# Patient Record
Sex: Female | Born: 1940 | Race: White | Hispanic: No | Smoking: Never smoker
Health system: Southern US, Community
[De-identification: ages and names within clinical notes are randomized; demographics above are authoritative.]

## PROBLEM LIST (undated history)

## (undated) DIAGNOSIS — I10 Essential (primary) hypertension: Secondary | ICD-10-CM

## (undated) DIAGNOSIS — I4891 Unspecified atrial fibrillation: Secondary | ICD-10-CM

## (undated) DIAGNOSIS — R51 Headache: Secondary | ICD-10-CM

## (undated) DIAGNOSIS — T7840XA Allergy, unspecified, initial encounter: Secondary | ICD-10-CM

## (undated) DIAGNOSIS — F0390 Unspecified dementia without behavioral disturbance: Secondary | ICD-10-CM

## (undated) DIAGNOSIS — J302 Other seasonal allergic rhinitis: Secondary | ICD-10-CM

## (undated) DIAGNOSIS — R0600 Dyspnea, unspecified: Secondary | ICD-10-CM

## (undated) DIAGNOSIS — R42 Dizziness and giddiness: Secondary | ICD-10-CM

## (undated) DIAGNOSIS — N393 Stress incontinence (female) (male): Secondary | ICD-10-CM

## (undated) DIAGNOSIS — R600 Localized edema: Secondary | ICD-10-CM

## (undated) DIAGNOSIS — M8580 Other specified disorders of bone density and structure, unspecified site: Secondary | ICD-10-CM

## (undated) DIAGNOSIS — R0609 Other forms of dyspnea: Secondary | ICD-10-CM

## (undated) DIAGNOSIS — R002 Palpitations: Secondary | ICD-10-CM

## (undated) DIAGNOSIS — E785 Hyperlipidemia, unspecified: Secondary | ICD-10-CM

## (undated) DIAGNOSIS — K529 Noninfective gastroenteritis and colitis, unspecified: Secondary | ICD-10-CM

## (undated) DIAGNOSIS — E119 Type 2 diabetes mellitus without complications: Secondary | ICD-10-CM

## (undated) DIAGNOSIS — J449 Chronic obstructive pulmonary disease, unspecified: Secondary | ICD-10-CM

## (undated) HISTORY — DX: Other seasonal allergic rhinitis: J30.2

## (undated) HISTORY — PX: APPENDECTOMY: SHX54

## (undated) HISTORY — PX: UMBILICAL HERNIA REPAIR: SHX196

## (undated) HISTORY — PX: HAMMER TOE SURGERY: SHX385

## (undated) HISTORY — DX: Unspecified atrial fibrillation: I48.91

## (undated) HISTORY — DX: Essential (primary) hypertension: I10

## (undated) HISTORY — DX: Allergy, unspecified, initial encounter: T78.40XA

## (undated) HISTORY — DX: Localized edema: R60.0

## (undated) HISTORY — DX: Other specified disorders of bone density and structure, unspecified site: M85.80

## (undated) HISTORY — DX: Dizziness and giddiness: R42

## (undated) HISTORY — PX: CHOLECYSTECTOMY: SHX55

## (undated) HISTORY — DX: Chronic obstructive pulmonary disease, unspecified: J44.9

## (undated) HISTORY — PX: INCISIONAL HERNIA REPAIR: SHX193

## (undated) HISTORY — DX: Stress incontinence (female) (male): N39.3

## (undated) HISTORY — DX: Palpitations: R00.2

## (undated) HISTORY — DX: Noninfective gastroenteritis and colitis, unspecified: K52.9

---

## 2000-05-02 HISTORY — PX: BREAST BIOPSY: SHX20

## 2000-08-15 ENCOUNTER — Encounter: Payer: Self-pay | Admitting: Family Medicine

## 2000-08-15 ENCOUNTER — Ambulatory Visit (HOSPITAL_COMMUNITY): Admission: RE | Admit: 2000-08-15 | Discharge: 2000-08-15 | Payer: Self-pay | Admitting: Family Medicine

## 2000-08-23 ENCOUNTER — Other Ambulatory Visit: Admission: RE | Admit: 2000-08-23 | Discharge: 2000-08-23 | Payer: Self-pay | Admitting: Neurology

## 2000-08-23 ENCOUNTER — Encounter: Payer: Self-pay | Admitting: Family Medicine

## 2000-08-23 ENCOUNTER — Ambulatory Visit (HOSPITAL_COMMUNITY): Admission: RE | Admit: 2000-08-23 | Discharge: 2000-08-23 | Payer: Self-pay | Admitting: Family Medicine

## 2000-09-19 ENCOUNTER — Ambulatory Visit (HOSPITAL_COMMUNITY): Admission: RE | Admit: 2000-09-19 | Discharge: 2000-09-19 | Payer: Self-pay | Admitting: *Deleted

## 2000-09-19 ENCOUNTER — Encounter: Payer: Self-pay | Admitting: *Deleted

## 2000-11-01 ENCOUNTER — Ambulatory Visit (HOSPITAL_COMMUNITY): Admission: RE | Admit: 2000-11-01 | Discharge: 2000-11-01 | Payer: Self-pay | Admitting: General Surgery

## 2001-02-28 ENCOUNTER — Ambulatory Visit (HOSPITAL_COMMUNITY): Admission: RE | Admit: 2001-02-28 | Discharge: 2001-02-28 | Payer: Self-pay | Admitting: Family Medicine

## 2001-02-28 ENCOUNTER — Encounter: Payer: Self-pay | Admitting: Family Medicine

## 2001-03-05 ENCOUNTER — Ambulatory Visit (HOSPITAL_COMMUNITY): Admission: RE | Admit: 2001-03-05 | Discharge: 2001-03-05 | Payer: Self-pay | Admitting: General Surgery

## 2001-03-05 ENCOUNTER — Encounter: Payer: Self-pay | Admitting: General Surgery

## 2001-08-15 ENCOUNTER — Encounter: Payer: Self-pay | Admitting: Family Medicine

## 2001-08-15 ENCOUNTER — Ambulatory Visit (HOSPITAL_COMMUNITY): Admission: RE | Admit: 2001-08-15 | Discharge: 2001-08-15 | Payer: Self-pay | Admitting: Family Medicine

## 2002-02-07 ENCOUNTER — Encounter: Payer: Self-pay | Admitting: Otolaryngology

## 2002-02-07 ENCOUNTER — Ambulatory Visit (HOSPITAL_COMMUNITY): Admission: RE | Admit: 2002-02-07 | Discharge: 2002-02-07 | Payer: Self-pay | Admitting: Otolaryngology

## 2002-08-22 ENCOUNTER — Ambulatory Visit (HOSPITAL_COMMUNITY): Admission: RE | Admit: 2002-08-22 | Discharge: 2002-08-22 | Payer: Self-pay | Admitting: Family Medicine

## 2002-08-22 ENCOUNTER — Encounter: Payer: Self-pay | Admitting: Family Medicine

## 2003-11-27 ENCOUNTER — Ambulatory Visit (HOSPITAL_COMMUNITY): Admission: RE | Admit: 2003-11-27 | Discharge: 2003-11-27 | Payer: Self-pay | Admitting: Family Medicine

## 2005-02-02 ENCOUNTER — Ambulatory Visit (HOSPITAL_COMMUNITY): Admission: RE | Admit: 2005-02-02 | Discharge: 2005-02-02 | Payer: Self-pay | Admitting: Family Medicine

## 2005-03-30 ENCOUNTER — Ambulatory Visit (HOSPITAL_COMMUNITY): Admission: RE | Admit: 2005-03-30 | Discharge: 2005-03-30 | Payer: Self-pay | Admitting: Family Medicine

## 2005-04-14 ENCOUNTER — Ambulatory Visit: Payer: Self-pay | Admitting: Internal Medicine

## 2005-05-02 HISTORY — PX: COLONOSCOPY: SHX174

## 2005-05-02 HISTORY — PX: KNEE ARTHROSCOPY W/ MENISCAL REPAIR: SHX1877

## 2005-07-19 ENCOUNTER — Ambulatory Visit (HOSPITAL_COMMUNITY): Admission: RE | Admit: 2005-07-19 | Discharge: 2005-07-19 | Payer: Self-pay | Admitting: Internal Medicine

## 2005-07-19 ENCOUNTER — Ambulatory Visit: Payer: Self-pay | Admitting: Internal Medicine

## 2006-04-05 ENCOUNTER — Ambulatory Visit (HOSPITAL_COMMUNITY): Admission: RE | Admit: 2006-04-05 | Discharge: 2006-04-05 | Payer: Self-pay | Admitting: Optometry

## 2006-04-09 ENCOUNTER — Emergency Department (HOSPITAL_COMMUNITY): Admission: EM | Admit: 2006-04-09 | Discharge: 2006-04-09 | Payer: Self-pay | Admitting: Emergency Medicine

## 2006-04-27 ENCOUNTER — Encounter: Admission: RE | Admit: 2006-04-27 | Discharge: 2006-04-27 | Payer: Self-pay | Admitting: Orthopedic Surgery

## 2006-05-03 ENCOUNTER — Ambulatory Visit (HOSPITAL_COMMUNITY): Admission: RE | Admit: 2006-05-03 | Discharge: 2006-05-03 | Payer: Self-pay | Admitting: Obstetrics and Gynecology

## 2006-05-19 ENCOUNTER — Ambulatory Visit (HOSPITAL_BASED_OUTPATIENT_CLINIC_OR_DEPARTMENT_OTHER): Admission: RE | Admit: 2006-05-19 | Discharge: 2006-05-19 | Payer: Self-pay | Admitting: Orthopedic Surgery

## 2006-06-08 ENCOUNTER — Encounter (HOSPITAL_COMMUNITY): Admission: RE | Admit: 2006-06-08 | Discharge: 2006-07-08 | Payer: Self-pay | Admitting: Orthopedic Surgery

## 2006-09-20 ENCOUNTER — Ambulatory Visit (HOSPITAL_BASED_OUTPATIENT_CLINIC_OR_DEPARTMENT_OTHER): Admission: RE | Admit: 2006-09-20 | Discharge: 2006-09-20 | Payer: Self-pay | Admitting: Orthopedic Surgery

## 2006-09-20 HISTORY — DX: Headache: R51

## 2006-09-20 HISTORY — DX: Type 2 diabetes mellitus without complications: E11.9

## 2006-09-20 HISTORY — DX: Other forms of dyspnea: R06.09

## 2006-09-20 HISTORY — DX: Dyspnea, unspecified: R06.00

## 2006-09-20 HISTORY — DX: Hyperlipidemia, unspecified: E78.5

## 2007-05-07 ENCOUNTER — Ambulatory Visit (HOSPITAL_COMMUNITY): Admission: RE | Admit: 2007-05-07 | Discharge: 2007-05-07 | Payer: Self-pay | Admitting: Family Medicine

## 2008-05-16 ENCOUNTER — Ambulatory Visit (HOSPITAL_COMMUNITY): Admission: RE | Admit: 2008-05-16 | Discharge: 2008-05-16 | Payer: Self-pay | Admitting: Family Medicine

## 2009-05-18 ENCOUNTER — Ambulatory Visit (HOSPITAL_COMMUNITY)
Admission: RE | Admit: 2009-05-18 | Discharge: 2009-05-18 | Payer: Self-pay | Source: Home / Self Care | Admitting: Family Medicine

## 2010-05-21 ENCOUNTER — Ambulatory Visit (HOSPITAL_COMMUNITY)
Admission: RE | Admit: 2010-05-21 | Discharge: 2010-05-21 | Payer: Self-pay | Source: Home / Self Care | Attending: Family Medicine | Admitting: Family Medicine

## 2010-05-23 ENCOUNTER — Encounter: Payer: Self-pay | Admitting: Optometry

## 2010-09-14 NOTE — Op Note (Signed)
Christine [name], Christine [name]               ACCOUNT NO.:  [id number]   MEDICAL RECORD NO.:  [id number]          PATIENT TYPE:  AMB   LOCATION:  [facility]                         FACILITY:  [facility]   PHYSICIAN:  Marlowe Kays, M.D.  DATE OF BIRTH:  Aug 01, 1940   DATE OF PROCEDURE:  09/20/2006  DATE OF DISCHARGE:                               OPERATIVE REPORT   PREOPERATIVE DIAGNOSES:  1. Recurrent medial meniscus tear.  2. Osteoarthritis left knee.   POSTOPERATIVE DIAGNOSES:  1. Recurrent medial meniscus tear.  2. Osteoarthritis left knee.   OPERATION:  Left knee arthroscopy with  1. Partial medial meniscectomy.  2. Shaving of medial femoral condyle.  3. Debridement of lateral tibial plateau.   SURGEON:  Marlowe Kays, M.D.   ASSISTANT:  Nurse.   ANESTHESIA:  General.   PATHOLOGY AND JUSTIFICATION FOR PROCEDURE:  She had a history of prior  left knee arthroscopy, she reinjured her left knee and mid April and I  sent her for an MRI on 09/01/2006 demonstrating a stress or  insufficiency fraction of the medial tibia, severe degenerative changes  about the knee worse in the medial compartment and findings compatible  with a retear of residual medial meniscus.  All this was confirmed at  surgery.   PROCEDURE:  Satisfactory general anesthesia, time-out was performed.  Ace wrap to right lower extremity with knee support, left lower  extremity pneumatic tourniquet.  Leg was Esmarched out non sterilely and  thigh stabilizer applied.  Then prepped the leg from stabilizer to ankle  with DuraPrep draped in sterile field. Superior medial saline inflow  first through an anterolateral portal medial compartment knee joint was  evaluated.  She had some wear of the medial femoral condyle which I  gently shaved down the 3.5 shaver.  She had extensive retearing of the  inner portion of the entire posterior curve of the medial meniscus which  I trimmed back to stable rim with baskets and  shaved down  until smooth  with a 3.5 shaver.  Looking out at the medial gutter and suprapatellar  area, patella was worn but did not require shaving.  I then reversed  portals.  Laterally her lateral meniscus was intact.  She had some  disruption of the anterior portion of the lateral tibial plateau which  also initially looked like it might involve the lateral meniscus.  I  debrided this down until smooth with a 3.5 shaver.  Some of the wear  went through all the articular cartilage and the wear seemed to be  entirely related to the lateral tibial plateau.  The joint was then  irrigated to clear and all fluid possible removed.  Two anterior portals  were closed with 4-0 nylon.  I injected 20 mL half percent  Marcaine with adrenalin through the inflow apparatus which I then  removed and closed this portal was 4-0 nylon as well.  Betadine Adaptic  dry sterile dressing were applied.  Tourniquet was released.  She  tolerated the procedure well and was taken recovery satisfactory  condition with no known complications.  ______________________________  Marlowe Kays, M.D.     JA/MEDQ  D:  09/20/2006  T:  09/20/2006  Job:  [id number]

## 2010-09-17 NOTE — H&P (Signed)
[facility]  Patient:    Christine [name], Christine [name] Visit Number: [id number] MRN: [id number]          Service Type: OUT Location: RAD Attending Physician:  Patrica Duel Dictated by:   Franky Macho, M.D. Admit Date:  02/28/2001   CC:         Patrica Duel, M.D.   History and Physical  AGE: 70  CHIEF COMPLAINT:  Abnormal mammogram of the right breast.  HISTORY OF PRESENT ILLNESS:  The patient is a 70 year old white female who was referred for evaluation and treatment of an abnormal mammogram of the right breast.  Microcalcifications which have increased in number are noted in the upper, outer quadrant of the right breast.  No nipple discharge, mass, or family history of breast carcinoma is noted.  She was first pregnant at age 27, with two children.  She did breast feed.  PAST MEDICAL HISTORY 1. Extrinsic allergies. 2. Gastroesophageal reflux disease.  PAST SURGICAL HISTORY 1. Umbilical herniorrhaphy in July 2002. 2. Previous incisional herniorrhaphy.  CURRENT MEDICATIONS 1. Flonase. 2. Vioxx. 3. Prevacid. 4. Allegra.  ALLERGIES:  PENICILLIN AND CODEINE.  REVIEW OF SYSTEMS:  The patient denies drinking or smoking.  She denies any recent asthma problems.  She denies any cardiac difficulties.  PHYSICAL EXAMINATION  GENERAL:  The patient is a well-developed, well-nourished white female, in no acute distress.  VITAL SIGNS:  Afebrile, vital signs stable.  LUNGS:  Clear to auscultation with equal breath sounds bilaterally.  HEART:  Exam reveals a regular rate and rhythm without S3, S4, or murmurs.  ABDOMEN:  Soft, nontender, nondistended.  An umbilical hernia incision site is noted to be well-healed.  IMPRESSION:  Right breast mass, nonpalpable.  PLAN:  The patient is scheduled for a right breast biopsy after needle localization on March 05, 2001.  The risks and benefits of the procedure, including bleeding and infection, were fully  explained to the patient, who gave an informed consent. Dictated by:   Franky Macho, M.D. Attending Physician:  Patrica Duel DD:  03/01/01 TD:  03/01/01 Job: [id number]

## 2010-09-17 NOTE — Consult Note (Signed)
Christine [name], Christine [name]               ACCOUNT NO.:  [id number]   MEDICAL RECORD NO.:  [id number]          PATIENT TYPE:  AMB   LOCATION:                                FACILITY:  [facility]   PHYSICIAN:  R. Roetta Sessions, M.D. DATE OF BIRTH:  1940/11/05   DATE OF CONSULTATION:  04/14/2005  DATE OF DISCHARGE:                                   CONSULTATION   REFERRING PHYSICIAN:  Melvyn Novas, M.D.   REASON FOR CONSULTATION:  Constipation, abnormal barium enema with possible  polyp.   HISTORY OF PRESENT ILLNESS:  Christine [name] is a 70 year old, Caucasian female  with history of chronic constipation.  She recently had a air contrast  barium enema ordered by Dr. Oval Linsey.  She had scattered colonic  diverticulosis.  There was a questionable intraluminal defect in the  descending colon and polyp could not be excluded.  Colonoscopy was  recommended.  She has never had a prior colonoscopy.  She has had chronic  constipation most of her life.  She generally takes Correctol every 3 days  to facilitate bowel movements, otherwise she does not have any bowel  movement.  She denies any melena or rectal bleeding, no abdominal pain,  nausea or vomiting.  She has had mild acid reflux disease for about 5 years.  She has no difficulty swallowing as long as she takes her time to eat.  She  has never had an upper endoscopy.   CURRENT MEDICATIONS:  1.  Naprosyn 500 mg daily.  2.  Crestor 10 mg daily.  3.  Multivitamin daily.  4.  B-Complex daily.  5.  Vitamin A, B and C daily.  6.  Allergy shots every other week.  7.  Correctol every 3 days.  8.  Zantac 75 mg once to twice daily.   ALLERGIES:  CODEINE, PENICILLIN and MORPHINE.   PAST MEDICAL HISTORY:  1.  Arthritis.  2.  Hypercholesterolemia.   ALLERGIES:  MOLD and DUST.   PAST SURGICAL HISTORY:  1.  Cataract extraction bilaterally.  2.  Umbilical hernia repair twice.  3.  Cesarean section twice.  4.  Appendectomy.   FAMILY  HISTORY:  Unavailable.  The patient was an orphan.   SOCIAL HISTORY:  She is widowed.  She has one living child, one deceased  child.  She is employed at [employer].  She has never been a  smoker and no alcohol use.   REVIEW OF SYSTEMS:  GASTROINTESTINAL:  See HPI.  CONSTITUTIONAL:  No weight  loss.  CARDIOPULMONARY:  No chest pain or shortness of breath.   PHYSICAL EXAMINATION:  VITAL SIGNS:  Weight 203 pounds, height 5 feet 6  inches, temperature 97.8, blood pressure 126/76, pulse 68.  GENERAL:  Pleasant, mildly obese, Caucasian female in no acute distress.  SKIN:  Warm and dry, no jaundice.  HEENT:  Pupils equal round and reactive to light.  Conjunctivae are pink.  Sclerae nonicteric.  Oropharyngeal mucosa moist and pink.  No lesions,  erythema or exudate.  No lymphadenopathy or thyromegaly.  CHEST:  Lungs clear to auscultation.  CARDIAC:  Regular rate and rhythm, normal S1, S2, no murmurs, rubs or  gallops.  ABDOMEN:  Positive bowel sounds, soft, nontender, nondistended.  No  organomegaly or masses.  No rebound tenderness or guarding.  No abdominal  pain bruits or hernias.  EXTREMITIES:  No edema.   IMPRESSION:  Christine [name] is a 70 year old lady with a history of chronic  constipation.  Recent barium enema revealed diverticulosis and possible  descending colon polyp.  Colonoscopy is recommended for further evaluation.  I discussed risks, alternatives and benefits with the patient and she is  agreeable to proceed, but would like to wait until after the first of the  year.  In addition, she has chronic gastroesophageal reflux disease.  She  has some mild esophageal dysphagia if she does not take her time to eat.  I  have offered her an upper endoscopy with the possible esophageal dilatation,  but the patient adamant against having this done.   RECOMMENDATIONS:  1.  Colonoscopy some time first of the year, at the patient's request.  2.  Begin MiraLax 17 g daily p.r.n.  x1 refill.  3.  She may continue to use Correctol as needed/as before.   I would like to thank Dr. Oval Linsey for allowing [place] to take part in  the care of this patient.      Tana Coast, P.AJonathon Bellows, M.D.  Electronically Signed    LL/MEDQ  D:  04/14/2005  T:  04/14/2005  Job:  [id number]

## 2010-09-17 NOTE — Op Note (Signed)
Christine [name], Christine [name]               ACCOUNT NO.:  [id number]   MEDICAL RECORD NO.:  [id number]          PATIENT TYPE:  AMB   LOCATION:  [facility]                         [facility]ACILITY:  [facility]   PHYSICIAN:  Marlowe Kays, M.D.  DATE OF BIRTH:  Aug 26, 1940   DATE OF PROCEDURE:  05/19/2006  DATE OF DISCHARGE:                               OPERATIVE REPORT   PREOPERATIVE DIAGNOSES:  1. Torn medial and lateral menisci.  2. Osteoarthritis right knee.   POSTOPERATIVE DIAGNOSES:  1. Torn medial and lateral menisci.  2. Osteoarthritis right knee.   OPERATION:  Right knee arthroscopy with:  1. Partial and medial lateral meniscectomy.  2. Shaving of medial and lateral femoral condyles.   SURGEON:  Dr. Simonne Come   ASSISTANT:  Nurse.   ANESTHESIA:  General.   PATHOLOGY AND JUSTIFICATION FOR PROCEDURE:  She has had painful right  knee with an inability to fully extend the knee for 5 or more months.  The MRI has demonstrated torn medial and lateral menisci, and  consequently she is here today for the above-mentioned surgery.   PROCEDURE:  Satisfactory general anesthesia, pneumatic tourniquet.  Right leg was Esmarched out nonsterilely, thigh stabilizer applied, and  the right leg prepped with DuraPrep from stabilizer to the ankle and  draped in a sterile field.  Knee support and Ace wrap for left lower  extremity.  Superior and medial saline inflow.  First through an  anterolateral portal, the medial compartment of the knee joint was  evaluated.  There was a good bit of synovitis which I resected.  The  anterior third of the medial meniscus did appear to be intact.  She had  chondrocalcinosis involving the medial femoral condyle and the posterior  third of the medial meniscus which had extensive degenerative tearing.  I shaved down the medial meniscus until smooth with the 3.5 shaver and  then debrided back the torn medial meniscus to a stable rim and shaved  it down with 3.5 shaver as well.  I  tried to look up in the medial  gutter and suprapatellar area, but either this was scarring or  osteoarthritis or knee anatomy, I was unable to get into the  suprapatellar pouch.  Consequently, I reversed portals.  Looking  laterally, initially I had a great deal of difficulty seeing any normal-  looking joint.  Initially there appeared to be a free fragment  laterally.  When I was finally able to see my shaver and began cleaning  out the synovium, it became apparent that she had a severe tear along  the lateral meniscus margin with tissue in the intercondylar area which  at first I thought was ACL tear, but then I was able to separate out  this from the ACL, and it became clear that she had actually had a  bucket-handle tear of the lateral meniscus.  I shaved down the grade 2-  3/4 chondromalacia of the lateral femoral condyle with 3.5 shaver and  trimmed back the parent lateral meniscus with a combination of baskets  and arthroscopic scissors and shaving it down until smooth with the 3.5  shaver.  The intercondylar fragment of meniscus I removed with a  combination of basket, scissors, and the shaver as well.  Her ACL was  intact.  Final pictures were taken.  I then tried to get up in the  lateral gutter and suprapatellar area from this approach and was unable  to either.  Consequently, I shifted portals, and I switched the inflow  apparatus in the superior medial area down to the lateral portal and  then used this portal site for the scope.  She had some fairly  significant osteoarthritic spurring on the dorsum of the femur and wear  on the patella which did not appear to be intact and make better  shaving.  The knee joint was then irrigated until clear and all fluid  possible removed.  The superior medial and the anterior medial portals  were closed with 4-0 nylon through the inflow portal which was now the  anterior lateral portal, I injected 20 mL of 0.5% Marcaine with  adrenaline  but no morphine since she was allergic to MORPHINE.  This  portal was then closed with 4-0 nylon as well.  Betadine, Adaptic dry  dressing were applied.  Tourniquet was released.  She tolerated the  procedure well and was taken to the recovery room in satisfactory  condition with no known complications.           ______________________________  Marlowe Kays, M.D.     JA/MEDQ  D:  05/19/2006  T:  05/19/2006  Job:  [id number]

## 2010-09-17 NOTE — Op Note (Signed)
NAMEBREELEY, BISCHOF               ACCOUNT NO.:  [id number]   MEDICAL RECORD NO.:  [id number]          PATIENT TYPE:  AMB   LOCATION:  DAY                           FACILITY:  [facility]   PHYSICIAN:  R. Roetta Sessions, M.D. DATE OF BIRTH:  1941-04-20   DATE OF PROCEDURE:  07/19/2005  DATE OF DISCHARGE:                                 OPERATIVE REPORT   PROCEDURE:  Diagnostic colonoscopy.   INDICATIONS FOR PROCEDURE:  A 70 year old lady who saw Dr. Janna Arch recently  and underwent barium enema. Barium enema indication history reads blood in  stool, but patient says she has not had any Hemoccult testing, nor has she  had any blood per rectum. She is devoid of any GI tract symptoms. She has  never had her lower GI tract imaged prior to the BE. There is no family  history of colorectal neoplasia. BE revealed some scattered diverticula.  There was a questionable intraluminal filling defect in descending colon.  Colonoscopy is now being done. This approach has been discussed with the  patient at length. Potential risks, benefits, and alternatives have been  reviewed and questions answered. She is agreeable. Please see documentation  in the medical record.   PROCEDURE NOTE:  O2 saturation, blood pressure, pulse, and respirations were  monitored throughout the entire procedure. Conscious sedation with Versed  and Demerol in incremental doses.   INSTRUMENT:  Olympus video chip system.   FINDINGS:  Digital rectal exam revealed no abnormalities.   ENDOSCOPIC FINDINGS:  Prep was marginal.   Rectum:  Examination of the rectal mucosa including retroflexed view of the  anal verge revealed no abnormalities.   Colon:  Colonic mucosa was surveyed from the rectosigmoid junction through  the left, transverse, and right colon to the area of the appendiceal  orifice, ileocecal valve, and cecum. These structures were well seen and  photographed for the record. From this level, the scope was slowly  withdrawn, and all previously mentioned mucosal surfaces were again seen.  There was a thin coating of stool throughout the colon which had to be dealt  with. It was difficult to see all of the colonic mucosal surfaces in detail.  Copious washings were performed. The patient had scattered left sided  diverticula. However, the remainder of the colonic mucosa appeared normal.  The patient had a long tortuous colon requiring a number of maneuvers  including changing of the patient's position and external abdominal pressure  to reach the cecum. The patient tolerated the procedure well and was  reactive to endoscopy.   IMPRESSION:  Left sided diverticula. Long tortuous. Otherwise normal  appearing colon. Marginal prep compromised the exam.   RECOMMENDATIONS:  1.  Diverticulosis literature provided to Ms. Llera.  2.  Repeat screening colonoscopy in 10 years. Should the patient experience      any GI symptoms in the future, she is to call me.      Jonathon Bellows, M.D.  Electronically Signed     RMR/MEDQ  D:  07/19/2005  T:  07/20/2005  Job:  [id number]   cc:   Vickey Huger  DonDiego, MD  Fax: [phone]

## 2010-09-17 NOTE — Op Note (Signed)
[facility]  Patient:    Christine [name], Christine [name] Visit Number: [id number] MRN: [id number]          Service Type: DSU Location: DAY Attending Physician:  Dalia Heading Dictated by:   Franky Macho, M.D. Proc. Date: 03/05/01 Admit Date:  03/05/2001   CC:         Patrica Duel, M.D.   Operative Report  PATIENT AGE:  70 years old.  PREOPERATIVE DIAGNOSIS:  Right breast mass, nonpalpable.  POSTOPERATIVE DIAGNOSIS:  Right breast mass, nonpalpable.  OPERATION:  Right breast biopsy after needle localization.  SURGEON:  Franky Macho, M.D.  ANESTHESIA:  MAC.  INDICATIONS:  The patient is a 70 year old white female referred for evaluation and treatment of an abnormal mammogram of the right breast.  The risks and benefits of the procedure including bleeding and infection were fully explained to the patient who gave informed consent.  DESCRIPTION OF PROCEDURE:  The patient was placed in the supine position after undergoing right breast needle localization.  The right breast was prepped and draped using the usual sterile technique with Betadine; 1% Xylocaine was used for local anesthesia.  A lower curvilinear incision was made along the lateral areolar border. Dissection was taken down to the area of the tip of the needle.  The needle along with right breast biopsy area was removed and sent to pathology. Specimen radiography revealed microcalcifications that were suspicious to be within the specimen removed.  Any bleeding was controlled using Bovie electrocautery.  The skin was closed using a 4-0 Vicryl subcuticular suture. Steri-Strips and dry sterile dressings were applied.  All tape and needle counts were correct at the end of the procedure.  The patient was transferred to PACU in stable condition.  COMPLICATIONS:  None.  SPECIMEN:  Right breast biopsy.  ESTIMATED BLOOD LOSS:  Minimal. Dictated by:   Franky Macho, M.D. Attending Physician:   Dalia Heading DD:  03/05/01 TD:  03/06/01 Job: [id number]

## 2011-04-22 ENCOUNTER — Other Ambulatory Visit (HOSPITAL_COMMUNITY): Payer: Self-pay | Admitting: Family Medicine

## 2011-04-22 DIAGNOSIS — Z139 Encounter for screening, unspecified: Secondary | ICD-10-CM

## 2011-05-24 ENCOUNTER — Ambulatory Visit (HOSPITAL_COMMUNITY): Payer: Self-pay

## 2011-05-30 ENCOUNTER — Ambulatory Visit (HOSPITAL_COMMUNITY)
Admission: RE | Admit: 2011-05-30 | Discharge: 2011-05-30 | Disposition: A | Discharge status: Home / Self Care | Payer: Medicare Other | Source: Ambulatory Visit | Attending: Family Medicine | Admitting: Family Medicine

## 2011-05-30 DIAGNOSIS — Z1231 Encounter for screening mammogram for malignant neoplasm of breast: Secondary | ICD-10-CM | POA: Insufficient documentation

## 2011-05-30 DIAGNOSIS — Z139 Encounter for screening, unspecified: Secondary | ICD-10-CM

## 2011-08-01 DIAGNOSIS — I4891 Unspecified atrial fibrillation: Secondary | ICD-10-CM

## 2011-08-01 HISTORY — DX: Unspecified atrial fibrillation: I48.91

## 2011-08-31 ENCOUNTER — Encounter: Payer: Self-pay | Admitting: Cardiology

## 2011-09-01 ENCOUNTER — Encounter: Payer: Self-pay | Admitting: Cardiology

## 2011-09-02 ENCOUNTER — Ambulatory Visit (INDEPENDENT_AMBULATORY_CARE_PROVIDER_SITE_OTHER): Payer: Medicare Other | Admitting: Cardiology

## 2011-09-02 ENCOUNTER — Encounter: Payer: Self-pay | Admitting: Cardiology

## 2011-09-02 ENCOUNTER — Encounter: Payer: Medicare Other | Admitting: Cardiology

## 2011-09-02 VITALS — BP 128/87 | HR 97 | Resp 16 | Ht 65.0 in | Wt 190.0 lb

## 2011-09-02 DIAGNOSIS — E1165 Type 2 diabetes mellitus with hyperglycemia: Secondary | ICD-10-CM

## 2011-09-02 DIAGNOSIS — I4891 Unspecified atrial fibrillation: Secondary | ICD-10-CM

## 2011-09-02 DIAGNOSIS — I10 Essential (primary) hypertension: Secondary | ICD-10-CM

## 2011-09-02 DIAGNOSIS — E785 Hyperlipidemia, unspecified: Secondary | ICD-10-CM | POA: Insufficient documentation

## 2011-09-02 DIAGNOSIS — I5032 Chronic diastolic (congestive) heart failure: Secondary | ICD-10-CM | POA: Insufficient documentation

## 2011-09-02 DIAGNOSIS — E1151 Type 2 diabetes mellitus with diabetic peripheral angiopathy without gangrene: Secondary | ICD-10-CM | POA: Insufficient documentation

## 2011-09-02 MED ORDER — FUROSEMIDE 40 MG PO TABS: 20.0000 mg | ORAL_TABLET | Freq: Every day | ORAL | Status: DC

## 2011-09-02 MED ORDER — RIVAROXABAN 20 MG PO TABS: 1.0000 | ORAL_TABLET | Freq: Every day | ORAL | Status: DC

## 2011-09-02 MED ORDER — DILTIAZEM HCL ER COATED BEADS 120 MG PO CP24: 120.0000 mg | ORAL_CAPSULE | Freq: Every day | ORAL | Status: DC

## 2011-09-02 NOTE — Assessment & Plan Note (Addendum)
Atrial fibrillation is newly recognized, but the duration of her arrhythmia is uncertain.  It is possible that the onset of this arrhythmia was coincident with the onset of her symptoms approximately one month ago.  Heart rate is fairly well controlled due to the fact that she was already treated with a beta blocker.  In light of her current symptoms, a strategy of rate control may not be adequate for this nice woman.  She does not have any impressive findings of congestive heart failure to go along with her arrhythmia.  She does have significant risk for thromboembolism and will also likely undergo cardioversion.  Accordingly, anticoagulation will be initiated with rivaroxaban.  Low-dose diltiazem will be added to better control heart rate.

## 2011-09-02 NOTE — Progress Notes (Signed)
6 Min walk: Start: HR: 95          o2- 97% 2 Min- HR: 96            o2- 96% - HR- 95           o2-95%

## 2011-09-02 NOTE — Assessment & Plan Note (Signed)
Dyspnea on exertion may be related to the presence atrial fibrillation, inadequate control of heart rate or may reflect yet another cause.  Despite the presence of peripheral edema and exertional dyspnea, and mildly elevated BNP level, she does not appear to have significant congestive heart failure.  Diuretic dose will be increased to 40 mg per day to better control peripheral edema.

## 2011-09-02 NOTE — Patient Instructions (Addendum)
Your physician recommends that you schedule a follow-up appointment in: 2 weeks  Your physician has recommended you make the following change in your medication:  1 - START Rivaroxaban 20 mg daily 2 - START Diltiazem 120 mg daily 3 - INCREASE Lasix to 40 mg daily  Your physician recommends that you return for lab work in: Next Friday at [facility] across the street

## 2011-09-02 NOTE — Progress Notes (Signed)
Patient ID: Christine [name], female   DOB: 1940-05-21, 71 y.o.   MRN: [id number]  HPI: Initial cardiology evaluation performed at the kind request of Dr. Loney Hering for evaluation of dyspnea on exertion and peripheral edema.  A recent echocardiogram performed at [facility] was interpreted as showing mild left ventricular systolic dysfunction without significant valvular abnormalities.  Patient reports approximately one month history of progressive dyspnea on exertion and exercise intolerance.  She has noted palpitations at night that resolve spontaneously.  She has experienced no chest discomfort, lightheadedness or syncope.  Current Outpatient Prescriptions on File Prior to Visit  Medication Sig Dispense Refill  . albuterol (PROVENTIL HFA;VENTOLIN HFA) 108 (90 BASE) MCG/ACT inhaler Inhale 2 puffs into the lungs 4 (four) times daily.      Marland Kitchen alendronate (FOSAMAX) 70 MG tablet Take 70 mg by mouth every 7 (seven) days. Take with a full glass of water on an empty stomach.      Marland Kitchen atenolol (TENORMIN) 50 MG tablet Take 50 mg by mouth daily.      . cetirizine (ZYRTEC) 10 MG tablet Take 10 mg by mouth daily.      . cholecalciferol (VITAMIN D) 1000 UNITS tablet Take 1,000 Units by mouth daily.      . citalopram (CELEXA) 20 MG tablet Take 20 mg by mouth daily.      . furosemide (LASIX) 20 MG tablet Take 20 mg by mouth daily.      Marland Kitchen gabapentin (NEURONTIN) 300 MG capsule Take 300 mg by mouth 3 (three) times daily. 1 cap am, noon and 2 caps at bedtime      . glimepiride (AMARYL) 1 MG tablet Take 1 mg by mouth daily before breakfast.      . metFORMIN (GLUCOPHAGE) 500 MG tablet Take 500 mg by mouth daily with breakfast.      . omeprazole (PRILOSEC) 40 MG capsule Take 40 mg by mouth daily.      . pravastatin (PRAVACHOL) 40 MG tablet Take 40 mg by mouth daily.      . solifenacin (VESICARE) 5 MG tablet Take 5 mg by mouth daily.       Allergies  Allergen Reactions  . Ace Inhibitors Cough  . Codeine Hives  .  Morphine And Related Hives  . Penicillins Hives     Past Medical History  Diagnosis Date  . Seasonal allergies   . Headache     twice weekly  . Palpitations   . Hypertension   . Hyperlipidemia   . Vertigo   . Dyspnea on exertion     pedal edema  . Chronic diarrhea     diverticulosis  . Stress incontinence   . Diabetes mellitus, type 2     Diabetic neuropathy  . Bilateral lower extremity edema   . Osteopenia     DEXA scan 01/2010  . Gout     Past Surgical History  Procedure Date  . Cesarean section     x 2  . Appendectomy   . Umbilical hernia repair   . Knee arthroscopy w/ meniscal repair 2007    Bilateral  . Hammer toe surgery     Bilateral hammer toe amputation  . Incisional hernia repair   . Breast biopsy 2002    Right  . Colonoscopy 2007    Family History  Problem Relation Age of Onset  . Adopted: Yes    History   Social History  . Marital Status: Widowed    Spouse Name: N/A  Number of Children: 2  . Years of Education: N/A   Occupational History  . Therapist, sports    Social History Main Topics  . Smoking status: Never Smoker   . Smokeless tobacco: Never Used  . Alcohol Use: No  . Drug Use: No  . Sexually Active: Not on file   Other Topics Concern  . Not on file   Social History Narrative  . No narrative on file    ROS: Substantial psychologic stress at work; intermittent episodes of vertigo; recent headaches, stress incontinence, pedal edema times one month. All other systems reviewed and are negative.  PHYSICAL EXAM: BP 128/87  Pulse 97  Resp 16  Ht 5\' 5"  (1.651 m)  Wt 86.183 kg (190 lb)  BMI 31.62 kg/m2  General-Well-developed; no acute distress Body Habitus-Overweight HEENT-[place]/AT; PERRL; EOM intact; conjunctiva and lids nl Neck-No JVD; no carotid bruits Endocrine-No thyromegaly Lungs-Clear lung fields; resonant percussion; normal I-to-E ratio Cardiovascular- normal PMI; normal S1 and S2; irregular; modest systolic ejection  murmur Abdomen-BS normal; soft and non-tender without masses or organomegaly Musculoskeletal-No deformities, cyanosis or clubbing Neurologic-Nl cranial nerves; symmetric strength and tone Skin- Warm, no significant lesions Extremities-Nl distal pulses; 1+ edema to the mid calf  Echocardiogram:  LV-normal size, no LVH, global hypokinesis with EF of 40-45%.  Markedly enlarged left atrium.  Mild to moderately dilated right ventricle.  Moderate RA enlargement.  AV sclerosis.  Mildly thickened mitral valve with moderate MR.  Mild pulmonary hypertension.  Dilated IVC.  Performed at [facility] and interpreted by Dr. Titus Mould.  EKG: Atrial fibrillation with a ventricular rate of 104, low voltage, Minor nonspecific ST-T wave abnormality in the inferior leads.  No previous tracing for comparison.  ASSESSMENT AND PLAN:   Bing, MD 09/02/2011 3:08 PM

## 2011-09-04 NOTE — Progress Notes (Signed)
This encounter was created in error - please disregard.

## 2011-09-04 NOTE — Assessment & Plan Note (Signed)
Adequately controlled at this visit.

## 2011-09-04 NOTE — Assessment & Plan Note (Signed)
Results of a lipid profile are being sought.

## 2011-09-08 ENCOUNTER — Encounter: Payer: Self-pay | Admitting: *Deleted

## 2011-09-09 LAB — CBC
HCT: 39.9 % (ref 36.0–46.0)
Hemoglobin: 12.7 g/dL (ref 12.0–15.0)
MCH: 29.5 pg (ref 26.0–34.0)
MCHC: 31.8 g/dL (ref 30.0–36.0)
MCV: 92.8 fL (ref 78.0–100.0)
Platelets: 225 10*3/uL (ref 150–400)
RBC: 4.3 MIL/uL (ref 3.87–5.11)
RDW: 13.6 % (ref 11.5–15.5)
WBC: 6.3 10*3/uL (ref 4.0–10.5)

## 2011-09-09 LAB — COMPREHENSIVE METABOLIC PANEL
ALT: 30 U/L (ref 0–35)
AST: 26 U/L (ref 0–37)
BUN: 19 mg/dL (ref 6–23)
CO2: 27 mEq/L (ref 19–32)
Calcium: 9.7 mg/dL (ref 8.4–10.5)
Chloride: 103 mEq/L (ref 96–112)
Creat: 1.02 mg/dL (ref 0.50–1.10)
Glucose, Bld: 145 mg/dL — ABNORMAL HIGH (ref 70–99)
Potassium: 4.7 mEq/L (ref 3.5–5.3)
Sodium: 141 mEq/L (ref 135–145)
Total Protein: 6.9 g/dL (ref 6.0–8.3)

## 2011-09-09 LAB — TSH: TSH: 2.936 u[IU]/mL (ref 0.350–4.500)

## 2011-09-12 ENCOUNTER — Encounter: Payer: Self-pay | Admitting: *Deleted

## 2011-09-21 ENCOUNTER — Ambulatory Visit (INDEPENDENT_AMBULATORY_CARE_PROVIDER_SITE_OTHER): Payer: Medicare Other | Admitting: Cardiology

## 2011-09-21 ENCOUNTER — Encounter: Payer: Self-pay | Admitting: Cardiology

## 2011-09-21 VITALS — BP 130/80 | HR 96 | Ht 65.0 in | Wt 189.0 lb

## 2011-09-21 DIAGNOSIS — R0989 Other specified symptoms and signs involving the circulatory and respiratory systems: Secondary | ICD-10-CM

## 2011-09-21 DIAGNOSIS — I4891 Unspecified atrial fibrillation: Secondary | ICD-10-CM

## 2011-09-21 DIAGNOSIS — I10 Essential (primary) hypertension: Secondary | ICD-10-CM

## 2011-09-21 DIAGNOSIS — E785 Hyperlipidemia, unspecified: Secondary | ICD-10-CM

## 2011-09-21 DIAGNOSIS — Z7901 Long term (current) use of anticoagulants: Secondary | ICD-10-CM

## 2011-09-21 DIAGNOSIS — R0602 Shortness of breath: Secondary | ICD-10-CM

## 2011-09-21 MED ORDER — ATENOLOL 100 MG PO TABS: 100.0000 mg | ORAL_TABLET | Freq: Every day | ORAL | Status: DC

## 2011-09-21 NOTE — Progress Notes (Signed)
Patient ID: Christine [name], female   DOB: 05-23-1940, 71 y.o.   MRN: [id number]  HPI: Scheduled return visit for this very nice woman with newly diagnosed atrial fibrillation.  Since seeing her last week, she is substantially improved, reporting no weakness, malaise or exercise intolerance.  Edema is less, but persists.  She consumes a low-salt diet and maintain leg elevation in her recliner when possible.    Prior to Admission medications   Medication Sig Start Date End Date Taking? Authorizing Provider  albuterol (PROVENTIL HFA;VENTOLIN HFA) 108 (90 BASE) MCG/ACT inhaler Inhale 2 puffs into the lungs 4 (four) times daily.   Yes Historical Provider, MD  alendronate (FOSAMAX) 70 MG tablet Take 70 mg by mouth every 7 (seven) days. Take with a full glass of water on an empty stomach.   Yes Historical Provider, MD  atenolol (TENORMIN) 100 MG tablet Take 1 tablet (100 mg total) by mouth daily. 09/21/11  Yes Kathlen Brunswick, MD  cetirizine (ZYRTEC) 10 MG tablet Take 10 mg by mouth daily.   Yes Historical Provider, MD  cholecalciferol (VITAMIN D) 1000 UNITS tablet Take 1,000 Units by mouth daily.   Yes Historical Provider, MD  citalopram (CELEXA) 20 MG tablet Take 20 mg by mouth daily.   Yes Historical Provider, MD  furosemide (LASIX) 40 MG tablet Take 0.5 tablets (20 mg total) by mouth daily. 09/02/11  Yes Kathlen Brunswick, MD  gabapentin (NEURONTIN) 300 MG capsule Take 300 mg by mouth 3 (three) times daily. 1 cap am, noon and 2 caps at bedtime   Yes Historical Provider, MD  glimepiride (AMARYL) 1 MG tablet Take 1 mg by mouth daily before breakfast.   Yes Historical Provider, MD  metFORMIN (GLUCOPHAGE) 500 MG tablet Take 500 mg by mouth daily with breakfast.   Yes Historical Provider, MD  omeprazole (PRILOSEC) 40 MG capsule Take 40 mg by mouth daily.   Yes Historical Provider, MD  pravastatin (PRAVACHOL) 40 MG tablet Take 40 mg by mouth daily.   Yes Historical Provider, MD  Rivaroxaban 20 MG TABS Take 1  tablet by mouth daily. 09/02/11  Yes Kathlen Brunswick, MD  solifenacin (VESICARE) 5 MG tablet Take 5 mg by mouth daily.   Yes Historical Provider, MD   Allergies  Allergen Reactions  . Ace Inhibitors Cough  . Codeine Hives  . Morphine And Related Hives  . Penicillins Hives     Past medical history, social history, and family history reviewed and updated.  ROS: Denies chest discomfort, palpitations, lightheadedness or syncope.  Sedentary lifestyle without dyspnea on exertion.  All other systems reviewed and are negative.  PHYSICAL EXAM: BP 130/80  Pulse 96  Ht 5\' 5"  (1.651 m)  Wt 85.73 kg (189 lb)  BMI 31.45 kg/m2  SpO2 96%; Weight decreased 1 pound since last visit General-Well developed; no acute distress Body habitus-proportionate weight and height Neck-No JVD; no carotid bruits Lungs-clear lung fields; resonant to percussion Cardiovascular-normal PMI; normal S1 and S2; irregular and rapid; modest early systolic ejection murmur Abdomen-normal bowel sounds; soft and non-tender without masses or organomegaly Musculoskeletal-No deformities, no cyanosis or clubbing Neurologic-Normal cranial nerves; symmetric strength and tone Skin-Warm, no significant lesions Extremities-distal pulses intact; no edema  Rhythm Strip (6 minute walk): Traversed 500 feet and stopped with dyspnea and fatigue.  ASSESSMENT AND PLAN:  Bratenahl Bing, MD 09/21/2011 3:22 PM

## 2011-09-21 NOTE — Progress Notes (Deleted)
Name: Christine [name]    DOB: 26-Jul-1940  Age: 71 y.o.  MR#: [id number]       PCP:  Ernestine Conrad, MD, MD      Insurance: @PAYORNAME @   CC:    Chief Complaint  Patient presents with  . No complaints - Feels much better and edema has improved mar    2 week follow up    VS BP 130/80  Pulse 96  Ht 5\' 5"  (1.651 m)  Wt 189 lb (85.73 kg)  BMI 31.45 kg/m2  SpO2 96%  Weights Current Weight  09/21/11 189 lb (85.73 kg)  09/02/11 190 lb (86.183 kg)    Blood Pressure  BP Readings from Last 3 Encounters:  09/21/11 130/80  09/02/11 128/87     Admit date:  (Not on file) Last encounter with RMR:  09/02/2011   Allergy Allergies  Allergen Reactions  . Ace Inhibitors Cough  . Codeine Hives  . Morphine And Related Hives  . Penicillins Hives    Current Outpatient Prescriptions  Medication Sig Dispense Refill  . albuterol (PROVENTIL HFA;VENTOLIN HFA) 108 (90 BASE) MCG/ACT inhaler Inhale 2 puffs into the lungs 4 (four) times daily.      Marland Kitchen alendronate (FOSAMAX) 70 MG tablet Take 70 mg by mouth every 7 (seven) days. Take with a full glass of water on an empty stomach.      Marland Kitchen atenolol (TENORMIN) 50 MG tablet Take 50 mg by mouth daily.      . cetirizine (ZYRTEC) 10 MG tablet Take 10 mg by mouth daily.      . cholecalciferol (VITAMIN D) 1000 UNITS tablet Take 1,000 Units by mouth daily.      . citalopram (CELEXA) 20 MG tablet Take 20 mg by mouth daily.      Marland Kitchen diltiazem (CARDIZEM CD) 120 MG 24 hr capsule Take 1 capsule (120 mg total) by mouth daily.  30 capsule  12  . furosemide (LASIX) 40 MG tablet Take 0.5 tablets (20 mg total) by mouth daily.  30 tablet  12  . gabapentin (NEURONTIN) 300 MG capsule Take 300 mg by mouth 3 (three) times daily. 1 cap am, noon and 2 caps at bedtime      . glimepiride (AMARYL) 1 MG tablet Take 1 mg by mouth daily before breakfast.      . metFORMIN (GLUCOPHAGE) 500 MG tablet Take 500 mg by mouth daily with breakfast.      . omeprazole (PRILOSEC) 40 MG capsule Take 40  mg by mouth daily.      . pravastatin (PRAVACHOL) 40 MG tablet Take 40 mg by mouth daily.      . Rivaroxaban 20 MG TABS Take 1 tablet by mouth daily.  30 tablet  12  . solifenacin (VESICARE) 5 MG tablet Take 5 mg by mouth daily.        Discontinued Meds:   There are no discontinued medications.  Patient Active Problem List  Diagnoses  . Hypertension  . Hyperlipidemia  . Dyspnea on exertion  . Diabetes mellitus, type 2  . Atrial fibrillation    LABS Office Visit on 09/02/2011  Component Date Value  . TSH 09/09/2011 2.936   . WBC 09/09/2011 6.3   . RBC 09/09/2011 4.30   . Hemoglobin 09/09/2011 12.7   . HCT 09/09/2011 39.9   . MCV 09/09/2011 92.8   . MCH 09/09/2011 29.5   . MCHC 09/09/2011 31.8   . RDW 09/09/2011 13.6   . Platelets 09/09/2011 225   .  Sodium 09/09/2011 141   . Potassium 09/09/2011 4.7   . Chloride 09/09/2011 103   . CO2 09/09/2011 27   . Glucose, Bld 09/09/2011 145*  . BUN 09/09/2011 19   . Creat 09/09/2011 1.02   . Total Bilirubin 09/09/2011 0.5   . Alkaline Phosphatase 09/09/2011 64   . AST 09/09/2011 26   . ALT 09/09/2011 30   . Total Protein 09/09/2011 6.9   . Albumin 09/09/2011 4.0   . Calcium 09/09/2011 9.7      Results for this Opt Visit:     Results for orders placed in visit on 09/02/11  TSH      Component Value Range   TSH 2.936  0.350 - 4.500 (uIU/mL)  CBC      Component Value Range   WBC 6.3  4.0 - 10.5 (K/uL)   RBC 4.30  3.87 - 5.11 (MIL/uL)   Hemoglobin 12.7  12.0 - 15.0 (g/dL)   HCT 16.1  09.6 - 04.5 (%)   MCV 92.8  78.0 - 100.0 (fL)   MCH 29.5  26.0 - 34.0 (pg)   MCHC 31.8  30.0 - 36.0 (g/dL)   RDW 40.9  81.1 - 91.4 (%)   Platelets 225  150 - 400 (K/uL)  COMPREHENSIVE METABOLIC PANEL      Component Value Range   Sodium 141  135 - 145 (mEq/L)   Potassium 4.7  3.5 - 5.3 (mEq/L)   Chloride 103  96 - 112 (mEq/L)   CO2 27  19 - 32 (mEq/L)   Glucose, Bld 145 (*) 70 - 99 (mg/dL)   BUN 19  6 - 23 (mg/dL)   Creat 7.82  9.56 -  1.10 (mg/dL)   Total Bilirubin 0.5  0.3 - 1.2 (mg/dL)   Alkaline Phosphatase 64  39 - 117 (U/L)   AST 26  0 - 37 (U/L)   ALT 30  0 - 35 (U/L)   Total Protein 6.9  6.0 - 8.3 (g/dL)   Albumin 4.0  3.5 - 5.2 (g/dL)   Calcium 9.7  8.4 - 21.3 (mg/dL)    EKG Orders placed in visit on 09/02/11  . EKG 12-LEAD     Prior Assessment and Plan Problem List as of 09/21/2011          Cardiology Problems   Hypertension   Last Assessment & Plan Note   09/02/2011 Office Visit Signed 09/04/2011  7:05 PM by Kathlen Brunswick, MD    Adequately controlled at this visit.    Hyperlipidemia   Last Assessment & Plan Note   09/02/2011 Office Visit Signed 09/04/2011  7:04 PM by Kathlen Brunswick, MD    Results of a lipid profile are being sought.    Atrial fibrillation   Last Assessment & Plan Note   09/02/2011 Office Visit Addendum 09/04/2011  7:03 PM by Kathlen Brunswick, MD    Atrial fibrillation is newly recognized, but the duration of her arrhythmia is uncertain.  It is possible that the onset of this arrhythmia was coincident with the onset of her symptoms approximately one month ago.  Heart rate is fairly well controlled due to the fact that she was already treated with a beta blocker.  In light of her current symptoms, a strategy of rate control may not be adequate for this nice woman.  She does not have any impressive findings of congestive heart failure to go along with her arrhythmia.  She does have significant risk for thromboembolism and will  also likely undergo cardioversion.  Accordingly, anticoagulation will be initiated with rivaroxaban.  Low-dose diltiazem will be added to better control heart rate.      Other   Dyspnea on exertion   Last Assessment & Plan Note   09/02/2011 Office Visit Signed 09/02/2011  7:48 PM by Kathlen Brunswick, MD    Dyspnea on exertion may be related to the presence atrial fibrillation, inadequate control of heart rate or may reflect yet another cause.  Despite the presence of  peripheral edema and exertional dyspnea, and mildly elevated BNP level, she does not appear to have significant congestive heart failure.  Diuretic dose will be increased to 40 mg per day to better control peripheral edema.    Diabetes mellitus, type 2       Imaging: No results found.   FRS Calculation: Score not calculated. Missing: Total Cholesterol, HDL

## 2011-09-21 NOTE — Assessment & Plan Note (Addendum)
PT and PTT will be obtained to document anticoagulant effect.

## 2011-09-21 NOTE — Assessment & Plan Note (Addendum)
Pedal edema is not markedly improved, but patient is more satisfied, and weight has decreased 1 pound.  We will continue furosemide 40 mg per day for now.  Dyspnea on exertion is stable to improved.  A BNP level will be obtained to further assess for possible congestive heart failure.  Patient encouraged to increase her level of activity.

## 2011-09-21 NOTE — Assessment & Plan Note (Addendum)
Patient is symptomatically improved despite having a higher ventricular rate after starting diltiazem than was initially present.  There is a substantial increase in heart rate with exertion, but nothing excessive.  Exercise tolerance is impaired, likely due to physical deconditioning.  Atenolol will be increased to 100 mg per day and diltiazem discontinued.  Patient is advised to walk on a daily basis.  She will return in one week for assessment of heart rate and blood pressure and adjustment of medications by the cardiology nurses and to see me in one month.

## 2011-09-21 NOTE — Assessment & Plan Note (Signed)
Blood pressure has been normal with current medications and should improve further as we Increase the dose of AV nodal blocking agents.

## 2011-09-21 NOTE — Patient Instructions (Signed)
Your physician recommends that you schedule a follow-up appointment in:  1 - 1 month follow up 2 - Check up in 1 week with a rhythm strip  Your physician has recommended you make the following change in your medication:  1 - INCREASE Atenolol to 100 mg daily 2 - STOP Diltiazem  Your physician recommends that you return for lab work in: Today

## 2011-09-21 NOTE — Assessment & Plan Note (Signed)
Fasting lipid profile was ordered, but not performed by the lab.  We will submit another request for that study.

## 2011-09-22 LAB — LIPID PANEL
Cholesterol: 153 mg/dL (ref 0–200)
HDL: 47 mg/dL (ref >39)
LDL Cholesterol: 76 mg/dL (ref 0–99)
Total CHOL/HDL Ratio: 3.3 Ratio
Triglycerides: 148 mg/dL (ref <150)
VLDL: 30 mg/dL (ref 0–40)

## 2011-09-27 ENCOUNTER — Encounter: Payer: Self-pay | Admitting: *Deleted

## 2011-10-25 ENCOUNTER — Other Ambulatory Visit: Payer: Self-pay | Admitting: Cardiology

## 2011-11-03 ENCOUNTER — Emergency Department (HOSPITAL_COMMUNITY): Payer: Medicare Other

## 2011-11-03 ENCOUNTER — Inpatient Hospital Stay (HOSPITAL_COMMUNITY)
Admission: EM | Admit: 2011-11-03 | Discharge: 2011-11-08 | DRG: 308 | Disposition: A | Discharge status: Home / Self Care | Payer: Medicare Other | Attending: Internal Medicine | Admitting: Internal Medicine

## 2011-11-03 ENCOUNTER — Encounter (HOSPITAL_COMMUNITY): Payer: Self-pay | Admitting: Emergency Medicine

## 2011-11-03 DIAGNOSIS — Z7901 Long term (current) use of anticoagulants: Secondary | ICD-10-CM

## 2011-11-03 DIAGNOSIS — R0609 Other forms of dyspnea: Secondary | ICD-10-CM

## 2011-11-03 DIAGNOSIS — I10 Essential (primary) hypertension: Secondary | ICD-10-CM

## 2011-11-03 DIAGNOSIS — Z91199 Patient's noncompliance with other medical treatment and regimen due to unspecified reason: Secondary | ICD-10-CM

## 2011-11-03 DIAGNOSIS — E785 Hyperlipidemia, unspecified: Secondary | ICD-10-CM

## 2011-11-03 DIAGNOSIS — R0600 Dyspnea, unspecified: Secondary | ICD-10-CM

## 2011-11-03 DIAGNOSIS — Z886 Allergy status to analgesic agent status: Secondary | ICD-10-CM

## 2011-11-03 DIAGNOSIS — Z880 Allergy status to penicillin: Secondary | ICD-10-CM

## 2011-11-03 DIAGNOSIS — I5043 Acute on chronic combined systolic (congestive) and diastolic (congestive) heart failure: Secondary | ICD-10-CM

## 2011-11-03 DIAGNOSIS — R0602 Shortness of breath: Secondary | ICD-10-CM

## 2011-11-03 DIAGNOSIS — I4891 Unspecified atrial fibrillation: Principal | ICD-10-CM

## 2011-11-03 DIAGNOSIS — I509 Heart failure, unspecified: Secondary | ICD-10-CM | POA: Diagnosis present

## 2011-11-03 DIAGNOSIS — J811 Chronic pulmonary edema: Secondary | ICD-10-CM

## 2011-11-03 DIAGNOSIS — Z885 Allergy status to narcotic agent status: Secondary | ICD-10-CM

## 2011-11-03 DIAGNOSIS — E1151 Type 2 diabetes mellitus with diabetic peripheral angiopathy without gangrene: Secondary | ICD-10-CM | POA: Diagnosis present

## 2011-11-03 DIAGNOSIS — I5021 Acute systolic (congestive) heart failure: Secondary | ICD-10-CM

## 2011-11-03 DIAGNOSIS — E1149 Type 2 diabetes mellitus with other diabetic neurological complication: Secondary | ICD-10-CM | POA: Diagnosis present

## 2011-11-03 DIAGNOSIS — Z9119 Patient's noncompliance with other medical treatment and regimen: Secondary | ICD-10-CM

## 2011-11-03 DIAGNOSIS — E1142 Type 2 diabetes mellitus with diabetic polyneuropathy: Secondary | ICD-10-CM | POA: Diagnosis present

## 2011-11-03 DIAGNOSIS — E119 Type 2 diabetes mellitus without complications: Secondary | ICD-10-CM

## 2011-11-03 LAB — CBC WITH DIFFERENTIAL/PLATELET
Basophils Absolute: 0 10*3/uL (ref 0.0–0.1)
HCT: 38.2 % (ref 36.0–46.0)
Hemoglobin: 12.3 g/dL (ref 12.0–15.0)
Lymphocytes Relative: 23 % (ref 12–46)
Lymphs Abs: 1.6 10*3/uL (ref 0.7–4.0)
Monocytes Absolute: 0.6 10*3/uL (ref 0.1–1.0)
Monocytes Relative: 10 % (ref 3–12)
Neutro Abs: 4.3 10*3/uL (ref 1.7–7.7)
RBC: 4.17 MIL/uL (ref 3.87–5.11)
RDW: 14.1 % (ref 11.5–15.5)
WBC: 6.7 10*3/uL (ref 4.0–10.5)

## 2011-11-03 LAB — BASIC METABOLIC PANEL
CO2: 25 mEq/L (ref 19–32)
Chloride: 106 mEq/L (ref 96–112)
Creatinine, Ser: 0.71 mg/dL (ref 0.50–1.10)

## 2011-11-03 LAB — PRO B NATRIURETIC PEPTIDE: Pro B Natriuretic peptide (BNP): 6519 pg/mL — ABNORMAL HIGH (ref 0–125)

## 2011-11-03 MED ORDER — DILTIAZEM HCL 25 MG/5ML IV SOLN
10.0000 mg | Freq: Once | INTRAVENOUS | Status: AC
  Administered 2011-11-03: 10 mg via INTRAVENOUS
  Filled 2011-11-03: qty 5

## 2011-11-03 MED ORDER — SODIUM CHLORIDE 0.9 % IV BOLUS (SEPSIS)
250.0000 mL | Freq: Once | INTRAVENOUS | Status: AC
  Administered 2011-11-03: 250 mL via INTRAVENOUS

## 2011-11-03 MED ORDER — ONDANSETRON HCL 4 MG/2ML IJ SOLN
4.0000 mg | Freq: Once | INTRAMUSCULAR | Status: DC
  Filled 2011-11-03: qty 2

## 2011-11-03 MED ORDER — SODIUM CHLORIDE 0.9 % IV SOLN: INTRAVENOUS | Status: DC

## 2011-11-03 MED ORDER — DILTIAZEM HCL 25 MG/5ML IV SOLN: 20.0000 mg | Freq: Once | INTRAVENOUS | Status: DC

## 2011-11-03 MED ORDER — HYDROMORPHONE HCL PF 1 MG/ML IJ SOLN: 1.0000 mg | Freq: Once | INTRAMUSCULAR | Status: DC

## 2011-11-03 MED ORDER — DILTIAZEM HCL 25 MG/5ML IV SOLN
10.0000 mg | Freq: Once | INTRAVENOUS | Status: AC
  Administered 2011-11-03: 10 mg via INTRAVENOUS

## 2011-11-03 NOTE — ED Notes (Signed)
Patient c/o shortness of breath x 4 days; states got worse today.  Patient with pursed lip breathing at triage.  Patient speaking in short words.

## 2011-11-03 NOTE — ED Provider Notes (Addendum)
History  Scribed for Shelda Jakes, MD, the patient was seen in room APA18/APA18. This chart was scribed by Candelaria Stagers. The patient's care started at 9:55 PM   CSN: [id number]  Arrival date & time 11/03/11  2136   None     Chief Complaint  Patient presents with  . Shortness of Breath     The history is provided by the patient.   Christine [name] is a 71 y.o. female who presents to the Emergency Department complaining ofrapid heart rate and SOB that started earlier this morning and has continued all day.  Pt has experienced episodes of Afib before with the last episode about one week ago that lasted about 12 hours.  Pt began seeing Dr. Dietrich Pates May 31 for the rapid heart rate and was put on medication.  She denies nausea, vomiting, abdominal pain, or back pain.  Nothing seems to make the sx better or worse.  She has h/o diabetes.         [facility] PCP Past Medical History  Diagnosis Date  . Seasonal allergies   . Headache     twice weekly  . Palpitations   . Hypertension   . Hyperlipidemia   . Vertigo   . Dyspnea on exertion     pedal edema  . Chronic diarrhea     diverticulosis  . Stress incontinence   . Diabetes mellitus, type 2     Diabetic neuropathy  . Bilateral lower extremity edema   . Osteopenia     DEXA scan 01/2010  . Gout   . Atrial fibrillation 08/2011    First diagnosed in 08/2011; duration of arrhythmia is uncertain    Past Surgical History  Procedure Date  . Cesarean section     x 2  . Appendectomy   . Umbilical hernia repair   . Knee arthroscopy w/ meniscal repair 2007    Bilateral  . Hammer toe surgery     Bilateral hammer toe amputation  . Incisional hernia repair   . Breast biopsy 2002    Right  . Colonoscopy 2007    Family History  Problem Relation Age of Onset  . Adopted: Yes    History  Substance Use Topics  . Smoking status: Never Smoker   . Smokeless tobacco: Never Used  . Alcohol Use: No    OB  History    Grav Para Term Preterm Abortions TAB SAB Ect Mult Living                  Review of Systems  Constitutional: Negative for fever.  HENT: Negative for congestion and sore throat.   Respiratory: Positive for shortness of breath. Negative for cough.   Cardiovascular: Positive for leg swelling. Negative for chest pain.  Gastrointestinal: Negative for nausea, vomiting and abdominal pain.  Musculoskeletal: Negative for back pain.  Skin: Negative for rash.  Neurological: Negative for syncope and headaches.    Allergies  Ace inhibitors; Codeine; Morphine and related; and Penicillins  Home Medications   Current Outpatient Rx  Name Route Sig Dispense Refill  . ATENOLOL 100 MG PO TABS Oral Take 1 tablet (100 mg total) by mouth daily. 30 tablet 12  . CETIRIZINE HCL 10 MG PO TABS Oral Take 10 mg by mouth daily.    Marland Kitchen VITAMIN D 1000 UNITS PO TABS Oral Take 1,000 Units by mouth daily.    Marland Kitchen CITALOPRAM HYDROBROMIDE 20 MG PO TABS Oral Take 20 mg by mouth daily.    Marland Kitchen  FUROSEMIDE 20 MG PO TABS  TAKE 1 TABLET DAILY. 30 tablet 6  . GLIMEPIRIDE 1 MG PO TABS Oral Take 1 mg by mouth daily before breakfast.    . METFORMIN HCL 500 MG PO TABS Oral Take 500 mg by mouth daily with breakfast.    . OMEPRAZOLE 40 MG PO CPDR Oral Take 40 mg by mouth daily.    Marland Kitchen PRAVASTATIN SODIUM 40 MG PO TABS Oral Take 40 mg by mouth every evening.     . ALENDRONATE SODIUM 70 MG PO TABS Oral Take 70 mg by mouth every 7 (seven) days. Take with a full glass of water on an empty stomach.    Marland Kitchen GABAPENTIN 300 MG PO CAPS Oral Take 300 mg by mouth 3 (three) times daily. 1 cap am, noon and 2 caps at bedtime    . RIVAROXABAN 20 MG PO TABS Oral Take 1 tablet by mouth daily. 30 tablet 12  . SOLIFENACIN SUCCINATE 5 MG PO TABS Oral Take 5 mg by mouth daily.      BP 143/110  Pulse 118  Resp 26  SpO2 90%  Physical Exam  Nursing note and vitals reviewed. Constitutional: She is oriented to person, place, and time. She appears  well-developed and well-nourished.  HENT:  Head: Normocephalic and atraumatic.  Eyes: EOM are normal.  Cardiovascular:  No murmur heard.      Heart rate fast and irregular.   Pulmonary/Chest: Breath sounds normal. She has no wheezes. She has no rales.  Abdominal: Bowel sounds are normal. There is no tenderness.  Musculoskeletal: She exhibits edema (2+ edema in legs bilaterally).  Lymphadenopathy:    She has no cervical adenopathy.  Neurological: She is alert and oriented to person, place, and time. No cranial nerve deficit.  Skin: Skin is warm and dry.  Psychiatric: She has a normal mood and affect. Her behavior is normal.    ED Course  Procedures  DIAGNOSTIC STUDIES: Oxygen Saturation is 90% on room air, low by my interpretation.    COORDINATION OF CARE:  10:08PM Ordered: DG Chest Port 1 View; CBC with Differential; Basic metabolic panel; Pro b natriuretic peptide Results for orders placed during the hospital encounter of 11/03/11  CBC WITH DIFFERENTIAL      Component Value Range   WBC 6.7  4.0 - 10.5 K/uL   RBC 4.17  3.87 - 5.11 MIL/uL   Hemoglobin 12.3  12.0 - 15.0 g/dL   HCT 16.1  09.6 - 04.5 %   MCV 91.6  78.0 - 100.0 fL   MCH 29.5  26.0 - 34.0 pg   MCHC 32.2  30.0 - 36.0 g/dL   RDW 40.9  81.1 - 91.4 %   Platelets 179  150 - 400 K/uL   Neutrophils Relative 65  43 - 77 %   Neutro Abs 4.3  1.7 - 7.7 K/uL   Lymphocytes Relative 23  12 - 46 %   Lymphs Abs 1.6  0.7 - 4.0 K/uL   Monocytes Relative 10  3 - 12 %   Monocytes Absolute 0.6  0.1 - 1.0 K/uL   Eosinophils Relative 2  0 - 5 %   Eosinophils Absolute 0.2  0.0 - 0.7 K/uL   Basophils Relative 0  0 - 1 %   Basophils Absolute 0.0  0.0 - 0.1 K/uL  BASIC METABOLIC PANEL      Component Value Range   Sodium 141  135 - 145 mEq/L   Potassium 3.5  3.5 -  5.1 mEq/L   Chloride 106  96 - 112 mEq/L   CO2 25  19 - 32 mEq/L   Glucose, Bld 131 (*) 70 - 99 mg/dL   BUN 18  6 - 23 mg/dL   Creatinine, Ser 1.61  0.50 - 1.10 mg/dL    Calcium 9.4  8.4 - 09.6 mg/dL   GFR calc non Af Amer 85 (*) >90 mL/min   GFR calc Af Amer >90  >90 mL/min  PRO B NATRIURETIC PEPTIDE      Component Value Range   Pro B Natriuretic peptide (BNP) 6519.0 (*) 0 - 125 pg/mL     Labs Reviewed  BASIC METABOLIC PANEL - Abnormal; Notable for the following:    Glucose, Bld 131 (*)     GFR calc non Af Amer 85 (*)     All other components within normal limits  PRO B NATRIURETIC PEPTIDE - Abnormal; Notable for the following:    Pro B Natriuretic peptide (BNP) 6519.0 (*)     All other components within normal limits  CBC WITH DIFFERENTIAL   Dg Chest Port 1 View  11/03/2011  *RADIOLOGY REPORT*  Clinical Data: Shortness of breath  PORTABLE CHEST - 1 VIEW  Comparison: 08/03/2011  Findings: Prominent cardiomediastinal contours.  Pericardial effusion is suggested.  Bibasilar opacities.  Central vascular congestion and perihilar opacities.  Kerley B lines.  Small effusions not excluded.  Osteopenia.  No acute osseous finding.  IMPRESSION: Prominent cardiomediastinal contours with pulmonary edema pattern.  Question pericardial effusion.  Bilateral opacities; atelectasis versus infiltrate.  Original Report Authenticated By: Waneta Martins, M.D.    Date: 11/03/2011  Rate: 155  Rhythm: atrial fibrillation  QRS Axis: normal  Intervals: normal  ST/T Wave abnormalities: normal  Conduction Disutrbances:none  Narrative Interpretation:   Old EKG Reviewed: none available    1. Atrial fibrillation with rapid ventricular response   2. Shortness of breath     CRITICAL CARE Performed by: Shelda Jakes.   Total critical care time: 30  Critical care time was exclusive of separately billable procedures and treating other patients.  Critical care was necessary to treat or prevent imminent or life-threatening deterioration.  Critical care was time spent personally by me on the following activities: development of treatment plan with patient and/or  surrogate as well as nursing, discussions with consultants, evaluation of patient's response to treatment, examination of patient, obtaining history from patient or surrogate, ordering and performing treatments and interventions, ordering and review of laboratory studies, ordering and review of radiographic studies, pulse oximetry and re-evaluation of patient's condition.   MDM   Patient presented with rapid heart rate all day long associated shortness of breath mild chest discomfort. Upon presentation she was in rapid atrial fibrillation with a heart rate around 155 oxygen saturation on 2 L was in the mid 90s. Patient was treated with 10 mg of diltiazem and IV push with some improvement in heart rate down around 120 to give another 10 mg of diltiazem IV approach heart rate came down to 1 away but never converted gastric tobacco. Range is now from 115 to 125 blood pressure dropped with the medication down to systolic around 95. Chest x-ray shows a little bit of evidence of some interstitial edema some questionable pericardial effusion. Patient is more comfortable off oxygen but feels better with it on. Will contact the hospitalist for a [facility] since her heart rate is still above 100 and blood pressures are marginal. We'll discuss with them starting a  diltiazem drip.  [facility] come see the patient in the emergency department and make final disposition.     I personally performed the services described in this documentation, which was scribed in my presence. The recorded information has been reviewed and considered.          Shelda Jakes, MD 11/03/11 2356  Shelda Jakes, MD 11/04/11 [phone]

## 2011-11-04 ENCOUNTER — Encounter (HOSPITAL_COMMUNITY): Payer: Self-pay | Admitting: Internal Medicine

## 2011-11-04 DIAGNOSIS — I5043 Acute on chronic combined systolic (congestive) and diastolic (congestive) heart failure: Secondary | ICD-10-CM | POA: Diagnosis present

## 2011-11-04 DIAGNOSIS — Z7901 Long term (current) use of anticoagulants: Secondary | ICD-10-CM

## 2011-11-04 DIAGNOSIS — I4891 Unspecified atrial fibrillation: Secondary | ICD-10-CM

## 2011-11-04 DIAGNOSIS — E785 Hyperlipidemia, unspecified: Secondary | ICD-10-CM

## 2011-11-04 DIAGNOSIS — I509 Heart failure, unspecified: Secondary | ICD-10-CM

## 2011-11-04 DIAGNOSIS — E119 Type 2 diabetes mellitus without complications: Secondary | ICD-10-CM

## 2011-11-04 DIAGNOSIS — J811 Chronic pulmonary edema: Secondary | ICD-10-CM | POA: Diagnosis present

## 2011-11-04 DIAGNOSIS — I5021 Acute systolic (congestive) heart failure: Secondary | ICD-10-CM

## 2011-11-04 DIAGNOSIS — I10 Essential (primary) hypertension: Secondary | ICD-10-CM

## 2011-11-04 LAB — CARDIAC PANEL(CRET KIN+CKTOT+MB+TROPI)
CK, MB: 2.4 ng/mL (ref 0.3–4.0)
CK, MB: 1.8 ng/mL (ref 0.3–4.0)
Total CK: 48 U/L (ref 7–177)
Troponin I: <0.3 ng/mL (ref <0.30)
Troponin I: <0.3 ng/mL (ref <0.30)

## 2011-11-04 LAB — GLUCOSE, CAPILLARY
Glucose-Capillary: 151 mg/dL — ABNORMAL HIGH (ref 70–99)
Glucose-Capillary: 127 mg/dL — ABNORMAL HIGH (ref 70–99)

## 2011-11-04 LAB — CBC
HCT: 38.9 % (ref 36.0–46.0)
Hemoglobin: 12.6 g/dL (ref 12.0–15.0)
RBC: 4.24 MIL/uL (ref 3.87–5.11)
RDW: 14.1 % (ref 11.5–15.5)
WBC: 7.3 10*3/uL (ref 4.0–10.5)

## 2011-11-04 LAB — BASIC METABOLIC PANEL
CO2: 27 mEq/L (ref 19–32)
Calcium: 9 mg/dL (ref 8.4–10.5)
Creatinine, Ser: 1 mg/dL (ref 0.50–1.10)
GFR calc Af Amer: 64 mL/min — ABNORMAL LOW (ref >90)
GFR calc non Af Amer: 55 mL/min — ABNORMAL LOW (ref >90)
Sodium: 137 mEq/L (ref 135–145)

## 2011-11-04 LAB — COMPREHENSIVE METABOLIC PANEL
BUN: 16 mg/dL (ref 6–23)
Calcium: 9.4 mg/dL (ref 8.4–10.5)
Creatinine, Ser: 0.71 mg/dL (ref 0.50–1.10)
GFR calc Af Amer: >90 mL/min (ref >90)
Glucose, Bld: 171 mg/dL — ABNORMAL HIGH (ref 70–99)
Total Protein: 7.1 g/dL (ref 6.0–8.3)

## 2011-11-04 LAB — MAGNESIUM: Magnesium: 1.8 mg/dL (ref 1.5–2.5)

## 2011-11-04 LAB — MRSA PCR SCREENING: MRSA by PCR: NEGATIVE

## 2011-11-04 LAB — HEMOGLOBIN A1C: Mean Plasma Glucose: 146 mg/dL — ABNORMAL HIGH (ref <117)

## 2011-11-04 MED ORDER — RIVAROXABAN 10 MG PO TABS
20.0000 mg | ORAL_TABLET | Freq: Every day | ORAL | Status: DC
  Administered 2011-11-04 – 2011-11-08 (×5): 20 mg via ORAL
  Filled 2011-11-04: qty 2
  Filled 2011-11-04: qty 1
  Filled 2011-11-04: qty 2
  Filled 2011-11-04 (×3): qty 1
  Filled 2011-11-04: qty 2

## 2011-11-04 MED ORDER — SODIUM CHLORIDE 0.9 % IJ SOLN
3.0000 mL | Freq: Two times a day (BID) | INTRAMUSCULAR | Status: DC
  Administered 2011-11-04 – 2011-11-06 (×4): 3 mL via INTRAVENOUS
  Filled 2011-11-04: qty 3

## 2011-11-04 MED ORDER — POTASSIUM CHLORIDE CRYS ER 20 MEQ PO TBCR
40.0000 meq | EXTENDED_RELEASE_TABLET | Freq: Two times a day (BID) | ORAL | Status: DC
  Administered 2011-11-04 – 2011-11-08 (×9): 40 meq via ORAL
  Filled 2011-11-04: qty 2
  Filled 2011-11-04: qty 1
  Filled 2011-11-04 (×4): qty 2
  Filled 2011-11-04: qty 1
  Filled 2011-11-04: qty 2
  Filled 2011-11-04: qty 1
  Filled 2011-11-04: qty 2

## 2011-11-04 MED ORDER — DILTIAZEM HCL 100 MG IV SOLR
5.0000 mg/h | INTRAVENOUS | Status: DC
  Filled 2011-11-04: qty 100

## 2011-11-04 MED ORDER — INSULIN ASPART 100 UNIT/ML ~~LOC~~ SOLN
0.0000 [IU] | Freq: Three times a day (TID) | SUBCUTANEOUS | Status: DC
  Administered 2011-11-04: 2 [IU] via SUBCUTANEOUS
  Administered 2011-11-04: 3 [IU] via SUBCUTANEOUS
  Administered 2011-11-05: 2 [IU] via SUBCUTANEOUS
  Administered 2011-11-07: 5 [IU] via SUBCUTANEOUS
  Administered 2011-11-07: 2 [IU] via SUBCUTANEOUS
  Administered 2011-11-08: 3 [IU] via SUBCUTANEOUS
  Administered 2011-11-08: 2 [IU] via SUBCUTANEOUS

## 2011-11-04 MED ORDER — ACETAMINOPHEN 650 MG RE SUPP: 650.0000 mg | Freq: Four times a day (QID) | RECTAL | Status: DC | PRN

## 2011-11-04 MED ORDER — DILTIAZEM HCL 100 MG IV SOLR
20.0000 mg/h | INTRAVENOUS | Status: DC
  Administered 2011-11-04 (×2): 10 mg/h via INTRAVENOUS
  Filled 2011-11-04: qty 100

## 2011-11-04 MED ORDER — SODIUM CHLORIDE 0.9 % IV BOLUS (SEPSIS)
250.0000 mL | Freq: Once | INTRAVENOUS | Status: AC
  Administered 2011-11-04: 250 mL via INTRAVENOUS

## 2011-11-04 MED ORDER — FUROSEMIDE 10 MG/ML IJ SOLN
40.0000 mg | Freq: Two times a day (BID) | INTRAMUSCULAR | Status: DC
  Administered 2011-11-04 – 2011-11-07 (×8): 40 mg via INTRAVENOUS
  Filled 2011-11-04 (×8): qty 4

## 2011-11-04 MED ORDER — ATENOLOL 25 MG PO TABS
50.0000 mg | ORAL_TABLET | Freq: Every day | ORAL | Status: DC
  Administered 2011-11-04 – 2011-11-05 (×2): 50 mg via ORAL
  Filled 2011-11-04 (×2): qty 2

## 2011-11-04 MED ORDER — FUROSEMIDE 10 MG/ML IJ SOLN
40.0000 mg | Freq: Once | INTRAMUSCULAR | Status: AC
  Administered 2011-11-04: 40 mg via INTRAVENOUS
  Filled 2011-11-04: qty 4

## 2011-11-04 MED ORDER — SIMVASTATIN 20 MG PO TABS
20.0000 mg | ORAL_TABLET | Freq: Every day | ORAL | Status: DC
  Administered 2011-11-04: 20 mg via ORAL
  Filled 2011-11-04: qty 1

## 2011-11-04 MED ORDER — DARIFENACIN HYDROBROMIDE ER 7.5 MG PO TB24
7.5000 mg | ORAL_TABLET | Freq: Every day | ORAL | Status: DC
  Administered 2011-11-04 – 2011-11-08 (×5): 7.5 mg via ORAL
  Filled 2011-11-04 (×7): qty 1

## 2011-11-04 MED ORDER — ATENOLOL 25 MG PO TABS: 25.0000 mg | ORAL_TABLET | Freq: Every day | ORAL | Status: DC

## 2011-11-04 MED ORDER — ONDANSETRON HCL 4 MG/2ML IJ SOLN: 4.0000 mg | Freq: Four times a day (QID) | INTRAMUSCULAR | Status: DC | PRN

## 2011-11-04 MED ORDER — CITALOPRAM HYDROBROMIDE 20 MG PO TABS
20.0000 mg | ORAL_TABLET | Freq: Every day | ORAL | Status: DC
  Administered 2011-11-04 – 2011-11-08 (×5): 20 mg via ORAL
  Filled 2011-11-04 (×5): qty 1

## 2011-11-04 MED ORDER — DILTIAZEM LOAD VIA INFUSION
10.0000 mg | Freq: Once | INTRAVENOUS | Status: AC | PRN
  Filled 2011-11-04: qty 10

## 2011-11-04 MED ORDER — ONDANSETRON HCL 4 MG PO TABS: 4.0000 mg | ORAL_TABLET | Freq: Four times a day (QID) | ORAL | Status: DC | PRN

## 2011-11-04 MED ORDER — GABAPENTIN 300 MG PO CAPS
300.0000 mg | ORAL_CAPSULE | Freq: Three times a day (TID) | ORAL | Status: DC
  Administered 2011-11-04 – 2011-11-08 (×13): 300 mg via ORAL
  Filled 2011-11-04 (×13): qty 1

## 2011-11-04 MED ORDER — ACETAMINOPHEN 325 MG PO TABS
650.0000 mg | ORAL_TABLET | Freq: Four times a day (QID) | ORAL | Status: DC | PRN
  Administered 2011-11-04: 650 mg via ORAL
  Filled 2011-11-04: qty 2

## 2011-11-04 MED ORDER — SODIUM CHLORIDE 0.9 % IJ SOLN
3.0000 mL | Freq: Two times a day (BID) | INTRAMUSCULAR | Status: DC
  Administered 2011-11-06 – 2011-11-07 (×3): 3 mL via INTRAVENOUS
  Filled 2011-11-04 (×3): qty 3

## 2011-11-04 NOTE — Progress Notes (Signed)
Patient ID: Christine [name], female   DOB: [birth year], 71 y.o.   MRN: [id number]   CARDIOLOGY CONSULT NOTE  Patient ID: Christine [name] MRN: [id number], DOB/AGE: 17-Oct-1940   Admit date: 11/03/2011 Date of Consult: 11/04/2011   Primary Physician: Ernestine Conrad, MD Primary Cardiologist: Bensley Bing MD  Pt. Profile Mrs Christine [name] is a 71 year old white female well known to our office who comes in with rapid atrial fibrillation and pulmonary edema.  Problem List  Past Medical History  Diagnosis Date  . Seasonal allergies   . Headache     twice weekly  . Palpitations   . Hypertension   . Hyperlipidemia   . Vertigo   . Dyspnea on exertion     pedal edema  . Chronic diarrhea     diverticulosis  . Stress incontinence   . Diabetes mellitus, type 2     Diabetic neuropathy  . Bilateral lower extremity edema   . Osteopenia     DEXA scan 01/2010  . Gout   . Atrial fibrillation 08/2011    First diagnosed in 08/2011; duration of arrhythmia is uncertain    Past Surgical History  Procedure Date  . Cesarean section     x 2  . Appendectomy   . Umbilical hernia repair   . Knee arthroscopy w/ meniscal repair 2007    Bilateral  . Hammer toe surgery     Bilateral hammer toe amputation  . Incisional hernia repair   . Breast biopsy 2002    Right  . Colonoscopy 2007     Allergies  Allergies  Allergen Reactions  . Ace Inhibitors Cough  . Codeine Hives  . Morphine And Related Hives  . Penicillins Hives    HPI  She awoke at 6 AM and was very short of breath. She felt her heart pounding. According to the chart, she was newly diagnosed with A. fib with a rapid rate April of this year. Echocardiogram at that time showed an ejection fraction 40-45%, severe left leg enlargement, moderate dilated LV, moderate right atrial enlargement, aortic sclerosis with no stenosis, mildly thickened mitral valve with moderate mitral regurgitation, mild pulmonary hypertension.  Control with atenolol 100  mg a day and anticoagulation.  When interviewed, she has no idea what she takes at home. Her medicines here with her. Told to take something extra he got in trouble but can't remember what it was. I suspect it was a beta blocker.  Initial evaluation shows pulmonary edema and cardiomegaly on her chest x-ray. EKG showed A. fib with a rapid rate no acute changes. Laboratory data was remarkable for hypokalemia. She has been on Lasix for peripheral edema.  Prior to this, she denies any orthopnea, PND, chest pain or palpitations.  Inpatient Medications     . atenolol  25 mg Oral Daily  . citalopram  20 mg Oral Daily  . darifenacin  7.5 mg Oral Daily  . diltiazem  10 mg Intravenous Once  . diltiazem  10 mg Intravenous Once  . furosemide  40 mg Intravenous Once  . furosemide  40 mg Intravenous Q12H  . gabapentin  300 mg Oral TID  . insulin aspart  0-15 Units Subcutaneous TID WC  . Rivaroxaban  20 mg Oral Daily  . simvastatin  20 mg Oral q1800  . sodium chloride  250 mL Intravenous Once  . sodium chloride  3 mL Intravenous Q12H  . sodium chloride  3 mL Intravenous Q12H  . DISCONTD: diltiazem  20 mg  Intravenous Once  . DISCONTD: HYDROmorphone  1 mg Intravenous Once  . DISCONTD: ondansetron  4 mg Intravenous Once    Family History Family History  Problem Relation Age of Onset  . Adopted: Yes     Social History History   Social History  . Marital Status: Widowed    Spouse Name: N/A    Number of Children: 2  . Years of Education: N/A   Occupational History  . Therapist, sports    Social History Main Topics  . Smoking status: Never Smoker   . Smokeless tobacco: Never Used  . Alcohol Use: No  . Drug Use: No  . Sexually Active: Not on file   Other Topics Concern  . Not on file   Social History Narrative  . No narrative on file     Review of Systems  General:  No chills, fever, night sweats or weight changes.  Cardiovascular:  No chest pain, dyspnea on exertion,   orthopnea, palpitations,  Dermatological: No rash, lesions/masses Respiratory: No cough, dyspnea Urologic: No hematuria, dysuria Abdominal:   No nausea, vomiting, diarrhea, bright red blood per rectum, melena, or hematemesis Neurologic:  No visual changes, wkns, changes in mental status. All other systems reviewed and are otherwise negative except as noted above.  Physical Exam  Blood pressure 125/79, pulse 110, temperature 97.6 F (36.4 C), temperature source Oral, resp. rate 22, height 5\' 4"  (1.626 m), weight 195 lb 5.2 oz (88.6 kg), SpO2 100.00%.  General: Pleasant, NAD, dyspneic Psych: Normal affect. Neuro: Alert and oriented X 3. Moves all extremities spontaneously. HEENT: Normal  Neck: Supple without bruits or JVD. Lungs:  Resp regular and unlabored, CTA. Heart: IRRR Abdomen: Soft, non-tender, non-distended, BS + x 4.  Extremities: No clubbing, cyanosis, 1+ pitting edema of lower extremities.Marland Kitchen DP/PT/Radials 2+ and equal bilaterally.  Labs   [facility] 11/04/11 0204  CKTOTAL 76  CKMB 2.8  TROPONINI <0.30   Lab Results  Component Value Date   WBC 7.3 11/04/2011   HGB 12.6 11/04/2011   HCT 38.9 11/04/2011   MCV 91.7 11/04/2011   PLT 185 11/04/2011    Lab 11/04/11 0204  NA 139  K 3.4*  CL 102  CO2 28  BUN 16  CREATININE 0.71  CALCIUM 9.4  PROT 7.1  BILITOT 0.7  ALKPHOS 67  ALT 79*  AST 75*  GLUCOSE 171*   Lab Results  Component Value Date   CHOL 153 09/21/2011   HDL 47 09/21/2011   LDLCALC 76 09/21/2011   TRIG 148 09/21/2011   No results found for this basename: DDIMER    Radiology/Studies  Dg Chest Port 1 View  11/03/2011  *RADIOLOGY REPORT*  Clinical Data: Shortness of breath  PORTABLE CHEST - 1 VIEW  Comparison: 08/03/2011  Findings: Prominent cardiomediastinal contours.  Pericardial effusion is suggested.  Bibasilar opacities.  Central vascular congestion and perihilar opacities.  Kerley B lines.  Small effusions not excluded.  Osteopenia.  No acute osseous  finding.  IMPRESSION: Prominent cardiomediastinal contours with pulmonary edema pattern.  Question pericardial effusion.  Bilateral opacities; atelectasis versus infiltrate.  Original Report Authenticated By: Waneta Martins, M.D.    ECG Atrial fibrillation with rapid ventricular rate  ASSESSMENT AND PLAN #1 chronic A. fib controlled with atenolol 100 mg a day and anticoagulation. He presents with rapid atrial fib with pulmonary edema which was acute event. Is not clear if she knows what to it this occurs in the future nor does she seem to know  her medicines . Long discussion with her and the family.  I've increased her diltiazem to 20 mg per hour continuously, potassium supplement to achieve a level of greater than 4, and continue diuresis. She's been out 2200 since 3 AM. Cancel echo since just done in April of this year.  I have asked her family to bring her medications to the hospital. We have carefully reviewed these with her family and the patient. She is to weigh herself every day take extra Lasix if her weight goes up more than 3 pounds. It should be accompanied by extra potassium supplementation. She also should be instructed on taking extra beta blocker if she feels her heart rate is increased.   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

## 2011-11-04 NOTE — Progress Notes (Signed)
Patient admitted earlier today by Dr. Rito Ehrlich  Patient seen and examined, database reviewed  She was admitted for atrial fibrillation with rapid ventricular response and also pulmonary edema.  She has chronic atrial fibrillation and mild systolic dysfunction on her previous echo.  Cardiac markers thus far have been negative.   She was started on furosemide and has diuresed a couple of liters.  She is starting to feel better.  She was seen by cardiology with adjustments to her medications.  Hopefully we can keep her rate under control with beta blockers, as calcium channel blockers would not be ideal with her systolic dysfunction. Digoxin would be another option. She is continued on diltiazem infusion at this point, and this will be weaned off as tolerated. She is anticoagulated with xarelto. She will remain in the SDU for now.

## 2011-11-04 NOTE — Progress Notes (Signed)
Notified MD of 19 beat run of v-tach.

## 2011-11-04 NOTE — Evaluation (Signed)
Physical Therapy Evaluation Patient Details Name: Christine [name] MRN: [id number] DOB: 1941/01/14 Today's Date: 11/04/2011 Time: 4098-1191 PT Time Calculation (min): 30 min  PT Assessment / Plan / Recommendation Clinical Impression  Pt functional limitations are cardiac/pulmonary in nature.  Pt exhibits functional ROM/Strength of LE but decreased functional mobility due to cardiac pulmonary issues.  Pt will benefit from skilled therapy for energy conservation techniques and to improve functional mobility.    PT Assessment  Patient needs continued PT services    Follow Up Recommendations  No PT follow up    Barriers to Discharge None      Equipment Recommendations  None recommended by PT    Recommendations for Other Services   OT-energy conservation techniques  Frequency Min 3X/week    Precautions / Restrictions Precautions Precautions: None Restrictions Weight Bearing Restrictions: No         Mobility  Bed Mobility Bed Mobility: Supine to Sit;Sitting - Scoot to Edge of Bed Supine to Sit: 7: Independent Sitting - Scoot to [place] of Bed: 7: Independent Transfers Transfers: Sit to Stand Sit to Stand: 6: Modified independent (Device/Increase time) Ambulation/Gait Ambulation/Gait Assistance: 5: Supervision Ambulation Distance (Feet): 20 Feet Assistive device: Straight cane Ambulation/Gait Assistance Details: Pt O2 states continuted to go down with ambulation.  At 10 ft O2 was at 91 by the time pt turned around and sat down in a chair O2 87with 2L O2.  Sat quickly recovered to 100% with verbal cuing to breath in thru her nose and not to mouth breathe.    Exercises  none given today   PT Diagnosis: Other (comment) (decreased functional mobility)  PT Problem List: Cardiopulmonary status limiting activity;Decreased activity tolerance PT Treatment Interventions: Functional mobility training;Patient/family education (educated today on why you need to breathe via nose vs. mouth)     PT Goals Acute Rehab PT Goals PT Goal Formulation: With patient Time For Goal Achievement: 11/08/11 Potential to Achieve Goals: Good Pt will Transfer Bed to Chair/Chair to Bed: Independently PT Transfer Goal: Bed to Chair/Chair to Bed - Progress: Goal set today Pt will Ambulate: 51 - 150 feet;with modified independence PT Goal: Ambulate - Progress: Goal set today Pt will Go Up / Down Stairs: 1-2 stairs;with rail(s);with modified independence PT Goal: Up/Down Stairs - Progress: Goal set today  Visit Information  Last PT Received On: 11/04/11    Subjective Data  Subjective: Pt states she has cane, quad cane and walker at home.  Normally uses cane.  Pt states that lately she has been dizzy, therefore she has started using the quad cane and walker. 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 Communication Communication: No difficulties Dominant Hand: Right    Cognition  Overall Cognitive Status: Appears within functional limits for tasks assessed/performed Arousal/Alertness: Awake/alert Orientation Level: Appears intact for tasks assessed Behavior During Session: [facility] for tasks performed    Extremity/Trunk Assessment Right Lower Extremity Assessment RLE ROM/Strength/Tone: [facility] for tasks assessed Left Lower Extremity Assessment LLE ROM/Strength/Tone: [facility] for tasks assessed   Balance    End of Session PT - End of Session Equipment Utilized During  Treatment: Gait belt Activity Tolerance: Patient tolerated  treatment well Patient left: in chair;with call bell/phone within reach;with family/visitor present  GP     RUSSELL,CINDY 11/04/2011, 9:48 AM

## 2011-11-04 NOTE — H&P (Addendum)
Christine [name] is an 71 y.o. female.    PCP: Ernestine Conrad, MD  Her cardiologist is Dr. Dietrich Pates  Chief Complaint: Fluttering in the chest, and shortness of breath  HPI: This is a 71 year old, Caucasian female, with a past with history of atrial fibrillation that was diagnosed earlier this year, history of hypertension, and diabetes. She was in her usual state of health till this morning when she woke up and was sitting and felt that her heart was beating fast. This was followed by onset of shortness of breath. She also had some chest tightness, which was 3/10 in intensity. She had a dry cough, which has been ongoing for 2-3 months. Denies any history of smoking. No history of fever or chills. She has been taking her medications on a regular basis. She has noted that the leg swelling has worsened over the last few weeks. However, she tells me, that she's actually lost weight. She went to see her cardiologist back in May, and some medication adjustments were made. She does not appear to know all of her home medications. Compliance is a question at this time. Denies any dizziness or lightheadedness. No history of syncopal episode. Her chest discomfort and shortness of breath, is much better at this time   Home Medications: Prior to Admission medications   Medication Sig Start Date End Date Taking? Authorizing Provider  atenolol (TENORMIN) 100 MG tablet Take 1 tablet (100 mg total) by mouth daily. 09/21/11  Yes Kathlen Brunswick, MD  cetirizine (ZYRTEC) 10 MG tablet Take 10 mg by mouth daily.   Yes Historical Provider, MD  cholecalciferol (VITAMIN D) 1000 UNITS tablet Take 1,000 Units by mouth daily.   Yes Historical Provider, MD  citalopram (CELEXA) 20 MG tablet Take 20 mg by mouth daily.   Yes Historical Provider, MD  furosemide (LASIX) 20 MG tablet TAKE 1 TABLET DAILY. 10/25/11  Yes Kathlen Brunswick, MD  glimepiride (AMARYL) 1 MG tablet Take 1 mg by mouth daily before breakfast.   Yes Historical  Provider, MD  metFORMIN (GLUCOPHAGE) 500 MG tablet Take 500 mg by mouth daily with breakfast.   Yes Historical Provider, MD  omeprazole (PRILOSEC) 40 MG capsule Take 40 mg by mouth daily.   Yes Historical Provider, MD  pravastatin (PRAVACHOL) 40 MG tablet Take 40 mg by mouth every evening.    Yes Historical Provider, MD  alendronate (FOSAMAX) 70 MG tablet Take 70 mg by mouth every 7 (seven) days. Take with a full glass of water on an empty stomach.    Historical Provider, MD  gabapentin (NEURONTIN) 300 MG capsule Take 300 mg by mouth 3 (three) times daily. 1 cap am, noon and 2 caps at bedtime    Historical Provider, MD  Rivaroxaban 20 MG TABS Take 1 tablet by mouth daily. 09/02/11   Kathlen Brunswick, MD  solifenacin (VESICARE) 5 MG tablet Take 5 mg by mouth daily.    Historical Provider, MD    Allergies:  Allergies  Allergen Reactions  . Ace Inhibitors Cough  . Codeine Hives  . Morphine And Related Hives  . Penicillins Hives    Past Medical History: Past Medical History  Diagnosis Date  . Seasonal allergies   . Headache     twice weekly  . Palpitations   . Hypertension   . Hyperlipidemia   . Vertigo   . Dyspnea on exertion     pedal edema  . Chronic diarrhea     diverticulosis  .  Stress incontinence   . Diabetes mellitus, type 2     Diabetic neuropathy  . Bilateral lower extremity edema   . Osteopenia     DEXA scan 01/2010  . Gout   . Atrial fibrillation 08/2011    First diagnosed in 08/2011; duration of arrhythmia is uncertain    Past Surgical History  Procedure Date  . Cesarean section     x 2  . Appendectomy   . Umbilical hernia repair   . Knee arthroscopy w/ meniscal repair 2007    Bilateral  . Hammer toe surgery     Bilateral hammer toe amputation  . Incisional hernia repair   . Breast biopsy 2002    Right  . Colonoscopy 2007    Social History: Patient lives alone in [place]. Her daughter lives close by. Denies any smoking or alcohol use. 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  Family History:  Family History  Problem Relation Age of Onset  . Adopted: Yes   She doesn't know of any medical problems in her family.  Review of Systems - History obtained from the patient General ROS: positive for  - fatigue Psychological ROS: negative Ophthalmic ROS: negative ENT ROS: negative Allergy and Immunology ROS: negative Hematological and Lymphatic ROS: negative Endocrine ROS: negative Respiratory ROS: as in hpi Cardiovascular ROS: as in hpi Gastrointestinal ROS: no abdominal pain, change in bowel habits, or black or bloody stools Genito-Urinary ROS: no dysuria, trouble voiding, or hematuria Musculoskeletal ROS: negative Neurological ROS: no TIA or stroke symptoms Dermatological ROS: negative  Physical Examination Blood pressure 96/79, pulse 75, resp. rate 30, SpO2 96.00%.  General appearance: alert, cooperative, appears stated age and no distress Head: Normocephalic, without obvious abnormality, atraumatic Eyes: conjunctivae/corneas clear. PERRL, EOM's intact.  Throat: lips, mucosa, and tongue normal; teeth and gums normal Neck: no adenopathy, no carotid bruit, no JVD, supple, symmetrical, trachea midline and thyroid not enlarged, symmetric, no tenderness/mass/nodules Back: symmetric, no curvature. ROM normal. No CVA tenderness. Resp: Find crackles are appreciated in both lung fields, about halfway up Cardio: S1, S2 is irregularly irregular. JVD is present. Lower extremity edema is present 2+. No murmurs are heard. No rubs, or bruits. GI: soft, non-tender; bowel sounds normal; no masses,  no organomegaly Extremities: edema 2+ Pulses: 2+ and symmetric Skin: Skin color, texture, turgor normal. No rashes or lesions Lymph nodes: Cervical, supraclavicular, and axillary nodes normal. Neurologic: She is alert and oriented. No focal neurological deficit present. At times she tends to ask the same question twice.  She may have mild cognitive deficit.  Laboratory Data: Results for orders placed during the hospital encounter of 11/03/11 (from the past 48 hour(s))  CBC WITH DIFFERENTIAL     Status: Normal   Collection Time   11/03/11 10:10 PM      Component Value Range Comment   WBC 6.7  4.0 - 10.5 K/uL    RBC 4.17  3.87 - 5.11 MIL/uL    Hemoglobin 12.3  12.0 - 15.0 g/dL    HCT 65.7  84.6 - 96.2 %    MCV 91.6  78.0 - 100.0 fL    MCH 29.5  26.0 - 34.0 pg    MCHC 32.2  30.0 - 36.0 g/dL    RDW 95.2  84.1 - 32.4 %    Platelets 179  150 - 400 K/uL    Neutrophils Relative 65  43 - 77 %    Neutro Abs 4.3  1.7 -  7.7 K/uL    Lymphocytes Relative 23  12 - 46 %    Lymphs Abs 1.6  0.7 - 4.0 K/uL    Monocytes Relative 10  3 - 12 %    Monocytes Absolute 0.6  0.1 - 1.0 K/uL    Eosinophils Relative 2  0 - 5 %    Eosinophils Absolute 0.2  0.0 - 0.7 K/uL    Basophils Relative 0  0 - 1 %    Basophils Absolute 0.0  0.0 - 0.1 K/uL   BASIC METABOLIC PANEL     Status: Abnormal   Collection Time   11/03/11 10:10 PM      Component Value Range Comment   Sodium 141  135 - 145 mEq/L    Potassium 3.5  3.5 - 5.1 mEq/L    Chloride 106  96 - 112 mEq/L    CO2 25  19 - 32 mEq/L    Glucose, Bld 131 (*) 70 - 99 mg/dL    BUN 18  6 - 23 mg/dL    Creatinine, Ser 1.61  0.50 - 1.10 mg/dL    Calcium 9.4  8.4 - 09.6 mg/dL    GFR calc non Af Amer 85 (*) >90 mL/min    GFR calc Af Amer >90  >90 mL/min   PRO B NATRIURETIC PEPTIDE     Status: Abnormal   Collection Time   11/03/11 10:10 PM      Component Value Range Comment   Pro B Natriuretic peptide (BNP) 6519.0 (*) 0 - 125 pg/mL     Radiology Reports: Dg Chest Port 1 View  11/03/2011  *RADIOLOGY REPORT*  Clinical Data: Shortness of breath  PORTABLE CHEST - 1 VIEW  Comparison: 08/03/2011  Findings: Prominent cardiomediastinal contours.  Pericardial effusion is suggested.  Bibasilar opacities.  Central vascular congestion and perihilar opacities.  Kerley B lines.  Small effusions not  excluded.  Osteopenia.  No acute osseous finding.  IMPRESSION: Prominent cardiomediastinal contours with pulmonary edema pattern.  Question pericardial effusion.  Bilateral opacities; atelectasis versus infiltrate.  Original Report Authenticated By: Waneta Martins, M.D.    Electrocardiogram: A. fib at 155 beats per minute. Normal axis. No concerning ST changes are noted. No concerning T-wave changes are noted. Occasional PVC is seen.  Assessment/Plan  Principal Problem:  *Atrial fibrillation with RVR Active Problems:  Hypertension  Diabetes mellitus, type 2  Chronic anticoagulation  Pulmonary edema  Acute systolic heart failure   #1 Atrial fibrillation with a rapid ventricular response: She's been given 2 bolus doses of Cardizem. Heart rate still is around 120s. We will initiate a Cardizem infusion. And we will monitor her in step down unit.  #2 pulmonary edema likely due to acute systolic congestive heart failure: Give her intravenous Lasix. Echocardiogram from April showed EF of 40-45% with hypokinesis of multiple walls. She tells me that her leg edema has worsened over the last many weeks. It is unclear if her heart failure has worsened or not. I think it might be worthwhile getting another echocardiogram on her. This will also help [place] evaluate the pericardial effusion that was questioned on the chest x-ray. We will also consult cardiology to take a look at her. She has not had an ischemic workup. She's not an ACE inhibitor because she developed a cough to this. Alternative agents can be considered once we have the report of the echocardiogram. A low-dose beta blocker can also be considered. She is on atenolol 100 mg however, because of  borderline blood pressures such a high dose may not be desirable.  #3 on chronic anticoagulation with Rivaroxaban which will be continued.  #4 history of, diabetes, type II: Will place her on sliding scale insulin. We will hold her oral hypoglycemic  agents for now. HbA1c will be checked.  #5 history of hypertension: Blood pressure did drop after she received a bolus doses of Cardizem. She'll be monitored closely.  She is a full code.  Further management decisions will depend on results of further testing and patient's response to treatment.  [facility]  [facility] Pager [phone]  11/04/2011, 12:39 AM

## 2011-11-04 NOTE — Plan of Care (Signed)
Problem: Consults Goal: Atrial Arhythmia Patient Education (See Patient Education module for education specifics.) Outcome: Progressing Pt understands the basics of A-fib as evidenced by teach back by pt. Goal: Skin Care Protocol Initiated - if indicated If consults are not indicated, leave blank or document N/A Outcome: Completed/Met Date Met:  11/04/11 Pt has bruise on left FA from from fall last week Goal: Tobacco Cessation referral if indicated Outcome: Completed/Met Date Met:  11/04/11 Pt has never smoked Goal: Diabetes Guidelines if Diabetic/Glucose > 140 If diabetic or lab glucose is > 140 mg/dl - Initiate Diabetes/Hyperglycemia Guidelines & Document Interventions  Outcome: Progressing CBG = 151 @0330   Problem: Phase I Progression Outcomes Goal: Ventricular heart rate < 120/min Outcome: Progressing Hr 105 on 10mg  cardizem drip Goal: Heart rate or rhythm control medication Outcome: Progressing Pt remains in A-fib Hr 107 on cardizem 10mg  Goal: Hemodynamically stable Outcome: Progressing VS stable BP=115/69 HR103 RR=20 SAt 99%

## 2011-11-04 NOTE — Progress Notes (Signed)
MD made aware of decreased BP. Cardizem turned off.

## 2011-11-04 NOTE — Evaluation (Signed)
Occupational Therapy Evaluation Patient Details Name: Christine [name] MRN: [id number] DOB: 09-Jul-1940 Today's Date: 11/04/2011 Time: 1340-1400 OT Time Calculation (min): 20 min  OT Assessment / Plan / Recommendation Clinical Impression  Patient is a 71 y/o female s/p Atrial Fibrillation presenting to acute OT with all education completed. Patient provided with handout regarding energy conservation technique during ADL. Reviewed handout and discussed various energy conservation techniques that patient could put into use during her daily tasks. Recommended patient use shower seat in walk-in shower and hand held shower head to help conserve energy. Patient verbalized understanding and was very open and grateful for information provided. Daughter and two grandsons were present as well during eval. No further OT services needed at this time; will sign off.    OT Assessment  Patient does not need any further OT services    Follow Up Recommendations  No OT follow up       Equipment Recommendations  None recommended by OT          Precautions / Restrictions Precautions Precautions: None   Pertinent Vitals/Pain No reports of pain.    ADL  Grooming: Modified independent;Performed Where Assessed - Grooming: Unsupported sitting Upper Body Bathing: Simulated;Modified independent Where Assessed - Upper Body Bathing: Unsupported sitting Lower Body Bathing: Simulated;Supervision/safety Where Assessed - Lower Body Bathing: Unsupported sit to stand Upper Body Dressing: Simulated;Supervision/safety Where Assessed - Upper Body Dressing: Unsupported sitting Lower Body Dressing: Simulated;Supervision/safety Where Assessed - Lower Body Dressing: Unsupported sit to stand Toilet Transfer: Performed;Supervision/safety Acupuncturist: Regular height toilet;Grab bars Transfers/Ambulation Related to ADLs: Patient transfers at Supervision level.      Visit Information  Last OT Received On:  11/04/11 Assistance Needed: +1    Subjective Data  Subjective: "I always try to rush and do things." Patient Stated Goal: To go home.   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

## 2011-11-04 NOTE — ED Notes (Signed)
Patient states she is feeling a lot better now, denies pain and states that her breathing is a lot easier.  She is agreeable with admission to the hospital.

## 2011-11-05 DIAGNOSIS — E785 Hyperlipidemia, unspecified: Secondary | ICD-10-CM

## 2011-11-05 DIAGNOSIS — I5043 Acute on chronic combined systolic (congestive) and diastolic (congestive) heart failure: Secondary | ICD-10-CM

## 2011-11-05 DIAGNOSIS — I509 Heart failure, unspecified: Secondary | ICD-10-CM

## 2011-11-05 LAB — GLUCOSE, CAPILLARY
Glucose-Capillary: 116 mg/dL — ABNORMAL HIGH (ref 70–99)
Glucose-Capillary: 131 mg/dL — ABNORMAL HIGH (ref 70–99)
Glucose-Capillary: 88 mg/dL (ref 70–99)

## 2011-11-05 LAB — BASIC METABOLIC PANEL
CO2: 28 mEq/L (ref 19–32)
Calcium: 9.2 mg/dL (ref 8.4–10.5)
Chloride: 103 mEq/L (ref 96–112)
Creatinine, Ser: 0.9 mg/dL (ref 0.50–1.10)
Glucose, Bld: 115 mg/dL — ABNORMAL HIGH (ref 70–99)
Sodium: 139 mEq/L (ref 135–145)

## 2011-11-05 LAB — CBC
HCT: 38.3 % (ref 36.0–46.0)
MCH: 29.3 pg (ref 26.0–34.0)
MCV: 93.6 fL (ref 78.0–100.0)
Platelets: 191 10*3/uL (ref 150–400)
RBC: 4.09 MIL/uL (ref 3.87–5.11)

## 2011-11-05 MED ORDER — DIGOXIN 0.25 MG/ML IJ SOLN
INTRAMUSCULAR | Status: AC
  Filled 2011-11-05: qty 2

## 2011-11-05 MED ORDER — SIMVASTATIN 20 MG PO TABS
20.0000 mg | ORAL_TABLET | Freq: Every day | ORAL | Status: DC
  Administered 2011-11-05 – 2011-11-07 (×3): 20 mg via ORAL
  Filled 2011-11-05 (×3): qty 1

## 2011-11-05 MED ORDER — DIGOXIN 0.25 MG/ML IJ SOLN
0.5000 mg | Freq: Once | INTRAMUSCULAR | Status: AC
  Administered 2011-11-05: 0.5 mg via INTRAVENOUS
  Filled 2011-11-05: qty 2

## 2011-11-05 MED ORDER — DIGOXIN 250 MCG PO TABS
0.2500 mg | ORAL_TABLET | Freq: Every day | ORAL | Status: DC
  Administered 2011-11-06 – 2011-11-08 (×3): 0.25 mg via ORAL
  Filled 2011-11-05 (×3): qty 1

## 2011-11-05 MED ORDER — DIGOXIN 0.25 MG/ML IJ SOLN
0.2500 mg | Freq: Once | INTRAMUSCULAR | Status: AC
  Administered 2011-11-05: 0.25 mg via INTRAVENOUS

## 2011-11-05 NOTE — Progress Notes (Signed)
Subjective: Feels that her breathing is improving. No chest pain or palpitations. No other complaints.  Objective: Vital signs in last 24 hours: Temp:  [96.8 F (36 C)-98 F (36.7 C)] 97.6 F (36.4 C) (07/06 0800) Pulse Rate:  [36-109] 80  (07/06 0800) Resp:  [18-27] 20  (07/06 0800) BP: (48-113)/(16-73) 105/66 mmHg (07/06 0800) SpO2:  [96 %-100 %] 100 % (07/06 0828) Weight:  [87.7 kg (193 lb 5.5 oz)] 87.7 kg (193 lb 5.5 oz) (07/06 0500) Weight change: -0.9 kg (-1 lb 15.8 oz) Last BM Date: 11/04/11  Intake/Output from previous day: 07/05 0701 - 07/06 0700 In: 924 [P.O.:920; IV Piggyback:4] Out: 2175 [Urine:2175] Total I/O In: 360 [P.O.:360] Out: 500 [Urine:500]   Physical Exam: General: Alert, awake, oriented x3, in no acute distress. HEENT: No bruits, no goiter. Heart: Irregular Lungs: Crackles at bases Abdomen: Soft, nontender, nondistended, positive bowel sounds. Extremities: 1-2+ edema bilaterally Neuro: Grossly intact, nonfocal.    Lab Results: Basic Metabolic Panel:  Basename 11/05/11 0518 11/04/11 2255  NA 139 137  K 4.2 4.0  CL 103 101  CO2 28 27  GLUCOSE 115* 123*  BUN 22 24*  CREATININE 0.90 1.00  CALCIUM 9.2 9.0  MG 1.8 1.8  PHOS -- --   Liver Function Tests:  [facility] 11/04/11 0204  AST 75*  ALT 79*  ALKPHOS 67  BILITOT 0.7  PROT 7.1  ALBUMIN 3.4*   No results found for this basename: LIPASE:2,AMYLASE:2 in the last 72 hours No results found for this basename: AMMONIA:2 in the last 72 hours CBC:  Basename 11/05/11 0518 11/04/11 0204 11/03/11 2210  WBC 5.6 7.3 --  NEUTROABS -- -- 4.3  HGB 12.0 12.6 --  HCT 38.3 38.9 --  MCV 93.6 91.7 --  PLT 191 185 --   Cardiac Enzymes:  Basename 11/04/11 1735 11/04/11 1003 11/04/11 0204  CKTOTAL 48 62 76  CKMB 1.8 2.4 2.8  CKMBINDEX -- -- --  TROPONINI <0.30 <0.30 <0.30   BNP:  Basename 11/03/11 2210  PROBNP 6519.0*   D-Dimer: No results found for this basename: DDIMER:2 in the last  72 hours CBG:  Basename 11/05/11 0730 11/04/11 2113 11/04/11 1646 11/04/11 1105 11/04/11 0726 11/04/11 0335  GLUCAP 116* 124* 170* 110* 127* 151*   Hemoglobin A1C:  Basename 11/04/11 0204  HGBA1C 6.7*   Fasting Lipid Panel: No results found for this basename: CHOL,HDL,LDLCALC,TRIG,CHOLHDL,LDLDIRECT in the last 72 hours Thyroid Function Tests: No results found for this basename: TSH,T4TOTAL,FREET4,T3FREE,THYROIDAB in the last 72 hours Anemia Panel: No results found for this basename: VITAMINB12,FOLATE,FERRITIN,TIBC,IRON,RETICCTPCT in the last 72 hours Coagulation: No results found for this basename: LABPROT:2,INR:2 in the last 72 hours Urine Drug Screen: Drugs of Abuse  No results found for this basename: labopia, cocainscrnur, labbenz, amphetmu, thcu, labbarb    Alcohol Level: No results found for this basename: ETH:2 in the last 72 hours Urinalysis: No results found for this basename: COLORURINE:2,APPERANCEUR:2,LABSPEC:2,PHURINE:2,GLUCOSEU:2,HGBUR:2,BILIRUBINUR:2,KETONESUR:2,PROTEINUR:2,UROBILINOGEN:2,NITRITE:2,LEUKOCYTESUR:2 in the last 72 hours  Recent Results (from the past 240 hour(s))  MRSA PCR SCREENING     Status: Normal   Collection Time   11/04/11  1:19 AM      Component Value Range Status Comment   MRSA by PCR NEGATIVE  NEGATIVE Final     Studies/Results: Dg Chest Port 1 View  11/03/2011  *RADIOLOGY REPORT*  Clinical Data: Shortness of breath  PORTABLE CHEST - 1 VIEW  Comparison: 08/03/2011  Findings: Prominent cardiomediastinal contours.  Pericardial effusion is suggested.  Bibasilar opacities.  Central vascular  congestion and perihilar opacities.  Kerley B lines.  Small effusions not excluded.  Osteopenia.  No acute osseous finding.  IMPRESSION: Prominent cardiomediastinal contours with pulmonary edema pattern.  Question pericardial effusion.  Bilateral opacities; atelectasis versus infiltrate.  Original Report Authenticated By: Waneta Martins, M.D.     Medications: Scheduled Meds:   . atenolol  50 mg Oral Daily  . citalopram  20 mg Oral Daily  . darifenacin  7.5 mg Oral Daily  . furosemide  40 mg Intravenous Q12H  . gabapentin  300 mg Oral TID  . insulin aspart  0-15 Units Subcutaneous TID WC  . potassium chloride  40 mEq Oral BID  . Rivaroxaban  20 mg Oral Daily  . simvastatin  20 mg Oral q1800  . sodium chloride  250 mL Intravenous Once  . sodium chloride  3 mL Intravenous Q12H  . sodium chloride  3 mL Intravenous Q12H   Continuous Infusions:   . diltiazem (CARDIZEM) infusion Stopped (11/04/11 1100)   PRN Meds:.acetaminophen, acetaminophen, diltiazem, ondansetron (ZOFRAN) IV, ondansetron  Assessment/Plan:  Principal Problem:  *Atrial fibrillation with RVR Active Problems:  Hypertension  Diabetes mellitus, type 2  Chronic anticoagulation  Pulmonary edema  Acute on chronic combined systolic and diastolic CHF (congestive heart failure)  Plan:  #1 rapid atrial fibrillation. Patient's heart rate remains elevated in the 120s to 130s. She is currently on atenolol which she takes at home. Review of her home medications also indicates that she is on long-acting Cardizem. With her systolic blood pressure in the 90s, I do not think that starting Cardizem is an option at this point. Patient will be loaded with digoxin. She is anticoagulated with xarelto  #2. Acute on chronic combined congestive heart failure. Patient's weight is down approximately 2 pounds. She is diuresing adequately with IV Lasix. Continue current treatments.  #3. Diabetes. Hemoglobin A1c is 6.7 indicating good control. She'll be continued on sliding scale insulin for now and will be discharged on her outpatient regimen. Blood sugars in the hospital have been under good control.  #4. Disposition. With patient's tachycardia, she'll be continued to be monitored in the step down unit   LOS: 2 days   MEMON,JEHANZEB [facility] Pager:  [phone] 11/05/2011, 10:07 AM

## 2011-11-06 LAB — GLUCOSE, CAPILLARY
Glucose-Capillary: 116 mg/dL — ABNORMAL HIGH (ref 70–99)
Glucose-Capillary: 209 mg/dL — ABNORMAL HIGH (ref 70–99)

## 2011-11-06 LAB — BASIC METABOLIC PANEL
Chloride: 97 mEq/L (ref 96–112)
GFR calc Af Amer: >90 mL/min (ref >90)
GFR calc non Af Amer: 82 mL/min — ABNORMAL LOW (ref >90)
Potassium: 4.7 mEq/L (ref 3.5–5.1)
Sodium: 136 mEq/L (ref 135–145)

## 2011-11-06 MED ORDER — ATENOLOL 25 MG PO TABS
100.0000 mg | ORAL_TABLET | Freq: Every day | ORAL | Status: DC
  Administered 2011-11-06: 100 mg via ORAL
  Filled 2011-11-06: qty 1
  Filled 2011-11-06: qty 3

## 2011-11-06 NOTE — Progress Notes (Signed)
Subjective: Patient feels her breathing is improving. She does not have any chest pain or palpitations.  Objective: Vital signs in last 24 hours: Temp:  [97 F (36.1 C)-98.3 F (36.8 C)] 98 F (36.7 C) (07/07 0430) Pulse Rate:  [60-119] 79  (07/07 0400) Resp:  [16-27] 17  (07/07 0700) BP: (85-108)/(54-87) 106/64 mmHg (07/06 2000) SpO2:  [77 %-100 %] 99 % (07/07 0400) Weight:  [82.9 kg (182 lb 12.2 oz)] 82.9 kg (182 lb 12.2 oz) (07/07 0500) Weight change: -4.8 kg (-10 lb 9.3 oz) Last BM Date: 11/04/11  Intake/Output from previous day: 07/06 0701 - 07/07 0700 In: 1808 [P.O.:1800; IV Piggyback:8] Out: 3850 [Urine:3850] Total I/O In: 244 [P.O.:240; IV Piggyback:4] Out: -    Physical Exam: General: Alert, awake, oriented x3, in no acute distress. HEENT: No bruits, no goiter. Heart: Irregular, tachycardic Lungs: Crackles at bases Abdomen: Soft, nontender, nondistended, positive bowel sounds. Extremities: 1+ edema bilaterally Neuro: Grossly intact, nonfocal.    Lab Results: Basic Metabolic Panel:  Basename 11/06/11 0450 11/05/11 0518 11/04/11 2255  NA 136 139 --  K 4.7 4.2 --  CL 97 103 --  CO2 34* 28 --  GLUCOSE 123* 115* --  BUN 19 22 --  CREATININE 0.78 0.90 --  CALCIUM 10.2 9.2 --  MG -- 1.8 1.8  PHOS -- -- --   Liver Function Tests:  [facility] 11/04/11 0204  AST 75*  ALT 79*  ALKPHOS 67  BILITOT 0.7  PROT 7.1  ALBUMIN 3.4*   No results found for this basename: LIPASE:2,AMYLASE:2 in the last 72 hours No results found for this basename: AMMONIA:2 in the last 72 hours CBC:  Basename 11/05/11 0518 11/04/11 0204 11/03/11 2210  WBC 5.6 7.3 --  NEUTROABS -- -- 4.3  HGB 12.0 12.6 --  HCT 38.3 38.9 --  MCV 93.6 91.7 --  PLT 191 185 --   Cardiac Enzymes:  Basename 11/04/11 1735 11/04/11 1003 11/04/11 0204  CKTOTAL 48 62 76  CKMB 1.8 2.4 2.8  CKMBINDEX -- -- --  TROPONINI <0.30 <0.30 <0.30   BNP:  Basename 11/03/11 2210  PROBNP 6519.0*    D-Dimer: No results found for this basename: DDIMER:2 in the last 72 hours CBG:  Basename 11/05/11 2146 11/05/11 1642 11/05/11 1134 11/05/11 0730 11/04/11 2113 11/04/11 1646  GLUCAP 143* 88 131* 116* 124* 170*   Hemoglobin A1C:  Basename 11/04/11 0204  HGBA1C 6.7*   Fasting Lipid Panel: No results found for this basename: CHOL,HDL,LDLCALC,TRIG,CHOLHDL,LDLDIRECT in the last 72 hours Thyroid Function Tests: No results found for this basename: TSH,T4TOTAL,FREET4,T3FREE,THYROIDAB in the last 72 hours Anemia Panel: No results found for this basename: VITAMINB12,FOLATE,FERRITIN,TIBC,IRON,RETICCTPCT in the last 72 hours Coagulation: No results found for this basename: LABPROT:2,INR:2 in the last 72 hours Urine Drug Screen: Drugs of Abuse  No results found for this basename: labopia, cocainscrnur, labbenz, amphetmu, thcu, labbarb    Alcohol Level: No results found for this basename: ETH:2 in the last 72 hours Urinalysis: No results found for this basename: COLORURINE:2,APPERANCEUR:2,LABSPEC:2,PHURINE:2,GLUCOSEU:2,HGBUR:2,BILIRUBINUR:2,KETONESUR:2,PROTEINUR:2,UROBILINOGEN:2,NITRITE:2,LEUKOCYTESUR:2 in the last 72 hours  Recent Results (from the past 240 hour(s))  MRSA PCR SCREENING     Status: Normal   Collection Time   11/04/11  1:19 AM      Component Value Range Status Comment   MRSA by PCR NEGATIVE  NEGATIVE Final     Studies/Results: No results found.  Medications: Scheduled Meds:   . atenolol  50 mg Oral Daily  . citalopram  20 mg Oral Daily  .  darifenacin  7.5 mg Oral Daily  . digoxin  0.25 mg Intravenous Once  . digoxin  0.5 mg Intravenous Once  . digoxin  0.25 mg Oral Daily  . furosemide  40 mg Intravenous Q12H  . gabapentin  300 mg Oral TID  . insulin aspart  0-15 Units Subcutaneous TID WC  . potassium chloride  40 mEq Oral BID  . Rivaroxaban  20 mg Oral Daily  . simvastatin  20 mg Oral q1800  . sodium chloride  3 mL Intravenous Q12H  . sodium chloride  3 mL  Intravenous Q12H  . DISCONTD: simvastatin  20 mg Oral q1800   Continuous Infusions:   . DISCONTD: diltiazem (CARDIZEM) infusion Stopped (11/04/11 1100)   PRN Meds:.acetaminophen, acetaminophen, ondansetron (ZOFRAN) IV, ondansetron  Assessment/Plan:  Principal Problem:  *Atrial fibrillation with RVR Active Problems:  Hypertension  Diabetes mellitus, type 2  Chronic anticoagulation  Pulmonary edema  Acute on chronic combined systolic and diastolic CHF (congestive heart failure)  Plan:  This lady was admitted to the hospital with atrial fibrillation with rapid ventricular response. She has a history of chronic combined congestive heart failure. She presented in volume overload and acute on chronic CHF.  #1. Rapid atrial fib. Patient has chronic atrial fibrillation. She is followed at [facility] cardiology clinic. She is normally on atenolol and Cardizem for rate control. She is also on Xarelto anticoagulation. Patient had been noncompliant with her medication at home. She was not sure exactly how to take them. She presented with rapid atrial fibrillation. She required a Cardizem infusion which has since been discontinued. She was restarted on her atenolol. Her heart rate remained elevated in the 120s to 130s, but her blood pressure was in the 90s. Therefore the patient was started on digoxin. It appears that this has helped her heart rate somewhat since it is now down between 100-110. Her blood pressure is better today. We will increase her atenolol. Patient recently had an echocardiogram done in April which showed diastolic dysfunction as well as mild systolic dysfunction with an ejection fraction of 40-45%. It was felt that repeat echo during this admission was not required. She has been followed by [facility] cardiology in the hospital. Patient can transfer to telemetry today.  #2. Acute on chronic combined congestive heart failure. Patient was placed on twice a day Lasix. She has been diuresing  well and her weight is down approximately 6 kg since admission. Her fluid balance is -5 L since admission. She still appears to be volume overloaded, so we will continue with IV Lasix.  #3. Diabetes. Hemoglobin A1c 6.7 indicating good control. She'll be continued on sliding scale insulin for now.   LOS: 3 days   MEMON,JEHANZEB [facility] Pager: [phone] 11/06/2011, 7:58 AM

## 2011-11-07 ENCOUNTER — Inpatient Hospital Stay (HOSPITAL_COMMUNITY): Payer: Medicare Other

## 2011-11-07 DIAGNOSIS — I5043 Acute on chronic combined systolic (congestive) and diastolic (congestive) heart failure: Secondary | ICD-10-CM

## 2011-11-07 LAB — BASIC METABOLIC PANEL
BUN: 20 mg/dL (ref 6–23)
Calcium: 10.2 mg/dL (ref 8.4–10.5)
Creatinine, Ser: 0.83 mg/dL (ref 0.50–1.10)
GFR calc non Af Amer: 69 mL/min — ABNORMAL LOW (ref >90)
Glucose, Bld: 127 mg/dL — ABNORMAL HIGH (ref 70–99)
Sodium: 140 mEq/L (ref 135–145)

## 2011-11-07 LAB — GLUCOSE, CAPILLARY
Glucose-Capillary: 219 mg/dL — ABNORMAL HIGH (ref 70–99)
Glucose-Capillary: 137 mg/dL — ABNORMAL HIGH (ref 70–99)

## 2011-11-07 MED ORDER — METOPROLOL TARTRATE 50 MG PO TABS
50.0000 mg | ORAL_TABLET | Freq: Two times a day (BID) | ORAL | Status: DC
  Administered 2011-11-07 – 2011-11-08 (×3): 50 mg via ORAL
  Filled 2011-11-07 (×3): qty 1

## 2011-11-07 NOTE — Progress Notes (Signed)
Subjective:  Breathing easier. Patient thinks her breathing is back to normal. Just got back from x-ray. Still has leg edema.  Objective:  Vital Signs in the last 24 hours: Temp:  [97.6 F (36.4 C)-97.9 F (36.6 C)] 97.6 F (36.4 C) (07/08 0523) Pulse Rate:  [71-92] 71  (07/08 0720) Resp:  [11-21] 16  (07/08 0523) BP: (103-125)/(72-84) 125/84 mmHg (07/08 0523) SpO2:  [92 %-97 %] 93 % (07/08 0720) Weight:  [186 lb 11.7 oz (84.7 kg)] 186 lb 11.7 oz (84.7 kg) (07/08 0551)  Intake/Output from previous day: 07/07 0701 - 07/08 0700 In: 1444 [P.O.:1440; IV Piggyback:4] Out: 3700 [Urine:3700] Intake/Output from this shift:    Physical Exam: NECK: Without JVD, HJR, or bruit LUNGS: Decreased breath sounds with crackles at the bases HEART: Irregular rate and rhythm, no murmur, gallop, rub, bruit, thrill, or heave EXTREMITIES:+1 edema in the ankles bilaterally, lower extremities Without cyanosis or clubbing  Lab Results:  [facility] 11/05/11 0518  WBC 5.6  HGB 12.0  PLT 191    Basename 11/07/11 0542 11/06/11 0450  NA 140 136  K 4.3 4.7  CL 97 97  CO2 37* 34*  GLUCOSE 127* 123*  BUN 20 19  CREATININE 0.83 0.78    Basename 11/04/11 1735 11/04/11 1003  TROPONINI <0.30 <0.30   Hepatic Function Panel No results found for this basename: PROT,ALBUMIN,AST,ALT,ALKPHOS,BILITOT,BILIDIR,IBILI in the last 72 hours No results found for this basename: CHOL in the last 72 hours No results found for this basename: PROTIME in the last 72 hours  Imaging: CXR 11/07/11: Findings: Trachea is midline. Heart is enlarged. Thoracic aorta is somewhat uncoiled. There has been some interval improvement in interstitial prominence and indistinctness, without complete resolution. Thickening of the minor fissure. Probable tiny left pleural effusion.  IMPRESSION: Improving congestive heart failure.     Cardiac Studies:  Assessment/Plan:  #1Chronic A. fib  Patient was noncompliant at home with  her medications. Her heart rate is still jumping up to 117.On atenolol  100 mg daily and Lanoxin 0.25 mg daily. Will change her to Lopressor 50mg  BID.  #2 acute on chronic combined congestive heart failure continues to diurese on IV Lasix. Weight down 9 pounds from admission but still has some fluid on board. Continue to diurese.  #3 diabetes mellitus type 2   #4 hypertension  #5 chronic anticoagulation  #6 hyperlipidemia    LOS: 4 days    Jacolyn Reedy 11/07/2011, 8:35 AM   Will change to short acting metoprolol tartrate so she can take extra dose when she goes faster and or gets SOB at home. I will try to find her a [facility] primary care provider so she can have a [facility] Care Manager at home. She is high risk for readmission and despite several conversations she still doesn't understand her medication plan.

## 2011-11-07 NOTE — Progress Notes (Signed)
Subjective: This lady feels significantly better. Her breathing is pretty much back to normal. She still on intravenous Lasix.           Physical Exam: Blood pressure 125/84, pulse 71, temperature 97.6 F (36.4 C), temperature source Oral, resp. rate 16, height 5\' 4"  (1.626 m), weight 84.7 kg (186 lb 11.7 oz), SpO2 93.00%. She looks systemically well. Heart sounds are present and in atrial fibrillation but ventricular rate appears to be controlled. Jugular venous pressure not elevated. Lungs are actually clear clinically. There is no much in the way of pitting edema.   Investigations:  Recent Results (from the past 240 hour(s))  MRSA PCR SCREENING     Status: Normal   Collection Time   11/04/11  1:19 AM      Component Value Range Status Comment   MRSA by PCR NEGATIVE  NEGATIVE Final      Basic Metabolic Panel:  Basename 11/07/11 0542 11/06/11 0450 11/05/11 0518 11/04/11 2255  NA 140 136 -- --  K 4.3 4.7 -- --  CL 97 97 -- --  CO2 37* 34* -- --  GLUCOSE 127* 123* -- --  BUN 20 19 -- --  CREATININE 0.83 0.78 -- --  CALCIUM 10.2 10.2 -- --  MG -- -- 1.8 1.8  PHOS -- -- -- --       CBC:  Basename 11/05/11 0518  WBC 5.6  NEUTROABS --  HGB 12.0  HCT 38.3  MCV 93.6  PLT 191    Dg Chest 2 View  11/07/2011  *RADIOLOGY REPORT*  Clinical Data: Congestive heart failure.  CHEST - 2 VIEW  Comparison: 11/03/2011.  Findings: Trachea is midline.  Heart is enlarged.  Thoracic aorta is somewhat uncoiled.  There has been some interval improvement in interstitial prominence and indistinctness, without complete resolution.  Thickening of the minor fissure.  Probable tiny left pleural effusion.  IMPRESSION: Improving congestive heart failure.  Original Report Authenticated By: Reyes Ivan, M.D.      Medications:  Scheduled:   . citalopram  20 mg Oral Daily  . darifenacin  7.5 mg Oral Daily  . digoxin  0.25 mg Oral Daily  . furosemide  40 mg Intravenous Q12H  .  gabapentin  300 mg Oral TID  . insulin aspart  0-15 Units Subcutaneous TID WC  . metoprolol tartrate  50 mg Oral BID  . potassium chloride  40 mEq Oral BID  . Rivaroxaban  20 mg Oral Daily  . simvastatin  20 mg Oral q1800  . sodium chloride  3 mL Intravenous Q12H  . DISCONTD: atenolol  100 mg Oral Daily  . DISCONTD: sodium chloride  3 mL Intravenous Q12H    Impression: 1. Atrial fibrillation with rapid ventricular response, now rate controlled. Chronic anticoagulation with xeralto. 2. Acute on chronic combined systolic and diastolic congestive heart failure, approaching compensation now. 3. Hypertension. 4. Type 2 diabetes mellitus. 5. Noncompliance with medications.     Plan: 1. Continue with current therapy. 2. If she is stable and she is today, I would consider discharge home tomorrow with oral medications.     LOS: 4 days   Wilson Singer Pager [phone]  11/07/2011, 11:45 AM

## 2011-11-08 DIAGNOSIS — Z7901 Long term (current) use of anticoagulants: Secondary | ICD-10-CM

## 2011-11-08 LAB — BASIC METABOLIC PANEL
CO2: 34 mEq/L — ABNORMAL HIGH (ref 19–32)
Chloride: 96 mEq/L (ref 96–112)
Potassium: 4.2 mEq/L (ref 3.5–5.1)
Sodium: 137 mEq/L (ref 135–145)

## 2011-11-08 LAB — GLUCOSE, CAPILLARY
Glucose-Capillary: 123 mg/dL — ABNORMAL HIGH (ref 70–99)
Glucose-Capillary: 161 mg/dL — ABNORMAL HIGH (ref 70–99)

## 2011-11-08 MED ORDER — FUROSEMIDE 40 MG PO TABS: 40.0000 mg | ORAL_TABLET | Freq: Every day | ORAL | Status: DC

## 2011-11-08 MED ORDER — DIGOXIN 250 MCG PO TABS: 0.2500 mg | ORAL_TABLET | Freq: Every day | ORAL | Status: DC

## 2011-11-08 MED ORDER — METOPROLOL TARTRATE 50 MG PO TABS: 50.0000 mg | ORAL_TABLET | Freq: Two times a day (BID) | ORAL | Status: DC

## 2011-11-08 MED ORDER — POTASSIUM CHLORIDE CRYS ER 20 MEQ PO TBCR: 40.0000 meq | EXTENDED_RELEASE_TABLET | Freq: Every day | ORAL | Status: DC

## 2011-11-08 MED ORDER — RIVAROXABAN 20 MG PO TABS: 20.0000 mg | ORAL_TABLET | Freq: Every day | ORAL | Status: DC

## 2011-11-08 NOTE — Discharge Summary (Signed)
Physician Discharge Summary  Patient ID: Christine [name] MRN: [id number] DOB/AGE: 72-Dec-1942 72 y.o. Primary Care Physician: Dr. Jeanice Lim Admit date: 11/03/2011 Discharge date: 11/08/2011    Discharge Diagnoses:  1. Acute on chronic combined congestive heart failure, compensated now. 2. Chronic atrial fibrillation, medications adjusted. 3. Chronic anticoagulation. 4. Hypertension. 5. Type 2 diabetes mellitus. 6. Noncompliance with medications.   Medication List  As of 11/08/2011  8:46 AM   STOP taking these medications         atenolol 50 MG tablet      diltiazem 120 MG 24 hr capsule         TAKE these medications         albuterol 108 (90 BASE) MCG/ACT inhaler   Commonly known as: PROVENTIL HFA;VENTOLIN HFA   Inhale 2 puffs into the lungs every 6 (six) hours as needed. For shortness of breath      aspirin EC 325 MG tablet   Take 325 mg by mouth every morning.      beta carotene w/minerals tablet   Take 1 tablet by mouth every morning.      cetirizine 10 MG tablet   Commonly known as: ZYRTEC   Take 10 mg by mouth daily.      cholecalciferol 1000 UNITS tablet   Commonly known as: VITAMIN D   Take 1,000 Units by mouth daily.      citalopram 20 MG tablet   Commonly known as: CELEXA   Take 20 mg by mouth daily.      digoxin 0.25 MG tablet   Commonly known as: LANOXIN   Take 1 tablet (0.25 mg total) by mouth daily.      furosemide 40 MG tablet   Commonly known as: LASIX   Take 1 tablet (40 mg total) by mouth daily.      meclizine 12.5 MG tablet   Commonly known as: ANTIVERT   Take 12.5 mg by mouth 3 (three) times daily as needed. For dizziness      metoprolol 50 MG tablet   Commonly known as: LOPRESSOR   Take 1 tablet (50 mg total) by mouth 2 (two) times daily.      multivitamin with minerals Tabs   Take 1 tablet by mouth every morning.      potassium chloride SA 20 MEQ tablet   Commonly known as: K-DUR,KLOR-CON   Take 2 tablets (40 mEq total) by mouth  daily.      pravastatin 40 MG tablet   Commonly known as: PRAVACHOL   Take 40 mg by mouth every evening.      Rivaroxaban 20 MG Tabs   Commonly known as: XARELTO   Take 1 tablet (20 mg total) by mouth daily.            Discharged Condition: Stable and improved.    Consults: Cardiology, Dr. Daleen Squibb.  Significant Diagnostic Studies: Dg Chest 2 View  11/07/2011  *RADIOLOGY REPORT*  Clinical Data: Congestive heart failure.  CHEST - 2 VIEW  Comparison: 11/03/2011.  Findings: Trachea is midline.  Heart is enlarged.  Thoracic aorta is somewhat uncoiled.  There has been some interval improvement in interstitial prominence and indistinctness, without complete resolution.  Thickening of the minor fissure.  Probable tiny left pleural effusion.  IMPRESSION: Improving congestive heart failure.  Original Report Authenticated By: Reyes Ivan, M.D.   Dg Chest Port 1 View  11/03/2011  *RADIOLOGY REPORT*  Clinical Data: Shortness of breath  PORTABLE CHEST - 1 VIEW  Comparison: 08/03/2011  Findings: Prominent cardiomediastinal contours.  Pericardial effusion is suggested.  Bibasilar opacities.  Central vascular congestion and perihilar opacities.  Kerley B lines.  Small effusions not excluded.  Osteopenia.  No acute osseous finding.  IMPRESSION: Prominent cardiomediastinal contours with pulmonary edema pattern.  Question pericardial effusion.  Bilateral opacities; atelectasis versus infiltrate.  Original Report Authenticated By: Waneta Martins, M.D.    Lab Results: Basic Metabolic Panel:  Basename 11/08/11 0540 11/07/11 0542  NA 137 140  K 4.2 4.3  CL 96 97  CO2 34* 37*  GLUCOSE 134* 127*  BUN 23 20  CREATININE 0.75 0.83  CALCIUM 10.2 10.2  MG -- --  PHOS -- --          Recent Results (from the past 240 hour(s))  MRSA PCR SCREENING     Status: Normal   Collection Time   11/04/11  1:19 AM      Component Value Range Status Comment   MRSA by PCR NEGATIVE  NEGATIVE Final       Hospital Course: This 71 year old lady presented to the hospital with symptoms of fluttering in her chest and dyspnea. She has a history of chronic atrial fibrillation, hypertension and diabetes. Unfortunately, she has not been very compliant with taking her medications. She was found to be in congestive heart failure with pulmonary edema. She was given intravenous diuretics and medications were adjusted. She was started on digoxin, calcium blocker was discontinued in view of low blood pressure. Atenolol was replaced with metoprolol. She has done extremely well and now she is compensated, feels well and is keen to go home. She has lost approximately 10 pounds since hospitalization.  Discharge Exam: Blood pressure 109/68, pulse 99, temperature 97.8 F (36.6 C), temperature source Oral, resp. rate 16, height 5\' 4"  (1.626 m), weight 82.7 kg (182 lb 5.1 oz), SpO2 92.00%. She looks systemically well. There is no increased work of breathing. Heart sounds are present and in chronic atrial fibrillation. Jugular venous pressure not raised. Lung fields are entirely clear with no evidence of crackles or wheezes. There is some peripheral pitting edema in her ankles still but this is minimal and certainly in much improved since admission. She is alert and orientated without any focal neurological signs.  Disposition: Home. Medications have been adjusted as above. I stressed the importance of being compliant with all her medications. She will need close followup with cardiology within a week or so.  Discharge Orders    Future Appointments: Provider: Department: Dept Phone: Center:   11/09/2011 3:00 PM Kathlen Brunswick, MD [facility] [phone] [id number]     Future Orders Please Complete By Expires   Diet - low sodium heart healthy      Increase activity slowly           Signed: Wilson Singer Pager [phone]  11/08/2011, 8:46 AM

## 2011-11-08 NOTE — Care Management Note (Signed)
    Page 1 of 1   11/08/2011     1:50:46 PM   CARE MANAGEMENT NOTE 11/08/2011  Patient:  Christine [name], Christine [name]   Account Number:  [id number]  Date Initiated:  11/07/2011  Documentation initiated by:  [facility]  Subjective/Objective Assessment:   71 yo from home alone presents to ED with AFRVR and pulm edema on imaging.     Action/Plan:   rate control and diuresis.   Anticipated DC Date:  11/08/2011   Anticipated DC Plan:  HOME W HOME HEALTH SERVICES         Choice offered to / List presented to:          [facility] arranged  HH-1 RN  HH-10 DISEASE MANAGEMENT      HH agency  [facility]   Status of service:  Completed, signed off Medicare Important Message given?   (If response is "NO", the following Medicare IM given date fields will be blank) Date Medicare IM given:   Date Additional Medicare IM given:    Discharge Disposition:  HOME W HOME HEALTH SERVICES  Per UR Regulation:  Reviewed for med. necessity/level of care/duration of stay  If discussed at Long Length of Stay Meetings, dates discussed:   11/08/2011    Comments:  11/08/11 Rosemary Holms RN BSN CM [facility] referral made for [facility] RN for CHF program and medication compliance.  07.08.13 [place] RNBSN ACM [id number]

## 2011-11-08 NOTE — Progress Notes (Signed)
Patient ID: Christine [name], female   DOB: Aug 01, 1940, 70 y.o.   MRN: [id number] Subjective:  Breathing easier. Patient thinks her breathing is back to normal. No chest pain or palpitations  Objective:  Vital Signs in the last 24 hours: Temp:  [97.4 F (36.3 C)-97.8 F (36.6 C)] 97.8 F (36.6 C) (07/09 0459) Pulse Rate:  [73-99] 99  (07/09 0459) Resp:  [16-18] 16  (07/09 0459) BP: (109-112)/(65-71) 109/68 mmHg (07/09 0459) SpO2:  [91 %-96 %] 92 % (07/09 0459) Weight:  [82.7 kg (182 lb 5.1 oz)] 82.7 kg (182 lb 5.1 oz) (07/09 0459)  Intake/Output from previous day: 07/08 0701 - 07/09 0700 In: -  Out: 1000 [Urine:1000] Intake/Output from this shift: Total I/O In: 240 [P.O.:240] Out: -   Physical Exam: NECK: Without JVD, HJR, or bruit LUNGS: Decreased breath sounds with crackles at the bases HEART: Irregular rate and rhythm, no murmur, gallop, rub, bruit, thrill, or heave EXTREMITIES:+1 edema in the ankles bilaterally, lower extremities Without cyanosis or clubbing  Lab Results: No results found for this basename: WBC:2,HGB:2,PLT:2 in the last 72 hours  Basename 11/08/11 0540 11/07/11 0542  NA 137 140  K 4.2 4.3  CL 96 97  CO2 34* 37*  GLUCOSE 134* 127*  BUN 23 20  CREATININE 0.75 0.83   No results found for this basename: TROPONINI:2,CK,MB:2 in the last 72 hours Hepatic Function Panel No results found for this basename: PROT,ALBUMIN,AST,ALT,ALKPHOS,BILITOT,BILIDIR,IBILI in the last 72 hours No results found for this basename: CHOL in the last 72 hours No results found for this basename: PROTIME in the last 72 hours  Imaging: CXR 11/07/11: Findings: Trachea is midline. Heart is enlarged. Thoracic aorta is somewhat uncoiled. There has been some interval improvement in interstitial prominence and indistinctness, without complete resolution. Thickening of the minor fissure. Probable tiny left pleural effusion.  IMPRESSION: Improving congestive heart  failure.     Cardiac Studies:  Assessment/Plan:  #1Chronic A. fib  Patient was noncompliant at home with her medications.  Beta blocker changed yesterday to lopressor 50 bid  #2 acute on chronic combined congestive heart failure continues to diurese on lasix.    #3 diabetes mellitus type 2   #4 hypertension  #5 chronic anticoagulation  #6 hyperlipidemia  Seems ready for D/C and outpatient F/U   LOS: 5 days    Charlton Haws 11/08/2011, 9:40 AM   Will change to short acting metoprolol tartrate so she can take extra dose when she goes faster and or gets SOB at home. I will try to find her a [facility] primary care provider so she can have a [facility] Care Manager at home. She is high risk for readmission and despite several conversations she still doesn't understand her medication plan.

## 2011-11-09 ENCOUNTER — Ambulatory Visit: Payer: Medicare Other | Admitting: Cardiology

## 2011-12-01 ENCOUNTER — Ambulatory Visit (INDEPENDENT_AMBULATORY_CARE_PROVIDER_SITE_OTHER): Payer: Medicare Other | Admitting: Cardiology

## 2011-12-01 ENCOUNTER — Encounter: Payer: Self-pay | Admitting: Cardiology

## 2011-12-01 ENCOUNTER — Encounter: Payer: Self-pay | Admitting: *Deleted

## 2011-12-01 VITALS — BP 120/90 | HR 62 | Ht 65.0 in | Wt 177.0 lb

## 2011-12-01 DIAGNOSIS — Z7901 Long term (current) use of anticoagulants: Secondary | ICD-10-CM

## 2011-12-01 DIAGNOSIS — I4891 Unspecified atrial fibrillation: Secondary | ICD-10-CM

## 2011-12-01 DIAGNOSIS — J811 Chronic pulmonary edema: Secondary | ICD-10-CM

## 2011-12-01 DIAGNOSIS — E785 Hyperlipidemia, unspecified: Secondary | ICD-10-CM

## 2011-12-01 DIAGNOSIS — I10 Essential (primary) hypertension: Secondary | ICD-10-CM

## 2011-12-01 DIAGNOSIS — E119 Type 2 diabetes mellitus without complications: Secondary | ICD-10-CM

## 2011-12-01 NOTE — Progress Notes (Signed)
Patient ID: Christine [name], female   DOB: July 18, 1940, 71 y.o.   MRN: [id number]  HPI: Scheduled return visit for this nice woman with atrial fibrillation.  Since her last visit, she has done very well.  She notes no palpitations, resolution of edema and no dyspnea nor chest discomfort.  Prior to Admission medications   Medication Sig Start Date End Date Taking? Authorizing Provider  albuterol (PROVENTIL HFA;VENTOLIN HFA) 108 (90 BASE) MCG/ACT inhaler Inhale 2 puffs into the lungs every 6 (six) hours as needed. For shortness of breath   Yes Historical Provider, MD  beta carotene w/minerals (OCUVITE) tablet Take 1 tablet by mouth every morning.   Yes Historical Provider, MD  cetirizine (ZYRTEC) 10 MG tablet Take 10 mg by mouth daily.   Yes Historical Provider, MD  cholecalciferol (VITAMIN D) 1000 UNITS tablet Take 1,000 Units by mouth daily.   Yes Historical Provider, MD  citalopram (CELEXA) 20 MG tablet Take 20 mg by mouth daily.   Yes Historical Provider, MD  digoxin (LANOXIN) 0.25 MG tablet Take 1 tablet (0.25 mg total) by mouth daily. 11/08/11 11/07/12 Yes Nimish Normajean Glasgow, MD  furosemide (LASIX) 40 MG tablet Take 1 tablet (40 mg total) by mouth daily. 11/08/11 11/07/12 Yes Nimish Normajean Glasgow, MD  meclizine (ANTIVERT) 12.5 MG tablet Take 12.5 mg by mouth 3 (three) times daily as needed. For dizziness   Yes Historical Provider, MD  metoprolol (LOPRESSOR) 50 MG tablet Take 1 tablet (50 mg total) by mouth 2 (two) times daily. 11/08/11 11/07/12 Yes Nimish Normajean Glasgow, MD  Multiple Vitamin (MULTIVITAMIN WITH MINERALS) TABS Take 1 tablet by mouth every morning.   Yes Historical Provider, MD  potassium chloride SA (K-DUR,KLOR-CON) 20 MEQ tablet Take 2 tablets (40 mEq total) by mouth daily. 11/08/11 11/07/12 Yes Nimish Normajean Glasgow, MD  pravastatin (PRAVACHOL) 40 MG tablet Take 40 mg by mouth every evening.    Yes Historical Provider, MD  rivaroxaban (XARELTO) 20 MG TABS Take 1 tablet (20 mg total) by mouth daily. 11/08/11  Yes  Nimish Normajean Glasgow, MD   Allergies  Allergen Reactions  . Codeine Anaphylaxis and Hives  . Morphine And Related Anaphylaxis and Hives  . Penicillins Anaphylaxis  . Ace Inhibitors Cough     Past medical history, social history, and family history reviewed and updated.  ROS: Denies orthopnea, PND or syncope.  All other systems reviewed and are negative.  PHYSICAL EXAM: BP 120/90  Pulse 62  Ht 5\' 5"  (1.651 m)  Wt 80.287 kg (177 lb)  BMI 29.45 kg/m2  SpO2 94%; orthostasis of 12 mmHg with standing  General-Well developed; no acute distress Body habitus-Mildly to moderately overweight Neck-No JVD; no carotid bruits Lungs-clear lung fields; resonant to percussion Cardiovascular-normal PMI; normal S1 and S2; irregular rhythm; apical rate of 75 Abdomen-normal bowel sounds; soft and non-tender without masses or organomegaly Musculoskeletal-No deformities, no cyanosis or clubbing Neurologic-Normal cranial nerves; symmetric strength and tone Skin-Warm, no significant lesions Extremities-distal pulses intact; Trace edema ASSESSMENT AND PLAN:  Packwood Bing, MD 12/01/2011 3:17 PM

## 2011-12-01 NOTE — Assessment & Plan Note (Addendum)
All blood pressure determinations obtained in Epic have been normal.  At this visit, blood pressure systolic decreases 12 mmHg with standing.  This may account for the patient's minor orthostatic dizziness.  Behavioral methods for dealing with her symptoms were reviewed with her.

## 2011-12-01 NOTE — Assessment & Plan Note (Signed)
Patient is asymptomatic with control of heart rate.  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  In the absence of symptoms of congestive heart failure, further testing is not necessary.

## 2011-12-01 NOTE — Assessment & Plan Note (Signed)
Lipid profile 2 months ago indicates excellent control of hyperlipidemia.

## 2011-12-01 NOTE — Assessment & Plan Note (Addendum)
CHF at presentation with BNP level of 2784 apparently mostly related to atrial fibrillation with a rapid ventricular response.  The only specific therapy she is taking now for this problem his diuretic, which will be continued at the current fairly modest dose.  Maintenance of normal electrolytes and renal function will be verified periodically.

## 2011-12-01 NOTE — Progress Notes (Deleted)
Name: Christine [name]    DOB: 1941-04-27  Age: 71 y.o.  MR#: [id number]       PCP:  Ernestine Conrad, MD      Insurance: @PAYORNAME @   CC:    Chief Complaint  Patient presents with  . Atrial Fibrillation    No change in status - Weight chart provided and med list reviewed/TC  . Hypertension  . Shortness of Breath    VS BP 122/78  Pulse 83  Ht 5\' 5"  (1.651 m)  Wt 177 lb (80.287 kg)  BMI 29.45 kg/m2  SpO2 94%  Weights Current Weight  12/01/11 177 lb (80.287 kg)  11/08/11 182 lb 5.1 oz (82.7 kg)  09/21/11 189 lb (85.73 kg)    Blood Pressure  BP Readings from Last 3 Encounters:  12/01/11 122/78  11/08/11 109/78  09/21/11 130/80     Admit date:  (Not on file) Last encounter with RMR:  10/25/2011   Allergy Allergies  Allergen Reactions  . Codeine Anaphylaxis and Hives  . Morphine And Related Anaphylaxis and Hives  . Penicillins Anaphylaxis  . Ace Inhibitors Cough    Current Outpatient Prescriptions  Medication Sig Dispense Refill  . albuterol (PROVENTIL HFA;VENTOLIN HFA) 108 (90 BASE) MCG/ACT inhaler Inhale 2 puffs into the lungs every 6 (six) hours as needed. For shortness of breath      . aspirin EC 325 MG tablet Take 325 mg by mouth every morning.      . beta carotene w/minerals (OCUVITE) tablet Take 1 tablet by mouth every morning.      . cetirizine (ZYRTEC) 10 MG tablet Take 10 mg by mouth daily.      . cholecalciferol (VITAMIN D) 1000 UNITS tablet Take 1,000 Units by mouth daily.      . citalopram (CELEXA) 20 MG tablet Take 20 mg by mouth daily.      . digoxin (LANOXIN) 0.25 MG tablet Take 1 tablet (0.25 mg total) by mouth daily.  30 tablet  0  . furosemide (LASIX) 40 MG tablet Take 1 tablet (40 mg total) by mouth daily.  30 tablet  0  . meclizine (ANTIVERT) 12.5 MG tablet Take 12.5 mg by mouth 3 (three) times daily as needed. For dizziness      . metoprolol (LOPRESSOR) 50 MG tablet Take 1 tablet (50 mg total) by mouth 2 (two) times daily.  60 tablet  0  . Multiple  Vitamin (MULTIVITAMIN WITH MINERALS) TABS Take 1 tablet by mouth every morning.      . potassium chloride SA (K-DUR,KLOR-CON) 20 MEQ tablet Take 2 tablets (40 mEq total) by mouth daily.  60 tablet  0  . pravastatin (PRAVACHOL) 40 MG tablet Take 40 mg by mouth every evening.       . rivaroxaban (XARELTO) 20 MG TABS Take 1 tablet (20 mg total) by mouth daily.  30 tablet  0    Discontinued Meds:   There are no discontinued medications.  Patient Active Problem List  Diagnosis  . Hypertension  . Hyperlipidemia  . Dyspnea on exertion  . Diabetes mellitus, type 2  . Atrial fibrillation  . Chronic anticoagulation  . Atrial fibrillation with RVR  . Pulmonary edema  . Acute on chronic combined systolic and diastolic CHF (congestive heart failure)    LABS Admission on 11/03/2011, Discharged on 11/08/2011  Component Date Value  . WBC 11/03/2011 6.7   . RBC 11/03/2011 4.17   . Hemoglobin 11/03/2011 12.3   . HCT 11/03/2011 38.2   .  MCV 11/03/2011 91.6   . [facility] 11/03/2011 29.5   . MCHC 11/03/2011 32.2   . RDW 11/03/2011 14.1   . Platelets 11/03/2011 179   . Neutrophils Relative 11/03/2011 65   . Neutro Abs 11/03/2011 4.3   . Lymphocytes Relative 11/03/2011 23   . Lymphs Abs 11/03/2011 1.6   . Monocytes Relative 11/03/2011 10   . Monocytes Absolute 11/03/2011 0.6   . Eosinophils Relative 11/03/2011 2   . Eosinophils Absolute 11/03/2011 0.2   . Basophils Relative 11/03/2011 0   . Basophils Absolute 11/03/2011 0.0   . Sodium 11/03/2011 141   . Potassium 11/03/2011 3.5   . Chloride 11/03/2011 106   . CO2 11/03/2011 25   . Glucose, Bld 11/03/2011 131*  . BUN 11/03/2011 18   . Creatinine, Ser 11/03/2011 0.71   . Calcium 11/03/2011 9.4   . GFR calc non Af Amer 11/03/2011 85*  . GFR calc Af Amer 11/03/2011 >90   . Pro B Natriuretic peptid* 11/03/2011 6519.0*  . MRSA by PCR 11/04/2011 NEGATIVE   . Total CK 11/04/2011 76   . CK, MB 11/04/2011 2.8   . Troponin I 11/04/2011 <0.30   .  Relative Index 11/04/2011 RELATIVE INDEX IS INVALID   . Total CK 11/04/2011 62   . CK, MB 11/04/2011 2.4   . Troponin I 11/04/2011 <0.30   . Relative Index 11/04/2011 RELATIVE INDEX IS INVALID   . Total CK 11/04/2011 48   . CK, MB 11/04/2011 1.8   . Troponin I 11/04/2011 <0.30   . Relative Index 11/04/2011 RELATIVE INDEX IS INVALID   . Sodium 11/04/2011 139   . Potassium 11/04/2011 3.4*  . Chloride 11/04/2011 102   . CO2 11/04/2011 28   . Glucose, Bld 11/04/2011 171*  . BUN 11/04/2011 16   . Creatinine, Ser 11/04/2011 0.71   . Calcium 11/04/2011 9.4   . Total Protein 11/04/2011 7.1   . Albumin 11/04/2011 3.4*  . AST 11/04/2011 75*  . ALT 11/04/2011 79*  . Alkaline Phosphatase 11/04/2011 67   . Total Bilirubin 11/04/2011 0.7   . GFR calc non Af Amer 11/04/2011 85*  . GFR calc Af Amer 11/04/2011 >90   . WBC 11/04/2011 7.3   . RBC 11/04/2011 4.24   . Hemoglobin 11/04/2011 12.6   . HCT 11/04/2011 38.9   . MCV 11/04/2011 91.7   . [facility] 11/04/2011 29.7   . MCHC 11/04/2011 32.4   . RDW 11/04/2011 14.1   . Platelets 11/04/2011 185   . Hemoglobin A1C 11/04/2011 6.7*  . Mean Plasma Glucose 11/04/2011 146*  . Glucose-Capillary 11/04/2011 151*  . Glucose-Capillary 11/04/2011 127*  . Comment 1 11/04/2011 Notify RN   . Glucose-Capillary 11/04/2011 110*  . Comment 1 11/04/2011 Notify RN   . Sodium 11/05/2011 139   . Potassium 11/05/2011 4.2   . Chloride 11/05/2011 103   . CO2 11/05/2011 28   . Glucose, Bld 11/05/2011 115*  . BUN 11/05/2011 22   . Creatinine, Ser 11/05/2011 0.90   . Calcium 11/05/2011 9.2   . GFR calc non Af Amer 11/05/2011 63*  . GFR calc Af Amer 11/05/2011 73*  . WBC 11/05/2011 5.6   . RBC 11/05/2011 4.09   . Hemoglobin 11/05/2011 12.0   . HCT 11/05/2011 38.3   . MCV 11/05/2011 93.6   . [facility] 11/05/2011 29.3   . MCHC 11/05/2011 31.3   . RDW 11/05/2011 14.2   . Platelets 11/05/2011 191   . Magnesium 11/05/2011 1.8   .  Glucose-Capillary 11/04/2011 170*  .  Comment 1 11/04/2011 Notify RN   . Comment 2 11/04/2011 Documented in Chart   . Glucose-Capillary 11/04/2011 124*  . Comment 1 11/04/2011 Documented in Chart   . Comment 2 11/04/2011 Notify RN   . Sodium 11/04/2011 137   . Potassium 11/04/2011 4.0   . Chloride 11/04/2011 101   . CO2 11/04/2011 27   . Glucose, Bld 11/04/2011 123*  . BUN 11/04/2011 24*  . Creatinine, Ser 11/04/2011 1.00   . Calcium 11/04/2011 9.0   . GFR calc non Af Amer 11/04/2011 55*  . GFR calc Af Amer 11/04/2011 64*  . Magnesium 11/04/2011 1.8   . Glucose-Capillary 11/05/2011 116*  . Comment 1 11/05/2011 Documented in Chart   . Comment 2 11/05/2011 Notify RN   . Glucose-Capillary 11/05/2011 131*  . Comment 1 11/05/2011 Notify RN   . Comment 2 11/05/2011 Documented in Chart   . Glucose-Capillary 11/05/2011 88   . Comment 1 11/05/2011 Notify RN   . Comment 2 11/05/2011 Documented in Chart   . Sodium 11/06/2011 136   . Potassium 11/06/2011 4.7   . Chloride 11/06/2011 97   . CO2 11/06/2011 34*  . Glucose, Bld 11/06/2011 123*  . BUN 11/06/2011 19   . Creatinine, Ser 11/06/2011 0.78   . Calcium 11/06/2011 10.2   . GFR calc non Af Amer 11/06/2011 82*  . GFR calc Af Amer 11/06/2011 >90   . Glucose-Capillary 11/05/2011 143*  . Comment 1 11/05/2011 Documented in Chart   . Comment 2 11/05/2011 Notify RN   . Glucose-Capillary 11/06/2011 118*  . Comment 1 11/06/2011 Notify RN   . Comment 2 11/06/2011 Documented in Chart   . Glucose-Capillary 11/06/2011 102*  . Comment 1 11/06/2011 Notify RN   . Comment 2 11/06/2011 Documented in Chart   . Glucose-Capillary 11/06/2011 116*  . Comment 1 11/06/2011 Documented in Chart   . Comment 2 11/06/2011 Notify RN   . Sodium 11/07/2011 140   . Potassium 11/07/2011 4.3   . Chloride 11/07/2011 97   . CO2 11/07/2011 37*  . Glucose, Bld 11/07/2011 127*  . BUN 11/07/2011 20   . Creatinine, Ser 11/07/2011 0.83   . Calcium 11/07/2011 10.2   . GFR calc non Af Amer 11/07/2011 69*   . GFR calc Af Amer 11/07/2011 80*  . Pro B Natriuretic peptid* 11/07/2011 2784.0*  . Glucose-Capillary 11/06/2011 209*  . Comment 1 11/06/2011 Notify RN   . Glucose-Capillary 11/07/2011 149*  . Glucose-Capillary 11/07/2011 91   . Glucose-Capillary 11/07/2011 219*  . Sodium 11/08/2011 137   . Potassium 11/08/2011 4.2   . Chloride 11/08/2011 96   . CO2 11/08/2011 34*  . Glucose, Bld 11/08/2011 134*  . BUN 11/08/2011 23   . Creatinine, Ser 11/08/2011 0.75   . Calcium 11/08/2011 10.2   . GFR calc non Af Amer 11/08/2011 83*  . GFR calc Af Amer 11/08/2011 >90   . Glucose-Capillary 11/07/2011 137*  . Glucose-Capillary 11/08/2011 123*  . Glucose-Capillary 11/08/2011 161*  Office Visit on 09/21/2011  Component Date Value  . Cholesterol 09/21/2011 153   . Triglycerides 09/21/2011 148   . HDL 09/21/2011 47   . Total CHOL/HDL Ratio 09/21/2011 3.3   . VLDL 09/21/2011 30   . LDL Cholesterol 09/21/2011 76   . Prothrombin Time 09/21/2011 14.4   . INR 09/21/2011 1.08   . aPTT 09/21/2011 26   . Brain Natriuretic Peptide 09/22/2011 675.0*  Office Visit on 09/02/2011  Component  Date Value  . TSH 09/09/2011 2.936   . WBC 09/09/2011 6.3   . RBC 09/09/2011 4.30   . Hemoglobin 09/09/2011 12.7   . HCT 09/09/2011 39.9   . MCV 09/09/2011 92.8   . MCH 09/09/2011 29.5   . MCHC 09/09/2011 31.8   . RDW 09/09/2011 13.6   . Platelets 09/09/2011 225   . Sodium 09/09/2011 141   . Potassium 09/09/2011 4.7   . Chloride 09/09/2011 103   . CO2 09/09/2011 27   . Glucose, Bld 09/09/2011 145*  . BUN 09/09/2011 19   . Creat 09/09/2011 1.02   . Total Bilirubin 09/09/2011 0.5   . Alkaline Phosphatase 09/09/2011 64   . AST 09/09/2011 26   . ALT 09/09/2011 30   . Total Protein 09/09/2011 6.9   . Albumin 09/09/2011 4.0   . Calcium 09/09/2011 9.7      Results for this Opt Visit:     Results for orders placed during the hospital encounter of 11/03/11  CBC WITH DIFFERENTIAL      Component Value Range     WBC 6.7  4.0 - 10.5 K/uL   RBC 4.17  3.87 - 5.11 MIL/uL   Hemoglobin 12.3  12.0 - 15.0 g/dL   HCT 16.1  09.6 - 04.5 %   MCV 91.6  78.0 - 100.0 fL   MCH 29.5  26.0 - 34.0 pg   MCHC 32.2  30.0 - 36.0 g/dL   RDW 40.9  81.1 - 91.4 %   Platelets 179  150 - 400 K/uL   Neutrophils Relative 65  43 - 77 %   Neutro Abs 4.3  1.7 - 7.7 K/uL   Lymphocytes Relative 23  12 - 46 %   Lymphs Abs 1.6  0.7 - 4.0 K/uL   Monocytes Relative 10  3 - 12 %   Monocytes Absolute 0.6  0.1 - 1.0 K/uL   Eosinophils Relative 2  0 - 5 %   Eosinophils Absolute 0.2  0.0 - 0.7 K/uL   Basophils Relative 0  0 - 1 %   Basophils Absolute 0.0  0.0 - 0.1 K/uL  BASIC METABOLIC PANEL      Component Value Range   Sodium 141  135 - 145 mEq/L   Potassium 3.5  3.5 - 5.1 mEq/L   Chloride 106  96 - 112 mEq/L   CO2 25  19 - 32 mEq/L   Glucose, Bld 131 (*) 70 - 99 mg/dL   BUN 18  6 - 23 mg/dL   Creatinine, Ser 7.82  0.50 - 1.10 mg/dL   Calcium 9.4  8.4 - 95.6 mg/dL   GFR calc non Af Amer 85 (*) >90 mL/min   GFR calc Af Amer >90  >90 mL/min  PRO B NATRIURETIC PEPTIDE      Component Value Range   Pro B Natriuretic peptide (BNP) 6519.0 (*) 0 - 125 pg/mL  MRSA PCR SCREENING      Component Value Range   MRSA by PCR NEGATIVE  NEGATIVE  CARDIAC PANEL(CRET KIN+CKTOT+MB+TROPI)      Component Value Range   Total CK 76  7 - 177 U/L   CK, MB 2.8  0.3 - 4.0 ng/mL   Troponin I <0.30  <0.30 ng/mL   Relative Index RELATIVE INDEX IS INVALID  0.0 - 2.5  CARDIAC PANEL(CRET KIN+CKTOT+MB+TROPI)      Component Value Range   Total CK 62  7 - 177 U/L   CK, MB 2.4  0.3 - 4.0 ng/mL   Troponin I <0.30  <0.30 ng/mL   Relative Index RELATIVE INDEX IS INVALID  0.0 - 2.5  CARDIAC PANEL(CRET KIN+CKTOT+MB+TROPI)      Component Value Range   Total CK 48  7 - 177 U/L   CK, MB 1.8  0.3 - 4.0 ng/mL   Troponin I <0.30  <0.30 ng/mL   Relative Index RELATIVE INDEX IS INVALID  0.0 - 2.5  COMPREHENSIVE METABOLIC PANEL      Component Value Range    Sodium 139  135 - 145 mEq/L   Potassium 3.4 (*) 3.5 - 5.1 mEq/L   Chloride 102  96 - 112 mEq/L   CO2 28  19 - 32 mEq/L   Glucose, Bld 171 (*) 70 - 99 mg/dL   BUN 16  6 - 23 mg/dL   Creatinine, Ser 1.61  0.50 - 1.10 mg/dL   Calcium 9.4  8.4 - 09.6 mg/dL   Total Protein 7.1  6.0 - 8.3 g/dL   Albumin 3.4 (*) 3.5 - 5.2 g/dL   AST 75 (*) 0 - 37 U/L   ALT 79 (*) 0 - 35 U/L   Alkaline Phosphatase 67  39 - 117 U/L   Total Bilirubin 0.7  0.3 - 1.2 mg/dL   GFR calc non Af Amer 85 (*) >90 mL/min   GFR calc Af Amer >90  >90 mL/min  CBC      Component Value Range   WBC 7.3  4.0 - 10.5 K/uL   RBC 4.24  3.87 - 5.11 MIL/uL   Hemoglobin 12.6  12.0 - 15.0 g/dL   HCT 04.5  40.9 - 81.1 %   MCV 91.7  78.0 - 100.0 fL   MCH 29.7  26.0 - 34.0 pg   MCHC 32.4  30.0 - 36.0 g/dL   RDW 91.4  78.2 - 95.6 %   Platelets 185  150 - 400 K/uL  HEMOGLOBIN A1C      Component Value Range   Hemoglobin A1C 6.7 (*) <5.7 %   Mean Plasma Glucose 146 (*) <117 mg/dL  GLUCOSE, CAPILLARY      Component Value Range   Glucose-Capillary 151 (*) 70 - 99 mg/dL  GLUCOSE, CAPILLARY      Component Value Range   Glucose-Capillary 127 (*) 70 - 99 mg/dL   Comment 1 Notify RN    GLUCOSE, CAPILLARY      Component Value Range   Glucose-Capillary 110 (*) 70 - 99 mg/dL   Comment 1 Notify RN    BASIC METABOLIC PANEL      Component Value Range   Sodium 139  135 - 145 mEq/L   Potassium 4.2  3.5 - 5.1 mEq/L   Chloride 103  96 - 112 mEq/L   CO2 28  19 - 32 mEq/L   Glucose, Bld 115 (*) 70 - 99 mg/dL   BUN 22  6 - 23 mg/dL   Creatinine, Ser 2.13  0.50 - 1.10 mg/dL   Calcium 9.2  8.4 - 08.6 mg/dL   GFR calc non Af Amer 63 (*) >90 mL/min   GFR calc Af Amer 73 (*) >90 mL/min  CBC      Component Value Range   WBC 5.6  4.0 - 10.5 K/uL   RBC 4.09  3.87 - 5.11 MIL/uL   Hemoglobin 12.0  12.0 - 15.0 g/dL   HCT 57.8  46.9 - 62.9 %   MCV 93.6  78.0 - 100.0 fL  MCH 29.3  26.0 - 34.0 pg   MCHC 31.3  30.0 - 36.0 g/dL   RDW 16.1  09.6 -  04.5 %   Platelets 191  150 - 400 K/uL  MAGNESIUM      Component Value Range   Magnesium 1.8  1.5 - 2.5 mg/dL  GLUCOSE, CAPILLARY      Component Value Range   Glucose-Capillary 170 (*) 70 - 99 mg/dL   Comment 1 Notify RN     Comment 2 Documented in Chart    GLUCOSE, CAPILLARY      Component Value Range   Glucose-Capillary 124 (*) 70 - 99 mg/dL   Comment 1 Documented in Chart     Comment 2 Notify RN    BASIC METABOLIC PANEL      Component Value Range   Sodium 137  135 - 145 mEq/L   Potassium 4.0  3.5 - 5.1 mEq/L   Chloride 101  96 - 112 mEq/L   CO2 27  19 - 32 mEq/L   Glucose, Bld 123 (*) 70 - 99 mg/dL   BUN 24 (*) 6 - 23 mg/dL   Creatinine, Ser 4.09  0.50 - 1.10 mg/dL   Calcium 9.0  8.4 - 81.1 mg/dL   GFR calc non Af Amer 55 (*) >90 mL/min   GFR calc Af Amer 64 (*) >90 mL/min  MAGNESIUM      Component Value Range   Magnesium 1.8  1.5 - 2.5 mg/dL  GLUCOSE, CAPILLARY      Component Value Range   Glucose-Capillary 116 (*) 70 - 99 mg/dL   Comment 1 Documented in Chart     Comment 2 Notify RN    GLUCOSE, CAPILLARY      Component Value Range   Glucose-Capillary 131 (*) 70 - 99 mg/dL   Comment 1 Notify RN     Comment 2 Documented in Chart    GLUCOSE, CAPILLARY      Component Value Range   Glucose-Capillary 88  70 - 99 mg/dL   Comment 1 Notify RN     Comment 2 Documented in Chart    BASIC METABOLIC PANEL      Component Value Range   Sodium 136  135 - 145 mEq/L   Potassium 4.7  3.5 - 5.1 mEq/L   Chloride 97  96 - 112 mEq/L   CO2 34 (*) 19 - 32 mEq/L   Glucose, Bld 123 (*) 70 - 99 mg/dL   BUN 19  6 - 23 mg/dL   Creatinine, Ser 9.14  0.50 - 1.10 mg/dL   Calcium 78.2  8.4 - 95.6 mg/dL   GFR calc non Af Amer 82 (*) >90 mL/min   GFR calc Af Amer >90  >90 mL/min  GLUCOSE, CAPILLARY      Component Value Range   Glucose-Capillary 143 (*) 70 - 99 mg/dL   Comment 1 Documented in Chart     Comment 2 Notify RN    GLUCOSE, CAPILLARY      Component Value Range    Glucose-Capillary 118 (*) 70 - 99 mg/dL   Comment 1 Notify RN     Comment 2 Documented in Chart    GLUCOSE, CAPILLARY      Component Value Range   Glucose-Capillary 102 (*) 70 - 99 mg/dL   Comment 1 Notify RN     Comment 2 Documented in Chart    GLUCOSE, CAPILLARY      Component Value Range   Glucose-Capillary 116 (*)  70 - 99 mg/dL   Comment 1 Documented in Chart     Comment 2 Notify RN    BASIC METABOLIC PANEL      Component Value Range   Sodium 140  135 - 145 mEq/L   Potassium 4.3  3.5 - 5.1 mEq/L   Chloride 97  96 - 112 mEq/L   CO2 37 (*) 19 - 32 mEq/L   Glucose, Bld 127 (*) 70 - 99 mg/dL   BUN 20  6 - 23 mg/dL   Creatinine, Ser 3.66  0.50 - 1.10 mg/dL   Calcium 44.0  8.4 - 34.7 mg/dL   GFR calc non Af Amer 69 (*) >90 mL/min   GFR calc Af Amer 80 (*) >90 mL/min  PRO B NATRIURETIC PEPTIDE      Component Value Range   Pro B Natriuretic peptide (BNP) 2784.0 (*) 0 - 125 pg/mL  GLUCOSE, CAPILLARY      Component Value Range   Glucose-Capillary 209 (*) 70 - 99 mg/dL   Comment 1 Notify RN    GLUCOSE, CAPILLARY      Component Value Range   Glucose-Capillary 149 (*) 70 - 99 mg/dL  GLUCOSE, CAPILLARY      Component Value Range   Glucose-Capillary 91  70 - 99 mg/dL  GLUCOSE, CAPILLARY      Component Value Range   Glucose-Capillary 219 (*) 70 - 99 mg/dL  BASIC METABOLIC PANEL      Component Value Range   Sodium 137  135 - 145 mEq/L   Potassium 4.2  3.5 - 5.1 mEq/L   Chloride 96  96 - 112 mEq/L   CO2 34 (*) 19 - 32 mEq/L   Glucose, Bld 134 (*) 70 - 99 mg/dL   BUN 23  6 - 23 mg/dL   Creatinine, Ser 4.25  0.50 - 1.10 mg/dL   Calcium 95.6  8.4 - 38.7 mg/dL   GFR calc non Af Amer 83 (*) >90 mL/min   GFR calc Af Amer >90  >90 mL/min  GLUCOSE, CAPILLARY      Component Value Range   Glucose-Capillary 137 (*) 70 - 99 mg/dL  GLUCOSE, CAPILLARY      Component Value Range   Glucose-Capillary 123 (*) 70 - 99 mg/dL  GLUCOSE, CAPILLARY      Component Value Range    Glucose-Capillary 161 (*) 70 - 99 mg/dL    EKG Orders placed during the hospital encounter of 11/03/11  . EKG 12-LEAD  . EKG 12-LEAD  . EKG  . EKG 12-LEAD  . EKG 12-LEAD     Prior Assessment and Plan Problem List as of 12/01/2011            Cardiology Problems   Hypertension   Last Assessment & Plan Note   09/21/2011 Office Visit Signed 09/21/2011  3:37 PM by Kathlen Brunswick, MD    Blood pressure has been normal with current medications and should improve further as we Increase the dose of AV nodal blocking agents.    Hyperlipidemia   Last Assessment & Plan Note   09/21/2011 Office Visit Signed 09/21/2011  3:36 PM by Kathlen Brunswick, MD    Fasting lipid profile was ordered, but not performed by the lab.  We will submit another request for that study.    Atrial fibrillation   Last Assessment & Plan Note   09/21/2011 Office Visit Addendum 09/24/2011 10:48 AM by Kathlen Brunswick, MD    Patient is symptomatically improved despite  having a higher ventricular rate after starting diltiazem than was initially present.  There is a substantial increase in heart rate with exertion, but nothing excessive.  Exercise tolerance is impaired, likely due to physical deconditioning.  Atenolol will be increased to 100 mg per day and diltiazem discontinued.  Patient is advised to walk on a daily basis.  She will return in one week for assessment of heart rate and blood pressure and adjustment of medications by the cardiology nurses and to see me in one month.    Atrial fibrillation with RVR   Acute on chronic combined systolic and diastolic CHF (congestive heart failure)     Other   Dyspnea on exertion   Last Assessment & Plan Note   09/21/2011 Office Visit Addendum 09/21/2011  3:35 PM by Kathlen Brunswick, MD    Pedal edema is not markedly improved, but patient is more satisfied, and weight has decreased 1 pound.  We will continue furosemide 40 mg per day for now.  Dyspnea on exertion is stable to  improved.  A BNP level will be obtained to further assess for possible congestive heart failure.  Patient encouraged to increase her level of activity.    Diabetes mellitus, type 2   Chronic anticoagulation   Last Assessment & Plan Note   09/21/2011 Office Visit Addendum 09/24/2011 10:49 AM by Kathlen Brunswick, MD    PT and PTT will be obtained to document anticoagulant effect.    Pulmonary edema       Imaging: Dg Chest 2 View  11/07/2011  *RADIOLOGY REPORT*  Clinical Data: Congestive heart failure.  CHEST - 2 VIEW  Comparison: 11/03/2011.  Findings: Trachea is midline.  Heart is enlarged.  Thoracic aorta is somewhat uncoiled.  There has been some interval improvement in interstitial prominence and indistinctness, without complete resolution.  Thickening of the minor fissure.  Probable tiny left pleural effusion.  IMPRESSION: Improving congestive heart failure.  Original Report Authenticated By: Reyes Ivan, M.D.   Dg Chest Port 1 View  11/03/2011  *RADIOLOGY REPORT*  Clinical Data: Shortness of breath  PORTABLE CHEST - 1 VIEW  Comparison: 08/03/2011  Findings: Prominent cardiomediastinal contours.  Pericardial effusion is suggested.  Bibasilar opacities.  Central vascular congestion and perihilar opacities.  Kerley B lines.  Small effusions not excluded.  Osteopenia.  No acute osseous finding.  IMPRESSION: Prominent cardiomediastinal contours with pulmonary edema pattern.  Question pericardial effusion.  Bilateral opacities; atelectasis versus infiltrate.  Original Report Authenticated By: Waneta Martins, M.D.     [facility] Calculation: Score not calculated. Missing: Total Cholesterol

## 2011-12-01 NOTE — Assessment & Plan Note (Signed)
Patient is experiencing no adverse effects related to rivaroxaban.  Serial stool Hemoccult testing and CBCs will be performed to exclude occult GI blood loss.

## 2011-12-01 NOTE — Patient Instructions (Addendum)
Your physician recommends that you schedule a follow-up appointment in: 4 months  Your physician recommends that you return for lab work in: 2 months  Your physician has recommended you make the following change in your medication:  1 - STOP aspirin   Stool cards x 3 and return to [place] asap

## 2011-12-14 ENCOUNTER — Other Ambulatory Visit: Payer: Self-pay | Admitting: Cardiology

## 2011-12-14 NOTE — Telephone Encounter (Signed)
Please send these to [facility] in [place]. / tg

## 2011-12-15 MED ORDER — METOPROLOL TARTRATE 50 MG PO TABS: 50.0000 mg | ORAL_TABLET | Freq: Two times a day (BID) | ORAL | Status: DC

## 2011-12-15 MED ORDER — FUROSEMIDE 40 MG PO TABS: 40.0000 mg | ORAL_TABLET | Freq: Every day | ORAL | Status: DC

## 2011-12-15 MED ORDER — DIGOXIN 250 MCG PO TABS: 0.2500 mg | ORAL_TABLET | Freq: Every day | ORAL | Status: DC

## 2011-12-15 MED ORDER — POTASSIUM CHLORIDE CRYS ER 20 MEQ PO TBCR: 40.0000 meq | EXTENDED_RELEASE_TABLET | Freq: Every day | ORAL | Status: DC

## 2011-12-30 ENCOUNTER — Encounter (INDEPENDENT_AMBULATORY_CARE_PROVIDER_SITE_OTHER): Payer: Medicare Other

## 2011-12-30 ENCOUNTER — Other Ambulatory Visit (INDEPENDENT_AMBULATORY_CARE_PROVIDER_SITE_OTHER): Payer: Medicare Other | Admitting: *Deleted

## 2011-12-30 DIAGNOSIS — R0989 Other specified symptoms and signs involving the circulatory and respiratory systems: Secondary | ICD-10-CM

## 2011-12-30 DIAGNOSIS — Z7901 Long term (current) use of anticoagulants: Secondary | ICD-10-CM

## 2011-12-30 LAB — POC HEMOCCULT BLD/STL (OFFICE/1-CARD/DIAGNOSTIC)
Card #2 Fecal Occult Blod, POC: NEGATIVE
Card #3 Fecal Occult Blood, POC: NEGATIVE
Fecal Occult Blood, POC: NEGATIVE

## 2012-01-20 ENCOUNTER — Other Ambulatory Visit: Payer: Self-pay | Admitting: Cardiology

## 2012-01-27 ENCOUNTER — Other Ambulatory Visit: Payer: Self-pay | Admitting: *Deleted

## 2012-01-27 DIAGNOSIS — E785 Hyperlipidemia, unspecified: Secondary | ICD-10-CM

## 2012-01-27 DIAGNOSIS — Z7901 Long term (current) use of anticoagulants: Secondary | ICD-10-CM

## 2012-01-31 ENCOUNTER — Other Ambulatory Visit: Payer: Self-pay | Admitting: *Deleted

## 2012-01-31 ENCOUNTER — Other Ambulatory Visit: Payer: Self-pay | Admitting: Cardiology

## 2012-01-31 LAB — BASIC METABOLIC PANEL
Calcium: 9.7 mg/dL (ref 8.4–10.5)
Glucose, Bld: 192 mg/dL — ABNORMAL HIGH (ref 70–99)
Sodium: 136 mEq/L (ref 135–145)

## 2012-01-31 MED ORDER — RIVAROXABAN 20 MG PO TABS: 20.0000 mg | ORAL_TABLET | Freq: Every day | ORAL | Status: DC

## 2012-04-02 ENCOUNTER — Encounter: Payer: Self-pay | Admitting: Cardiology

## 2012-04-02 ENCOUNTER — Ambulatory Visit (INDEPENDENT_AMBULATORY_CARE_PROVIDER_SITE_OTHER): Payer: Medicare Other | Admitting: Cardiology

## 2012-04-02 VITALS — BP 140/70 | HR 101 | Ht 65.0 in | Wt 172.0 lb

## 2012-04-02 DIAGNOSIS — I10 Essential (primary) hypertension: Secondary | ICD-10-CM

## 2012-04-02 DIAGNOSIS — E119 Type 2 diabetes mellitus without complications: Secondary | ICD-10-CM

## 2012-04-02 DIAGNOSIS — E785 Hyperlipidemia, unspecified: Secondary | ICD-10-CM

## 2012-04-02 DIAGNOSIS — I4891 Unspecified atrial fibrillation: Secondary | ICD-10-CM

## 2012-04-02 DIAGNOSIS — I5032 Chronic diastolic (congestive) heart failure: Secondary | ICD-10-CM

## 2012-04-02 DIAGNOSIS — Z7901 Long term (current) use of anticoagulants: Secondary | ICD-10-CM

## 2012-04-02 MED ORDER — METOPROLOL TARTRATE 50 MG PO TABS: 75.0000 mg | ORAL_TABLET | Freq: Two times a day (BID) | ORAL | Status: DC

## 2012-04-02 NOTE — Progress Notes (Signed)
Patient ID: Christine [name], female   DOB: 1940-10-25, 71 y.o.   MRN: [id number]  HPI: Scheduled return visit for this very nice woman with atrial fibrillation and hypertension.  Since her last visit, she has done extremely well, noting weight loss, decreased peripheral edema and resolution of all cardiopulmonary symptoms.  She notes that she feels cold much of the time since the beginning of the fall season.  In recent years, fasting blood glucose has been elevated, typically around 130, but more recently was 192.  She has never been diagnosed with diabetes nor is she treated with pharmacologic agents for same.  Prior to Admission medications   Medication Sig Start Date End Date Taking? Authorizing Provider  albuterol (PROVENTIL HFA;VENTOLIN HFA) 108 (90 BASE) MCG/ACT inhaler Inhale 2 puffs into the lungs every 6 (six) hours as needed. For shortness of breath   Yes Historical Provider, MD  beta carotene w/minerals (OCUVITE) tablet Take 1 tablet by mouth every morning.   Yes Historical Provider, MD  cetirizine (ZYRTEC) 10 MG tablet Take 10 mg by mouth daily.   Yes Historical Provider, MD  cholecalciferol (VITAMIN D) 1000 UNITS tablet Take 1,000 Units by mouth daily.   Yes Historical Provider, MD  citalopram (CELEXA) 20 MG tablet Take 20 mg by mouth daily.   Yes Historical Provider, MD  furosemide (LASIX) 40 MG tablet Take 1 tablet (40 mg total) by mouth daily. 12/14/11 12/13/12 Yes Kathlen Brunswick, MD  K-DUR 20 MEQ tablet TAKE 2 TABLETS BY MOUTH ONCE DAILY. 01/20/12  Yes Kathlen Brunswick, MD  LANOXIN 0.25 MG tablet TAKE 1 TABLET ONCE DAILY. 01/20/12  Yes Kathlen Brunswick, MD  meclizine (ANTIVERT) 12.5 MG tablet Take 12.5 mg by mouth 3 (three) times daily as needed. For dizziness   Yes Historical Provider, MD  metoprolol (LOPRESSOR) 50 MG tablet Take 1 tablet (50 mg total) by mouth 2 (two) times daily. 12/14/11 12/13/12 Yes Kathlen Brunswick, MD  Multiple Vitamin (MULTIVITAMIN WITH MINERALS) TABS Take 1  tablet by mouth every morning.   Yes Historical Provider, MD  pravastatin (PRAVACHOL) 40 MG tablet Take 40 mg by mouth every evening.    Yes Historical Provider, MD  Rivaroxaban (XARELTO) 20 MG TABS Take 1 tablet (20 mg total) by mouth daily. 01/31/12  Yes Kathlen Brunswick, MD   Allergies  Allergen Reactions  . Codeine Anaphylaxis and Hives  . Morphine And Related Anaphylaxis and Hives  . Penicillins Anaphylaxis  . Ace Inhibitors Cough   Past medical history, social history, and family history reviewed and updated.  ROS: Denies dyspnea, orthopnea, PND, lightheadedness or syncope.  No palpitations or subjective tachycardia.  Denies a history of significant GI pathology, hematemesis, melena or hematochezia.  Colonoscopy 5 years ago was negative.  All other systems reviewed and are negative.  PHYSICAL EXAM: BP 140/70  Pulse 101  Ht 5\' 5"  (1.651 m)  Wt 78.019 kg (172 lb)  BMI 28.62 kg/m2  SpO2 95% ; weight decreased 23 pounds since hospital admission in 10/2011 General-Well developed; no acute distress Body habitus-Mildly overweight Neck-No JVD; no carotid bruits Lungs-clear lung fields; resonant to percussion Cardiovascular-normal PMI; normal S1 and S2; irregular rhythm; modest basilar systolic murmur Abdomen-normal bowel sounds; soft and non-tender without masses or organomegaly Musculoskeletal-No deformities, no cyanosis or clubbing Neurologic-Normal cranial nerves; symmetric strength and tone Skin-Warm, no significant lesions Extremities-distal pulses intact; 1+ edema  EKG: Atrial fibrillation with a rapid ventricular response; ventricular rate of 108 bpm; rightward axis; nonspecific  ST-T wave abnormalities; low voltage.  ASSESSMENT AND PLAN:  [place], MD 04/02/2012 3:15 PM

## 2012-04-02 NOTE — Assessment & Plan Note (Signed)
FBS increase to 192 in 01/2012-A1c level to be obtained.

## 2012-04-02 NOTE — Assessment & Plan Note (Signed)
No apparent problems with anticoagulation.  Coagulation studies will be obtained to verify drug effect.

## 2012-04-02 NOTE — Patient Instructions (Addendum)
Your physician recommends that you schedule a follow-up appointment in: 8 months  Diabetic Diet  Your physician recommends that you return for lab work in: 3 months (you will receive a reminder)  Your physician has recommended you make the following change in your medication:  1 - INCREASE Metoprolol to 75 mg BID

## 2012-04-02 NOTE — Assessment & Plan Note (Signed)
Lipid profile is excellent with current therapy, which will be continued.

## 2012-04-02 NOTE — Progress Notes (Deleted)
Name: Christine [name]    DOB: December 10, 1940  Age: 71 y.o.  MR#: [id number]       PCP:  Ernestine Conrad, MD      Insurance: @PAYORNAME @   CC:   No chief complaint on file.  MEDICATION LIST HEME NEGATIVE STOOLS 12/30/11 ECHO 11/14/11  VS BP 140/70  Pulse 101  Ht 5\' 5"  (1.651 m)  Wt 172 lb (78.019 kg)  BMI 28.62 kg/m2  SpO2 95%  Weights Current Weight  04/02/12 172 lb (78.019 kg)  12/01/11 177 lb (80.287 kg)  11/08/11 182 lb 5.1 oz (82.7 kg)    Blood Pressure  BP Readings from Last 3 Encounters:  04/02/12 140/70  12/01/11 120/90  11/08/11 109/78     Admit date:  (Not on file) Last encounter with RMR:  01/31/2012   Allergy Allergies  Allergen Reactions  . Codeine Anaphylaxis and Hives  . Morphine And Related Anaphylaxis and Hives  . Penicillins Anaphylaxis  . Ace Inhibitors Cough    Current Outpatient Prescriptions  Medication Sig Dispense Refill  . albuterol (PROVENTIL HFA;VENTOLIN HFA) 108 (90 BASE) MCG/ACT inhaler Inhale 2 puffs into the lungs every 6 (six) hours as needed. For shortness of breath      . beta carotene w/minerals (OCUVITE) tablet Take 1 tablet by mouth every morning.      . cetirizine (ZYRTEC) 10 MG tablet Take 10 mg by mouth daily.      . cholecalciferol (VITAMIN D) 1000 UNITS tablet Take 1,000 Units by mouth daily.      . citalopram (CELEXA) 20 MG tablet Take 20 mg by mouth daily.      . furosemide (LASIX) 40 MG tablet Take 1 tablet (40 mg total) by mouth daily.  30 tablet  0  . K-DUR 20 MEQ tablet TAKE 2 TABLETS BY MOUTH ONCE DAILY.  60 tablet  5  . LANOXIN 0.25 MG tablet TAKE 1 TABLET ONCE DAILY.  30 tablet  5  . meclizine (ANTIVERT) 12.5 MG tablet Take 12.5 mg by mouth 3 (three) times daily as needed. For dizziness      . metoprolol (LOPRESSOR) 50 MG tablet Take 1 tablet (50 mg total) by mouth 2 (two) times daily.  60 tablet  0  . Multiple Vitamin (MULTIVITAMIN WITH MINERALS) TABS Take 1 tablet by mouth every morning.      . pravastatin (PRAVACHOL) 40  MG tablet Take 40 mg by mouth every evening.       . Rivaroxaban (XARELTO) 20 MG TABS Take 1 tablet (20 mg total) by mouth daily.  30 tablet  11    Discontinued Meds:   There are no discontinued medications.  Patient Active Problem List  Diagnosis  . Hypertension  . Hyperlipidemia  . Dyspnea on exertion  . Diabetes mellitus, type 2  . Atrial fibrillation  . Chronic anticoagulation  . Pulmonary edema    LABS Orders Only on 01/27/2012  Component Date Value  . Sodium 01/31/2012 136   . Potassium 01/31/2012 4.7   . Chloride 01/31/2012 101   . CO2 01/31/2012 26   . Glucose, Bld 01/31/2012 192*  . BUN 01/31/2012 25*  . Creat 01/31/2012 1.05   . Calcium 01/31/2012 9.7      Results for this Opt Visit:     Results for orders placed in visit on 01/27/12  BASIC METABOLIC PANEL      Component Value Range   Sodium 136  135 - 145 mEq/L   Potassium 4.7  3.5 -  5.3 mEq/L   Chloride 101  96 - 112 mEq/L   CO2 26  19 - 32 mEq/L   Glucose, Bld 192 (*) 70 - 99 mg/dL   BUN 25 (*) 6 - 23 mg/dL   Creat 1.61  0.96 - 0.45 mg/dL   Calcium 9.7  8.4 - 40.9 mg/dL    EKG Orders placed in visit on 04/02/12  . EKG 12-LEAD     Prior Assessment and Plan Problem List as of 04/02/2012            Cardiology Problems   Hypertension   Last Assessment & Plan Note   12/01/2011 Office Visit Addendum 12/01/2011  3:27 PM by Kathlen Brunswick, MD    All blood pressure determinations obtained in Epic have been normal.  At this visit, blood pressure systolic decreases 12 mmHg with standing.  This may account for the patient's minor orthostatic dizziness.  Behavioral methods for dealing with her symptoms were reviewed with her.    Hyperlipidemia   Last Assessment & Plan Note   12/01/2011 Office Visit Signed 12/01/2011  2:43 PM by Kathlen Brunswick, MD    Lipid profile 2 months ago indicates excellent control of hyperlipidemia.     Atrial fibrillation   Last Assessment & Plan Note   12/01/2011 Office Visit  Signed 12/01/2011  3:20 PM by Kathlen Brunswick, MD    Patient is asymptomatic with control of heart rate.  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  In the absence of symptoms of congestive heart failure, further testing is not necessary.      Other   Dyspnea on exertion   Last Assessment & Plan Note   09/21/2011 Office Visit Addendum 09/21/2011  3:35 PM by Kathlen Brunswick, MD    Pedal edema is not markedly improved, but patient is more satisfied, and weight has decreased 1 pound.  We will continue furosemide 40 mg per day for now.  Dyspnea on exertion is stable to improved.  A BNP level will be obtained to further assess for possible congestive heart failure.  Patient encouraged to increase her level of activity.    Diabetes mellitus, type 2   Chronic anticoagulation   Last Assessment & Plan Note   12/01/2011 Office Visit Signed 12/01/2011  3:21 PM by Kathlen Brunswick, MD    Patient is experiencing no adverse effects related to rivaroxaban.  Serial stool Hemoccult testing and CBCs will be performed to exclude occult GI blood loss.    Pulmonary edema   Last Assessment & Plan Note   12/01/2011 Office Visit Addendum 12/03/2011 11:19 AM by Kathlen Brunswick, MD    CHF at presentation with BNP level of 2784 apparently mostly related to atrial fibrillation with a rapid ventricular response.  The only specific therapy she is taking now for this problem his diuretic, which will be continued at the current fairly modest dose.  Maintenance of normal electrolytes and renal function will be verified periodically.        Imaging: No results found.   FRS Calculation: Score not calculated. Missing: Total Cholesterol

## 2012-04-02 NOTE — Assessment & Plan Note (Signed)
Symptomatically, patient has improved dramatically.  BNP level will be repeated to assess possible improvement from previous moderately elevated level.  We will continue to monitor electrolytes and renal function.

## 2012-04-02 NOTE — Assessment & Plan Note (Signed)
Control of blood pressure has been excellent over the past 12 months.  Current therapy will be continued.

## 2012-04-03 ENCOUNTER — Encounter: Payer: Self-pay | Admitting: *Deleted

## 2012-04-27 ENCOUNTER — Telehealth: Payer: Self-pay | Admitting: Cardiology

## 2012-04-27 NOTE — Telephone Encounter (Signed)
Message sent by [employer] indicating their opinion that administration of digoxin at a dose of 0.25 mg per day in this patient is excessively risky.  Decrease digoxin to 0.125 mg per day Nursing visit in 2 weeks-vital signs and rhythm strip.

## 2012-04-30 ENCOUNTER — Other Ambulatory Visit: Payer: Self-pay | Admitting: *Deleted

## 2012-04-30 MED ORDER — DIGOXIN 125 MCG PO TABS: 0.1250 mg | ORAL_TABLET | Freq: Every day | ORAL | Status: DC

## 2012-04-30 NOTE — Telephone Encounter (Signed)
Recommendations given to the patient and will come in for a nurse visit on 12/15

## 2012-04-30 NOTE — Telephone Encounter (Signed)
Attempted to contact patient.  Message left for a return call.  Script sent to reflect change.

## 2012-05-16 ENCOUNTER — Ambulatory Visit (INDEPENDENT_AMBULATORY_CARE_PROVIDER_SITE_OTHER): Payer: Medicare Other | Admitting: *Deleted

## 2012-05-16 VITALS — BP 120/84 | HR 88 | Ht 65.0 in | Wt 166.8 lb

## 2012-05-16 DIAGNOSIS — I4891 Unspecified atrial fibrillation: Secondary | ICD-10-CM

## 2012-06-22 ENCOUNTER — Other Ambulatory Visit (HOSPITAL_COMMUNITY): Payer: Self-pay | Admitting: Family Medicine

## 2012-06-22 DIAGNOSIS — Z139 Encounter for screening, unspecified: Secondary | ICD-10-CM

## 2012-06-25 ENCOUNTER — Ambulatory Visit (HOSPITAL_COMMUNITY)
Admission: RE | Admit: 2012-06-25 | Discharge: 2012-06-25 | Disposition: A | Discharge status: Home / Self Care | Payer: Medicare Other | Source: Ambulatory Visit | Attending: Family Medicine | Admitting: Family Medicine

## 2012-06-25 DIAGNOSIS — Z1231 Encounter for screening mammogram for malignant neoplasm of breast: Secondary | ICD-10-CM | POA: Insufficient documentation

## 2012-06-25 DIAGNOSIS — Z139 Encounter for screening, unspecified: Secondary | ICD-10-CM

## 2012-06-27 ENCOUNTER — Other Ambulatory Visit: Payer: Self-pay | Admitting: *Deleted

## 2012-06-27 DIAGNOSIS — I10 Essential (primary) hypertension: Secondary | ICD-10-CM

## 2012-06-27 DIAGNOSIS — Z7901 Long term (current) use of anticoagulants: Secondary | ICD-10-CM

## 2012-06-27 DIAGNOSIS — I4891 Unspecified atrial fibrillation: Secondary | ICD-10-CM

## 2012-06-27 DIAGNOSIS — E119 Type 2 diabetes mellitus without complications: Secondary | ICD-10-CM

## 2012-06-27 DIAGNOSIS — E785 Hyperlipidemia, unspecified: Secondary | ICD-10-CM

## 2012-06-27 DIAGNOSIS — I5032 Chronic diastolic (congestive) heart failure: Secondary | ICD-10-CM

## 2012-07-04 LAB — BASIC METABOLIC PANEL
BUN: 27 mg/dL — ABNORMAL HIGH (ref 6–23)
Potassium: 4.5 mEq/L (ref 3.5–5.3)

## 2012-07-04 LAB — APTT: aPTT: 33 seconds (ref 24–37)

## 2012-07-04 LAB — HEMOGLOBIN A1C: Hgb A1c MFr Bld: 7.5 % — ABNORMAL HIGH (ref <5.7)

## 2012-07-05 ENCOUNTER — Encounter: Payer: Self-pay | Admitting: Cardiology

## 2012-07-05 LAB — BRAIN NATRIURETIC PEPTIDE: Brain Natriuretic Peptide: 210.2 pg/mL — ABNORMAL HIGH (ref 0.0–100.0)

## 2012-07-19 ENCOUNTER — Other Ambulatory Visit: Payer: Self-pay | Admitting: Cardiology

## 2012-11-13 ENCOUNTER — Ambulatory Visit: Payer: Medicare Other | Admitting: Cardiology

## 2012-12-04 ENCOUNTER — Encounter: Payer: Self-pay | Admitting: *Deleted

## 2012-12-04 ENCOUNTER — Ambulatory Visit (INDEPENDENT_AMBULATORY_CARE_PROVIDER_SITE_OTHER): Payer: Medicare Other | Admitting: Adult Health

## 2012-12-04 ENCOUNTER — Encounter: Payer: Self-pay | Admitting: Adult Health

## 2012-12-04 VITALS — BP 126/76 | HR 81 | Ht 65.0 in | Wt 165.0 lb

## 2012-12-04 DIAGNOSIS — I10 Essential (primary) hypertension: Secondary | ICD-10-CM

## 2012-12-04 DIAGNOSIS — I4891 Unspecified atrial fibrillation: Secondary | ICD-10-CM

## 2012-12-04 NOTE — Assessment & Plan Note (Signed)
HR is a little elevated today, she is slightly anxious about appt. Will not make any changes in her rate control medications. She offers no complaints of bleeding on Xarelto. She will be seen again in 6 months unless symptomatic.

## 2012-12-04 NOTE — Progress Notes (Signed)
HPI: Christine [name] is a 73 year old patient of Dr. Dietrich Pates we are following for ongoing assessment and management of atrial fibrillation and hypertension. Patient was last seen by Dr. Dietrich Pates in December 2013. The patient remains on Xarelto 20 mg daily. I last visit she was stable from a cardiovascular standpoint. Heart was mildly elevated at 108 beats per minute. No changes are made in her medical therapy. She is without complaint today. She has not had any ER admissions, surgeries, or hospitalizations. She is medically compliant and watching her diet.   Allergies  Allergen Reactions  . Codeine Anaphylaxis and Hives  . Morphine And Related Anaphylaxis and Hives  . Penicillins Anaphylaxis  . Ace Inhibitors Cough    Current Outpatient Prescriptions  Medication Sig Dispense Refill  . albuterol (PROVENTIL HFA;VENTOLIN HFA) 108 (90 BASE) MCG/ACT inhaler Inhale 2 puffs into the lungs every 6 (six) hours as needed. For shortness of breath      . beta carotene w/minerals (OCUVITE) tablet Take 1 tablet by mouth every morning.      . cetirizine (ZYRTEC) 10 MG tablet Take 10 mg by mouth daily.      . cholecalciferol (VITAMIN D) 1000 UNITS tablet Take 1,000 Units by mouth daily.      . citalopram (CELEXA) 20 MG tablet Take 20 mg by mouth daily.      . digoxin (LANOXIN) 0.125 MG tablet Take 1 tablet (0.125 mg total) by mouth daily.  30 tablet  11  . furosemide (LASIX) 40 MG tablet Take 1 tablet (40 mg total) by mouth daily.  30 tablet  0  . K-DUR 20 MEQ tablet TAKE 2 TABLETS BY MOUTH ONCE DAILY.  60 tablet  5  . meclizine (ANTIVERT) 12.5 MG tablet Take 12.5 mg by mouth 3 (three) times daily as needed. For dizziness      . metoprolol (LOPRESSOR) 50 MG tablet Take 1.5 tablets (75 mg total) by mouth 2 (two) times daily.  135 tablet  3  . Multiple Vitamin (MULTIVITAMIN WITH MINERALS) TABS Take 1 tablet by mouth every morning.      . pravastatin (PRAVACHOL) 40 MG tablet Take 40 mg by mouth every evening.        . Rivaroxaban (XARELTO) 20 MG TABS Take 1 tablet (20 mg total) by mouth daily.  30 tablet  11   No current facility-administered medications for this visit.    Past Medical History  Diagnosis Date  . Seasonal allergies   . Headache(784.0)     twice weekly  . Palpitations   . Hypertension   . Hyperlipidemia   . Vertigo   . Dyspnea on exertion     pedal edema  . Chronic diarrhea     diverticulosis  . Stress incontinence   . Diabetes mellitus, type 2     Diabetic neuropathy  . Bilateral lower extremity edema   . Osteopenia     DEXA scan 01/2010  . Gout   . Atrial fibrillation 08/2011    First diagnosed in 08/2011; duration of arrhythmia is uncertain    Past Surgical History  Procedure Laterality Date  . Cesarean section      x 2  . Appendectomy    . Umbilical hernia repair    . Knee arthroscopy w/ meniscal repair  2007    Bilateral  . Hammer toe surgery      Bilateral hammer toe amputation  . Incisional hernia repair    . Breast biopsy  2002  Right  . Colonoscopy  2007    Negative screening study    ZOX:WRUEAV of systems complete and found to be negative unless listed above  PHYSICAL EXAM BP 126/76  Pulse 81  Ht 5\' 5"  (1.651 m)  Wt 165 lb (74.844 kg)  BMI 27.46 kg/m2 General: Well developed, well nourished, in no acute distress Head: Eyes PERRLA, No xanthomas.   Normal cephalic and atramatic  Lungs: Clear bilaterally to auscultation and percussion. Heart: HRIR S1 S2, without MRG.  Pulses are 2+ & equal.            No carotid bruit. No JVD.  Abdomen: Bowel sounds are positive, abdomen soft and non-tender without masses or  hernia's noted. Msk:  Back normal, normal gait. Normal strength and tone for age. Extremities: No clubbing, cyanosis or edema.  DP +1 Neuro: Alert and oriented X 3. Psych:  Good affect, responds appropriately  WUJ:WJXBJY fibrillation rate of 108 bpm.  ASSESSMENT AND PLAN

## 2012-12-04 NOTE — Progress Notes (Deleted)
Name: Christine [name]     DOB: [birth year]  Age: 72 y.o.  MR#: [id number]       PCP:  Ernestine Conrad, MD      Insurance: Payor: [employer] MEDICARE / Plan: [employer] MEDICARE COMPLETE / Product Type: *No Product type* /   CC:    Chief Complaint  Patient presents with  . Atrial Fibrillation  . Hypertension  . Congestive Heart Failure    Diastolic NMYHA Class 2    VS Filed Vitals:   12/04/12 1148  BP: 126/76  Pulse: 81  Height: 5\' 5"  (1.651 m)  Weight: 165 lb (74.844 kg)    Weights Current Weight  12/04/12 165 lb (74.844 kg)  05/16/12 166 lb 12.8 oz (75.66 kg)  04/02/12 172 lb (78.019 kg)    Blood Pressure  BP Readings from Last 3 Encounters:  12/04/12 126/76  05/16/12 120/84  04/02/12 140/70     Admit date:  (Not on file) Last encounter with RMR:  Visit date not found   Allergy Codeine; Morphine and related; Penicillins; and Ace inhibitors  Current Outpatient Prescriptions  Medication Sig Dispense Refill  . albuterol (PROVENTIL HFA;VENTOLIN HFA) 108 (90 BASE) MCG/ACT inhaler Inhale 2 puffs into the lungs every 6 (six) hours as needed. For shortness of breath      . beta carotene w/minerals (OCUVITE) tablet Take 1 tablet by mouth every morning.      . cetirizine (ZYRTEC) 10 MG tablet Take 10 mg by mouth daily.      . cholecalciferol (VITAMIN D) 1000 UNITS tablet Take 1,000 Units by mouth daily.      . citalopram (CELEXA) 20 MG tablet Take 20 mg by mouth daily.      . digoxin (LANOXIN) 0.125 MG tablet Take 1 tablet (0.125 mg total) by mouth daily.  30 tablet  11  . furosemide (LASIX) 40 MG tablet Take 1 tablet (40 mg total) by mouth daily.  30 tablet  0  . K-DUR 20 MEQ tablet TAKE 2 TABLETS BY MOUTH ONCE DAILY.  60 tablet  5  . meclizine (ANTIVERT) 12.5 MG tablet Take 12.5 mg by mouth 3 (three) times daily as needed. For dizziness      . metoprolol (LOPRESSOR) 50 MG tablet Take 1.5 tablets (75 mg total) by mouth 2 (two) times daily.  135 tablet  3  . Multiple Vitamin  (MULTIVITAMIN WITH MINERALS) TABS Take 1 tablet by mouth every morning.      . pravastatin (PRAVACHOL) 40 MG tablet Take 40 mg by mouth every evening.       . Rivaroxaban (XARELTO) 20 MG TABS Take 1 tablet (20 mg total) by mouth daily.  30 tablet  11   No current facility-administered medications for this visit.    Discontinued Meds:   There are no discontinued medications.  Patient Active Problem List   Diagnosis Date Noted  . Chronic anticoagulation 09/21/2011  . Hypertension   . Hyperlipidemia   . Chronic diastolic congestive heart failure, NYHA class 2   . Diabetes mellitus, type 2   . Atrial fibrillation 08/01/2011    LABS    Component Value Date/Time   NA 141 07/04/2012 1326   NA 136 01/31/2012 1315   NA 137 11/08/2011 0540   K 4.5 07/04/2012 1326   K 4.7 01/31/2012 1315   K 4.2 11/08/2011 0540   CL 106 07/04/2012 1326   CL 101 01/31/2012 1315   CL 96 11/08/2011 0540   CO2 24 07/04/2012 1326  CO2 26 01/31/2012 1315   CO2 34* 11/08/2011 0540   GLUCOSE 173* 07/04/2012 1326   GLUCOSE 192* 01/31/2012 1315   GLUCOSE 134* 11/08/2011 0540   BUN 27* 07/04/2012 1326   BUN 25* 01/31/2012 1315   BUN 23 11/08/2011 0540   CREATININE 0.90 07/04/2012 1326   CREATININE 1.05 01/31/2012 1315   CREATININE 0.75 11/08/2011 0540   CREATININE 0.83 11/07/2011 0542   CREATININE 0.78 11/06/2011 0450   CREATININE 1.02 09/09/2011 1030   CALCIUM 9.9 07/04/2012 1326   CALCIUM 9.7 01/31/2012 1315   CALCIUM 10.2 11/08/2011 0540   GFRNONAA 83* 11/08/2011 0540   GFRNONAA 69* 11/07/2011 0542   GFRNONAA 82* 11/06/2011 0450   GFRAA >90 11/08/2011 0540   GFRAA 80* 11/07/2011 0542   GFRAA >90 11/06/2011 0450   CMP     Component Value Date/Time   NA 141 07/04/2012 1326   K 4.5 07/04/2012 1326   CL 106 07/04/2012 1326   CO2 24 07/04/2012 1326   GLUCOSE 173* 07/04/2012 1326   BUN 27* 07/04/2012 1326   CREATININE 0.90 07/04/2012 1326   CREATININE 0.75 11/08/2011 0540   CALCIUM 9.9 07/04/2012 1326   PROT 7.1 11/04/2011 0204   ALBUMIN 3.4* 11/04/2011 0204   AST  75* 11/04/2011 0204   ALT 79* 11/04/2011 0204   ALKPHOS 67 11/04/2011 0204   BILITOT 0.7 11/04/2011 0204   GFRNONAA 83* 11/08/2011 0540   GFRAA >90 11/08/2011 0540       Component Value Date/Time   WBC 5.6 11/05/2011 0518   WBC 7.3 11/04/2011 0204   WBC 6.7 11/03/2011 2210   HGB 12.0 11/05/2011 0518   HGB 12.6 11/04/2011 0204   HGB 12.3 11/03/2011 2210   HCT 38.3 11/05/2011 0518   HCT 38.9 11/04/2011 0204   HCT 38.2 11/03/2011 2210   MCV 93.6 11/05/2011 0518   MCV 91.7 11/04/2011 0204   MCV 91.6 11/03/2011 2210    Lipid Panel     Component Value Date/Time   CHOL 153 09/21/2011 1521   TRIG 148 09/21/2011 1521   HDL 47 09/21/2011 1521   CHOLHDL 3.3 09/21/2011 1521   VLDL 30 09/21/2011 1521   LDLCALC 76 09/21/2011 1521    ABG No results found for this basename: phart, pco2, pco2art, po2, po2art, hco3, tco2, acidbasedef, o2sat     Lab Results  Component Value Date   TSH 2.936 09/09/2011   BNP (last 3 results) No results found for this basename: PROBNP,  in the last 8760 hours Cardiac Panel (last 3 results) No results found for this basename: CKTOTAL, CKMB, TROPONINI, RELINDX,  in the last 72 hours  Iron/TIBC/Ferritin No results found for this basename: iron, tibc, ferritin     EKG Orders placed in visit on 05/16/12  . EKG 12-LEAD     Prior Assessment and Plan Problem List as of 12/04/2012     Cardiovascular and Mediastinum   Hypertension   Last Assessment & Plan   04/02/2012 Office Visit Written 04/02/2012  4:10 PM by Kathlen Brunswick, MD     Control of blood pressure has been excellent over the past 12 months.  Current therapy will be continued.    Chronic diastolic congestive heart failure, NYHA class 2   Last Assessment & Plan   09/21/2011 Office Visit Edited 09/21/2011  3:35 PM by Kathlen Brunswick, MD     Pedal edema is not markedly improved, but patient is more satisfied, and weight has decreased 1 pound.  We will  continue furosemide 40 mg per day for now.  Dyspnea on exertion is stable to  improved.  A BNP level will be obtained to further assess for possible congestive heart failure.  Patient encouraged to increase her level of activity.    Atrial fibrillation   Last Assessment & Plan   04/02/2012 Office Visit Written 04/02/2012  4:06 PM by Kathlen Brunswick, MD     Symptomatically, patient has improved dramatically.  BNP level will be repeated to assess possible improvement from previous moderately elevated level.  We will continue to monitor electrolytes and renal function.      Endocrine   Diabetes mellitus, type 2   Last Assessment & Plan   04/02/2012 Office Visit Written 04/02/2012  4:07 PM by Kathlen Brunswick, MD     FBS increase to 192 in 01/2012-A1c level to be obtained.      Other   Hyperlipidemia   Last Assessment & Plan   04/02/2012 Office Visit Written 04/02/2012  4:10 PM by Kathlen Brunswick, MD     Lipid profile is excellent with current therapy, which will be continued.    Chronic anticoagulation   Last Assessment & Plan   04/02/2012 Office Visit Written 04/02/2012  4:05 PM by Kathlen Brunswick, MD     No apparent problems with anticoagulation.  Coagulation studies will be obtained to verify drug effect.        Imaging: No results found.

## 2012-12-04 NOTE — Assessment & Plan Note (Signed)
Excellent control of BP at this time. No changes in medications. She will continue low sodium diet.

## 2012-12-04 NOTE — Addendum Note (Signed)
Addended by: Thompson Grayer on: 12/04/2012 03:51 PM   Modules accepted: Orders

## 2012-12-04 NOTE — Patient Instructions (Signed)

## 2012-12-10 ENCOUNTER — Telehealth: Payer: Self-pay

## 2013-02-28 ENCOUNTER — Other Ambulatory Visit: Payer: Self-pay | Admitting: Cardiology

## 2013-05-10 ENCOUNTER — Other Ambulatory Visit: Payer: Self-pay | Admitting: Cardiology

## 2013-05-29 ENCOUNTER — Other Ambulatory Visit: Payer: Self-pay | Admitting: Cardiology

## 2013-06-04 NOTE — Progress Notes (Signed)
HPI: Christine [name] is a 73 year old patient which will be assigned to Dr. Bronson Ing we are following for ongoing assessment and management of atrial fibrillation and hypertension. The patient was last seen in the office in August of 2014. Remained on Xarelto 20 mg daily. She was without complaint with excellent control of blood pressure.   She comes today without complaints. She is adhering to heart healthy diet and walking everyday. She is medically complaint.         Allergies  Allergen Reactions  . Codeine Anaphylaxis and Hives  . Morphine And Related Anaphylaxis and Hives  . Penicillins Anaphylaxis  . Ace Inhibitors Cough    Current Outpatient Prescriptions  Medication Sig Dispense Refill  . albuterol (PROVENTIL HFA;VENTOLIN HFA) 108 (90 BASE) MCG/ACT inhaler Inhale 2 puffs into the lungs every 6 (six) hours as needed. For shortness of breath      . beta carotene w/minerals (OCUVITE) tablet Take 1 tablet by mouth every morning.      . cetirizine (ZYRTEC) 10 MG tablet Take 10 mg by mouth daily.      . cholecalciferol (VITAMIN D) 1000 UNITS tablet Take 1,000 Units by mouth daily.      . citalopram (CELEXA) 20 MG tablet Take 20 mg by mouth daily.      . digoxin (LANOXIN) 0.125 MG tablet TAKE 1 TABLET ONCE DAILY.  30 tablet  3  . meclizine (ANTIVERT) 12.5 MG tablet Take 12.5 mg by mouth 3 (three) times daily as needed. For dizziness      . Multiple Vitamin (MULTIVITAMIN WITH MINERALS) TABS Take 1 tablet by mouth every morning.      . potassium chloride (K-DUR) 10 MEQ tablet TAKE 4 TABLETS ONCE DAILY.  120 tablet  3  . pravastatin (PRAVACHOL) 40 MG tablet Take 40 mg by mouth every evening.       Alveda Reasons 20 MG TABS tablet TAKE (1) TABLET BY MOUTH ONCE DAILY.  30 tablet  6  . furosemide (LASIX) 40 MG tablet Take 1 tablet (40 mg total) by mouth daily.  30 tablet  0  . metoprolol (LOPRESSOR) 50 MG tablet Take 1.5 tablets (75 mg total) by mouth 2 (two) times daily.  135 tablet  3  .  [DISCONTINUED] K-DUR 20 MEQ tablet TAKE 2 TABLETS BY MOUTH ONCE DAILY.  60 tablet  5   No current facility-administered medications for this visit.    Past Medical History  Diagnosis Date  . Seasonal allergies   . Headache(784.0)     twice weekly  . Palpitations   . Hypertension   . Hyperlipidemia   . Vertigo   . Dyspnea on exertion     pedal edema  . Chronic diarrhea     diverticulosis  . Stress incontinence   . Diabetes mellitus, type 2     Diabetic neuropathy  . Bilateral lower extremity edema   . Osteopenia     DEXA scan 01/2010  . Gout   . Atrial fibrillation 08/2011    First diagnosed in 08/2011; duration of arrhythmia is uncertain    Past Surgical History  Procedure Laterality Date  . Cesarean section      x 2  . Appendectomy    . Umbilical hernia repair    . Knee arthroscopy w/ meniscal repair  2007    Bilateral  . Hammer toe surgery      Bilateral hammer toe amputation  . Incisional hernia repair    . Breast biopsy  2002    Right  . Colonoscopy  2007    Negative screening study    QZR:AQTMAU of systems complete and found to be negative unless listed above  PHYSICAL EXAM BP 144/91  Pulse 99  Ht 5\' 5"  (1.651 m)  Wt 172 lb (78.019 kg)  BMI 28.62 kg/m2  General: Well developed, well nourished, in no acute distress Head: Eyes PERRLA, No xanthomas.   Normal cephalic and atramatic  Lungs: Clear bilaterally to auscultation and percussion. Heart: HIRR S1 S2, without MRG.  Pulses are 2+ & equal.            No carotid bruit. No JVD.  No abdominal bruits. No femoral bruits. Abdomen: Bowel sounds are positive, abdomen soft and non-tender without masses or                  Hernia's noted. Msk:  Back normal, normal gait. Normal strength and tone for age. Extremities: No clubbing, cyanosis or edema.  DP +1 Neuro: Alert and oriented X 3. Psych:  Good affect, responds appropriately  EKG:  ASSESSMENT AND PLAN

## 2013-06-05 ENCOUNTER — Ambulatory Visit (INDEPENDENT_AMBULATORY_CARE_PROVIDER_SITE_OTHER): Payer: Medicare Other | Admitting: Adult Health

## 2013-06-05 ENCOUNTER — Encounter (INDEPENDENT_AMBULATORY_CARE_PROVIDER_SITE_OTHER): Payer: Self-pay

## 2013-06-05 ENCOUNTER — Encounter: Payer: Self-pay | Admitting: Adult Health

## 2013-06-05 VITALS — BP 144/91 | HR 99 | Ht 65.0 in | Wt 172.0 lb

## 2013-06-05 DIAGNOSIS — I5032 Chronic diastolic (congestive) heart failure: Secondary | ICD-10-CM

## 2013-06-05 DIAGNOSIS — I10 Essential (primary) hypertension: Secondary | ICD-10-CM

## 2013-06-05 DIAGNOSIS — I4891 Unspecified atrial fibrillation: Secondary | ICD-10-CM

## 2013-06-05 MED ORDER — PRAVASTATIN SODIUM 40 MG PO TABS
40.0000 mg | ORAL_TABLET | Freq: Every evening | ORAL | Status: DC
Start: 2013-06-05 — End: 2015-10-06

## 2013-06-05 MED ORDER — POTASSIUM CHLORIDE ER 10 MEQ PO TBCR: EXTENDED_RELEASE_TABLET | ORAL | Status: DC

## 2013-06-05 MED ORDER — RIVAROXABAN 20 MG PO TABS: ORAL_TABLET | ORAL | Status: DC

## 2013-06-05 MED ORDER — DIGOXIN 125 MCG PO TABS: ORAL_TABLET | ORAL | Status: DC

## 2013-06-05 NOTE — Patient Instructions (Signed)
Your physician recommends that you schedule a follow-up appointment in: 6 months with Dr Koneswaran You will receive a reminder letter two months in advance reminding you to call and schedule your appointment. If you don't receive this letter, please contact our office.  Your physician recommends that you continue on your current medications as directed. Please refer to the Current Medication list given to you today.   

## 2013-06-05 NOTE — Assessment & Plan Note (Signed)
BP is well controlled currently. She is medically complaint. Will continue current medications. Refills are given.

## 2013-06-05 NOTE — Progress Notes (Deleted)
Name: Christine [name]    DOB: 03-11-1941  Age: 73 y.o.  MR#: [id number]       PCP:  Celedonio Savage, MD      Insurance: Payor: [employer] MEDICARE / Plan: AARP MEDICARE COMPLETE / Product Type: *No Product type* /   CC:    Chief Complaint  Patient presents with  . Hypertension  . Atrial Fibrillation    VS Filed Vitals:   06/05/13 1307  BP: 144/91  Pulse: 99  Height: 5\' 5"  (1.651 m)  Weight: 172 lb (78.019 kg)    Weights Current Weight  06/05/13 172 lb (78.019 kg)  12/04/12 165 lb (74.844 kg)  05/16/12 166 lb 12.8 oz (75.66 kg)    Blood Pressure  BP Readings from Last 3 Encounters:  06/05/13 144/91  12/04/12 126/76  05/16/12 120/84     Admit date:  (Not on file) Last encounter with RMR:  Visit date not found   Allergy Codeine; Morphine and related; Penicillins; and Ace inhibitors  Current Outpatient Prescriptions  Medication Sig Dispense Refill  . albuterol (PROVENTIL HFA;VENTOLIN HFA) 108 (90 BASE) MCG/ACT inhaler Inhale 2 puffs into the lungs every 6 (six) hours as needed. For shortness of breath      . beta carotene w/minerals (OCUVITE) tablet Take 1 tablet by mouth every morning.      . cetirizine (ZYRTEC) 10 MG tablet Take 10 mg by mouth daily.      . cholecalciferol (VITAMIN D) 1000 UNITS tablet Take 1,000 Units by mouth daily.      . citalopram (CELEXA) 20 MG tablet Take 20 mg by mouth daily.      . digoxin (LANOXIN) 0.125 MG tablet TAKE 1 TABLET ONCE DAILY.  30 tablet  3  . meclizine (ANTIVERT) 12.5 MG tablet Take 12.5 mg by mouth 3 (three) times daily as needed. For dizziness      . Multiple Vitamin (MULTIVITAMIN WITH MINERALS) TABS Take 1 tablet by mouth every morning.      . potassium chloride (K-DUR) 10 MEQ tablet TAKE 4 TABLETS ONCE DAILY.  120 tablet  3  . pravastatin (PRAVACHOL) 40 MG tablet Take 40 mg by mouth every evening.       Alveda Reasons 20 MG TABS tablet TAKE (1) TABLET BY MOUTH ONCE DAILY.  30 tablet  6  . furosemide (LASIX) 40 MG tablet Take 1  tablet (40 mg total) by mouth daily.  30 tablet  0  . metoprolol (LOPRESSOR) 50 MG tablet Take 1.5 tablets (75 mg total) by mouth 2 (two) times daily.  135 tablet  3  . [DISCONTINUED] K-DUR 20 MEQ tablet TAKE 2 TABLETS BY MOUTH ONCE DAILY.  60 tablet  5   No current facility-administered medications for this visit.    Discontinued Meds:   There are no discontinued medications.  Patient Active Problem List   Diagnosis Date Noted  . Chronic anticoagulation 09/21/2011  . Hypertension   . Hyperlipidemia   . Chronic diastolic congestive heart failure, NYHA class 2   . Diabetes mellitus, type 2   . Atrial fibrillation 08/01/2011    LABS    Component Value Date/Time   NA 141 07/04/2012 1326   NA 136 01/31/2012 1315   NA 137 11/08/2011 0540   K 4.5 07/04/2012 1326   K 4.7 01/31/2012 1315   K 4.2 11/08/2011 0540   CL 106 07/04/2012 1326   CL 101 01/31/2012 1315   CL 96 11/08/2011 0540   CO2 24 07/04/2012  1326   CO2 26 01/31/2012 1315   CO2 34* 11/08/2011 0540   GLUCOSE 173* 07/04/2012 1326   GLUCOSE 192* 01/31/2012 1315   GLUCOSE 134* 11/08/2011 0540   BUN 27* 07/04/2012 1326   BUN 25* 01/31/2012 1315   BUN 23 11/08/2011 0540   CREATININE 0.90 07/04/2012 1326   CREATININE 1.05 01/31/2012 1315   CREATININE 0.75 11/08/2011 0540   CREATININE 0.83 11/07/2011 0542   CREATININE 0.78 11/06/2011 0450   CREATININE 1.02 09/09/2011 1030   CALCIUM 9.9 07/04/2012 1326   CALCIUM 9.7 01/31/2012 1315   CALCIUM 10.2 11/08/2011 0540   GFRNONAA 83* 11/08/2011 0540   GFRNONAA 69* 11/07/2011 0542   GFRNONAA 82* 11/06/2011 0450   GFRAA >90 11/08/2011 0540   GFRAA 80* 11/07/2011 0542   GFRAA >90 11/06/2011 0450   CMP     Component Value Date/Time   NA 141 07/04/2012 1326   K 4.5 07/04/2012 1326   CL 106 07/04/2012 1326   CO2 24 07/04/2012 1326   GLUCOSE 173* 07/04/2012 1326   BUN 27* 07/04/2012 1326   CREATININE 0.90 07/04/2012 1326   CREATININE 0.75 11/08/2011 0540   CALCIUM 9.9 07/04/2012 1326   PROT 7.1 11/04/2011 0204   ALBUMIN 3.4* 11/04/2011 0204    AST 75* 11/04/2011 0204   ALT 79* 11/04/2011 0204   ALKPHOS 67 11/04/2011 0204   BILITOT 0.7 11/04/2011 0204   GFRNONAA 83* 11/08/2011 0540   GFRAA >90 11/08/2011 0540       Component Value Date/Time   WBC 5.6 11/05/2011 0518   WBC 7.3 11/04/2011 0204   WBC 6.7 11/03/2011 2210   HGB 12.0 11/05/2011 0518   HGB 12.6 11/04/2011 0204   HGB 12.3 11/03/2011 2210   HCT 38.3 11/05/2011 0518   HCT 38.9 11/04/2011 0204   HCT 38.2 11/03/2011 2210   MCV 93.6 11/05/2011 0518   MCV 91.7 11/04/2011 0204   MCV 91.6 11/03/2011 2210    Lipid Panel     Component Value Date/Time   CHOL 153 09/21/2011 1521   TRIG 148 09/21/2011 1521   HDL 47 09/21/2011 1521   CHOLHDL 3.3 09/21/2011 1521   VLDL 30 09/21/2011 1521   LDLCALC 76 09/21/2011 1521    ABG No results found for this basename: phart, pco2, pco2art, po2, po2art, hco3, tco2, acidbasedef, o2sat     Lab Results  Component Value Date   TSH 2.936 09/09/2011   BNP (last 3 results) No results found for this basename: PROBNP,  in the last 8760 hours Cardiac Panel (last 3 results) No results found for this basename: CKTOTAL, CKMB, TROPONINI, RELINDX,  in the last 72 hours  Iron/TIBC/Ferritin No results found for this basename: iron, tibc, ferritin     EKG Orders placed in visit on 12/04/12  . EKG 12-LEAD     Prior Assessment and Plan Problem List as of 06/05/2013     Cardiovascular and Mediastinum   Hypertension   Last Assessment & Plan   12/04/2012 Office Visit Written 12/04/2012 12:21 PM by Lendon Colonel, NP     Excellent control of BP at this time. No changes in medications. She will continue low sodium diet.      Chronic diastolic congestive heart failure, NYHA class 2   Last Assessment & Plan   09/21/2011 Office Visit Edited 09/21/2011  3:35 PM by Yehuda Savannah, MD     Pedal edema is not markedly improved, but patient is more satisfied, and weight has decreased 1 pound.  We will continue furosemide 40 mg per day for now.  Dyspnea on exertion is stable to  improved.  A BNP level will be obtained to further assess for possible congestive heart failure.  Patient encouraged to increase her level of activity.    Atrial fibrillation   Last Assessment & Plan   12/04/2012 Office Visit Written 12/04/2012 12:22 PM by Lendon Colonel, NP     HR is a little elevated today, she is slightly anxious about appt. Will not make any changes in her rate control medications. She offers no complaints of bleeding on Xarelto. She will be seen again in 6 months unless symptomatic.      Endocrine   Diabetes mellitus, type 2   Last Assessment & Plan   04/02/2012 Office Visit Written 04/02/2012  4:07 PM by Yehuda Savannah, MD     FBS increase to 192 in 01/2012-A1c level to be obtained.      Other   Hyperlipidemia   Last Assessment & Plan   04/02/2012 Office Visit Written 04/02/2012  4:10 PM by Yehuda Savannah, MD     Lipid profile is excellent with current therapy, which will be continued.    Chronic anticoagulation   Last Assessment & Plan   04/02/2012 Office Visit Written 04/02/2012  4:05 PM by Yehuda Savannah, MD     No apparent problems with anticoagulation.  Coagulation studies will be obtained to verify drug effect.        Imaging: No results found.

## 2013-06-05 NOTE — Assessment & Plan Note (Signed)
No evidence of fluid overload. She is doing very well and is avoiding salt.

## 2013-06-05 NOTE — Assessment & Plan Note (Signed)
Rate is controlled.  Continue Xarelto. Will see her in 6 months with Dr.Koneswaran

## 2013-06-13 ENCOUNTER — Other Ambulatory Visit: Payer: Self-pay | Admitting: Cardiology

## 2013-08-07 ENCOUNTER — Other Ambulatory Visit (HOSPITAL_COMMUNITY): Payer: Self-pay | Admitting: Family Medicine

## 2013-08-07 DIAGNOSIS — Z1231 Encounter for screening mammogram for malignant neoplasm of breast: Secondary | ICD-10-CM

## 2013-08-08 ENCOUNTER — Ambulatory Visit (HOSPITAL_COMMUNITY): Payer: Medicare Other

## 2013-08-13 ENCOUNTER — Ambulatory Visit (HOSPITAL_COMMUNITY)
Admission: RE | Admit: 2013-08-13 | Discharge: 2013-08-13 | Disposition: A | Discharge status: Home / Self Care | Payer: Medicare Other | Source: Ambulatory Visit | Attending: Family Medicine | Admitting: Family Medicine

## 2013-08-13 DIAGNOSIS — Z1231 Encounter for screening mammogram for malignant neoplasm of breast: Secondary | ICD-10-CM | POA: Insufficient documentation

## 2013-08-15 ENCOUNTER — Other Ambulatory Visit: Payer: Self-pay | Admitting: Family Medicine

## 2013-08-15 DIAGNOSIS — R928 Other abnormal and inconclusive findings on diagnostic imaging of breast: Secondary | ICD-10-CM

## 2013-09-04 ENCOUNTER — Encounter (HOSPITAL_COMMUNITY): Payer: Medicare Other

## 2013-10-29 ENCOUNTER — Other Ambulatory Visit: Payer: Self-pay | Admitting: Adult Health

## 2013-10-30 ENCOUNTER — Ambulatory Visit (HOSPITAL_COMMUNITY)
Admission: RE | Admit: 2013-10-30 | Discharge: 2013-10-30 | Disposition: A | Discharge status: Home / Self Care | Payer: Medicare Other | Source: Ambulatory Visit | Attending: Family Medicine | Admitting: Family Medicine

## 2013-10-30 DIAGNOSIS — R928 Other abnormal and inconclusive findings on diagnostic imaging of breast: Secondary | ICD-10-CM | POA: Insufficient documentation

## 2014-02-13 ENCOUNTER — Other Ambulatory Visit: Payer: Self-pay | Admitting: Adult Health

## 2014-04-14 ENCOUNTER — Other Ambulatory Visit: Payer: Self-pay | Admitting: Adult Health

## 2014-06-06 ENCOUNTER — Other Ambulatory Visit: Payer: Self-pay | Admitting: Adult Health

## 2014-09-15 ENCOUNTER — Other Ambulatory Visit: Payer: Self-pay | Admitting: Adult Health

## 2014-09-30 ENCOUNTER — Other Ambulatory Visit: Payer: Self-pay | Admitting: Adult Health

## 2014-12-31 ENCOUNTER — Observation Stay (HOSPITAL_COMMUNITY)
Admission: EM | Admit: 2014-12-31 | Discharge: 2015-01-02 | Disposition: A | Discharge status: Home / Self Care | Payer: Medicare Other | Attending: Family Medicine | Admitting: Family Medicine

## 2014-12-31 ENCOUNTER — Encounter (HOSPITAL_COMMUNITY): Payer: Self-pay | Admitting: Emergency Medicine

## 2014-12-31 ENCOUNTER — Inpatient Hospital Stay (HOSPITAL_BASED_OUTPATIENT_CLINIC_OR_DEPARTMENT_OTHER): Payer: Medicare Other

## 2014-12-31 ENCOUNTER — Emergency Department (HOSPITAL_COMMUNITY): Payer: Medicare Other

## 2014-12-31 DIAGNOSIS — Z7901 Long term (current) use of anticoagulants: Secondary | ICD-10-CM | POA: Insufficient documentation

## 2014-12-31 DIAGNOSIS — R000 Tachycardia, unspecified: Secondary | ICD-10-CM | POA: Diagnosis not present

## 2014-12-31 DIAGNOSIS — M8580 Other specified disorders of bone density and structure, unspecified site: Secondary | ICD-10-CM | POA: Insufficient documentation

## 2014-12-31 DIAGNOSIS — M109 Gout, unspecified: Secondary | ICD-10-CM | POA: Diagnosis not present

## 2014-12-31 DIAGNOSIS — R410 Disorientation, unspecified: Secondary | ICD-10-CM | POA: Diagnosis present

## 2014-12-31 DIAGNOSIS — I10 Essential (primary) hypertension: Secondary | ICD-10-CM | POA: Diagnosis not present

## 2014-12-31 DIAGNOSIS — R0789 Other chest pain: Secondary | ICD-10-CM

## 2014-12-31 DIAGNOSIS — N393 Stress incontinence (female) (male): Secondary | ICD-10-CM | POA: Insufficient documentation

## 2014-12-31 DIAGNOSIS — F0390 Unspecified dementia without behavioral disturbance: Secondary | ICD-10-CM | POA: Diagnosis not present

## 2014-12-31 DIAGNOSIS — Z79899 Other long term (current) drug therapy: Secondary | ICD-10-CM | POA: Insufficient documentation

## 2014-12-31 DIAGNOSIS — G9340 Encephalopathy, unspecified: Secondary | ICD-10-CM

## 2014-12-31 DIAGNOSIS — R739 Hyperglycemia, unspecified: Secondary | ICD-10-CM

## 2014-12-31 DIAGNOSIS — R079 Chest pain, unspecified: Secondary | ICD-10-CM

## 2014-12-31 DIAGNOSIS — E785 Hyperlipidemia, unspecified: Secondary | ICD-10-CM | POA: Diagnosis not present

## 2014-12-31 DIAGNOSIS — I4891 Unspecified atrial fibrillation: Secondary | ICD-10-CM

## 2014-12-31 DIAGNOSIS — I481 Persistent atrial fibrillation: Secondary | ICD-10-CM

## 2014-12-31 DIAGNOSIS — I5032 Chronic diastolic (congestive) heart failure: Secondary | ICD-10-CM | POA: Diagnosis present

## 2014-12-31 DIAGNOSIS — R4182 Altered mental status, unspecified: Secondary | ICD-10-CM | POA: Diagnosis present

## 2014-12-31 DIAGNOSIS — E1151 Type 2 diabetes mellitus with diabetic peripheral angiopathy without gangrene: Secondary | ICD-10-CM

## 2014-12-31 DIAGNOSIS — IMO0002 Reserved for concepts with insufficient information to code with codable children: Secondary | ICD-10-CM

## 2014-12-31 DIAGNOSIS — E1165 Type 2 diabetes mellitus with hyperglycemia: Secondary | ICD-10-CM | POA: Diagnosis not present

## 2014-12-31 DIAGNOSIS — K529 Noninfective gastroenteritis and colitis, unspecified: Secondary | ICD-10-CM | POA: Diagnosis not present

## 2014-12-31 DIAGNOSIS — I482 Chronic atrial fibrillation, unspecified: Secondary | ICD-10-CM

## 2014-12-31 DIAGNOSIS — Z880 Allergy status to penicillin: Secondary | ICD-10-CM | POA: Insufficient documentation

## 2014-12-31 HISTORY — DX: Unspecified dementia, unspecified severity, without behavioral disturbance, psychotic disturbance, mood disturbance, and anxiety: F03.90

## 2014-12-31 LAB — URINALYSIS, ROUTINE W REFLEX MICROSCOPIC
Bilirubin Urine: NEGATIVE
Leukocytes, UA: NEGATIVE
Nitrite: NEGATIVE
Protein, ur: NEGATIVE mg/dL
Specific Gravity, Urine: 1.01 (ref 1.005–1.030)
Urobilinogen, UA: 0.2 mg/dL (ref 0.0–1.0)
pH: 5 (ref 5.0–8.0)

## 2014-12-31 LAB — COMPREHENSIVE METABOLIC PANEL
ALK PHOS: 89 U/L (ref 38–126)
ALT: 16 U/L (ref 14–54)
AST: 20 U/L (ref 15–41)
Albumin: 3.6 g/dL (ref 3.5–5.0)
Anion gap: 11 (ref 5–15)
BILIRUBIN TOTAL: 1 mg/dL (ref 0.3–1.2)
BUN: 13 mg/dL (ref 6–20)
CO2: 22 mmol/L (ref 22–32)
CREATININE: 1.03 mg/dL — AB (ref 0.44–1.00)
Calcium: 8.8 mg/dL — ABNORMAL LOW (ref 8.9–10.3)
Chloride: 99 mmol/L — ABNORMAL LOW (ref 101–111)
GFR, EST NON AFRICAN AMERICAN: 52 mL/min — AB (ref >60)
Glucose, Bld: 459 mg/dL — ABNORMAL HIGH (ref 65–99)
Potassium: 3.8 mmol/L (ref 3.5–5.1)
Sodium: 132 mmol/L — ABNORMAL LOW (ref 135–145)
Total Protein: 7.6 g/dL (ref 6.5–8.1)

## 2014-12-31 LAB — CBC WITH DIFFERENTIAL/PLATELET
Basophils Absolute: 0 10*3/uL (ref 0.0–0.1)
Basophils Relative: 0 % (ref 0–1)
Eosinophils Absolute: 0 10*3/uL (ref 0.0–0.7)
Eosinophils Relative: 0 % (ref 0–5)
HCT: 41.9 % (ref 36.0–46.0)
HEMOGLOBIN: 14.3 g/dL (ref 12.0–15.0)
LYMPHS ABS: 1.4 10*3/uL (ref 0.7–4.0)
LYMPHS PCT: 12 % (ref 12–46)
MCH: 30.2 pg (ref 26.0–34.0)
MCHC: 34.1 g/dL (ref 30.0–36.0)
MCV: 88.6 fL (ref 78.0–100.0)
MONO ABS: 0.8 10*3/uL (ref 0.1–1.0)
MONOS PCT: 6 % (ref 3–12)
NEUTROS ABS: 9.9 10*3/uL — AB (ref 1.7–7.7)
Neutrophils Relative %: 82 % — ABNORMAL HIGH (ref 43–77)
Platelets: 144 10*3/uL — ABNORMAL LOW (ref 150–400)
RBC: 4.73 MIL/uL (ref 3.87–5.11)
RDW: 12.9 % (ref 11.5–15.5)
WBC: 12.1 10*3/uL — ABNORMAL HIGH (ref 4.0–10.5)

## 2014-12-31 LAB — GLUCOSE, CAPILLARY
GLUCOSE-CAPILLARY: 240 mg/dL — AB (ref 65–99)
Glucose-Capillary: 292 mg/dL — ABNORMAL HIGH (ref 65–99)
Glucose-Capillary: 157 mg/dL — ABNORMAL HIGH (ref 65–99)
Glucose-Capillary: 162 mg/dL — ABNORMAL HIGH (ref 65–99)
Glucose-Capillary: 301 mg/dL — ABNORMAL HIGH (ref 65–99)
Glucose-Capillary: 287 mg/dL — ABNORMAL HIGH (ref 65–99)

## 2014-12-31 LAB — CBG MONITORING, ED
GLUCOSE-CAPILLARY: 398 mg/dL — AB (ref 65–99)
Glucose-Capillary: 324 mg/dL — ABNORMAL HIGH (ref 65–99)

## 2014-12-31 LAB — URINE MICROSCOPIC-ADD ON

## 2014-12-31 LAB — TROPONIN I
TROPONIN I: 0.05 ng/mL — AB (ref <0.031)
TROPONIN I: 0.04 ng/mL — AB (ref <0.031)
TROPONIN I: 0.03 ng/mL (ref <0.031)
Troponin I: 0.05 ng/mL — ABNORMAL HIGH (ref <0.031)

## 2014-12-31 LAB — DIGOXIN LEVEL: Digoxin Level: 2 ng/mL (ref 0.8–2.0)

## 2014-12-31 LAB — MAGNESIUM: Magnesium: 1.5 mg/dL — ABNORMAL LOW (ref 1.7–2.4)

## 2014-12-31 LAB — MRSA PCR SCREENING: MRSA BY PCR: NEGATIVE

## 2014-12-31 MED ORDER — DILTIAZEM HCL 100 MG IV SOLR
5.0000 mg/h | INTRAVENOUS | Status: DC
  Administered 2014-12-31: 10 mg/h via INTRAVENOUS
  Filled 2014-12-31: qty 100

## 2014-12-31 MED ORDER — INSULIN ASPART 100 UNIT/ML ~~LOC~~ SOLN
0.0000 [IU] | Freq: Every day | SUBCUTANEOUS | Status: DC
  Administered 2014-12-31: 3 [IU] via SUBCUTANEOUS

## 2014-12-31 MED ORDER — ACETAMINOPHEN 325 MG PO TABS
650.0000 mg | ORAL_TABLET | Freq: Four times a day (QID) | ORAL | Status: DC | PRN
Start: 2014-12-31 — End: 2015-01-02
  Administered 2015-01-01: 650 mg via ORAL
  Filled 2014-12-31: qty 2

## 2014-12-31 MED ORDER — ONDANSETRON HCL 4 MG PO TABS: 4.0000 mg | ORAL_TABLET | Freq: Four times a day (QID) | ORAL | Status: DC | PRN

## 2014-12-31 MED ORDER — ACETAMINOPHEN 650 MG RE SUPP: 650.0000 mg | Freq: Four times a day (QID) | RECTAL | Status: DC | PRN

## 2014-12-31 MED ORDER — PRAVASTATIN SODIUM 40 MG PO TABS
40.0000 mg | ORAL_TABLET | Freq: Every evening | ORAL | Status: DC
  Administered 2014-12-31 – 2015-01-01 (×2): 40 mg via ORAL
  Filled 2014-12-31 (×2): qty 1

## 2014-12-31 MED ORDER — DILTIAZEM HCL 100 MG IV SOLR
5.0000 mg/h | Freq: Once | INTRAVENOUS | Status: AC
  Administered 2014-12-31: 5 mg/h via INTRAVENOUS
  Filled 2014-12-31: qty 100

## 2014-12-31 MED ORDER — RIVAROXABAN 20 MG PO TABS
20.0000 mg | ORAL_TABLET | Freq: Every day | ORAL | Status: DC
  Administered 2014-12-31 – 2015-01-02 (×3): 20 mg via ORAL
  Filled 2014-12-31 (×3): qty 1

## 2014-12-31 MED ORDER — LEVOFLOXACIN IN D5W 500 MG/100ML IV SOLN
500.0000 mg | INTRAVENOUS | Status: DC
  Administered 2014-12-31: 500 mg via INTRAVENOUS
  Filled 2014-12-31: qty 100

## 2014-12-31 MED ORDER — MECLIZINE HCL 12.5 MG PO TABS: 12.5000 mg | ORAL_TABLET | Freq: Three times a day (TID) | ORAL | Status: DC | PRN

## 2014-12-31 MED ORDER — INSULIN REGULAR HUMAN 100 UNIT/ML IJ SOLN
INTRAMUSCULAR | Status: DC
  Administered 2014-12-31: 7 [IU]/h via INTRAVENOUS
  Administered 2014-12-31: 3.4 [IU]/h via INTRAVENOUS
  Filled 2014-12-31: qty 2.5

## 2014-12-31 MED ORDER — INSULIN ASPART 100 UNIT/ML ~~LOC~~ SOLN
0.0000 [IU] | Freq: Three times a day (TID) | SUBCUTANEOUS | Status: DC
  Administered 2014-12-31: 7 [IU] via SUBCUTANEOUS
  Administered 2014-12-31: 2 [IU] via SUBCUTANEOUS
  Administered 2015-01-01: 5 [IU] via SUBCUTANEOUS

## 2014-12-31 MED ORDER — SODIUM CHLORIDE 0.9 % IV SOLN
INTRAVENOUS | Status: DC
  Administered 2014-12-31: 02:00:00 via INTRAVENOUS

## 2014-12-31 MED ORDER — METOPROLOL TARTRATE 50 MG PO TABS
75.0000 mg | ORAL_TABLET | Freq: Two times a day (BID) | ORAL | Status: DC
  Administered 2014-12-31 – 2015-01-01 (×3): 75 mg via ORAL
  Filled 2014-12-31 (×6): qty 1

## 2014-12-31 MED ORDER — CITALOPRAM HYDROBROMIDE 20 MG PO TABS
20.0000 mg | ORAL_TABLET | Freq: Every day | ORAL | Status: DC
  Administered 2014-12-31 – 2015-01-02 (×3): 20 mg via ORAL
  Filled 2014-12-31 (×3): qty 1

## 2014-12-31 MED ORDER — DILTIAZEM HCL 25 MG/5ML IV SOLN
5.0000 mg | Freq: Once | INTRAVENOUS | Status: AC
  Administered 2014-12-31: 5 mg via INTRAVENOUS
  Filled 2014-12-31: qty 5

## 2014-12-31 MED ORDER — ONDANSETRON HCL 4 MG/2ML IJ SOLN: 4.0000 mg | Freq: Four times a day (QID) | INTRAMUSCULAR | Status: DC | PRN

## 2014-12-31 MED ORDER — MAGNESIUM OXIDE 400 (241.3 MG) MG PO TABS
400.0000 mg | ORAL_TABLET | Freq: Two times a day (BID) | ORAL | Status: DC
  Administered 2014-12-31 – 2015-01-01 (×3): 400 mg via ORAL
  Filled 2014-12-31 (×4): qty 1

## 2014-12-31 MED ORDER — SULFAMETHOXAZOLE-TRIMETHOPRIM 800-160 MG PO TABS
1.0000 | ORAL_TABLET | Freq: Once | ORAL | Status: AC
  Administered 2014-12-31: 1 via ORAL
  Filled 2014-12-31: qty 1

## 2014-12-31 NOTE — ED Notes (Signed)
Patient assisted to restroom, tolerated well 

## 2014-12-31 NOTE — Evaluation (Addendum)
Physical Therapy Evaluation Patient Details Name: Christine [name] MRN: [id number] DOB: 06-May-1940 Today's Date: 12/31/2014   History of Present Illness  74 year old female who  has a past medical history of Seasonal allergies; Headache(784.0); Palpitations; Hypertension; Hyperlipidemia; Vertigo; Dyspnea on exertion; Chronic diarrhea; Stress incontinence; Diabetes mellitus, type 2; Bilateral lower extremity edema; Osteopenia; Gout; and Atrial fibrillation (08/2011).Today was brought to the hospital by EMS after patient was found to be in altered mental status. Heparin drip patient had called 911 and when the EMS arrived at her house she was found to be confused. Patient is unable to good history. Though she says that she denies chest pain, no shortness of breath. She felt that her heart was racing. EMS was called for possible fall. CBG was found to be elevated at 426. In ED pt found to be in afib c RVR.   Clinical Impression  Pt is received seated in bed upon entry, with daughter at bedside. Pt is awake, alert, and willing to participate, disoriented to time, but a relatively good historian. No acute distress noted, as all c/o chest/arm pain have since resolved. Pt reports zero falls in the last 6 months, with exception to the most recent. Pt strength as screened by assessment of fucntional mobility but is generally weak in upper and lower quarters, requiring min--max assist for transfers and bed mobility. Pt falls risk is high as evidenced by slow gait speed, poor forward reach, and altered posturing during transfers and mobility. Pt is now on room air, with O2 sats remaining at 95%. Other vitals monitored, noted to be unremarkable during changes in position and exertion. Pt is very weak during ambulation trial, which daughter reports to be quite impaired when compared to baseline, unable to ambulate safe distances appropriate for DC to home. Patient presenting with impairment of strength, range of motion,  balance, and activity tolerance, limiting ability to perform ADL and mobility tasks at  baseline level of function. Patient will benefit from skilled intervention to address the above impairments and limitations, in order to restore to prior level of function, improve patient safety upon discharge, and to decrease falls risk.       Follow Up Recommendations SNF    Equipment Recommendations  None recommended by PT    Recommendations for Other Services       Precautions / Restrictions Restrictions Weight Bearing Restrictions: No      Mobility  Bed Mobility Overal bed mobility: +2 for physical assistance;Needs Assistance Bed Mobility: Supine to Sit;Sit to Supine     Supine to sit: Mod assist Sit to supine: Max assist   General bed mobility comments: Can initiate sitting up, but poor ability to scoot or slide up toward EOB.   Transfers Overall transfer level: Needs assistance Equipment used: 1 person hand held assist Transfers: Sit to/from Stand Sit to Stand: Min guard            Ambulation/Gait Ambulation/Gait assistance: Min guard Ambulation Distance (Feet): 12 Feet (stopped 3 times to rest due to fatigue. ) Assistive device: 1 person hand held assist Gait Pattern/deviations: Wide base of support;Decreased step length - left;Decreased step length - right;Staggering left;Decreased weight shift to right;Decreased weight shift to left;Shuffle   Gait velocity interpretation: <1.8 ft/sec, indicative of risk for recurrent falls General Gait Details: very labored, appearing weak and unsteady.  Stairs            Wheelchair Mobility    Modified Rankin (Stroke Patients Only)  Balance Overall balance assessment: Needs assistance Sitting-balance support: No upper extremity supported;Feet supported Sitting balance-Leahy Scale: Fair Sitting balance - Comments: limited endurance to upright posture.  Postural control: Posterior lean;Right lateral lean Standing  balance support: Single extremity supported Standing balance-Leahy Scale: Fair                               Pertinent Vitals/Pain Pain Assessment: No/denies pain (c/o chest and arm pain have fully resolved. )    Home Living Family/patient expects to be discharged to:: Private residence Living Arrangements: Alone Available Help at Discharge: Family;Available PRN/intermittently Type of Home: House Home Access: Stairs to enter Entrance Stairs-Rails: Can reach both;Left;Right Entrance Stairs-Number of Steps: 3 Home Layout: One level Home Equipment: Walker - 2 wheels Additional Comments: RW does not fit into half bath, but does fit into full bath in pts bedroom.    Prior Function Level of Independence: Independent with assistive device(s);Needs assistance   Gait / Transfers Assistance Needed: limited to household distance ambulation, uses a RW intermittently based on increased feelings of instability; can perform limited community distance ambulation c MinGuard, but very slow and labored.   ADL's / Homemaking Assistance Needed: Daughter assists with meals, groceries, errands, and other IADL.   Comments: Stopped working about one year ago.      Hand Dominance        Extremity/Trunk Assessment   Upper Extremity Assessment: Generalized weakness           Lower Extremity Assessment: Generalized weakness      Cervical / Trunk Assessment:  (weakness and poor endurance. )  Communication   Communication: HOH  Cognition Arousal/Alertness: Awake/alert Behavior During Therapy: WFL for tasks assessed/performed Overall Cognitive Status: History of cognitive impairments - at baseline (Oriented to person and place only; unable to recall year, month, or season. )       Memory: Decreased short-term memory              General Comments      Exercises        Assessment/Plan    PT Assessment Patient needs continued PT services  PT Diagnosis Difficulty  walking;Abnormality of gait;Generalized weakness   PT Problem List Decreased strength;Decreased activity tolerance;Decreased balance;Decreased mobility;Cardiopulmonary status limiting activity  PT Treatment Interventions DME instruction;Gait training;Stair training;Functional mobility training;Therapeutic activities;Therapeutic exercise;Balance training   PT Goals (Current goals can be found in the Care Plan section) Acute Rehab PT Goals Patient Stated Goal: Return to baseline mobility PT Goal Formulation: With patient/family Time For Goal Achievement: 01/14/15 Potential to Achieve Goals: Good    Frequency Min 3X/week   Barriers to discharge Decreased caregiver support lives alone c limited family support     Co-evaluation               End of Session Equipment Utilized During Treatment: Gait belt Activity Tolerance: Patient tolerated treatment well;Patient limited by fatigue Patient left: in bed;with bed alarm set;with family/visitor present Nurse Communication: Other (comment)         Time: 3151-7616 PT Time Calculation (min) (ACUTE ONLY): 14 min   Charges:   PT Evaluation $Initial PT Evaluation Tier I: 1 Procedure     PT G Codes:   Functional Assessment Tool Used Clinical Judgment    Functional Limitation Mobility: Walking and moving around   Mobility: Walking and Moving Around Current Status (W7371) At least 40 percent but less than 60 percent impaired, limited or restricted  Mobility: Walking and Moving Around Goal Status [phone]) At least 40 percent but less than 60 percent impaired, limited or restricted               Caralee Morea C 12/31/2014, 11:57 AM 12:02 PM  Etta Grandchild, PT, DPT [place] License # [id number]  4:03 PM  Etta Grandchild, PT, DPT [place] License # [id number]

## 2014-12-31 NOTE — Care Management Note (Signed)
Case Management Note  Patient Details  Name: TOBI GROESBECK MRN: [id number] Date of Birth: [birth year]  Expected Discharge Date:     01/01/2015             Expected Discharge Plan:  Home/Self Care  In-House Referral:  NA  Discharge planning Services  CM Consult  Post Acute Care Choice:  NA Choice offered to:  NA  DME Arranged:    DME Agency:     HH Arranged:    HH Agency:     Status of Service:  In process, will continue to follow  Medicare Important Message Given:    Date Medicare IM Given:    Medicare IM give by:    Date Additional Medicare IM Given:    Additional Medicare Important Message give by:     If discussed at [place] of Stay Meetings, dates discussed:    Additional Comments: Pt is from home, lives alone and is independent with her ADL's. Pt admitted for AMS after not taking medications properly. Pt has a daughter who comes by every other day to assist with medication preporation. Despite her daughter assistance she has continued to not take medications properly. CM discussed discharge plan options with daughter. Pt's daughter will change her schedule so that she can go daily to better monitor medication compliance. CM will provide daughter with list of PD agencies, daughter may contact to visit with pt daily also. Pt's daughter not interested in [facility] RN or ALF placement. Daughter instructed to contact pt's PCP if pt continues to fail living by herself. Will cont to follow for DC planning.    Sherald Barge, RN 12/31/2014, 11:31 AM

## 2014-12-31 NOTE — ED Notes (Signed)
Pt called ems due to fall. Pt denies any pain however ems personnel states she is confused and her blood sugar 426.

## 2014-12-31 NOTE — Progress Notes (Signed)
Inpatient Diabetes Program Recommendations  AACE/ADA: New Consensus Statement on Inpatient Glycemic Control (2013)  Target Ranges:  Prepandial:   less than 140 mg/dL      Peak postprandial:   less than 180 mg/dL (1-2 hours)      Critically ill patients:  140 - 180 mg/dL   Reason for Admission: AMS  Diabetes history: DM 2 Outpatient Diabetes medications: None listed on med rec, however, pharmacy to verify today, RN reports pt takes insulin at home  Current orders for Inpatient glycemic control: insulin gtt  Inpatient Diabetes Program Recommendations Insulin - Basal: Based on patients weight, please consider starting basal iinsulin at Lantus 8-10 units Q24hrs (.01units/kg is 8 units). Correction (SSI): Please consider starting Novolog Sensitive scale TID and HS coverage.  Thanks,  Tama Headings RN, MSN, [facility] Inpatient Diabetes Coordinator Team Pager [phone]

## 2014-12-31 NOTE — Plan of Care (Signed)
Problem: Acute Rehab PT Goals(only PT should resolve) Goal: Pt Will Go Supine/Side To Sit Pt will demonstrate ModI bed mobility supine to sitting edge-of-bed to return to PLOF,improve safety at home, and to decrease caregiver burden.     Goal: Pt Will Ambulate Pt will ambulate with RW at Supervision using a step-through pattern and equal step length for a distances greater than 147ft to demonstrate the ability to perform safe household distance ambulation at discharge.    Goal: Pt Will Go Up/Down Stairs Pt will ascend/descend 3 stairs with LRAD and 1 HR at Supervision to demonstrate safe entry/exit of home.

## 2014-12-31 NOTE — Consult Note (Signed)
Reason for Consult:Afib with RVR Referring Physician: PTH  Christine [name] is an 74 y.o. female.  HPI: This is a 74 yo female patient admitted with acute encephalopathy with possible fall and confusion with CBG 456. She was found to be in Atrial fib with RVR started on Cardizem.   She is a prior patient of Dr. Lattie Haw and supposed to see Dr. Bronson Ing. She was last seen in our office 06/2013. She has history of chronic atrial fibrillation on Xarelto, combined systolic and diastolic CHF, HTN, DM, hyperlipidemia. Echo 2013 EF 40-45%, severe LAE, aortic sclerosis with no stenosis, moderate MR, mild pulmonary hypertension.  Patient is interviewed with her daughter in the room. Patient admits to missing a lot of her medications. Says she just forgets to take them. She lives alone but daughter sets the meds out and finds them not taken. Patient exhibits some confusion-can't remember certain things including grandchildrens ages. She says she had chest tightness into her left shoulder yesterday prompting admission. Her heart was also racing, but she's never had chest pain before. 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 She denies exertional chest pain. Has never smoked and has no family history of CAD.CT head generalized atrophy and small vessel disease. Troponins 0.05 x 2, EKG afib with RVR, no acute change.  Past Medical History  Diagnosis Date  . Seasonal allergies   . Headache(784.0)     twice weekly  . Palpitations   . Hypertension   . Hyperlipidemia   . Vertigo   . Dyspnea on exertion     pedal edema  . Chronic diarrhea     diverticulosis  . Stress incontinence   . Diabetes mellitus, type 2     Diabetic neuropathy  . Bilateral lower extremity edema   . Osteopenia     DEXA scan 01/2010  . Gout   . Atrial fibrillation 08/2011    First diagnosed in 08/2011; duration of arrhythmia is  uncertain    Past Surgical History  Procedure Laterality Date  . Cesarean section      x 2  . Appendectomy    . Umbilical hernia repair    . Knee arthroscopy w/ meniscal repair  2007    Bilateral  . Hammer toe surgery      Bilateral hammer toe amputation  . Incisional hernia repair    . Breast biopsy  2002    Right  . Colonoscopy  2007    Negative screening study    Family History  Problem Relation Age of Onset  . Adopted: Yes    Social History:  reports that she has never smoked. She has never used smokeless tobacco. She reports that she does not drink alcohol or use illicit drugs.  Allergies:  Allergies  Allergen Reactions  . Codeine Anaphylaxis and Hives  . Morphine And Related Anaphylaxis and Hives  . Penicillins Anaphylaxis  . Ace Inhibitors Cough    Medications:  Scheduled Meds: . citalopram  20 mg Oral Daily  . levofloxacin (LEVAQUIN) IV  500 mg Intravenous Q24H  . metoprolol  75 mg Oral BID  . pravastatin  40 mg Oral QPM  . rivaroxaban  20 mg Oral Daily   Continuous Infusions: . sodium chloride 100 mL/hr at 12/31/14 0211  . diltiazem (CARDIZEM) infusion 10 mg/hr (12/31/14 0820)  . insulin (NOVOLIN-R) infusion 7.2 mL/hr at 12/31/14 [phone]  PRN Meds:.acetaminophen **OR** acetaminophen, meclizine, ondansetron **OR** ondansetron (ZOFRAN) IV   Results for orders placed or performed during the hospital encounter of 12/31/14 (from the past 48 hour(s))  Comprehensive metabolic panel     Status: Abnormal   Collection Time: 12/31/14  2:02 AM  Result Value Ref Range   Sodium 132 (L) 135 - 145 mmol/L   Potassium 3.8 3.5 - 5.1 mmol/L   Chloride 99 (L) 101 - 111 mmol/L   CO2 22 22 - 32 mmol/L   Glucose, Bld 459 (H) 65 - 99 mg/dL   BUN 13 6 - 20 mg/dL   Creatinine, Ser 1.03 (H) 0.44 - 1.00 mg/dL   Calcium 8.8 (L) 8.9 - 10.3 mg/dL   Total Protein 7.6 6.5 - 8.1 g/dL   Albumin 3.6 3.5 - 5.0 g/dL   AST 20 15 - 41 U/L   ALT 16 14 - 54 U/L   Alkaline Phosphatase  89 38 - 126 U/L   Total Bilirubin 1.0 0.3 - 1.2 mg/dL   GFR calc non Af Amer 52 (L) >60 mL/min   GFR calc Af Amer >60 >60 mL/min    Comment: (NOTE) The eGFR has been calculated using the CKD EPI equation. This calculation has not been validated in all clinical situations. eGFR's persistently <60 mL/min signify possible Chronic Kidney Disease.    Anion gap 11 5 - 15  Troponin I     Status: Abnormal   Collection Time: 12/31/14  2:02 AM  Result Value Ref Range   Troponin I 0.05 (H) <0.031 ng/mL    Comment:        PERSISTENTLY INCREASED TROPONIN VALUES IN THE RANGE OF 0.04-0.49 ng/mL CAN BE SEEN IN:       -UNSTABLE ANGINA       -CONGESTIVE HEART FAILURE       -MYOCARDITIS       -CHEST TRAUMA       -ARRYHTHMIAS       -LATE PRESENTING MYOCARDIAL INFARCTION       -COPD   CLINICAL FOLLOW-UP RECOMMENDED.   CBC with Differential     Status: Abnormal   Collection Time: 12/31/14  2:02 AM  Result Value Ref Range   WBC 12.1 (H) 4.0 - 10.5 K/uL   RBC 4.73 3.87 - 5.11 MIL/uL   Hemoglobin 14.3 12.0 - 15.0 g/dL   HCT 41.9 36.0 - 46.0 %   MCV 88.6 78.0 - 100.0 fL   MCH 30.2 26.0 - 34.0 pg   MCHC 34.1 30.0 - 36.0 g/dL   RDW 12.9 11.5 - 15.5 %   Platelets 144 (L) 150 - 400 K/uL   Neutrophils Relative % 82 (H) 43 - 77 %   Neutro Abs 9.9 (H) 1.7 - 7.7 K/uL   Lymphocytes Relative 12 12 - 46 %   Lymphs Abs 1.4 0.7 - 4.0 K/uL   Monocytes Relative 6 3 - 12 %   Monocytes Absolute 0.8 0.1 - 1.0 K/uL   Eosinophils Relative 0 0 - 5 %   Eosinophils Absolute 0.0 0.0 - 0.7 K/uL   Basophils Relative 0 0 - 1 %   Basophils Absolute 0.0 0.0 - 0.1 K/uL  Digoxin level     Status: None   Collection Time: 12/31/14  2:02 AM  Result Value Ref Range   Digoxin Level 2.0 0.8 - 2.0 ng/mL    Comment: RESULTS CONFIRMED BY MANUAL DILUTION  Magnesium     Status: Abnormal   Collection Time: 12/31/14  2:07  AM  Result Value Ref Range   Magnesium 1.5 (L) 1.7 - 2.4 mg/dL  Urinalysis, Routine w reflex microscopic  (not at [facility])     Status: Abnormal   Collection Time: 12/31/14  3:03 AM  Result Value Ref Range   Color, Urine YELLOW YELLOW   APPearance CLEAR CLEAR   Specific Gravity, Urine 1.010 1.005 - 1.030   pH 5.0 5.0 - 8.0   Glucose, UA >1000 (A) NEGATIVE mg/dL   Hgb urine dipstick TRACE (A) NEGATIVE   Bilirubin Urine NEGATIVE NEGATIVE   Ketones, ur TRACE (A) NEGATIVE mg/dL   Protein, ur NEGATIVE NEGATIVE mg/dL   Urobilinogen, UA 0.2 0.0 - 1.0 mg/dL   Nitrite NEGATIVE NEGATIVE   Leukocytes, UA NEGATIVE NEGATIVE  Urine microscopic-add on     Status: Abnormal   Collection Time: 12/31/14  3:03 AM  Result Value Ref Range   Squamous Epithelial / LPF RARE RARE   Bacteria, UA MANY (A) RARE   Casts GRANULAR CAST (A) NEGATIVE  CBG monitoring, ED     Status: Abnormal   Collection Time: 12/31/14  4:48 AM  Result Value Ref Range   Glucose-Capillary 398 (H) 65 - 99 mg/dL   Comment 1 Notify RN    Comment 2 Document in Chart   CBG monitoring, ED     Status: Abnormal   Collection Time: 12/31/14  5:51 AM  Result Value Ref Range   Glucose-Capillary 324 (H) 65 - 99 mg/dL  Troponin I     Status: Abnormal   Collection Time: 12/31/14  6:56 AM  Result Value Ref Range   Troponin I 0.05 (H) <0.031 ng/mL    Comment:        PERSISTENTLY INCREASED TROPONIN VALUES IN THE RANGE OF 0.04-0.49 ng/mL CAN BE SEEN IN:       -UNSTABLE ANGINA       -CONGESTIVE HEART FAILURE       -MYOCARDITIS       -CHEST TRAUMA       -ARRYHTHMIAS       -LATE PRESENTING MYOCARDIAL INFARCTION       -COPD   CLINICAL FOLLOW-UP RECOMMENDED.   Glucose, capillary     Status: Abnormal   Collection Time: 12/31/14  7:40 AM  Result Value Ref Range   Glucose-Capillary 240 (H) 65 - 99 mg/dL   Comment 1 Notify RN    Comment 2 Document in Chart     Ct Head Wo Contrast  12/31/2014   CLINICAL DATA:  [place].  Hyperglycemia.  EXAM: CT HEAD WITHOUT CONTRAST  TECHNIQUE: Contiguous axial images were obtained from the base of the skull through  the vertex without intravenous contrast.  COMPARISON:  None.  FINDINGS: There is no intracranial hemorrhage or extra-axial fluid collection. There is moderate generalized atrophy. There is mild white matter hypodensity consistent with chronic small vessel disease. No focal brain abnormality is evident. The calvarium and skullbase are intact.  IMPRESSION: Negative for acute intracranial traumatic injury. There is generalized atrophy and chronic small vessel ischemic disease.   Electronically Signed   By: Andreas Newport M.D.   On: 12/31/2014 03:49   Dg Chest Port 1 View  12/31/2014   CLINICAL DATA:  [place].  Confused.  Hyperglycemia.  EXAM: PORTABLE CHEST - 1 VIEW  COMPARISON:  11/07/2011  FINDINGS: There is unchanged cardiomegaly and aortic tortuosity. The lungs are clear. The pulmonary vasculature is normal. There is no large effusion.  IMPRESSION: Unchanged cardiomegaly.  No acute findings.   Electronically  Signed   By: Andreas Newport M.D.   On: 12/31/2014 03:17    ROS  See HPI Eyes: glasses Ears:positve for hearing loss,negative tinnitus Cardiovascular: Positive for chest pain, palpitations,irregular heartbeat, dyspnea, dyspnea on exertion, negative near-syncope, orthopnea, paroxysmal nocturnal dyspnea and syncope,edema, claudication, cyanosis,.  Respiratory:   Negative for cough, hemoptysis, shortness of breath, sleep disturbances due to breathing, sputum production and wheezing.   Endocrine: Negative for cold intolerance and heat intolerance.  Hematologic/Lymphatic: Negative for adenopathy and bleeding problem. Does not bruise/bleed easily.  Musculoskeletal: Negative.   Gastrointestinal: Negative for nausea, vomiting, reflux, abdominal pain, diarrhea, constipation.   Genitourinary: Negative for bladder incontinence, dysuria, flank pain, frequency, hematuria, hesitancy, nocturia and urgency.  Neurological: some confusion and memory problems.  Allergic/Immunologic: Negative for environmental  allergies.  Blood pressure 119/77, pulse 96, temperature 98.2 F (36.8 C), temperature source Oral, resp. rate 25, height 5' 6"  (1.676 m), weight 184 lb 8.4 oz (83.7 kg), SpO2 96 %. Physical Exam PHYSICAL EXAM: Well-nournished, in no acute distress. Neck: No JVD, HJR, Bruit, or thyroid enlargement Lungs: No tachypnea, clear without wheezing, rales, or rhonchi Cardiovascular:Irreg irreg, PMI not displaced, heart sounds normal, no murmurs, gallops, bruit, thrill, or heave. Abdomen: BS normal. Soft without organomegaly, masses, lesions or tenderness. Extremities: without cyanosis, clubbing or edema. Good distal pulses bilateral SKin: Warm, no lesions or rashes  Musculoskeletal: No deformities Neuro: no focal signs    Assessment/Plan: Chronic Atrial fibrillation with RVR: patient has been noncompliant with her Xarelto and metoprolol.Rate now better controlled on IV dilatiazem. Transition back to oral metoprolol.  Confusion:  head CT chronic atrophy and small vessel disease. Recommend neurology work up. Also social services to assess needs at home.  Chest pain: has never had before, Troponins flat at 0.05 and could be secondary to atrial fib with RVR, but multiple risk factors for CAD. Will consider ischemic work up with [facility] once more stable.  History of combined systolic and  diastolic CHF EF 46-96% echo 2013. Will need repeat echo.  DM-uncontrolled  HTN  Hyperlipidemia.  Ermalinda Barrios 12/31/2014, 8:20 AM    Attending Note Patient seen and discussed with PA Bonnell Public, I agree with her documentation above. 74 yo female with history of afib, HTN, DM2 admitted with altered mental status. Cardiology is consulted for afib with RVR.  Dig level 2, Hgb 14.2, WBC 12.1, Na 132, K 3.8, Cr 1.03, Mg 1.5, trop 0.05 x 2.  CT head negative CXR cardiomegaly EKG afib rate 145, mild lateral ST depressions 08/2011 echo: LVEF 40-45%, multiple WMAs, severe LAE, mod MR   Afib with RVR, rate  controlled on IV dilt gtt. Will start oral lopressor to wean drip, once rate controlled likely change to Toprol XL if LV systolic dysfunction persists on echo. Continue xarelto. Seems main issue with her afib with RVR was medication noncompliance as opposed to inadequate therapy. Keep K at 4 and Mg at 2. She is somewhat of a limited historial but does describe episodes of sharp left sided chest pani that lasts just a few minutes. Tend to occur at rest, no other associated symptoms. She is unsure how long ago symptoms started or how frequently they occur. Will f/u echo, may need consideration for ischemic testing though etiology of her pain remains unclear at this time, could be due to uncontrolled afib. Mild trop 0.05 in setting of afib with RVR is nonspecific finding   Zandra Abts MD

## 2014-12-31 NOTE — H&P (Addendum)
PCP:   Celedonio Savage, MD   Chief  complaint:  Confusion  HPI: 74 year old female who   has a past medical history of Seasonal allergies; Headache(784.0); Palpitations; Hypertension; Hyperlipidemia; Vertigo; Dyspnea on exertion; Chronic diarrhea; Stress incontinence; Diabetes mellitus, type 2; Bilateral lower extremity edema; Osteopenia; Gout; and Atrial fibrillation (08/2011). Today was brought to the hospital by EMS after patient was found to be in altered mental status. Heparin drip patient had called 911 and when the EMS arrived at her house she was found to be confused. Patient is unable to good history. Though she says that she denies chest pain, no shortness of breath. She felt that her heart was racing. EMS was called for possible fall. CBG was found to be elevated at 426. In the ED patient was found to be in A. fib with RVR and started on Cardizem infusion. CT head was negative for significant pathology Urine was clear except showed many bacteria. She denies nausea vomiting or diarrhea. No fever dysuria. Allergies:   Allergies  Allergen Reactions  . Codeine Anaphylaxis and Hives  . Morphine And Related Anaphylaxis and Hives  . Penicillins Anaphylaxis  . Ace Inhibitors Cough      Past Medical History  Diagnosis Date  . Seasonal allergies   . Headache(784.0)     twice weekly  . Palpitations   . Hypertension   . Hyperlipidemia   . Vertigo   . Dyspnea on exertion     pedal edema  . Chronic diarrhea     diverticulosis  . Stress incontinence   . Diabetes mellitus, type 2     Diabetic neuropathy  . Bilateral lower extremity edema   . Osteopenia     DEXA scan 01/2010  . Gout   . Atrial fibrillation 08/2011    First diagnosed in 08/2011; duration of arrhythmia is uncertain    Past Surgical History  Procedure Laterality Date  . Cesarean section      x 2  . Appendectomy    . Umbilical hernia repair    . Knee arthroscopy w/ meniscal repair  2007    Bilateral  .  Hammer toe surgery      Bilateral hammer toe amputation  . Incisional hernia repair    . Breast biopsy  2002    Right  . Colonoscopy  2007    Negative screening study    Prior to Admission medications   Medication Sig Start Date End Date Taking? Authorizing Provider  albuterol (PROVENTIL HFA;VENTOLIN HFA) 108 (90 BASE) MCG/ACT inhaler Inhale 2 puffs into the lungs every 6 (six) hours as needed. For shortness of breath    Historical Provider, MD  beta carotene w/minerals (OCUVITE) tablet Take 1 tablet by mouth every morning.    Historical Provider, MD  cetirizine (ZYRTEC) 10 MG tablet Take 10 mg by mouth daily.    Historical Provider, MD  cholecalciferol (VITAMIN D) 1000 UNITS tablet Take 1,000 Units by mouth daily.    Historical Provider, MD  citalopram (CELEXA) 20 MG tablet Take 20 mg by mouth daily.    Historical Provider, MD  [place] 125 MCG tablet TAKE (1) TABLET BY MOUTH ONCE DAILY. 09/15/14   Lendon Colonel, NP  digoxin (LANOXIN) 0.125 MG tablet TAKE 1 TABLET ONCE DAILY. 06/05/13   Lendon Colonel, NP  furosemide (LASIX) 40 MG tablet Take 1 tablet (40 mg total) by mouth daily. 12/14/11 12/13/12  Yehuda Savannah, MD  meclizine (ANTIVERT) 12.5 MG tablet Take 12.5 mg  by mouth 3 (three) times daily as needed. For dizziness    Historical Provider, MD  metoprolol (LOPRESSOR) 50 MG tablet TAKE 1 & 1/2 TABLETS BY MOUTH TWICE DAILY. 10/01/14   Lendon Colonel, NP  Multiple Vitamin (MULTIVITAMIN WITH MINERALS) TABS Take 1 tablet by mouth every morning.    Historical Provider, MD  potassium chloride (K-DUR) 10 MEQ tablet TAKE 4 TABLETS ONCE DAILY. 06/05/13   Lendon Colonel, NP  potassium chloride (K-DUR) 10 MEQ tablet TAKE 4 TABLETS ONCE DAILY. 06/06/14   Lendon Colonel, NP  pravastatin (PRAVACHOL) 40 MG tablet Take 1 tablet (40 mg total) by mouth every evening. 06/05/13   Lendon Colonel, NP  Rivaroxaban (XARELTO) 20 MG TABS tablet TAKE (1) TABLET BY MOUTH ONCE DAILY. 06/05/13   Lendon Colonel, NP    Social History:  reports that she has never smoked. She has never used smokeless tobacco. She reports that she does not drink alcohol or use illicit drugs.  Family History  Problem Relation Age of Onset  . AdoptedCaryn Section Weights   12/31/14 0035  Weight: 78.019 kg (172 lb)    All the positives are listed in BOLD  Review of Systems:  As the history of present illness, rest of review of systems unobtainable   Physical Exam: Blood pressure 123/84, pulse 105, temperature 98.6 F (37 C), temperature source Oral, resp. rate 22, height 5\' 6"  (1.676 m), weight 78.019 kg (172 lb), SpO2 98 %. Constitutional:   Patient is a well-developed and well-nourished female in no acute distress and cooperative with exam. Head: Normocephalic and atraumatic Mouth: Mucus membranes moist Eyes: PERRL, EOMI, conjunctivae normal Neck: Supple, No Thyromegaly Cardiovascular: RRR, S1 normal, S2 normal Pulmonary/Chest: CTAB, no wheezes, rales, or rhonchi Abdominal: Soft. Non-tender, non-distended, bowel sounds are normal, no masses, organomegaly, or guarding present.  Neurological: A&O x3, Strength is normal and symmetric bilaterally, cranial nerve II-XII are grossly intact, no focal motor deficit, sensory intact to light touch bilaterally.  Extremities : No Cyanosis, Clubbing or Edema  Labs on Admission:  Basic Metabolic Panel:  Recent Labs Lab 12/31/14 0202  NA 132*  K 3.8  CL 99*  CO2 22  GLUCOSE 459*  BUN 13  CREATININE 1.03*  CALCIUM 8.8*   Liver Function Tests:  Recent Labs Lab 12/31/14 0202  AST 20  ALT 16  ALKPHOS 89  BILITOT 1.0  PROT 7.6  ALBUMIN 3.6   CBC:  Recent Labs Lab 12/31/14 0202  WBC 12.1*  NEUTROABS 9.9*  HGB 14.3  HCT 41.9  MCV 88.6  PLT 144*   Cardiac Enzymes:  Recent Labs Lab 12/31/14 0202  TROPONINI 0.05*     CBG:  Recent Labs Lab 12/31/14 0448  GLUCAP 398*    Radiological Exams on Admission: Ct Head Wo  Contrast  12/31/2014   CLINICAL DATA:  [place].  Hyperglycemia.  EXAM: CT HEAD WITHOUT CONTRAST  TECHNIQUE: Contiguous axial images were obtained from the base of the skull through the vertex without intravenous contrast.  COMPARISON:  None.  FINDINGS: There is no intracranial hemorrhage or extra-axial fluid collection. There is moderate generalized atrophy. There is mild white matter hypodensity consistent with chronic small vessel disease. No focal brain abnormality is evident. The calvarium and skullbase are intact.  IMPRESSION: Negative for acute intracranial traumatic injury. There is generalized atrophy and chronic small vessel ischemic disease.   Electronically Signed   By: Andreas Newport M.D.   On: 12/31/2014  03:49   Dg Chest Port 1 View  12/31/2014   CLINICAL DATA:  [place].  Confused.  Hyperglycemia.  EXAM: PORTABLE CHEST - 1 VIEW  COMPARISON:  11/07/2011  FINDINGS: There is unchanged cardiomegaly and aortic tortuosity. The lungs are clear. The pulmonary vasculature is normal. There is no large effusion.  IMPRESSION: Unchanged cardiomegaly.  No acute findings.   Electronically Signed   By: Andreas Newport M.D.   On: 12/31/2014 03:17    EKG: Independently reviewed. Atrial fibrillation   Assessment/Plan Active Problems:   Hypertension   Chronic diastolic congestive heart failure, NYHA class 2   Diabetes mellitus, type 2   Atrial fibrillation   Chronic anticoagulation   Confusion   Altered mental status  Altered mental status Resolved at this time, questionable cause Will start Levaquin for possible UTI, follow urine culture results  Atrial fibrillation with RVR Continue Cardizem infusion, titrated to keep heart rate less than 100 Continue anticoagulation with XARETO Hold digoxin as the digoxin level is 2.0  Mild elevation of troponin Troponin is elevated to 0.05 Likely from demand ischemia from above Follow serial cardiac enzymes Cardiology consultation  Prolonged QTC QTc  interval is prolonged 506 Will check serum magnesium level Cardiac monitoring in stepdown unit  Hyperglycemia Patient has been started on glucose stabilizes, which will be continued Will switch to sliding scale insulin once blood sugar is less than 212  Chronic diastolic CHF Recurrent decompensated, Lasix on hold Restart Lasix once patient is  stable  Code status: Full code  Family discussion: No family at bedside   Time Spent on Admission: 60 min  [facility] Pager: [phone] 12/31/2014, 5:50 AM  If 7PM-7AM, please contact night-coverage  [email or web address]  Password [email or web address]

## 2014-12-31 NOTE — Progress Notes (Addendum)
PROGRESS NOTE  Christine [name]:[id number] DOB: [birth year] DOA: 12/31/2014 PCP: Celedonio Savage, MD Dr. Bronson Ing   Summary: 59yow presented to ED via EMS with vague history. EMS was called for possible fall, found patient confused, CBG 426. In ED found to have afib with RVR and stared on Cardizem. Admitted for acute encephalopathy, afib RVR, elevated troponin.  Assessment/Plan: 1. Atrial fibrillation with RVR. Secondary to noncompliance (not intentional) on digoxin, metoprolol, Xarelto as an outpatient. Plan below. 2. Demand ischemia secondary to rapid HR. Troponin are flat..Likely from demand ischemia as above. Further recommendations per Cardiology. 3. Acute encephalopathy, resolved. Seems back to baseline. Doubt UTI, though u/a unimpressive. Discontinue Levaquin, follow up urine culture. Afebrile with mild leukocytosis.  4. Suspected dehydration with hyponatremia and hypochloremia. 5. Prolonged QTc.   6. DM type 2 with diabetic neuropathy, associated hyperglycemia on admission, secondary to diet. Usually diet controlled, blood sugars are now stable.  7. Combined systolic and diastolic CHF, Follow up ECHO. 8. Dementia, on Aricept at home per daughter. Forgets to take meds at times. Daughter will check on daily and more closely supervise meds as an outpatient. No indication for neurologic workup.   Overall much better,     Transition to oral medications.   Follow up UC.  Replace magnesium.  F/u echo.  Transfer to medica bed today, discharge home tomorrow, PT evaluation  Code Status: Full DVT prophylaxis: Xarelto  Family Communication: Daughter bedside. Discussed care plan with both. There are no concerns at this time. Disposition Plan: Anticipate discharge home within 24 hours.   [name], MD  [facility]  Pager [phone] If 7PM-7AM, please contact night-coverage at [email or web address], password [facility] 12/31/2014, 6:26 AM  LOS: 0 days    Consultants:  Cardiology  Procedures:    Antibiotics:  Levaquin 8/31>>8/31  HPI/Subjective: Feeling better. Denies nausea, vomiting, or pains. Has not eaten breakfast yet.   Patient recalls falling after losing her balance. She does remember falling and being confused. She reports that she lives alone but her daughter manages her medications. Daughter reports there are times when the patient does not take her medications as prescribed.   Objective: Filed Vitals:   12/31/14 0408 12/31/14 0415 12/31/14 0430 12/31/14 0445  BP: 123/84     Pulse: 122 122 146 105  Temp:      TempSrc:      Resp: 22     Height:      Weight:      SpO2: 99% 99% 98% 98%   No intake or output data in the 24 hours ending 12/31/14 0626   Filed Weights   12/31/14 0035  Weight: 78.019 kg (172 lb)    Exam:  General:  Appears calm and comfortable.   Eyes: PERRL, normal lids, irises & conjunctiva  ENT: grossly normal hearing, lips & tongue  Neck: no LAD, masses or thyromegaly  Cardiovascular: RRR, no m/r/g.Trace BLE edema left greater then right.   Telemetry: Atrial fibrillation, heart rate 90's.   Respiratory: CTA bilaterally, no w/r/r. Normal respiratory effort.  Abdomen: soft, ntnd  Skin: no rash or induration noted  Musculoskeletal: grossly normal tone BUE/BLE  Psychiatric: grossly normal mood and affect, speech fluent and appropriate  Neurologic: grossly non-focal.  New data reviewed:  Troponin .05>> .05. Flat  CBG improved blood sugar under 300  CBC noted   WBC 12.1, plts 144  Mg 1.5  Digoxin level 2.0  Pertinent data since admission:  CT head IMPRESSION: Negative for acute intracranial traumatic injury.  There is generalized atrophy and chronic small vessel ischemic disease.  CXR IMPRESSION: Unchanged cardiomegaly. No acute findings.  EKG Afi RVR, ventricular rate 145, nonspecific ST changes, prolonged QT  Pending data:  UC  Scheduled Meds:   Continuous Infusions: . sodium chloride 100 mL/hr at 12/31/14 0211  . insulin (NOVOLIN-R) infusion 5.3 Units/hr (12/31/14 0555)    Principal Problem:   Atrial fibrillation with rapid ventricular response Active Problems:   Hypertension   Diabetes mellitus, type 2   Chronic anticoagulation   Hyperglycemia   Acute encephalopathy   Chest pain   Time spent 30 minutes  I, Jessica D. Leonie Green, acting as scribe, recorded this note contemporaneously in the presence of Dr. Melene Plan. Sarajane Jews, M.D. on 12/31/2014 .   I have reviewed the above documentation for accuracy and completeness, and I agree with the above. [name], MD

## 2014-12-31 NOTE — ED Provider Notes (Signed)
CSN: [id number]     Arrival date & time 12/31/14  0022 History   First MD Initiated Contact with Patient 12/31/14 0111     Chief Complaint  Patient presents with  . Altered Mental Status   Level V caveat for confusion  (Consider location/radiation/quality/duration/timing/severity/associated sxs/prior Treatment) HPI patient cannot tell me what happened tonight. She does not know how EMS got to her house. She states she may follow but she cannot remember. She denies any pain. She states she did have an episode where she "felt bad" but she can't tell me when that was earlier what happened. She denies chest pain or shortness of breath but she does states she can feel her heart is racing. EMS reported they were unable to contact [place]. They were called for possible fall. However when they got to the patient's residence they noted the patient seemed confused. Her CBG was noted to be elevated at 426.  PCP Dr Wenda Overland  Past Medical History  Diagnosis Date  . Seasonal allergies   . Headache(784.0)     twice weekly  . Palpitations   . Hypertension   . Hyperlipidemia   . Vertigo   . Dyspnea on exertion     pedal edema  . Chronic diarrhea     diverticulosis  . Stress incontinence   . Diabetes mellitus, type 2     Diabetic neuropathy  . Bilateral lower extremity edema   . Osteopenia     DEXA scan 01/2010  . Gout   . Atrial fibrillation 08/2011    First diagnosed in 08/2011; duration of arrhythmia is uncertain   Past Surgical History  Procedure Laterality Date  . Cesarean section      x 2  . Appendectomy    . Umbilical hernia repair    . Knee arthroscopy w/ meniscal repair  2007    Bilateral  . Hammer toe surgery      Bilateral hammer toe amputation  . Incisional hernia repair    . Breast biopsy  2002    Right  . Colonoscopy  2007    Negative screening study   Family History  Problem Relation Age of Onset  . Adopted: Yes   Social History   Substance Use Topics  . Smoking status: Never Smoker   . Smokeless tobacco: Never Used  . Alcohol Use: No   Lives at home Lives alone  OB History    No data available     Review of Systems  All other systems reviewed and are negative.     Allergies  Codeine; Morphine and related; Penicillins; and Ace inhibitors  Home Medications   Prior to Admission medications   Medication Sig Start Date End Date Taking? Authorizing Provider  albuterol (PROVENTIL HFA;VENTOLIN HFA) 108 (90 BASE) MCG/ACT inhaler Inhale 2 puffs into the lungs every 6 (six) hours as needed. For shortness of breath    Historical Provider, MD  beta carotene w/minerals (OCUVITE) tablet Take 1 tablet by mouth every morning.    Historical Provider, MD  cetirizine (ZYRTEC) 10 MG tablet Take 10 mg by mouth daily.    Historical Provider, MD  cholecalciferol (VITAMIN D) 1000 UNITS tablet Take 1,000 Units by mouth daily.    Historical Provider, MD  citalopram (CELEXA) 20 MG tablet Take 20 mg by mouth daily.    Historical Provider, MD  Martin 125 MCG tablet TAKE (1) TABLET BY MOUTH ONCE DAILY. 09/15/14   Lendon Colonel,  NP  digoxin (LANOXIN) 0.125 MG tablet TAKE 1 TABLET ONCE DAILY. 06/05/13   Lendon Colonel, NP  furosemide (LASIX) 40 MG tablet Take 1 tablet (40 mg total) by mouth daily. 12/14/11 12/13/12  Yehuda Savannah, MD  meclizine (ANTIVERT) 12.5 MG tablet Take 12.5 mg by mouth 3 (three) times daily as needed. For dizziness    Historical Provider, MD  metoprolol (LOPRESSOR) 50 MG tablet TAKE 1 & 1/2 TABLETS BY MOUTH TWICE DAILY. 10/01/14   Lendon Colonel, NP  Multiple Vitamin (MULTIVITAMIN WITH MINERALS) TABS Take 1 tablet by mouth every morning.    Historical Provider, MD  potassium chloride (K-DUR) 10 MEQ tablet TAKE 4 TABLETS ONCE DAILY. 06/05/13   Lendon Colonel, NP  potassium chloride (K-DUR) 10 MEQ tablet TAKE 4 TABLETS ONCE DAILY. 06/06/14   Lendon Colonel, NP  pravastatin (PRAVACHOL) 40 MG tablet  Take 1 tablet (40 mg total) by mouth every evening. 06/05/13   Lendon Colonel, NP  Rivaroxaban (XARELTO) 20 MG TABS tablet TAKE (1) TABLET BY MOUTH ONCE DAILY. 06/05/13   Lendon Colonel, NP   BP 136/94 mmHg  Pulse 141  Temp(Src) 98.6 F (37 C) (Oral)  Resp 21  Ht 5\' 6"  (1.676 m)  Wt 172 lb (78.019 kg)  BMI 27.77 kg/m2  SpO2 92%  Vital signs normal except for tachycardia  Physical Exam  Constitutional: She appears well-developed and well-nourished.  Non-toxic appearance. She does not appear ill. No distress.  HENT:  Head: Normocephalic and atraumatic.  Right Ear: External ear normal.  Left Ear: External ear normal.  Nose: Nose normal. No mucosal edema or rhinorrhea.  Mouth/Throat: Oropharynx is clear and moist and mucous membranes are normal. No dental abscesses or uvula swelling.  Eyes: Conjunctivae and EOM are normal. Pupils are equal, round, and reactive to light.  Neck: Normal range of motion and full passive range of motion without pain. Neck supple.  Cardiovascular: Normal heart sounds.  An irregularly irregular rhythm present. Tachycardia present.  Exam reveals no gallop and no friction rub.   No murmur heard. Pulmonary/Chest: Effort normal and breath sounds normal. No respiratory distress. She has no wheezes. She has no rhonchi. She has no rales. She exhibits no tenderness and no crepitus.  Abdominal: Soft. Normal appearance and bowel sounds are normal. She exhibits no distension. There is no tenderness. There is no rebound and no guarding.  Musculoskeletal: Normal range of motion. She exhibits no edema or tenderness.  Moves all extremities well.   Neurological: She is alert. She has normal strength. No cranial nerve deficit.  Patient cannot tell me what year it is. She does know she is at [facility].  Skin: Skin is warm, dry and intact. No rash noted. No erythema. No pallor.  Psychiatric: She has a normal mood and affect. Her speech is normal and behavior is  normal. Her mood appears not anxious.  Nursing note and vitals reviewed.   ED Course  Procedures (including critical care time)  Medications  0.9 %  sodium chloride infusion ( Intravenous New Bag/Given 12/31/14 0211)  insulin regular (NOVOLIN R,HUMULIN R) 250 Units in sodium chloride 0.9 % 250 mL (1 Units/mL) infusion (3.4 Units/hr Intravenous New Bag/Given 12/31/14 0455)  sulfamethoxazole-trimethoprim (BACTRIM DS,SEPTRA DS) 800-160 MG per tablet 1 tablet (not administered)  diltiazem (CARDIZEM) 100 mg in dextrose 5 % 100 mL (1 mg/mL) infusion (15 mg/hr Intravenous Rate/Dose Change 12/31/14 0524)  diltiazem (CARDIZEM) injection 5 mg (5 mg Intravenous Given  12/31/14 0208)   Patient was noted to be in atrial fibrillation with fast ventricular response. She was started on Cardizem bolus and drip. Her rate was controlled to 105 bpm.  Patient was evaluated for her altered mental status and confusion. Unfortunately there is no family available to tell as this is a old problem or a new problem. Looking at her CT scan however it may be an old problem. It was felt she should be admitted since she lives home alone.   05:29 Dr Darrick Meigs, admit to tele, give rocephin IV for poss UTI until cultures return.   Patient noted to have severe penicillin allergy. She was started on Septra DS for her possible urinary tract infection.   Labs Review Results for orders placed or performed during the hospital encounter of 12/31/14  Urinalysis, Routine w reflex microscopic (not at [facility])  Result Value Ref Range   Color, Urine YELLOW YELLOW   APPearance CLEAR CLEAR   Specific Gravity, Urine 1.010 1.005 - 1.030   pH 5.0 5.0 - 8.0   Glucose, UA >1000 (A) NEGATIVE mg/dL   Hgb urine dipstick TRACE (A) NEGATIVE   Bilirubin Urine NEGATIVE NEGATIVE   Ketones, ur TRACE (A) NEGATIVE mg/dL   Protein, ur NEGATIVE NEGATIVE mg/dL   Urobilinogen, UA 0.2 0.0 - 1.0 mg/dL   Nitrite NEGATIVE NEGATIVE   Leukocytes, UA NEGATIVE  NEGATIVE  Comprehensive metabolic panel  Result Value Ref Range   Sodium 132 (L) 135 - 145 mmol/L   Potassium 3.8 3.5 - 5.1 mmol/L   Chloride 99 (L) 101 - 111 mmol/L   CO2 22 22 - 32 mmol/L   Glucose, Bld 459 (H) 65 - 99 mg/dL   BUN 13 6 - 20 mg/dL   Creatinine, Ser 1.03 (H) 0.44 - 1.00 mg/dL   Calcium 8.8 (L) 8.9 - 10.3 mg/dL   Total Protein 7.6 6.5 - 8.1 g/dL   Albumin 3.6 3.5 - 5.0 g/dL   AST 20 15 - 41 U/L   ALT 16 14 - 54 U/L   Alkaline Phosphatase 89 38 - 126 U/L   Total Bilirubin 1.0 0.3 - 1.2 mg/dL   GFR calc non Af Amer 52 (L) >60 mL/min   GFR calc Af Amer >60 >60 mL/min   Anion gap 11 5 - 15  Troponin I  Result Value Ref Range   Troponin I 0.05 (H) <0.031 ng/mL  CBC with Differential  Result Value Ref Range   WBC 12.1 (H) 4.0 - 10.5 K/uL   RBC 4.73 3.87 - 5.11 MIL/uL   Hemoglobin 14.3 12.0 - 15.0 g/dL   HCT 41.9 36.0 - 46.0 %   MCV 88.6 78.0 - 100.0 fL   MCH 30.2 26.0 - 34.0 pg   MCHC 34.1 30.0 - 36.0 g/dL   RDW 12.9 11.5 - 15.5 %   Platelets 144 (L) 150 - 400 K/uL   Neutrophils Relative % 82 (H) 43 - 77 %   Neutro Abs 9.9 (H) 1.7 - 7.7 K/uL   Lymphocytes Relative 12 12 - 46 %   Lymphs Abs 1.4 0.7 - 4.0 K/uL   Monocytes Relative 6 3 - 12 %   Monocytes Absolute 0.8 0.1 - 1.0 K/uL   Eosinophils Relative 0 0 - 5 %   Eosinophils Absolute 0.0 0.0 - 0.7 K/uL   Basophils Relative 0 0 - 1 %   Basophils Absolute 0.0 0.0 - 0.1 K/uL  Digoxin level  Result Value Ref Range   Digoxin Level  2.0 0.8 - 2.0 ng/mL  Urine microscopic-add on  Result Value Ref Range   Squamous Epithelial / LPF RARE RARE   Bacteria, UA MANY (A) RARE   Casts GRANULAR CAST (A) NEGATIVE  CBG monitoring, ED  Result Value Ref Range   Glucose-Capillary 398 (H) 65 - 99 mg/dL   Comment 1 Notify RN    Comment 2 Document in Chart    Laboratory interpretation all normal except hyperglycemia, ? UTI, leukocytosis   Imaging Review Ct Head Wo Contrast  12/31/2014   CLINICAL DATA:  [place].   Hyperglycemia.  EXAM: CT HEAD WITHOUT CONTRAST  TECHNIQUE: Contiguous axial images were obtained from the base of the skull through the vertex without intravenous contrast.  COMPARISON:  None.  FINDINGS: There is no intracranial hemorrhage or extra-axial fluid collection. There is moderate generalized atrophy. There is mild white matter hypodensity consistent with chronic small vessel disease. No focal brain abnormality is evident. The calvarium and skullbase are intact.  IMPRESSION: Negative for acute intracranial traumatic injury. There is generalized atrophy and chronic small vessel ischemic disease.   Electronically Signed   By: Andreas Newport M.D.   On: 12/31/2014 03:49   Dg Chest Port 1 View  12/31/2014   CLINICAL DATA:  [place].  Confused.  Hyperglycemia.  EXAM: PORTABLE CHEST - 1 VIEW  COMPARISON:  11/07/2011  FINDINGS: There is unchanged cardiomegaly and aortic tortuosity. The lungs are clear. The pulmonary vasculature is normal. There is no large effusion.  IMPRESSION: Unchanged cardiomegaly.  No acute findings.   Electronically Signed   By: Andreas Newport M.D.   On: 12/31/2014 03:17   I have personally reviewed and evaluated these images and lab results as part of my medical decision-making.   EKG Interpretation   Date/Time:  Wednesday December 31 2014 00:34:09 EDT Ventricular Rate:  145 PR Interval:    QRS Duration: 83 QT Interval:  326 QTC Calculation: 506 R Axis:   34 Text Interpretation:  Atrial fibrillation Repolarization abnormality, prob  rate related Prolonged QT interval Since last tracing rate faster (04 Nov 2011) Confirmed by Fatmata Legere  MD-I, Chea Malan ([id number]) on 12/31/2014 12:43:17 AM      MDM   Final diagnoses:  Tachycardia  Atrial fibrillation with rapid ventricular response  Confusion  Hyperglycemia    Plan admission  Rolland Porter, MD, Barbette Or, MD 12/31/14 [phone]

## 2015-01-01 DIAGNOSIS — E119 Type 2 diabetes mellitus without complications: Secondary | ICD-10-CM | POA: Diagnosis not present

## 2015-01-01 DIAGNOSIS — R0789 Other chest pain: Secondary | ICD-10-CM | POA: Diagnosis not present

## 2015-01-01 DIAGNOSIS — I4891 Unspecified atrial fibrillation: Secondary | ICD-10-CM

## 2015-01-01 DIAGNOSIS — Z7901 Long term (current) use of anticoagulants: Secondary | ICD-10-CM | POA: Diagnosis not present

## 2015-01-01 DIAGNOSIS — G9340 Encephalopathy, unspecified: Secondary | ICD-10-CM | POA: Diagnosis not present

## 2015-01-01 DIAGNOSIS — I5032 Chronic diastolic (congestive) heart failure: Secondary | ICD-10-CM | POA: Diagnosis not present

## 2015-01-01 LAB — GLUCOSE, CAPILLARY
GLUCOSE-CAPILLARY: 296 mg/dL — AB (ref 65–99)
Glucose-Capillary: 422 mg/dL — ABNORMAL HIGH (ref 65–99)
Glucose-Capillary: 93 mg/dL (ref 65–99)
Glucose-Capillary: 284 mg/dL — ABNORMAL HIGH (ref 65–99)

## 2015-01-01 LAB — URINE CULTURE: CULTURE: NO GROWTH

## 2015-01-01 MED ORDER — MAGNESIUM OXIDE 400 (241.3 MG) MG PO TABS
400.0000 mg | ORAL_TABLET | Freq: Every day | ORAL | Status: DC
  Administered 2015-01-02: 400 mg via ORAL
  Filled 2015-01-01: qty 1

## 2015-01-01 MED ORDER — INSULIN ASPART 100 UNIT/ML ~~LOC~~ SOLN
0.0000 [IU] | Freq: Every day | SUBCUTANEOUS | Status: DC
  Administered 2015-01-01: 3 [IU] via SUBCUTANEOUS

## 2015-01-01 MED ORDER — METOPROLOL TARTRATE 50 MG PO TABS
100.0000 mg | ORAL_TABLET | Freq: Two times a day (BID) | ORAL | Status: DC
  Administered 2015-01-01: 100 mg via ORAL
  Filled 2015-01-01: qty 2

## 2015-01-01 MED ORDER — DIGOXIN 125 MCG PO TABS
0.1250 mg | ORAL_TABLET | Freq: Every day | ORAL | Status: DC
  Administered 2015-01-01 – 2015-01-02 (×2): 0.125 mg via ORAL
  Filled 2015-01-01 (×2): qty 1

## 2015-01-01 MED ORDER — INSULIN ASPART 100 UNIT/ML ~~LOC~~ SOLN: 0.0000 [IU] | Freq: Three times a day (TID) | SUBCUTANEOUS | Status: DC

## 2015-01-01 MED ORDER — INSULIN ASPART 100 UNIT/ML ~~LOC~~ SOLN
0.0000 [IU] | Freq: Three times a day (TID) | SUBCUTANEOUS | Status: DC
  Administered 2015-01-01: 15 [IU] via SUBCUTANEOUS
  Administered 2015-01-02: 5 [IU] via SUBCUTANEOUS
  Administered 2015-01-02: 15 [IU] via SUBCUTANEOUS

## 2015-01-01 MED ORDER — INSULIN ASPART 100 UNIT/ML ~~LOC~~ SOLN: 0.0000 [IU] | Freq: Every day | SUBCUTANEOUS | Status: DC

## 2015-01-01 MED ORDER — METOPROLOL TARTRATE 25 MG PO TABS
25.0000 mg | ORAL_TABLET | Freq: Once | ORAL | Status: AC
  Administered 2015-01-01: 25 mg via ORAL
  Filled 2015-01-01: qty 1

## 2015-01-01 MED ORDER — INSULIN DETEMIR 100 UNIT/ML ~~LOC~~ SOLN
5.0000 [IU] | Freq: Every day | SUBCUTANEOUS | Status: DC
  Administered 2015-01-01 – 2015-01-02 (×2): 5 [IU] via SUBCUTANEOUS
  Filled 2015-01-01 (×5): qty 0.05

## 2015-01-01 MED ORDER — ZOLPIDEM TARTRATE 5 MG PO TABS
5.0000 mg | ORAL_TABLET | Freq: Every evening | ORAL | Status: DC | PRN
  Administered 2015-01-01: 5 mg via ORAL
  Filled 2015-01-01: qty 1

## 2015-01-01 NOTE — Progress Notes (Signed)
Physical Therapy Treatment Patient Details Name: Christine [name] MRN: [id number] DOB: 06-Nov-1940 Today's Date: 01/01/2015    History of Present Illness      PT Comments    Pt demonstrated improved mobility in bed and transfer training.  Pt demonstrated decreased awareness of safety that with increase risk of falls.  Gait training complete with RW to restroom and around room, cueing to increase stride length and stay within walker during gait training with min guard.  Pt left in chair with call bell within reach, chair alarm set and daughter in room.  No reports of pain through session, pt was limited by fatigue with activity.  Follow Up Recommendations        Equipment Recommendations       Recommendations for Other Services       Precautions / Restrictions Precautions Precautions: Fall Restrictions Weight Bearing Restrictions: No    Mobility  Bed Mobility   Bed Mobility: Supine to Sit     Supine to sit: Min assist     General bed mobility comments: Cueing for handplacement for safety and assistance with supine to sitting  Transfers Overall transfer level: Needs assistance Equipment used: Rolling walker (2 wheeled) Transfers: Sit to/from Stand Sit to Stand: Min guard         General transfer comment: Cueing for handplacement for safety with sit to stand  Ambulation/Gait Ambulation/Gait assistance: Min guard Ambulation Distance (Feet): 35 Feet Assistive device: Rolling walker (2 wheeled) Gait Pattern/deviations: Wide base of support;Step-through pattern;Decreased stride length;Shuffle   Gait velocity interpretation: Below normal speed for age/gender General Gait Details: very labored, appearing weak and unsteady.   Stairs            Wheelchair Mobility    Modified Rankin (Stroke Patients Only)       Balance                                    Cognition Arousal/Alertness: Awake/alert Behavior During Therapy: WFL for tasks  assessed/performed Overall Cognitive Status: History of cognitive impairments - at baseline       Memory: Decreased short-term memory              Exercises General Exercises - Lower Extremity Ankle Circles/Pumps: AROM;Both;10 reps;Seated Long Arc Quad: Both;10 reps;Seated    General Comments        Pertinent Vitals/Pain Pain Assessment: No/denies pain    Home Living                      Prior Function            PT Goals (current goals can now be found in the care plan section) Progress towards PT goals: Progressing toward goals    Frequency       PT Plan Current plan remains appropriate    Co-evaluation             End of Session Equipment Utilized During Treatment: Gait belt Activity Tolerance: Patient tolerated treatment well;Patient limited by fatigue Patient left: in chair;with call bell/phone within reach;with chair alarm set;with family/visitor present     Time: 3825-0539 PT Time Calculation (min) (ACUTE ONLY): 38 min  Charges:  $Gait Training: 8-22 mins $Therapeutic Exercise: 8-22 mins $Therapeutic Activity: 8-22 mins                    G Codes:  Aldona Lento 01/01/2015, 2:52 PM

## 2015-01-01 NOTE — Clinical Social Work Note (Signed)
Clinical Social Work Assessment  Patient Details  Name: Christine [name] MRN: [id number] Date of Birth: [birth year]  Date of referral:  01/01/15               Reason for consult:  Facility Placement                Permission sought to share information with:    Permission granted to share information::     Name::        Agency::     Relationship::     Contact Information:     Housing/Transportation Living arrangements for the past 2 months:  Single Family Home Source of Information:  Adult Children Patient Interpreter Needed:  None Criminal Activity/Legal Involvement Pertinent to Current Situation/Hospitalization:  No - Comment as needed Significant Relationships:  Adult Children Lives with:  Self Do you feel safe going back to the place where you live?  Yes Need for family participation in patient care:  Yes (Comment)  Care giving concerns:  Patient lives alone.  Her daughter, Christine [name], goes to her home every other day.  She states that due to patient not taking her medication, which created the need for hospital admission, she will go to patient's house daily to give her her medications at appropriate times.    Social Worker assessment / plan:  Patient's daughter Christine [name], advised that at baseline patient is independent with ambulations, drives, and bathes herself and takes care of her dog independently. Renee advised that patient has a walker and a wheelchair in the home, but she doesn't use it. She stated that she typically manages patients fiances, shopping, appointments, and all other needs. CSW discussed SNF option.  Renee advised that patient would likely want to go home because she is worried about her dog.  CSW provided a SNF list to family. Renee advised that she would if they chose SNF she would like for patient to remain in [place].  She requested that CSW send clinicals to [facility] and [facility].  She stated that she may also want to choose the home health option.    Employment  status:  Retired Nurse, adult PT Recommendations:  [place] / Referral to community resources:  [place]  Patient/Family's Response to care:  Family is agreeable to SNF but not completely sure.   Patient/Family's Understanding of and Emotional Response to Diagnosis, Current Treatment, and Prognosis:  Patient and family verbalize understanding of diagnosis, treatment and prognosis.    Emotional Assessment Appearance:  Developmentally appropriate Attitude/Demeanor/Rapport:   (Cooperative) Affect (typically observed):  Calm (Confused) Orientation:  Oriented to Self, Oriented to Place, Oriented to Situation Alcohol / Substance use:  Not Applicable Psych involvement (Current and /or in the community):     Discharge Needs  Concerns to be addressed:  Discharge Planning Concerns Readmission within the last 30 days:  No Current discharge risk:  None Barriers to Discharge:  No Barriers Identified   Ihor Gully, LCSW 01/01/2015, 11:08 AM [phone]

## 2015-01-01 NOTE — Progress Notes (Addendum)
Consulting cardiologist: Carlyle Dolly MD Primary Cardiologist: Kate Sable   Cardiology Specific Problem List: 1. Atrial fib 2. Mixed CHF 3. Hypertension   Subjective:    Feels much better, no recurrent chest pain. Breathing better. Complains of being cold.  Objective:   Temp:  [97.9 F (36.6 C)-98.1 F (36.7 C)] 97.9 F (36.6 C) (09/01 0626) Pulse Rate:  [64-108] 64 (09/01 0626) Resp:  [18-22] 18 (09/01 0626) BP: (95-137)/(53-97) 137/97 mmHg (09/01 0626) SpO2:  [95 %-99 %] 99 % (09/01 0626)    Filed Weights   12/31/14 0035 12/31/14 0647  Weight: 172 lb (78.019 kg) 184 lb 8.4 oz (83.7 kg)    Intake/Output Summary (Last 24 hours) at 01/01/15 0901 Last data filed at 12/31/14 1800  Gross per 24 hour  Intake    100 ml  Output      0 ml  Net    100 ml    Telemetry:  Exam:  General: No acute distress.  HEENT: Conjunctiva and lids normal, oropharynx clear.  Lungs: Crackles in the bases.   Cardiac: No elevated JVP or bruits. RRR, no gallop or rub.   Abdomen: Normoactive bowel sounds, nontender, nondistended.  Extremities: No pitting edema, distal pulses full.  Neuropsychiatric: Alert and oriented x3, affect appropriate.   Lab Results:  Basic Metabolic Panel:  Recent Labs Lab 12/31/14 0202 12/31/14 0207  NA 132*  --   K 3.8  --   CL 99*  --   CO2 22  --   GLUCOSE 459*  --   BUN 13  --   CREATININE 1.03*  --   CALCIUM 8.8*  --   MG  --  1.5*    Liver Function Tests:  Recent Labs Lab 12/31/14 0202  AST 20  ALT 16  ALKPHOS 89  BILITOT 1.0  PROT 7.6  ALBUMIN 3.6    CBC:  Recent Labs Lab 12/31/14 0202  WBC 12.1*  HGB 14.3  HCT 41.9  MCV 88.6  PLT 144*    Cardiac Enzymes:  Recent Labs Lab 12/31/14 0656 12/31/14 1213 12/31/14 1842  TROPONINI 0.05* 0.04* 0.03   Echocardiogram 12/31/2014 Left ventricle: The cavity size was normal. Wall thickness was increased in a pattern of mild LVH. Systolic function was  normal. The estimated ejection fraction was in the range of 55% to 60%. Wall motion was normal; there were no regional wall motion abnormalities. The study was not technically sufficient to allow evaluation of LV diastolic dysfunction due to atrial fibrillation. - Aortic valve: Mildly calcified annulus. Trileaflet; mildly thickened leaflets. Valve area (VTI): 2.03 cm^2. Valve area (Vmax): 2.24 cm^2. - Mitral valve: Mildly calcified annulus. Mildly thickened leaflets . There was mild regurgitation. - Left atrium: The atrium was severely dilated. - Right atrium: The atrium was severely dilated. - Pulmonary arteries: Systolic pressure was moderately increased. PA peak pressure: 46 mm Hg (S). - Technically adequate study.  Radiology: Ct Head Wo Contrast  12/31/2014   CLINICAL DATA:  [place].  Hyperglycemia.  EXAM: CT HEAD WITHOUT CONTRAST  TECHNIQUE: Contiguous axial images were obtained from the base of the skull through the vertex without intravenous contrast.  COMPARISON:  None.  FINDINGS: There is no intracranial hemorrhage or extra-axial fluid collection. There is moderate generalized atrophy. There is mild white matter hypodensity consistent with chronic small vessel disease. No focal brain abnormality is evident. The calvarium and skullbase are intact.  IMPRESSION: Negative for acute intracranial traumatic injury. There is generalized atrophy and chronic  small vessel ischemic disease.   Electronically Signed   By: Andreas Newport M.D.   On: 12/31/2014 03:49   Dg Chest Port 1 View  12/31/2014   CLINICAL DATA:  [place].  Confused.  Hyperglycemia.  EXAM: PORTABLE CHEST - 1 VIEW  COMPARISON:  11/07/2011  FINDINGS: There is unchanged cardiomegaly and aortic tortuosity. The lungs are clear. The pulmonary vasculature is normal. There is no large effusion.  IMPRESSION: Unchanged cardiomegaly.  No acute findings.   Electronically Signed   By: Andreas Newport M.D.   On: 12/31/2014  03:17     Medications:   Scheduled Medications: . citalopram  20 mg Oral Daily  . insulin aspart  0-5 Units Subcutaneous QHS  . insulin aspart  0-9 Units Subcutaneous TID WC  . magnesium oxide  400 mg Oral BID  . metoprolol  75 mg Oral BID  . pravastatin  40 mg Oral QPM  . rivaroxaban  20 mg Oral Daily      PRN Medications: acetaminophen **OR** acetaminophen, meclizine, ondansetron **OR** ondansetron (ZOFRAN) IV   Assessment and Plan:   1. Atrial Fibrillation: Heart rate remains elevated with minimal exertion. She is now transitioned back to po metoprolol with higher dose at 75 mg BID. She had been kept NPO this am for possible ischemic testing and has not yet received this medication this am. There is no ischemic testing planned now, as troponin has trended down, and she is asymptomatic. She remains on Xarelto with CHADS VASC Score of 4.   2.Chest Pain: Completely resolved this am. She is a little dyspneic related to elevated HR with activity but did not have any pain walking to the bathroom. She troponin is flat. Echo did not show evidence of regional WMA. Will continue medical management. She will be seen on OP follow up and discuss need for stress test if necessary at that time, if symptoms are recurrent on a consistent medical regimen.  3.Combined Systolic and Diastolic CHF: Echo demonstrates improvement in systolic function to 33%. No evidence of decompensation. Magnesium 1.5 yesterday. She is on a PPI and will need daily replacement of magnesium. Will begin 400 mg daily.   Phill Myron. Lawrence NP [place]  01/01/2015, 9:01 AM   Attending Note  Patient seen and discussed with NP Purcell Nails, I agree with her documentation. Admitted with afib with RVR, rate controlled with IV dilt then transitioned to oral lopressor. Appears she was not compliant with her home lopressor as the cause of her afib with RVR. Chest pain has resolved with rate control, mild troponin elevation to 0.05 due to  demand ischemia with RVR that also trended down with rate control, do not suspect ACS. Echo shows LVEF as actually improved to 55-60%, no WMAs. Afib rates increasing this AM to 120s, we will give additional dose of lopressor 25mg  (change her to 100mg  bid) and restart her digoxin. Follow rates into the afternoon, pending rates may consider discharge.    Zandra Abts MD

## 2015-01-01 NOTE — Progress Notes (Signed)
Nutrition Brief Note  Patient identified on the Malnutrition Screening Tool (MST) Report  Wt Readings from Last 15 Encounters:  12/31/14 184 lb 8.4 oz (83.7 kg)  06/05/13 172 lb (78.019 kg)  12/04/12 165 lb (74.844 kg)  05/16/12 166 lb 12.8 oz (75.66 kg)  04/02/12 172 lb (78.019 kg)  12/01/11 177 lb (80.287 kg)  11/08/11 182 lb 5.1 oz (82.7 kg)  09/21/11 189 lb (85.73 kg)  09/02/11 190 lb (86.183 kg)  Admit weight 172  Patient is fairly confused. Spoke with daughter who thought that the patient had mentioned to PT that she wasn't eating too well recently even though she has.   Daughter reports patient eating well and no weight changes which is consistent with EPIC documentation  Body mass index is 29.8 kg/(m^2). Patient meets criteria for Overweight based on current BMI.   Current diet order is Heart Healthy, patient is consuming approximately only 10% of meals at this time. Labs and medications reviewed.   Daughter states that patient has been able to control her DM with her diet up until this point. However she was admitted with significantly elevated BG (398). She asked for some more education regarding this diet so patient would not have to start insulin.   Agreed upon time to meet with patient's daughter tomorrow to go over DM diet   Burtis Junes RD, LDN Nutrition Pager: [phone] 01/01/2015 4:28 PM

## 2015-01-01 NOTE — Progress Notes (Signed)
Inpatient Diabetes Program Recommendations  AACE/ADA: New Consensus Statement on Inpatient Glycemic Control (2013)  Target Ranges:  Prepandial:   less than 140 mg/dL      Peak postprandial:   less than 180 mg/dL (1-2 hours)      Critically ill patients:  140 - 180 mg/dL  Results for ADAIRA, CENTOLA (MRN [id number]) as of 01/01/2015 08:28  Ref. Range 12/31/2014 09:10 12/31/2014 11:36 12/31/2014 16:47 12/31/2014 21:04 01/01/2015 07:57  Glucose-Capillary Latest Ref Range: 65-99 mg/dL 157 (H) 162 (H) 301 (H) 287 (H) 296 (H)   Results for MARCIE, SHEARON (MRN [id number]) as of 01/01/2015 08:28  Ref. Range 07/04/2012 13:26  Hemoglobin A1C Latest Ref Range: <5.7 % 7.5 (H)    Diabetes history: DM2 Outpatient Diabetes medications: None Current orders for Inpatient glycemic control: Novolog 0-9 units TID with meals, Novolog 0-5 units HS  Inpatient Diabetes Program Recommendations Outpatient Diabetes control: Glucose has ranged from 287-301 mg/dl since being transitioned of IV insulin drip. Fasting glucose is 296 mg/dl this morning. There are not any DM medications listed on home medication list. Patient will need to be prescribed DM medication at time of discharge for glycemic control. HgbA1C: Last A1C in the chart was 7.5% on 07/04/12. Current A1C is still pending.   Thanks, Barnie Alderman, RN, MSN, CCRN, CDE Diabetes Coordinator Inpatient Diabetes Program [phone] (Team Pager from [place] to [place]) [phone] ([place] office) [phone] [facility] office) [phone] [facility] office)

## 2015-01-01 NOTE — Progress Notes (Signed)
PROGRESS NOTE  Christine [name] IWL:[id number] DOB: 1940-10-01 DOA: 12/31/2014 PCP: Celedonio Savage, MD Dr. Bronson Ing   Summary: 20yow presented to ED via EMS with vague history. EMS was called for possible fall, found patient confused, CBG 426. In ED found to have afib with RVR and stared on Cardizem. Admitted for acute encephalopathy, afib RVR, elevated troponin.  Assessment/Plan: 1. Atrial fibrillation with RVR, rate higher today. Beta-blocker increased and digoxin resumesd today. Secondary to noncompliance (not intentional) on digoxin, metoprolol, Xarelto as an outpatient.  2. Demand ischemia secondary to rapid HR. Troponins flat. No further evaluation per cardiology. 3. Acute encephalopathy, resolved. Back to baseline, urine cultures were negative. 4. Suspected dehydration with hyponatremia and hypochloremia. 5. Prolonged QTc.   6. 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 CBG high. 7. Chronic diastolic CHF. 8. Dementia, on Aricept at home per daughter. Forgets to take meds at times. Daughter will check on daily and more closely supervise meds as an outpatient. No indication for neurologic workup.   Overall much better, agree with increase in beta-blocker and resuming digoxin.  Once heart rate controlled plan transfer to SNF.  Start Levemir for hyperglycemia, change SSI to moderate  Code Status: Full DVT prophylaxis: Xarelto  Family Communication: Discussed care plan with patient. There are no concerns at this time. Disposition Plan: Anticipate discharge home tomorrow.   [name], MD  [facility]  Pager [phone] If 7PM-7AM, please contact night-coverage at [email or web address], password [facility] 01/01/2015, 12:07 PM  LOS: 1 day   Consultants:  Cardiology  Procedures:  Echo  Left ventricle: The cavity size was normal. Wall thickness was increased in a pattern of mild LVH. Systolic function was normal. The  estimated ejection fraction was in the range of 55% to 60%. Wall motion was normal; there were no regional wall motion abnormalities. The study was not technically sufficient to allow evaluation of LV diastolic dysfunction due to atrial fibrillation. - Aortic valve: Mildly calcified annulus. Trileaflet; mildly thickened leaflets. Valve area (VTI): 2.03 cm^2. Valve area (Vmax): 2.24 cm^2. - Mitral valve: Mildly calcified annulus. Mildly thickened leaflets . There was mild regurgitation. - Left atrium: The atrium was severely dilated. - Right atrium: The atrium was severely dilated. - Pulmonary arteries: Systolic pressure was moderately increased. PA peak pressure: 46 mm Hg (S). - Technically adequate study.  Antibiotics:  Levaquin 8/31>>8/31  HPI/Subjective: Feels a lot better then yesterday. Was able to sleep and eat without difficulty. Breathing is still intermittently rapid. Palpitations yesterday. No reports of chest pain, n/v/d, or abd pain.  Objective: Filed Vitals:   12/31/14 2106 01/01/15 0626 01/01/15 1120 01/01/15 1121  BP: 116/79 137/97 109/73   Pulse: 76 64  92  Temp:  97.9 F (36.6 C)    TempSrc:  Oral    Resp: 20 18    Height:      Weight:      SpO2: 97% 99%      Intake/Output Summary (Last 24 hours) at 01/01/15 1207 Last data filed at 12/31/14 1800  Gross per 24 hour  Intake      0 ml  Output      0 ml  Net      0 ml     Filed Weights   12/31/14 0035 12/31/14 0647  Weight: 78.019 kg (172 lb) 83.7 kg (184 lb 8.4 oz)    Exam: General:  Appears calm and comfortable Eyes: PERRL, normal lids, irises & conjunctiva ENT: grossly  normal hearing, lips & tongue Cardiovascular: Tachycardic, irregular rhythm, no m/r/g.1+BLE edema Telemetry: atrial fibrillation, 120s Respiratory: CTA bilaterally, no w/r/r. Normal respiratory effort. Abdomen: soft, ntnd Psychiatric: grossly normal mood and affect, speech fluent and appropriate  New data  reviewed:  Troponin now normal   CBG 200-300  Pertinent data since admission:  CT head IMPRESSION: Negative for acute intracranial traumatic injury. There is generalized atrophy and chronic small vessel ischemic disease.  CXR IMPRESSION: Unchanged cardiomegaly. No acute findings.  EKG Afi RVR, ventricular rate 145, nonspecific ST changes, prolonged QT  Pending data:  UC  Scheduled Meds: . citalopram  20 mg Oral Daily  . digoxin  0.125 mg Oral Daily  . insulin aspart  0-5 Units Subcutaneous QHS  . insulin aspart  0-9 Units Subcutaneous TID WC  . magnesium oxide  400 mg Oral Daily  . metoprolol  100 mg Oral BID  . pravastatin  40 mg Oral QPM  . rivaroxaban  20 mg Oral Daily   Continuous Infusions:    Principal Problem:   Atrial fibrillation with rapid ventricular response Active Problems:   Hypertension   Diabetes mellitus, type 2   Chronic anticoagulation   Hyperglycemia   Acute encephalopathy   Chest pain   Time spent: 20 minutes   I, Jessica D. Leonie Green, acting as scribe, recorded this note contemporaneously in the presence of Dr. Melene Plan. Sarajane Jews, M.D. on 01/01/2015 .   I have reviewed the above documentation for accuracy and completeness, and I agree with the above. [name], MD

## 2015-01-01 NOTE — Clinical Social Work Placement (Signed)
   CLINICAL SOCIAL WORK PLACEMENT  NOTE  Date:  01/01/2015  Patient Details  Name: Christine [name] MRN: [id number] Date of Birth: [birth year]  Clinical Social Work is seeking post-discharge placement for this patient at the [place] level of care (*CSW will initial, date and re-position this form in  chart as items are completed):  Yes   Patient/family provided with [place] Work Department's list of facilities offering this level of care within the geographic area requested by the patient (or if unable, by the patient's family).  Yes   Patient/family informed of their freedom to choose among providers that offer the needed level of care, that participate in Medicare, Medicaid or managed care program needed by the patient, have an available bed and are willing to accept the patient.  Yes   Patient/family informed of [place]'s ownership interest in [facility] and [facility], as well as of the fact that they are under no obligation to receive care at these facilities.  PASRR submitted to EDS on 12/31/14     PASRR number received on 12/31/14     Existing PASRR number confirmed on       FL2 transmitted to all facilities in geographic area requested by pt/family on 01/01/15     FL2 transmitted to all facilities within larger geographic area on       Patient informed that his/her managed care company has contracts with or will negotiate with certain facilities, including the following:        Yes   Patient/family informed of bed offers received.  Patient chooses bed at [facility]     Physician recommends and patient chooses bed at      Patient to be transferred to  [facility]) on  .  Patient to be transferred to facility by       Patient family notified on   of transfer.  Name of family member notified:        PHYSICIAN       Additional Comment:    _______________________________________________ Ihor Gully, LCSW 01/01/2015, 11:30 AM [phone]

## 2015-01-01 NOTE — Clinical Social Work Placement (Signed)
   CLINICAL SOCIAL WORK PLACEMENT  NOTE  Date:  01/01/2015  Patient Details  Name: Christine [name] MRN: [id number] Date of Birth: 1940-10-01  Clinical Social Work is seeking post-discharge placement for this patient at the [place] level of care (*CSW will initial, date and re-position this form in  chart as items are completed):  Yes   Patient/family provided with [place] Work Department's list of facilities offering this level of care within the geographic area requested by the patient (or if unable, by the patient's family).  Yes   Patient/family informed of their freedom to choose among providers that offer the needed level of care, that participate in Medicare, Medicaid or managed care program needed by the patient, have an available bed and are willing to accept the patient.  Yes   Patient/family informed of [place]'s ownership interest in [facility] and [facility], as well as of the fact that they are under no obligation to receive care at these facilities.  PASRR submitted to EDS on 12/31/14     PASRR number received on 12/31/14     Existing PASRR number confirmed on       FL2 transmitted to all facilities in geographic area requested by pt/family on 01/01/15     FL2 transmitted to all facilities within larger geographic area on       Patient informed that his/her managed care company has contracts with or will negotiate with certain facilities, including the following:            Patient/family informed of bed offers received.  Patient chooses bed at       Physician recommends and patient chooses bed at      Patient to be transferred to   on  .  Patient to be transferred to facility by       Patient family notified on   of transfer.  Name of family member notified:        PHYSICIAN       Additional Comment: Patient's daughter, [name], is currently unsure as to whether she would like to choose home health with PT  or SNF.     _______________________________________________ [place], LCSW 01/01/2015, 10:54 AM [phone]

## 2015-01-02 ENCOUNTER — Inpatient Hospital Stay
Admission: RE | Admit: 2015-01-02 | Discharge: 2015-02-07 | Disposition: A | Discharge status: Home / Self Care | Payer: Medicare Other | Source: Ambulatory Visit | Attending: Internal Medicine | Admitting: Internal Medicine

## 2015-01-02 DIAGNOSIS — G9340 Encephalopathy, unspecified: Secondary | ICD-10-CM | POA: Diagnosis not present

## 2015-01-02 DIAGNOSIS — Z7901 Long term (current) use of anticoagulants: Secondary | ICD-10-CM | POA: Diagnosis not present

## 2015-01-02 DIAGNOSIS — R05 Cough: Secondary | ICD-10-CM

## 2015-01-02 DIAGNOSIS — R0789 Other chest pain: Secondary | ICD-10-CM | POA: Diagnosis not present

## 2015-01-02 DIAGNOSIS — I4891 Unspecified atrial fibrillation: Secondary | ICD-10-CM | POA: Diagnosis not present

## 2015-01-02 DIAGNOSIS — R059 Cough, unspecified: Secondary | ICD-10-CM

## 2015-01-02 DIAGNOSIS — I5032 Chronic diastolic (congestive) heart failure: Secondary | ICD-10-CM

## 2015-01-02 DIAGNOSIS — R52 Pain, unspecified: Principal | ICD-10-CM

## 2015-01-02 LAB — GLUCOSE, CAPILLARY
Glucose-Capillary: 247 mg/dL — ABNORMAL HIGH (ref 65–99)
Glucose-Capillary: 400 mg/dL — ABNORMAL HIGH (ref 65–99)

## 2015-01-02 LAB — HEMOGLOBIN A1C
Hgb A1c MFr Bld: 14 % — ABNORMAL HIGH (ref 4.8–5.6)
MEAN PLASMA GLUCOSE: 355 mg/dL

## 2015-01-02 MED ORDER — RIVAROXABAN 20 MG PO TABS
ORAL_TABLET | ORAL | Status: DC
Start: 2015-01-02 — End: 2015-02-10

## 2015-01-02 MED ORDER — METOPROLOL TARTRATE 100 MG PO TABS
100.0000 mg | ORAL_TABLET | Freq: Two times a day (BID) | ORAL | Status: DC
Start: 2015-01-02 — End: 2015-06-30

## 2015-01-02 MED ORDER — INSULIN DETEMIR 100 UNIT/ML ~~LOC~~ SOLN: 10.0000 [IU] | Freq: Every day | SUBCUTANEOUS | Status: DC

## 2015-01-02 MED ORDER — INSULIN DETEMIR 100 UNIT/ML ~~LOC~~ SOLN
10.0000 [IU] | Freq: Every day | SUBCUTANEOUS | Status: DC
  Filled 2015-01-02 (×3): qty 0.1

## 2015-01-02 MED ORDER — METOPROLOL TARTRATE 50 MG PO TABS
100.0000 mg | ORAL_TABLET | Freq: Two times a day (BID) | ORAL | Status: DC
  Administered 2015-01-02: 100 mg via ORAL
  Filled 2015-01-02: qty 2

## 2015-01-02 MED ORDER — LIVING WELL WITH DIABETES BOOK
Freq: Once | Status: AC
  Administered 2015-01-02: 12:00:00
  Filled 2015-01-02: qty 1

## 2015-01-02 MED ORDER — MAGNESIUM OXIDE 400 (241.3 MG) MG PO TABS
400.0000 mg | ORAL_TABLET | Freq: Every day | ORAL | Status: DC
Start: 2015-01-02 — End: 2015-02-10

## 2015-01-02 MED ORDER — INSULIN DETEMIR 100 UNIT/ML ~~LOC~~ SOLN: 5.0000 [IU] | Freq: Every day | SUBCUTANEOUS | Status: DC

## 2015-01-02 MED ORDER — INSULIN ASPART 100 UNIT/ML ~~LOC~~ SOLN: 0.0000 [IU] | Freq: Three times a day (TID) | SUBCUTANEOUS | Status: DC

## 2015-01-02 NOTE — Care Management Note (Signed)
Case Management Note  Patient Details  Name: LEVERNE TESSLER MRN: [id number] Date of Birth: 1940-08-13  Expected Discharge Date:                  Expected Discharge Plan:  [place]  In-House Referral:  NA, Clinical Social Work  Discharge planning Services  CM Consult  Post Acute Care Choice:  NA Choice offered to:  NA  DME Arranged:    DME Agency:     HH Arranged:    [place] Agency:     Status of Service:  Completed, signed off  Medicare Important Message Given:    Date Medicare IM Given:    Medicare IM give by:    Date Additional Medicare IM Given:    Additional Medicare Important Message give by:     If discussed at [place] of Stay Meetings, dates discussed:    Additional Comments: PT made SNF recommendation. CSW saw pt and daughter and arranged for placement. Pt discharged today to [facility] SNF. Pt and daughter agreeable with DC plan. No CM needs at DC.  Sherald Barge, RN 01/02/2015, 3:22 PM

## 2015-01-02 NOTE — Progress Notes (Signed)
Patient has been awake for 2 days and verbalizes seeing snakes in the room.  MD notified.  Orders received.

## 2015-01-02 NOTE — Discharge Summary (Addendum)
Physician Discharge Summary  Christine [name] VQQ:[id number] DOB: [birth year] DOA: 12/31/2014  PCP: Celedonio Savage, MD  Admit date: 12/31/2014 Discharge date: 01/02/2015  Recommendations for Outpatient Follow-up:   Follow-up atrial fibrillation, beta blocker increased.  Follow-up uncontrolled DM with Hgb A1c 14. Started on insulin. Consider oral therpay.  Follow up with PCP  Follow-up Information    Follow up with Celedonio Savage, MD. Schedule an appointment as soon as possible for a visit in 1 month.   Specialty:  Family Medicine   Contact information:   [address] [place] [address] [phone]         Discharge Diagnoses:  1. Atrial fibrillation with RVR 2. Demand ischemia secondary to rapid heart rate 3. Acute encephalopathy superimposed on dementia, likely metabolic in nature 4. Dehydration with hyponatremia and hypochloremia 5. Prolonged QTc 6. DM Type 2 7. Chronic diastolic CHF 8. Dementia   Discharge Condition: Improved  Disposition: Discharge home   Diet recommendation: Regular   Filed Weights   12/31/14 0035 12/31/14 0647  Weight: 78.019 kg (172 lb) 83.7 kg (184 lb 8.4 oz)    History of present illness:  73yow presented to ED via EMS with vague history. EMS was called for possible fall, found patient confused, CBG 426. In ED found to have afib with RVR and stared on Cardizem. Admitted for acute encephalopathy, afib RVR, elevated troponin.  Hospital Course:  Admitted to SDU, treated with IV diltiazem with subsequent rate control, changed to oral therapy. Atrial fibrillation currently rate controlled upon discharge will continue digoxin, metoprolol, Xarelto as an outpatient. During hospitalization troponins remainded flat and per cardiology no further evaluation was needed for demand ischemia which was thought to be seconday to rapid HR. Acute encephalopathy resolved and remains at baseline with improvement. CBG improved on long acting insulin. All other issues  remained stable during hospitalization.   Individual issues as below:   Atrial fibrillation with RVR, rate now controlled.Secondary to noncompliance (not intentional) on digoxin, metoprolol, Xarelto as an outpatient.   Demand ischemia secondary to rapid HR. Troponins flat. No further evaluation per cardiology.  Acute encephalopathy, resolved. Back to baseline. Has underlying dementia.   Dehydration with hyponatremia and hypochloremia.  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 Associated hyperglycemia on admission. Hgb A1c 14. Continue on Levemir. CBG remains elevated at 247 today.  Prolonged QTc.   Dementia, on Aricept at home per daughter. Forgets to take meds at times. Daughter will check on daily and more closely supervise meds as an outpatient. No indication for neurologic workup.  Consultants:  Cardiology  Procedures:  Echo Left ventricle: The cavity size was normal. Wall thickness was increased in a pattern of mild LVH. Systolic function was normal. The estimated ejection fraction was in the range of 55% to 60%. Wall motion was normal; there were no regional wall motion abnormalities. The study was not technically sufficient to allow evaluation of LV diastolic dysfunction due to atrial fibrillation. - Aortic valve: Mildly calcified annulus. Trileaflet; mildly thickened leaflets. Valve area (VTI): 2.03 cm^2. Valve area (Vmax): 2.24 cm^2. - Mitral valve: Mildly calcified annulus. Mildly thickened leaflets . There was mild regurgitation. - Left atrium: The atrium was severely dilated. - Right atrium: The atrium was severely dilated. - Pulmonary arteries: Systolic pressure was moderately increased. PA peak pressure: 46 mm Hg (S). - Technically adequate study.  Antibiotics: none   Discharge Instructions     Current Discharge Medication List  START taking these medications    Details  insulin aspart (NOVOLOG) 100 UNIT/ML injection Inject 0-15 Units into the skin 3 (three) times daily with meals. CBG < 70: implement hypoglycemia protocol CBG 70 - 120: 0 units CBG 121 - 150: 2 units CBG 151 - 200: 3 units CBG 201 - 250: 5 units CBG 251 - 300: 8 units CBG 301 - 350: 11 units CBG 351 - 400: 15 units CBG > 400: call MD and obtain STAT lab verification    insulin detemir (LEVEMIR) 100 UNIT/ML injection Inject 0.1 mLs (10 Units total) into the skin daily.    magnesium oxide (MAG-OX) 400 (241.3 MG) MG tablet Take 1 tablet (400 mg total) by mouth daily.      CONTINUE these medications which have CHANGED   Details  metoprolol (LOPRESSOR) 100 MG tablet Take 1 tablet (100 mg total) by mouth 2 (two) times daily.    rivaroxaban (XARELTO) 20 MG TABS tablet TAKE (1) TABLET BY MOUTH ONCE DAILY.      CONTINUE these medications which have NOT CHANGED   Details  cetirizine (ZYRTEC) 10 MG tablet Take 10 mg by mouth daily.    cholecalciferol (VITAMIN D) 1000 UNITS tablet Take 1,000 Units by mouth daily.    citalopram (CELEXA) 20 MG tablet Take 20 mg by mouth daily.    digoxin (LANOXIN) 0.125 MG tablet TAKE 1 TABLET ONCE DAILY. Qty: 30 tablet, Refills: 6    meclizine (ANTIVERT) 12.5 MG tablet Take 12.5 mg by mouth 3 (three) times daily as needed. For dizziness    Multiple Vitamin (MULTIVITAMIN WITH MINERALS) TABS Take 1 tablet by mouth every morning.    potassium chloride (K-DUR) 10 MEQ tablet TAKE 4 TABLETS ONCE DAILY. Qty: 120 tablet, Refills: 6    pravastatin (PRAVACHOL) 40 MG tablet Take 1 tablet (40 mg total) by mouth every evening. Qty: 30 tablet, Refills: 6      STOP taking these medications     furosemide (LASIX) 40 MG tablet        Allergies  Allergen Reactions  . Codeine Anaphylaxis and Hives  . Morphine And Related Anaphylaxis and Hives  . Penicillins Anaphylaxis  . Ace Inhibitors Cough    The results of significant diagnostics from this  hospitalization (including imaging, microbiology, ancillary and laboratory) are listed below for reference.    Significant Diagnostic Studies: Ct Head Wo Contrast  12/31/2014   CLINICAL DATA:  [place].  Hyperglycemia.  EXAM: CT HEAD WITHOUT CONTRAST  TECHNIQUE: Contiguous axial images were obtained from the base of the skull through the vertex without intravenous contrast.  COMPARISON:  None.  FINDINGS: There is no intracranial hemorrhage or extra-axial fluid collection. There is moderate generalized atrophy. There is mild white matter hypodensity consistent with chronic small vessel disease. No focal brain abnormality is evident. The calvarium and skullbase are intact.  IMPRESSION: Negative for acute intracranial traumatic injury. There is generalized atrophy and chronic small vessel ischemic disease.   Electronically Signed   By: Andreas Newport M.D.   On: 12/31/2014 03:49   Dg Chest Port 1 View  12/31/2014   CLINICAL DATA:  [place].  Confused.  Hyperglycemia.  EXAM: PORTABLE CHEST - 1 VIEW  COMPARISON:  11/07/2011  FINDINGS: There is unchanged cardiomegaly and aortic tortuosity. The lungs are clear. The pulmonary vasculature is normal. There is no large effusion.  IMPRESSION: Unchanged cardiomegaly.  No acute findings.   Electronically Signed   By: Andreas Newport M.D.   On: 12/31/2014 03:17  Microbiology: Recent Results (from the past 240 hour(s))  Urine culture     Status: None   Collection Time: 12/31/14  3:03 AM  Result Value Ref Range Status   Specimen Description URINE, CATHETERIZED  Final   Special Requests NONE  Final   Culture   Final    NO GROWTH 1 DAY Performed at [facility]    Report Status 01/01/2015 FINAL  Final  MRSA PCR Screening     Status: None   Collection Time: 12/31/14  8:30 PM  Result Value Ref Range Status   MRSA by PCR NEGATIVE NEGATIVE Final    Comment:        The GeneXpert MRSA Assay (FDA approved for NASAL specimens only), is one component of  a comprehensive MRSA colonization surveillance program. It is not intended to diagnose MRSA infection nor to guide or monitor treatment for MRSA infections.      Labs: Basic Metabolic Panel:  Recent Labs Lab 12/31/14 0202 12/31/14 0207  NA 132*  --   K 3.8  --   CL 99*  --   CO2 22  --   GLUCOSE 459*  --   BUN 13  --   CREATININE 1.03*  --   CALCIUM 8.8*  --   MG  --  1.5*   Liver Function Tests:  Recent Labs Lab 12/31/14 0202  AST 20  ALT 16  ALKPHOS 89  BILITOT 1.0  PROT 7.6  ALBUMIN 3.6  CBC:  Recent Labs Lab 12/31/14 0202  WBC 12.1*  NEUTROABS 9.9*  HGB 14.3  HCT 41.9  MCV 88.6  PLT 144*   Cardiac Enzymes:  Recent Labs Lab 12/31/14 0202 12/31/14 0656 12/31/14 1213 12/31/14 1842  TROPONINI 0.05* 0.05* 0.04* 0.03   CBG:  Recent Labs Lab 12/31/14 2104 01/01/15 0757 01/01/15 1226 01/01/15 1627 01/01/15 2134  GLUCAP 287* 296* 422* 93 284*    Principal Problem:   Atrial fibrillation with rapid ventricular response Active Problems:   Hypertension   Diabetes mellitus, type 2   Chronic anticoagulation   Hyperglycemia   Acute encephalopathy   Chest pain   Time coordinating discharge: 35 minutes   Signed:  [name], MD [facility] 01/02/2015, 7:00 AM   I, Rhett Bannister, acting a scribe, recorded this note contemporaneously in the presence of Dr. Kathie Dike, M.D. on 01/02/2015 at 7:01 AM  I have reviewed the above documentation for accuracy and completeness, and I agree with the above. [name], MD

## 2015-01-02 NOTE — Progress Notes (Signed)
Patient ID: AMEIRA ALESSANDRINI, female   DOB: 1940-07-14, 74 y.o.   MRN: [id number]    Subjective:    No complaints  Objective:   Temp:  [97.7 F (36.5 C)-98.9 F (37.2 C)] 97.7 F (36.5 C) (09/02 0500) Pulse Rate:  [86-119] 86 (09/02 0500) Resp:  [16-18] 18 (09/02 0500) BP: (109-123)/(65-82) 111/65 mmHg (09/02 0500) SpO2:  [95 %-100 %] 100 % (09/02 0500)    Filed Weights   12/31/14 0035 12/31/14 0647  Weight: 172 lb (78.019 kg) 184 lb 8.4 oz (83.7 kg)    Intake/Output Summary (Last 24 hours) at 01/02/15 0858 Last data filed at 01/01/15 1900  Gross per 24 hour  Intake    240 ml  Output    400 ml  Net   -160 ml    Telemetry: afib rate 80  Exam:  General: NAD  HEENT: sclera clear  Resp: CTAB  Cardiac: irreg, no m/r/g, no jvd  VX:BLTJQZE soft, NT, ND  MSK: no LE edema  Neuro: no focal deficits  Psych: appropriate affect  Lab Results:  Basic Metabolic Panel:  Recent Labs Lab 12/31/14 0202 12/31/14 0207  NA 132*  --   K 3.8  --   CL 99*  --   CO2 22  --   GLUCOSE 459*  --   BUN 13  --   CREATININE 1.03*  --   CALCIUM 8.8*  --   MG  --  1.5*    Liver Function Tests:  Recent Labs Lab 12/31/14 0202  AST 20  ALT 16  ALKPHOS 89  BILITOT 1.0  PROT 7.6  ALBUMIN 3.6    CBC:  Recent Labs Lab 12/31/14 0202  WBC 12.1*  HGB 14.3  HCT 41.9  MCV 88.6  PLT 144*    Cardiac Enzymes:  Recent Labs Lab 12/31/14 0656 12/31/14 1213 12/31/14 1842  TROPONINI 0.05* 0.04* 0.03    BNP: No results for input(s): PROBNP in the last 8760 hours.  Coagulation: No results for input(s): INR in the last 168 hours.  ECG:   Medications:   Scheduled Medications: . citalopram  20 mg Oral Daily  . digoxin  0.125 mg Oral Daily  . insulin aspart  0-15 Units Subcutaneous TID WC  . insulin aspart  0-5 Units Subcutaneous QHS  . insulin detemir  5 Units Subcutaneous Daily  . magnesium oxide  400 mg Oral Daily  . metoprolol  100 mg Oral BID  .  pravastatin  40 mg Oral QPM  . rivaroxaban  20 mg Oral Daily     Infusions:     PRN Medications:  acetaminophen **OR** acetaminophen, meclizine, ondansetron **OR** ondansetron (ZOFRAN) IV, zolpidem     Assessment/Plan    1. Afib - rates elevated most of yesterday. Her lopressor was increased to 100mg  bid and her digoxin was restarted. Rates this AM in 80s. She remains on xarelto - continue current meds  2. Elevated troponin - peak 0.05 in setting of afib with RVR that trended down with rate control. Chest pain with palpitations on admission that has also resolved with rate control. Echo normal LVEF, no WMAs - no ischemic workup indicated at this time    [facility] for discharge today. Needs cardaic f/u 2 weeks.  Will sign off inpatient care.     Carlyle Dolly, M.D.

## 2015-01-02 NOTE — Progress Notes (Signed)
Inpatient Diabetes Program Recommendations  AACE/ADA: New Consensus Statement on Inpatient Glycemic Control (2013)  Target Ranges:  Prepandial:   less than 140 mg/dL      Peak postprandial:   less than 180 mg/dL (1-2 hours)      Critically ill patients:  140 - 180 mg/dL   Results for Christine [name], Christine [name] (MRN [id number]) as of 01/02/2015 08:07  Ref. Range 01/01/2015 07:57 01/01/2015 12:26 01/01/2015 16:27 01/01/2015 21:34 01/02/2015 07:43  Glucose-Capillary Latest Ref Range: 65-99 mg/dL 296 (H) 422 (H) 93 284 (H) 247 (H)   Results for Christine [name], Christine [name] (MRN [id number]) as of 01/02/2015 08:07  Ref. Range 12/31/2014 01:50  Hemoglobin A1C Latest Ref Range: 4.8-5.6 % 14.0 (H)   Diabetes history: DM2 Outpatient Diabetes medications: None Current orders for Inpatient glycemic control: Levemir 5 units daily, Novolog 0-15 units TID with meals, Novolog 0-5 units HS  Inpatient Diabetes Program Recommendations Insulin - Basal: Please consider increasing Levemir to 10 units daily. Outpatient DM medications: Glucose has ranged from 93-422 mg/dl over the past 24 hours. Fasting glucose is 247 mg/dl this morning. There are not any DM medications listed on home medication list. Patient will need to be prescribed DM medication at time of discharge for glycemic control. HgbA1C: A1C 14.0% on 12/31/14.  Thanks, Barnie Alderman, RN, MSN, CCRN, CDE Diabetes Coordinator Inpatient Diabetes Program [phone] (Team Pager from [place] to [place]) [phone] ([place] office) [phone] [facility] office) [phone] [facility] office)

## 2015-01-02 NOTE — Progress Notes (Signed)
Spoke with patient and her daughter about diabetes and home regimen for diabetes control. Patient reports that she has not followed up with her doctor regarding diabetes management "in a very long time". Patient reports that she has always been able to keep her diabetes controlled with her diet. However, she has not been following any type of diet. Patient's daughter states that she has been trying to help her mother with everything from buying groceries to putting her medications in a pill box for the last 8 months. Patient's daughter states that she has "been buying regular cokes, cakes, and cookies for my mother because I did not realize that her diabetes had gotten worse."  Patient does not check her glucose and does not recall any glucose or A1C values she was told by her doctor in the past.  Inquired about knowledge about A1C and patient reports that she does not know what an A1C is. Discussed A1C results (14.0% on 12/31/14) and explained what an A1C is, basic pathophysiology of DM Type 2, basic home care, importance of checking CBGs and maintaining good CBG control to prevent long-term and short-term complications. Discussed impact of nutrition, exercise, stress, sickness, and medications on diabetes control.   Discussed carbohydrates, carbohydrate goals per day and meal, along with portion sizes. Patient's daughter states that the RD is coming to talk with her shortly. Provided handout information about daily diabetes meal planning guide and on the free diabetes education class taught here at [facility]. Encouraged patient's daughter to attend the free diabetes education class. Also ordered Living Well With Diabetes booklet for patient's daughter to review for more education on diabetes. Explained current glucose values since being admitted and informed patient and her daughter that it appears that she needs insulin for glycemic control. Patient's daughter would like to give her mother the opportunity to use  oral DM medications along with a carbohydrate modified diet to see if she can get control of there diabetes. Explained that with the current A1C of 14% and with glucose up to 422 mg/dl yesterday (on a carb modified diet) patient likely needs insulin. Patient's daughter states that she will have to be the one to give her mother insulin injections after she goes home from the [facility] if she is discharged on insulin and she prefers to try oral DM medications first. Informed patient I would make note of her preference but the doctor would be the one to make the decision about outpatient diabetes medications that would be the best for the patient to get her diabetes under control.   Patient and her daughter verbalized understanding of information discussed and she states that she has no further questions at this time related to diabetes.   Thanks, Barnie Alderman, RN, MSN, CCRN, CDE Diabetes Coordinator Inpatient Diabetes Program [phone] (Team Pager) [phone] ([place] office) [phone] [facility] office) [phone] [facility] office)

## 2015-01-02 NOTE — Consult Note (Signed)
    CARDIOLOGY CONSULT NOTE   Note entered in error. Please see previous cardiology consult note.

## 2015-01-02 NOTE — Plan of Care (Signed)
Problem: Food- and Nutrition-Related Knowledge Deficit (NB-1.1) Goal: Nutrition education Formal process to instruct or train a patient/client in a skill or to impart knowledge to help patients/clients voluntarily manage or modify food choices and eating behavior to maintain or improve health. Outcome: Completed/Met Date Met:  01/02/15  RD asked by pt's daughter for education regarding diabetes.     Lab Results  Component Value Date    HGBA1C 14.0* 12/31/2014    RD provided "Type 2 Diabetes Nutrition Therapy" handout from the Academy of Nutrition and Dietetics. Also provided the "My plate template".    Dietary Recall: Breakfast: Cereal (Frosted flakes), banana Lunch: Sandwich-chicken salad, macaroni salad, PBand J Dinner: Sandwiches or a meal that is provided by daughter HS snack: PB crackers or ice cream Beverages: Coke (2-3 a day) and water  Pt's Daughter has very grass roots understanding of diabetic diet.Started by discussing all the different food groups and their effects on blood sugar, emphasizing carbohydrate-containing foods. We went over the plate method as a tool that can be used construct meals with appropriate food group ratios. Provided examples of ways to balance meals/snacks, specifically breakfast. Reccommended trying oatmeal, eggs, protein shake with a smaller portion of cereal/fruit. Up until this point pt had been eating white bread- encouraged intake of high-fiber, whole grain complex carbohydrates.  Daughter provides all foods for patient and she stated she will be switching to Coke Zero instead of the regular. This by itself should help immensely.   We went over carbohydrate counting. Provided list of carbohydrates and recommended serving sizes of common foods. Recommend eating 3-5 servings (45-75g) of carbs at her meals and 1-2 servings (15-30g) of carbs ar her snacks.   Expec tExcellent compliance. Pt's daughter is highly motivated to make changes for the patient and  seemed to understand all information well.   Body mass index is 29.8 kg/(m^2). Pt meets criteria for overweight based on current BMI.  Current diet order is HH/Carb modified, patient is consuming approximately 75% of meals at this time. Labs and medications reviewed. No further nutrition interventions warranted at this time. RD contact information provided. Burtis Junes RD, LDN Nutrition Pager: [phone] 01/02/2015 12:23 PM

## 2015-01-02 NOTE — Progress Notes (Signed)
Pt is to be discharged to the [facility] today. Pt is in NAD, IV is out, all paperwork has been reviewed/discussed with patient, and there are no questions/concerns at this time. Assessment is unchanged from this morning. Pt is to be accompanied downstairs by staff via wheelchair.

## 2015-01-02 NOTE — Clinical Social Work Placement (Signed)
   CLINICAL SOCIAL WORK PLACEMENT  NOTE  Date:  01/02/2015  Patient Details  Name: Christine [name] MRN: [id number] Date of Birth: [birth year]  Clinical Social Work is seeking post-discharge placement for this patient at the [place] level of care (*CSW will initial, date and re-position this form in  chart as items are completed):  Yes   Patient/family provided with [facility] Work Department's list of facilities offering this level of care within the geographic area requested by the patient (or if unable, by the patient's family).  Yes   Patient/family informed of their freedom to choose among providers that offer the needed level of care, that participate in Medicare, Medicaid or managed care program needed by the patient, have an available bed and are willing to accept the patient.  Yes   Patient/family informed of [facility] ownership interest in [facility] and [facility], as well as of the fact that they are under no obligation to receive care at these facilities.  PASRR submitted to EDS on 12/31/14     PASRR number received on 12/31/14     Existing PASRR number confirmed on       FL2 transmitted to all facilities in geographic area requested by pt/family on 01/01/15     FL2 transmitted to all facilities within larger geographic area on       Patient informed that his/her managed care company has contracts with or will negotiate with certain facilities, including the following:        Yes   Patient/family informed of bed offers received.  Patient chooses bed at [facility]     Physician recommends and patient chooses bed at      Patient to be transferred to  [facility]) on 01/02/15.  Patient to be transferred to facility by  [facility] hospital staff)     Patient family notified on 01/02/15 of transfer.  Name of family member notified:  [name], daughter     PHYSICIAN       Additional Comment: CSW signing off.      _______________________________________________ Ambrose Pancoast D, LCSW 01/02/2015, 10:15 AM

## 2015-01-02 NOTE — Progress Notes (Signed)
PROGRESS NOTE  Christine [name] OEV:[id number] DOB: 06/20/1940 DOA: 12/31/2014 PCP: Celedonio Savage, MD Dr. Bronson Ing   Summary: 29yow presented to ED via EMS with vague history. EMS was called for possible fall, found patient confused, CBG 426. In ED found to have afib with RVR and started on Cardizem. Admitted for acute encephalopathy, afib RVR, elevated troponin.  Assessment/Plan: 1. Atrial fibrillation with RVR, rate now controlled.Secondary to noncompliance (not intentional) on digoxin, metoprolol, Xarelto as an outpatient.  2. Demand ischemia secondary to rapid HR. Troponins flat. No further evaluation per cardiology. 3. Acute encephalopathy, resolved. Back to baseline. Has underlying dementia.  4. Suspected dehydration with hyponatremia and hypochloremia. 5.  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 Associated hyperglycemia on admission. Hgb A1c 14. Continue on Levemir. CBG remains elevated at 247 today. 6. Prolonged QTc.  7. Dementia, on Aricept at home per daughter. Forgets to take meds at times. Daughter will check on daily and more closely supervise meds as an outpatient. No indication for neurologic workup.   Continues to improve, rate controlled, continue beta blocker and digoxin.  Continue Levemir  Transfer to SNF today.  [name], MD  [facility]  Pager [phone] If 7PM-7AM, please contact night-coverage at [email or web address], password [email or web address] 01/02/2015, 7:18 AM  LOS: 2 days   Consultants:  Cardiology  Procedures:  Echo  Left ventricle: The cavity size was normal. Wall thickness was increased in a pattern of mild LVH. Systolic function was normal. The estimated ejection fraction was in the range of 55% to 60%. Wall motion was normal; there were no regional wall motion abnormalities. The study was not technically sufficient to allow evaluation of LV diastolic dysfunction due to  atrial fibrillation. - Aortic valve: Mildly calcified annulus. Trileaflet; mildly thickened leaflets. Valve area (VTI): 2.03 cm^2. Valve area (Vmax): 2.24 cm^2. - Mitral valve: Mildly calcified annulus. Mildly thickened leaflets . There was mild regurgitation. - Left atrium: The atrium was severely dilated. - Right atrium: The atrium was severely dilated. - Pulmonary arteries: Systolic pressure was moderately increased. PA peak pressure: 46 mm Hg (S). - Technically adequate study.  Antibiotics:  Levaquin 8/31>>8/31  HPI/Subjective: Feeling better. Denies pain or heart flutters.  Objective: Filed Vitals:   01/01/15 1121 01/01/15 1344 01/01/15 2119 01/02/15 0500  BP:  123/72 116/82 111/65  Pulse: 92 111 119 86  Temp:  98.1 F (36.7 C) 98.9 F (37.2 C) 97.7 F (36.5 C)  TempSrc:   Oral Oral  Resp:  18 16 18   Height:      Weight:      SpO2:  96% 95% 100%    Intake/Output Summary (Last 24 hours) at 01/02/15 0718 Last data filed at 01/01/15 1900  Gross per 24 hour  Intake    240 ml  Output    400 ml  Net   -160 ml     Filed Weights   12/31/14 0035 12/31/14 0647  Weight: 78.019 kg (172 lb) 83.7 kg (184 lb 8.4 oz)    Exam:  General:  Appears calm and comfortable Eyes: PERRL, normal lids, irises & conjunctiva. Wears glasses. ENT: grossly normal hearing, lips & tongue Cardiovascular: Normal rate, irregular rhythm, no m/r/g. 1+Bilateral Lower Extremity edema Telemetry: atrial fibrillation, HR 80s Respiratory: CTA bilaterally, no w/r/r. Normal respiratory effort. Abdomen: soft, ntnd Psychiatric: grossly normal mood and affect, speech fluent and appropriate Neurologic: Oriented place, self  New data reviewed:  CBG improved, less than 300.  Pertinent  data since admission:  CT head IMPRESSION: Negative for acute intracranial traumatic injury. There is generalized atrophy and chronic small vessel ischemic disease.  CXR IMPRESSION: Unchanged  cardiomegaly. No acute findings.  EKG Afi RVR, ventricular rate 145, nonspecific ST changes, prolonged QT  Pending data:    Scheduled Meds: . citalopram  20 mg Oral Daily  . digoxin  0.125 mg Oral Daily  . insulin aspart  0-15 Units Subcutaneous TID WC  . insulin aspart  0-5 Units Subcutaneous QHS  . insulin detemir  5 Units Subcutaneous Daily  . magnesium oxide  400 mg Oral Daily  . metoprolol  100 mg Oral BID  . pravastatin  40 mg Oral QPM  . rivaroxaban  20 mg Oral Daily   Continuous Infusions:    Principal Problem:   Atrial fibrillation with rapid ventricular response Active Problems:   Hypertension   Diabetes mellitus, type 2   Chronic anticoagulation   Hyperglycemia   Acute encephalopathy   Chest pain   I, Rhett Bannister, acting a scribe, recorded this note contemporaneously in the presence of Dr. Kathie Dike, M.D. on 01/02/2015 at 7:19 AM    I have reviewed the above documentation for accuracy and completeness, and I agree with the above. [name], MD

## 2015-01-07 ENCOUNTER — Non-Acute Institutional Stay (SKILLED_NURSING_FACILITY): Payer: Medicare Other | Admitting: Internal Medicine

## 2015-01-07 DIAGNOSIS — E0842 Diabetes mellitus due to underlying condition with diabetic polyneuropathy: Secondary | ICD-10-CM | POA: Diagnosis not present

## 2015-01-07 DIAGNOSIS — E0851 Diabetes mellitus due to underlying condition with diabetic peripheral angiopathy without gangrene: Secondary | ICD-10-CM

## 2015-01-07 DIAGNOSIS — G309 Alzheimer's disease, unspecified: Secondary | ICD-10-CM

## 2015-01-07 DIAGNOSIS — F0280 Dementia in other diseases classified elsewhere without behavioral disturbance: Secondary | ICD-10-CM

## 2015-01-07 DIAGNOSIS — I482 Chronic atrial fibrillation, unspecified: Secondary | ICD-10-CM

## 2015-01-07 NOTE — Progress Notes (Signed)
Patient ID: Christine [name], female   DOB: 27-Jan-1941, 74 y.o.   MRN: [id number]    Facility; [facility] Chief complaint; admission to SNF post admit to [facility] from 8/31 through 9/2  History; this is a patient who was admitted the after being found at home for possible falls and increasing confusion. CBG noted to be 426. The emergency room noted to have atrial fibrillation with RVR. She was started on cardiazem. After IV Carty's exam she was switched to oral therapy. Discharge medications included digoxin metopic all and neck Sarot alto. Her troponins remained flat and it was not felt that she needed any further evaluation, it was felt she had demand ischemia. Her acute encephalopathy resolved. She was started on insulin and due to a hemoglobin A1c of 14. I do not see that she was on anything PTA for her diabetes and therefore I wonder whether this was new onset diabetes  I was already asked to see this woman urgently by the nurse and the facility apparently due to tachycardia and irregularity of her pulse rate. Can't really see that she was that tachycardic, her blood pressures been stable. Maximum pulse rate I see is 100. She has no specific complaints. An urgent cardiology consult was ordered I don't think this is necessary.   CBC Latest Ref Rng 12/31/2014 11/05/2011 11/04/2011  WBC 4.0 - 10.5 K/uL 12.1(H) 5.6 7.3  Hemoglobin 12.0 - 15.0 g/dL 14.3 12.0 12.6  Hematocrit 36.0 - 46.0 % 41.9 38.3 38.9  Platelets 150 - 400 K/uL 144(L) 191 185    BMP Latest Ref Rng 12/31/2014 07/04/2012 01/31/2012  Glucose 65 - 99 mg/dL 459(H) 173(H) 192(H)  BUN 6 - 20 mg/dL 13 27(H) 25(H)  Creatinine 0.44 - 1.00 mg/dL 1.03(H) 0.90 1.05  Sodium 135 - 145 mmol/L 132(L) 141 136  Potassium 3.5 - 5.1 mmol/L 3.8 4.5 4.7  Chloride 101 - 111 mmol/L 99(L) 106 101  CO2 22 - 32 mmol/L 22 24 26   Calcium 8.9 - 10.3 mg/dL 8.8(L) 9.9 9.7   Past Medical History  Diagnosis Date  . Seasonal allergies   . Headache(784.0)    twice weekly  . Palpitations   . Hypertension   . Hyperlipidemia   . Vertigo   . Dyspnea on exertion     pedal edema  . Chronic diarrhea     diverticulosis  . Stress incontinence   . Diabetes mellitus, type 2     Diabetic neuropathy  . Bilateral lower extremity edema   . Osteopenia     DEXA scan 01/2010  . Gout   . Atrial fibrillation 08/2011    First diagnosed in 08/2011; duration of arrhythmia is uncertain  . Dementia    Past Surgical History  Procedure Laterality Date  . Cesarean section      x 2  . Appendectomy    . Umbilical hernia repair    . Knee arthroscopy w/ meniscal repair  2007    Bilateral  . Hammer toe surgery      Bilateral hammer toe amputation  . Incisional hernia repair    . Breast biopsy  2002    Right  . Colonoscopy  2007    Negative screening study   Current Outpatient Prescriptions on File Prior to Visit  Medication Sig Dispense Refill  . cetirizine (ZYRTEC) 10 MG tablet Take 10 mg by mouth daily.    . cholecalciferol (VITAMIN D) 1000 UNITS tablet Take 1,000 Units by mouth daily.    Marland Kitchen  citalopram (CELEXA) 20 MG tablet Take 20 mg by mouth daily.    . digoxin (LANOXIN) 0.125 MG tablet TAKE 1 TABLET ONCE DAILY. 30 tablet 6  . insulin aspart (NOVOLOG) 100 UNIT/ML injection Inject 0-15 Units into the skin 3 (three) times daily with meals. CBG < 70: implement hypoglycemia protocol CBG 70 - 120: 0 units CBG 121 - 150: 2 units CBG 151 - 200: 3 units CBG 201 - 250: 5 units CBG 251 - 300: 8 units CBG 301 - 350: 11 units CBG 351 - 400: 15 units CBG > 400: call MD and obtain STAT lab verification    . insulin detemir (LEVEMIR) 100 UNIT/ML injection Inject 0.1 mLs (10 Units total) into the skin daily.    . magnesium oxide (MAG-OX) 400 (241.3 MG) MG tablet Take 1 tablet (400 mg total) by mouth daily.    . meclizine (ANTIVERT) 12.5 MG tablet Take 12.5 mg by mouth 3 (three) times daily as needed. For dizziness    . metoprolol (LOPRESSOR) 100 MG tablet Take 1  tablet (100 mg total) by mouth 2 (two) times daily.    . Multiple Vitamin (MULTIVITAMIN WITH MINERALS) TABS Take 1 tablet by mouth every morning.    . potassium chloride (K-DUR) 10 MEQ tablet TAKE 4 TABLETS ONCE DAILY. 120 tablet 6  . pravastatin (PRAVACHOL) 40 MG tablet Take 1 tablet (40 mg total) by mouth every evening. 30 tablet 6  . rivaroxaban (XARELTO) 20 MG TABS tablet TAKE (1) TABLET BY MOUTH ONCE DAILY.    . [DISCONTINUED] K-DUR 20 MEQ tablet TAKE 2 TABLETS BY MOUTH ONCE DAILY. 60 tablet 5     Social: Apparently living alone in [place]. Not sure of the exact circumstances.   reports that she has never smoked. She has never used smokeless tobacco. She reports that she does not drink alcohol or use illicit drugs.  family history is not on file. She was adopted.  Review of Systems: Not possible due to extent of her dementia however she denies chest pain or shortness of breath.  Physical examination Gen. the patient is not in any distress Vitals; temperature 98.4 pulse 91 respirations 20 O2 sat is 94% blood pressure 124/67 HEENT oral exam is normal. No oropharyngeal lesions are seen Lymph; none palpable in the cervical clavicular or axilla re-areas Thyroid; not palpable Respiratory; clear entry bilaterally Cardiac heart sounds are regular no murmurs no bruits Abdomen; distended, bowel sounds are positive no liver no spleen no masses Rectal scant amount of OB negative stool GU; I felt her bladder might be distended however a bladder scan showed only a small amount of urine in her bladder. Musculoskeletal; she has significant osteoarthritis of both knees, bilateral Charcot feet which are mild Vascular; peripheral pulses are absent below the popliteal there are no open wounds Neurologic; some weakness of her right leg proximally 3/5. 4/5 hip abductor.  Gait; I did not actually test this. Nursing staff says that she does fairly well. Mental status; she can tell me her date of birth  but moderate the current year she couldn't tell me her address.  Impression/plan #1 chronic atrial fibrillation. Our nighttime man nurse raised a lot of concerns about this patient although this morning I don't see any particular issues. EKG done overnight shows minor ST-T abnormalities probably digitalis effect. There is certainly nothing that looks overtly ischemic. Heart rate is controlled with digitoxin and Lopressor 100 twice a day. 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 I am very doubtful that this patient will be able to do this on her own #6 likely moderately advanced dementia. I'll have social services do a Mini-Mental status of before approaching the family on this. #7 hyperlipidemia on Pravachol    ------------------------------------------------------------------- Indications:      Atrial fibrillation - 427.31.  ------------------------------------------------------------------- Study Conclusions  - Left ventricle: The cavity size was normal. Wall thickness was   increased in a pattern of mild LVH. Systolic function was normal.   The estimated ejection fraction was in the range of 55% to 60%.   Wall motion was normal; there were no regional wall motion   abnormalities. The study was not technically sufficient to allow   evaluation of LV diastolic dysfunction due to atrial   fibrillation. - Aortic valve: Mildly calcified annulus. Trileaflet; mildly   thickened leaflets. Valve area (VTI): 2.03 cm^2. Valve area   (Vmax): 2.24 cm^2. - Mitral valve: Mildly calcified annulus. Mildly thickened leaflets   . There was mild regurgitation. - Left atrium: The atrium was severely dilated. - Right atrium: The atrium was severely dilated. - Pulmonary arteries: Systolic pressure was moderately increased.   PA peak  pressure: 46 mm Hg (S). - Technically adequate study.  Transthoracic echocardiography.  M-mode, complete 2D, spectral Doppler, and color Doppler.  Birthdate:  Patient birthdate: [birth year].  Age:  Patient is 74 yr old.  Sex:  Gender: female. BMI: 29.7 kg/m^2.  Blood pressure:     106/66  Patient status: Inpatient.  Study date:  Study date: 12/31/2014. Study time: 03:21 PM.  Location:  ICU/CCU  -------------------------------------------------------------------  ------------------------------------------------------------------- Left ventricle:  The cavity size was normal. Wall thickness was increased in a pattern of mild LVH. Systolic function was normal. The estimated ejection fraction was in the range of 55% to 60%. Wall motion was normal; there were no regional wall motion abnormalities. The study was not technically sufficient to allow evaluation of LV diastolic dysfunction due to atrial fibrillation.   ------------------------------------------------------------------- Aortic valve:   Mildly calcified annulus. Trileaflet; mildly thickened leaflets.  Doppler:   There was no stenosis.   There was no significant regurgitation.    VTI ratio of LVOT to aortic valve: 0.59. Valve area (VTI): 2.03 cm^2. Indexed valve area (VTI): 1.02 cm^2/m^2. Peak velocity ratio of LVOT to aortic valve: 0.65. Valve area (Vmax): 2.24 cm^2. Indexed valve area (Vmax): 1.12 cm^2/m^2.  Mean gradient (S): 3 mm Hg. Peak gradient (S): 5 mm Hg.  ------------------------------------------------------------------- Aorta:  Aortic root: The aortic root was normal in size.  ------------------------------------------------------------------- Mitral valve:   Mildly calcified annulus. Mildly thickened leaflets .  Doppler:   There was no evidence for stenosis.   There was mild regurgitation.    Peak gradient (D): 3 mm Hg.  ------------------------------------------------------------------- Left atrium:  The atrium  was severely dilated.  ------------------------------------------------------------------- Right ventricle:  The cavity size was normal. Systolic function was normal.  ------------------------------------------------------------------- Pulmonic valve:   Not well visualized.  Doppler:   There was no evidence for stenosis.   There was mild regurgitation.  ------------------------------------------------------------------- Tricuspid valve:   Normal thickness leaflets.  Doppler:   There was no evidence for stenosis.   There was mild regurgitation.  ------------------------------------------------------------------- Pulmonary artery:   Systolic pressure was moderately increased.  ------------------------------------------------------------------- Right atrium:  The atrium was severely dilated.  ------------------------------------------------------------------- Pericardium:  There was no pericardial effusion.  -------------------------------------------------------------------  Systemic veins:  IVC is dilated with normal respiratory variation.

## 2015-01-12 ENCOUNTER — Non-Acute Institutional Stay (SKILLED_NURSING_FACILITY): Payer: Medicare Other | Admitting: Internal Medicine

## 2015-01-12 DIAGNOSIS — I482 Chronic atrial fibrillation, unspecified: Secondary | ICD-10-CM

## 2015-01-12 DIAGNOSIS — E0842 Diabetes mellitus due to underlying condition with diabetic polyneuropathy: Secondary | ICD-10-CM | POA: Diagnosis not present

## 2015-01-12 DIAGNOSIS — R609 Edema, unspecified: Secondary | ICD-10-CM

## 2015-01-14 ENCOUNTER — Encounter: Payer: Self-pay | Admitting: Internal Medicine

## 2015-01-14 ENCOUNTER — Encounter (HOSPITAL_COMMUNITY)
Admission: AD | Admit: 2015-01-14 | Discharge: 2015-01-14 | Disposition: A | Discharge status: Home / Self Care | Payer: Medicare Other | Source: Skilled Nursing Facility | Attending: Internal Medicine | Admitting: Internal Medicine

## 2015-01-14 LAB — BASIC METABOLIC PANEL
ANION GAP: 7 (ref 5–15)
BUN: 17 mg/dL (ref 6–20)
CHLORIDE: 105 mmol/L (ref 101–111)
CO2: 29 mmol/L (ref 22–32)
CREATININE: 0.87 mg/dL (ref 0.44–1.00)
Calcium: 9 mg/dL (ref 8.9–10.3)
GFR calc non Af Amer: >60 mL/min (ref >60)
Glucose, Bld: 78 mg/dL (ref 65–99)
Potassium: 3.9 mmol/L (ref 3.5–5.1)
Sodium: 141 mmol/L (ref 135–145)

## 2015-01-14 LAB — MAGNESIUM: MAGNESIUM: 1.9 mg/dL (ref 1.7–2.4)

## 2015-01-14 LAB — CBC WITH DIFFERENTIAL/PLATELET
BASOS PCT: 0 %
Basophils Absolute: 0 10*3/uL (ref 0.0–0.1)
EOS ABS: 0.2 10*3/uL (ref 0.0–0.7)
Eosinophils Relative: 5 %
HEMATOCRIT: 36 % (ref 36.0–46.0)
Hemoglobin: 11.9 g/dL — ABNORMAL LOW (ref 12.0–15.0)
Lymphocytes Relative: 41 %
Lymphs Abs: 2.1 10*3/uL (ref 0.7–4.0)
MCH: 30.4 pg (ref 26.0–34.0)
MCHC: 33.1 g/dL (ref 30.0–36.0)
MCV: 92.1 fL (ref 78.0–100.0)
MONO ABS: 0.5 10*3/uL (ref 0.1–1.0)
MONOS PCT: 10 %
Neutro Abs: 2.2 10*3/uL (ref 1.7–7.7)
Neutrophils Relative %: 44 %
Platelets: 250 10*3/uL (ref 150–400)
RBC: 3.91 MIL/uL (ref 3.87–5.11)
RDW: 13.6 % (ref 11.5–15.5)
WBC: 5 10*3/uL (ref 4.0–10.5)

## 2015-01-17 NOTE — Progress Notes (Addendum)
Patient ID: Christine [name], female   DOB: February 20, 1941, 74 y.o.   MRN: [id number]                PROGRESS NOTE  DATE:  01/12/2015         FACILITY: [facility]                     LEVEL OF CARE:   SNF   Acute Visit                  CHIEF COMPLAINT:  Follow up diabetes, other issues.      HISTORY OF PRESENT ILLNESS:  This is a patient whom we admitted to the building earlier this month.  She was found at home to be suffering from increasing falls and confusion.  She was found to have very poorly controlled diabetes with a hemoglobin A1c of 14, and I think  actually this may have been a new diagnosis for her.    She was also noted to be in AFib with rapid ventricular response, requiring IV Cardizem.    Since her arrival here, we have already had to increase her insulin from 10 U of Levemir to 20 U.  Even with this, her blood sugars are not well controlled, consistently above 200.    Her atrial fibrillation appears to be under better control with pulse in the 70s.      Past Medical History  Diagnosis Date  . Seasonal allergies   . Headache(784.0)     twice weekly  . Palpitations   . Hypertension   . Hyperlipidemia   . Vertigo   . Dyspnea on exertion     pedal edema  . Chronic diarrhea     diverticulosis  . Stress incontinence   . Diabetes mellitus, type 2     Diabetic neuropathy  . Bilateral lower extremity edema   . Osteopenia     DEXA scan 01/2010  . Gout   . Atrial fibrillation 08/2011    First diagnosed in 08/2011; duration of arrhythmia is uncertain  . Dementia    Past Surgical History  Procedure Laterality Date  . Cesarean section      x 2  . Appendectomy    . Umbilical hernia repair    . Knee arthroscopy w/ meniscal repair  2007    Bilateral  . Hammer toe surgery      Bilateral hammer toe amputation  . Incisional hernia repair    . Breast biopsy  2002    Right  . Colonoscopy  2007    Negative screening study    SOCIAL HISTORY:                HOUSING:  The patient apparently lived in [place] in her own home.  She has one daughter.    FUNCTIONAL STATUS:  There are apparently still plans for her to go home, although I do not see this lady as being able to go home independently.  She has moderately advanced dementia.    REVIEW OF SYSTEMS:    CHEST/RESPIRATORY:  No shortness of breath.   CARDIAC:  No chest pain.   GI:  No nausea, vomiting, or diarrhea.              PSYCHIATRIC:  Mental status:  As noted by the nursing staff, really quite a bit of confusion here.      PHYSICAL EXAMINATION:   VITAL SIGNS:  TEMPERATURE:  98.1.     PULSE:  78.    RESPIRATIONS:  20.     BLOOD PRESSURE:  147/63.     02 SATURATIONS:  99%.      RESP: air entry is clear CARDIAC: HS irregular however her rate is controlled. Mo murmers. She appears to be euvolemic  CIRCULATION:     EDEMA/VARICOSITIES:  Extremities:  Still some edema.       ASSESSMENT/PLAN:               New onset of type 2 diabetes.   We are going to need to increase her Levemir even further.  If there are plans for her to go home, I doubt the patient will be able to do insulin injections.   This will need to be planned for.     Lower extremity edema.  I think she will need TED hose. Likely related to veinous insufficiency  Atrial fibrillation.  Her heart rate is controlled.     CPT CODE: 83779

## 2015-01-21 ENCOUNTER — Encounter: Payer: Self-pay | Admitting: Internal Medicine

## 2015-01-21 ENCOUNTER — Non-Acute Institutional Stay (SKILLED_NURSING_FACILITY): Payer: Medicare Other | Admitting: Internal Medicine

## 2015-01-21 DIAGNOSIS — F329 Major depressive disorder, single episode, unspecified: Secondary | ICD-10-CM | POA: Diagnosis not present

## 2015-01-21 DIAGNOSIS — I5030 Unspecified diastolic (congestive) heart failure: Secondary | ICD-10-CM | POA: Diagnosis not present

## 2015-01-21 DIAGNOSIS — E118 Type 2 diabetes mellitus with unspecified complications: Secondary | ICD-10-CM | POA: Diagnosis not present

## 2015-01-21 DIAGNOSIS — I4891 Unspecified atrial fibrillation: Secondary | ICD-10-CM

## 2015-01-21 DIAGNOSIS — E1165 Type 2 diabetes mellitus with hyperglycemia: Secondary | ICD-10-CM

## 2015-01-21 DIAGNOSIS — IMO0002 Reserved for concepts with insufficient information to code with codable children: Secondary | ICD-10-CM

## 2015-01-21 DIAGNOSIS — F32A Depression, unspecified: Secondary | ICD-10-CM

## 2015-01-21 NOTE — Progress Notes (Signed)
Patient ID: Christine [name], female   DOB: 10/13/1940, 74 y.o.   MRN: [id number]     This is a discharge note Facility; [facility] Chief complaint; discharge note  HPI--; this is a patient who was admitted the after being found at home for possible falls and increasing confusion. CBG noted to be 426. The emergency room noted to have atrial fibrillation with RVR. She was started on cardiazem. After IV Cardizem  she was switched to oral therapy. Discharge medications included digoxin and metoprolol. Her troponins remained flat and it was not felt that she needed any further evaluation, it was felt she had demand ischemia. Her acute encephalopathy resolved. She was started on insulin and due to a hemoglobin A1c of 14. I Apparently she had not been on anything for diabetes previously   Her atrial fibrillation appears to be rate controlled on the Lopressor she is anticoagulated on Cymbalta-Dr. Dellia Nims initially saw apparently there was some concern about tachycardia but per his assessment this was stable and she has remained stable during her stay here  Regards to diabetes she is on sliding scale NovoLog before meals meals as well as Levemir this has been titrated up to 30 mg daily at bedtime-she still has variability but I do not see consistenthighsr lows readings appear to be mainly from the lower 100s to the mid 200 range  .   Labs.  01/14/2015.  Sodium 141 potassium 3.9 BUN 17 creatinine 0.87.  WBC 5.0 hemoglobin 11.9 platelets 250.  Magnesium 1.9 CBC Latest Ref Rng 12/31/2014 11/05/2011 11/04/2011  WBC 4.0 - 10.5 K/uL 12.1(H) 5.6 7.3  Hemoglobin 12.0 - 15.0 g/dL 14.3 12.0 12.6  Hematocrit 36.0 - 46.0 % 41.9 38.3 38.9  Platelets 150 - 400 K/uL 144(L) 191 185    BMP Latest Ref Rng 12/31/2014 07/04/2012 01/31/2012  Glucose 65 - 99 mg/dL 459(H) 173(H) 192(H)  BUN 6 - 20 mg/dL 13 27(H) 25(H)  Creatinine 0.44 - 1.00 mg/dL 1.03(H) 0.90 1.05  Sodium 135 - 145 mmol/L 132(L) 141 136  Potassium 3.5 -  5.1 mmol/L 3.8 4.5 4.7  Chloride 101 - 111 mmol/L 99(L) 106 101  CO2 22 - 32 mmol/L 22 24 26   Calcium 8.9 - 10.3 mg/dL 8.8(L) 9.9 9.7   Past Medical History  Diagnosis Date  . Seasonal allergies   . Headache(784.0)     twice weekly  . Palpitations   . Hypertension   . Hyperlipidemia   . Vertigo   . Dyspnea on exertion     pedal edema  . Chronic diarrhea     diverticulosis  . Stress incontinence   . Diabetes mellitus, type 2     Diabetic neuropathy  . Bilateral lower extremity edema   . Osteopenia     DEXA scan 01/2010  . Gout   . Atrial fibrillation 08/2011    First diagnosed in 08/2011; duration of arrhythmia is uncertain  . Dementia    Past Surgical History  Procedure Laterality Date  . Cesarean section      x 2  . Appendectomy    . Umbilical hernia repair    . Knee arthroscopy w/ meniscal repair  2007    Bilateral  . Hammer toe surgery      Bilateral hammer toe amputation  . Incisional hernia repair    . Breast biopsy  2002    Right  . Colonoscopy  2007    Negative screening study   Current Outpatient Prescriptions on File Prior to  Visit  Medication Sig Dispense Refill  . cetirizine (ZYRTEC) 10 MG tablet Take 10 mg by mouth daily.    . cholecalciferol (VITAMIN D) 1000 UNITS tablet Take 1,000 Units by mouth daily.    . citalopram (CELEXA) 20 MG tablet Take 20 mg by mouth daily.    . digoxin (LANOXIN) 0.125 MG tablet TAKE 1 TABLET ONCE DAILY. 30 tablet 6  . insulin aspart (NOVOLOG) 100 UNIT/ML injection Inject 0-15 Units into the skin 3 (three) times daily with meals. CBG < 70: implement hypoglycemia protocol CBG 70 - 120: 0 units CBG 121 - 150: 2 units CBG 151 - 200: 3 units CBG 201 - 250: 5 units CBG 251 - 300: 8 units CBG 301 - 350: 11 units CBG 351 - 400: 15 units CBG > 400: call MD and obtain STAT lab verification    . insulin detemir (LEVEMIR) 100 UNIT/ML injection Inject 0.3 mLs (30 Units total) into the skin daily.    . magnesium oxide (MAG-OX) 400  (241.3 MG) MG tablet Take 1 tablet (400 mg total) by mouth daily.    . meclizine (ANTIVERT) 12.5 MG tablet Take 12.5 mg by mouth 3 (three) times daily as needed. For dizziness    . metoprolol (LOPRESSOR) 100 MG tablet Take 1 tablet (100 mg total) by mouth 2 (two) times daily.    . Multiple Vitamin (MULTIVITAMIN WITH MINERALS) TABS Take 1 tablet by mouth every morning.    . potassium chloride (K-DUR) 10 MEQ tablet TAKE 4 TABLETS ONCE DAILY. 120 tablet 6  . pravastatin (PRAVACHOL) 40 MG tablet Take 1 tablet (40 mg total) by mouth every evening. 30 tablet 6  . rivaroxaban (XARELTO) 20 MG TABS tablet TAKE (1) TABLET BY MOUTH ONCE DAILY.    . [DISCONTINUED] K-DUR 20 MEQ tablet TAKE 2 TABLETS BY MOUTH ONCE DAILY. 60 tablet 5     Social: Apparently living alone in [place] she will be with her daughter for a while. N   reports that she has never smoked. She has never used smokeless tobacco. She reports that she does not drink alcohol or use illicit drugs.  family history is not on file. She was adopted.  Review of Systems: Somewhat limited since patient is a poor storing obtain from patient nursing family member.  General no complaints of fever or chills.  Skin does not complain of rashes or itching.  Head ears eyes nose mouth and throat does not complaining of sore throat or visual changes.  Respiratory no shortness breath or cough.  Cardiac no chest pain has baseline lower extremity edema actually appears to be improved according to her daughter.  GI does not complain of nausea vomiting diarrhea constipation reports she has a good appetite.  GU does not complain of dysuria.  Musculoskeletal does not complain of joint pain she does ambulate although is somewhat steady.  Neurologic does not complain of dizziness headache or syncopal-type feelings.  Psych apparently does have some mild cognitive deficits.  Physical examination Te mp to 97.2 pulse 82 respirations 24 blood pressure  122/82-101/65 recently recent pulses of been 8587 and 90-weight is 189 this appears to be down approximately 3 pounds over the past couple weeks In general this is a pleasant elderly female in no distress.  Her skin is warm and dry.    HEENT oral exam is normal. No oropharyngeal lesions are seen   Respiratory; clear entry bilaterally no labored breathing Cardiac heart sounds are regular no murmurs no bruits has some  mild lower extremity edema apparently this is improved Abdomen; distended, bowel sounds are positive no liver no spleen no masses   Musculoskeletal; she has significant osteoarthritis of both knees, bilateral Charcot feet which are mild Neurologic; could not appreciate any overt focal deficits does have history of some mild right leg weakness she is able to ambulate and stand without assistance although somewhat weak--stiff  initially although she appears to get a bit stronger as she ambulates    Psych she is oriented to self pleasant and appropriate but apparently does have some mild cognitive deficits    Impression/plan #1 chronic atrial fibrillation.  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--- initially she had not been on any medication-she is now on Levemir 30 units a day as well as sliding scale insulin--blood sugars appear to be relatively stable there is some variability but I do not see consistent lows below 100 or highs much above the mid 200s this will need follow-up by primary care provider she also will need diabetic teaching-and they will provide that to her daughter who she will be with  #3-suspected mild dementia-again she will have the support of her daughter who will be with her-this will warrant follow up by primary care provider she currently is not on any  specific medication-she is pleasant and appropriate but I suspect she will need more assistance here in the future secondary to this.  #4-history of depression this appears stable on Celexa.    . #5hyperlipidemia on Pravachol  #6-apparently history of diastolic CHF class II per review of cardiology note-this was thought to be resolved I do note she is on potassium 40 mEq a day I suspect there is some history of hypo-kalemia in the past appears magnesium level taken here was within normal range at 1.9-will update a BMP for discharge.  Of note she is also on digoxin will update a dead toxin level as well as update a CBC since she is on Zaroxolyn.  [id number] note greater than 30 minutes spent on this discharge summary-greater than 50% of time spent coordinating plan of care      ------------------------------------------------------------------- Indications:      Atrial fibrillation - 427.31.  ------------------------------------------------------------------- Study Conclusions  - Left ventricle: The cavity size was normal. Wall thickness was   increased in a pattern of mild LVH. Systolic function was normal.   The estimated ejection fraction was in the range of 55% to 60%.   Wall motion was normal; there were no regional wall motion   abnormalities. The study was not technically sufficient to allow   evaluation of LV diastolic dysfunction due to atrial   fibrillation. - Aortic valve: Mildly calcified annulus. Trileaflet; mildly   thickened leaflets. Valve area (VTI): 2.03 cm^2. Valve area   (Vmax): 2.24 cm^2. - Mitral valve: Mildly calcified annulus. Mildly thickened leaflets   . There was mild regurgitation. - Left atrium: The atrium was severely dilated. - Right atrium: The atrium was severely dilated. - Pulmonary arteries: Systolic pressure was moderately increased.   PA peak pressure: 46 mm Hg (S). - Technically adequate study.  Transthoracic echocardiography.  M-mode,  complete 2D, spectral Doppler, and color Doppler.  Birthdate:  Patient birthdate: [birth year].  Age:  Patient is 74 yr old.  Sex:  Gender: female. BMI: 29.7 kg/m^2.  Blood pressure:     106/66  Patient status: Inpatient.  Study date:  Study date: 12/31/2014. Study time: 03:21 PM.  Location:  ICU/CCU  -------------------------------------------------------------------  ------------------------------------------------------------------- Left ventricle:  The cavity size was normal. Wall thickness was increased in a pattern of mild LVH. Systolic function was normal. The estimated ejection fraction was in the range of 55% to 60%. Wall motion was normal; there were no regional wall motion abnormalities. The study was not technically sufficient to allow evaluation of LV diastolic dysfunction due to atrial fibrillation.   ------------------------------------------------------------------- Aortic valve:   Mildly calcified annulus. Trileaflet; mildly thickened leaflets.  Doppler:   There was no stenosis.   There was no significant regurgitation.    VTI ratio of LVOT to aortic valve: 0.59. Valve area (VTI): 2.03 cm^2. Indexed valve area (VTI): 1.02 cm^2/m^2. Peak velocity ratio of LVOT to aortic valve: 0.65. Valve area (Vmax): 2.24 cm^2. Indexed valve area (Vmax): 1.12 cm^2/m^2.  Mean gradient (S): 3 mm Hg. Peak gradient (S): 5 mm Hg.  ------------------------------------------------------------------- Aorta:  Aortic root: The aortic root was normal in size.  ------------------------------------------------------------------- Mitral valve:   Mildly calcified annulus. Mildly thickened leaflets .  Doppler:   There was no evidence for stenosis.   There was mild regurgitation.    Peak gradient (D): 3 mm Hg.  ------------------------------------------------------------------- Left atrium:  The atrium was severely dilated.  ------------------------------------------------------------------- Right  ventricle:  The cavity size was normal. Systolic function was normal.  ------------------------------------------------------------------- Pulmonic valve:   Not well visualized.  Doppler:   There was no evidence for stenosis.   There was mild regurgitation.  ------------------------------------------------------------------- Tricuspid valve:   Normal thickness leaflets.  Doppler:   There was no evidence for stenosis.   There was mild regurgitation.  ------------------------------------------------------------------- Pulmonary artery:   Systolic pressure was moderately increased.  ------------------------------------------------------------------- Right atrium:  The atrium was severely dilated.  ------------------------------------------------------------------- Pericardium:  There was no pericardial effusion.  ------------------------------------------------------------------- Systemic veins:  IVC is dilated with normal respiratory variation.

## 2015-01-22 ENCOUNTER — Encounter (HOSPITAL_COMMUNITY)
Admission: AD | Admit: 2015-01-22 | Discharge: 2015-01-22 | Disposition: A | Discharge status: Home / Self Care | Payer: Medicare Other | Source: Skilled Nursing Facility | Attending: Internal Medicine | Admitting: Internal Medicine

## 2015-01-22 LAB — COMPREHENSIVE METABOLIC PANEL
ALK PHOS: 60 U/L (ref 38–126)
ALT: 15 U/L (ref 14–54)
AST: 18 U/L (ref 15–41)
Albumin: 3.2 g/dL — ABNORMAL LOW (ref 3.5–5.0)
Anion gap: 3 — ABNORMAL LOW (ref 5–15)
BUN: 23 mg/dL — AB (ref 6–20)
CALCIUM: 8.8 mg/dL — AB (ref 8.9–10.3)
CHLORIDE: 107 mmol/L (ref 101–111)
CO2: 29 mmol/L (ref 22–32)
CREATININE: 0.83 mg/dL (ref 0.44–1.00)
GFR calc Af Amer: >60 mL/min (ref >60)
GFR calc non Af Amer: >60 mL/min (ref >60)
Glucose, Bld: 91 mg/dL (ref 65–99)
Potassium: 4 mmol/L (ref 3.5–5.1)
SODIUM: 139 mmol/L (ref 135–145)
Total Bilirubin: 0.6 mg/dL (ref 0.3–1.2)
Total Protein: 6.8 g/dL (ref 6.5–8.1)

## 2015-01-22 LAB — CBC WITH DIFFERENTIAL/PLATELET
BASOS PCT: 1 %
Basophils Absolute: 0 10*3/uL (ref 0.0–0.1)
EOS ABS: 0.2 10*3/uL (ref 0.0–0.7)
EOS PCT: 4 %
HCT: 40.8 % (ref 36.0–46.0)
Hemoglobin: 13 g/dL (ref 12.0–15.0)
LYMPHS ABS: 2.3 10*3/uL (ref 0.7–4.0)
Lymphocytes Relative: 37 %
MCH: 29.5 pg (ref 26.0–34.0)
MCHC: 31.9 g/dL (ref 30.0–36.0)
MCV: 92.5 fL (ref 78.0–100.0)
MONO ABS: 0.7 10*3/uL (ref 0.1–1.0)
MONOS PCT: 11 %
Neutro Abs: 3 10*3/uL (ref 1.7–7.7)
Neutrophils Relative %: 47 %
PLATELETS: 222 10*3/uL (ref 150–400)
RBC: 4.41 MIL/uL (ref 3.87–5.11)
RDW: 13.7 % (ref 11.5–15.5)
WBC: 6.2 10*3/uL (ref 4.0–10.5)

## 2015-01-22 LAB — DIGOXIN LEVEL: DIGOXIN LVL: 0.6 ng/mL — AB (ref 0.8–2.0)

## 2015-01-26 ENCOUNTER — Non-Acute Institutional Stay (SKILLED_NURSING_FACILITY): Payer: Medicare Other | Admitting: Internal Medicine

## 2015-01-26 DIAGNOSIS — G309 Alzheimer's disease, unspecified: Secondary | ICD-10-CM

## 2015-01-26 DIAGNOSIS — F0280 Dementia in other diseases classified elsewhere without behavioral disturbance: Secondary | ICD-10-CM

## 2015-01-26 DIAGNOSIS — E0842 Diabetes mellitus due to underlying condition with diabetic polyneuropathy: Secondary | ICD-10-CM | POA: Diagnosis not present

## 2015-01-30 ENCOUNTER — Non-Acute Institutional Stay (SKILLED_NURSING_FACILITY): Payer: Medicare Other | Admitting: Internal Medicine

## 2015-01-30 ENCOUNTER — Encounter: Payer: Self-pay | Admitting: Internal Medicine

## 2015-01-30 ENCOUNTER — Ambulatory Visit (HOSPITAL_COMMUNITY)
Admit: 2015-01-30 | Discharge: 2015-01-30 | Disposition: A | Discharge status: Home / Self Care | Payer: Medicare Other | Attending: Internal Medicine | Admitting: Internal Medicine

## 2015-01-30 DIAGNOSIS — M25579 Pain in unspecified ankle and joints of unspecified foot: Secondary | ICD-10-CM | POA: Insufficient documentation

## 2015-01-30 DIAGNOSIS — M14671 Charcot's joint, right ankle and foot: Secondary | ICD-10-CM | POA: Diagnosis not present

## 2015-01-30 NOTE — Progress Notes (Signed)
Patient ID: Christine [name], female   DOB: 03/11/1941, 73 y.o.   MRN: [id number]      This is an acute visit Facility; [facility] Chief complaint; --acute visit secondary to question  increased right foot deformity--? pain  HPI--; this is a patient who was admitted the after being found at home for possible falls and increasing confusion. CBG noted to be 426. The emergency room noted to have atrial fibrillation with RVR. She was started on cardiazem. After IV Cardizem  she was switched to oral therapy. Discharge medications included digoxin and metoprolol. Her troponins remained flat and it was not felt that she needed any further evaluation, it was felt she had demand ischemia. Her acute encephalopathy resolved. She was started on insulin and due to a hemoglobin A1c of 14. I Apparently she had not been on anything for diabetes previously \\Currently  she is on Novolin 70/30 this was recently switched by Dr. Adam Phenix had been slated for discharge but apparently there are issues with finding a primary care provider for follow-up as well as making sure her diabetes is stable and that family feel comfortable administering her insulin.     Her atrial fibrillation appears to be rate controlled on the Lopressor she is anticoagulated on Cymbalta-Dr. Dellia Nims initially saw apparently there was some concern about tachycardia but per his assessment this was stable and she has remained stable during her stay here  Patient has done well here nursing staff apparently noted while ambulating  she appeared to be a little awkward appearance possibly a lateral deviation of her right foot and they were requesting x-ray--she does have a history of bilateral Charcot foot-is not really complaining of pain today although some question whether she's had increased pain while ambulating previously .   Labs.  Sept- 22nd 2016.  Digoxin level 0.6.  Sodium 139 potassium 4 creatinine 0.83.  Albumin 3.2 otherwise liver  function tests within normal limits.  WBC 6.2 hemoglobin 13.0 platelets 222  01/14/2015.  Sodium 141 potassium 3.9 BUN 17 creatinine 0.87.  WBC 5.0 hemoglobin 11.9 platelets 250.  Magnesium 1.9 CBC Latest Ref Rng 12/31/2014 11/05/2011 11/04/2011  WBC 4.0 - 10.5 K/uL 12.1(H) 5.6 7.3  Hemoglobin 12.0 - 15.0 g/dL 14.3 12.0 12.6  Hematocrit 36.0 - 46.0 % 41.9 38.3 38.9  Platelets 150 - 400 K/uL 144(L) 191 185    BMP Latest Ref Rng 12/31/2014 07/04/2012 01/31/2012  Glucose 65 - 99 mg/dL 459(H) 173(H) 192(H)  BUN 6 - 20 mg/dL 13 27(H) 25(H)  Creatinine 0.44 - 1.00 mg/dL 1.03(H) 0.90 1.05  Sodium 135 - 145 mmol/L 132(L) 141 136  Potassium 3.5 - 5.1 mmol/L 3.8 4.5 4.7  Chloride 101 - 111 mmol/L 99(L) 106 101  CO2 22 - 32 mmol/L 22 24 26   Calcium 8.9 - 10.3 mg/dL 8.8(L) 9.9 9.7   Past Medical History  Diagnosis Date  . Seasonal allergies   . Headache(784.0)     twice weekly  . Palpitations   . Hypertension   . Hyperlipidemia   . Vertigo   . Dyspnea on exertion     pedal edema  . Chronic diarrhea     diverticulosis  . Stress incontinence   . Diabetes mellitus, type 2     Diabetic neuropathy  . Bilateral lower extremity edema   . Osteopenia     DEXA scan 01/2010  . Gout   . Atrial fibrillation 08/2011    First diagnosed in 08/2011; duration of arrhythmia is uncertain  .  Dementia    Past Surgical History  Procedure Laterality Date  . Cesarean section      x 2  . Appendectomy    . Umbilical hernia repair    . Knee arthroscopy w/ meniscal repair  2007    Bilateral  . Hammer toe surgery      Bilateral hammer toe amputation  . Incisional hernia repair    . Breast biopsy  2002    Right  . Colonoscopy  2007    Negative screening study   Current Outpatient Prescriptions on File Prior to Visit  Medication Sig Dispense Refill  . cetirizine (ZYRTEC) 10 MG tablet Take 10 mg by mouth daily.    . cholecalciferol (VITAMIN D) 1000 UNITS tablet Take 1,000 Units by mouth daily.    .  citalopram (CELEXA) 20 MG tablet Take 20 mg by mouth daily.    . digoxin (LANOXIN) 0.125 MG tablet TAKE 1 TABLET ONCE DAILY. 30 tablet 6  . insulin aspart (NOVOLOG) 100 UNIT/ML injection Inject 0-15 Units into the skin 3 (three) times daily with meals. CBG < 70: implement hypoglycemia protocol CBG 70 - 120: 0 units CBG 121 - 150: 2 units CBG 151 - 200: 3 units CBG 201 - 250: 5 units CBG 251 - 300: 8 units CBG 301 - 350: 11 units CBG 351 - 400: 15 units CBG > 400: call MD and obtain STAT lab verification    . insulin detemir (LEVEMIR) 100 UNIT/ML injection Inject 0.3 mLs (30 Units total) into the skin daily.    . magnesium oxide (MAG-OX) 400 (241.3 MG) MG tablet Take 1 tablet (400 mg total) by mouth daily.    . meclizine (ANTIVERT) 12.5 MG tablet Take 12.5 mg by mouth 3 (three) times daily as needed. For dizziness    . metoprolol (LOPRESSOR) 100 MG tablet Take 1 tablet (100 mg total) by mouth 2 (two) times daily.    . Multiple Vitamin (MULTIVITAMIN WITH MINERALS) TABS Take 1 tablet by mouth every morning.    . potassium chloride (K-DUR) 10 MEQ tablet TAKE 4 TABLETS ONCE DAILY. 120 tablet 6  . pravastatin (PRAVACHOL) 40 MG tablet Take 1 tablet (40 mg total) by mouth every evening. 30 tablet 6  . rivaroxaban (XARELTO) 20 MG TABS tablet TAKE (1) TABLET BY MOUTH ONCE DAILY.    . [DISCONTINUED] K-DUR 20 MEQ tablet TAKE 2 TABLETS BY MOUTH ONCE DAILY. 60 tablet 5     Social: Apparently living alone in [place] she will be with her daughter for a while. N   reports that she has never smoked. She has never used smokeless tobacco. She reports that she does not drink alcohol or use illicit drugs.  family history is not on file. She was adopted.  Review of Systems: Somewhat limited since patient historian--obtained from nursing and staff.  General no complaints of fever or chills.  Skin does not complain of rashes or itching.  Head ears eyes nose mouth and throat does not complaining of sore  throat or visual changes.  Respiratory no shortness breath or cough.  Cardiac no chest pain has baseline lower extremity edema actually appears to be improved according to her daughter.  GI does not complain of nausea vomiting diarrhea constipation reports she has a good appetite.    Musculoskeletal does not complain of joint pain she does ambulate although is somewhat steady. Again staff is concerned about possible right foot lateral deviation  Neurologic does not complain of dizziness headache or syncopal-type  feelings.  Psych apparently does have some mild cognitive deficits.  Physical examination  Temperature 97.2 pulse 90 respirations 24 blood pressure 122/82 weight is 189.6 In general this is a pleasant elderly female in no distress.  Her skin is warm and dry.    HEENT oral exam is normal. No oropharyngeal lesions are seen   Respiratory; clear entry bilaterally no labored breathing Cardiac heart sounds are regular  irregular no murmurs no bruits has some mild lower extremity edema apparently this is improved Abdomen; distended, bowel sounds are positive no liver no spleen no masses   Musculoskeletal; she has significant osteoarthritis of both knees, bilateral Charcot feet which are mild--nursing staff feels that this may be  changed somewhat from baseline  On right-- there is no tenderness erythema there is a positive pedal pulse I did not see increased edema from baseline Neurologic; could not appreciate any overt focal deficits does have history of some mild right leg weakness she is able to ambulate and stand without assistance although somewhat weak--stiff  initially although she appears to get a bit stronger as she ambulates    Psych she is oriented to self pleasant and appropriate but apparently does have some mild cognitive deficits   Assessment and plan.  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

## 2015-01-30 NOTE — Progress Notes (Addendum)
Patient ID: Christine [name], female   DOB: [birth year], 74 y.o.   MRN: [id number]                PROGRESS NOTE  DATE:  01/26/2015         FACILITY: [facility]                LEVEL OF CARE:   SNF   Acute Visit                CHIEF COMPLAINT:  Review of diabetes, pre-discharge issues.      HISTORY OF PRESENT ILLNESS:  This is a patient who was admitted after being found at home with falls and increasing confusion.  Her CBG was noted to be 426 in the ER.  She was not a known diabetic.  In fact, I do not think the patient had been seen by a doctor in 10 years.    In the emergency room, she had atrial fibrillation.    Her hemoglobin A1c was 14.  She was started on insulin.  Currently, she receives Levemir 30 U as well as a NovoLog sliding scale.    There are plans for this patient to go home with her daughter.  The daughter expressed concern about the frequency of the insulin injections.  Apparently, a granddaughter will help with this.  There was also a question about whether she could be transitioned to pills for her diabetes.  Yesterday's blood sugars were 260, 212, 292.  I do not think there is any question that she will not be able to take a medication regimen for her diabetes outside of insulin at this point.  She probably went through that some time ago.  I suspect she will be insulin-dependent.        Past Medical History  Diagnosis Date  . Seasonal allergies   . Headache(784.0)     twice weekly  . Palpitations   . Hypertension   . Hyperlipidemia   . Vertigo   . Dyspnea on exertion     pedal edema  . Chronic diarrhea     diverticulosis  . Stress incontinence   . Diabetes mellitus, type 2     Diabetic neuropathy  . Bilateral lower extremity edema   . Osteopenia     DEXA scan 01/2010  . Gout   . Atrial fibrillation 08/2011    First diagnosed in 08/2011; duration of arrhythmia is uncertain  . Dementia      REVIEW OF SYSTEMS:  General: no weight loss, no  fever   CHEST/RESPIRATORY:  No shortness of breath.   CARDIAC:  No chest pain.   GI:  No abdominal pain.   No nausea or vomiting.    GU:  No dysuria.    CNS; no weakness MS: there are cognitive issues here. moderate  PHYSICAL EXAMINATION:   GENERAL APPEARANCE:  The patient looks well.  Bright and alert.   CHEST/RESPIRATORY:  Clear air entry bilaterally.    CARDIOVASCULAR:   CARDIAC:  Heart sounds are normal.  She appears to be regular.        GASTROINTESTINAL:   ABDOMEN:  Obese.  No masses.     LIVER/SPLEEN/KIDNEYS:  No liver, no spleen.   GENITOURINARY:   BLADDER:  No suprapubic or costovertebral angle tenderness.    NEUROLOGICAL:    BALANCE/GAIT:  She has a bilateral Trendelenburg gait, but seems to do well.  She is supposed to be using a walker, but  is certainly not using it in her room.   PSYCHIATRIC:   MENTAL STATUS:  Lillia Corporal mini-mental status done in the facility is 18/30.    ASSESSMENT/PLAN:           Type 2 diabetes with neuropathy.  There is no question that she will need insulin at this point.  Her hemoglobin A1c was over 14.  Most of her blood sugars are in the 200 range currently.  With regards to the frequency of her insulin dosing, I can move her to a 70/30 b.i.d. dosing, which might save the family some stress of doing multiple injections during the day and reduce that to two.  I am going to start her on 70/30 at 40 U b.i.d., starting tomorrow.  I will review this on Wednesday.  This woman is going to go home with her daughter.   I am certain the patient would not be able to do insulin by herself.  She will also need Accu-Cheks.    Moderately advanced dementia.  She probably should get Aricept, which I will startbefore seh leaves  On magnesium.  Last magnesium I see was 1.9 on 01/14/2015.  I certainly do not think she needs that.

## 2015-02-02 ENCOUNTER — Encounter (HOSPITAL_COMMUNITY)
Admission: RE | Admit: 2015-02-02 | Discharge: 2015-02-02 | Disposition: A | Discharge status: Home / Self Care | Payer: Medicare Other | Source: Skilled Nursing Facility | Attending: Internal Medicine | Admitting: Internal Medicine

## 2015-02-02 DIAGNOSIS — I482 Chronic atrial fibrillation: Secondary | ICD-10-CM | POA: Insufficient documentation

## 2015-02-02 LAB — DIGOXIN LEVEL: Digoxin Level: 0.2 ng/mL — ABNORMAL LOW (ref 0.8–2.0)

## 2015-02-05 ENCOUNTER — Ambulatory Visit (HOSPITAL_COMMUNITY)
Admission: RE | Admit: 2015-02-05 | Discharge: 2015-02-05 | Disposition: A | Discharge status: Home / Self Care | Payer: Medicare Other | Source: Ambulatory Visit | Attending: Internal Medicine | Admitting: Internal Medicine

## 2015-02-05 DIAGNOSIS — I4891 Unspecified atrial fibrillation: Secondary | ICD-10-CM | POA: Diagnosis not present

## 2015-02-05 DIAGNOSIS — I10 Essential (primary) hypertension: Secondary | ICD-10-CM | POA: Diagnosis not present

## 2015-02-05 DIAGNOSIS — E119 Type 2 diabetes mellitus without complications: Secondary | ICD-10-CM | POA: Insufficient documentation

## 2015-02-05 DIAGNOSIS — R05 Cough: Secondary | ICD-10-CM | POA: Diagnosis present

## 2015-02-06 ENCOUNTER — Non-Acute Institutional Stay (SKILLED_NURSING_FACILITY): Payer: Medicare Other | Admitting: Internal Medicine

## 2015-02-06 DIAGNOSIS — I4891 Unspecified atrial fibrillation: Secondary | ICD-10-CM

## 2015-02-06 DIAGNOSIS — F329 Major depressive disorder, single episode, unspecified: Secondary | ICD-10-CM | POA: Diagnosis not present

## 2015-02-06 DIAGNOSIS — E876 Hypokalemia: Secondary | ICD-10-CM

## 2015-02-06 DIAGNOSIS — R739 Hyperglycemia, unspecified: Secondary | ICD-10-CM

## 2015-02-06 DIAGNOSIS — F32A Depression, unspecified: Secondary | ICD-10-CM

## 2015-02-06 NOTE — Progress Notes (Signed)
Patient ID: Christine [name], female   DOB: [birth year], 74 y.o.   MRN: [id number]       This is a discharge  visit Facility; [facility]  Chief complaint; --Discharge note  HPI--; this is a patient who was admitted the after being found at home for possible falls and increasing confusion. CBG noted to be 426. The emergency room noted to have atrial fibrillation with RVR. She was started on cardiazem. After IV Cardizem  she was switched to oral therapy. Discharge medications included digoxin and metoprolol. Her troponins remained flat and it was not felt that she needed any further evaluation, it was felt she had demand ischemia. Her acute encephalopathy resolved. She was started on insulin and due to a hemoglobin A1c of 14. I Apparently she had not been on anything for diabetes previously \\Currently  she is on Novolin 70/30  40 units twice a day-her blood sugars have improved although still quite a bit of variability readings from the low 100s into the mid 200s at times in the morning.  At noon blood sugars appear to be more mid 100s to mid 200s.  Up 4 PM ranging more from the mid 100s to mid 200s as well.  More variability at night ranging from the higher 100s to high 200s.  .     Her atrial fibrillation appears to be rate controlled on the Lopressor she is anticoagulated on Xarelto-Dr. Dellia Nims initially saw apparently there was some concern about tachycardia but per his assessment this was stable and she has remained stable during her stay here  Patient has done well  --she does have a history of bilateral Charcot foot-this remains relatively unchanged.  She will be going home with her daughter who is very supportive initially there was some concern about ability to administer insulin but apparently this has been resolved and her daughter has been educated.  This will need close follow-up by primary care provider.  She also was treated with Mucinex for cough her x-ray was negative for  any acute process  .   Labs.  02/02/2015.  Digoxin  0.2.    Sept- 22nd 2016.  Digoxin level 0.6.  Sodium 139 potassium 4 creatinine 0.83.  Albumin 3.2 otherwise liver function tests within normal limits.  WBC 6.2 hemoglobin 13.0 platelets 222  01/14/2015.  Sodium 141 potassium 3.9 BUN 17 creatinine 0.87.  WBC 5.0 hemoglobin 11.9 platelets 250.  Magnesium 1.9 CBC Latest Ref Rng 12/31/2014 11/05/2011 11/04/2011  WBC 4.0 - 10.5 K/uL 12.1(H) 5.6 7.3  Hemoglobin 12.0 - 15.0 g/dL 14.3 12.0 12.6  Hematocrit 36.0 - 46.0 % 41.9 38.3 38.9  Platelets 150 - 400 K/uL 144(L) 191 185    BMP Latest Ref Rng 12/31/2014 07/04/2012 01/31/2012  Glucose 65 - 99 mg/dL 459(H) 173(H) 192(H)  BUN 6 - 20 mg/dL 13 27(H) 25(H)  Creatinine 0.44 - 1.00 mg/dL 1.03(H) 0.90 1.05  Sodium 135 - 145 mmol/L 132(L) 141 136  Potassium 3.5 - 5.1 mmol/L 3.8 4.5 4.7  Chloride 101 - 111 mmol/L 99(L) 106 101  CO2 22 - 32 mmol/L 22 24 26   Calcium 8.9 - 10.3 mg/dL 8.8(L) 9.9 9.7   Past Medical History  Diagnosis Date  . Seasonal allergies   . Headache(784.0)     twice weekly  . Palpitations   . Hypertension   . Hyperlipidemia   . Vertigo   . Dyspnea on exertion     pedal edema  . Chronic diarrhea     diverticulosis  .  Stress incontinence   . Diabetes mellitus, type 2     Diabetic neuropathy  . Bilateral lower extremity edema   . Osteopenia     DEXA scan 01/2010  . Gout   . Atrial fibrillation 08/2011    First diagnosed in 08/2011; duration of arrhythmia is uncertain  . Dementia    Past Surgical History  Procedure Laterality Date  . Cesarean section      x 2  . Appendectomy    . Umbilical hernia repair    . Knee arthroscopy w/ meniscal repair  2007    Bilateral  . Hammer toe surgery      Bilateral hammer toe amputation  . Incisional hernia repair    . Breast biopsy  2002    Right  . Colonoscopy  2007    Negative screening study   Current Outpatient Prescriptions on File Prior to Visit    Medication Sig Dispense Refill  . cetirizine (ZYRTEC) 10 MG tablet Take 10 mg by mouth daily.    . cholecalciferol (VITAMIN D) 1000 UNITS tablet Take 1,000 Units by mouth daily.    . citalopram (CELEXA) 20 MG tablet Take 20 mg by mouth daily.    . digoxin (LANOXIN) 0.125 MG tablet TAKE 1 TABLET ONCE DAILY. 30 tablet 6  . insulin aspart (NOVOLOG) 100 UNIT/ML injection Inject 0-15 Units into the skin 3 (three) times daily with meals. CBG < 70: implement hypoglycemia protocol CBG 70 - 120: 0 units CBG 121 - 150: 2 units CBG 151 - 200: 3 units CBG 201 - 250: 5 units CBG 251 - 300: 8 units CBG 301 - 350: 11 units CBG 351 - 400: 15 units CBG > 400: call MD and obtain STAT lab verification    .  Humulin 70/30 40 units before meals breakfast and dinner   40 units twice a day before breakfast and dinner     . magnesium oxide (MAG-OX) 400 (241.3 MG) MG tablet Take 1 tablet (400 mg total) by mouth daily.    . meclizine (ANTIVERT) 12.5 MG tablet Take 12.5 mg by mouth 3 (three) times daily as needed. For dizziness    . metoprolol (LOPRESSOR) 100 MG tablet Take 1 tablet (100 mg total) by mouth 2 (two) times daily.    . Multiple Vitamin (MULTIVITAMIN WITH MINERALS) TABS Take 1 tablet by mouth every morning.    . potassium chloride (K-DUR) 10 MEQ tablet TAKE 4 TABLETS ONCE DAILY. 120 tablet 6  . pravastatin (PRAVACHOL) 40 MG tablet Take 1 tablet (40 mg total) by mouth every evening. 30 tablet 6  . rivaroxaban (XARELTO) 20 MG TABS tablet TAKE (1) TABLET BY MOUTH ONCE DAILY.    Marland Kitchen K-DUR 20 MEQ tablet TAKE 2 TABLETS BY MOUTH ONCE DAILY. 60 tablet 5     Social: Apparently living alone in [place] she will be with her daughter for a while. N   reports that she has never smoked. She has never used smokeless tobacco. She reports that she does not drink alcohol or use illicit drugs.  family history is not on file. She was adopted.  Review of Systems: Somewhat limited since patient historian--obtained from  nursing and staff.  General no complaints of fever or chills.  Skin does not complain of rashes or itching.  Head ears eyes nose mouth and throat does not complaining of sore throat or visual changes.  Respiratory no shortness breath or cough.  Cardiac no chest pain has baseline lower extremity edema appears  to be baseline from previous exam  GI does not complain of nausea vomiting diarrhea constipation reports she has a good appetite.    Musculoskeletal does not complain of joint pain she does ambulate although is somewhat steady. Again staff is concerned about possible right foot lateral deviation  Neurologic does not complain of dizziness headache or syncopal-type feelings.  Psych apparently does have some mild cognitive deficits  .  Physical examination   . She is afebrile pulse 90 respirations 24 blood pressure 122/82 weight is 193.  Her skin is warm and dry.    HEENT oral exam is normal. No oropharyngeal lesions are seen   Respiratory; clear entry bilaterally no labored breathing Cardiac heart sounds are regular  irregular no murmurs no bruits has some mild lower extremity edema apparently this is improved Abdomen; distended, bowel sounds are positive no liver no spleen no masses   Musculoskeletal; she has significant osteoarthritis of both knees, bilateral Charcot feet which are mild she does have weakness when attempting to ambulate with a walker-  Neurologic; could not appreciate any overt focal deficits does have history of some mild right leg weakness she is able to ambulate and stand without assistance although somewhat weak--stiff  initially although she appears to get a bit stronger as she ambulates    Psych she is oriented to self pleasant and appropriate but has significant cognitive deficits    Assessment and plan.  -#1-history of diabetes type 2-has noted above still has somewhat variable blood sugars but appears to be improving this will need  close follow-up as well as family education which has been provided by nursing and she is now thought appropriate for discharge.--Currently on Humulin 70/30 40 units twice a day before breakfast and dinner  #2 history of atrial fibrillation this appears rate controlled she is on Lopressor for rate control as well as the role total for anticoagulation.--She also continues on digoxin  #3 history of hypokalemia she is on supplementation Will update a BMP tomorrow.  #4-history hyperlipidemia she is on a statin will defer aggressive workup to primary care provider since her stay here has been relatively short.  #5 history cough this appears to be resolving she does have Mucinex when necessary  #6-depression this appears to be stable she is on Celexa  #7-history what appears to be cognitive deficits-this will need follow-up by primary care provider-her daughter will be with her which is encouraging.  She will be going home with her daughter she will need home health support for her medical issues including atrial fibrillation follow-up of diabetes as well as PT and OT for further strengthening as well as nursing support again to follow-up her multiple medical issues including atrial fibrillation  diabetes and what appears to be some element of dementia.  [id number] of note greater than 30 minutes spent on this discharge summary-greater than 50% of time spent coordinating plan of care.                   -------------------------------------------------------------------

## 2015-02-07 ENCOUNTER — Other Ambulatory Visit (HOSPITAL_COMMUNITY)
Admission: RE | Admit: 2015-02-07 | Discharge: 2015-02-07 | Disposition: A | Discharge status: Home / Self Care | Payer: Medicare Other | Source: Skilled Nursing Facility | Attending: Internal Medicine | Admitting: Internal Medicine

## 2015-02-07 DIAGNOSIS — I10 Essential (primary) hypertension: Secondary | ICD-10-CM | POA: Diagnosis present

## 2015-02-07 LAB — BASIC METABOLIC PANEL
Anion gap: 6 (ref 5–15)
BUN: 19 mg/dL (ref 6–20)
CALCIUM: 9.1 mg/dL (ref 8.9–10.3)
CHLORIDE: 107 mmol/L (ref 101–111)
CO2: 28 mmol/L (ref 22–32)
CREATININE: 0.92 mg/dL (ref 0.44–1.00)
GFR calc non Af Amer: 60 mL/min — ABNORMAL LOW (ref >60)
GLUCOSE: 114 mg/dL — AB (ref 65–99)
Potassium: 4.4 mmol/L (ref 3.5–5.1)
Sodium: 141 mmol/L (ref 135–145)

## 2015-02-07 LAB — CBC WITH DIFFERENTIAL/PLATELET
BASOS PCT: 1 %
Basophils Absolute: 0 10*3/uL (ref 0.0–0.1)
EOS ABS: 0 10*3/uL (ref 0.0–0.7)
Eosinophils Relative: 1 %
HCT: 41.5 % (ref 36.0–46.0)
HEMOGLOBIN: 13.6 g/dL (ref 12.0–15.0)
LYMPHS ABS: 1.8 10*3/uL (ref 0.7–4.0)
Lymphocytes Relative: 29 %
MCH: 30.8 pg (ref 26.0–34.0)
MCHC: 32.8 g/dL (ref 30.0–36.0)
MCV: 93.9 fL (ref 78.0–100.0)
MONO ABS: 0.6 10*3/uL (ref 0.1–1.0)
MONOS PCT: 9 %
NEUTROS PCT: 60 %
Neutro Abs: 3.9 10*3/uL (ref 1.7–7.7)
Platelets: 201 10*3/uL (ref 150–400)
RBC: 4.42 MIL/uL (ref 3.87–5.11)
RDW: 14 % (ref 11.5–15.5)
WBC: 6.3 10*3/uL (ref 4.0–10.5)

## 2015-02-09 ENCOUNTER — Ambulatory Visit: Payer: Medicare Other | Admitting: Family Medicine

## 2015-02-10 ENCOUNTER — Ambulatory Visit (INDEPENDENT_AMBULATORY_CARE_PROVIDER_SITE_OTHER): Payer: Medicare Other | Admitting: Family Medicine

## 2015-02-10 ENCOUNTER — Encounter: Payer: Self-pay | Admitting: Family Medicine

## 2015-02-10 ENCOUNTER — Other Ambulatory Visit: Payer: Self-pay | Admitting: Family Medicine

## 2015-02-10 VITALS — BP 133/89 | HR 103 | Temp 97.7°F | Ht 67.0 in | Wt 198.0 lb

## 2015-02-10 DIAGNOSIS — J069 Acute upper respiratory infection, unspecified: Secondary | ICD-10-CM | POA: Diagnosis not present

## 2015-02-10 DIAGNOSIS — I4891 Unspecified atrial fibrillation: Secondary | ICD-10-CM | POA: Diagnosis not present

## 2015-02-10 DIAGNOSIS — E118 Type 2 diabetes mellitus with unspecified complications: Secondary | ICD-10-CM

## 2015-02-10 DIAGNOSIS — Z794 Long term (current) use of insulin: Secondary | ICD-10-CM | POA: Diagnosis not present

## 2015-02-10 MED ORDER — METFORMIN HCL 500 MG PO TABS: 500.0000 mg | ORAL_TABLET | Freq: Two times a day (BID) | ORAL | Status: DC

## 2015-02-10 MED ORDER — DIGOXIN 187.5 MCG PO TABS: ORAL_TABLET | ORAL | Status: DC

## 2015-02-10 MED ORDER — HUMULIN 70/30 KWIKPEN (70-30) 100 UNIT/ML ~~LOC~~ SUPN: 34.0000 [IU] | PEN_INJECTOR | Freq: Two times a day (BID) | SUBCUTANEOUS | Status: DC

## 2015-02-10 MED ORDER — RIVAROXABAN 20 MG PO TABS: ORAL_TABLET | ORAL | Status: DC

## 2015-02-10 NOTE — Progress Notes (Signed)
   HPI  Patient presents today to establish care.  She and her daughter present and they explain that she was discharged from the nursing home about 3 days ago.  She spent 5 weeks in the nursing home.  She has new onset diabetes and they are giving her 40 units of NPH twice daily with meals. She's had 2 episodes of hypoglycemia down to 74 and 76 respectively, she had sweating but no perceived symptoms at these times. Fasting blood sugar 1:30 to 180 Random 74-240  A. fib On Xarelto daily, no bleeding No racing heart, breathing easily  CHF, previously had ejection fraction of 40-45% which has improved with medical therapy to 55-60%  PMH: Smoking status noted Her past medical, social, surgical and family history were reviewed and updated in EMR ROS: Per HPI  Objective: BP 133/89 mmHg  Pulse 103  Temp(Src) 97.7 F (36.5 C) (Oral)  Ht 5\' 7"  (1.702 m)  Wt 198 lb (89.812 kg)  BMI 31.00 kg/m2 Gen: NAD, alert, cooperative with exam HEENT: NCAT, nares clear, TMs normal bilaterally with limited view of the left due to cerumen, oropharynx clear  CV: RRR, good S1/S2, no murmur Resp: CTABL, no wheezes, non-labored Abd: SNTND, BS present, no guarding or organomegaly Ext: No edema, warm Neuro: Alert and oriented, No gross deficits  Assessment and plan:  # Type 2 diabetes Moderate control, A1c not available currently, return in 2-3 weeks for A1c and to further titrate insulin Decrease NPH to 34 units twice daily Add metformin, her GFR 60 Keep a log of passing blood sugars +1 postprandial daily  # A. fib Slightly tachy today Level 0.2, check about one week ago, will increase to 0.1875 mg daily Recheck digoxin level next month Continue metoprolol 100 twice a day Refill Xarelto  # CHF Previously reduced ejection fraction now improved on medical therapy Her echo from April 2013 shows an ejection fraction of 40-45%, this has improved to 55-60%. No crackles on exam, leg edema is  mild No need for Lasix at this time, daughter to monitor weight   Meds ordered this encounter  Medications  . DISCONTD: HUMULIN 70/30 KWIKPEN (70-30) 100 UNIT/ML PEN    Sig:   . HUMULIN 70/30 KWIKPEN (70-30) 100 UNIT/ML PEN    Sig: Inject 34 Units into the skin 2 (two) times daily with a meal.    Dispense:  15 mL    Refill:  2    Please dispense QS for 1 month,  . rivaroxaban (XARELTO) 20 MG TABS tablet    Sig: TAKE (1) TABLET BY MOUTH ONCE DAILY.    Dispense:  30 tablet  . digoxin 187.5 MCG TABS    Sig: TAKE 1 TABLET ONCE DAILY.    Dispense:  30 tablet    Refill:  2  . metFORMIN (GLUCOPHAGE) 500 MG tablet    Sig: Take 1 tablet (500 mg total) by mouth 2 (two) times daily with a meal.    Dispense:  180 tablet    Refill:  [place], MD Tristan Schroeder [facility] 02/10/2015, 3:42 PM

## 2015-02-10 NOTE — Patient Instructions (Addendum)
   Great to meet you!  Lets see her back in 2-3 weeks for diabetes  Decrease her 70/30 to 34 units twice daily with meals Start metformin, 1 pill twice daily  I have increased her digoxin, this will help keep her heart rate lower  We will plan to repeat labs at around 1 month after leaving the nursing home

## 2015-02-11 ENCOUNTER — Telehealth: Payer: Self-pay | Admitting: Family Medicine

## 2015-02-15 ENCOUNTER — Encounter: Payer: Self-pay | Admitting: Internal Medicine

## 2015-02-17 ENCOUNTER — Other Ambulatory Visit: Payer: Self-pay

## 2015-02-17 ENCOUNTER — Telehealth: Payer: Self-pay | Admitting: Family Medicine

## 2015-02-18 NOTE — Telephone Encounter (Signed)
V.o. Given per Dr. Wendi Snipes

## 2015-02-24 ENCOUNTER — Telehealth: Payer: Self-pay

## 2015-02-24 ENCOUNTER — Ambulatory Visit (INDEPENDENT_AMBULATORY_CARE_PROVIDER_SITE_OTHER): Payer: Medicare Other | Admitting: Family Medicine

## 2015-02-24 ENCOUNTER — Encounter: Payer: Self-pay | Admitting: Family Medicine

## 2015-02-24 VITALS — BP 131/86 | HR 99 | Temp 97.4°F | Ht 67.0 in | Wt 200.0 lb

## 2015-02-24 DIAGNOSIS — F03918 Unspecified dementia, unspecified severity, with other behavioral disturbance: Secondary | ICD-10-CM | POA: Insufficient documentation

## 2015-02-24 DIAGNOSIS — F0391 Unspecified dementia with behavioral disturbance: Secondary | ICD-10-CM

## 2015-02-24 DIAGNOSIS — R635 Abnormal weight gain: Secondary | ICD-10-CM | POA: Diagnosis not present

## 2015-02-24 DIAGNOSIS — Z794 Long term (current) use of insulin: Secondary | ICD-10-CM | POA: Diagnosis not present

## 2015-02-24 DIAGNOSIS — E118 Type 2 diabetes mellitus with unspecified complications: Secondary | ICD-10-CM | POA: Diagnosis not present

## 2015-02-24 LAB — POCT URINALYSIS DIPSTICK
Bilirubin, UA: NEGATIVE
Blood, UA: NEGATIVE
Glucose, UA: NEGATIVE
Ketones, UA: NEGATIVE
LEUKOCYTES UA: NEGATIVE
Nitrite, UA: NEGATIVE
PH UA: 5
PROTEIN UA: NEGATIVE
UROBILINOGEN UA: NEGATIVE

## 2015-02-24 LAB — POCT UA - MICROSCOPIC ONLY
CASTS, UR, LPF, POC: NEGATIVE
Crystals, Ur, HPF, POC: NEGATIVE
MUCUS UA: NEGATIVE
RBC, urine, microscopic: NEGATIVE
YEAST UA: NEGATIVE

## 2015-02-24 LAB — POCT UA - MICROALBUMIN: MICROALBUMIN (UR) POC: NEGATIVE mg/L

## 2015-02-24 MED ORDER — FUROSEMIDE 20 MG PO TABS: 20.0000 mg | ORAL_TABLET | Freq: Every day | ORAL | Status: DC

## 2015-02-24 MED ORDER — QUETIAPINE FUMARATE 50 MG PO TABS: 50.0000 mg | ORAL_TABLET | Freq: Every day | ORAL | Status: DC

## 2015-02-24 NOTE — Patient Instructions (Signed)
Great to see you again  Good job on the record keeping in diabetes  I have prescribed seroquel for help with confusion and sleep at night  Stop citalopram  I have also started lasix  Lets repeat her labs in 1 month

## 2015-02-24 NOTE — Progress Notes (Signed)
   HPI  Patient presents today for follow-up diabetes and heart failure.  Diabetes Average fasting blood sugar is 90-120 Postprandial is 150- 200 He is tolerating her medications easily, she started metformin and has reduced her insulin as discussed last time.  Heart failure Has a history of reduced ejection fraction heart failure with EF of 40-45% 2013, improved with medical management) 55-60%. He complains of some orthopnea, she also has a little bit of increase of leg swelling Her daughter is keeping very close records and she has fluctuated from 196 pounds at home down to 192 now back to 196. She is up from 198 200 on our scale.  Sleep changes, confusion She has mild dementia, over the weekend she had an night where she got up 5-6 times in the night and had some odd behaviors including walking into her daughter's room, turning a light, and stating that they needed to talk in a very different type of voice for her. She denies seeing or hearing anything that was not there, she remembers being confused but is not aware of what was motivating her activities.   PMH: Smoking status noted ROS: Per HPI  Objective: BP 131/86 mmHg  Pulse 99  Temp(Src) 97.4 F (36.3 C) (Oral)  Ht 5\' 7"  (1.702 m)  Wt 200 lb (90.719 kg)  BMI 31.32 kg/m2 Gen: NAD, alert, cooperative with exam HEENT: NCAT CV: RRR, good S1/S2, no murmur Resp: Nonlabored, mild crackles at bilateral bases Ext: 1+ mpitting edema BL LE Neuro: Alert and oriented, No gross deficits  Assessment and plan:  # CHF Recent echo with preserved EF, previously had reduced EF of 40-45% in 2013 Treating more like systolic now with daily Lasix Her daughter is to keep a weight record Annual follow-up in one month for labs Integument beta blocker, consider Ace  # Type 2 diabetes Repeat labs next month, sugars certainly improving Tolerating metformin easily, caution with this given her age  # Dementia with behavior changes Try  Seroquel at night to help her sleep  Discussed risks and benefits including risk of all cause mortality with her daughter who understands   Orders Placed This Encounter  Procedures  . POCT UA - Microalbumin  . POCT urinalysis dipstick  . POCT UA - Microscopic Only    Meds ordered this encounter  Medications  . QUEtiapine (SEROQUEL) 50 MG tablet    Sig: Take 1 tablet (50 mg total) by mouth at bedtime.    Dispense:  30 tablet    Refill:  3  . furosemide (LASIX) 20 MG tablet    Sig: Take 1 tablet (20 mg total) by mouth daily.    Dispense:  30 tablet    Refill:  [place], MD [facility] 02/24/2015, 4:06 PM

## 2015-02-24 NOTE — Telephone Encounter (Signed)
Insurance prior authorized Xarelto through 02/24/16

## 2015-03-02 ENCOUNTER — Other Ambulatory Visit: Payer: Self-pay | Admitting: Family Medicine

## 2015-03-04 MED ORDER — HUMULIN 70/30 KWIKPEN (70-30) 100 UNIT/ML ~~LOC~~ SUPN: 40.0000 [IU] | PEN_INJECTOR | Freq: Two times a day (BID) | SUBCUTANEOUS | Status: DC

## 2015-03-04 NOTE — Telephone Encounter (Signed)
done

## 2015-03-11 ENCOUNTER — Other Ambulatory Visit: Payer: Self-pay | Admitting: *Deleted

## 2015-03-11 MED ORDER — RIVAROXABAN 20 MG PO TABS: ORAL_TABLET | ORAL | Status: DC

## 2015-03-14 ENCOUNTER — Other Ambulatory Visit: Payer: Self-pay | Admitting: Family Medicine

## 2015-03-16 ENCOUNTER — Telehealth: Payer: Self-pay | Admitting: Family Medicine

## 2015-03-16 ENCOUNTER — Telehealth: Payer: Self-pay | Admitting: *Deleted

## 2015-03-16 NOTE — Telephone Encounter (Signed)
Single event after discussing with Barnett Applebaum who discussed on the phone directly. I tried to call but no answer. She could certainly be seen if this is persistent although with worsening dementia at some point these episodes may become more common.    Laroy Apple, MD [facility] 03/16/2015, 1:01 PM

## 2015-03-16 NOTE — Telephone Encounter (Signed)
Tried to call, no answer.   Will attempt again tomorrow.   Laroy Apple, MD [facility] 03/16/2015, 5:41 PM

## 2015-03-16 NOTE — Telephone Encounter (Signed)
Stp's daughter who states they were told to stop celexa and start her on Seroquel only 25 mg at night. Pt's daughter [name] thinks mom is having increased depression, seems to be more tearful and she went outside and fell down the hill. Should we change anything, please advise.

## 2015-03-16 NOTE — Telephone Encounter (Signed)
FYI - pt has become more confused and very tearful. She wandered outside and fell, no injuries. She has an appt 03/23/15

## 2015-03-17 NOTE — Telephone Encounter (Signed)
Called and discussed recent events with Ms Dorothyann Peng, her daughter  She went outside and fell hurting her great toe. Then she had sleep disturbance and crying spells.   Has seemed to be more depressed over the last week with some crying.   She has been walking on the toe but does have bruising.   I recommended careful watching, she may need to come in tomorrow if she has another restless night or continues to protect her toe to r/o fracture.  I explained it is ok to go up on seroquel to 50 mg for sleep and agitation woith dementia  Ok to add back celexa if need to but i am more cautious with her Qtc of 500 of her being on seroquel and celexa. If needed would try to keep doses minimal, 25 seroquel, 10 celexa.   She will all in for triage appt tomorrow if ttoe pain seem st o be persistent.   Laroy Apple, MD [facility] 03/17/2015, 12:22 PM

## 2015-03-23 ENCOUNTER — Telehealth: Payer: Self-pay | Admitting: *Deleted

## 2015-03-23 NOTE — Telephone Encounter (Signed)
Ok to see tomorrow unless symptomatic for CHF.   Laroy Apple, MD [facility] 03/23/2015, 2:03 PM

## 2015-03-23 NOTE — Telephone Encounter (Signed)
Patient wt. Was 198 last Monday, 205.5 today. Pt has an appt with you tomorrow

## 2015-03-24 ENCOUNTER — Ambulatory Visit (INDEPENDENT_AMBULATORY_CARE_PROVIDER_SITE_OTHER): Payer: Medicare Other

## 2015-03-24 ENCOUNTER — Ambulatory Visit (INDEPENDENT_AMBULATORY_CARE_PROVIDER_SITE_OTHER): Payer: Medicare Other | Admitting: Family Medicine

## 2015-03-24 ENCOUNTER — Encounter: Payer: Self-pay | Admitting: Family Medicine

## 2015-03-24 VITALS — BP 130/91 | HR 86 | Temp 97.0°F | Ht 67.0 in | Wt 207.6 lb

## 2015-03-24 DIAGNOSIS — F0391 Unspecified dementia with behavioral disturbance: Secondary | ICD-10-CM

## 2015-03-24 DIAGNOSIS — M79675 Pain in left toe(s): Secondary | ICD-10-CM

## 2015-03-24 DIAGNOSIS — F03918 Unspecified dementia, unspecified severity, with other behavioral disturbance: Secondary | ICD-10-CM

## 2015-03-24 DIAGNOSIS — I4891 Unspecified atrial fibrillation: Secondary | ICD-10-CM

## 2015-03-24 DIAGNOSIS — E118 Type 2 diabetes mellitus with unspecified complications: Secondary | ICD-10-CM

## 2015-03-24 LAB — POCT GLYCOSYLATED HEMOGLOBIN (HGB A1C): Hemoglobin A1C: 7.1

## 2015-03-24 MED ORDER — TRAZODONE HCL 50 MG PO TABS: 50.0000 mg | ORAL_TABLET | Freq: Every evening | ORAL | Status: DC | PRN

## 2015-03-24 MED ORDER — POTASSIUM CHLORIDE ER 10 MEQ PO TBCR: EXTENDED_RELEASE_TABLET | ORAL | Status: DC

## 2015-03-24 MED ORDER — DIGOXIN 125 MCG PO TABS: 0.1250 mg | ORAL_TABLET | Freq: Every day | ORAL | Status: DC

## 2015-03-24 MED ORDER — FUROSEMIDE 40 MG PO TABS: 40.0000 mg | ORAL_TABLET | Freq: Every day | ORAL | Status: DC

## 2015-03-24 NOTE — Patient Instructions (Signed)
Great to see you!  She is doing very good, Wear shoes at home  Try trazodone for sleep.   We will let you know her labs within 1 week.

## 2015-03-24 NOTE — Progress Notes (Signed)
   HPI  Patient presents today for follow-up diabetes, insomnia, depression, and left toe pain.  Diabetes Good medication compliance, using metformin and 40 units of 70/30 twice daily Average fasting blood sugars are in the 130s Average 1 hr postprandial is 200- 220 No hypoglycemia  Fall, toe pain She fell 1 week ago and has had L great to pain since that time.  She states its improving slightly She is still walking but states it hurts more.   Depression She had stopped celexa and started seroquel, she slept better but had crying spells.  She stopped Seroquel endocrine spells stop. They have restarted Celexa. The Celexa was started during nursing home stay.  PMH: Smoking status noted ROS: Per HPI  Objective: BP 130/91 mmHg  Pulse 86  Temp(Src) 97 F (36.1 C) (Oral)  Ht 5\' 7"  (1.702 m)  Wt 207 lb 9.6 oz (94.167 kg)  BMI 32.51 kg/m2 Gen: NAD, alert, cooperative with exam HEENT: NCAT CV: RRR, good S1/S2, no murmur Resp: CTABL, no wheezes, non-labored Ext: No edema, warm Neuro: Alert and oriented, No gross deficits  Msk: Left foot with some tenderness to palpation of MTP, also some tenderness with flexion of the right toe as well as abduction of the great toe, minimal swelling and erythema  Plain film of the left foot: Status post left second ray amputation, no acute fracture  Assessment and plan:  # Left great toe injury Considering this left great toe sprain, there is no evidence of fracture on the x-ray today Encouraged her to wear shoes all the time around the house, I offered I would she but they would like to try stiff shoes instead.  # Diabetes Improved, A1c changing from 14 to 7.1 No hypoglycemia Continue 70/30 34 units and metformin CMP, low threshold for stopping metformin  # A. fib Rate controlled today, no complaints She never increased her digoxin, she's taking 125 g daily  # Insomnia, depression, dementia with behavioral disturbance Celexa  restarted I suspect that rather then depression from removal of Celexa she actually had depression from Seroquel. Discontinue Seroquel Trial of trazodone for sleep, still there some QT prolongation risk, we discussed this     Orders Placed This Encounter  Procedures  . DG Foot Complete Left    Standing Status: Future     Number of Occurrences:      Standing Expiration Date: 05/23/2016    Order Specific Question:  Reason for Exam (SYMPTOM  OR DIAGNOSIS REQUIRED)    Answer:  r/o fracture    Order Specific Question:  Preferred imaging location?    Answer:  Internal  . POCT glycosylated hemoglobin (Hb A1C)    Meds ordered this encounter  Medications  . citalopram (CELEXA) 20 MG tablet    Sig: Take 20 mg by mouth daily.    Laroy Apple, MD [facility] 03/24/2015, 4:21 PM

## 2015-03-25 LAB — COMPREHENSIVE METABOLIC PANEL
ALBUMIN: 3.7 g/dL (ref 3.5–4.8)
ALK PHOS: 66 IU/L (ref 39–117)
ALT: 21 IU/L (ref 0–32)
AST: 16 IU/L (ref 0–40)
Albumin/Globulin Ratio: 1 — ABNORMAL LOW (ref 1.1–2.5)
BUN/Creatinine Ratio: 20 (ref 11–26)
BUN: 20 mg/dL (ref 8–27)
Bilirubin Total: 0.3 mg/dL (ref 0.0–1.2)
CALCIUM: 10.5 mg/dL — AB (ref 8.7–10.3)
CO2: 25 mmol/L (ref 18–29)
CREATININE: 0.99 mg/dL (ref 0.57–1.00)
Chloride: 100 mmol/L (ref 97–106)
GFR calc Af Amer: 64 mL/min/{1.73_m2} (ref >59)
GFR, EST NON AFRICAN AMERICAN: 56 mL/min/{1.73_m2} — AB (ref >59)
GLUCOSE: 147 mg/dL — AB (ref 65–99)
Globulin, Total: 3.8 g/dL (ref 1.5–4.5)
Potassium: 4.7 mmol/L (ref 3.5–5.2)
Sodium: 140 mmol/L (ref 136–144)
Total Protein: 7.5 g/dL (ref 6.0–8.5)

## 2015-03-25 LAB — DIGOXIN LEVEL: DIGOXIN LVL: 0.7 ng/mL — AB (ref 0.9–2.0)

## 2015-04-01 ENCOUNTER — Ambulatory Visit (INDEPENDENT_AMBULATORY_CARE_PROVIDER_SITE_OTHER): Payer: Medicare Other | Admitting: Family Medicine

## 2015-04-01 DIAGNOSIS — I5032 Chronic diastolic (congestive) heart failure: Secondary | ICD-10-CM | POA: Diagnosis not present

## 2015-04-01 DIAGNOSIS — E119 Type 2 diabetes mellitus without complications: Secondary | ICD-10-CM

## 2015-04-01 DIAGNOSIS — F0390 Unspecified dementia without behavioral disturbance: Secondary | ICD-10-CM

## 2015-04-01 DIAGNOSIS — I4891 Unspecified atrial fibrillation: Secondary | ICD-10-CM

## 2015-04-06 ENCOUNTER — Other Ambulatory Visit: Payer: Self-pay | Admitting: *Deleted

## 2015-04-06 ENCOUNTER — Telehealth: Payer: Self-pay | Admitting: Family Medicine

## 2015-04-06 NOTE — Telephone Encounter (Signed)
lmovm - there is a prescription for Lasix 40 mg at [facility], they will fill it & have it ready for the patient.

## 2015-04-13 ENCOUNTER — Other Ambulatory Visit: Payer: Self-pay | Admitting: Family Medicine

## 2015-04-21 ENCOUNTER — Ambulatory Visit: Payer: Medicare Other | Admitting: Family Medicine

## 2015-05-19 ENCOUNTER — Ambulatory Visit (INDEPENDENT_AMBULATORY_CARE_PROVIDER_SITE_OTHER): Payer: Medicare Other | Admitting: Family Medicine

## 2015-05-19 ENCOUNTER — Encounter: Payer: Self-pay | Admitting: Family Medicine

## 2015-05-19 VITALS — BP 117/73 | HR 82 | Temp 97.7°F | Ht 67.0 in | Wt 202.6 lb

## 2015-05-19 DIAGNOSIS — F03918 Unspecified dementia, unspecified severity, with other behavioral disturbance: Secondary | ICD-10-CM

## 2015-05-19 DIAGNOSIS — I8312 Varicose veins of left lower extremity with inflammation: Secondary | ICD-10-CM

## 2015-05-19 DIAGNOSIS — L03115 Cellulitis of right lower limb: Secondary | ICD-10-CM

## 2015-05-19 DIAGNOSIS — L0390 Cellulitis, unspecified: Secondary | ICD-10-CM | POA: Insufficient documentation

## 2015-05-19 DIAGNOSIS — I878 Other specified disorders of veins: Secondary | ICD-10-CM | POA: Diagnosis not present

## 2015-05-19 DIAGNOSIS — I872 Venous insufficiency (chronic) (peripheral): Secondary | ICD-10-CM | POA: Insufficient documentation

## 2015-05-19 DIAGNOSIS — I8311 Varicose veins of right lower extremity with inflammation: Secondary | ICD-10-CM

## 2015-05-19 DIAGNOSIS — F0391 Unspecified dementia with behavioral disturbance: Secondary | ICD-10-CM

## 2015-05-19 MED ORDER — DOXYCYCLINE HYCLATE 100 MG PO TABS: 100.0000 mg | ORAL_TABLET | Freq: Two times a day (BID) | ORAL | Status: DC

## 2015-05-19 MED ORDER — TRIAMCINOLONE ACETONIDE 0.1 % EX CREA: 1.0000 "application " | TOPICAL_CREAM | Freq: Two times a day (BID) | CUTANEOUS | Status: DC

## 2015-05-19 NOTE — Progress Notes (Signed)
   HPI  Patient presents today today to discuss leg itching, sore on her foot, and cough  She complains of cough and sore throat for 1 day duration. She denies dyspnea, she feels well overall Her daughter was sick over the last 4-5 days but is resolving now.  Sore on the foot Has been present for about one week She's been complaining of pain in that area, it's tender to the touch She's walking at baseline without limitation  Leg itching and swelling Her daughter complains of mild to moderate swelling in her bilateral lower extremities over the last month or so She's been complaining of intense itching which is helped by hydrocortisone but not completely improved.  They are doing well managing her dementia and medical problems in the house, she has a sitter during the day She's been resting for throughout most of the day and staying up at night wandering around the house, however she's not causing any problems and has not seemed to be a danger to herself.  PMH: Smoking status noted ROS: Per HPI  Objective: BP 117/73 mmHg  Pulse 82  Temp(Src) 97.7 F (36.5 C) (Oral)  Ht 5\' 7"  (1.702 m)  Wt 202 lb 9.6 oz (91.899 kg)  BMI 31.72 kg/m2 Gen: NAD, alert, cooperative with exam HEENT: NCAT, pharynx erythematous, TMs normal bilaterally, nares clear CV: RRR, good S1/S2, no murmur Resp: Nonlabored, crackles at bilateral bases Abd: SNTND, BS present, no guarding or organomegaly Ext: 1+ pitting edema bilateral lower extremities Neuro: Alert and oriented to person, not to place or time she believed it was Sunday in November and that she was in [place], it is Tuesday in January and she is in [place] exam 2+ dorsalis pedis pulses bilaterally No lesions on the left Right foot with approximately 4-5 cm erythematous circular lesion with central lesion that's hyperkeratotic and tender to the touch that's approximately 1 cm in diameter, no significant warmth or induration Second toe amputated  bilaterally with inward directed great toe  Sensation intact on left plantar foot but decreased on the medial edge and heel of the right  Assessment and plan:  # Cellulitis, diabetic foot wound Treating with doxycycline, erythema and tenderness with not much fluctuance or induration. Recommended yogurt daily while on doxycycline Also with cough and some mild rales this will cover possible pneumonia  # Venous stasis, venous stasis dermatitis Kenalog cream for venous stasis dermatitis Discussed compression stockings and given Rx for venous stasis  # Dementia No major behavioral issues right now Her sleep schedule is off Trazodone not really helping Continue Celexa as the family feels she is much brighter on it   Meds ordered this encounter  Medications  . triamcinolone cream (KENALOG) 0.1 %    Sig: Apply 1 application topically 2 (two) times daily.    Dispense:  80 g    Refill:  0  . doxycycline (VIBRA-TABS) 100 MG tablet    Sig: Take 1 tablet (100 mg total) by mouth 2 (two) times daily. 1 po bid    Dispense:  20 tablet    Refill:  0    Laroy Apple, MD [facility] 05/19/2015, 4:28 PM

## 2015-05-19 NOTE — Patient Instructions (Signed)
Great to see you!  You are doing a great job working with your mother  She has venous stasis, the stockings will help this The itchy red areas are venous stasis dermatitis, the kenalog cream will help this  Avoid the wounds with steroid cream, use vaseline as needed with bandages  I am also treating her for cellulitis, a skin infection with doxycycline. A yogurt a day is a good idea while on this medicine.   Cellulitis Cellulitis is an infection of the skin and the tissue beneath it. The infected area is usually red and tender. Cellulitis occurs most often in the arms and lower legs.  CAUSES  Cellulitis is caused by bacteria that enter the skin through cracks or cuts in the skin. The most common types of bacteria that cause cellulitis are staphylococci and streptococci. SIGNS AND SYMPTOMS   Redness and warmth.  Swelling.  Tenderness or pain.  Fever. DIAGNOSIS  Your health care provider can usually determine what is wrong based on a physical exam. Blood tests may also be done. TREATMENT  Treatment usually involves taking an antibiotic medicine. HOME CARE INSTRUCTIONS   Take your antibiotic medicine as directed by your health care provider. Finish the antibiotic even if you start to feel better.  Keep the infected arm or leg elevated to reduce swelling.  Apply a warm cloth to the affected area up to 4 times per day to relieve pain.  Take medicines only as directed by your health care provider.  Keep all follow-up visits as directed by your health care provider. SEEK MEDICAL CARE IF:   You notice red streaks coming from the infected area.  Your red area gets larger or turns dark in color.  Your bone or joint underneath the infected area becomes painful after the skin has healed.  Your infection returns in the same area or another area.  You notice a swollen bump in the infected area.  You develop new symptoms.  You have a fever. SEEK IMMEDIATE MEDICAL CARE IF:    You feel very sleepy.  You develop vomiting or diarrhea.  You have a general ill feeling (malaise) with muscle aches and pains.   This information is not intended to replace advice given to you by your health care provider. Make sure you discuss any questions you have with your health care provider.   Document Released: 01/26/2005 Document Revised: 01/07/2015 Document Reviewed: 07/04/2011 Elsevier Interactive Patient Education [employer].

## 2015-05-28 ENCOUNTER — Other Ambulatory Visit: Payer: Self-pay | Admitting: Family Medicine

## 2015-06-09 ENCOUNTER — Other Ambulatory Visit: Payer: Self-pay | Admitting: Family Medicine

## 2015-06-17 ENCOUNTER — Telehealth: Payer: Self-pay | Admitting: Internal Medicine

## 2015-06-17 NOTE — Telephone Encounter (Signed)
RECALL FOR TCS °

## 2015-06-18 NOTE — Telephone Encounter (Signed)
Letter mailed to pt.  

## 2015-06-26 ENCOUNTER — Other Ambulatory Visit: Payer: Self-pay | Admitting: Family Medicine

## 2015-06-30 ENCOUNTER — Ambulatory Visit (INDEPENDENT_AMBULATORY_CARE_PROVIDER_SITE_OTHER): Payer: Medicare Other | Admitting: Family Medicine

## 2015-06-30 ENCOUNTER — Encounter: Payer: Self-pay | Admitting: Family Medicine

## 2015-06-30 VITALS — BP 123/78 | HR 78 | Temp 97.0°F | Ht 67.0 in | Wt 208.0 lb

## 2015-06-30 DIAGNOSIS — E118 Type 2 diabetes mellitus with unspecified complications: Secondary | ICD-10-CM | POA: Diagnosis not present

## 2015-06-30 DIAGNOSIS — I10 Essential (primary) hypertension: Secondary | ICD-10-CM | POA: Diagnosis not present

## 2015-06-30 DIAGNOSIS — Z794 Long term (current) use of insulin: Secondary | ICD-10-CM | POA: Diagnosis not present

## 2015-06-30 DIAGNOSIS — I4891 Unspecified atrial fibrillation: Secondary | ICD-10-CM

## 2015-06-30 LAB — POCT GLYCOSYLATED HEMOGLOBIN (HGB A1C): HEMOGLOBIN A1C: 7.3

## 2015-06-30 MED ORDER — METOPROLOL TARTRATE 100 MG PO TABS: 100.0000 mg | ORAL_TABLET | Freq: Two times a day (BID) | ORAL | Status: DC

## 2015-06-30 MED ORDER — TRIAMCINOLONE ACETONIDE 0.5 % EX OINT: 1.0000 "application " | TOPICAL_OINTMENT | Freq: Two times a day (BID) | CUTANEOUS | Status: DC

## 2015-06-30 NOTE — Patient Instructions (Addendum)
Great to see you!  I have sent a stronger ointment, use it for no more than 14 days consecutively and then give it at least 2 weeks off between uses  Lets see her again in 3 months  Consider melatonin 3 to 5 mg 30 min to 1 hour before bed

## 2015-06-30 NOTE — Progress Notes (Signed)
   HPI  Patient presents today for follow-up diabetes, hypertension, and dementia.  Dementia She's been a bit more forgetful lately.  Sleeping ok with trazodone  DM2 God compliance, 34 units BID No hypoglycemia Avg fasting 150-180 Tolerating metformin well  Persistent rash on lower legs, improved with kenalog ointment  PMH: Smoking status noted ROS: Per HPI  Objective: BP 123/78 mmHg  Pulse 99  Temp(Src) 97 F (36.1 C) (Oral)  Ht '5\' 7"'$  (1.702 m)  Wt 208 lb (94.348 kg)  BMI 32.57 kg/m2 Gen: NAD, alert, cooperative with exam HEENT: NCAT CV: RRR, good S1/S2, no murmur Resp: soft crackles Bl bases, non-labored Ext: Trace pitting edema bilaterally, erythema and a few scattered papules on bilateral lower legs Neuro: Alert and oriented, No gross deficits  Diabetic Foot Exam - Simple   Simple Foot Form  Visual Inspection  See comments:  Yes  Sensation Testing  See comments:  Yes  Pulse Check  Posterior Tibialis and Dorsalis pulse intact bilaterally:  Yes  Comments  Sensation absent on left plantar surface to monofilament, normal on right Bilateral second toe removed       Assessment and plan:  # Stasis dermatitis Increase triamcinolone concentration to 0.5%  # Hypertension Doing well No changes  # A fib Rate controlled on Metop and Dig Dig level  # DM2 A1C 7.3, considering age and dementia she is at goal of less than 8 Continue 70/30 and metformin Q 3 month creatinine checks with reduced renal function and metformin CMP Refer to optho DM foot exam documented    Orders Placed This Encounter  Procedures  . CMP14+EGFR  . Digitoxin level  . POCT glycosylated hemoglobin (Hb A1C)    Meds ordered this encounter  Medications  . triamcinolone ointment (KENALOG) 0.5 %    Sig: Apply 1 application topically 2 (two) times daily.    Dispense:  30 g    Refill:  0  . metoprolol (LOPRESSOR) 100 MG tablet    Sig: Take 1 tablet (100 mg total) by mouth 2  (two) times daily.    Dispense:  180 tablet    Refill:  [place], MD [facility] 06/30/2015, 4:16 PM

## 2015-07-01 LAB — CMP14+EGFR
ALBUMIN: 3.5 g/dL (ref 3.5–4.8)
ALT: 13 IU/L (ref 0–32)
AST: 15 IU/L (ref 0–40)
Albumin/Globulin Ratio: 0.9 — ABNORMAL LOW (ref 1.1–2.5)
Alkaline Phosphatase: 58 IU/L (ref 39–117)
BUN/Creatinine Ratio: 16 (ref 11–26)
BUN: 15 mg/dL (ref 8–27)
Bilirubin Total: 0.2 mg/dL (ref 0.0–1.2)
CO2: 23 mmol/L (ref 18–29)
Calcium: 9.5 mg/dL (ref 8.7–10.3)
Chloride: 98 mmol/L (ref 96–106)
Creatinine, Ser: 0.95 mg/dL (ref 0.57–1.00)
GFR, EST AFRICAN AMERICAN: 68 mL/min/{1.73_m2} (ref >59)
GFR, EST NON AFRICAN AMERICAN: 59 mL/min/{1.73_m2} — AB (ref >59)
GLUCOSE: 159 mg/dL — AB (ref 65–99)
Globulin, Total: 3.8 g/dL (ref 1.5–4.5)
POTASSIUM: 4.6 mmol/L (ref 3.5–5.2)
SODIUM: 139 mmol/L (ref 134–144)
Total Protein: 7.3 g/dL (ref 6.0–8.5)

## 2015-07-01 LAB — DIGOXIN LEVEL: DIGOXIN, SERUM: 0.9 ng/mL (ref 0.5–0.9)

## 2015-07-06 ENCOUNTER — Other Ambulatory Visit: Payer: Self-pay | Admitting: Family Medicine

## 2015-07-27 ENCOUNTER — Other Ambulatory Visit: Payer: Self-pay | Admitting: Family Medicine

## 2015-07-27 ENCOUNTER — Telehealth: Payer: Self-pay | Admitting: Family Medicine

## 2015-07-27 NOTE — Telephone Encounter (Signed)
Pt has appt scheduled with Wendi Snipes 3/29.

## 2015-07-29 ENCOUNTER — Encounter: Payer: Self-pay | Admitting: Family Medicine

## 2015-07-29 ENCOUNTER — Ambulatory Visit (INDEPENDENT_AMBULATORY_CARE_PROVIDER_SITE_OTHER): Payer: Medicare Other | Admitting: Family Medicine

## 2015-07-29 VITALS — BP 110/71 | HR 87 | Temp 96.7°F | Ht 67.0 in | Wt 209.4 lb

## 2015-07-29 DIAGNOSIS — L03115 Cellulitis of right lower limb: Secondary | ICD-10-CM | POA: Diagnosis not present

## 2015-07-29 MED ORDER — MUPIROCIN 2 % EX OINT: 1.0000 "application " | TOPICAL_OINTMENT | Freq: Two times a day (BID) | CUTANEOUS | Status: DC

## 2015-07-29 MED ORDER — SULFAMETHOXAZOLE-TRIMETHOPRIM 800-160 MG PO TABS: 1.0000 | ORAL_TABLET | Freq: Two times a day (BID) | ORAL | Status: DC

## 2015-07-29 NOTE — Patient Instructions (Signed)
  Great to see you!  Take all of the antibiotics, I think they will work well for her sore Cover it in mupirocin ointment twice daily for 1 week   Cellulitis Cellulitis is an infection of the skin and the tissue beneath it. The infected area is usually red and tender. Cellulitis occurs most often in the arms and lower legs.  CAUSES  Cellulitis is caused by bacteria that enter the skin through cracks or cuts in the skin. The most common types of bacteria that cause cellulitis are staphylococci and streptococci. SIGNS AND SYMPTOMS   Redness and warmth.  Swelling.  Tenderness or pain.  Fever. DIAGNOSIS  Your health care provider can usually determine what is wrong based on a physical exam. Blood tests may also be done. TREATMENT  Treatment usually involves taking an antibiotic medicine. HOME CARE INSTRUCTIONS   Take your antibiotic medicine as directed by your health care provider. Finish the antibiotic even if you start to feel better.  Keep the infected arm or leg elevated to reduce swelling.  Apply a warm cloth to the affected area up to 4 times per day to relieve pain.  Take medicines only as directed by your health care provider.  Keep all follow-up visits as directed by your health care provider. SEEK MEDICAL CARE IF:   You notice red streaks coming from the infected area.  Your red area gets larger or turns dark in color.  Your bone or joint underneath the infected area becomes painful after the skin has healed.  Your infection returns in the same area or another area.  You notice a swollen bump in the infected area.  You develop new symptoms.  You have a fever. SEEK IMMEDIATE MEDICAL CARE IF:   You feel very sleepy.  You develop vomiting or diarrhea.  You have a general ill feeling (malaise) with muscle aches and pains.   This information is not intended to replace advice given to you by your health care provider. Make sure you discuss any questions you  have with your health care provider.   Document Released: 01/26/2005 Document Revised: 01/07/2015 Document Reviewed: 07/04/2011 Elsevier Interactive Patient Education [employer].

## 2015-07-29 NOTE — Progress Notes (Signed)
   HPI  Patient presents today here with concern for leg infection  Her daughter explains that the lesion has been there for about 2 days, draining clear fluid. The patient complains of lower leg pain on that leg. Her venous stasis dermatitis seems to be helped but not completely controlled by topical steroids. She seems to be developing persistent ruddiness of the feet Laterally, although this is not acute and she only has right lower extremity pain.  He's taking insulin like usual. Her daughter has stopped putting steroids around the area of concern.  PMH: Smoking status noted ROS: Per HPI  Objective: BP 110/71 mmHg  Pulse 87  Temp(Src) 96.7 F (35.9 C) (Oral)  Ht 5\' 7"  (1.702 m)  Wt 209 lb 6.4 oz (94.983 kg)  BMI 32.79 kg/m2 Gen: NAD, alert, cooperative with exam HEENT: NCAT CV: RRR, good S1/S2, no murmur Resp: CTABL, no wheezes, non-labored Ext: No edema, warm Neuro: Alert and oriented, No gross deficits  Skin: macular papular erythematous rash on bilateral lower extremities from the mid calfto the feet, Posterior right lower With 11 mm in diameter circular ulceration with thin red rim and no clear induration warmth, or other erythema Patient complains of tenderness with palpation Producing clear fluid, none is expressed with palpation  Assessment and plan:  # Ulceration and cellulitis Treat with bactrim X 7 days C/o pain of that leg so treating as cellulitis, with clear exudate I doubt staph Also possbly just exudate from venous stasis Recommend seroid ointment (avoiding lesion) and stiockings for venous stasis Mupirocin for lesion RTC with any concerns or failure to heal as expected    Meds ordered this encounter  Medications  . sulfamethoxazole-trimethoprim (BACTRIM DS) 800-160 MG tablet    Sig: Take 1 tablet by mouth 2 (two) times daily.    Dispense:  14 tablet    Refill:  0  . mupirocin ointment (BACTROBAN) 2 %    Sig: Place 1 application into the nose  2 (two) times daily.    Dispense:  22 g    Refill:  [place], MD [facility] 07/29/2015, 10:04 AM

## 2015-08-07 ENCOUNTER — Other Ambulatory Visit: Payer: Self-pay | Admitting: Family Medicine

## 2015-08-07 NOTE — Telephone Encounter (Signed)
Last seen 06/30/2015.

## 2015-08-12 ENCOUNTER — Other Ambulatory Visit: Payer: Self-pay | Admitting: Family Medicine

## 2015-08-13 NOTE — Telephone Encounter (Signed)
Left detailed message stating requested antibiotic refill was denied as pt would ntbs and to CB to schedule and appt.

## 2015-08-17 ENCOUNTER — Telehealth: Payer: Self-pay | Admitting: Family Medicine

## 2015-08-17 MED ORDER — SULFAMETHOXAZOLE-TRIMETHOPRIM 800-160 MG PO TABS: 1.0000 | ORAL_TABLET | Freq: Two times a day (BID) | ORAL | Status: DC

## 2015-08-17 NOTE — Telephone Encounter (Signed)
Spoke with daughter and she states the spot on her mothers right leg is still draining fluid. Daughter states it has not improved after finishing bactrim that was prescribed 3/29. Daughter would like to know what the next step is. FYI they are coming in for Home Health face to face Monday May 1st. Please advise.

## 2015-08-17 NOTE — Telephone Encounter (Signed)
Called and discussed with Ms. Christine [name].   The wound seemed to improve slightly on bactrim but retreted after it was finished. Will attempt longer course and follow up in 2 weeks  She ahs appt.   Understands reasons to seek care.   Laroy Apple, MD [facility] 08/17/2015, 5:20 PM

## 2015-08-31 ENCOUNTER — Encounter (INDEPENDENT_AMBULATORY_CARE_PROVIDER_SITE_OTHER): Payer: Self-pay

## 2015-08-31 ENCOUNTER — Ambulatory Visit (INDEPENDENT_AMBULATORY_CARE_PROVIDER_SITE_OTHER): Payer: Medicare Other | Admitting: Family Medicine

## 2015-08-31 ENCOUNTER — Encounter: Payer: Self-pay | Admitting: Family Medicine

## 2015-08-31 VITALS — BP 123/71 | HR 88 | Temp 97.0°F | Ht 67.0 in | Wt 209.4 lb

## 2015-08-31 DIAGNOSIS — F03918 Unspecified dementia, unspecified severity, with other behavioral disturbance: Secondary | ICD-10-CM

## 2015-08-31 DIAGNOSIS — I83019 Varicose veins of right lower extremity with ulcer of unspecified site: Secondary | ICD-10-CM

## 2015-08-31 DIAGNOSIS — R262 Difficulty in walking, not elsewhere classified: Secondary | ICD-10-CM | POA: Diagnosis not present

## 2015-08-31 DIAGNOSIS — F0391 Unspecified dementia with behavioral disturbance: Secondary | ICD-10-CM

## 2015-08-31 NOTE — Progress Notes (Addendum)
HPI  Patient presents today here to follow-up for leg wound and discussed the possibility of home health.  Difficulty walking, dementia Patient has difficulty walking especially getting up and down the stairs. Her daughter has a very difficult time with bathing. She is also struggling to take care of the stasis ulcer that she has.  The stasis ulcer has improved, previously it had erythema and warmth in severe drainage concerning for cellulitis. It has dried up with Bactrim but has not gotten any smaller. She denies any fever, chills, sweats. She has normal food and fluid intake.  She has not had any recent falls. It's very difficult for her to bring her out of the house without any assistance  PMH: Smoking status noted ROS: Per HPI  Objective: BP 123/71 mmHg  Pulse 88  Temp(Src) 97 F (36.1 C) (Oral)  Ht 5\' 7"  (1.702 m)  Wt 209 lb 6.4 oz (94.983 kg)  BMI 32.79 kg/m2 Gen: NAD, alert, cooperative with exam HEENT: NCAT CV: RRR, good S1/S2, no murmur Resp: CTABL, no wheezes, non-labored Ext: 1-2+ pitting edema bilaterally, hemosiderin staining bilaterally, 12 mm x 15 mm shallow ulceration on the right posterior leg with a thin erythematous region, no induration, warmth, or drainage today Neuro: Alert and oriented, No gross deficits  Diabetic Foot Exam - Simple   Simple Foot Form  Visual Inspection  No deformities, no ulcerations, no other skin breakdown bilaterally:  Yes  Sensation Testing  Intact to touch and monofilament testing bilaterally:  Yes  Pulse Check  Posterior Tibialis and Dorsalis pulse intact bilaterally:  Yes  Comments      Assessment and plan:  # Leg wound, likely Stasis ulcer Recommended wound care center evaluation It has dried up with Bactrim, however has not improved any. Discussed red flags and reasons to return sooner than planned  # Dementia, difficulty walking Considering her wound, dementia, and her difficulty getting in and out of the house  as well as around the house I have ordered home health. RN for wound care and assessment Physical therapy for strength training and home safety evaluation     Orders Placed This Encounter  Procedures  . AMB referral to wound care center    Referral Priority:  Routine    Referral Type:  Consultation    Number of Visits Requested:  1  . Home Health    Order Specific Question:  To provide the following care/treatments    Answer:  PT    Order Specific Question:  To provide the following care/treatments    Answer:  RN  . Face-to-face encounter (required for Medicare/Medicaid patients)    I Kenn File certify that this patient is under my care and that I, or a nurse practitioner or physician's assistant working with me, had a face-to-face encounter that meets the physician face-to-face encounter requirements with this patient on 08/31/2015. The encounter with the patient was in whole, or in part for the following medical condition(s) which is the primary reason for home health care (List medical condition): DM2, Stasis ulcer, Dementia    Order Specific Question:  The encounter with the patient was in whole, or in part, for the following medical condition, which is the primary reason for home health care    Answer:  stasic ulcer, DM2, Dementia    Order Specific Question:  I certify that, based on my findings, the following services are medically necessary home health services    Answer:  Nursing    Order  Specific Question:  I certify that, based on my findings, the following services are medically necessary home health services    Answer:  Physical therapy    Order Specific Question:  Reason for Medically Necessary Home Health Services    Answer:  Skilled Nursing- Skilled Assessment/Observation    Order Specific Question:  Reason for Medically Necessary Home Health Services    Answer:  Skilled Nursing- Complex Wound Care    Order Specific Question:  Reason for Medically Necessary Home  Health Services    Answer:  Therapy- Instruction on use of Assistive Device for Ambulation on all Surfaces    Order Specific Question:  Reason for Medically Necessary Home Health Services    Answer:  Therapy- Therapeutic Exercises to Increase Strength and Endurance    Order Specific Question:  Reason for Medically Necessary Home Health Services    Answer:  Therapy- Home Adaptation to Facilitate Safety    Order Specific Question:  My clinical findings support the need for the above services    Answer:  Unable to leave home safely without assistance and/or assistive device    Order Specific Question:  Further, I certify that my clinical findings support that this patient is homebound due to:    Answer:  Unable to leave home safely without assistance    Laroy Apple, MD [facility] 08/31/2015, 5:08 PM

## 2015-08-31 NOTE — Patient Instructions (Addendum)
Great to see you!  I am sending home health out  I am also sending a referral to wound care

## 2015-09-02 ENCOUNTER — Other Ambulatory Visit: Payer: Self-pay | Admitting: Family Medicine

## 2015-09-15 ENCOUNTER — Encounter (HOSPITAL_BASED_OUTPATIENT_CLINIC_OR_DEPARTMENT_OTHER): Payer: Medicare Other | Attending: Surgery

## 2015-09-22 ENCOUNTER — Other Ambulatory Visit: Payer: Self-pay | Admitting: Family Medicine

## 2015-09-29 ENCOUNTER — Encounter (INDEPENDENT_AMBULATORY_CARE_PROVIDER_SITE_OTHER): Payer: Self-pay

## 2015-09-29 ENCOUNTER — Encounter: Payer: Self-pay | Admitting: Family Medicine

## 2015-09-29 ENCOUNTER — Ambulatory Visit (INDEPENDENT_AMBULATORY_CARE_PROVIDER_SITE_OTHER): Payer: Medicare Other | Admitting: Family Medicine

## 2015-09-29 VITALS — BP 142/87 | HR 101 | Temp 96.9°F | Ht 67.0 in | Wt 209.8 lb

## 2015-09-29 DIAGNOSIS — F0391 Unspecified dementia with behavioral disturbance: Secondary | ICD-10-CM

## 2015-09-29 DIAGNOSIS — E1142 Type 2 diabetes mellitus with diabetic polyneuropathy: Secondary | ICD-10-CM

## 2015-09-29 DIAGNOSIS — I4891 Unspecified atrial fibrillation: Secondary | ICD-10-CM

## 2015-09-29 DIAGNOSIS — E118 Type 2 diabetes mellitus with unspecified complications: Secondary | ICD-10-CM

## 2015-09-29 DIAGNOSIS — F03918 Unspecified dementia, unspecified severity, with other behavioral disturbance: Secondary | ICD-10-CM

## 2015-09-29 DIAGNOSIS — I83019 Varicose veins of right lower extremity with ulcer of unspecified site: Secondary | ICD-10-CM | POA: Diagnosis not present

## 2015-09-29 DIAGNOSIS — I10 Essential (primary) hypertension: Secondary | ICD-10-CM | POA: Diagnosis not present

## 2015-09-29 DIAGNOSIS — Z794 Long term (current) use of insulin: Secondary | ICD-10-CM | POA: Diagnosis not present

## 2015-09-29 DIAGNOSIS — E1140 Type 2 diabetes mellitus with diabetic neuropathy, unspecified: Secondary | ICD-10-CM | POA: Insufficient documentation

## 2015-09-29 LAB — BAYER DCA HB A1C WAIVED: HB A1C (BAYER DCA - WAIVED): 7.2 % — ABNORMAL HIGH (ref <7.0)

## 2015-09-29 NOTE — Patient Instructions (Addendum)
Great to see you!  We will see if we can transfer care to [place] after the initial evaluation.   Call [facility] In [place] for an appointment for her eyes.   Lets plan to see her in 3 months unless you need [place] sooner.

## 2015-09-29 NOTE — Progress Notes (Signed)
   HPI  Patient presents today here for follow-up.  Type 2 diabetes Doing well with 70/30, 34 units twice daily Her daughter is watching her diet very carefully She has not had any hypoglycemia Average fasting is 140-170 Needs a diabetic eye exam  Leg wound Continued, seems to be slightly worse Continues to drain, no improvement with antibiotics and not really any improvement with current wound dressing Continues to have edematous legs Has an appointment with wound care next week.  Hypertension, A. fib Doing well with current medications No palpitations, chest pain, shortness of breath Leg edema is stable to worse   PMH: Smoking status noted ROS: Per HPI  Objective: BP 142/87 mmHg  Pulse 101  Temp(Src) 96.9 F (36.1 C) (Oral)  Ht 5\' 7"  (1.702 m)  Wt 209 lb 12.8 oz (95.165 kg)  BMI 32.85 kg/m2 Gen: NAD, alert, cooperative with exam HEENT: NCAT, EOMI, PERRL CV: RRR, no murmur, sounded slower than 90 on my exam Resp: CTABL, no wheezes, non-labored Abd: SNTND, BS present, no guarding or organomegaly Ext: 2+ pitting edema bilateral lower extremities, extremities warm, pink, good pulses Neuro: Alert and oriented, No gross deficits  Skin Right posterior leg with central lesion measuring 1.5 cm x 1.3 cm with white exudate Area of maceration and widening of the skin measuring 6 cm x 5.5 cm, difficult to measure  Assessment and plan:  # Type 2 diabetes Well-controlled No changes to insulin dose Ophthalmology encouraged  # Venous stasis ulcer Discussed continued 1 care, seems to be getting worse Forgot to treat as I believe that her leg edema is causing worsening of the ulcer, however she's having difficulty elevating her legs and cannot tolerate compression stockings Consider Unna boots, however with an open sore that don't think this is the safest option for now Appreciate wound care management  # Major depressive disorder Doing well with citalopram, previously  stopped and she had persistent crying spells  # Dementia, 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

## 2015-09-30 ENCOUNTER — Ambulatory Visit (INDEPENDENT_AMBULATORY_CARE_PROVIDER_SITE_OTHER): Payer: Medicare Other | Admitting: Family Medicine

## 2015-09-30 DIAGNOSIS — F0390 Unspecified dementia without behavioral disturbance: Secondary | ICD-10-CM | POA: Diagnosis not present

## 2015-09-30 DIAGNOSIS — R2689 Other abnormalities of gait and mobility: Secondary | ICD-10-CM | POA: Diagnosis not present

## 2015-09-30 DIAGNOSIS — I87319 Chronic venous hypertension (idiopathic) with ulcer of unspecified lower extremity: Secondary | ICD-10-CM | POA: Diagnosis not present

## 2015-10-02 ENCOUNTER — Other Ambulatory Visit: Payer: Self-pay | Admitting: Family Medicine

## 2015-10-05 ENCOUNTER — Telehealth: Payer: Self-pay | Admitting: Family Medicine

## 2015-10-06 MED ORDER — CETIRIZINE HCL 10 MG PO TABS: 10.0000 mg | ORAL_TABLET | Freq: Every day | ORAL | Status: DC

## 2015-10-06 MED ORDER — MECLIZINE HCL 12.5 MG PO TABS: 12.5000 mg | ORAL_TABLET | Freq: Three times a day (TID) | ORAL | Status: DC | PRN

## 2015-10-06 MED ORDER — CITALOPRAM HYDROBROMIDE 20 MG PO TABS: 20.0000 mg | ORAL_TABLET | Freq: Every day | ORAL | Status: DC

## 2015-10-06 MED ORDER — PRAVASTATIN SODIUM 40 MG PO TABS: 40.0000 mg | ORAL_TABLET | Freq: Every evening | ORAL | Status: DC

## 2015-10-06 MED ORDER — METOPROLOL TARTRATE 100 MG PO TABS: 100.0000 mg | ORAL_TABLET | Freq: Two times a day (BID) | ORAL | Status: DC

## 2015-10-06 NOTE — Telephone Encounter (Signed)
Rx's sent.   Laroy Apple, MD [facility] 10/06/2015, 7:45 AM

## 2015-10-07 ENCOUNTER — Encounter (HOSPITAL_BASED_OUTPATIENT_CLINIC_OR_DEPARTMENT_OTHER): Payer: Medicare Other | Attending: Surgery

## 2015-10-07 DIAGNOSIS — E1151 Type 2 diabetes mellitus with diabetic peripheral angiopathy without gangrene: Secondary | ICD-10-CM | POA: Diagnosis not present

## 2015-10-07 DIAGNOSIS — Z7901 Long term (current) use of anticoagulants: Secondary | ICD-10-CM | POA: Insufficient documentation

## 2015-10-07 DIAGNOSIS — L97811 Non-pressure chronic ulcer of other part of right lower leg limited to breakdown of skin: Secondary | ICD-10-CM | POA: Diagnosis not present

## 2015-10-07 DIAGNOSIS — Z79899 Other long term (current) drug therapy: Secondary | ICD-10-CM | POA: Insufficient documentation

## 2015-10-07 DIAGNOSIS — F0390 Unspecified dementia without behavioral disturbance: Secondary | ICD-10-CM | POA: Diagnosis not present

## 2015-10-07 DIAGNOSIS — E11622 Type 2 diabetes mellitus with other skin ulcer: Secondary | ICD-10-CM | POA: Diagnosis not present

## 2015-10-07 DIAGNOSIS — L97211 Non-pressure chronic ulcer of right calf limited to breakdown of skin: Secondary | ICD-10-CM | POA: Insufficient documentation

## 2015-10-07 DIAGNOSIS — Z794 Long term (current) use of insulin: Secondary | ICD-10-CM | POA: Diagnosis not present

## 2015-10-07 DIAGNOSIS — I890 Lymphedema, not elsewhere classified: Secondary | ICD-10-CM | POA: Insufficient documentation

## 2015-10-07 DIAGNOSIS — E1140 Type 2 diabetes mellitus with diabetic neuropathy, unspecified: Secondary | ICD-10-CM | POA: Insufficient documentation

## 2015-10-07 DIAGNOSIS — L97511 Non-pressure chronic ulcer of other part of right foot limited to breakdown of skin: Secondary | ICD-10-CM | POA: Insufficient documentation

## 2015-10-07 DIAGNOSIS — I10 Essential (primary) hypertension: Secondary | ICD-10-CM | POA: Insufficient documentation

## 2015-10-07 DIAGNOSIS — I4891 Unspecified atrial fibrillation: Secondary | ICD-10-CM | POA: Insufficient documentation

## 2015-10-07 DIAGNOSIS — L97221 Non-pressure chronic ulcer of left calf limited to breakdown of skin: Secondary | ICD-10-CM | POA: Diagnosis not present

## 2015-10-17 ENCOUNTER — Other Ambulatory Visit: Payer: Self-pay | Admitting: Family Medicine

## 2015-10-31 ENCOUNTER — Telehealth: Payer: Self-pay | Admitting: Family Medicine

## 2015-11-02 NOTE — Telephone Encounter (Signed)
Spoke to daughter Steele Berg about mothers bilateral leg wounds. Patient was sent to the [facility] in [place] because of her leg wounds. Daughter states that they wrapped both of her legs and told her to keep on for a week and then was to follow up with them. Daughter states that the patient took the leg wraps off after only having them on for an hour. Daughter attempted to put the wraps back on but patient kept taking them off. Daughter states that the wounds are having a lot of clear drainage especially when her legs are not evaluated, which the patient won't keep her legs elevated. They also wanted home health to come out and help care for the wounds. Advised daughter that you wont be back in the office until Thursday but daughter wanted to wait to see you and not another provider. Appointment made for Friday 6/7 at 3:55 for home health for leg wounds.

## 2015-11-05 NOTE — Telephone Encounter (Signed)
Appreciate the call back.   Will wait to see her tomrrow to discuss further.   Difficult Situation, patient with dementia and difficult to treat leg wounds.   Will gladly order face to face tomorrow.   Laroy Apple, MD [facility] 11/05/2015, 7:43 AM

## 2015-11-06 ENCOUNTER — Encounter: Payer: Self-pay | Admitting: Family Medicine

## 2015-11-06 ENCOUNTER — Ambulatory Visit (INDEPENDENT_AMBULATORY_CARE_PROVIDER_SITE_OTHER): Payer: Medicare Other | Admitting: Family Medicine

## 2015-11-06 VITALS — BP 117/83 | HR 77 | Temp 97.4°F | Ht 67.0 in | Wt 205.0 lb

## 2015-11-06 DIAGNOSIS — E118 Type 2 diabetes mellitus with unspecified complications: Secondary | ICD-10-CM

## 2015-11-06 DIAGNOSIS — F0391 Unspecified dementia with behavioral disturbance: Secondary | ICD-10-CM | POA: Diagnosis not present

## 2015-11-06 DIAGNOSIS — Z794 Long term (current) use of insulin: Secondary | ICD-10-CM

## 2015-11-06 DIAGNOSIS — IMO0001 Reserved for inherently not codable concepts without codable children: Secondary | ICD-10-CM

## 2015-11-06 DIAGNOSIS — F03918 Unspecified dementia, unspecified severity, with other behavioral disturbance: Secondary | ICD-10-CM

## 2015-11-06 DIAGNOSIS — I83019 Varicose veins of right lower extremity with ulcer of unspecified site: Secondary | ICD-10-CM | POA: Diagnosis not present

## 2015-11-06 NOTE — Progress Notes (Signed)
   HPI  Patient presents today here with leg wounds   Her daughter explains that she was seen at wound care center, they placed a dressing that she was supposed to wear for several days, the patient removed it after one hour.  She continues to have copious drainage from both legs, however right greater than left. She denies any fever, chills, sweats.  She's tolerating food and fluids normally.  Her daughters to the best she can to get the patient to elevate her legs and maintain dressings, however due to her dementia she forgets after 5-10 minutes and puts her legs down or takes the dressings off.  PMH: Smoking status noted ROS: Per HPI  Objective: BP 117/83 mmHg  Pulse 77  Temp(Src) 97.4 F (36.3 C) (Oral)  Ht 5\' 7"  (1.702 m)  Wt 205 lb (92.987 kg)  BMI 32.10 kg/m2 Gen: NAD, alert, cooperative with exam HEENT: NCAT CV: RRR, good S1/S2, no murmur Resp: CTABL, no wheezes, non-labored Neuro: Alert and conversational, confused at times  Skin Right lower leg with large with on the posterior surface, approximately 5 cm x 15 cm with macerated tissue active draining clear fluid. Anterior right leg with smaller macerated lesion measuring approximately 2 cm in diameter and roughly circular 1 additional nearby that's roughly 1 cm in diameter.   Left lower extremity with smaller similar lesion measuring 2 cm in diameter with clear fluid drainage.  Tenderness of the legs bilaterally.  Assessment and plan:  # Venous stasis ulcers Discussed ideal treatment of compression, leg elevation and frequent dressing changes. However patient has basically not tolerating any treatment at all. Will order home health wound care, continue dressings twice daily Return to clinic or call with any fevers, chills, change in mentation. Low threshold for covering with Bactrim. No signs of active infection at this time. Return  to clinic in about 6-8 weeks for diabetic follow-up.   At this point I  think we can consider this a complication of her dementia.    Laroy Apple, MD [facility] 11/06/2015, 4:31 PM

## 2015-11-06 NOTE — Patient Instructions (Signed)
Lets follow up early September

## 2015-11-10 ENCOUNTER — Telehealth: Payer: Self-pay | Admitting: Family Medicine

## 2015-11-11 ENCOUNTER — Other Ambulatory Visit: Payer: Self-pay | Admitting: Family Medicine

## 2015-11-12 NOTE — Telephone Encounter (Signed)
Referral was made on 11/10/15 with [facility], called to confirm today. Called daughter, very upset with me because she hasnt heard from me. I did call on Tues and Wed, but Voice mail was full.

## 2015-11-30 ENCOUNTER — Encounter: Payer: Self-pay | Admitting: Internal Medicine

## 2015-11-30 ENCOUNTER — Non-Acute Institutional Stay (SKILLED_NURSING_FACILITY): Payer: Medicare Other | Admitting: Internal Medicine

## 2015-11-30 ENCOUNTER — Inpatient Hospital Stay
Admission: RE | Admit: 2015-11-30 | Discharge: 2016-01-13 | Disposition: A | Discharge status: Nursing Home (Includes ICF) | Payer: Medicare Other | Source: Ambulatory Visit | Attending: Internal Medicine | Admitting: Internal Medicine

## 2015-11-30 DIAGNOSIS — I482 Chronic atrial fibrillation, unspecified: Secondary | ICD-10-CM

## 2015-11-30 DIAGNOSIS — F32A Depression, unspecified: Secondary | ICD-10-CM

## 2015-11-30 DIAGNOSIS — L03116 Cellulitis of left lower limb: Secondary | ICD-10-CM | POA: Diagnosis not present

## 2015-11-30 DIAGNOSIS — I10 Essential (primary) hypertension: Secondary | ICD-10-CM | POA: Diagnosis not present

## 2015-11-30 DIAGNOSIS — I872 Venous insufficiency (chronic) (peripheral): Secondary | ICD-10-CM

## 2015-11-30 DIAGNOSIS — L97919 Non-pressure chronic ulcer of unspecified part of right lower leg with unspecified severity: Principal | ICD-10-CM

## 2015-11-30 DIAGNOSIS — I83019 Varicose veins of right lower extremity with ulcer of unspecified site: Secondary | ICD-10-CM

## 2015-11-30 DIAGNOSIS — F329 Major depressive disorder, single episode, unspecified: Secondary | ICD-10-CM | POA: Insufficient documentation

## 2015-11-30 DIAGNOSIS — L97929 Non-pressure chronic ulcer of unspecified part of left lower leg with unspecified severity: Principal | ICD-10-CM

## 2015-11-30 DIAGNOSIS — I83029 Varicose veins of left lower extremity with ulcer of unspecified site: Principal | ICD-10-CM

## 2015-11-30 NOTE — Progress Notes (Signed)
Provider: Jonetta Speak :  SNF (31)  PCP: Kenn File, MD Patient Care Team: Timmothy Euler, MD as PCP - General (Family Medicine) Yehuda Savannah, MD (Cardiology) Lendon Colonel, NP as Nurse Practitioner (Nurse Practitioner)  Extended Emergency Contact Information Primary Emergency Contact: [facility] Address: [address]          [place], [place] [address] [name] of [place] Phone: [phone] Mobile Phone: [phone] Relation: Daughter  Code Status: Full Code Goals of Care: Advanced Directive information Advanced Directives 11/30/2015  Does patient have an advance directive? Yes  Type of Advance Directive (No Data)  Does patient want to make changes to advanced directive? No - Patient declined  Copy of advanced directive(s) in chart? Yes      Chief Complaint  Patient presents with  . New Admit To SNF    both leg weeping has alcuers, DM, Aniexty, Dementia    HPI: Patient is a 75 y.o. female seen today for admission to SNF for Wound care. Patient has Non Healing  Venous stasis ulcer in both lower legs. Most of the history was obtained from her daughter. According to the daughter patient has had non healing ulcer with pain in both legs. No history of fever or chills. Does have swelling in both legs. No recent worsening. Patient was seen by wound care a month ago and was advised to have wrap for both legs but due to dementia patient does not keep her dressings on.She also does not keep them elevated. Most of the discharge from the wound is clear. Daughter also don't seem to think that her redness is any worse. Patient does have history of Diabetes. BS running in good range. No episodes of hypoglycemia. Last HBA1C was 7.2 Also has history of Dementia with recent worsening. Can feed herself and goes to bathroom but dependent on the daughter for other ADL  Past Medical History:  Diagnosis Date  . Allergy   . Atrial fibrillation ([place]) 08/2011   First  diagnosed in 08/2011; duration of arrhythmia is uncertain  . Bilateral lower extremity edema   . Chronic diarrhea    diverticulosis  . COPD (chronic obstructive pulmonary disease) ([place])   . Dementia   . Diabetes mellitus, type 2 (HCC)    Diabetic neuropathy  . Dyspnea on exertion    pedal edema  . Gout   . Headache(784.0)    twice weekly  . Hyperlipidemia   . Hypertension   . Osteopenia    DEXA scan 01/2010  . Palpitations   . Seasonal allergies   . Stress incontinence   . Vertigo    Past Surgical History:  Procedure Laterality Date  . APPENDECTOMY    . BREAST BIOPSY  2002   Right  . CESAREAN SECTION     x 2  . CHOLECYSTECTOMY    . COLONOSCOPY  2007   Negative screening study  . HAMMER TOE SURGERY     Bilateral hammer toe amputation  . INCISIONAL HERNIA REPAIR    . KNEE ARTHROSCOPY W/ MENISCAL REPAIR  2007   Bilateral  . UMBILICAL HERNIA REPAIR      reports that she has never smoked. She has never used smokeless tobacco. She reports that she does not drink alcohol or use drugs. Social History   Social History  . Marital status: Widowed    Spouse name: N/A  . Number of children: 2  . Years of education: N/A   Occupational History  . Engineer, materials  Social History Main Topics  . Smoking status: Never Smoker  . Smokeless tobacco: Never Used  . Alcohol use No  . Drug use: No  . Sexual activity: Not on file   Other Topics Concern  . Not on file   Social History Narrative  . No narrative on file    Functional Status Survey:    Family History  Problem Relation Age of Onset  . Adopted: Yes    Health Maintenance  Topic Date Due  . FOOT EXAM  03/18/1951  . OPHTHALMOLOGY EXAM  03/18/1951  . TETANUS/TDAP  03/19/1959  . COLONOSCOPY  03/18/1990  . ZOSTAVAX  03/18/2000  . DEXA SCAN  03/18/2005  . PNA vac Low Risk Adult (1 of 2 - PCV13) 03/18/2005  . INFLUENZA VACCINE  12/01/2015  . URINE MICROALBUMIN  02/24/2016  . HEMOGLOBIN A1C  03/31/2016     Allergies  Allergen Reactions  . Codeine Anaphylaxis and Hives  . Morphine And Related Anaphylaxis and Hives  . Penicillins Anaphylaxis  . Ace Inhibitors Cough      Medication List       Accurate as of 11/30/15 11:51 AM. Always use your most recent med list.          cetirizine 10 MG tablet Commonly known as:  ZYRTEC Take 1 tablet (10 mg total) by mouth daily.   cholecalciferol 1000 units tablet Commonly known as:  VITAMIN D Take 1,000 Units by mouth daily.   citalopram 20 MG tablet Commonly known as:  CELEXA Take 1 tablet (20 mg total) by mouth daily.   digoxin 0.125 MG tablet Commonly known as:  LANOXIN Take 1 tablet by mouth once a day (HOLD FOR AP UNDER 60)   EASY TOUCH PEN NEEDLES 31G X 8 MM Misc Generic drug:  Insulin Pen Needle Use twice a day   furosemide 40 MG tablet Commonly known as:  LASIX TAKE 1 TABLET ONCE DAILY.   HUMULIN 70/30 KWIKPEN (70-30) 100 UNIT/ML PEN Generic drug:  Insulin Isophane & Regular Human INJECT 34 UNITS INTO SKIN TWICE DAILY WITH A MEAL.   metFORMIN 500 MG tablet Commonly known as:  GLUCOPHAGE Take 500 mg by mouth daily.   metoprolol 100 MG tablet Commonly known as:  LOPRESSOR Take 1 tablet (100 mg total) by mouth 2 (two) times daily.   multivitamin with minerals Tabs tablet Take 1 tablet by mouth every morning.   POTASSIUM CHLORIDE ER PO Potassium chloride extended release capsule, give 10 meq by mouth once day   pravastatin 40 MG tablet Commonly known as:  PRAVACHOL Take 1 tablet (40 mg total) by mouth every evening.   traZODone 50 MG tablet Commonly known as:  DESYREL Take 50 mg by mouth at bedtime. As needed for sleep.   XARELTO 20 MG Tabs tablet Generic drug:  rivaroxaban TAKE (1) TABLET BY MOUTH ONCE DAILY.       Review of Systems  Constitutional: Positive for fatigue. Negative for chills and fever.  HENT: Negative for hearing loss and sore throat.   Respiratory: Positive for shortness of breath.  Negative for apnea, cough and chest tightness.   Cardiovascular: Positive for leg swelling. Negative for chest pain.  Gastrointestinal: Negative for abdominal pain, blood in stool and constipation.  Psychiatric/Behavioral: Positive for confusion.    Vitals:   11/30/15 1149  BP: 96/68  Pulse: 74  Resp: 20  Temp: 98.2 F (36.8 C)  TempSrc: Oral   There is no height or weight on file to calculate  BMI. Physical Exam  Constitutional: She appears well-developed and well-nourished.  HENT:  Head: Normocephalic.  Cardiovascular: Normal heart sounds.   Pulmonary/Chest: Breath sounds normal.  Abdominal: Bowel sounds are normal.  Neurological: She is alert.  B/L  Lower extremity edema with Venous stasis ulcers in both lower legs. Left leg has one ulcer with redness around it  And moderate Edema. Right leg has 2 non healing ulcers with redness around and edema. Moderate tenderness in both legs.  Labs reviewed: Basic Metabolic Panel:  Recent Labs  12/31/14 0207 01/14/15 0655  02/07/15 0525 03/24/15 1649 06/30/15 1712  NA  --  141  < > 141 140 139  K  --  3.9  < > 4.4 4.7 4.6  CL  --  105  < > 107 100 98  CO2  --  29  < > 28 25 23   GLUCOSE  --  78  < > 114* 147* 159*  BUN  --  17  < > 19 20 15   CREATININE  --  0.87  < > 0.92 0.99 0.95  CALCIUM  --  9.0  < > 9.1 10.5* 9.5  MG 1.5* 1.9  --   --   --   --   < > = values in this interval not displayed. Liver Function Tests:  Recent Labs  01/22/15 0710 03/24/15 1649 06/30/15 1712  AST 18 16 15   ALT 15 21 13   ALKPHOS 60 66 58  BILITOT 0.6 0.3 0.2  PROT 6.8 7.5 7.3  ALBUMIN 3.2* 3.7 3.5   No results for input(s): LIPASE, AMYLASE in the last 8760 hours. No results for input(s): AMMONIA in the last 8760 hours. CBC:  Recent Labs  01/14/15 0655 01/22/15 0710 02/07/15 0525  WBC 5.0 6.2 6.3  NEUTROABS 2.2 3.0 3.9  HGB 11.9* 13.0 13.6  HCT 36.0 40.8 41.5  MCV 92.1 92.5 93.9  PLT 250 222 201   Cardiac  Enzymes:  Recent Labs  12/31/14 0656 12/31/14 1213 12/31/14 1842  TROPONINI 0.05* 0.04* 0.03   BNP: Invalid input(s): POCBNP Lab Results  Component Value Date   HGBA1C 7.3 06/30/2015   Lab Results  Component Value Date   TSH 2.936 09/09/2011   No results found for: VITAMINB12 No results found for: FOLATE No results found for: IRON, TIBC, FERRITIN  Imaging and Procedures obtained prior to SNF admission: No results found.  Assessment and Plan, Patient was admitted to SNF for wound care.  Cellulitis Of Left Leg Patient has non healing ulcer with Cellulitis. Will start her on Doxycyline 100 Mg BID for 7 Days. Probiotics  Venous Stasis ulcer of Both extremities. Started Santyl Dressings Continue lasix for edema. Keep Both extremities elevated. Recheck BMP. Follow up with Wound Clinic in week. Use PRN Tylenol for Pain. Especially before dressing changes.  Chronic Atrial Fibrillation. Patient is on Lopressor for rate control. continue Xarelto and Digoxin. Check Dig Level.  Hypertension Patient BP is low. Will continue Lopressor for now. Recheck BP and consider decreasing the dose. Depression Continue Celexa. Dementia Patient does have significant cognitive dysfunction. Discussed with speech Therapist. Needs evaluation before discharge.       Pa      Family/ staff Communication:   Labs/tests ordered:  Will Check BMP and CBC with Diff. Digoxin level

## 2015-12-01 ENCOUNTER — Encounter (HOSPITAL_COMMUNITY)
Admission: RE | Admit: 2015-12-01 | Discharge: 2015-12-01 | Disposition: A | Discharge status: Home / Self Care | Payer: Medicare Other | Source: Skilled Nursing Facility | Attending: Internal Medicine | Admitting: Internal Medicine

## 2015-12-01 DIAGNOSIS — F0391 Unspecified dementia with behavioral disturbance: Secondary | ICD-10-CM | POA: Insufficient documentation

## 2015-12-01 DIAGNOSIS — J301 Allergic rhinitis due to pollen: Secondary | ICD-10-CM | POA: Insufficient documentation

## 2015-12-01 DIAGNOSIS — E1140 Type 2 diabetes mellitus with diabetic neuropathy, unspecified: Secondary | ICD-10-CM | POA: Insufficient documentation

## 2015-12-01 DIAGNOSIS — I5042 Chronic combined systolic (congestive) and diastolic (congestive) heart failure: Secondary | ICD-10-CM | POA: Insufficient documentation

## 2015-12-01 DIAGNOSIS — I482 Chronic atrial fibrillation: Secondary | ICD-10-CM | POA: Insufficient documentation

## 2015-12-01 DIAGNOSIS — I10 Essential (primary) hypertension: Secondary | ICD-10-CM | POA: Insufficient documentation

## 2015-12-01 LAB — CBC WITH DIFFERENTIAL/PLATELET
Basophils Absolute: 0 10*3/uL (ref 0.0–0.1)
Basophils Relative: 0 %
EOS ABS: 0.2 10*3/uL (ref 0.0–0.7)
EOS PCT: 2 %
HEMATOCRIT: 37.4 % (ref 36.0–46.0)
HEMOGLOBIN: 12 g/dL (ref 12.0–15.0)
LYMPHS PCT: 22 %
Lymphs Abs: 1.6 10*3/uL (ref 0.7–4.0)
MCH: 29.6 pg (ref 26.0–34.0)
MCHC: 32.1 g/dL (ref 30.0–36.0)
MCV: 92.3 fL (ref 78.0–100.0)
MONO ABS: 0.8 10*3/uL (ref 0.1–1.0)
Monocytes Relative: 11 %
Neutro Abs: 4.9 10*3/uL (ref 1.7–7.7)
Neutrophils Relative %: 65 %
Platelets: 300 10*3/uL (ref 150–400)
RBC: 4.05 MIL/uL (ref 3.87–5.11)
RDW: 14.3 % (ref 11.5–15.5)
WBC: 7.5 10*3/uL (ref 4.0–10.5)

## 2015-12-01 LAB — DIGOXIN LEVEL: Digoxin Level: 0.7 ng/mL — ABNORMAL LOW (ref 0.8–2.0)

## 2015-12-01 LAB — BASIC METABOLIC PANEL
Anion gap: 4 — ABNORMAL LOW (ref 5–15)
BUN: 33 mg/dL — AB (ref 6–20)
CHLORIDE: 108 mmol/L (ref 101–111)
CO2: 23 mmol/L (ref 22–32)
Calcium: 8.7 mg/dL — ABNORMAL LOW (ref 8.9–10.3)
Creatinine, Ser: 1.02 mg/dL — ABNORMAL HIGH (ref 0.44–1.00)
GFR calc Af Amer: >60 mL/min (ref >60)
GFR calc non Af Amer: 52 mL/min — ABNORMAL LOW (ref >60)
Glucose, Bld: 196 mg/dL — ABNORMAL HIGH (ref 65–99)
POTASSIUM: 4.4 mmol/L (ref 3.5–5.1)
SODIUM: 135 mmol/L (ref 135–145)

## 2015-12-09 ENCOUNTER — Encounter: Payer: Self-pay | Admitting: Internal Medicine

## 2015-12-09 ENCOUNTER — Non-Acute Institutional Stay (SKILLED_NURSING_FACILITY): Payer: Medicare Other | Admitting: Internal Medicine

## 2015-12-09 DIAGNOSIS — I482 Chronic atrial fibrillation, unspecified: Secondary | ICD-10-CM

## 2015-12-09 DIAGNOSIS — E118 Type 2 diabetes mellitus with unspecified complications: Secondary | ICD-10-CM | POA: Diagnosis not present

## 2015-12-09 DIAGNOSIS — I10 Essential (primary) hypertension: Secondary | ICD-10-CM | POA: Diagnosis not present

## 2015-12-09 NOTE — Progress Notes (Signed)
Location:   [facility] Room Number: 113/W Place of Service:  SNF [phone]) Provider:  Jodelle Green, MD  Patient Care Team: Timmothy Euler, MD as PCP - General (Family Medicine) Yehuda Savannah, MD (Cardiology) Lendon Colonel, NP as Nurse Practitioner (Nurse Practitioner)  Extended Emergency Contact Information Primary Emergency Contact: [facility] Address: [address]          [place], [place] [address] [name] of [place] Phone: [phone] Mobile Phone: [phone] Relation: Daughter  Code Status:  DNR Goals of care: Advanced Directive information Advanced Directives 12/09/2015  Does patient have an advance directive? Yes  Type of Advance Directive Out of facility DNR (pink MOST or yellow form)  Does patient want to make changes to advanced directive? No - Patient declined  Copy of advanced directive(s) in chart? Yes     Chief complaint-acute visit follow-up low blood sugar this morning and 47  HPI:  Pt is a 75 y.o. female seen today for an acute visit for a blood sugar of 47 this morning.  She does have a history of type 2 diabetes is on Humulin  70/30 35 units twice a day. As well as Glucophage 500 mg every morning  Per review of blood sugars and appears at times she does have lower readings in the morning including a 77-and a 47 this morning.  Later day blood sugars appear to be more in the mid 100s with rare readings above 200.  I do note at noon at times she does have a lower blood sugar including a 79-again I notice 6 PM she did have a 68   Apparently she was relatively asymptomatic this morning-this is somewhat difficult to tell at times secondary to her history of dementia.  Today she is ambulating about the hallway in her wheelchair which is her baseline mental status appears to be stable she does have baseline dementia with confusion     Past Medical History:  Diagnosis Date  . Allergy   . Atrial  fibrillation ([place]) 08/2011   First diagnosed in 08/2011; duration of arrhythmia is uncertain  . Bilateral lower extremity edema   . Chronic diarrhea    diverticulosis  . COPD (chronic obstructive pulmonary disease) ([place])   . Dementia   . Diabetes mellitus, type 2 (HCC)    Diabetic neuropathy  . Dyspnea on exertion    pedal edema  . Gout   . Headache(784.0)    twice weekly  . Hyperlipidemia   . Hypertension   . Osteopenia    DEXA scan 01/2010  . Palpitations   . Seasonal allergies   . Stress incontinence   . Vertigo    Past Surgical History:  Procedure Laterality Date  . APPENDECTOMY    . BREAST BIOPSY  2002   Right  . CESAREAN SECTION     x 2  . CHOLECYSTECTOMY    . COLONOSCOPY  2007   Negative screening study  . HAMMER TOE SURGERY     Bilateral hammer toe amputation  . INCISIONAL HERNIA REPAIR    . KNEE ARTHROSCOPY W/ MENISCAL REPAIR  2007   Bilateral  . UMBILICAL HERNIA REPAIR      Allergies  Allergen Reactions  . Codeine Anaphylaxis and Hives  . Morphine And Related Anaphylaxis and Hives  . Penicillins Anaphylaxis  . Ace Inhibitors Cough    Current Outpatient Prescriptions on File Prior to Visit  Medication Sig Dispense Refill  . cetirizine (ZYRTEC)  10 MG tablet Take 1 tablet (10 mg total) by mouth daily. 90 tablet 3  . cholecalciferol (VITAMIN D) 1000 UNITS tablet Take 1,000 Units by mouth daily.    . citalopram (CELEXA) 20 MG tablet Take 1 tablet (20 mg total) by mouth daily. 90 tablet 3  . digoxin (LANOXIN) 0.125 MG tablet Take 1 tablet by mouth once a day (HOLD FOR AP UNDER 60)    . HUMULIN 70/30 KWIKPEN (70-30) 100 UNIT/ML PEN INJECT 34 UNITS INTO SKIN TWICE DAILY WITH A MEAL. 15 mL 1  . Insulin Pen Needle (EASY TOUCH PEN NEEDLES) 31G X 8 MM MISC Use twice a day    . metFORMIN (GLUCOPHAGE) 500 MG tablet Take 500 mg by mouth daily.    . metoprolol (LOPRESSOR) 100 MG tablet Take 1 tablet (100 mg total) by mouth 2 (two) times daily. 180 tablet 3  .  Multiple Vitamin (MULTIVITAMIN WITH MINERALS) TABS Take 1 tablet by mouth every morning.    Marland Kitchen POTASSIUM CHLORIDE ER PO Potassium chloride extended release capsule, give 10 meq by mouth once day    . pravastatin (PRAVACHOL) 40 MG tablet Take 1 tablet (40 mg total) by mouth every evening. 90 tablet 3  . traZODone (DESYREL) 50 MG tablet Take 50 mg by mouth at bedtime. As needed for sleep.    Alveda Reasons 20 MG TABS tablet TAKE (1) TABLET BY MOUTH ONCE DAILY. 30 tablet 2  . [DISCONTINUED] K-DUR 20 MEQ tablet TAKE 2 TABLETS BY MOUTH ONCE DAILY. 60 tablet 5   No current facility-administered medications on file prior to visit.      Review of Systems   Essentially unattainable secondary to dementia-nursing does not report any acute changes from baseline no complaints of chest pain shortness of breath-apparently she was relatively asymptomatic this morning with the low blood sugar   There is no immunization history on file for this patient. Pertinent  Health Maintenance Due  Topic Date Due  . INFLUENZA VACCINE  04/01/2016 (Originally 12/01/2015)  . FOOT EXAM  11/29/2016 (Originally 03/18/1951)  . OPHTHALMOLOGY EXAM  11/29/2016 (Originally 03/18/1951)  . DEXA SCAN  11/29/2016 (Originally 03/18/2005)  . PNA vac Low Risk Adult (1 of 2 - PCV13) 11/29/2016 (Originally 03/18/2005)  . COLONOSCOPY  11/29/2025 (Originally 03/18/1990)  . URINE MICROALBUMIN  02/24/2016  . HEMOGLOBIN A1C  03/31/2016   Fall Risk  11/06/2015 09/29/2015 08/31/2015 06/30/2015 02/24/2015  Falls in the past year? Yes No No No Yes  Number falls in past yr: 2 or more - - - 2 or more  Injury with Fall? No - - - No   Functional Status Survey:    Vitals:   12/09/15 1115  BP: 121/74  Pulse: 86  Resp: 20  Temp: 98 F (36.7 C)  TempSrc: Oral   There is no height or weight on file to calculate BMI. Physical Exam   This is a pleasant elderly female in no distress sitting in her wheelchair.  Her skin is warm and dry.  Eyes  pupils appear reactive light sclera and conjunctiva clear visual acuity appears grossly intact.  Oropharynx clear mucous membranes moist.  Chest is clear to auscultation there is no labored breathing.  Heart is regular with occasional irregular beats in the 80s-legs are currently wrapped bilaterally.  Muscle skeletal is able to move upper extremities at baseline with no deformities other than arthritic-lower legs are wrapped she is able to move her legs although has lower extremity weakness.  Neurologic appears to  be grossly intact her speech is clear no lateralizing findings.  Psych she is alert oriented to self only is pleasant and cooperative with exam prompted.        Labs reviewed:  Recent Labs  12/31/14 0207 01/14/15 0655  03/24/15 1649 06/30/15 1712 12/01/15 0734  NA  --  141  < > 140 139 135  K  --  3.9  < > 4.7 4.6 4.4  CL  --  105  < > 100 98 108  CO2  --  29  < > 25 23 23   GLUCOSE  --  78  < > 147* 159* 196*  BUN  --  17  < > 20 15 33*  CREATININE  --  0.87  < > 0.99 0.95 1.02*  CALCIUM  --  9.0  < > 10.5* 9.5 8.7*  MG 1.5* 1.9  --   --   --   --   < > = values in this interval not displayed.  Recent Labs  01/22/15 0710 03/24/15 1649 06/30/15 1712  AST 18 16 15   ALT 15 21 13   ALKPHOS 60 66 58  BILITOT 0.6 0.3 0.2  PROT 6.8 7.5 7.3  ALBUMIN 3.2* 3.7 3.5    Recent Labs  01/22/15 0710 02/07/15 0525 12/01/15 0734  WBC 6.2 6.3 7.5  NEUTROABS 3.0 3.9 4.9  HGB 13.0 13.6 12.0  HCT 40.8 41.5 37.4  MCV 92.5 93.9 92.3  PLT 222 201 300   Lab Results  Component Value Date   TSH 2.936 09/09/2011   Lab Results  Component Value Date   HGBA1C 7.3 06/30/2015   Lab Results  Component Value Date   CHOL 153 09/21/2011   HDL 47 09/21/2011   LDLCALC 76 09/21/2011   TRIG 148 09/21/2011   CHOLHDL 3.3 09/21/2011    Significant Diagnostic Results in last 30 days:  No results found.  Assessment/Plan .  #1 history of diabetes type 2-at this point  will decrease her Humulin 70/30 down to 25 units twice a day would like to avoid hypoglycemia this appears relatively well controlled later in the day but will have to be watched-again concern with the blood sugar 47 this morning as well as previous somewhat low readings at times during the day.  Also a bedtime snack will have to be encouraged.  #2 atrial fibrillation this appears to be under decent rate control she is on Lopressor as well as digoxin continues on Xarelto for anticoagulation--rate is in the 80s today  #3 hypertension this appears stable she is on Lopressor 100 mg twice a day-recent blood pressures 115/74-114/76--121/75      t [id number]-of note greater than 25 minutes spent assessing patient-reviewing her labs-her chart-discussing status with nursing staff-and coordinating a plan of care         Oralia Manis, [place]

## 2015-12-15 ENCOUNTER — Encounter (HOSPITAL_COMMUNITY)
Admission: RE | Admit: 2015-12-15 | Discharge: 2015-12-15 | Disposition: A | Discharge status: Home / Self Care | Payer: Medicare Other | Source: Skilled Nursing Facility | Attending: Internal Medicine | Admitting: Internal Medicine

## 2015-12-15 LAB — BASIC METABOLIC PANEL
Anion gap: 4 — ABNORMAL LOW (ref 5–15)
BUN: 15 mg/dL (ref 6–20)
CHLORIDE: 105 mmol/L (ref 101–111)
CO2: 28 mmol/L (ref 22–32)
CREATININE: 0.84 mg/dL (ref 0.44–1.00)
Calcium: 8 mg/dL — ABNORMAL LOW (ref 8.9–10.3)
GFR calc Af Amer: >60 mL/min (ref >60)
GFR calc non Af Amer: >60 mL/min (ref >60)
Glucose, Bld: 153 mg/dL — ABNORMAL HIGH (ref 65–99)
Potassium: 3.8 mmol/L (ref 3.5–5.1)
SODIUM: 137 mmol/L (ref 135–145)

## 2015-12-22 ENCOUNTER — Other Ambulatory Visit: Payer: Self-pay | Admitting: *Deleted

## 2015-12-22 MED ORDER — LORAZEPAM 0.5 MG PO TABS: ORAL_TABLET | ORAL | 0 refills | Status: DC

## 2015-12-22 NOTE — Telephone Encounter (Signed)
[facility]  

## 2015-12-23 DIAGNOSIS — G4700 Insomnia, unspecified: Secondary | ICD-10-CM | POA: Diagnosis not present

## 2015-12-23 DIAGNOSIS — F329 Major depressive disorder, single episode, unspecified: Secondary | ICD-10-CM | POA: Diagnosis not present

## 2015-12-23 DIAGNOSIS — F419 Anxiety disorder, unspecified: Secondary | ICD-10-CM | POA: Diagnosis not present

## 2015-12-23 DIAGNOSIS — F0391 Unspecified dementia with behavioral disturbance: Secondary | ICD-10-CM | POA: Diagnosis not present

## 2015-12-31 ENCOUNTER — Ambulatory Visit: Payer: Medicare Other | Admitting: Family Medicine

## 2016-01-07 ENCOUNTER — Non-Acute Institutional Stay (SKILLED_NURSING_FACILITY): Payer: Medicare Other | Admitting: Internal Medicine

## 2016-01-07 DIAGNOSIS — E118 Type 2 diabetes mellitus with unspecified complications: Secondary | ICD-10-CM | POA: Diagnosis not present

## 2016-01-07 NOTE — Progress Notes (Signed)
This is an acute visit.  Level care skilled.  Facility to nursing.  Chief complaint-follow-up diabetes type 2 with elevated blood sugars.  History of present illness.  Patient is a pleasant 75 year old female with a history of diabetes type 2.  She is on Humulin 70/30 25 units twice a day as well as Glucophage 500 mg every morning.  Approximately a week ago she was found to have a blood sugar 47 at one point in the morning this didn't come up with supplementation.  Her blood sugars were reviewed and appears she did have lower readings.  Including some the 70s.  Later in the day sugars were in the mid 100s occasionally above 200. Although there was a 79 as well as a 68.  Her blood sugars recently have been consistently somewhat elevated above 200 through [facility].  Blood sugars this week in the morning were largely in the 200s it was 153 yesterday.   If consistently recently in the 200s.  At 4 PM in the usually higher 200s I see one of 322 also 1 of 154.  And at bedtime her sugars are largely again in the 200s.  Clinically she appears to be stable she is sitting in her wheelchair comfortably today is bright alert confused which is her baseline Past Medical History:  Diagnosis Date  . Allergy   . Atrial fibrillation ([place]) 08/2011   First diagnosed in 08/2011; duration of arrhythmia is uncertain  . Bilateral lower extremity edema   . Chronic diarrhea    diverticulosis  . COPD (chronic obstructive pulmonary disease) ([place])   . Dementia   . Diabetes mellitus, type 2 (HCC)    Diabetic neuropathy  . Dyspnea on exertion    pedal edema  . Gout   . Headache(784.0)    twice weekly  . Hyperlipidemia   . Hypertension   . Osteopenia    DEXA scan 01/2010  . Palpitations   . Seasonal allergies   . Stress incontinence   . Vertigo         Past Surgical History:  Procedure Laterality Date  . APPENDECTOMY    . BREAST BIOPSY  2002     Right  . CESAREAN SECTION     x 2  . CHOLECYSTECTOMY    . COLONOSCOPY  2007   Negative screening study  . HAMMER TOE SURGERY     Bilateral hammer toe amputation  . INCISIONAL HERNIA REPAIR    . KNEE ARTHROSCOPY W/ MENISCAL REPAIR  2007   Bilateral  . UMBILICAL HERNIA REPAIR          Allergies  Allergen Reactions  . Codeine Anaphylaxis and Hives  . Morphine And Related Anaphylaxis and Hives  . Penicillins Anaphylaxis  . Ace Inhibitors Cough          Current Outpatient Prescriptions on File Prior to Visit  Medication Sig Dispense Refill  . cetirizine (ZYRTEC) 10 MG tablet Take 1 tablet (10 mg total) by mouth daily. 90 tablet 3  . cholecalciferol (VITAMIN D) 1000 UNITS tablet Take 1,000 Units by mouth daily.    . citalopram (CELEXA) 20 MG tablet Take 1 tablet (20 mg total) by mouth daily. 90 tablet 3  . digoxin (LANOXIN) 0.125 MG tablet Take 1 tablet by mouth once a day (HOLD FOR AP UNDER 60)    . HUMULIN 70/30 KWIKPEN (70-30) 100 UNIT/ML PEN INJECT 28 UNITS INTO SKIN TWICE DAILY WITH A MEAL. 15 mL 1  . Insulin  Pen Needle (EASY TOUCH PEN NEEDLES) 31G X 8 MM MISC Use twice a day    . metFORMIN (GLUCOPHAGE) 500 MG tablet Take 500 mg by mouth daily.    . metoprolol (LOPRESSOR) 100 MG tablet Take 1 tablet (100 mg total) by mouth 2 (two) times daily. 180 tablet 3  . Multiple Vitamin (MULTIVITAMIN WITH MINERALS) TABS Take 1 tablet by mouth every morning.    Marland Kitchen POTASSIUM CHLORIDE ER PO Potassium chloride extended release capsule, give 10 meq by mouth once day    . pravastatin (PRAVACHOL) 40 MG tablet Take 1 tablet (40 mg total) by mouth every evening. 90 tablet 3  . traZODone (DESYREL) 50 MG tablet Take 50 mg by mouth at bedtime. As needed for sleep.    Alveda Reasons 20 MG TABS tablet TAKE (1) TABLET BY MOUTH ONCE DAILY. 30 tablet 2  . [DISCONTINUED] K-DUR 20 MEQ tablet TAKE 2 TABLETS BY MOUTH ONCE DAILY. 60 tablet 5   No current facility-administered  medications on file prior to visit.      Review of Systems   Essentially unattainable secondary to dementia-nursing does not report any acute changes from baseline no complaints of chest pain shortness of breathHer behaviors and agitation appear to be improved   There is no immunization history on file for this patient.     Pertinent  Health Maintenance Due  Topic Date Due  . INFLUENZA VACCINE  04/01/2016 (Originally 12/01/2015)  . FOOT EXAM  11/29/2016 (Originally 03/18/1951)  . OPHTHALMOLOGY EXAM  11/29/2016 (Originally 03/18/1951)  . DEXA SCAN  11/29/2016 (Originally 03/18/2005)  . PNA vac Low Risk Adult (1 of 2 - PCV13) 11/29/2016 (Originally 03/18/2005)  . COLONOSCOPY  11/29/2025 (Originally 03/18/1990)  . URINE MICROALBUMIN  02/24/2016  . HEMOGLOBIN A1C  03/31/2016   Fall Risk  11/06/2015 09/29/2015 08/31/2015 06/30/2015 02/24/2015  Falls in the past year? Yes No No No Yes  Number falls in past yr: 2 or more - - - 2 or more  Injury with Fall? No - - - No   Functional Status Survey:  Temperature 98.0 pulse 72 respirations 20 blood pressure 118/64   Physical Exam   This is a pleasant elderly female in no distress sitting in her wheelchair.  Her skin is warm and dry.  Eyes pupils appear reactive light sclera and conjunctiva clear visual acuity appears grossly intact.  Oropharynx clear mucous membranes moist.  Chest is clear to auscultation there is no labored breathing.  Heart is regular with occasional irregular beats   Abdomen is obese soft nontender with positive bowel sounds  Muscle skeletal is able to move upper extremities at baseline with no deformities other than arthritic-lower legs are wrapped she is able to move her legs although has lower extremity weakness.     Psych she is alert oriented to self only is pleasant and cooperative with exam prompted.        Labs reviewed:  RecentLabs(withinlast365days)   Recent  Labs 12/15/2015.  Sodium 137 potassium 3.8 BUN 15 creatinine 0.84.  12/01/2015.  WBC 7.5 hemoglobin 12.0 platelets 300,000   12/31/14 0207 01/14/15 0655  03/24/15 1649 06/30/15 1712 12/01/15 0734  NA  --  141  < > 140 139 135  K  --  3.9  < > 4.7 4.6 4.4  CL  --  105  < > 100 98 108  CO2  --  29  < > 25 23 23   GLUCOSE  --  78  < > 147*  159* 196*  BUN  --  17  < > 20 15 33*  CREATININE  --  0.87  < > 0.99 0.95 1.02*  CALCIUM  --  9.0  < > 10.5* 9.5 8.7*  MG 1.5* 1.9  --   --   --   --   < > = values in this interval not displayed.    RecentLabs(withinlast365days)   Recent Labs  01/22/15 0710 03/24/15 1649 06/30/15 1712  AST 18 16 15   ALT 15 21 13   ALKPHOS 60 66 58  BILITOT 0.6 0.3 0.2  PROT 6.8 7.5 7.3  ALBUMIN 3.2* 3.7 3.5      RecentLabs(withinlast365days)   Recent Labs  01/22/15 0710 02/07/15 0525 12/01/15 0734  WBC 6.2 6.3 7.5  NEUTROABS 3.0 3.9 4.9  HGB 13.0 13.6 12.0  HCT 40.8 41.5 37.4  MCV 92.5 93.9 92.3  PLT 222 201 300     RecentLabs       Lab Results  Component Value Date   TSH 2.936 09/09/2011     RecentLabs       Lab Results  Component Value Date   HGBA1C 7.3 06/30/2015     RecentLabs       Lab Results  Component Value Date   CHOL 153 09/21/2011   HDL 47 09/21/2011   LDLCALC 76 09/21/2011   TRIG 148 09/21/2011   CHOLHDL 3.3 09/21/2011      Significant Diagnostic Results in last 30 days:  ImagingResults  No results found.    Assessment/Plan .  #1 history of diabetes type 2- She has had some low blood sugars previously-however now. Be somewhat consistently elevated in the 200s-we have increased her Humulin 70/30 up to 28 units at this point will monitor we may have to titrate this further but would like to avoid hypoglycemia if at all possible   [id number]

## 2016-01-11 DIAGNOSIS — F0391 Unspecified dementia with behavioral disturbance: Secondary | ICD-10-CM | POA: Diagnosis not present

## 2016-01-11 DIAGNOSIS — G4700 Insomnia, unspecified: Secondary | ICD-10-CM | POA: Diagnosis not present

## 2016-01-11 DIAGNOSIS — F329 Major depressive disorder, single episode, unspecified: Secondary | ICD-10-CM | POA: Diagnosis not present

## 2016-01-11 DIAGNOSIS — F419 Anxiety disorder, unspecified: Secondary | ICD-10-CM | POA: Diagnosis not present

## 2016-01-13 ENCOUNTER — Emergency Department: Payer: Medicare Other

## 2016-01-13 ENCOUNTER — Emergency Department (HOSPITAL_COMMUNITY): Payer: Medicare Other

## 2016-01-13 ENCOUNTER — Encounter (HOSPITAL_COMMUNITY): Payer: Self-pay | Admitting: Emergency Medicine

## 2016-01-13 ENCOUNTER — Emergency Department (HOSPITAL_COMMUNITY)
Admission: EM | Admit: 2016-01-13 | Discharge: 2016-01-14 | Disposition: A | Discharge status: Home / Self Care | Payer: Medicare Other | Attending: Emergency Medicine | Admitting: Emergency Medicine

## 2016-01-13 DIAGNOSIS — Y999 Unspecified external cause status: Secondary | ICD-10-CM | POA: Diagnosis not present

## 2016-01-13 DIAGNOSIS — W19XXXA Unspecified fall, initial encounter: Secondary | ICD-10-CM

## 2016-01-13 DIAGNOSIS — S0231XA Fracture of orbital floor, right side, initial encounter for closed fracture: Secondary | ICD-10-CM | POA: Diagnosis not present

## 2016-01-13 DIAGNOSIS — Y9289 Other specified places as the place of occurrence of the external cause: Secondary | ICD-10-CM | POA: Insufficient documentation

## 2016-01-13 DIAGNOSIS — R51 Headache: Secondary | ICD-10-CM | POA: Diagnosis not present

## 2016-01-13 DIAGNOSIS — M542 Cervicalgia: Secondary | ICD-10-CM | POA: Insufficient documentation

## 2016-01-13 DIAGNOSIS — Z79899 Other long term (current) drug therapy: Secondary | ICD-10-CM | POA: Diagnosis not present

## 2016-01-13 DIAGNOSIS — Y939 Activity, unspecified: Secondary | ICD-10-CM | POA: Insufficient documentation

## 2016-01-13 DIAGNOSIS — E119 Type 2 diabetes mellitus without complications: Secondary | ICD-10-CM | POA: Diagnosis not present

## 2016-01-13 DIAGNOSIS — I10 Essential (primary) hypertension: Secondary | ICD-10-CM | POA: Insufficient documentation

## 2016-01-13 DIAGNOSIS — Z794 Long term (current) use of insulin: Secondary | ICD-10-CM | POA: Diagnosis not present

## 2016-01-13 DIAGNOSIS — S023XXA Fracture of orbital floor, initial encounter for closed fracture: Secondary | ICD-10-CM

## 2016-01-13 DIAGNOSIS — S098XXA Other specified injuries of head, initial encounter: Secondary | ICD-10-CM | POA: Diagnosis present

## 2016-01-13 DIAGNOSIS — Z7984 Long term (current) use of oral hypoglycemic drugs: Secondary | ICD-10-CM | POA: Diagnosis not present

## 2016-01-13 DIAGNOSIS — J449 Chronic obstructive pulmonary disease, unspecified: Secondary | ICD-10-CM | POA: Diagnosis not present

## 2016-01-13 DIAGNOSIS — W1839XA Other fall on same level, initial encounter: Secondary | ICD-10-CM | POA: Insufficient documentation

## 2016-01-13 DIAGNOSIS — S0083XA Contusion of other part of head, initial encounter: Secondary | ICD-10-CM

## 2016-01-13 DIAGNOSIS — S0181XA Laceration without foreign body of other part of head, initial encounter: Secondary | ICD-10-CM

## 2016-01-13 MED ORDER — LORAZEPAM 2 MG/ML IJ SOLN
1.0000 mg | Freq: Once | INTRAMUSCULAR | Status: AC
  Administered 2016-01-13: 1 mg via INTRAMUSCULAR
  Filled 2016-01-13: qty 1

## 2016-01-13 NOTE — ED Provider Notes (Signed)
[facility] DEPT Provider Note   CSN: [id number] Arrival date & time: 01/13/16  2150     History   Chief Complaint Chief Complaint  Patient presents with  . Fall    HPI ZEA NAIK is a 75 y.o. female.  Pt is a 75 y/o female who presents to the ED following a fall at a local nursing facility.  Pt states she loss her footing and fell. She does not remember much after that. No c/o headache prior to the fall. No dizziness, no reported fever, and palpitations prior to the fall. Pt c/o headache, neck pain, left rib pain and left hip pain.  It is of note pt is on Xarelto for Atrial Fib.   The history is provided by the patient and a relative.  Fall  Associated symptoms include headaches.    Past Medical History:  Diagnosis Date  . Allergy   . Atrial fibrillation ([place]) 08/2011   First diagnosed in 08/2011; duration of arrhythmia is uncertain  . Bilateral lower extremity edema   . Chronic diarrhea    diverticulosis  . COPD (chronic obstructive pulmonary disease) ([place])   . Dementia   . Diabetes mellitus, type 2 (HCC)    Diabetic neuropathy  . Dyspnea on exertion    pedal edema  . Gout   . Headache(784.0)    twice weekly  . Hyperlipidemia   . Hypertension   . Osteopenia    DEXA scan 01/2010  . Palpitations   . Seasonal allergies   . Stress incontinence   . Vertigo     Patient Active Problem List   Diagnosis Date Noted  . Depression 11/30/2015  . Diabetic neuropathy ([place]) 09/29/2015  . Difficulty walking 08/31/2015  . Venous stasis 05/19/2015  . Venous stasis dermatitis 05/19/2015  . Pain of left great toe 03/24/2015  . Dementia with behavioral disturbance 02/24/2015  . Atrial fibrillation with rapid ventricular response ([place])   . Chronic anticoagulation 09/21/2011  . Hypertension   . Hyperlipidemia   . Diabetes mellitus type 2, controlled ([place])     Past Surgical History:  Procedure Laterality Date  . APPENDECTOMY    . BREAST BIOPSY  2002   Right    . CESAREAN SECTION     x 2  . CHOLECYSTECTOMY    . COLONOSCOPY  2007   Negative screening study  . HAMMER TOE SURGERY     Bilateral hammer toe amputation  . INCISIONAL HERNIA REPAIR    . KNEE ARTHROSCOPY W/ MENISCAL REPAIR  2007   Bilateral  . UMBILICAL HERNIA REPAIR      OB History    Gravida Para Term Preterm AB Living   1 1 1          SAB TAB Ectopic Multiple Live Births                   Home Medications    Prior to Admission medications   Medication Sig Start Date End Date Taking? Authorizing Provider  acetaminophen (TYLENOL) 325 MG tablet Take 650 mg by mouth every 6 (six) hours as needed.    Historical Provider, MD  Amino Acids-Protein Hydrolys (FEEDING SUPPLEMENT, PRO-STAT SUGAR FREE 64,) LIQD Take 30 mLs by mouth daily.    Historical Provider, MD  cetirizine (ZYRTEC) 10 MG tablet Take 1 tablet (10 mg total) by mouth daily. 10/06/15   Timmothy Euler, MD  cholecalciferol (VITAMIN D) 1000 UNITS tablet Take 1,000 Units by mouth daily.    Historical  Provider, MD  citalopram (CELEXA) 20 MG tablet Take 1 tablet (20 mg total) by mouth daily. 10/06/15   Timmothy Euler, MD  digoxin (LANOXIN) 0.125 MG tablet Take 1 tablet by mouth once a day (HOLD FOR AP UNDER 60)    Historical Provider, MD  furosemide (LASIX) 20 MG tablet Take 20 mg by mouth.    Historical Provider, MD  HUMULIN 70/30 KWIKPEN (70-30) 100 UNIT/ML PEN INJECT 34 UNITS INTO SKIN TWICE DAILY WITH A MEAL. 04/14/15   Timmothy Euler, MD  Insulin Pen Needle (EASY TOUCH PEN NEEDLES) 31G X 8 MM MISC Use twice a day    Historical Provider, MD  LORazepam (ATIVAN) 0.5 MG tablet Take one tablet by mouth every 8 hours as needed for anxiety 12/22/15   Lauree Chandler, NP  metFORMIN (GLUCOPHAGE) 500 MG tablet Take 500 mg by mouth daily.    Historical Provider, MD  metoprolol (LOPRESSOR) 100 MG tablet Take 1 tablet (100 mg total) by mouth 2 (two) times daily. 10/06/15   Timmothy Euler, MD  Multiple Vitamin (MULTIVITAMIN WITH  MINERALS) TABS Take 1 tablet by mouth every morning.    Historical Provider, MD  POTASSIUM CHLORIDE ER PO Potassium chloride extended release capsule, give 10 meq by mouth once day    Historical Provider, MD  pravastatin (PRAVACHOL) 40 MG tablet Take 1 tablet (40 mg total) by mouth every evening. 10/06/15   Timmothy Euler, MD  Probiotic Product (RISA-BID PROBIOTIC PO) Take 1 tablet by mouth twice a day    Historical Provider, MD  traZODone (DESYREL) 50 MG tablet Take 50 mg by mouth at bedtime. As needed for sleep.    Historical Provider, MD  XARELTO 20 MG TABS tablet TAKE (1) TABLET BY MOUTH ONCE DAILY. 10/02/15   Timmothy Euler, MD    Family History Family History  Problem Relation Age of Onset  . Adopted: Yes    Social History Social History  Substance Use Topics  . Smoking status: Never Smoker  . Smokeless tobacco: Never Used  . Alcohol use No     Allergies   Codeine; Morphine and related; Penicillins; and Ace inhibitors   Review of Systems Review of Systems  HENT:       Hearing difficulty.  Musculoskeletal: Positive for arthralgias and neck pain.  Neurological: Positive for light-headedness and headaches.  All other systems reviewed and are negative.    Physical Exam Updated Vital Signs BP 146/85 (BP Location: Left Arm)   Pulse 101   Temp 98.8 F (37.1 C) (Oral)   Resp 16   Ht 5\' 4"  (1.626 m)   Wt 89.8 kg   SpO2 97%   BMI 33.99 kg/m   Physical Exam  Constitutional: She is oriented to person, place, and time. She appears well-developed and well-nourished.  Non-toxic appearance.  HENT:  Head: Normocephalic.  Right Ear: Tympanic membrane and external ear normal.  Left Ear: Tympanic membrane and external ear normal.  Eyes: EOM and lids are normal. Pupils are equal, round, and reactive to light.  Neck: Normal range of motion. Neck supple. Carotid bruit is not present.  Cervical collar is in place.  Cardiovascular: Regular rhythm, normal heart sounds, intact  distal pulses and normal pulses.  Tachycardia present.   Pulmonary/Chest: Breath sounds normal. No respiratory distress.  Left rib and chest pain to palpation. Symmetrical rise and fall of the chest. Pt speaks in complete sentences.  Abdominal: Soft. Bowel sounds are normal. There is no tenderness.  There is no guarding.  Musculoskeletal: Normal range of motion.  Pain to palpation over the left lateral thigh and hip. No shortening of the lower extremities.  Lymphadenopathy:       Head (right side): No submandibular adenopathy present.       Head (left side): No submandibular adenopathy present.    She has no cervical adenopathy.  Neurological: She is alert and oriented to person, place, and time. She has normal strength. No cranial nerve deficit or sensory deficit.  Skin: Skin is warm and dry.  Psychiatric: She has a normal mood and affect. Her speech is normal.  Nursing note and vitals reviewed.    ED Treatments / Results  Labs (all labs ordered are listed, but only abnormal results are displayed) Labs Reviewed - No data to display  EKG  EKG Interpretation None       Radiology No results found.  Procedures .Marland KitchenLaceration Repair Date/Time: 01/14/2016 1:58 AM Performed by: Lily Kocher Authorized by: Lily Kocher   Consent:    Consent obtained:  Verbal   Consent given by:  Guardian   Risks discussed:  Infection and poor cosmetic result Anesthesia (see MAR for exact dosages):    Anesthesia method:  Topical application and local infiltration   Local anesthetic:  Lidocaine 1% WITH epi Laceration details:    Location:  Face   Face location:  R eyebrow   Length (cm):  2 Repair type:    Repair type:  Simple Pre-procedure details:    Preparation:  Patient was prepped and draped in usual sterile fashion Exploration:    Hemostasis achieved with:  LET   Contaminated: no   Treatment:    Area cleansed with:  Saline   Amount of cleaning:  Standard   Irrigation solution:   Sterile saline   Visualized foreign bodies/material removed: no   Skin repair:    Repair method:  Sutures   Suture size:  5-0   Wound skin closure material used: Vicryl.   Suture technique:  Simple interrupted   Number of sutures:  4 Approximation:    Approximation:  Close   Vermilion border: well-aligned   Post-procedure details:    Dressing:  Sterile dressing   Patient tolerance of procedure:  Tolerated well, no immediate complications   (including critical care time)  Medications Ordered in ED Medications - No data to display   Initial Impression / Assessment and Plan / ED Course  Pt seen with me by Dr Leonette Monarch.   I have reviewed the triage vital signs and the nursing notes.  Pertinent labs & imaging results that were available during my care of the patient were reviewed by me and considered in my medical decision making (see chart for details).  Clinical Course    *I have reviewed nursing notes, vital signs, and all appropriate lab and imaging results for this patient.**  Final Clinical Impressions(s) / ED Diagnoses  Patient's daughter states that the patient has some dementia. Radiology technicians states that the patient will not lay down flat to have the CT scans done. The patient's daughter states that she thinks that it may be related to the atrial fibrillation arrhythmia that the patient has an her having a sensation of choking. She given intramuscular Ativan. We'll retry to obtain scans.   Recheck. Scans completed. Laceration repaired. Family updated.  CT's pending. Pt's care to be continued by Dr Dina Rich   Final diagnoses:  None    New Prescriptions New Prescriptions   No medications  on file     Lily Kocher, [place] 01/14/16 [id number]

## 2016-01-13 NOTE — ED Triage Notes (Signed)
Pt at the [facility], fell forward landing on the R side of her forehead. Lac to forehead that is bandaged. Pt on blood thinners.

## 2016-01-14 ENCOUNTER — Encounter: Payer: Self-pay | Admitting: Internal Medicine

## 2016-01-14 ENCOUNTER — Inpatient Hospital Stay
Admission: RE | Admit: 2016-01-14 | Discharge: 2017-08-08 | Disposition: A | Discharge status: Other Acute Inpatient Hospital | Payer: Medicare Other | Source: Ambulatory Visit | Attending: Internal Medicine | Admitting: Internal Medicine

## 2016-01-14 ENCOUNTER — Emergency Department (HOSPITAL_COMMUNITY): Payer: Medicare Other

## 2016-01-14 ENCOUNTER — Non-Acute Institutional Stay (SKILLED_NURSING_FACILITY): Payer: Medicare Other | Admitting: Internal Medicine

## 2016-01-14 DIAGNOSIS — I878 Other specified disorders of veins: Secondary | ICD-10-CM

## 2016-01-14 DIAGNOSIS — IMO0001 Reserved for inherently not codable concepts without codable children: Secondary | ICD-10-CM

## 2016-01-14 DIAGNOSIS — S0281XS Fracture of other specified skull and facial bones, right side, sequela: Secondary | ICD-10-CM

## 2016-01-14 DIAGNOSIS — Z794 Long term (current) use of insulin: Secondary | ICD-10-CM

## 2016-01-14 DIAGNOSIS — E118 Type 2 diabetes mellitus with unspecified complications: Secondary | ICD-10-CM | POA: Diagnosis not present

## 2016-01-14 DIAGNOSIS — I4891 Unspecified atrial fibrillation: Secondary | ICD-10-CM

## 2016-01-14 LAB — CBG MONITORING, ED: Glucose-Capillary: 399 mg/dL — ABNORMAL HIGH (ref 65–99)

## 2016-01-14 MED ORDER — LIDOCAINE-EPINEPHRINE-TETRACAINE (LET) SOLUTION
3.0000 mL | Freq: Once | NASAL | Status: AC
  Administered 2016-01-14: 3 mL via TOPICAL
  Filled 2016-01-14: qty 3

## 2016-01-14 MED ORDER — LIDOCAINE-EPINEPHRINE (PF) 1 %-1:200000 IJ SOLN
INTRAMUSCULAR | Status: AC
  Filled 2016-01-14: qty 30

## 2016-01-14 MED ORDER — ACETAMINOPHEN 160 MG/5ML PO SOLN
500.0000 mg | Freq: Once | ORAL | Status: AC
  Administered 2016-01-14: 500 mg via ORAL
  Filled 2016-01-14: qty 20.3

## 2016-01-14 NOTE — Discharge Instructions (Signed)
You were seen today after a fall. You sustained a laceration to the face and inferior orbital fracture.  There is no evidence of entrapment. Sutures will dissolve on her own. Follow-up with ENT in 2 weeks for repeat exam.

## 2016-01-14 NOTE — Progress Notes (Signed)
Location:    [facility] Room Number: 113/W Place of Service:  SNF (31) Provider:  Jefm Bryant, MD  Patient Care Team: Celedonio Savage, MD as PCP - General (Family Medicine) Yehuda [facility], MD (Cardiology) Lendon Colonel, NP as Nurse Practitioner (Nurse Practitioner)  Extended Emergency Contact Information Primary Emergency Contact: [facility] Address: [address]          [place], [place] [address] Christine [name] of [place] Phone: [phone] Mobile Phone: [phone] Relation: Daughter  Code Status:  DNR Goals of care: Advanced Directive information Advanced Directives 01/14/2016  Does patient have an advance directive? Yes  Type of Advance Directive Out of facility DNR (pink MOST or yellow form)  Does patient want to make changes to advanced directive? No - Patient declined  Copy of advanced directive(s) in chart? Yes     Chief Complaint  Patient presents with  . Acute Visit    Orbital Fracture    HPI:  Pt is a 75 y.o. female seen today for an acute visit for Right Inferior orbital fracture due to fall. Patient was evaluated in ER and was found to have Right Frontal Hematoma, and minimally displaced inferior orbital fracture. There was no evidence of Ocular entrapment. She was discharged to Nursing facility. Patient had Negative CT scan of head. And no Rib fracture in the Chest Xray. Patient today was only c/o head ache. No nausea vomiting. She did say her right side hurts.  Past Medical History:  Diagnosis Date  . Allergy   . Atrial fibrillation ([place]) 08/2011   First diagnosed in 08/2011; duration of arrhythmia is uncertain  . Bilateral lower extremity edema   . Chronic diarrhea    diverticulosis  . COPD (chronic obstructive pulmonary disease) ([place])   . Dementia   . Diabetes mellitus, type 2 ([facility])    Diabetic neuropathy  . Dyspnea on exertion    pedal edema  . Gout   . Headache(784.0)    twice weekly  .  Hyperlipidemia   . Hypertension   . Osteopenia    DEXA scan 01/2010  . Palpitations   . Seasonal allergies   . Stress incontinence   . Vertigo    Past Surgical History:  Procedure Laterality Date  . APPENDECTOMY    . BREAST BIOPSY  2002   Right  . CESAREAN SECTION     x 2  . CHOLECYSTECTOMY    . COLONOSCOPY  2007   Negative screening study  . HAMMER TOE SURGERY     Bilateral hammer toe amputation  . INCISIONAL HERNIA REPAIR    . KNEE ARTHROSCOPY W/ MENISCAL REPAIR  2007   Bilateral  . UMBILICAL HERNIA REPAIR      Allergies  Allergen Reactions  . Codeine Anaphylaxis and Hives  . Morphine And Related Anaphylaxis and Hives  . Penicillins Anaphylaxis  . Ace Inhibitors Cough    Current Outpatient Prescriptions on File Prior to Visit  Medication Sig Dispense Refill  . acetaminophen (TYLENOL) 325 MG tablet Take 650 mg by mouth every 6 (six) hours as needed.    . Amino Acids-Protein Hydrolys (FEEDING SUPPLEMENT, PRO-STAT SUGAR FREE 64,) LIQD Take 30 mLs by mouth daily.    . cetirizine (ZYRTEC) 10 MG tablet Take 1 tablet (10 mg total) by mouth daily. 90 tablet 3  . cholecalciferol (VITAMIN D) 1000 UNITS tablet Take 1,000 Units by mouth daily.    . citalopram (CELEXA) 20 MG tablet Take 1 tablet (20  mg total) by mouth daily. 90 tablet 3  . digoxin (LANOXIN) 0.125 MG tablet Take 1 tablet by mouth once a day (HOLD FOR AP UNDER 60)    . furosemide (LASIX) 20 MG tablet Take 20 mg by mouth.    . Insulin Pen Needle (EASY TOUCH PEN NEEDLES) 31G X 8 MM MISC Use twice a day    . LORazepam (ATIVAN) 0.5 MG tablet Take one tablet by mouth every 8 hours as needed for anxiety 90 tablet 0  . metFORMIN (GLUCOPHAGE) 500 MG tablet Take 500 mg by mouth daily.    . metoprolol (LOPRESSOR) 100 MG tablet Take 1 tablet (100 mg total) by mouth 2 (two) times daily. 180 tablet 3  . Multiple Vitamin (MULTIVITAMIN WITH MINERALS) TABS Take 1 tablet by mouth every morning.    Marland Kitchen POTASSIUM CHLORIDE ER PO  Potassium chloride extended release capsule, give 10 meq by mouth once day    . pravastatin (PRAVACHOL) 40 MG tablet Take 1 tablet (40 mg total) by mouth every evening. 90 tablet 3  . Probiotic Product (RISA-BID PROBIOTIC PO) Take 1 tablet by mouth twice a day    . traZODone (DESYREL) 50 MG tablet Take 50 mg by mouth at bedtime. As needed for sleep.    Alveda Reasons 20 MG TABS tablet TAKE (1) TABLET BY MOUTH ONCE DAILY. 30 tablet 2  . [DISCONTINUED] K-DUR 20 MEQ tablet TAKE 2 TABLETS BY MOUTH ONCE DAILY. 60 tablet 5   No current facility-administered medications on file prior to visit.      Review of Systems  Constitutional: Negative for activity change, appetite change, chills, diaphoresis, fatigue and fever.  HENT: Positive for facial swelling. Negative for drooling, ear discharge, ear pain, nosebleeds and sinus pressure.   Eyes: Positive for pain and redness. Negative for photophobia.  Respiratory: Negative.   Cardiovascular: Negative.   Gastrointestinal: Negative.      There is no immunization history on file for this patient. Pertinent  Health Maintenance Due  Topic Date Due  . INFLUENZA VACCINE  04/01/2016 (Originally 12/01/2015)  . FOOT EXAM  11/29/2016 (Originally 03/18/1951)  . OPHTHALMOLOGY EXAM  11/29/2016 (Originally 03/18/1951)  . DEXA SCAN  11/29/2016 (Originally 03/18/2005)  . PNA vac Low Risk Adult (1 of 2 - PCV13) 11/29/2016 (Originally 03/18/2005)  . COLONOSCOPY  11/29/2025 (Originally 03/18/1990)  . URINE MICROALBUMIN  02/24/2016  . HEMOGLOBIN A1C  03/31/2016   Fall Risk  11/06/2015 09/29/2015 08/31/2015 06/30/2015 02/24/2015  Falls in the past year? Yes No No No Yes  Number falls in past yr: 2 or more - - - 2 or more  Injury with Fall? No - - - No   Functional Status Survey:    Vitals:   01/14/16 1059  BP: 126/82  Pulse: (!) 101  Resp: 20  Temp: 98 F (36.7 C)  TempSrc: Oral   There is no height or weight on file to calculate BMI. Physical Exam    Constitutional: She appears well-developed and well-nourished.  HENT:  Head: Normocephalic.  Mouth/Throat: Oropharynx is clear and moist.  Eyes:  It is hard to do detail exam as patient does not cooperate but she has periorbital swelling and bruise. Tender on touch some redness in lateral part of eye. Moving her pupil normal.   Cardiovascular: Normal rate, regular rhythm and normal heart sounds.   Pulmonary/Chest: Effort normal.  Some rales B/L lower lungs.  Abdominal: Soft. Bowel sounds are normal. She exhibits no distension. There is no tenderness.  Musculoskeletal: She exhibits edema.    Labs reviewed:  Recent Labs  06/30/15 1712 12/01/15 0734 12/15/15 0730  NA 139 135 137  K 4.6 4.4 3.8  CL 98 108 105  CO2 23 23 28   GLUCOSE 159* 196* 153*  BUN 15 33* 15  CREATININE 0.95 1.02* 0.84  CALCIUM 9.5 8.7* 8.0*    Recent Labs  01/22/15 0710 03/24/15 1649 06/30/15 1712  AST 18 16 15   ALT 15 21 13   ALKPHOS 60 66 58  BILITOT 0.6 0.3 0.2  PROT 6.8 7.5 7.3  ALBUMIN 3.2* 3.7 3.5    Recent Labs  01/22/15 0710 02/07/15 0525 12/01/15 0734  WBC 6.2 6.3 7.5  NEUTROABS 3.0 3.9 4.9  HGB 13.0 13.6 12.0  HCT 40.8 41.5 37.4  MCV 92.5 93.9 92.3  PLT 222 201 300   Lab Results  Component Value Date   TSH 2.936 09/09/2011   Lab Results  Component Value Date   HGBA1C 7.3 06/30/2015   Lab Results  Component Value Date   CHOL 153 09/21/2011   HDL 47 09/21/2011   LDLCALC 76 09/21/2011   TRIG 148 09/21/2011   CHOLHDL 3.3 09/21/2011    Significant Diagnostic Results in last 30 days:  Dg Ribs Unilateral W/chest Left  Result Date: 01/13/2016 CLINICAL DATA:  Status post fall.  Lateral rib pain. EXAM: LEFT RIBS AND CHEST - 3+ VIEW COMPARISON:  Chest radiograph 02/05/2015 FINDINGS: Cardiac silhouette remains enlarged. No focal airspace consolidation or pulmonary edema. Left basilar atelectasis. No pneumothorax or sizable pleural effusion. No rib fracture is identified.  IMPRESSION: No displaced left rib fracture. Electronically Signed   By: Ulyses Jarred M.D.   On: 01/13/2016 23:29   Ct Head Wo Contrast  Result Date: 01/14/2016 CLINICAL DATA:  Fall, initial encounter. Fall at nursing facility after losing her footing. Headache and neck pain. EXAM: CT HEAD WITHOUT CONTRAST CT MAXILLOFACIAL WITHOUT CONTRAST CT CERVICAL SPINE WITHOUT CONTRAST TECHNIQUE: Multidetector CT imaging of the head, cervical spine, and maxillofacial structures were performed using the standard protocol without intravenous contrast. Multiplanar CT image reconstructions of the cervical spine and maxillofacial structures were also generated. Patient scanned with left side down, due to patient medical condition and tolerance. COMPARISON:  Head CT 12/31/2014 FINDINGS: CT HEAD FINDINGS Brain: Mild motion artifact through the frontal lobes. No evidence of acute infarction, hemorrhage, hydrocephalus, extra-axial collection or mass lesion/mass effect. Generalized atrophy and chronic small vessel ischemia, stable from prior exam. 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 Vascular: No hyperdense vessel or unexpected calcification. Skull: Right frontal scalp hematoma.  No calvarial fracture. Sinuses/Orbits: See dedicated face CT performed concurrently. CT MAXILLOFACIAL FINDINGS Osseous: Right orbital fracture, no additional facial bone fracture. Orbits: Minimally displaced fracture right inferior orbital floor. Wall thickening of the right inferior rectus muscle at the fracture site. Small fluid level in the right maxillary sinus. Globe is intact. There is right periorbital edema and laceration. No foreign body. Left orbit and globe are intact. Sinuses: Fluid level in the right maxillary sinus secondary to orbital fracture. Paranasal sinuses are otherwise clear. Soft tissues: Right periorbital soft tissue edema. Limited intracranial: See concurrent head CT. CT CERVICAL SPINE FINDINGS Alignment:  Straightening of normal lordosis. Minimal anterolisthesis of C3 on C4 and C4 on C5. No acute subluxation or malalignment. Skull base and vertebrae: No acute fracture. No primary bone lesion or focal pathologic process. Soft tissues and spinal canal: No prevertebral fluid or swelling. No visible canal hematoma. Disc levels:  Diffuse disc degeneration, most prominent at C5-C6. Multilevel facet arthropathy. Bone island noted in the dens. Upper chest: Negative. IMPRESSION: 1. Right frontal scalp hematoma. No calvarial fracture or acute intracranial abnormality. Stable atrophy. 2. Minimally displaced right infraorbital floor fracture. Mild thickening of the adjacent inferior rectus muscle without definite entrapment. 3. 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 Electronically Signed   By: Jeb Levering M.D.   On: 01/14/2016 03:12   Ct Cervical Spine Wo Contrast  Result Date: 01/14/2016 CLINICAL DATA:  Fall, initial encounter. Fall at nursing facility after losing her footing. Headache and neck pain. EXAM: CT HEAD WITHOUT CONTRAST CT MAXILLOFACIAL WITHOUT CONTRAST CT CERVICAL SPINE WITHOUT CONTRAST TECHNIQUE: Multidetector CT imaging of the head, cervical spine, and maxillofacial structures were performed using the standard protocol without intravenous contrast. Multiplanar CT image reconstructions of the cervical spine and maxillofacial structures were also generated. Patient scanned with left side down, due to patient medical condition and tolerance. COMPARISON:  Head CT 12/31/2014 FINDINGS: CT HEAD FINDINGS Brain: Mild motion artifact through the frontal lobes. No evidence of acute infarction, hemorrhage, hydrocephalus, extra-axial collection or mass lesion/mass effect. Generalized atrophy and chronic small vessel ischemia, stable from prior exam. 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 Vascular: No hyperdense vessel or unexpected  calcification. Skull: Right frontal scalp hematoma.  No calvarial fracture. Sinuses/Orbits: See dedicated face CT performed concurrently. CT MAXILLOFACIAL FINDINGS Osseous: Right orbital fracture, no additional facial bone fracture. Orbits: Minimally displaced fracture right inferior orbital floor. Wall thickening of the right inferior rectus muscle at the fracture site. Small fluid level in the right maxillary sinus. Globe is intact. There is right periorbital edema and laceration. No foreign body. Left orbit and globe are intact. Sinuses: Fluid level in the right maxillary sinus secondary to orbital fracture. Paranasal sinuses are otherwise clear. Soft tissues: Right periorbital soft tissue edema. Limited intracranial: See concurrent head CT. CT CERVICAL SPINE FINDINGS Alignment: Straightening of normal lordosis. Minimal anterolisthesis of C3 on C4 and C4 on C5. No acute subluxation or malalignment. Skull base and vertebrae: No acute fracture. No primary bone lesion or focal pathologic process. Soft tissues and spinal canal: No prevertebral fluid or swelling. No visible canal hematoma. Disc levels: Diffuse disc degeneration, most prominent at C5-C6. Multilevel facet arthropathy. Bone island noted in the dens. Upper chest: Negative. IMPRESSION: 1. Right frontal scalp hematoma. No calvarial fracture or acute intracranial abnormality. Stable atrophy. 2. Minimally displaced right infraorbital floor fracture. Mild thickening of the adjacent inferior rectus muscle without definite entrapment. 3. 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 Electronically Signed   By: Jeb Levering M.D.   On: 01/14/2016 03:12   Dg Hip Unilat W Or Wo Pelvis 2-3 Views Left  Result Date: 01/13/2016 CLINICAL DATA:  Fall from standing position.  Pain. EXAM: DG HIP (WITH OR WITHOUT PELVIS) 2-3V LEFT COMPARISON:  None. FINDINGS: The cortical margins of the bony pelvis and left hip  are intact. No fracture. Pubic symphysis and sacroiliac joints are congruent. Both femoral heads are well-seated in the respective acetabula. Vascular calcifications are seen. IMPRESSION: Negative for acute abnormality. Electronically Signed   By: Jeb Levering M.D.   On: 01/13/2016 23:26   Ct Maxillofacial Wo Contrast  Result Date: 01/14/2016 CLINICAL DATA:  Fall, initial encounter. Fall at nursing facility after losing her footing. Headache and neck pain. EXAM: CT HEAD WITHOUT CONTRAST CT MAXILLOFACIAL WITHOUT CONTRAST CT CERVICAL SPINE WITHOUT  CONTRAST TECHNIQUE: Multidetector CT imaging of the head, cervical spine, and maxillofacial structures were performed using the standard protocol without intravenous contrast. Multiplanar CT image reconstructions of the cervical spine and maxillofacial structures were also generated. Patient scanned with left side down, due to patient medical condition and tolerance. COMPARISON:  Head CT 12/31/2014 FINDINGS: CT HEAD FINDINGS Brain: Mild motion artifact through the frontal lobes. No evidence of acute infarction, hemorrhage, hydrocephalus, extra-axial collection or mass lesion/mass effect. Generalized atrophy and chronic small vessel ischemia, stable from prior exam. 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 Vascular: No hyperdense vessel or unexpected calcification. Skull: Right frontal scalp hematoma.  No calvarial fracture. Sinuses/Orbits: See dedicated face CT performed concurrently. CT MAXILLOFACIAL FINDINGS Osseous: Right orbital fracture, no additional facial bone fracture. Orbits: Minimally displaced fracture right inferior orbital floor. Wall thickening of the right inferior rectus muscle at the fracture site. Small fluid level in the right maxillary sinus. Globe is intact. There is right periorbital edema and laceration. No foreign body. Left orbit and globe are intact. Sinuses: Fluid level in the right maxillary sinus secondary to  orbital fracture. Paranasal sinuses are otherwise clear. Soft tissues: Right periorbital soft tissue edema. Limited intracranial: See concurrent head CT. CT CERVICAL SPINE FINDINGS Alignment: Straightening of normal lordosis. Minimal anterolisthesis of C3 on C4 and C4 on C5. No acute subluxation or malalignment. Skull base and vertebrae: No acute fracture. No primary bone lesion or focal pathologic process. Soft tissues and spinal canal: No prevertebral fluid or swelling. No visible canal hematoma. Disc levels: Diffuse disc degeneration, most prominent at C5-C6. Multilevel facet arthropathy. Bone island noted in the dens. Upper chest: Negative. IMPRESSION: 1. Right frontal scalp hematoma. No calvarial fracture or acute intracranial abnormality. Stable atrophy. 2. Minimally displaced right infraorbital floor fracture. Mild thickening of the adjacent inferior rectus muscle without definite entrapment. 3. 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 Electronically Signed   By: Jeb Levering M.D.   On: 01/14/2016 03:12    Assessment/Plan  Right Orbital Floor Fracture Due to Fall.  Patient was examined in ED and was found to have no Ocular nerve Entrapment. Unable to examine right now as she is not very Cooperative. Will have Follow up with Opthalmology. Norco PRN for Pain. Fall precautions  Diabetes Mellitus  BS has been running in 300s. Will increase the Basal insulin to 32 Units BID. Also start Sliding scale. Will also check HBA1C  Will repeat BMP and CBC also to r/o any other reason for fall.     Family/ staff Communication:   Labs/tests ordered:

## 2016-01-14 NOTE — ED Provider Notes (Signed)
Patient signed out pending CT scans. CT scan shows inferior orbital fracture. No other injuries noted. I have examined the patient. No evidence of ocular entrapment on exam. She has diffuse swelling over the right orbit and right for head. Laceration has been repaired. She is otherwise at her baseline. Discussed the findings with the patient and her family. They stated understanding. ENT follow-up provided for 2 weeks. Will discharge back to [facility].  After history, exam, and medical workup I feel the patient has been appropriately medically screened and is safe for discharge home. Pertinent diagnoses were discussed with the patient. Patient was given return precautions.  Results for orders placed or performed during the hospital encounter of 01/13/16  POC CBG, ED  Result Value Ref Range   Glucose-Capillary 399 (H) 65 - 99 mg/dL   Dg Ribs Unilateral W/chest Left  Result Date: 01/13/2016 CLINICAL DATA:  Status post fall.  Lateral rib pain. EXAM: LEFT RIBS AND CHEST - 3+ VIEW COMPARISON:  Chest radiograph 02/05/2015 FINDINGS: Cardiac silhouette remains enlarged. No focal airspace consolidation or pulmonary edema. Left basilar atelectasis. No pneumothorax or sizable pleural effusion. No rib fracture is identified. IMPRESSION: No displaced left rib fracture. Electronically Signed   By: Ulyses Jarred M.D.   On: 01/13/2016 23:29   Ct Head Wo Contrast  Result Date: 01/14/2016 CLINICAL DATA:  Fall, initial encounter. Fall at nursing facility after losing her footing. Headache and neck pain. EXAM: CT HEAD WITHOUT CONTRAST CT MAXILLOFACIAL WITHOUT CONTRAST CT CERVICAL SPINE WITHOUT CONTRAST TECHNIQUE: Multidetector CT imaging of the head, cervical spine, and maxillofacial structures were performed using the standard protocol without intravenous contrast. Multiplanar CT image reconstructions of the cervical spine and maxillofacial structures were also generated. Patient scanned with left side down, due to  patient medical condition and tolerance. COMPARISON:  Head CT 12/31/2014 FINDINGS: CT HEAD FINDINGS Brain: Mild motion artifact through the frontal lobes. No evidence of acute infarction, hemorrhage, hydrocephalus, extra-axial collection or mass lesion/mass effect. Generalized atrophy and chronic small vessel ischemia, stable from prior exam. 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 Vascular: No hyperdense vessel or unexpected calcification. Skull: Right frontal scalp hematoma.  No calvarial fracture. Sinuses/Orbits: See dedicated face CT performed concurrently. CT MAXILLOFACIAL FINDINGS Osseous: Right orbital fracture, no additional facial bone fracture. Orbits: Minimally displaced fracture right inferior orbital floor. Wall thickening of the right inferior rectus muscle at the fracture site. Small fluid level in the right maxillary sinus. Globe is intact. There is right periorbital edema and laceration. No foreign body. Left orbit and globe are intact. Sinuses: Fluid level in the right maxillary sinus secondary to orbital fracture. Paranasal sinuses are otherwise clear. Soft tissues: Right periorbital soft tissue edema. Limited intracranial: See concurrent head CT. CT CERVICAL SPINE FINDINGS Alignment: Straightening of normal lordosis. Minimal anterolisthesis of C3 on C4 and C4 on C5. No acute subluxation or malalignment. Skull base and vertebrae: No acute fracture. No primary bone lesion or focal pathologic process. Soft tissues and spinal canal: No prevertebral fluid or swelling. No visible canal hematoma. Disc levels: Diffuse disc degeneration, most prominent at C5-C6. Multilevel facet arthropathy. Bone island noted in the dens. Upper chest: Negative. IMPRESSION: 1. Right frontal scalp hematoma. No calvarial fracture or acute intracranial abnormality. Stable atrophy. 2. Minimally displaced right infraorbital floor fracture. Mild thickening of the adjacent inferior rectus muscle without  definite entrapment. 3. 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 Electronically Signed   By: Threasa Beards  Ehinger M.D.   On: 01/14/2016 03:12   Ct Cervical Spine Wo Contrast  Result Date: 01/14/2016 CLINICAL DATA:  Fall, initial encounter. Fall at nursing facility after losing her footing. Headache and neck pain. EXAM: CT HEAD WITHOUT CONTRAST CT MAXILLOFACIAL WITHOUT CONTRAST CT CERVICAL SPINE WITHOUT CONTRAST TECHNIQUE: Multidetector CT imaging of the head, cervical spine, and maxillofacial structures were performed using the standard protocol without intravenous contrast. Multiplanar CT image reconstructions of the cervical spine and maxillofacial structures were also generated. Patient scanned with left side down, due to patient medical condition and tolerance. COMPARISON:  Head CT 12/31/2014 FINDINGS: CT HEAD FINDINGS Brain: Mild motion artifact through the frontal lobes. No evidence of acute infarction, hemorrhage, hydrocephalus, extra-axial collection or mass lesion/mass effect. Generalized atrophy and chronic small vessel ischemia, stable from prior exam. 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 Vascular: No hyperdense vessel or unexpected calcification. Skull: Right frontal scalp hematoma.  No calvarial fracture. Sinuses/Orbits: See dedicated face CT performed concurrently. CT MAXILLOFACIAL FINDINGS Osseous: Right orbital fracture, no additional facial bone fracture. Orbits: Minimally displaced fracture right inferior orbital floor. Wall thickening of the right inferior rectus muscle at the fracture site. Small fluid level in the right maxillary sinus. Globe is intact. There is right periorbital edema and laceration. No foreign body. Left orbit and globe are intact. Sinuses: Fluid level in the right maxillary sinus secondary to orbital fracture. Paranasal sinuses are otherwise clear. Soft tissues: Right periorbital soft tissue  edema. Limited intracranial: See concurrent head CT. CT CERVICAL SPINE FINDINGS Alignment: Straightening of normal lordosis. Minimal anterolisthesis of C3 on C4 and C4 on C5. No acute subluxation or malalignment. Skull base and vertebrae: No acute fracture. No primary bone lesion or focal pathologic process. Soft tissues and spinal canal: No prevertebral fluid or swelling. No visible canal hematoma. Disc levels: Diffuse disc degeneration, most prominent at C5-C6. Multilevel facet arthropathy. Bone island noted in the dens. Upper chest: Negative. IMPRESSION: 1. Right frontal scalp hematoma. No calvarial fracture or acute intracranial abnormality. Stable atrophy. 2. Minimally displaced right infraorbital floor fracture. Mild thickening of the adjacent inferior rectus muscle without definite entrapment. 3. 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 Electronically Signed   By: Jeb Levering M.D.   On: 01/14/2016 03:12   Dg Hip Unilat W Or Wo Pelvis 2-3 Views Left  Result Date: 01/13/2016 CLINICAL DATA:  Fall from standing position.  Pain. EXAM: DG HIP (WITH OR WITHOUT PELVIS) 2-3V LEFT COMPARISON:  None. FINDINGS: The cortical margins of the bony pelvis and left hip are intact. No fracture. Pubic symphysis and sacroiliac joints are congruent. Both femoral heads are well-seated in the respective acetabula. Vascular calcifications are seen. IMPRESSION: Negative for acute abnormality. Electronically Signed   By: Jeb Levering M.D.   On: 01/13/2016 23:26   Ct Maxillofacial Wo Contrast  Result Date: 01/14/2016 CLINICAL DATA:  Fall, initial encounter. Fall at nursing facility after losing her footing. Headache and neck pain. EXAM: CT HEAD WITHOUT CONTRAST CT MAXILLOFACIAL WITHOUT CONTRAST CT CERVICAL SPINE WITHOUT CONTRAST TECHNIQUE: Multidetector CT imaging of the head, cervical spine, and maxillofacial structures were performed using the standard  protocol without intravenous contrast. Multiplanar CT image reconstructions of the cervical spine and maxillofacial structures were also generated. Patient scanned with left side down, due to patient medical condition and tolerance. COMPARISON:  Head CT 12/31/2014 FINDINGS: CT HEAD FINDINGS Brain: Mild motion artifact through the frontal lobes. No evidence of acute infarction, hemorrhage,  hydrocephalus, extra-axial collection or mass lesion/mass effect. Generalized atrophy and chronic small vessel ischemia, stable from prior exam. 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 Vascular: No hyperdense vessel or unexpected calcification. Skull: Right frontal scalp hematoma.  No calvarial fracture. Sinuses/Orbits: See dedicated face CT performed concurrently. CT MAXILLOFACIAL FINDINGS Osseous: Right orbital fracture, no additional facial bone fracture. Orbits: Minimally displaced fracture right inferior orbital floor. Wall thickening of the right inferior rectus muscle at the fracture site. Small fluid level in the right maxillary sinus. Globe is intact. There is right periorbital edema and laceration. No foreign body. Left orbit and globe are intact. Sinuses: Fluid level in the right maxillary sinus secondary to orbital fracture. Paranasal sinuses are otherwise clear. Soft tissues: Right periorbital soft tissue edema. Limited intracranial: See concurrent head CT. CT CERVICAL SPINE FINDINGS Alignment: Straightening of normal lordosis. Minimal anterolisthesis of C3 on C4 and C4 on C5. No acute subluxation or malalignment. Skull base and vertebrae: No acute fracture. No primary bone lesion or focal pathologic process. Soft tissues and spinal canal: No prevertebral fluid or swelling. No visible canal hematoma. Disc levels: Diffuse disc degeneration, most prominent at C5-C6. Multilevel facet arthropathy. Bone island noted in the dens. Upper chest: Negative. IMPRESSION: 1. Right frontal scalp hematoma. No  calvarial fracture or acute intracranial abnormality. Stable atrophy. 2. Minimally displaced right infraorbital floor fracture. Mild thickening of the adjacent inferior rectus muscle without definite entrapment. 3. 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 Electronically Signed   By: Jeb Levering M.D.   On: 01/14/2016 03:12      Merryl Hacker, MD 01/14/16 [phone]

## 2016-01-14 NOTE — ED Provider Notes (Signed)
Medical screening examination/treatment/procedure(s) were conducted as a shared visit with non-physician practitioner(s) and myself.  I personally evaluated the patient during the encounter. Briefly, the patient is a 75 y.o. female presents with head trauma following a mechanical fall. The patient with right forehead and laceration and right periorbital ecchymosis. No evidence of entrapment on exam. The wound is hemostatic. Patient is on Xarelto for A. fib and has baseline dementia. Per the daughter the patient is at her baseline. The wound was closed by PA. Patient is currently pending CT imaging to rule out intracranial hemorrhage or cervical fractures.  Patient care turned over to Dr Dina Rich.. Patient case and results discussed in detail; please see their note for further ED managment.      Fatima Blank, MD 01/15/16 [phone]

## 2016-01-14 NOTE — ED Notes (Signed)
Gave report to Denman George, nurse at [facility] for patient, patient's daughter signed discharge and given discharge papers.  [place] staff transported patient back to facility, no distress noted.

## 2016-01-15 ENCOUNTER — Encounter (HOSPITAL_COMMUNITY)
Admission: RE | Admit: 2016-01-15 | Discharge: 2016-01-15 | Disposition: A | Discharge status: Home / Self Care | Payer: Medicare Other | Source: Skilled Nursing Facility | Attending: Internal Medicine | Admitting: Internal Medicine

## 2016-01-15 DIAGNOSIS — Z978 Presence of other specified devices: Secondary | ICD-10-CM | POA: Diagnosis present

## 2016-01-15 DIAGNOSIS — S01111D Laceration without foreign body of right eyelid and periocular area, subsequent encounter: Secondary | ICD-10-CM | POA: Insufficient documentation

## 2016-01-15 DIAGNOSIS — X58XXXD Exposure to other specified factors, subsequent encounter: Secondary | ICD-10-CM | POA: Insufficient documentation

## 2016-01-15 DIAGNOSIS — I5042 Chronic combined systolic (congestive) and diastolic (congestive) heart failure: Secondary | ICD-10-CM | POA: Diagnosis present

## 2016-01-15 LAB — COMPREHENSIVE METABOLIC PANEL
ALBUMIN: 3.3 g/dL — AB (ref 3.5–5.0)
ALT: 16 U/L (ref 14–54)
ANION GAP: 7 (ref 5–15)
AST: 20 U/L (ref 15–41)
Alkaline Phosphatase: 61 U/L (ref 38–126)
BUN: 20 mg/dL (ref 6–20)
CO2: 27 mmol/L (ref 22–32)
Calcium: 9.1 mg/dL (ref 8.9–10.3)
Chloride: 105 mmol/L (ref 101–111)
Creatinine, Ser: 0.83 mg/dL (ref 0.44–1.00)
GFR calc non Af Amer: >60 mL/min (ref >60)
GLUCOSE: 196 mg/dL — AB (ref 65–99)
POTASSIUM: 4 mmol/L (ref 3.5–5.1)
SODIUM: 139 mmol/L (ref 135–145)
Total Bilirubin: 0.6 mg/dL (ref 0.3–1.2)
Total Protein: 7.6 g/dL (ref 6.5–8.1)

## 2016-01-15 LAB — CBC WITH DIFFERENTIAL/PLATELET
BASOS PCT: 1 %
Basophils Absolute: 0.1 10*3/uL (ref 0.0–0.1)
EOS ABS: 0.3 10*3/uL (ref 0.0–0.7)
EOS PCT: 4 %
HCT: 44.3 % (ref 36.0–46.0)
Hemoglobin: 14 g/dL (ref 12.0–15.0)
Lymphocytes Relative: 30 %
Lymphs Abs: 2.1 10*3/uL (ref 0.7–4.0)
MCH: 29.9 pg (ref 26.0–34.0)
MCHC: 31.6 g/dL (ref 30.0–36.0)
MCV: 94.7 fL (ref 78.0–100.0)
MONO ABS: 0.7 10*3/uL (ref 0.1–1.0)
MONOS PCT: 9 %
NEUTROS PCT: 56 %
Neutro Abs: 4 10*3/uL (ref 1.7–7.7)
PLATELETS: 194 10*3/uL (ref 150–400)
RBC: 4.68 MIL/uL (ref 3.87–5.11)
RDW: 14.5 % (ref 11.5–15.5)
WBC: 7.1 10*3/uL (ref 4.0–10.5)

## 2016-01-16 LAB — HEMOGLOBIN A1C
HEMOGLOBIN A1C: 8.6 % — AB (ref 4.8–5.6)
MEAN PLASMA GLUCOSE: 200 mg/dL

## 2016-01-19 ENCOUNTER — Non-Acute Institutional Stay (SKILLED_NURSING_FACILITY): Payer: Medicare Other | Admitting: Internal Medicine

## 2016-01-19 ENCOUNTER — Encounter: Payer: Self-pay | Admitting: Internal Medicine

## 2016-01-19 DIAGNOSIS — I87331 Chronic venous hypertension (idiopathic) with ulcer and inflammation of right lower extremity: Secondary | ICD-10-CM | POA: Diagnosis not present

## 2016-01-19 DIAGNOSIS — E1142 Type 2 diabetes mellitus with diabetic polyneuropathy: Secondary | ICD-10-CM | POA: Diagnosis not present

## 2016-01-19 DIAGNOSIS — L97919 Non-pressure chronic ulcer of unspecified part of right lower leg with unspecified severity: Secondary | ICD-10-CM

## 2016-01-19 DIAGNOSIS — F0391 Unspecified dementia with behavioral disturbance: Secondary | ICD-10-CM | POA: Diagnosis not present

## 2016-01-19 DIAGNOSIS — Z794 Long term (current) use of insulin: Secondary | ICD-10-CM

## 2016-01-19 DIAGNOSIS — W19XXXD Unspecified fall, subsequent encounter: Secondary | ICD-10-CM | POA: Diagnosis not present

## 2016-01-19 DIAGNOSIS — I4891 Unspecified atrial fibrillation: Secondary | ICD-10-CM | POA: Diagnosis not present

## 2016-01-19 DIAGNOSIS — F03918 Unspecified dementia, unspecified severity, with other behavioral disturbance: Secondary | ICD-10-CM

## 2016-01-19 NOTE — Progress Notes (Signed)
Location:   [facility] Room Number: 113/W Place of Service:  SNF (31) Provider:  Jefm Bryant, MD  Patient Care Team: Celedonio Savage, MD as PCP - General (Family Medicine) Yehuda Savannah, MD (Cardiology) Lendon Colonel, NP as Nurse Practitioner (Nurse Practitioner)  Extended Emergency Contact Information Primary Emergency Contact: [facility] Address: [address]          [place], [place] [address] [name] of [place] Phone: [phone] Mobile Phone: [phone] Relation: Daughter  Code Status:  DNR Goals of care: Advanced Directive information Advanced Directives 01/19/2016  Does patient have an advance directive? Yes  Type of Advance Directive Out of facility DNR (pink MOST or yellow form)  Does patient want to make changes to advanced directive? No - Patient declined  Copy of advanced directive(s) in chart? Yes     Chief Complaint  Patient presents with  . Acute Visit    Follow-up visit    HPI:  Pt is a 75 y.o. female seen today for an acute visit for Follow up from her fall and uncontrolled diabetes. Patient fell week ago and had orbital floor fracture with no Ocular nerve entrapment. Patient was evaluated for any occult infection . Her labs were normal with negative UA and no signs of any infection. She is doing better per Nurses. Patient is unable to give me anymore history due to her dementia. She thinks she is in [place] a end nobody is helping her. Her BS in morning are now running in 150 and in evening they are more then 250.   Past Medical History:  Diagnosis Date  . Allergy   . Atrial fibrillation ([facility]) 08/2011   First diagnosed in 08/2011; duration of arrhythmia is uncertain  . Bilateral lower extremity edema   . Chronic diarrhea    diverticulosis  . COPD (chronic obstructive pulmonary disease) ([facility])   . Dementia   . Diabetes mellitus, type 2 ([facility])    Diabetic neuropathy  . Dyspnea on exertion    pedal  edema  . Gout   . Headache(784.0)    twice weekly  . Hyperlipidemia   . Hypertension   . Osteopenia    DEXA scan 01/2010  . Palpitations   . Seasonal allergies   . Stress incontinence   . Vertigo    Past Surgical History:  Procedure Laterality Date  . APPENDECTOMY    . BREAST BIOPSY  2002   Right  . CESAREAN SECTION     x 2  . CHOLECYSTECTOMY    . COLONOSCOPY  2007   Negative screening study  . HAMMER TOE SURGERY     Bilateral hammer toe amputation  . INCISIONAL HERNIA REPAIR    . KNEE ARTHROSCOPY W/ MENISCAL REPAIR  2007   Bilateral  . UMBILICAL HERNIA REPAIR      Allergies  Allergen Reactions  . Codeine Anaphylaxis and Hives  . Morphine And Related Anaphylaxis and Hives  . Penicillins Anaphylaxis  . Ace Inhibitors Cough    Current Outpatient Prescriptions on File Prior to Visit  Medication Sig Dispense Refill  . acetaminophen (TYLENOL) 325 MG tablet Take 650 mg by mouth every 6 (six) hours as needed.    . Amino Acids-Protein Hydrolys (FEEDING SUPPLEMENT, PRO-STAT SUGAR FREE 64,) LIQD Take 30 mLs by mouth daily.    . cetirizine (ZYRTEC) 10 MG tablet Take 1 tablet (10 mg total) by mouth daily. 90 tablet 3  . cholecalciferol (VITAMIN D) 1000 UNITS tablet  Take 1,000 Units by mouth daily.    . citalopram (CELEXA) 20 MG tablet Take 1 tablet (20 mg total) by mouth daily. 90 tablet 3  . digoxin (LANOXIN) 0.125 MG tablet Take 1 tablet by mouth once a day (HOLD FOR AP UNDER 60)    . furosemide (LASIX) 20 MG tablet Take 20 mg by mouth.    . Insulin Isophane & Regular Human (HUMULIN 70/30 KWIKPEN) (70-30) 100 UNIT/ML PEN Give 32 units subcutaneous twice a day    . Insulin Pen Needle (EASY TOUCH PEN NEEDLES) 31G X 8 MM MISC Use twice a day    . LORazepam (ATIVAN) 0.5 MG tablet Take one tablet by mouth every 8 hours as needed for anxiety 90 tablet 0  . memantine (NAMENDA XR) 14 MG CP24 24 hr capsule Take 14 mg by mouth daily.    . metFORMIN (GLUCOPHAGE) 500 MG tablet Take 500  mg by mouth daily.    . metoprolol (LOPRESSOR) 100 MG tablet Take 1 tablet (100 mg total) by mouth 2 (two) times daily. 180 tablet 3  . Multiple Vitamin (MULTIVITAMIN WITH MINERALS) TABS Take 1 tablet by mouth every morning.    Marland Kitchen POTASSIUM CHLORIDE ER PO Potassium chloride extended release capsule, give 10 meq by mouth once day    . pravastatin (PRAVACHOL) 40 MG tablet Take 1 tablet (40 mg total) by mouth every evening. 90 tablet 3  . Probiotic Product (RISA-BID PROBIOTIC PO) Take 1 tablet by mouth twice a day    . promethazine (PHENERGAN) 12.5 MG tablet Take 12.5 mg by mouth every 8 (eight) hours as needed for nausea or vomiting.    . traZODone (DESYREL) 50 MG tablet Take 50 mg by mouth at bedtime. As needed for sleep.    Alveda Reasons 20 MG TABS tablet TAKE (1) TABLET BY MOUTH ONCE DAILY. 30 tablet 2  . [DISCONTINUED] K-DUR 20 MEQ tablet TAKE 2 TABLETS BY MOUTH ONCE DAILY. 60 tablet 5   No current facility-administered medications on file prior to visit.      Review of Systems  Unable to perform ROS: Dementia     There is no immunization history on file for this patient. Pertinent  Health Maintenance Due  Topic Date Due  . INFLUENZA VACCINE  04/01/2016 (Originally 12/01/2015)  . FOOT EXAM  11/29/2016 (Originally 03/18/1951)  . OPHTHALMOLOGY EXAM  11/29/2016 (Originally 03/18/1951)  . DEXA SCAN  11/29/2016 (Originally 03/18/2005)  . PNA vac Low Risk Adult (1 of 2 - PCV13) 11/29/2016 (Originally 03/18/2005)  . COLONOSCOPY  11/29/2025 (Originally 03/18/1990)  . URINE MICROALBUMIN  02/24/2016  . HEMOGLOBIN A1C  07/14/2016   Fall Risk  11/06/2015 09/29/2015 08/31/2015 06/30/2015 02/24/2015  Falls in the past year? Yes No No No Yes  Number falls in past yr: 2 or more - - - 2 or more  Injury with Fall? No - - - No   Functional Status Survey:    Vitals:   01/19/16 0957  BP: 126/73  Pulse: 80  Resp: 18  Temp: 97.1 F (36.2 C)  TempSrc: Oral   There is no height or weight on file to  calculate BMI. Physical Exam  Constitutional: She appears well-developed and well-nourished.  HENT:  Head: Normocephalic.  Mouth/Throat: Oropharynx is clear and moist.  Eyes:  Her Right eye has healing bruise. No restriction to Eye movement. Also  has  Bruise on right cheek.  Cardiovascular: Normal rate and normal heart sounds.   Pulmonary/Chest: She has rales.  Lower  Left lobe  Abdominal: Soft. Bowel sounds are normal. She exhibits no distension. There is no tenderness. There is no rebound.  Musculoskeletal: She exhibits edema.  Neurological: She is alert.  Not Oriented  Skin:  Examined her Chronic venous stasis ulcers with the nurse.Her both feet were cold but had peripheral pulses. Left leg has completely healed. Right still has two areas which have crusting and healing. Both legs have chronic venous changes.    Labs reviewed:  Recent Labs  12/01/15 0734 12/15/15 0730 01/15/16 0710  NA 135 137 139  K 4.4 3.8 4.0  CL 108 105 105  CO2 23 28 27   GLUCOSE 196* 153* 196*  BUN 33* 15 20  CREATININE 1.02* 0.84 0.83  CALCIUM 8.7* 8.0* 9.1    Recent Labs  03/24/15 1649 06/30/15 1712 01/15/16 0710  AST 16 15 20   ALT 21 13 16   ALKPHOS 66 58 61  BILITOT 0.3 0.2 0.6  PROT 7.5 7.3 7.6  ALBUMIN 3.7 3.5 3.3*    Recent Labs  02/07/15 0525 12/01/15 0734 01/15/16 0500  WBC 6.3 7.5 7.1  NEUTROABS 3.9 4.9 4.0  HGB 13.6 12.0 14.0  HCT 41.5 37.4 44.3  MCV 93.9 92.3 94.7  PLT 201 300 194   Lab Results  Component Value Date   TSH 2.936 09/09/2011   Lab Results  Component Value Date   HGBA1C 8.6 (H) 01/15/2016   Lab Results  Component Value Date   CHOL 153 09/21/2011   HDL 47 09/21/2011   LDLCALC 76 09/21/2011   TRIG 148 09/21/2011   CHOLHDL 3.3 09/21/2011    Significant Diagnostic Results in last 30 days:  Dg Ribs Unilateral W/chest Left  Result Date: 01/13/2016 CLINICAL DATA:  Status post fall.  Lateral rib pain. EXAM: LEFT RIBS AND CHEST - 3+ VIEW  COMPARISON:  Chest radiograph 02/05/2015 FINDINGS: Cardiac silhouette remains enlarged. No focal airspace consolidation or pulmonary edema. Left basilar atelectasis. No pneumothorax or sizable pleural effusion. No rib fracture is identified. IMPRESSION: No displaced left rib fracture. Electronically Signed   By: Ulyses Jarred M.D.   On: 01/13/2016 23:29   Ct Head Wo Contrast  Result Date: 01/14/2016 CLINICAL DATA:  Fall, initial encounter. Fall at nursing facility after losing her footing. Headache and neck pain. EXAM: CT HEAD WITHOUT CONTRAST CT MAXILLOFACIAL WITHOUT CONTRAST CT CERVICAL SPINE WITHOUT CONTRAST TECHNIQUE: Multidetector CT imaging of the head, cervical spine, and maxillofacial structures were performed using the standard protocol without intravenous contrast. Multiplanar CT image reconstructions of the cervical spine and maxillofacial structures were also generated. Patient scanned with left side down, due to patient medical condition and tolerance. COMPARISON:  Head CT 12/31/2014 FINDINGS: CT HEAD FINDINGS Brain: Mild motion artifact through the frontal lobes. No evidence of acute infarction, hemorrhage, hydrocephalus, extra-axial collection or mass lesion/mass effect. Generalized atrophy and chronic small vessel ischemia, stable from prior exam. 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 Vascular: No hyperdense vessel or unexpected calcification. Skull: Right frontal scalp hematoma.  No calvarial fracture. Sinuses/Orbits: See dedicated face CT performed concurrently. CT MAXILLOFACIAL FINDINGS Osseous: Right orbital fracture, no additional facial bone fracture. Orbits: Minimally displaced fracture right inferior orbital floor. Wall thickening of the right inferior rectus muscle at the fracture site. Small fluid level in the right maxillary sinus. Globe is intact. There is right periorbital edema and laceration. No foreign body. Left orbit and globe are intact. Sinuses: Fluid  level in the right maxillary sinus secondary to orbital fracture. Paranasal  sinuses are otherwise clear. Soft tissues: Right periorbital soft tissue edema. Limited intracranial: See concurrent head CT. CT CERVICAL SPINE FINDINGS Alignment: Straightening of normal lordosis. Minimal anterolisthesis of C3 on C4 and C4 on C5. No acute subluxation or malalignment. Skull base and vertebrae: No acute fracture. No primary bone lesion or focal pathologic process. Soft tissues and spinal canal: No prevertebral fluid or swelling. No visible canal hematoma. Disc levels: Diffuse disc degeneration, most prominent at C5-C6. Multilevel facet arthropathy. Bone island noted in the dens. Upper chest: Negative. IMPRESSION: 1. Right frontal scalp hematoma. No calvarial fracture or acute intracranial abnormality. Stable atrophy. 2. Minimally displaced right infraorbital floor fracture. Mild thickening of the adjacent inferior rectus muscle without definite entrapment. 3. 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 Electronically Signed   By: Jeb Levering M.D.   On: 01/14/2016 03:12   Ct Cervical Spine Wo Contrast  Result Date: 01/14/2016 CLINICAL DATA:  Fall, initial encounter. Fall at nursing facility after losing her footing. Headache and neck pain. EXAM: CT HEAD WITHOUT CONTRAST CT MAXILLOFACIAL WITHOUT CONTRAST CT CERVICAL SPINE WITHOUT CONTRAST TECHNIQUE: Multidetector CT imaging of the head, cervical spine, and maxillofacial structures were performed using the standard protocol without intravenous contrast. Multiplanar CT image reconstructions of the cervical spine and maxillofacial structures were also generated. Patient scanned with left side down, due to patient medical condition and tolerance. COMPARISON:  Head CT 12/31/2014 FINDINGS: CT HEAD FINDINGS Brain: Mild motion artifact through the frontal lobes. No evidence of acute infarction, hemorrhage, hydrocephalus,  extra-axial collection or mass lesion/mass effect. Generalized atrophy and chronic small vessel ischemia, stable from prior exam. 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 Vascular: No hyperdense vessel or unexpected calcification. Skull: Right frontal scalp hematoma.  No calvarial fracture. Sinuses/Orbits: See dedicated face CT performed concurrently. CT MAXILLOFACIAL FINDINGS Osseous: Right orbital fracture, no additional facial bone fracture. Orbits: Minimally displaced fracture right inferior orbital floor. Wall thickening of the right inferior rectus muscle at the fracture site. Small fluid level in the right maxillary sinus. Globe is intact. There is right periorbital edema and laceration. No foreign body. Left orbit and globe are intact. Sinuses: Fluid level in the right maxillary sinus secondary to orbital fracture. Paranasal sinuses are otherwise clear. Soft tissues: Right periorbital soft tissue edema. Limited intracranial: See concurrent head CT. CT CERVICAL SPINE FINDINGS Alignment: Straightening of normal lordosis. Minimal anterolisthesis of C3 on C4 and C4 on C5. No acute subluxation or malalignment. Skull base and vertebrae: No acute fracture. No primary bone lesion or focal pathologic process. Soft tissues and spinal canal: No prevertebral fluid or swelling. No visible canal hematoma. Disc levels: Diffuse disc degeneration, most prominent at C5-C6. Multilevel facet arthropathy. Bone island noted in the dens. Upper chest: Negative. IMPRESSION: 1. Right frontal scalp hematoma. No calvarial fracture or acute intracranial abnormality. Stable atrophy. 2. Minimally displaced right infraorbital floor fracture. Mild thickening of the adjacent inferior rectus muscle without definite entrapment. 3. 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 Electronically Signed   By: Jeb Levering M.D.   On: 01/14/2016 03:12   Dg Hip Unilat W  Or Wo Pelvis 2-3 Views Left  Result Date: 01/13/2016 CLINICAL DATA:  Fall from standing position.  Pain. EXAM: DG HIP (WITH OR WITHOUT PELVIS) 2-3V LEFT COMPARISON:  None. FINDINGS: The cortical margins of the bony pelvis and left hip are intact. No fracture. Pubic symphysis and sacroiliac joints are congruent. Both  femoral heads are well-seated in the respective acetabula. Vascular calcifications are seen. IMPRESSION: Negative for acute abnormality. Electronically Signed   By: Jeb Levering M.D.   On: 01/13/2016 23:26   Ct Maxillofacial Wo Contrast  Result Date: 01/14/2016 CLINICAL DATA:  Fall, initial encounter. Fall at nursing facility after losing her footing. Headache and neck pain. EXAM: CT HEAD WITHOUT CONTRAST CT MAXILLOFACIAL WITHOUT CONTRAST CT CERVICAL SPINE WITHOUT CONTRAST TECHNIQUE: Multidetector CT imaging of the head, cervical spine, and maxillofacial structures were performed using the standard protocol without intravenous contrast. Multiplanar CT image reconstructions of the cervical spine and maxillofacial structures were also generated. Patient scanned with left side down, due to patient medical condition and tolerance. COMPARISON:  Head CT 12/31/2014 FINDINGS: CT HEAD FINDINGS Brain: Mild motion artifact through the frontal lobes. No evidence of acute infarction, hemorrhage, hydrocephalus, extra-axial collection or mass lesion/mass effect. Generalized atrophy and chronic small vessel ischemia, stable from prior exam. 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 Vascular: No hyperdense vessel or unexpected calcification. Skull: Right frontal scalp hematoma.  No calvarial fracture. Sinuses/Orbits: See dedicated face CT performed concurrently. CT MAXILLOFACIAL FINDINGS Osseous: Right orbital fracture, no additional facial bone fracture. Orbits: Minimally displaced fracture right inferior orbital floor. Wall thickening of the right inferior rectus muscle at the fracture  site. Small fluid level in the right maxillary sinus. Globe is intact. There is right periorbital edema and laceration. No foreign body. Left orbit and globe are intact. Sinuses: Fluid level in the right maxillary sinus secondary to orbital fracture. Paranasal sinuses are otherwise clear. Soft tissues: Right periorbital soft tissue edema. Limited intracranial: See concurrent head CT. CT CERVICAL SPINE FINDINGS Alignment: Straightening of normal lordosis. Minimal anterolisthesis of C3 on C4 and C4 on C5. No acute subluxation or malalignment. Skull base and vertebrae: No acute fracture. No primary bone lesion or focal pathologic process. Soft tissues and spinal canal: No prevertebral fluid or swelling. No visible canal hematoma. Disc levels: Diffuse disc degeneration, most prominent at C5-C6. Multilevel facet arthropathy. Bone island noted in the dens. Upper chest: Negative. IMPRESSION: 1. Right frontal scalp hematoma. No calvarial fracture or acute intracranial abnormality. Stable atrophy. 2. Minimally displaced right infraorbital floor fracture. Mild thickening of the adjacent inferior rectus muscle without definite entrapment. 3. 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 Electronically Signed   By: Jeb Levering M.D.   On: 01/14/2016 03:12    Assessment/Plan  Diabetes mellitus  Her BS have improved since her Insulin was increased and Sliding scale was started. Her morning BS are in 130-150 evening BS are more then 250. Will Increase morning Insulin to 35 units. Continue same dose for evening. Patient also on metformin. Renal function stable. Her  HBA1c is 8.6  Status Post Fall Patient Doing well. Continue to monitor. Has Opthalmology consult schedule for this Friday.  Chronic venous dermatitis .  Will continue wrapping her right leg. Will have to start using compression stockings if patient would let them use it.  Atrial fibrilation    Patient on Xarelto. Rate controlled on Lopressor.  Dementia Continue Namenda.  Family/ staff Communication:   Labs/tests ordered:

## 2016-01-28 ENCOUNTER — Encounter: Payer: Self-pay | Admitting: Internal Medicine

## 2016-01-28 ENCOUNTER — Non-Acute Institutional Stay (SKILLED_NURSING_FACILITY): Payer: Medicare Other | Admitting: Internal Medicine

## 2016-01-28 DIAGNOSIS — Z794 Long term (current) use of insulin: Secondary | ICD-10-CM

## 2016-01-28 DIAGNOSIS — E1142 Type 2 diabetes mellitus with diabetic polyneuropathy: Secondary | ICD-10-CM

## 2016-01-28 DIAGNOSIS — F03918 Unspecified dementia, unspecified severity, with other behavioral disturbance: Secondary | ICD-10-CM

## 2016-01-28 DIAGNOSIS — F0391 Unspecified dementia with behavioral disturbance: Secondary | ICD-10-CM | POA: Diagnosis not present

## 2016-01-28 NOTE — Progress Notes (Signed)
Location:   [facility] Room Number: 113/W Place of Service:  SNF (31) Provider:  Jefm Bryant, MD  Patient Care Team: Celedonio Savage, MD as PCP - General (Family Medicine) Yehuda Savannah, MD (Cardiology) Lendon Colonel, NP as Nurse Practitioner (Nurse Practitioner)  Extended Emergency Contact Information Primary Emergency Contact: [facility] Address: [address]          [place], [place] [address] [name] of [place] Phone: [phone] Mobile Phone: [phone] Relation: Daughter  Code Status:  DNR Goals of care: Advanced Directive information Advanced Directives 01/28/2016  Does patient have an advance directive? Yes  Type of Advance Directive Out of facility DNR (pink MOST or yellow form)  Does patient want to make changes to advanced directive? No - Patient declined  Copy of advanced directive(s) in chart? Yes     Chief Complaint  Patient presents with  . Acute Visit    Blood Sugar    HPI:  Pt is a 75 y.o. female seen today for an acute visit for high Bs. Patient doing well. Did not have any complains. Hard to get much history due to dementia.Her BS are still running more then 200 in the evening and more then 150 in morning. Patient was ruled out of for any occult infection. Her last HgbA1C was 8.6   Past Medical History:  Diagnosis Date  . Allergy   . Atrial fibrillation ([place]) 08/2011   First diagnosed in 08/2011; duration of arrhythmia is uncertain  . Bilateral lower extremity edema   . Chronic diarrhea    diverticulosis  . COPD (chronic obstructive pulmonary disease) ([place])   . Dementia   . Diabetes mellitus, type 2 ([facility])    Diabetic neuropathy  . Dyspnea on exertion    pedal edema  . Gout   . Headache(784.0)    twice weekly  . Hyperlipidemia   . Hypertension   . Osteopenia    DEXA scan 01/2010  . Palpitations   . Seasonal allergies   . Stress incontinence   . Vertigo    Past Surgical History:    Procedure Laterality Date  . APPENDECTOMY    . BREAST BIOPSY  2002   Right  . CESAREAN SECTION     x 2  . CHOLECYSTECTOMY    . COLONOSCOPY  2007   Negative screening study  . HAMMER TOE SURGERY     Bilateral hammer toe amputation  . INCISIONAL HERNIA REPAIR    . KNEE ARTHROSCOPY W/ MENISCAL REPAIR  2007   Bilateral  . UMBILICAL HERNIA REPAIR      Allergies  Allergen Reactions  . Codeine Anaphylaxis and Hives  . Morphine And Related Anaphylaxis and Hives  . Penicillins Anaphylaxis  . Ace Inhibitors Cough    Current Outpatient Prescriptions on File Prior to Visit  Medication Sig Dispense Refill  . acetaminophen (TYLENOL) 325 MG tablet Take 650 mg by mouth every 6 (six) hours as needed.    . Amino Acids-Protein Hydrolys (FEEDING SUPPLEMENT, PRO-STAT SUGAR FREE 64,) LIQD Take 30 mLs by mouth daily.    . cetirizine (ZYRTEC) 10 MG tablet Take 1 tablet (10 mg total) by mouth daily. 90 tablet 3  . cholecalciferol (VITAMIN D) 1000 UNITS tablet Take 1,000 Units by mouth daily.    . citalopram (CELEXA) 20 MG tablet Take 1 tablet (20 mg total) by mouth daily. 90 tablet 3  . digoxin (LANOXIN) 0.125 MG tablet Take 1 tablet by mouth once a  day (HOLD FOR AP UNDER 60)    . docusate sodium (COLACE) 100 MG capsule Take 200 mg by mouth 2 (two) times daily.    . furosemide (LASIX) 20 MG tablet Take 20 mg by mouth.    . Insulin Isophane & Regular Human (HUMULIN 70/30 KWIKPEN) (70-30) 100 UNIT/ML PEN Give 35 units subcutaneous in mornings. Give 32 units in the evening.    . insulin NPH Human (NOVOLIN N) 100 UNIT/ML injection Give per sliding scale    . Insulin Pen Needle (EASY TOUCH PEN NEEDLES) 31G X 8 MM MISC Use twice a day    . LORazepam (ATIVAN) 0.5 MG tablet Take one tablet by mouth every 8 hours as needed for anxiety 90 tablet 0  . memantine (NAMENDA XR) 14 MG CP24 24 hr capsule Take 14 mg by mouth daily.    . metFORMIN (GLUCOPHAGE) 500 MG tablet Take 500 mg by mouth daily.    . metoprolol  (LOPRESSOR) 100 MG tablet Take 1 tablet (100 mg total) by mouth 2 (two) times daily. 180 tablet 3  . Multiple Vitamin (MULTIVITAMIN WITH MINERALS) TABS Take 1 tablet by mouth every morning.    Marland Kitchen POTASSIUM CHLORIDE ER PO Potassium chloride extended release capsule, give 10 meq by mouth once day    . pravastatin (PRAVACHOL) 40 MG tablet Take 1 tablet (40 mg total) by mouth every evening. 90 tablet 3  . Probiotic Product (RISA-BID PROBIOTIC PO) Take 1 tablet by mouth twice a day    . promethazine (PHENERGAN) 12.5 MG tablet Take 12.5 mg by mouth every 8 (eight) hours as needed for nausea or vomiting.    . traZODone (DESYREL) 50 MG tablet Take 50 mg by mouth at bedtime. As needed for sleep.    Alveda Reasons 20 MG TABS tablet TAKE (1) TABLET BY MOUTH ONCE DAILY. 30 tablet 2  . [DISCONTINUED] K-DUR 20 MEQ tablet TAKE 2 TABLETS BY MOUTH ONCE DAILY. 60 tablet 5   No current facility-administered medications on file prior to visit.      Review of Systems  Unable to perform ROS: Dementia     There is no immunization history on file for this patient. Pertinent  Health Maintenance Due  Topic Date Due  . INFLUENZA VACCINE  04/01/2016 (Originally 12/01/2015)  . FOOT EXAM  11/29/2016 (Originally 03/18/1951)  . OPHTHALMOLOGY EXAM  11/29/2016 (Originally 03/18/1951)  . DEXA SCAN  11/29/2016 (Originally 03/18/2005)  . PNA vac Low Risk Adult (1 of 2 - PCV13) 11/29/2016 (Originally 03/18/2005)  . COLONOSCOPY  11/29/2025 (Originally 03/18/1990)  . URINE MICROALBUMIN  02/24/2016  . HEMOGLOBIN A1C  07/14/2016   Fall Risk  11/06/2015 09/29/2015 08/31/2015 06/30/2015 02/24/2015  Falls in the past year? Yes No No No Yes  Number falls in past yr: 2 or more - - - 2 or more  Injury with Fall? No - - - No   Functional Status Survey:    Vitals:   01/28/16 1221  BP: 117/78  Pulse: 90  Resp: 20  Temp: 98.2 F (36.8 C)  TempSrc: Oral   There is no height or weight on file to calculate BMI. Physical Exam    Constitutional: She appears well-developed and well-nourished.  HENT:  Head: Normocephalic.  Cardiovascular: Normal rate and normal heart sounds.   Pulmonary/Chest: Breath sounds normal. No respiratory distress. She has no wheezes. She has no rales.  Abdominal: Soft. Bowel sounds are normal. She exhibits no distension. There is no tenderness. There is no rebound.  Musculoskeletal: She exhibits edema.  Neurological: She is alert.    Labs reviewed:  Recent Labs  12/01/15 0734 12/15/15 0730 01/15/16 0710  NA 135 137 139  K 4.4 3.8 4.0  CL 108 105 105  CO2 23 28 27   GLUCOSE 196* 153* 196*  BUN 33* 15 20  CREATININE 1.02* 0.84 0.83  CALCIUM 8.7* 8.0* 9.1    Recent Labs  03/24/15 1649 06/30/15 1712 01/15/16 0710  AST 16 15 20   ALT 21 13 16   ALKPHOS 66 58 61  BILITOT 0.3 0.2 0.6  PROT 7.5 7.3 7.6  ALBUMIN 3.7 3.5 3.3*    Recent Labs  02/07/15 0525 12/01/15 0734 01/15/16 0500  WBC 6.3 7.5 7.1  NEUTROABS 3.9 4.9 4.0  HGB 13.6 12.0 14.0  HCT 41.5 37.4 44.3  MCV 93.9 92.3 94.7  PLT 201 300 194   Lab Results  Component Value Date   TSH 2.936 09/09/2011   Lab Results  Component Value Date   HGBA1C 8.6 (H) 01/15/2016   Lab Results  Component Value Date   CHOL 153 09/21/2011   HDL 47 09/21/2011   LDLCALC 76 09/21/2011   TRIG 148 09/21/2011   CHOLHDL 3.3 09/21/2011    Significant Diagnostic Results in last 30 days:  Dg Ribs Unilateral W/chest Left  Result Date: 01/13/2016 CLINICAL DATA:  Status post fall.  Lateral rib pain. EXAM: LEFT RIBS AND CHEST - 3+ VIEW COMPARISON:  Chest radiograph 02/05/2015 FINDINGS: Cardiac silhouette remains enlarged. No focal airspace consolidation or pulmonary edema. Left basilar atelectasis. No pneumothorax or sizable pleural effusion. No rib fracture is identified. IMPRESSION: No displaced left rib fracture. Electronically Signed   By: Ulyses Jarred M.D.   On: 01/13/2016 23:29   Ct Head Wo Contrast  Result Date:  01/14/2016 CLINICAL DATA:  Fall, initial encounter. Fall at nursing facility after losing her footing. Headache and neck pain. EXAM: CT HEAD WITHOUT CONTRAST CT MAXILLOFACIAL WITHOUT CONTRAST CT CERVICAL SPINE WITHOUT CONTRAST TECHNIQUE: Multidetector CT imaging of the head, cervical spine, and maxillofacial structures were performed using the standard protocol without intravenous contrast. Multiplanar CT image reconstructions of the cervical spine and maxillofacial structures were also generated. Patient scanned with left side down, due to patient medical condition and tolerance. COMPARISON:  Head CT 12/31/2014 FINDINGS: CT HEAD FINDINGS Brain: Mild motion artifact through the frontal lobes. No evidence of acute infarction, hemorrhage, hydrocephalus, extra-axial collection or mass lesion/mass effect. Generalized atrophy and chronic small vessel ischemia, stable from prior exam. 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 Vascular: No hyperdense vessel or unexpected calcification. Skull: Right frontal scalp hematoma.  No calvarial fracture. Sinuses/Orbits: See dedicated face CT performed concurrently. CT MAXILLOFACIAL FINDINGS Osseous: Right orbital fracture, no additional facial bone fracture. Orbits: Minimally displaced fracture right inferior orbital floor. Wall thickening of the right inferior rectus muscle at the fracture site. Small fluid level in the right maxillary sinus. Globe is intact. There is right periorbital edema and laceration. No foreign body. Left orbit and globe are intact. Sinuses: Fluid level in the right maxillary sinus secondary to orbital fracture. Paranasal sinuses are otherwise clear. Soft tissues: Right periorbital soft tissue edema. Limited intracranial: See concurrent head CT. CT CERVICAL SPINE FINDINGS Alignment: Straightening of normal lordosis. Minimal anterolisthesis of C3 on C4 and C4 on C5. No acute subluxation or malalignment. Skull base and vertebrae: No  acute fracture. No primary bone lesion or focal pathologic process. Soft tissues and spinal canal: No prevertebral fluid or swelling.  No visible canal hematoma. Disc levels: Diffuse disc degeneration, most prominent at C5-C6. Multilevel facet arthropathy. Bone island noted in the dens. Upper chest: Negative. IMPRESSION: 1. Right frontal scalp hematoma. No calvarial fracture or acute intracranial abnormality. Stable atrophy. 2. Minimally displaced right infraorbital floor fracture. Mild thickening of the adjacent inferior rectus muscle without definite entrapment. 3. 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 Electronically Signed   By: Jeb Levering M.D.   On: 01/14/2016 03:12   Ct Cervical Spine Wo Contrast  Result Date: 01/14/2016 CLINICAL DATA:  Fall, initial encounter. Fall at nursing facility after losing her footing. Headache and neck pain. EXAM: CT HEAD WITHOUT CONTRAST CT MAXILLOFACIAL WITHOUT CONTRAST CT CERVICAL SPINE WITHOUT CONTRAST TECHNIQUE: Multidetector CT imaging of the head, cervical spine, and maxillofacial structures were performed using the standard protocol without intravenous contrast. Multiplanar CT image reconstructions of the cervical spine and maxillofacial structures were also generated. Patient scanned with left side down, due to patient medical condition and tolerance. COMPARISON:  Head CT 12/31/2014 FINDINGS: CT HEAD FINDINGS Brain: Mild motion artifact through the frontal lobes. No evidence of acute infarction, hemorrhage, hydrocephalus, extra-axial collection or mass lesion/mass effect. Generalized atrophy and chronic small vessel ischemia, stable from prior exam. 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 Vascular: No hyperdense vessel or unexpected calcification. Skull: Right frontal scalp hematoma.  No calvarial fracture. Sinuses/Orbits: See dedicated face CT performed concurrently. CT  MAXILLOFACIAL FINDINGS Osseous: Right orbital fracture, no additional facial bone fracture. Orbits: Minimally displaced fracture right inferior orbital floor. Wall thickening of the right inferior rectus muscle at the fracture site. Small fluid level in the right maxillary sinus. Globe is intact. There is right periorbital edema and laceration. No foreign body. Left orbit and globe are intact. Sinuses: Fluid level in the right maxillary sinus secondary to orbital fracture. Paranasal sinuses are otherwise clear. Soft tissues: Right periorbital soft tissue edema. Limited intracranial: See concurrent head CT. CT CERVICAL SPINE FINDINGS Alignment: Straightening of normal lordosis. Minimal anterolisthesis of C3 on C4 and C4 on C5. No acute subluxation or malalignment. Skull base and vertebrae: No acute fracture. No primary bone lesion or focal pathologic process. Soft tissues and spinal canal: No prevertebral fluid or swelling. No visible canal hematoma. Disc levels: Diffuse disc degeneration, most prominent at C5-C6. Multilevel facet arthropathy. Bone island noted in the dens. Upper chest: Negative. IMPRESSION: 1. Right frontal scalp hematoma. No calvarial fracture or acute intracranial abnormality. Stable atrophy. 2. Minimally displaced right infraorbital floor fracture. Mild thickening of the adjacent inferior rectus muscle without definite entrapment. 3. 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 Electronically Signed   By: Jeb Levering M.D.   On: 01/14/2016 03:12   Dg Hip Unilat W Or Wo Pelvis 2-3 Views Left  Result Date: 01/13/2016 CLINICAL DATA:  Fall from standing position.  Pain. EXAM: DG HIP (WITH OR WITHOUT PELVIS) 2-3V LEFT COMPARISON:  None. FINDINGS: The cortical margins of the bony pelvis and left hip are intact. No fracture. Pubic symphysis and sacroiliac joints are congruent. Both femoral heads are well-seated in the respective acetabula.  Vascular calcifications are seen. IMPRESSION: Negative for acute abnormality. Electronically Signed   By: Jeb Levering M.D.   On: 01/13/2016 23:26   Ct Maxillofacial Wo Contrast  Result Date: 01/14/2016 CLINICAL DATA:  Fall, initial encounter. Fall at nursing facility after losing her footing. Headache and neck pain. EXAM: CT HEAD WITHOUT CONTRAST CT MAXILLOFACIAL  WITHOUT CONTRAST CT CERVICAL SPINE WITHOUT CONTRAST TECHNIQUE: Multidetector CT imaging of the head, cervical spine, and maxillofacial structures were performed using the standard protocol without intravenous contrast. Multiplanar CT image reconstructions of the cervical spine and maxillofacial structures were also generated. Patient scanned with left side down, due to patient medical condition and tolerance. COMPARISON:  Head CT 12/31/2014 FINDINGS: CT HEAD FINDINGS Brain: Mild motion artifact through the frontal lobes. No evidence of acute infarction, hemorrhage, hydrocephalus, extra-axial collection or mass lesion/mass effect. Generalized atrophy and chronic small vessel ischemia, stable from prior exam. 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 Vascular: No hyperdense vessel or unexpected calcification. Skull: Right frontal scalp hematoma.  No calvarial fracture. Sinuses/Orbits: See dedicated face CT performed concurrently. CT MAXILLOFACIAL FINDINGS Osseous: Right orbital fracture, no additional facial bone fracture. Orbits: Minimally displaced fracture right inferior orbital floor. Wall thickening of the right inferior rectus muscle at the fracture site. Small fluid level in the right maxillary sinus. Globe is intact. There is right periorbital edema and laceration. No foreign body. Left orbit and globe are intact. Sinuses: Fluid level in the right maxillary sinus secondary to orbital fracture. Paranasal sinuses are otherwise clear. Soft tissues: Right periorbital soft tissue edema. Limited intracranial: See concurrent  head CT. CT CERVICAL SPINE FINDINGS Alignment: Straightening of normal lordosis. Minimal anterolisthesis of C3 on C4 and C4 on C5. No acute subluxation or malalignment. Skull base and vertebrae: No acute fracture. No primary bone lesion or focal pathologic process. Soft tissues and spinal canal: No prevertebral fluid or swelling. No visible canal hematoma. Disc levels: Diffuse disc degeneration, most prominent at C5-C6. Multilevel facet arthropathy. Bone island noted in the dens. Upper chest: Negative. IMPRESSION: 1. Right frontal scalp hematoma. No calvarial fracture or acute intracranial abnormality. Stable atrophy. 2. Minimally displaced right infraorbital floor fracture. Mild thickening of the adjacent inferior rectus muscle without definite entrapment. 3. 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 Electronically Signed   By: Jeb Levering M.D.   On: 01/14/2016 03:12    Assessment/Plan  Diabetes Mellitus Type 2  Will increase the Morning Insulin to 40 units Q Am Increase the evening to 35 units QPM. Continue Same dose of Metformin. Continue accu check. Reevaluate in week.  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  Family/ staff Communication:   Labs/tests ordered:

## 2016-02-08 ENCOUNTER — Encounter: Payer: Self-pay | Admitting: Internal Medicine

## 2016-02-08 ENCOUNTER — Non-Acute Institutional Stay (SKILLED_NURSING_FACILITY): Payer: Medicare Other | Admitting: Internal Medicine

## 2016-02-08 DIAGNOSIS — Z794 Long term (current) use of insulin: Secondary | ICD-10-CM | POA: Diagnosis not present

## 2016-02-08 DIAGNOSIS — I878 Other specified disorders of veins: Secondary | ICD-10-CM | POA: Diagnosis not present

## 2016-02-08 DIAGNOSIS — E1142 Type 2 diabetes mellitus with diabetic polyneuropathy: Secondary | ICD-10-CM

## 2016-02-08 DIAGNOSIS — I4891 Unspecified atrial fibrillation: Secondary | ICD-10-CM

## 2016-02-08 DIAGNOSIS — E1165 Type 2 diabetes mellitus with hyperglycemia: Secondary | ICD-10-CM | POA: Diagnosis not present

## 2016-02-08 DIAGNOSIS — F0391 Unspecified dementia with behavioral disturbance: Secondary | ICD-10-CM | POA: Diagnosis not present

## 2016-02-08 DIAGNOSIS — IMO0002 Reserved for concepts with insufficient information to code with codable children: Secondary | ICD-10-CM

## 2016-02-08 NOTE — Progress Notes (Signed)
Location:    [facility] Room Number: 113/W Place of Service:  SNF (31) Provider:  Jefm Bryant, MD  Patient Care Team: Celedonio Savage, MD as PCP - General (Family Medicine) Yehuda Savannah, MD (Cardiology) Lendon Colonel, NP as Nurse Practitioner (Nurse Practitioner)  Extended Emergency Contact Information Primary Emergency Contact: [facility] Address: [address]          [place], [place] [address] [name] of [place] Phone: [phone] Mobile Phone: [phone] Relation: Daughter  Code Status:  DNR Goals of care: Advanced Directive information Advanced Directives 02/08/2016  Does patient have an advance directive? Yes  Type of Advance Directive Out of facility DNR (pink MOST or yellow form)  Does patient want to make changes to advanced directive? No - Patient declined  Copy of advanced directive(s) in chart? Yes     Chief Complaint  Patient presents with  . Medical Management of Chronic Issues    Routine Visit    HPI:  Pt is a 75 y.o. female seen today for medical management of chronic diseases.  Patient has h/o Diabetes mellitus type 2 , atrial fibrillation on chronic anticoagulation, Venous stasis, Dementia with Behavior problems, S/P fall leading to Ocular floor fracture. Patient was initially admitted for non healing ulcers. They have healed now except small one on right leg. She Continues to run BS above 250 in the  Morning and evening. I have been going slowly up on her Insulin. Patient is on Xarrelto for her Chronic Atrial fibrillation. She also has appointment with retina specialist for diabetic retinopathy. Her HgbA1C was 8.6 in 09/17.   Past Medical History:  Diagnosis Date  . Allergy   . Atrial fibrillation ([place]) 08/2011   First diagnosed in 08/2011; duration of arrhythmia is uncertain  . Bilateral lower extremity edema   . Chronic diarrhea    diverticulosis  . COPD (chronic obstructive pulmonary disease)  ([place])   . Dementia   . Diabetes mellitus, type 2 ([facility])    Diabetic neuropathy  . Dyspnea on exertion    pedal edema  . Gout   . Headache(784.0)    twice weekly  . Hyperlipidemia   . Hypertension   . Osteopenia    DEXA scan 01/2010  . Palpitations   . Seasonal allergies   . Stress incontinence   . Vertigo    Past Surgical History:  Procedure Laterality Date  . APPENDECTOMY    . BREAST BIOPSY  2002   Right  . CESAREAN SECTION     x 2  . CHOLECYSTECTOMY    . COLONOSCOPY  2007   Negative screening study  . HAMMER TOE SURGERY     Bilateral hammer toe amputation  . INCISIONAL HERNIA REPAIR    . KNEE ARTHROSCOPY W/ MENISCAL REPAIR  2007   Bilateral  . UMBILICAL HERNIA REPAIR      Allergies  Allergen Reactions  . Codeine Anaphylaxis and Hives  . Morphine And Related Anaphylaxis and Hives  . Penicillins Anaphylaxis  . Ace Inhibitors Cough    Current Outpatient Prescriptions on File Prior to Visit  Medication Sig Dispense Refill  . acetaminophen (TYLENOL) 325 MG tablet Take 650 mg by mouth every 6 (six) hours as needed.    . Amino Acids-Protein Hydrolys (FEEDING SUPPLEMENT, PRO-STAT SUGAR FREE 64,) LIQD Take 30 mLs by mouth daily.    . carboxymethylcellulose (REFRESH PLUS) 0.5 % SOLN Place 1 drop into both eyes 4 (four) times daily.    Marland Kitchen  cetirizine (ZYRTEC) 10 MG tablet Take 1 tablet (10 mg total) by mouth daily. 90 tablet 3  . cholecalciferol (VITAMIN D) 1000 UNITS tablet Take 1,000 Units by mouth daily.    . citalopram (CELEXA) 20 MG tablet Take 1 tablet (20 mg total) by mouth daily. 90 tablet 3  . digoxin (LANOXIN) 0.125 MG tablet Take 1 tablet by mouth once a day (HOLD FOR AP UNDER 60)    . docusate sodium (COLACE) 100 MG capsule Take 200 mg by mouth daily as needed.     . furosemide (LASIX) 20 MG tablet Take 20 mg by mouth.    . Insulin Isophane & Regular Human (HUMULIN 70/30 KWIKPEN) (70-30) 100 UNIT/ML PEN Give 40 units subcutaneous in mornings. Give 35 units in  the evening.    . insulin NPH Human (NOVOLIN N) 100 UNIT/ML injection Give per sliding scale    . Insulin Pen Needle (EASY TOUCH PEN NEEDLES) 31G X 8 MM MISC Use twice a day    . LORazepam (ATIVAN) 0.5 MG tablet Take one tablet by mouth every 8 hours as needed for anxiety 90 tablet 0  . memantine (NAMENDA XR) 14 MG CP24 24 hr capsule Take 14 mg by mouth daily.    . metFORMIN (GLUCOPHAGE) 500 MG tablet Take 500 mg by mouth daily.    . metoprolol (LOPRESSOR) 100 MG tablet Take 1 tablet (100 mg total) by mouth 2 (two) times daily. 180 tablet 3  . Multiple Vitamin (MULTIVITAMIN WITH MINERALS) TABS Take 1 tablet by mouth every morning.    Marland Kitchen POTASSIUM CHLORIDE ER PO Potassium chloride extended release capsule, give 10 meq by mouth once day    . pravastatin (PRAVACHOL) 40 MG tablet Take 1 tablet (40 mg total) by mouth every evening. 90 tablet 3  . Probiotic Product (RISA-BID PROBIOTIC PO) Take 1 tablet by mouth twice a day    . promethazine (PHENERGAN) 12.5 MG tablet Take 12.5 mg by mouth every 8 (eight) hours as needed for nausea or vomiting.    . traZODone (DESYREL) 50 MG tablet Take 50 mg by mouth at bedtime. As needed for sleep.    Alveda Reasons 20 MG TABS tablet TAKE (1) TABLET BY MOUTH ONCE DAILY. 30 tablet 2  . [DISCONTINUED] K-DUR 20 MEQ tablet TAKE 2 TABLETS BY MOUTH ONCE DAILY. 60 tablet 5   No current facility-administered medications on file prior to visit.      Review of Systems  Unable to perform ROS: Dementia    Immunization History  Administered Date(s) Administered  . Influenza-Unspecified 02/04/2016   Pertinent  Health Maintenance Due  Topic Date Due  . INFLUENZA VACCINE  04/01/2016 (Originally 12/01/2015)  . FOOT EXAM  11/29/2016 (Originally 03/18/1951)  . OPHTHALMOLOGY EXAM  11/29/2016 (Originally 03/18/1951)  . DEXA SCAN  11/29/2016 (Originally 03/18/2005)  . PNA vac Low Risk Adult (1 of 2 - PCV13) 11/29/2016 (Originally 03/18/2005)  . COLONOSCOPY  11/29/2025 (Originally  03/18/1990)  . URINE MICROALBUMIN  02/24/2016  . HEMOGLOBIN A1C  07/14/2016   Fall Risk  11/06/2015 09/29/2015 08/31/2015 06/30/2015 02/24/2015  Falls in the past year? Yes No No No Yes  Number falls in past yr: 2 or more - - - 2 or more  Injury with Fall? No - - - No   Functional Status Survey:    Vitals:   02/08/16 1535  BP: 104/65  Pulse: 83  Resp: 18  Temp: 98 F (36.7 C)  TempSrc: Oral  SpO2: 93%  Weight: 189  lb (85.7 kg)  Height: 5\' 6"  (1.676 m)   Body mass index is 30.51 kg/m. Physical Exam  Constitutional: She appears well-developed and well-nourished.  HENT:  Head: Normocephalic.  Mouth/Throat: Oropharynx is clear and moist.  Cardiovascular: Normal rate and normal heart sounds.  An irregular rhythm present.  Pulmonary/Chest: Breath sounds normal. No respiratory distress. She has no wheezes. She has no rales.  Abdominal: Soft. Bowel sounds are normal. She exhibits no distension. There is no tenderness. There is no rebound and no guarding.  Musculoskeletal: She exhibits edema.  Neurological: She is alert.  Not oriented But follow commands. Equal strength in all extremities.  Skin:  The wound in the right leg is almost healed. Does have some bruising around it.    Labs reviewed:  Recent Labs  12/01/15 0734 12/15/15 0730 01/15/16 0710  NA 135 137 139  K 4.4 3.8 4.0  CL 108 105 105  CO2 23 28 27   GLUCOSE 196* 153* 196*  BUN 33* 15 20  CREATININE 1.02* 0.84 0.83  CALCIUM 8.7* 8.0* 9.1    Recent Labs  03/24/15 1649 06/30/15 1712 01/15/16 0710  AST 16 15 20   ALT 21 13 16   ALKPHOS 66 58 61  BILITOT 0.3 0.2 0.6  PROT 7.5 7.3 7.6  ALBUMIN 3.7 3.5 3.3*    Recent Labs  12/01/15 0734 01/15/16 0500  WBC 7.5 7.1  NEUTROABS 4.9 4.0  HGB 12.0 14.0  HCT 37.4 44.3  MCV 92.3 94.7  PLT 300 194   Lab Results  Component Value Date   TSH 2.936 09/09/2011   Lab Results  Component Value Date   HGBA1C 8.6 (H) 01/15/2016   Lab Results  Component Value  Date   CHOL 153 09/21/2011   HDL 47 09/21/2011   LDLCALC 76 09/21/2011   TRIG 148 09/21/2011   CHOLHDL 3.3 09/21/2011    Significant Diagnostic Results in last 30 days:  Dg Ribs Unilateral W/chest Left  Result Date: 01/13/2016 CLINICAL DATA:  Status post fall.  Lateral rib pain. EXAM: LEFT RIBS AND CHEST - 3+ VIEW COMPARISON:  Chest radiograph 02/05/2015 FINDINGS: Cardiac silhouette remains enlarged. No focal airspace consolidation or pulmonary edema. Left basilar atelectasis. No pneumothorax or sizable pleural effusion. No rib fracture is identified. IMPRESSION: No displaced left rib fracture. Electronically Signed   By: Ulyses Jarred M.D.   On: 01/13/2016 23:29   Ct Head Wo Contrast  Result Date: 01/14/2016 CLINICAL DATA:  Fall, initial encounter. Fall at nursing facility after losing her footing. Headache and neck pain. EXAM: CT HEAD WITHOUT CONTRAST CT MAXILLOFACIAL WITHOUT CONTRAST CT CERVICAL SPINE WITHOUT CONTRAST TECHNIQUE: Multidetector CT imaging of the head, cervical spine, and maxillofacial structures were performed using the standard protocol without intravenous contrast. Multiplanar CT image reconstructions of the cervical spine and maxillofacial structures were also generated. Patient scanned with left side down, due to patient medical condition and tolerance. COMPARISON:  Head CT 12/31/2014 FINDINGS: CT HEAD FINDINGS Brain: Mild motion artifact through the frontal lobes. No evidence of acute infarction, hemorrhage, hydrocephalus, extra-axial collection or mass lesion/mass effect. Generalized atrophy and chronic small vessel ischemia, stable from prior exam. 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 Vascular: No hyperdense vessel or unexpected calcification. Skull: Right frontal scalp hematoma.  No calvarial fracture. Sinuses/Orbits: See dedicated face CT performed concurrently. CT MAXILLOFACIAL FINDINGS Osseous: Right orbital fracture, no additional facial bone  fracture. Orbits: Minimally displaced fracture right inferior orbital floor. Wall thickening of the right inferior rectus  muscle at the fracture site. Small fluid level in the right maxillary sinus. Globe is intact. There is right periorbital edema and laceration. No foreign body. Left orbit and globe are intact. Sinuses: Fluid level in the right maxillary sinus secondary to orbital fracture. Paranasal sinuses are otherwise clear. Soft tissues: Right periorbital soft tissue edema. Limited intracranial: See concurrent head CT. CT CERVICAL SPINE FINDINGS Alignment: Straightening of normal lordosis. Minimal anterolisthesis of C3 on C4 and C4 on C5. No acute subluxation or malalignment. Skull base and vertebrae: No acute fracture. No primary bone lesion or focal pathologic process. Soft tissues and spinal canal: No prevertebral fluid or swelling. No visible canal hematoma. Disc levels: Diffuse disc degeneration, most prominent at C5-C6. Multilevel facet arthropathy. Bone island noted in the dens. Upper chest: Negative. IMPRESSION: 1. Right frontal scalp hematoma. No calvarial fracture or acute intracranial abnormality. Stable atrophy. 2. Minimally displaced right infraorbital floor fracture. Mild thickening of the adjacent inferior rectus muscle without definite entrapment. 3. 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 Electronically Signed   By: Jeb Levering M.D.   On: 01/14/2016 03:12   Ct Cervical Spine Wo Contrast  Result Date: 01/14/2016 CLINICAL DATA:  Fall, initial encounter. Fall at nursing facility after losing her footing. Headache and neck pain. EXAM: CT HEAD WITHOUT CONTRAST CT MAXILLOFACIAL WITHOUT CONTRAST CT CERVICAL SPINE WITHOUT CONTRAST TECHNIQUE: Multidetector CT imaging of the head, cervical spine, and maxillofacial structures were performed using the standard protocol without intravenous contrast. Multiplanar CT image reconstructions  of the cervical spine and maxillofacial structures were also generated. Patient scanned with left side down, due to patient medical condition and tolerance. COMPARISON:  Head CT 12/31/2014 FINDINGS: CT HEAD FINDINGS Brain: Mild motion artifact through the frontal lobes. No evidence of acute infarction, hemorrhage, hydrocephalus, extra-axial collection or mass lesion/mass effect. Generalized atrophy and chronic small vessel ischemia, stable from prior exam. 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 Vascular: No hyperdense vessel or unexpected calcification. Skull: Right frontal scalp hematoma.  No calvarial fracture. Sinuses/Orbits: See dedicated face CT performed concurrently. CT MAXILLOFACIAL FINDINGS Osseous: Right orbital fracture, no additional facial bone fracture. Orbits: Minimally displaced fracture right inferior orbital floor. Wall thickening of the right inferior rectus muscle at the fracture site. Small fluid level in the right maxillary sinus. Globe is intact. There is right periorbital edema and laceration. No foreign body. Left orbit and globe are intact. Sinuses: Fluid level in the right maxillary sinus secondary to orbital fracture. Paranasal sinuses are otherwise clear. Soft tissues: Right periorbital soft tissue edema. Limited intracranial: See concurrent head CT. CT CERVICAL SPINE FINDINGS Alignment: Straightening of normal lordosis. Minimal anterolisthesis of C3 on C4 and C4 on C5. No acute subluxation or malalignment. Skull base and vertebrae: No acute fracture. No primary bone lesion or focal pathologic process. Soft tissues and spinal canal: No prevertebral fluid or swelling. No visible canal hematoma. Disc levels: Diffuse disc degeneration, most prominent at C5-C6. Multilevel facet arthropathy. Bone island noted in the dens. Upper chest: Negative. IMPRESSION: 1. Right frontal scalp hematoma. No calvarial fracture or acute intracranial abnormality. Stable atrophy. 2.  Minimally displaced right infraorbital floor fracture. Mild thickening of the adjacent inferior rectus muscle without definite entrapment. 3. 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 Electronically Signed   By: Jeb Levering M.D.   On: 01/14/2016 03:12   Dg Hip Unilat W Or Wo Pelvis 2-3 Views Left  Result Date:  01/13/2016 CLINICAL DATA:  Fall from standing position.  Pain. EXAM: DG HIP (WITH OR WITHOUT PELVIS) 2-3V LEFT COMPARISON:  None. FINDINGS: The cortical margins of the bony pelvis and left hip are intact. No fracture. Pubic symphysis and sacroiliac joints are congruent. Both femoral heads are well-seated in the respective acetabula. Vascular calcifications are seen. IMPRESSION: Negative for acute abnormality. Electronically Signed   By: Jeb Levering M.D.   On: 01/13/2016 23:26   Ct Maxillofacial Wo Contrast  Result Date: 01/14/2016 CLINICAL DATA:  Fall, initial encounter. Fall at nursing facility after losing her footing. Headache and neck pain. EXAM: CT HEAD WITHOUT CONTRAST CT MAXILLOFACIAL WITHOUT CONTRAST CT CERVICAL SPINE WITHOUT CONTRAST TECHNIQUE: Multidetector CT imaging of the head, cervical spine, and maxillofacial structures were performed using the standard protocol without intravenous contrast. Multiplanar CT image reconstructions of the cervical spine and maxillofacial structures were also generated. Patient scanned with left side down, due to patient medical condition and tolerance. COMPARISON:  Head CT 12/31/2014 FINDINGS: CT HEAD FINDINGS Brain: Mild motion artifact through the frontal lobes. No evidence of acute infarction, hemorrhage, hydrocephalus, extra-axial collection or mass lesion/mass effect. Generalized atrophy and chronic small vessel ischemia, stable from prior exam. 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 Vascular: No hyperdense vessel or unexpected calcification. Skull: Right  frontal scalp hematoma.  No calvarial fracture. Sinuses/Orbits: See dedicated face CT performed concurrently. CT MAXILLOFACIAL FINDINGS Osseous: Right orbital fracture, no additional facial bone fracture. Orbits: Minimally displaced fracture right inferior orbital floor. Wall thickening of the right inferior rectus muscle at the fracture site. Small fluid level in the right maxillary sinus. Globe is intact. There is right periorbital edema and laceration. No foreign body. Left orbit and globe are intact. Sinuses: Fluid level in the right maxillary sinus secondary to orbital fracture. Paranasal sinuses are otherwise clear. Soft tissues: Right periorbital soft tissue edema. Limited intracranial: See concurrent head CT. CT CERVICAL SPINE FINDINGS Alignment: Straightening of normal lordosis. Minimal anterolisthesis of C3 on C4 and C4 on C5. No acute subluxation or malalignment. Skull base and vertebrae: No acute fracture. No primary bone lesion or focal pathologic process. Soft tissues and spinal canal: No prevertebral fluid or swelling. No visible canal hematoma. Disc levels: Diffuse disc degeneration, most prominent at C5-C6. Multilevel facet arthropathy. Bone island noted in the dens. Upper chest: Negative. IMPRESSION: 1. Right frontal scalp hematoma. No calvarial fracture or acute intracranial abnormality. Stable atrophy. 2. Minimally displaced right infraorbital floor fracture. Mild thickening of the adjacent inferior rectus muscle without definite entrapment. 3. 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 Electronically Signed   By: Jeb Levering M.D.   On: 01/14/2016 03:12    Assessment/Plan  Uncontrolled type 2 diabetes mellitus with diabetic polyneuropathy And Retinopathy Patients BS are still running high. Will increase the Insulin to 45 units in the morning and 40 units in the evening. Continue Accu checks. Patient is not on ACE inhibitor but  her BP is too low to start any new meds. She does have appointment with ophthalmologist. For her retinopathy.   Atrial fibrillation with rapid ventricular response   Her rate is controlled on Digoxin Last level was 0.7 in 08/17 She is also on lopressor and Xarelto.  Venous stasis Doing well Edema improved. Continue Lasix   Dementia with behavioral disturbance  Patient doing well on Namenda and PRN ativan. Patient continues to be Fall risk.    Family/ staff Communication:   Labs/tests ordered:

## 2016-02-11 ENCOUNTER — Encounter (INDEPENDENT_AMBULATORY_CARE_PROVIDER_SITE_OTHER): Payer: Medicare Other | Admitting: Ophthalmology

## 2016-02-17 ENCOUNTER — Encounter (INDEPENDENT_AMBULATORY_CARE_PROVIDER_SITE_OTHER): Payer: Medicare Other | Admitting: Ophthalmology

## 2016-02-17 DIAGNOSIS — E11311 Type 2 diabetes mellitus with unspecified diabetic retinopathy with macular edema: Secondary | ICD-10-CM

## 2016-02-17 DIAGNOSIS — H34833 Tributary (branch) retinal vein occlusion, bilateral, with macular edema: Secondary | ICD-10-CM

## 2016-02-17 DIAGNOSIS — H43813 Vitreous degeneration, bilateral: Secondary | ICD-10-CM | POA: Diagnosis not present

## 2016-02-17 DIAGNOSIS — E113313 Type 2 diabetes mellitus with moderate nonproliferative diabetic retinopathy with macular edema, bilateral: Secondary | ICD-10-CM | POA: Diagnosis not present

## 2016-02-18 ENCOUNTER — Non-Acute Institutional Stay (SKILLED_NURSING_FACILITY): Payer: Medicare Other | Admitting: Internal Medicine

## 2016-02-18 ENCOUNTER — Encounter: Payer: Self-pay | Admitting: Internal Medicine

## 2016-02-18 DIAGNOSIS — E1165 Type 2 diabetes mellitus with hyperglycemia: Secondary | ICD-10-CM | POA: Diagnosis not present

## 2016-02-18 DIAGNOSIS — Z794 Long term (current) use of insulin: Secondary | ICD-10-CM

## 2016-02-18 DIAGNOSIS — IMO0002 Reserved for concepts with insufficient information to code with codable children: Secondary | ICD-10-CM

## 2016-02-18 DIAGNOSIS — I10 Essential (primary) hypertension: Secondary | ICD-10-CM

## 2016-02-18 DIAGNOSIS — E1142 Type 2 diabetes mellitus with diabetic polyneuropathy: Secondary | ICD-10-CM

## 2016-02-18 NOTE — Progress Notes (Signed)
Location:   [facility] Room Number: 113/W Place of Service:  SNF [phone]) Provider:  Dionicia Abler, MD  Patient Care Team: Celedonio Savage, MD as PCP - General (Family Medicine) Yehuda Savannah, MD (Cardiology) Lendon Colonel, NP as Nurse Practitioner (Nurse Practitioner)  Extended Emergency Contact Information Primary Emergency Contact: [facility] Address: [address]          [place], [place] [address] [name] of [place] Phone: [phone] Mobile Phone: [phone] Relation: Daughter  Code Status:  DNR Goals of care: Advanced Directive information Advanced Directives 02/18/2016  Does patient have an advance directive? Yes  Type of Advance Directive Out of facility DNR (pink MOST or yellow form)  Does patient want to make changes to advanced directive? No - Patient declined  Copy of advanced directive(s) in chart? Yes     Chief Complaint  Patient presents with  . Acute Visit    DM, Eye Issues    HPI:  Pt is a 75 y.o. female seen today for an acute visit forFollow-up of diabetes as well as review of recent visit to ophthalmology.  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  She appears to be stable in this regards.  Her blood sugar has been somewhat of a challenging issue-she is currently on Humulin 70/30 and this has gradually been titrated up at one point was decreased secondary to concerns of hypoglycemia but she has had elevated blood sugars and this has been titrated back up most recently her 7030 was increased to 45 units in the morning and 40 units at night by Dr.Gupta who has been following her.  She is also on Glucophage 500 mg every morning.  I did discuss her ophthalmology visit  with her daughter via phone quite extensively today-her daughter is concerned about the higher blood sugars and possible adjustments-it appears since the most recent adjustments blood sugars in the morning have ranged from 235 down to 143 with some variability.  At noon blood sugars range from 129 updated to 84.  4 PM blood sugars range from 148 up to 277.  And at night recent blood sugars appear to range from 91 up to 391 again some variability.  But generally appears to be more in the higher 100s to lower 200 range.  Her hemoglobin A1c recently was 8.6 on lab done last month-it appears approximately a year ago her hemoglobin A1c was 14 but this did improve significantly apparently when she moved in with her daughter and hemoglobin A1c stabilized in the low 7 range.  Per discussion with daughter the goal hemoglobin A1c is 6.5.  Also strict blood pressure control and this does appear to be stable with recent blood pressures 104/65-105/67-100/52-she is on Lopressor 100 mg twice a day as well as Lasix.  Currently she is ambulating about in her wheelchair appears to be in her usual good spirits although somewhat confused she does complain at times of some left eye blurriness.     Past Medical History:  Diagnosis Date  . Allergy   . Atrial fibrillation ([place]) 08/2011   First diagnosed in 08/2011; duration of arrhythmia is uncertain  . Bilateral lower extremity edema   . Chronic diarrhea    diverticulosis  . COPD (chronic obstructive pulmonary  disease) ([place])   . Dementia   . Diabetes mellitus, type 2 (HCC)    Diabetic neuropathy  . Dyspnea on exertion    pedal edema  . Gout   . Headache(784.0)    twice weekly  . Hyperlipidemia   . Hypertension   . Osteopenia    DEXA scan 01/2010  . Palpitations   . Seasonal allergies   . Stress incontinence   . Vertigo    Past Surgical History:  Procedure Laterality Date  . APPENDECTOMY    . BREAST BIOPSY  2002   Right  . CESAREAN SECTION      x 2  . CHOLECYSTECTOMY    . COLONOSCOPY  2007   Negative screening study  . HAMMER TOE SURGERY     Bilateral hammer toe amputation  . INCISIONAL HERNIA REPAIR    . KNEE ARTHROSCOPY W/ MENISCAL REPAIR  2007   Bilateral  . UMBILICAL HERNIA REPAIR      Allergies  Allergen Reactions  . Codeine Anaphylaxis and Hives  . Morphine And Related Anaphylaxis and Hives  . Penicillins Anaphylaxis  . Ace Inhibitors Cough    Current Outpatient Prescriptions on File Prior to Visit  Medication Sig Dispense Refill  . acetaminophen (TYLENOL) 325 MG tablet Take 650 mg by mouth every 6 (six) hours as needed.    . Amino Acids-Protein Hydrolys (FEEDING SUPPLEMENT, PRO-STAT SUGAR FREE 64,) LIQD Take 30 mLs by mouth daily.    . carboxymethylcellulose (REFRESH PLUS) 0.5 % SOLN Place 1 drop into both eyes 4 (four) times daily.    . cetirizine (ZYRTEC) 10 MG tablet Take 1 tablet (10 mg total) by mouth daily. 90 tablet 3  . cholecalciferol (VITAMIN D) 1000 UNITS tablet Take 1,000 Units by mouth daily.    . citalopram (CELEXA) 20 MG tablet Take 1 tablet (20 mg total) by mouth daily. 90 tablet 3  . digoxin (LANOXIN) 0.125 MG tablet Take 1 tablet by mouth once a day (HOLD FOR AP UNDER 60)    . docusate sodium (COLACE) 100 MG capsule Take 200 mg by mouth daily as needed.     . furosemide (LASIX) 20 MG tablet Take 20 mg by mouth.    . Insulin Isophane & Regular Human (HUMULIN 70/30 KWIKPEN) (70-30) 100 UNIT/ML PEN Give 45 units subcutaneous in mornings. Give 40 units in the evening.    . insulin NPH Human (NOVOLIN N) 100 UNIT/ML injection Give per sliding scale    . Insulin Pen Needle (EASY TOUCH PEN NEEDLES) 31G X 8 MM MISC Use twice a day    . LORazepam (ATIVAN) 0.5 MG tablet Take one tablet by mouth every 8 hours as needed for anxiety 90 tablet 0  . memantine (NAMENDA XR) 14 MG CP24 24 hr capsule Take 14 mg by mouth daily.    . metFORMIN (GLUCOPHAGE) 500 MG tablet Take 500 mg by mouth daily.    . metoprolol  (LOPRESSOR) 100 MG tablet Take 1 tablet (100 mg total) by mouth 2 (two) times daily. 180 tablet 3  . Multiple Vitamin (MULTIVITAMIN WITH MINERALS) TABS Take 1 tablet by mouth every morning.    Marland Kitchen POTASSIUM CHLORIDE ER PO Potassium chloride extended release capsule, give 10 meq by mouth once day    . pravastatin (PRAVACHOL) 40 MG tablet Take 1 tablet (40 mg total) by mouth every evening. 90 tablet 3  . Probiotic Product (RISA-BID PROBIOTIC PO) Take 1 tablet by mouth twice a day    . promethazine (PHENERGAN)  12.5 MG tablet Take 12.5 mg by mouth every 8 (eight) hours as needed for nausea or vomiting.    . traZODone (DESYREL) 50 MG tablet Take 50 mg by mouth at bedtime. As needed for sleep.    Alveda Reasons 20 MG TABS tablet TAKE (1) TABLET BY MOUTH ONCE DAILY. 30 tablet 2  . [DISCONTINUED] K-DUR 20 MEQ tablet TAKE 2 TABLETS BY MOUTH ONCE DAILY. 60 tablet 5   No current facility-administered medications on file prior to visit.     Review of Systems   This is limited secondary to dementia-she does not have any acute complaints    Immunization History  Administered Date(s) Administered  . Influenza-Unspecified 02/04/2016  . Pneumococcal-Unspecified 02/08/2016   Pertinent  Health Maintenance Due  Topic Date Due  . FOOT EXAM  11/29/2016 (Originally 03/18/1951)  . OPHTHALMOLOGY EXAM  11/29/2016 (Originally 03/18/1951)  . DEXA SCAN  11/29/2016 (Originally 03/18/2005)  . COLONOSCOPY  11/29/2025 (Originally 03/18/1990)  . URINE MICROALBUMIN  02/24/2016  . HEMOGLOBIN A1C  07/14/2016  . PNA vac Low Risk Adult (2 of 2 - PCV13) 02/07/2017  . INFLUENZA VACCINE  Completed   Fall Risk  11/06/2015 09/29/2015 08/31/2015 06/30/2015 02/24/2015  Falls in the past year? Yes No No No Yes  Number falls in past yr: 2 or more - - - 2 or more  Injury with Fall? No - - - No   Functional Status Survey:    Vitals:   02/18/16 1339  BP: (!) 146/72  Pulse: 70  Resp: 18  Temp: 98.1 F (36.7 C)  TempSrc: Oral    SpO2: 93%   There is no height or weight on file to calculate BMI. Physical Exam  In general this is a pleasant elderly female in no distress sitting comfortably in her wheelchair.  Her skin is warm and dry.  Eyes she does have prescription lenses visual acuity appears grossly intact she can tell me the number of fingers I am holding up however she states fingers appear blurred when she uses her left eye.  Oropharynx clear mucous membranes moist.  Heart is regular rate and rhythm with occasional irregular beats.  Chest is clear to auscultation there is no labored breathing.  Abdomen is soft nontender with positive bowel sounds.  Muscle skeletal does move all extremities 4 ambulates in a wheelchair she has wrapping of her left lower leg.  Neurologic is grossly intact she is alert her speech is clear no lateralizing findings.  Psych she is oriented to self pleasant and follows commands but is somewhat confused     Labs reviewed:  Recent Labs  12/01/15 0734 12/15/15 0730 01/15/16 0710  NA 135 137 139  K 4.4 3.8 4.0  CL 108 105 105  CO2 23 28 27   GLUCOSE 196* 153* 196*  BUN 33* 15 20  CREATININE 1.02* 0.84 0.83  CALCIUM 8.7* 8.0* 9.1    Recent Labs  03/24/15 1649 06/30/15 1712 01/15/16 0710  AST 16 15 20   ALT 21 13 16   ALKPHOS 66 58 61  BILITOT 0.3 0.2 0.6  PROT 7.5 7.3 7.6  ALBUMIN 3.7 3.5 3.3*    Recent Labs  12/01/15 0734 01/15/16 0500  WBC 7.5 7.1  NEUTROABS 4.9 4.0  HGB 12.0 14.0  HCT 37.4 44.3  MCV 92.3 94.7  PLT 300 194   Lab Results  Component Value Date   TSH 2.936 09/09/2011   Lab Results  Component Value Date   HGBA1C 8.6 (H) 01/15/2016  Lab Results  Component Value Date   CHOL 153 09/21/2011   HDL 47 09/21/2011   LDLCALC 76 09/21/2011   TRIG 148 09/21/2011   CHOLHDL 3.3 09/21/2011    Significant Diagnostic Results in last 30 days:  No results found.  Assessment/Plan #1 history of diabetes-again blood sugars still  continue be somewhat elevated although appear to be gradually improving-this was discussed with Dr. Lyndel Safe at this point will continue the Humulin 70/30 45 units in the morning and 40 units in the evening-also has sliding scale Novolin R.  Will increase the Glucophage to 500 mg twice a day she had been on this previously per her daughter and done well with this-at this point continue to monitor blood sugars closely for any possible adjustments. We are trying to avoid hypoglycemia as well  Her daughter also states she always sees some high glycemic foods on  tray at times including white bread-will order a dietary consult to see if further adjustments can be made in this regards Also will have to monitor any snacking.      Again hemoglobin A1c goal is 6.5.  This plan was discussed with Dr. Lyndel Safe  In regards to blood pressure control this appears to be stable as noted above She is on Lopressor as well as Lasix.  I note she does have a history of atrial fibrillation as well and is on the Lopressor for this as well Continues on Xarelto for anticoagulation  CPT 99310-of note greater the 35 minutes spent assessing patient--reviewing her chart-labs-discussing her status with her daughter via phone--and coordinating and formulating  A plan of care--of note greater than 50 percent of time spent coordinating plan of care     I.      Oralia Manis, [place]

## 2016-03-03 ENCOUNTER — Non-Acute Institutional Stay (SKILLED_NURSING_FACILITY): Payer: Medicare Other | Admitting: Internal Medicine

## 2016-03-03 ENCOUNTER — Encounter: Payer: Self-pay | Admitting: Internal Medicine

## 2016-03-03 DIAGNOSIS — F0391 Unspecified dementia with behavioral disturbance: Secondary | ICD-10-CM | POA: Diagnosis not present

## 2016-03-03 DIAGNOSIS — Z794 Long term (current) use of insulin: Secondary | ICD-10-CM

## 2016-03-03 DIAGNOSIS — E1142 Type 2 diabetes mellitus with diabetic polyneuropathy: Secondary | ICD-10-CM

## 2016-03-03 DIAGNOSIS — IMO0002 Reserved for concepts with insufficient information to code with codable children: Secondary | ICD-10-CM

## 2016-03-03 DIAGNOSIS — E1165 Type 2 diabetes mellitus with hyperglycemia: Secondary | ICD-10-CM

## 2016-03-03 NOTE — Progress Notes (Signed)
Location:   [facility] Room Number: 113/W Place of Service:  SNF (31) Provider:  Jefm Bryant, MD  Patient Care Team: Celedonio Savage, MD as PCP - General (Family Medicine) Yehuda Savannah, MD (Cardiology) Lendon Colonel, NP as Nurse Practitioner (Nurse Practitioner)  Extended Emergency Contact Information Primary Emergency Contact: [facility] Address: [address]          [place], [place] [address] [name] of [place] Phone: [phone] Mobile Phone: [phone] Relation: Daughter  Code Status:  DNR Goals of care: Advanced Directive information Advanced Directives 03/03/2016  Does patient have an advance directive? Yes  Type of Advance Directive Out of facility DNR (pink MOST or yellow form)  Does patient want to make changes to advanced directive? No - Patient declined  Copy of advanced directive(s) in chart? Yes     Chief Complaint  Patient presents with  . Acute Visit    Blood sugar    HPI:  Pt is a 75 y.o. female seen today for an acute visit for follow up on her Blood sugars. Patient has h/o Diabetes mellitus with uncontrolled Blood sugars. She also has history of  atrial fibrillation on chronic anticoagulation, Venous stasis, Dementia with Behavior problems, S/P fall leading to Ocular floor fracture.  Patient BS are running better now with most of her readings in the morning are less then 200. Her afternoon sugars are b/w 200 and 250. Night BS are less then 200 again. Her Last A1C was 8.6 in 09/17 Patient is alert and in good mood. She says she doing excellent. Past Medical History:  Diagnosis Date  . Allergy   . Atrial fibrillation ([facility]) 08/2011   First diagnosed in 08/2011; duration of arrhythmia is uncertain  . Bilateral lower extremity edema   . Chronic diarrhea    diverticulosis  . COPD (chronic obstructive pulmonary disease) ([place])   . Dementia   . Diabetes mellitus, type 2 (HCC)    Diabetic neuropathy   . Dyspnea on exertion    pedal edema  . Gout   . Headache(784.0)    twice weekly  . Hyperlipidemia   . Hypertension   . Osteopenia    DEXA scan 01/2010  . Palpitations   . Seasonal allergies   . Stress incontinence   . Vertigo    Past Surgical History:  Procedure Laterality Date  . APPENDECTOMY    . BREAST BIOPSY  2002   Right  . CESAREAN SECTION     x 2  . CHOLECYSTECTOMY    . COLONOSCOPY  2007   Negative screening study  . HAMMER TOE SURGERY     Bilateral hammer toe amputation  . INCISIONAL HERNIA REPAIR    . KNEE ARTHROSCOPY W/ MENISCAL REPAIR  2007   Bilateral  . UMBILICAL HERNIA REPAIR      Allergies  Allergen Reactions  . Codeine Anaphylaxis and Hives  . Morphine And Related Anaphylaxis and Hives  . Penicillins Anaphylaxis  . Ace Inhibitors Cough    Current Outpatient Prescriptions on File Prior to Visit  Medication Sig Dispense Refill  . acetaminophen (TYLENOL) 325 MG tablet Take 650 mg by mouth every 6 (six) hours as needed.    . Amino Acids-Protein Hydrolys (FEEDING SUPPLEMENT, PRO-STAT SUGAR FREE 64,) LIQD Take 30 mLs by mouth daily.    . carboxymethylcellulose (REFRESH PLUS) 0.5 % SOLN Place 1 drop into both eyes 4 (four) times daily.    . cetirizine (ZYRTEC) 10 MG tablet  Take 1 tablet (10 mg total) by mouth daily. 90 tablet 3  . cholecalciferol (VITAMIN D) 1000 UNITS tablet Take 1,000 Units by mouth daily.    . citalopram (CELEXA) 20 MG tablet Take 1 tablet (20 mg total) by mouth daily. 90 tablet 3  . digoxin (LANOXIN) 0.125 MG tablet Take 1 tablet by mouth once a day (HOLD FOR AP UNDER 60)    . docusate sodium (COLACE) 100 MG capsule Take 200 mg by mouth daily as needed.     . furosemide (LASIX) 20 MG tablet Take 20 mg by mouth.    . Insulin Isophane & Regular Human (HUMULIN 70/30 KWIKPEN) (70-30) 100 UNIT/ML PEN Give 45 units subcutaneous in mornings. Give 40 units in the evening.    . insulin NPH Human (NOVOLIN N) 100 UNIT/ML injection Give per  sliding scale Four times a day    . Insulin Pen Needle (EASY TOUCH PEN NEEDLES) 31G X 8 MM MISC Use twice a day    . LORazepam (ATIVAN) 0.5 MG tablet Take one tablet by mouth every 8 hours as needed for anxiety 90 tablet 0  . memantine (NAMENDA XR) 14 MG CP24 24 hr capsule Take 14 mg by mouth daily.    . metFORMIN (GLUCOPHAGE) 500 MG tablet Take 500 mg by mouth 2 (two) times daily with a meal.     . metoprolol (LOPRESSOR) 100 MG tablet Take 1 tablet (100 mg total) by mouth 2 (two) times daily. 180 tablet 3  . Multiple Vitamin (MULTIVITAMIN WITH MINERALS) TABS Take 1 tablet by mouth every morning.    Marland Kitchen POTASSIUM CHLORIDE ER PO Potassium chloride extended release capsule, give 10 meq by mouth once day    . pravastatin (PRAVACHOL) 40 MG tablet Take 1 tablet (40 mg total) by mouth every evening. 90 tablet 3  . Probiotic Product (RISA-BID PROBIOTIC PO) Take 1 tablet by mouth twice a day    . traZODone (DESYREL) 50 MG tablet Take 50 mg by mouth at bedtime. As needed for sleep.    Alveda Reasons 20 MG TABS tablet TAKE (1) TABLET BY MOUTH ONCE DAILY. 30 tablet 2  . [DISCONTINUED] K-DUR 20 MEQ tablet TAKE 2 TABLETS BY MOUTH ONCE DAILY. 60 tablet 5   No current facility-administered medications on file prior to visit.     Review of Systems  Unable to perform ROS: Dementia    Immunization History  Administered Date(s) Administered  . Influenza-Unspecified 02/04/2016  . Pneumococcal-Unspecified 02/08/2016   Pertinent  Health Maintenance Due  Topic Date Due  . URINE MICROALBUMIN  02/24/2016  . FOOT EXAM  11/29/2016 (Originally 03/18/1951)  . OPHTHALMOLOGY EXAM  11/29/2016 (Originally 03/18/1951)  . DEXA SCAN  11/29/2016 (Originally 03/18/2005)  . COLONOSCOPY  11/29/2025 (Originally 03/18/1990)  . HEMOGLOBIN A1C  07/14/2016  . PNA vac Low Risk Adult (2 of 2 - PCV13) 02/07/2017  . INFLUENZA VACCINE  Completed   Fall Risk  11/06/2015 09/29/2015 08/31/2015 06/30/2015 02/24/2015  Falls in the past year? Yes  No No No Yes  Number falls in past yr: 2 or more - - - 2 or more  Injury with Fall? No - - - No   Functional Status Survey:    There were no vitals filed for this visit. There is no height or weight on file to calculate BMI. Physical Exam  Constitutional: She appears well-developed and well-nourished.  HENT:  Head: Normocephalic.  Mouth/Throat: Oropharynx is clear and moist.  Cardiovascular: Normal rate and regular rhythm.  Murmur heard. Pulmonary/Chest: Breath sounds normal. No respiratory distress. She has no wheezes. She has no rales.  Abdominal: Soft. Bowel sounds are normal. She exhibits no distension. There is tenderness. There is rebound.  Musculoskeletal: She exhibits edema.  Neurological: She is alert.    Labs reviewed:  Recent Labs  12/01/15 0734 12/15/15 0730 01/15/16 0710  NA 135 137 139  K 4.4 3.8 4.0  CL 108 105 105  CO2 23 28 27   GLUCOSE 196* 153* 196*  BUN 33* 15 20  CREATININE 1.02* 0.84 0.83  CALCIUM 8.7* 8.0* 9.1    Recent Labs  03/24/15 1649 06/30/15 1712 01/15/16 0710  AST 16 15 20   ALT 21 13 16   ALKPHOS 66 58 61  BILITOT 0.3 0.2 0.6  PROT 7.5 7.3 7.6  ALBUMIN 3.7 3.5 3.3*    Recent Labs  12/01/15 0734 01/15/16 0500  WBC 7.5 7.1  NEUTROABS 4.9 4.0  HGB 12.0 14.0  HCT 37.4 44.3  MCV 92.3 94.7  PLT 300 194   Lab Results  Component Value Date   TSH 2.936 09/09/2011   Lab Results  Component Value Date   HGBA1C 8.6 (H) 01/15/2016   Lab Results  Component Value Date   CHOL 153 09/21/2011   HDL 47 09/21/2011   LDLCALC 76 09/21/2011   TRIG 148 09/21/2011   CHOLHDL 3.3 09/21/2011    Significant Diagnostic Results in last 30 days:  No results found.  Assessment/Plan  Diabetes Mellitus Patient has diabetic retinopathy and needs better control of her Bs. She is doing better her Metformin was increased to 500 mg twice a day. Will increase her Morning Insulins to 48 units Q AM. Will leave the evening insulin same. Keep  Accu check..  Dementia Patient doing well. Still stay increased fall risk. On Namenda and prn ativan.  Family/ staff Communication:   Labs/tests ordered:

## 2016-03-11 ENCOUNTER — Non-Acute Institutional Stay (SKILLED_NURSING_FACILITY): Payer: Medicare Other | Admitting: Internal Medicine

## 2016-03-11 ENCOUNTER — Encounter: Payer: Self-pay | Admitting: Internal Medicine

## 2016-03-11 DIAGNOSIS — F0391 Unspecified dementia with behavioral disturbance: Secondary | ICD-10-CM | POA: Diagnosis not present

## 2016-03-11 DIAGNOSIS — Z794 Long term (current) use of insulin: Secondary | ICD-10-CM | POA: Diagnosis not present

## 2016-03-11 DIAGNOSIS — I878 Other specified disorders of veins: Secondary | ICD-10-CM

## 2016-03-11 DIAGNOSIS — E1165 Type 2 diabetes mellitus with hyperglycemia: Secondary | ICD-10-CM | POA: Diagnosis not present

## 2016-03-11 DIAGNOSIS — IMO0002 Reserved for concepts with insufficient information to code with codable children: Secondary | ICD-10-CM

## 2016-03-11 DIAGNOSIS — E1142 Type 2 diabetes mellitus with diabetic polyneuropathy: Secondary | ICD-10-CM | POA: Diagnosis not present

## 2016-03-11 DIAGNOSIS — I4891 Unspecified atrial fibrillation: Secondary | ICD-10-CM

## 2016-03-11 DIAGNOSIS — E785 Hyperlipidemia, unspecified: Secondary | ICD-10-CM

## 2016-03-11 NOTE — Progress Notes (Signed)
Location:   [facility] Room Number: 113/W Place of Service:  SNF (31) Provider:  Dionicia Abler, MD  Patient Care Team: Celedonio Savage, MD as PCP - General (Family Medicine) Yehuda Savannah, MD (Cardiology) Lendon Colonel, NP as Nurse Practitioner (Nurse Practitioner)  Extended Emergency Contact Information Primary Emergency Contact: [facility] Address: [address]          [place], [place] [address] [name] of [place] Phone: [phone] Mobile Phone: [phone] Relation: Daughter  Code Status:  DNR Goals of care: Advanced Directive information Advanced Directives 03/11/2016  Does patient have an advance directive? Yes  Type of Advance Directive Out of facility DNR (pink MOST or yellow form)  Does patient want to make changes to advanced directive? No - Patient declined  Copy of advanced directive(s) in chart? Yes     Chief Complaint  Patient presents with  . Medical Management of Chronic Issues    Routine Visit   Medical management of chronic medical issues including diabetes type 2-atrial fibrillation-venous stasis-dementia-allergic rhinitis  HPI:  Pt is a 75 y.o. female seen today for medical management of chronic diseases. As noted above.  She appears to be doing quite well with supportive care initially when she came in her there was concern about dementia with behaviors but this has really stabilized she is on Namenda as well as Ativan as needed for agitation.  Her diabetes has been somewhat of a challenge to manage with at times concerns with lower blood sugars however hemoglobin A1c showed elevation at 8.6 on lab done in September and adjustments have been made she is now on Humulin 70/30 48 units in the morning as well as 45 units at night-this was recently adjusted again by Dr.Gupta she is also on Glucophage 500 mg twice a day-recent morning blood sugars appear to be more in the mid 100s 119-190-later in the day  there is some area ability ranging from 129 06/04/1932 over the last few days--at 4:30 blood sugars appear to be in the mid higher 100s and at at bedtime actually appear to be from 90 to the low 100s recently.  She does have a history of venous stasis changes and edema she has compression hose on her weight appears to be stable 191.8 she is on Lasix low dose with potassium supplementation.  She was a came here with venous stasis wounds of her legs but these have healed which is encouraging again she appears to be doing quite well.  In regards to diabetic retinopathy she is having close follow-up. Has an appointment scheduled with ophthalmology on November 20 again they have stressed stresseddiabetic control is much as possible which is certainly understandable    Earlier in her stay she did sustain a fall with an orbital fracture but she appears to have made an unremarkable recovery from this  Currently she has no complaints nursing staff does not really report any concerns she is resting in bed comfortably is bright alert confused but very cooperative   Past Medical History:  Diagnosis Date  . Allergy   . Atrial fibrillation ([place]) 08/2011   First diagnosed in 08/2011; duration of arrhythmia is uncertain  . Bilateral lower extremity edema   . Chronic diarrhea    diverticulosis  . COPD (chronic obstructive pulmonary disease) ([facility])   . Dementia   . Diabetes mellitus, type 2 (HCC)    Diabetic neuropathy  . Dyspnea on exertion    pedal edema  .  Gout   . Headache(784.0)    twice weekly  . Hyperlipidemia   . Hypertension   . Osteopenia    DEXA scan 01/2010  . Palpitations   . Seasonal allergies   . Stress incontinence   . Vertigo    Past Surgical History:  Procedure Laterality Date  . APPENDECTOMY    . BREAST BIOPSY  2002   Right  . CESAREAN SECTION     x 2  . CHOLECYSTECTOMY    . COLONOSCOPY  2007   Negative screening study  . HAMMER TOE SURGERY     Bilateral hammer toe  amputation  . INCISIONAL HERNIA REPAIR    . KNEE ARTHROSCOPY W/ MENISCAL REPAIR  2007   Bilateral  . UMBILICAL HERNIA REPAIR      Allergies  Allergen Reactions  . Codeine Anaphylaxis and Hives  . Morphine And Related Anaphylaxis and Hives  . Penicillins Anaphylaxis  . Ace Inhibitors Cough    Current Outpatient Prescriptions on File Prior to Visit  Medication Sig Dispense Refill  . acetaminophen (TYLENOL) 325 MG tablet Take 650 mg by mouth every 6 (six) hours as needed.    . Amino Acids-Protein Hydrolys (FEEDING SUPPLEMENT, PRO-STAT SUGAR FREE 64,) LIQD Take 30 mLs by mouth daily.    . carboxymethylcellulose (REFRESH PLUS) 0.5 % SOLN Place 1 drop into both eyes 4 (four) times daily.    . cetirizine (ZYRTEC) 10 MG tablet Take 1 tablet (10 mg total) by mouth daily. 90 tablet 3  . cholecalciferol (VITAMIN D) 1000 UNITS tablet Take 1,000 Units by mouth daily.    . citalopram (CELEXA) 20 MG tablet Take 1 tablet (20 mg total) by mouth daily. 90 tablet 3  . digoxin (LANOXIN) 0.125 MG tablet Take 1 tablet by mouth once a day (HOLD FOR AP UNDER 60)    . docusate sodium (COLACE) 100 MG capsule Take 200 mg by mouth daily as needed.     . furosemide (LASIX) 20 MG tablet Take 20 mg by mouth.    . Insulin Isophane & Regular Human (HUMULIN 70/30 KWIKPEN) (70-30) 100 UNIT/ML PEN Give 48 units subcutaneous in mornings. Give 40 units in the evening.    . insulin NPH Human (NOVOLIN N) 100 UNIT/ML injection Give per sliding scale Four times a day    . Insulin Pen Needle (EASY TOUCH PEN NEEDLES) 31G X 8 MM MISC Use twice a day    . LORazepam (ATIVAN) 0.5 MG tablet Take one tablet by mouth every 8 hours as needed for anxiety 90 tablet 0  . Magnesium Hydroxide (MILK OF MAGNESIA PO) Give 30 ml by mouth for as needed for constipation , may try fleets enema    . memantine (NAMENDA XR) 14 MG CP24 24 hr capsule Take 14 mg by mouth daily.    . metFORMIN (GLUCOPHAGE) 500 MG tablet Take 500 mg by mouth 2 (two) times  daily with a meal.     . metoprolol (LOPRESSOR) 100 MG tablet Take 1 tablet (100 mg total) by mouth 2 (two) times daily. 180 tablet 3  . Multiple Vitamin (MULTIVITAMIN WITH MINERALS) TABS Take 1 tablet by mouth every morning.    Marland Kitchen POTASSIUM CHLORIDE ER PO Potassium chloride extended release capsule, give 10 meq by mouth once day    . pravastatin (PRAVACHOL) 40 MG tablet Take 1 tablet (40 mg total) by mouth every evening. 90 tablet 3  . Probiotic Product (RISA-BID PROBIOTIC PO) Take 1 tablet by mouth twice a day    .  traZODone (DESYREL) 50 MG tablet Take 50 mg by mouth at bedtime. As needed for sleep.    Alveda Reasons 20 MG TABS tablet TAKE (1) TABLET BY MOUTH ONCE DAILY. 30 tablet 2  . [DISCONTINUED] K-DUR 20 MEQ tablet TAKE 2 TABLETS BY MOUTH ONCE DAILY. 60 tablet 5   No current facility-administered medications on file prior to visit.      Review of Systems   This is limited secondary to dementia but she is not complaining of any shortness of breath cough dizziness headache appears to be doing quite well with supportive care  Immunization History  Administered Date(s) Administered  . Influenza-Unspecified 02/04/2016  . Pneumococcal-Unspecified 02/08/2016   Pertinent  Health Maintenance Due  Topic Date Due  . URINE MICROALBUMIN  02/24/2016  . FOOT EXAM  11/29/2016 (Originally 03/18/1951)  . OPHTHALMOLOGY EXAM  11/29/2016 (Originally 03/18/1951)  . DEXA SCAN  11/29/2016 (Originally 03/18/2005)  . COLONOSCOPY  11/29/2025 (Originally 03/18/1990)  . HEMOGLOBIN A1C  07/14/2016  . PNA vac Low Risk Adult (2 of 2 - PCV13) 02/07/2017  . INFLUENZA VACCINE  Completed   Fall Risk  11/06/2015 09/29/2015 08/31/2015 06/30/2015 02/24/2015  Falls in the past year? Yes No No No Yes  Number falls in past yr: 2 or more - - - 2 or more  Injury with Fall? No - - - No   Functional Status Survey:    Vitals:   03/11/16 1037  BP: 119/80  Pulse: 98  Resp: 18  Temp: 97.3 F (36.3 C)  TempSrc: Oral     Physical Exam  In general this is a pleasant elderly female in no distress lying comfortably in bed.  Her skin is warm and dry.  Eyes she does have prescription lenses Sclera and conjunctiva are clear.  Oropharynx clear mucous membranes moist.  Heart is irregular irregular rate and rhythm-she continues to have venous stasis changes bilaterally she has compression hose applied.  Chest is clear to auscultation there is no labored breathing.  Abdomen is soft nontender with positive bowel sounds.  Muscle skeletal does move all extremities 4 ambulates in a wheelchair compression hose are applied lower extremities bilaterally.  Neurologic is grossly intact she is alert her speech is clear no lateralizing findings. Strength appears to be intact all 4 extremities  Psych she is oriented to self pleasant and follows commands but is somewhat confused      Labs reviewed:  Recent Labs  12/01/15 0734 12/15/15 0730 01/15/16 0710  NA 135 137 139  K 4.4 3.8 4.0  CL 108 105 105  CO2 23 28 27   GLUCOSE 196* 153* 196*  BUN 33* 15 20  CREATININE 1.02* 0.84 0.83  CALCIUM 8.7* 8.0* 9.1    Recent Labs  03/24/15 1649 06/30/15 1712 01/15/16 0710  AST 16 15 20   ALT 21 13 16   ALKPHOS 66 58 61  BILITOT 0.3 0.2 0.6  PROT 7.5 7.3 7.6  ALBUMIN 3.7 3.5 3.3*    Recent Labs  12/01/15 0734 01/15/16 0500  WBC 7.5 7.1  NEUTROABS 4.9 4.0  HGB 12.0 14.0  HCT 37.4 44.3  MCV 92.3 94.7  PLT 300 194   Lab Results  Component Value Date   TSH 2.936 09/09/2011   Lab Results  Component Value Date   HGBA1C 8.6 (H) 01/15/2016   Lab Results  Component Value Date   CHOL 153 09/21/2011   HDL 47 09/21/2011   LDLCALC 76 09/21/2011   TRIG 148 09/21/2011   CHOLHDL  3.3 09/21/2011    Significant Diagnostic Results in last 30 days:  No results found.  Assessment/Plan  #1 diabetes type 2-as noted above this appears to be improving she is on Humulin 70-30 48 units in the  morning 45 units at night-she is also on Glucophage 500 mg twice a day at this point will continue to monitor.  #2 history of atrial fibrillation this appears rate controlled on digoxin and Lopressor she is on Xarelto for anticoagulation I notice variable heart rates from the high 50s to slightly over 100 appear to be quite a bit of variability at this point will monitor heart rate auscultated in the 90s today  She is not complaining of any syncope palpitations shortness of breath or chest pain.  Will update a digoxin level.  #3 history of venous stasis changes again her wounds have healed she is on low-dose Lasix with potassium will update a metabolic panel her weights appear to be stable.  #4 dementia with behaviors this is been quite stable although initially this was somewhat of an issue during her stay-she is on Namenda as well as when necessary Ativan.  #5 history of diabetic retinopathy she is followed closely by ophthalmology she has follow-up scheduled on 03/21/2016-emphasis is on strict as possible diabetic control.  #6-history of hyperlipidemia she is on protocol will update a fasting lipid panel liver function tests back in September appear to be unremarkable.  #7 history of allergic rhinitis she continues on Zyrtec this is been stable as well as.  #8 history of orbital fracture this appears to have healed unremarkably she has no complaints of headache or visual changes beyond baseline.  #9 history of vitamin D deficiency she is on supplementation Will update a vitamin D level.  #10 history of insomnia she is on trazodone at night apparently this is helping  #11 depression she is on Celexa this is been quite stable as well she is bright alert cooperative really have not heard of any recent issues.  [id number] note greater than 40 minutes spent assessing patient-discussing her status with nursing staff-reviewing her chart-her labs-and coordinating and formulating a plan of care  for numerous diagnoses-of note greater than 50% of time spent coordinating plan of care

## 2016-03-15 ENCOUNTER — Encounter (HOSPITAL_COMMUNITY)
Admission: RE | Admit: 2016-03-15 | Discharge: 2016-03-15 | Disposition: A | Discharge status: Home / Self Care | Payer: Medicare Other | Source: Skilled Nursing Facility | Attending: Internal Medicine | Admitting: Internal Medicine

## 2016-03-15 DIAGNOSIS — F3289 Other specified depressive episodes: Secondary | ICD-10-CM | POA: Insufficient documentation

## 2016-03-15 DIAGNOSIS — S0231XD Fracture of orbital floor, right side, subsequent encounter for fracture with routine healing: Secondary | ICD-10-CM | POA: Diagnosis present

## 2016-03-15 DIAGNOSIS — I5042 Chronic combined systolic (congestive) and diastolic (congestive) heart failure: Secondary | ICD-10-CM | POA: Diagnosis not present

## 2016-03-15 DIAGNOSIS — I482 Chronic atrial fibrillation: Secondary | ICD-10-CM | POA: Insufficient documentation

## 2016-03-15 LAB — CBC
HCT: 42.1 % (ref 36.0–46.0)
Hemoglobin: 13.8 g/dL (ref 12.0–15.0)
MCH: 30.3 pg (ref 26.0–34.0)
MCHC: 32.8 g/dL (ref 30.0–36.0)
MCV: 92.3 fL (ref 78.0–100.0)
Platelets: 214 10*3/uL (ref 150–400)
RBC: 4.56 MIL/uL (ref 3.87–5.11)
RDW: 13.5 % (ref 11.5–15.5)
WBC: 7.5 10*3/uL (ref 4.0–10.5)

## 2016-03-15 LAB — BASIC METABOLIC PANEL
Anion gap: 8 (ref 5–15)
BUN: 24 mg/dL — AB (ref 6–20)
CALCIUM: 9.2 mg/dL (ref 8.9–10.3)
CO2: 26 mmol/L (ref 22–32)
CREATININE: 0.93 mg/dL (ref 0.44–1.00)
Chloride: 103 mmol/L (ref 101–111)
GFR calc Af Amer: >60 mL/min (ref >60)
GFR, EST NON AFRICAN AMERICAN: 59 mL/min — AB (ref >60)
GLUCOSE: 196 mg/dL — AB (ref 65–99)
POTASSIUM: 4.2 mmol/L (ref 3.5–5.1)
SODIUM: 137 mmol/L (ref 135–145)

## 2016-03-15 LAB — DIGOXIN LEVEL: DIGOXIN LVL: 0.5 ng/mL — AB (ref 0.8–2.0)

## 2016-03-16 ENCOUNTER — Encounter (INDEPENDENT_AMBULATORY_CARE_PROVIDER_SITE_OTHER): Payer: Medicare Other | Admitting: Ophthalmology

## 2016-03-16 DIAGNOSIS — I10 Essential (primary) hypertension: Secondary | ICD-10-CM | POA: Diagnosis not present

## 2016-03-16 DIAGNOSIS — H43813 Vitreous degeneration, bilateral: Secondary | ICD-10-CM | POA: Diagnosis not present

## 2016-03-16 DIAGNOSIS — E11311 Type 2 diabetes mellitus with unspecified diabetic retinopathy with macular edema: Secondary | ICD-10-CM | POA: Diagnosis not present

## 2016-03-16 DIAGNOSIS — E113313 Type 2 diabetes mellitus with moderate nonproliferative diabetic retinopathy with macular edema, bilateral: Secondary | ICD-10-CM | POA: Diagnosis not present

## 2016-03-16 DIAGNOSIS — H34833 Tributary (branch) retinal vein occlusion, bilateral, with macular edema: Secondary | ICD-10-CM

## 2016-03-16 DIAGNOSIS — H35033 Hypertensive retinopathy, bilateral: Secondary | ICD-10-CM | POA: Diagnosis not present

## 2016-03-16 LAB — FOLATE RBC
Folate, Hemolysate: 522.9 ng/mL
Folate, RBC: 1304 ng/mL (ref >498)
Hematocrit: 40.1 % (ref 34.0–46.6)

## 2016-03-16 LAB — VITAMIN D 25 HYDROXY (VIT D DEFICIENCY, FRACTURES): Vit D, 25-Hydroxy: 29.6 ng/mL — ABNORMAL LOW (ref 30.0–100.0)

## 2016-04-07 ENCOUNTER — Encounter: Payer: Self-pay | Admitting: Internal Medicine

## 2016-04-07 ENCOUNTER — Non-Acute Institutional Stay (SKILLED_NURSING_FACILITY): Payer: Medicare Other | Admitting: Internal Medicine

## 2016-04-07 DIAGNOSIS — B349 Viral infection, unspecified: Secondary | ICD-10-CM

## 2016-04-07 DIAGNOSIS — Z794 Long term (current) use of insulin: Secondary | ICD-10-CM | POA: Diagnosis not present

## 2016-04-07 DIAGNOSIS — IMO0002 Reserved for concepts with insufficient information to code with codable children: Secondary | ICD-10-CM

## 2016-04-07 DIAGNOSIS — F0391 Unspecified dementia with behavioral disturbance: Secondary | ICD-10-CM

## 2016-04-07 DIAGNOSIS — E1165 Type 2 diabetes mellitus with hyperglycemia: Secondary | ICD-10-CM

## 2016-04-07 DIAGNOSIS — E1142 Type 2 diabetes mellitus with diabetic polyneuropathy: Secondary | ICD-10-CM | POA: Diagnosis not present

## 2016-04-07 NOTE — Progress Notes (Signed)
Location:   [facility] Room Number: 113/W Place of Service:  SNF (31) Provider:  Jefm Bryant, MD  Patient Care Team: Celedonio Savage, MD as PCP - General (Family Medicine) Christine Savannah, MD (Cardiology) Christine Colonel, NP as Nurse Practitioner (Nurse Practitioner)  Extended Emergency Contact Information Primary Emergency Contact: Christine [name] Address: [address]          [name], [place] [address] Christine [name] of [place] Phone: [phone] Mobile Phone: [phone] Relation: Daughter  Code Status:  DNR Goals of care: Advanced Directive information Advanced Directives 04/07/2016  Does Patient Have a Medical Advance Directive? Yes  Type of Advance Directive Out of facility DNR (pink MOST or yellow form)  Does patient want to make changes to medical advance directive? No - Patient declined  Copy of Jal in Chart? -     Chief Complaint  Patient presents with  . Acute Visit    Cough and Congestion    HPI:  Pt is a 75 y.o. female seen today for an acute visit for Cough and congestion.  Patient has h/o diabetes type 2,atrial fibrillation, Venous stasis,dementia,allergic rhinitis. Nurses have noticed patient having cough which is mostly dry. Patient is not very reliable but says her chest doesn't hurt. She denies any SOB or chuills. She has had no fever. Reviewing her BS in the mornings and evening are running mostly less then 200 .      Past Medical History:  Diagnosis Date  . Allergy   . Atrial fibrillation ([facility]) 08/2011   First diagnosed in 08/2011; duration of arrhythmia is uncertain  . Bilateral lower extremity edema   . Chronic diarrhea    diverticulosis  . COPD (chronic obstructive pulmonary disease) ([place])   . Dementia   . Diabetes mellitus, type 2 (HCC)    Diabetic neuropathy  . Dyspnea on exertion    pedal edema  . Gout   . Headache(784.0)    twice weekly  . Hyperlipidemia   .  Hypertension   . Osteopenia    DEXA scan 01/2010  . Palpitations   . Seasonal allergies   . Stress incontinence   . Vertigo    Past Surgical History:  Procedure Laterality Date  . APPENDECTOMY    . BREAST BIOPSY  2002   Right  . CESAREAN SECTION     x 2  . CHOLECYSTECTOMY    . COLONOSCOPY  2007   Negative screening study  . HAMMER TOE SURGERY     Bilateral hammer toe amputation  . INCISIONAL HERNIA REPAIR    . KNEE ARTHROSCOPY W/ MENISCAL REPAIR  2007   Bilateral  . UMBILICAL HERNIA REPAIR      Allergies  Allergen Reactions  . Codeine Anaphylaxis and Hives  . Morphine And Related Anaphylaxis and Hives  . Penicillins Anaphylaxis  . Ace Inhibitors Cough    Current Outpatient Prescriptions on File Prior to Visit  Medication Sig Dispense Refill  . acetaminophen (TYLENOL) 325 MG tablet Take 650 mg by mouth every 6 (six) hours as needed.    . carboxymethylcellulose (REFRESH PLUS) 0.5 % SOLN Place 1 drop into both eyes 4 (four) times daily.    . cetirizine (ZYRTEC) 10 MG tablet Take 1 tablet (10 mg total) by mouth daily. 90 tablet 3  . cholecalciferol (VITAMIN D) 1000 UNITS tablet Take 1,000 Units by mouth daily.    . citalopram (CELEXA) 20 MG tablet Take 1 tablet (20 mg total)  by mouth daily. 90 tablet 3  . digoxin (LANOXIN) 0.125 MG tablet Take 1 tablet by mouth once a day (HOLD FOR AP UNDER 60)    . docusate sodium (COLACE) 100 MG capsule Take 200 mg by mouth daily as needed.     . furosemide (LASIX) 20 MG tablet Take 20 mg by mouth.    . Insulin Isophane & Regular Human (HUMULIN 70/30 KWIKPEN) (70-30) 100 UNIT/ML PEN Give 48 units subcutaneous in mornings. Give 40 units in the evening.    . insulin NPH Human (NOVOLIN N) 100 UNIT/ML injection Give per sliding scale three times a day    . Insulin Pen Needle (EASY TOUCH PEN NEEDLES) 31G X 8 MM MISC Use twice a day    . LORazepam (ATIVAN) 0.5 MG tablet Take one tablet by mouth every 8 hours as needed for anxiety 90 tablet 0    . Magnesium Hydroxide (MILK OF MAGNESIA PO) Give 30 ml by mouth for as needed for constipation , may try fleets enema    . memantine (NAMENDA XR) 14 MG CP24 24 hr capsule Take 14 mg by mouth daily.    . metFORMIN (GLUCOPHAGE) 500 MG tablet Take 500 mg by mouth 2 (two) times daily with a meal.     . metoprolol (LOPRESSOR) 100 MG tablet Take 1 tablet (100 mg total) by mouth 2 (two) times daily. 180 tablet 3  . Multiple Vitamin (MULTIVITAMIN WITH MINERALS) TABS Take 1 tablet by mouth every morning.    Marland Kitchen POTASSIUM CHLORIDE ER PO Potassium chloride extended release capsule, give 10 meq by mouth once day    . pravastatin (PRAVACHOL) 40 MG tablet Take 1 tablet (40 mg total) by mouth every evening. 90 tablet 3  . Probiotic Product (RISA-BID PROBIOTIC PO) Take 1 tablet by mouth twice a day    . traZODone (DESYREL) 50 MG tablet Take 50 mg by mouth at bedtime. As needed for sleep.    Alveda Reasons 20 MG TABS tablet TAKE (1) TABLET BY MOUTH ONCE DAILY. 30 tablet 2  . [DISCONTINUED] K-DUR 20 MEQ tablet TAKE 2 TABLETS BY MOUTH ONCE DAILY. 60 tablet 5   No current facility-administered medications on file prior to visit.     Review of Systems  Constitutional: Negative for activity change, appetite change, chills, diaphoresis, fatigue and fever.  HENT: Positive for congestion, postnasal drip and rhinorrhea. Negative for sinus pain, sinus pressure and sore throat.   Respiratory: Positive for cough. Negative for apnea, choking, chest tightness and shortness of breath.   Cardiovascular: Positive for leg swelling. Negative for chest pain and palpitations.  Gastrointestinal: Negative for abdominal distention, abdominal pain, constipation, diarrhea and nausea.    Immunization History  Administered Date(s) Administered  . Influenza-Unspecified 02/04/2016  . Pneumococcal-Unspecified 02/08/2016   Pertinent  Health Maintenance Due  Topic Date Due  . URINE MICROALBUMIN  02/24/2016  . FOOT EXAM  11/29/2016  (Originally 03/18/1951)  . OPHTHALMOLOGY EXAM  11/29/2016 (Originally 03/18/1951)  . DEXA SCAN  11/29/2016 (Originally 03/18/2005)  . HEMOGLOBIN A1C  07/14/2016  . PNA vac Low Risk Adult (2 of 2 - PCV13) 02/07/2017  . INFLUENZA VACCINE  Completed   Fall Risk  11/06/2015 09/29/2015 08/31/2015 06/30/2015 02/24/2015  Falls in the past year? Yes No No No Yes  Number falls in past yr: 2 or more - - - 2 or more  Injury with Fall? No - - - No   Functional Status Survey:    Vitals:   04/07/16  1502  BP: 124/83  Pulse: 93  Resp: 16  Temp: 98.2 F (36.8 C)  TempSrc: Oral   There is no height or weight on file to calculate BMI. Physical Exam  Constitutional: She appears well-developed and well-nourished.  HENT:  Head: Normocephalic.  Nose: Right sinus exhibits no maxillary sinus tenderness and no frontal sinus tenderness. Left sinus exhibits no maxillary sinus tenderness and no frontal sinus tenderness.  Mouth/Throat: Oropharynx is clear and moist. No posterior oropharyngeal erythema.  Cardiovascular: Normal rate and normal heart sounds.   Pulmonary/Chest: Effort normal and breath sounds normal. No respiratory distress. She has no wheezes. She has no rales.  Abdominal: Soft. Bowel sounds are normal. She exhibits no distension. There is no tenderness. There is no rebound.  Musculoskeletal: She exhibits edema.  Neurological: She is alert.    Labs reviewed:  Recent Labs  12/15/15 0730 01/15/16 0710 03/15/16 0500  NA 137 139 137  K 3.8 4.0 4.2  CL 105 105 103  CO2 28 27 26   GLUCOSE 153* 196* 196*  BUN 15 20 24*  CREATININE 0.84 0.83 0.93  CALCIUM 8.0* 9.1 9.2    Recent Labs  06/30/15 1712 01/15/16 0710  AST 15 20  ALT 13 16  ALKPHOS 58 61  BILITOT 0.2 0.6  PROT 7.3 7.6  ALBUMIN 3.5 3.3*    Recent Labs  12/01/15 0734 01/15/16 0500 03/15/16 0500  WBC 7.5 7.1 7.5  NEUTROABS 4.9 4.0  --   HGB 12.0 14.0 13.8  HCT 37.4 44.3 42.1  40.1  MCV 92.3 94.7 92.3  PLT 300 194  214   Lab Results  Component Value Date   TSH 2.936 09/09/2011   Lab Results  Component Value Date   HGBA1C 8.6 (H) 01/15/2016   Lab Results  Component Value Date   CHOL 153 09/21/2011   HDL 47 09/21/2011   LDLCALC 76 09/21/2011   TRIG 148 09/21/2011   CHOLHDL 3.3 09/21/2011    Significant Diagnostic Results in last 30 days:  No results found.  Assessment/Plan  Viral rhinitis Patient already on Zyrtec. Will start on Mucinex Don't think she needs Antibiotics. Seems more viral Illness.  Diabetes Mellitus reviewed Her Bs. No change required on her Insulin right now. Due for A1C this time.   Family/ staff Communication:   Labs/tests ordered:

## 2016-04-11 ENCOUNTER — Non-Acute Institutional Stay (SKILLED_NURSING_FACILITY): Payer: Medicare Other | Admitting: Internal Medicine

## 2016-04-11 ENCOUNTER — Encounter: Payer: Self-pay | Admitting: Internal Medicine

## 2016-04-11 DIAGNOSIS — E1142 Type 2 diabetes mellitus with diabetic polyneuropathy: Secondary | ICD-10-CM | POA: Diagnosis not present

## 2016-04-11 DIAGNOSIS — Z794 Long term (current) use of insulin: Secondary | ICD-10-CM | POA: Diagnosis not present

## 2016-04-11 DIAGNOSIS — E1165 Type 2 diabetes mellitus with hyperglycemia: Secondary | ICD-10-CM

## 2016-04-11 DIAGNOSIS — F0391 Unspecified dementia with behavioral disturbance: Secondary | ICD-10-CM

## 2016-04-11 DIAGNOSIS — M25474 Effusion, right foot: Secondary | ICD-10-CM

## 2016-04-11 DIAGNOSIS — IMO0002 Reserved for concepts with insufficient information to code with codable children: Secondary | ICD-10-CM

## 2016-04-11 NOTE — Progress Notes (Signed)
Location:   [facility] Room Number: 113/W Place of Service:  SNF (31) Provider:  Jefm Bryant, MD  Patient Care Team: Celedonio Savage, MD as PCP - General (Family Medicine) Yehuda Savannah, MD (Cardiology) Lendon Colonel, NP as Nurse Practitioner (Nurse Practitioner)  Extended Emergency Contact Information Primary Emergency Contact: [facility] Address: [address]          [place], [place] [address] [name] of [place] Phone: [phone] Mobile Phone: [phone] Relation: Daughter  Code Status: DNR Goals of care: Advanced Directive information Advanced Directives 04/11/2016  Does Patient Have a Medical Advance Directive? Yes  Type of Advance Directive Out of facility DNR (pink MOST or yellow form)  Does patient want to make changes to medical advance directive? No - Patient declined  Copy of [place] in Chart? -     Chief Complaint  Patient presents with  . Acute Visit    Right foot Red and Swollen     HPI:  Pt is a 75 y.o. female seen today for an acute visit for Right foot Swelling. Patient has h/o Dementia and unable to give any history but per the Nurses when they removed the dressing this morning her foot was swollen and red. But right now patient was sitting in her wheel chair . Her ted hoses are off and she has swelling in both Lower Extremities. patient denies any pain. Patient has not had any recent injury or fall. She does not have fever or chills.  Patient also has h/o Diabetes and Atrial fibrillation on Chronic Anticoagulation.   Past Medical History:  Diagnosis Date  . Allergy   . Atrial fibrillation ([place]) 08/2011   First diagnosed in 08/2011; duration of arrhythmia is uncertain  . Bilateral lower extremity edema   . Chronic diarrhea    diverticulosis  . COPD (chronic obstructive pulmonary disease) ([place])   . Dementia   . Diabetes mellitus, type 2 (HCC)    Diabetic neuropathy  .  Dyspnea on exertion    pedal edema  . Gout   . Headache(784.0)    twice weekly  . Hyperlipidemia   . Hypertension   . Osteopenia    DEXA scan 01/2010  . Palpitations   . Seasonal allergies   . Stress incontinence   . Vertigo    Past Surgical History:  Procedure Laterality Date  . APPENDECTOMY    . BREAST BIOPSY  2002   Right  . CESAREAN SECTION     x 2  . CHOLECYSTECTOMY    . COLONOSCOPY  2007   Negative screening study  . HAMMER TOE SURGERY     Bilateral hammer toe amputation  . INCISIONAL HERNIA REPAIR    . KNEE ARTHROSCOPY W/ MENISCAL REPAIR  2007   Bilateral  . UMBILICAL HERNIA REPAIR      Allergies  Allergen Reactions  . Codeine Anaphylaxis and Hives  . Morphine And Related Anaphylaxis and Hives  . Penicillins Anaphylaxis  . Ace Inhibitors Cough    Current Outpatient Prescriptions on File Prior to Visit  Medication Sig Dispense Refill  . acetaminophen (TYLENOL) 325 MG tablet Take 650 mg by mouth every 6 (six) hours as needed.    . carboxymethylcellulose (REFRESH PLUS) 0.5 % SOLN Place 1 drop into both eyes 4 (four) times daily.    . cetirizine (ZYRTEC) 10 MG tablet Take 1 tablet (10 mg total) by mouth daily. 90 tablet 3  . cholecalciferol (VITAMIN D)  1000 UNITS tablet Take 1,000 Units by mouth daily.    . citalopram (CELEXA) 20 MG tablet Take 1 tablet (20 mg total) by mouth daily. 90 tablet 3  . digoxin (LANOXIN) 0.125 MG tablet Take 1 tablet by mouth once a day (HOLD FOR AP UNDER 60)    . docusate sodium (COLACE) 100 MG capsule Take 200 mg by mouth daily as needed.     . furosemide (LASIX) 20 MG tablet Take 20 mg by mouth.    . Insulin Isophane & Regular Human (HUMULIN 70/30 KWIKPEN) (70-30) 100 UNIT/ML PEN Give 48 units subcutaneous in mornings. Give 40 units in the evening.    . insulin NPH Human (NOVOLIN N) 100 UNIT/ML injection Give per sliding scale three times a day    . Insulin Pen Needle (EASY TOUCH PEN NEEDLES) 31G X 8 MM MISC Use twice a day    .  LORazepam (ATIVAN) 0.5 MG tablet Take one tablet by mouth every 8 hours as needed for anxiety 90 tablet 0  . Magnesium Hydroxide (MILK OF MAGNESIA PO) Give 30 ml by mouth for as needed for constipation , may try fleets enema    . memantine (NAMENDA XR) 14 MG CP24 24 hr capsule Take 14 mg by mouth daily.    . metFORMIN (GLUCOPHAGE) 500 MG tablet Take 500 mg by mouth 2 (two) times daily with a meal.     . metoprolol (LOPRESSOR) 100 MG tablet Take 1 tablet (100 mg total) by mouth 2 (two) times daily. 180 tablet 3  . Multiple Vitamin (MULTIVITAMIN WITH MINERALS) TABS Take 1 tablet by mouth every morning.    Marland Kitchen POTASSIUM CHLORIDE ER PO Potassium chloride extended release capsule, give 10 meq by mouth once day    . pravastatin (PRAVACHOL) 40 MG tablet Take 1 tablet (40 mg total) by mouth every evening. 90 tablet 3  . Probiotic Product (RISA-BID PROBIOTIC PO) Take 1 tablet by mouth twice a day    . traZODone (DESYREL) 50 MG tablet Take 50 mg by mouth at bedtime. As needed for sleep.    Alveda Reasons 20 MG TABS tablet TAKE (1) TABLET BY MOUTH ONCE DAILY. 30 tablet 2  . [DISCONTINUED] K-DUR 20 MEQ tablet TAKE 2 TABLETS BY MOUTH ONCE DAILY. 60 tablet 5   No current facility-administered medications on file prior to visit.     Review of Systems  Reason unable to perform ROS: Not reliable due to patient dementia.    Immunization History  Administered Date(s) Administered  . Influenza-Unspecified 02/04/2016  . Pneumococcal-Unspecified 02/08/2016   Pertinent  Health Maintenance Due  Topic Date Due  . URINE MICROALBUMIN  02/24/2016  . FOOT EXAM  11/29/2016 (Originally 03/18/1951)  . OPHTHALMOLOGY EXAM  11/29/2016 (Originally 03/18/1951)  . DEXA SCAN  11/29/2016 (Originally 03/18/2005)  . HEMOGLOBIN A1C  07/14/2016  . PNA vac Low Risk Adult (2 of 2 - PCV13) 02/07/2017  . INFLUENZA VACCINE  Completed   Fall Risk  11/06/2015 09/29/2015 08/31/2015 06/30/2015 02/24/2015  Falls in the past year? Yes No No No Yes   Number falls in past yr: 2 or more - - - 2 or more  Injury with Fall? No - - - No   Functional Status Survey:    Vitals:   04/11/16 1435  BP: 122/71  Pulse: 86  Resp: 20  Temp: 98.6 F (37 C)  TempSrc: Oral   There is no height or weight on file to calculate BMI. Physical Exam  Constitutional: She  appears well-developed and well-nourished.  HENT:  Head: Normocephalic.  Mouth/Throat: Oropharynx is clear and moist.  Cardiovascular: Normal rate and normal heart sounds.   Pulmonary/Chest: No respiratory distress. She has no wheezes. She has no rales.  Abdominal: Soft. Bowel sounds are normal. She exhibits no distension. There is no tenderness.  Musculoskeletal:  Patient has moderate edema in Both Lower extremities.With some redness Bilateral. But the Feet are not warm. She has good motion in her Left ankle but not so much in her Right Ankle. Thought No pain when I was trying to press on the foot. and the ankle. No calf pain. She does have some chronic healing Ulcer in her Calf area in right Lower Leg but no redness.  Neurological: She is alert.  Psychiatric: She has a normal mood and affect. Her behavior is normal.    Labs reviewed:  Recent Labs  12/15/15 0730 01/15/16 0710 03/15/16 0500  NA 137 139 137  K 3.8 4.0 4.2  CL 105 105 103  CO2 28 27 26   GLUCOSE 153* 196* 196*  BUN 15 20 24*  CREATININE 0.84 0.83 0.93  CALCIUM 8.0* 9.1 9.2    Recent Labs  06/30/15 1712 01/15/16 0710  AST 15 20  ALT 13 16  ALKPHOS 58 61  BILITOT 0.2 0.6  PROT 7.3 7.6  ALBUMIN 3.5 3.3*    Recent Labs  12/01/15 0734 01/15/16 0500 03/15/16 0500  WBC 7.5 7.1 7.5  NEUTROABS 4.9 4.0  --   HGB 12.0 14.0 13.8  HCT 37.4 44.3 42.1  40.1  MCV 92.3 94.7 92.3  PLT 300 194 214   Lab Results  Component Value Date   TSH 2.936 09/09/2011   Lab Results  Component Value Date   HGBA1C 8.6 (H) 01/15/2016   Lab Results  Component Value Date   CHOL 153 09/21/2011   HDL 47 09/21/2011    LDLCALC 76 09/21/2011   TRIG 148 09/21/2011   CHOLHDL 3.3 09/21/2011    Significant Diagnostic Results in last 30 days:  No results found.  Assessment/Plan  Right Foot Swelling with redness  D/W the Nurse again. The redness has improved since her tight Stockings were removed. She did Have some restriction in motion in right Ankle joint. 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 Though patient has not had any noted history will Repeat the X ray of right foot to rule out any Acute Fracture. Continue Ted hoses. Will keep on monitoring the Leg. No signs of any Cellulitis. The changes are most likely due to Chronic Venous stasis.  Diabetes Mellitus BS running anywhere b/w - 250.  Will continue same dose of insulin Patient also in sliding scale. Dementia Continue support. Patient clinically stable.   Family/ staff Communication:   Labs/tests ordered:  X ray of Right Foot.

## 2016-04-15 ENCOUNTER — Encounter (INDEPENDENT_AMBULATORY_CARE_PROVIDER_SITE_OTHER): Payer: Medicare Other | Admitting: Ophthalmology

## 2016-04-15 DIAGNOSIS — H34832 Tributary (branch) retinal vein occlusion, left eye, with macular edema: Secondary | ICD-10-CM | POA: Diagnosis not present

## 2016-04-15 DIAGNOSIS — E113391 Type 2 diabetes mellitus with moderate nonproliferative diabetic retinopathy without macular edema, right eye: Secondary | ICD-10-CM

## 2016-04-15 DIAGNOSIS — I10 Essential (primary) hypertension: Secondary | ICD-10-CM | POA: Diagnosis not present

## 2016-04-15 DIAGNOSIS — E11311 Type 2 diabetes mellitus with unspecified diabetic retinopathy with macular edema: Secondary | ICD-10-CM

## 2016-04-15 DIAGNOSIS — H348312 Tributary (branch) retinal vein occlusion, right eye, stable: Secondary | ICD-10-CM

## 2016-04-15 DIAGNOSIS — H35033 Hypertensive retinopathy, bilateral: Secondary | ICD-10-CM

## 2016-04-15 DIAGNOSIS — E113312 Type 2 diabetes mellitus with moderate nonproliferative diabetic retinopathy with macular edema, left eye: Secondary | ICD-10-CM | POA: Diagnosis not present

## 2016-04-15 DIAGNOSIS — H43813 Vitreous degeneration, bilateral: Secondary | ICD-10-CM | POA: Diagnosis not present

## 2016-05-11 ENCOUNTER — Other Ambulatory Visit (HOSPITAL_COMMUNITY)
Admission: AD | Admit: 2016-05-11 | Discharge: 2016-05-11 | Disposition: A | Discharge status: Home / Self Care | Payer: Medicare Other | Source: Skilled Nursing Facility | Attending: Internal Medicine | Admitting: Internal Medicine

## 2016-05-11 ENCOUNTER — Non-Acute Institutional Stay (SKILLED_NURSING_FACILITY): Payer: Medicare Other | Admitting: Internal Medicine

## 2016-05-11 ENCOUNTER — Encounter: Payer: Self-pay | Admitting: Internal Medicine

## 2016-05-11 DIAGNOSIS — I10 Essential (primary) hypertension: Secondary | ICD-10-CM

## 2016-05-11 DIAGNOSIS — E1165 Type 2 diabetes mellitus with hyperglycemia: Secondary | ICD-10-CM

## 2016-05-11 DIAGNOSIS — E1142 Type 2 diabetes mellitus with diabetic polyneuropathy: Secondary | ICD-10-CM | POA: Diagnosis not present

## 2016-05-11 DIAGNOSIS — Z794 Long term (current) use of insulin: Secondary | ICD-10-CM

## 2016-05-11 DIAGNOSIS — I4891 Unspecified atrial fibrillation: Secondary | ICD-10-CM

## 2016-05-11 DIAGNOSIS — I872 Venous insufficiency (chronic) (peripheral): Secondary | ICD-10-CM

## 2016-05-11 DIAGNOSIS — M859 Disorder of bone density and structure, unspecified: Secondary | ICD-10-CM | POA: Diagnosis present

## 2016-05-11 DIAGNOSIS — F0391 Unspecified dementia with behavioral disturbance: Secondary | ICD-10-CM

## 2016-05-11 DIAGNOSIS — IMO0002 Reserved for concepts with insufficient information to code with codable children: Secondary | ICD-10-CM

## 2016-05-11 LAB — URINALYSIS, ROUTINE W REFLEX MICROSCOPIC
Bilirubin Urine: NEGATIVE
Glucose, UA: NEGATIVE mg/dL
HGB URINE DIPSTICK: NEGATIVE
KETONES UR: NEGATIVE mg/dL
Nitrite: NEGATIVE
PROTEIN: NEGATIVE mg/dL
Specific Gravity, Urine: 1.02 (ref 1.005–1.030)
pH: 5 (ref 5.0–8.0)

## 2016-05-11 NOTE — Progress Notes (Signed)
Location:   [facility] Room Number: 113/W Place of Service:  SNF (31) Provider:  Jefm Bryant, MD  Patient Care Team: Celedonio Savage, MD as PCP - General (Family Medicine) Yehuda Savannah, MD (Cardiology) Lendon Colonel, NP as Nurse Practitioner (Nurse Practitioner)  Extended Emergency Contact Information Primary Emergency Contact: [facility] Address: [address]          [place], [place] [address] [name] of [place] Phone: [phone] Mobile Phone: [phone] Relation: Daughter  Code Status:  DNR Goals of care: Advanced Directive information Advanced Directives 05/11/2016  Does Patient Have a Medical Advance Directive? Yes  Type of Advance Directive Out of facility DNR (pink MOST or yellow form)  Does patient want to make changes to medical advance directive? No - Patient declined  Copy of [place] in Chart? -     Chief Complaint  Patient presents with  . Medical Management of Chronic Issues    Routine Visit    HPI:  Pt is a 76 y.o. female seen today for medical management of chronic diseases.   Patient has h/o Diabetes mellitus type 2 , atrial fibrillation on chronic anticoagulation, Venous stasis, Dementia with Behavior problems, S/P fall leading to Ocular floor fracture.  Patient has been very stable in the Facility. Her BS have been running in 250-300 in afternoon and around 150 in the Morning. She has gained 6lbs since her last month. She had no complains today. She is sitting in her wheel chair and gets around.   Past Medical History:  Diagnosis Date  . Allergy   . Atrial fibrillation ([place]) 08/2011   First diagnosed in 08/2011; duration of arrhythmia is uncertain  . Bilateral lower extremity edema   . Chronic diarrhea    diverticulosis  . COPD (chronic obstructive pulmonary disease) ([place])   . Dementia   . Diabetes mellitus, type 2 (HCC)    Diabetic neuropathy  . Dyspnea on exertion     pedal edema  . Gout   . Headache(784.0)    twice weekly  . Hyperlipidemia   . Hypertension   . Osteopenia    DEXA scan 01/2010  . Palpitations   . Seasonal allergies   . Stress incontinence   . Vertigo    Past Surgical History:  Procedure Laterality Date  . APPENDECTOMY    . BREAST BIOPSY  2002   Right  . CESAREAN SECTION     x 2  . CHOLECYSTECTOMY    . COLONOSCOPY  2007   Negative screening study  . HAMMER TOE SURGERY     Bilateral hammer toe amputation  . INCISIONAL HERNIA REPAIR    . KNEE ARTHROSCOPY W/ MENISCAL REPAIR  2007   Bilateral  . UMBILICAL HERNIA REPAIR      Allergies  Allergen Reactions  . Codeine Anaphylaxis and Hives  . Morphine And Related Anaphylaxis and Hives  . Penicillins Anaphylaxis  . Ace Inhibitors Cough    Current Outpatient Prescriptions on File Prior to Visit  Medication Sig Dispense Refill  . acetaminophen (TYLENOL) 325 MG tablet Take 650 mg by mouth every 6 (six) hours as needed.    . carboxymethylcellulose (REFRESH PLUS) 0.5 % SOLN Place 1 drop into both eyes 4 (four) times daily.    . cetirizine (ZYRTEC) 10 MG tablet Take 1 tablet (10 mg total) by mouth daily. 90 tablet 3  . cholecalciferol (VITAMIN D) 1000 UNITS tablet Take 1,000 Units by mouth daily.    Marland Kitchen  citalopram (CELEXA) 20 MG tablet Take 1 tablet (20 mg total) by mouth daily. 90 tablet 3  . digoxin (LANOXIN) 0.125 MG tablet Take 1 tablet by mouth once a day (HOLD FOR AP UNDER 60)    . docusate sodium (COLACE) 100 MG capsule Take 200 mg by mouth daily as needed.     . furosemide (LASIX) 20 MG tablet Take 20 mg by mouth.    . guaifenesin (HUMIBID E) 400 MG TABS tablet Take 400 mg by mouth every 6 (six) hours as needed.    . Insulin Isophane & Regular Human (HUMULIN 70/30 KWIKPEN) (70-30) 100 UNIT/ML PEN Give 48 units subcutaneous in mornings. Give 40 units in the evening.    . insulin NPH Human (NOVOLIN N) 100 UNIT/ML injection Give per sliding scale three times a day    .  Insulin Pen Needle (EASY TOUCH PEN NEEDLES) 31G X 8 MM MISC Use twice a day    . LORazepam (ATIVAN) 0.5 MG tablet Take one tablet by mouth every 8 hours as needed for anxiety 90 tablet 0  . Magnesium Hydroxide (MILK OF MAGNESIA PO) Give 30 ml by mouth for as needed for constipation , may try fleets enema    . memantine (NAMENDA XR) 14 MG CP24 24 hr capsule Take 14 mg by mouth daily.    . metFORMIN (GLUCOPHAGE) 500 MG tablet Take 500 mg by mouth 2 (two) times daily with a meal.     . metoprolol (LOPRESSOR) 100 MG tablet Take 1 tablet (100 mg total) by mouth 2 (two) times daily. 180 tablet 3  . Multiple Vitamin (MULTIVITAMIN WITH MINERALS) TABS Take 1 tablet by mouth every morning.    Marland Kitchen POTASSIUM CHLORIDE ER PO Potassium chloride extended release capsule, give 10 meq by mouth once day    . pravastatin (PRAVACHOL) 40 MG tablet Take 1 tablet (40 mg total) by mouth every evening. 90 tablet 3  . Probiotic Product (RISA-BID PROBIOTIC PO) Take 1 tablet by mouth twice a day    . traZODone (DESYREL) 50 MG tablet Take 50 mg by mouth at bedtime. As needed for sleep.    Alveda Reasons 20 MG TABS tablet TAKE (1) TABLET BY MOUTH ONCE DAILY. 30 tablet 2  . [DISCONTINUED] K-DUR 20 MEQ tablet TAKE 2 TABLETS BY MOUTH ONCE DAILY. 60 tablet 5   No current facility-administered medications on file prior to visit.      Review of Systems  Unable to perform ROS: Dementia    Immunization History  Administered Date(s) Administered  . Influenza-Unspecified 02/04/2016  . Pneumococcal-Unspecified 02/08/2016   Pertinent  Health Maintenance Due  Topic Date Due  . URINE MICROALBUMIN  02/24/2016  . FOOT EXAM  11/29/2016 (Originally 03/18/1951)  . OPHTHALMOLOGY EXAM  11/29/2016 (Originally 03/18/1951)  . DEXA SCAN  11/29/2016 (Originally 03/18/2005)  . HEMOGLOBIN A1C  07/14/2016  . PNA vac Low Risk Adult (2 of 2 - PCV13) 02/07/2017  . INFLUENZA VACCINE  Completed   Fall Risk  11/06/2015 09/29/2015 08/31/2015 06/30/2015  02/24/2015  Falls in the past year? Yes No No No Yes  Number falls in past yr: 2 or more - - - 2 or more  Injury with Fall? No - - - No   Functional Status Survey:    Vitals:   05/11/16 1218  BP: 106/67  Pulse: 99  Resp: 18  Temp: 97.8 F (36.6 C)  TempSrc: Oral   There is no height or weight on file to calculate BMI. Physical  Exam  Constitutional: She appears well-developed and well-nourished.  HENT:  Head: Normocephalic.  Mouth/Throat: Oropharynx is clear and moist.  Eyes: Pupils are equal, round, and reactive to light.  Neck: Neck supple.  Cardiovascular: Normal rate and normal heart sounds.  An irregular rhythm present.  Pulmonary/Chest: Effort normal and breath sounds normal. No respiratory distress. She has no wheezes. She has no rales.  Abdominal: Soft. Bowel sounds are normal. She exhibits no distension. There is no tenderness. There is no rebound.  Musculoskeletal: She exhibits edema.  Lymphadenopathy:    She has no cervical adenopathy.  Neurological: She is alert.  Skin: Skin is warm and dry. No rash noted. No erythema. No pallor.  Psychiatric: She has a normal mood and affect. Her behavior is normal.    Labs reviewed:  Recent Labs  12/15/15 0730 01/15/16 0710 03/15/16 0500  NA 137 139 137  K 3.8 4.0 4.2  CL 105 105 103  CO2 28 27 26   GLUCOSE 153* 196* 196*  BUN 15 20 24*  CREATININE 0.84 0.83 0.93  CALCIUM 8.0* 9.1 9.2    Recent Labs  06/30/15 1712 01/15/16 0710  AST 15 20  ALT 13 16  ALKPHOS 58 61  BILITOT 0.2 0.6  PROT 7.3 7.6  ALBUMIN 3.5 3.3*    Recent Labs  12/01/15 0734 01/15/16 0500 03/15/16 0500  WBC 7.5 7.1 7.5  NEUTROABS 4.9 4.0  --   HGB 12.0 14.0 13.8  HCT 37.4 44.3 42.1  40.1  MCV 92.3 94.7 92.3  PLT 300 194 214   Lab Results  Component Value Date   TSH 2.936 09/09/2011   Lab Results  Component Value Date   HGBA1C 8.6 (H) 01/15/2016   Lab Results  Component Value Date   CHOL 153 09/21/2011   HDL 47  09/21/2011   LDLCALC 76 09/21/2011   TRIG 148 09/21/2011   CHOLHDL 3.3 09/21/2011    Significant Diagnostic Results in last 30 days:  No results found.  Assessment/Plan  Uncontrolled type 2 diabetes mellitus with diabetic polyneuropathy Patient BS were reviewed . She is doing well with morning sugars but her Afternoon and evening sugars are high. Will increase her Morning insulin to 52 units  Repeat A1C Check Urine for Microalbumin   Atrial fibrillation  Rate control on Digoxin and Lopressor Continue on Xarelto  Venous stasis dermatitis of both lower extremities Continues to have swelling in her Lower extremeties. She is on Low dose of lasix Will try to see if she will wear Ted hoses to help her stasis. All her Open wound have healed. Her weight is up little bit but will monitor for now. No Change in lasix  Essential hypertension BP controlled   Dementia with behavioral disturbance, Doing well on Namenda.And Celexa  Also will ger Dexa scan on patient to Rule out osteoporosis.   Family/ staff Communication:   Labs/tests ordered:  CMP, CBC, A1C, DEXA scan, Lipid panel, Urine for Microalbumin

## 2016-05-12 ENCOUNTER — Non-Acute Institutional Stay (SKILLED_NURSING_FACILITY): Payer: Medicare Other | Admitting: Internal Medicine

## 2016-05-12 ENCOUNTER — Encounter: Payer: Self-pay | Admitting: Internal Medicine

## 2016-05-12 ENCOUNTER — Encounter (HOSPITAL_COMMUNITY)
Admission: RE | Admit: 2016-05-12 | Discharge: 2016-05-12 | Disposition: A | Discharge status: Home / Self Care | Payer: Medicare Other | Source: Skilled Nursing Facility | Attending: Internal Medicine | Admitting: Internal Medicine

## 2016-05-12 DIAGNOSIS — S0231XD Fracture of orbital floor, right side, subsequent encounter for fracture with routine healing: Secondary | ICD-10-CM | POA: Insufficient documentation

## 2016-05-12 DIAGNOSIS — M810 Age-related osteoporosis without current pathological fracture: Secondary | ICD-10-CM | POA: Diagnosis not present

## 2016-05-12 DIAGNOSIS — Z794 Long term (current) use of insulin: Secondary | ICD-10-CM

## 2016-05-12 DIAGNOSIS — I5042 Chronic combined systolic (congestive) and diastolic (congestive) heart failure: Secondary | ICD-10-CM | POA: Insufficient documentation

## 2016-05-12 DIAGNOSIS — E1142 Type 2 diabetes mellitus with diabetic polyneuropathy: Secondary | ICD-10-CM

## 2016-05-12 DIAGNOSIS — IMO0002 Reserved for concepts with insufficient information to code with codable children: Secondary | ICD-10-CM

## 2016-05-12 DIAGNOSIS — F3289 Other specified depressive episodes: Secondary | ICD-10-CM | POA: Diagnosis present

## 2016-05-12 DIAGNOSIS — E1165 Type 2 diabetes mellitus with hyperglycemia: Secondary | ICD-10-CM

## 2016-05-12 LAB — COMPREHENSIVE METABOLIC PANEL
ALBUMIN: 2.9 g/dL — AB (ref 3.5–5.0)
ALT: 13 U/L — ABNORMAL LOW (ref 14–54)
AST: 13 U/L — AB (ref 15–41)
Alkaline Phosphatase: 49 U/L (ref 38–126)
Anion gap: 5 (ref 5–15)
BUN: 26 mg/dL — AB (ref 6–20)
CHLORIDE: 104 mmol/L (ref 101–111)
CO2: 29 mmol/L (ref 22–32)
Calcium: 9 mg/dL (ref 8.9–10.3)
Creatinine, Ser: 0.91 mg/dL (ref 0.44–1.00)
GFR calc Af Amer: >60 mL/min (ref >60)
GFR calc non Af Amer: 60 mL/min — ABNORMAL LOW (ref >60)
GLUCOSE: 116 mg/dL — AB (ref 65–99)
POTASSIUM: 4.2 mmol/L (ref 3.5–5.1)
Sodium: 138 mmol/L (ref 135–145)
Total Bilirubin: 0.7 mg/dL (ref 0.3–1.2)
Total Protein: 6.6 g/dL (ref 6.5–8.1)

## 2016-05-12 LAB — CBC
HEMATOCRIT: 37.3 % (ref 36.0–46.0)
HEMOGLOBIN: 12 g/dL (ref 12.0–15.0)
MCH: 29.8 pg (ref 26.0–34.0)
MCHC: 32.2 g/dL (ref 30.0–36.0)
MCV: 92.6 fL (ref 78.0–100.0)
Platelets: 224 10*3/uL (ref 150–400)
RBC: 4.03 MIL/uL (ref 3.87–5.11)
RDW: 13.7 % (ref 11.5–15.5)
WBC: 6.5 10*3/uL (ref 4.0–10.5)

## 2016-05-12 LAB — LIPID PANEL
CHOLESTEROL: 129 mg/dL (ref 0–200)
HDL: 35 mg/dL — ABNORMAL LOW (ref >40)
LDL CALC: 79 mg/dL (ref 0–99)
Total CHOL/HDL Ratio: 3.7 RATIO
Triglycerides: 73 mg/dL (ref <150)
VLDL: 15 mg/dL (ref 0–40)

## 2016-05-12 NOTE — Progress Notes (Signed)
Location:   {[facility] Room Number: 113/W Place of Service:  SNF (31) Provider:  Jefm Bryant, MD  Patient Care Team: Celedonio Savage, MD as PCP - General (Family Medicine) Yehuda Savannah, MD (Cardiology) Lendon Colonel, NP as Nurse Practitioner (Nurse Practitioner)  Extended Emergency Contact Information Primary Emergency Contact: [facility] Address: [address]          [place], [place] [address] [name] of [place] Phone: [phone] Mobile Phone: [phone] Relation: Daughter  Code Status:  DNR Goals of care: Advanced Directive information Advanced Directives 05/12/2016  Does Patient Have a Medical Advance Directive? Yes  Type of Advance Directive Out of facility DNR (pink MOST or yellow form)  Does patient want to make changes to medical advance directive? No - Patient declined  Copy of [place] in Chart? -     Chief Complaint  Patient presents with  . Acute Visit    BS and Bone Density    HPI:  Pt is a 76 y.o. female seen today for an acute visit for Follow up on her Bone scan which was done yesterday And also According to the nurse her BS go down lass then 70 when they give her both her evening insulin and sliding scale insulin. Patient has h/o Diabetes mellitus type 2 , atrial fibrillation on chronic anticoagulation, Venous stasis, Dementia with Behavior problems, S/P fall leading to Ocular floor fracture  I had seen patient yesterday and increased her Morning insulin to cover for Afternoon highs. But the nurse told me that she sometimes has low BS in the evening as she gets double coverage with Sliding scale. Patient also had Bone scan done which showed T score of Less then 4.411  Past Medical History:  Diagnosis Date  . Allergy   . Atrial fibrillation ([place]) 08/2011   First diagnosed in 08/2011; duration of arrhythmia is uncertain  . Bilateral lower extremity edema   . Chronic diarrhea      diverticulosis  . COPD (chronic obstructive pulmonary disease) ([place])   . Dementia   . Diabetes mellitus, type 2 (HCC)    Diabetic neuropathy  . Dyspnea on exertion    pedal edema  . Gout   . Headache(784.0)    twice weekly  . Hyperlipidemia   . Hypertension   . Osteopenia    DEXA scan 01/2010  . Palpitations   . Seasonal allergies   . Stress incontinence   . Vertigo    Past Surgical History:  Procedure Laterality Date  . APPENDECTOMY    . BREAST BIOPSY  2002   Right  . CESAREAN SECTION     x 2  . CHOLECYSTECTOMY    . COLONOSCOPY  2007   Negative screening study  . HAMMER TOE SURGERY     Bilateral hammer toe amputation  . INCISIONAL HERNIA REPAIR    . KNEE ARTHROSCOPY W/ MENISCAL REPAIR  2007   Bilateral  . UMBILICAL HERNIA REPAIR      Allergies  Allergen Reactions  . Codeine Anaphylaxis and Hives  . Morphine And Related Anaphylaxis and Hives  . Penicillins Anaphylaxis  . Ace Inhibitors Cough    Current Outpatient Prescriptions on File Prior to Visit  Medication Sig Dispense Refill  . acetaminophen (TYLENOL) 325 MG tablet Take 650 mg by mouth every 6 (six) hours as needed.    . carboxymethylcellulose (REFRESH PLUS) 0.5 % SOLN Place 1 drop into both eyes 4 (four) times daily.    Marland Kitchen  cetirizine (ZYRTEC) 10 MG tablet Take 1 tablet (10 mg total) by mouth daily. 90 tablet 3  . cholecalciferol (VITAMIN D) 1000 UNITS tablet Take 1,000 Units by mouth daily.    . citalopram (CELEXA) 20 MG tablet Take 1 tablet (20 mg total) by mouth daily. 90 tablet 3  . digoxin (LANOXIN) 0.125 MG tablet Take 1 tablet by mouth once a day (HOLD FOR AP UNDER 60)    . docusate sodium (COLACE) 100 MG capsule Take 200 mg by mouth daily as needed.     . furosemide (LASIX) 20 MG tablet Take 20 mg by mouth.    . guaifenesin (HUMIBID E) 400 MG TABS tablet Take 400 mg by mouth every 6 (six) hours as needed.    . Insulin Isophane & Regular Human (HUMULIN 70/30 KWIKPEN) (70-30) 100 UNIT/ML PEN Give  52 units subcutaneous in mornings. Give 40 units in the evening.    . insulin NPH Human (NOVOLIN N) 100 UNIT/ML injection Give per sliding scale three times a day    . Insulin Pen Needle (EASY TOUCH PEN NEEDLES) 31G X 8 MM MISC Use twice a day    . LORazepam (ATIVAN) 0.5 MG tablet Take one tablet by mouth every 8 hours as needed for anxiety 90 tablet 0  . memantine (NAMENDA XR) 14 MG CP24 24 hr capsule Take 14 mg by mouth daily.    . metFORMIN (GLUCOPHAGE) 500 MG tablet Take 500 mg by mouth 2 (two) times daily with a meal.     . metoprolol (LOPRESSOR) 100 MG tablet Take 1 tablet (100 mg total) by mouth 2 (two) times daily. 180 tablet 3  . Multiple Vitamin (MULTIVITAMIN WITH MINERALS) TABS Take 1 tablet by mouth every morning.    Marland Kitchen POTASSIUM CHLORIDE ER PO Potassium chloride extended release capsule, give 10 meq by mouth once day    . pravastatin (PRAVACHOL) 40 MG tablet Take 1 tablet (40 mg total) by mouth every evening. 90 tablet 3  . Probiotic Product (RISA-BID PROBIOTIC PO) Take 1 tablet by mouth twice a day    . traZODone (DESYREL) 50 MG tablet Take 50 mg by mouth at bedtime. As needed for sleep.    Alveda Reasons 20 MG TABS tablet TAKE (1) TABLET BY MOUTH ONCE DAILY. 30 tablet 2  . [DISCONTINUED] K-DUR 20 MEQ tablet TAKE 2 TABLETS BY MOUTH ONCE DAILY. 60 tablet 5   No current facility-administered medications on file prior to visit.      Review of Systems  Unable to perform ROS: Dementia    Immunization History  Administered Date(s) Administered  . Influenza-Unspecified 02/04/2016  . Pneumococcal-Unspecified 02/08/2016   Pertinent  Health Maintenance Due  Topic Date Due  . URINE MICROALBUMIN  02/24/2016  . FOOT EXAM  11/29/2016 (Originally 03/18/1951)  . OPHTHALMOLOGY EXAM  11/29/2016 (Originally 03/18/1951)  . DEXA SCAN  11/29/2016 (Originally 03/18/2005)  . HEMOGLOBIN A1C  07/14/2016  . PNA vac Low Risk Adult (2 of 2 - PCV13) 02/07/2017  . INFLUENZA VACCINE  Completed   Fall  Risk  11/06/2015 09/29/2015 08/31/2015 06/30/2015 02/24/2015  Falls in the past year? Yes No No No Yes  Number falls in past yr: 2 or more - - - 2 or more  Injury with Fall? No - - - No   Functional Status Survey:    Vitals:   05/12/16 1606  BP: 106/64  Pulse: 78  Resp: 20  Temp: 98 F (36.7 C)  TempSrc: Oral  SpO2: 97%  There is no height or weight on file to calculate BMI. Physical Exam  Constitutional: She appears well-developed and well-nourished.  HENT:  Head: Normocephalic.  Mouth/Throat: Oropharynx is clear and moist.  Eyes: Pupils are equal, round, and reactive to light.  Neck: Neck supple.  Cardiovascular: Normal rate and normal heart sounds.   Pulmonary/Chest: Effort normal and breath sounds normal. No respiratory distress. She has no wheezes. She has no rales.  Abdominal: Soft. Bowel sounds are normal. She exhibits no distension. There is no tenderness.  Musculoskeletal: She exhibits edema.  Neurological: She is alert.  Skin: Skin is warm and dry.    Labs reviewed:  Recent Labs  01/15/16 0710 03/15/16 0500 05/12/16 0500  NA 139 137 138  K 4.0 4.2 4.2  CL 105 103 104  CO2 27 26 29   GLUCOSE 196* 196* 116*  BUN 20 24* 26*  CREATININE 0.83 0.93 0.91  CALCIUM 9.1 9.2 9.0    Recent Labs  06/30/15 1712 01/15/16 0710 05/12/16 0500  AST 15 20 13*  ALT 13 16 13*  ALKPHOS 58 61 49  BILITOT 0.2 0.6 0.7  PROT 7.3 7.6 6.6  ALBUMIN 3.5 3.3* 2.9*    Recent Labs  12/01/15 0734 01/15/16 0500 03/15/16 0500 05/12/16 0500  WBC 7.5 7.1 7.5 6.5  NEUTROABS 4.9 4.0  --   --   HGB 12.0 14.0 13.8 12.0  HCT 37.4 44.3 42.1  40.1 37.3  MCV 92.3 94.7 92.3 92.6  PLT 300 194 214 224   Lab Results  Component Value Date   TSH 2.936 09/09/2011   Lab Results  Component Value Date   HGBA1C 8.6 (H) 01/15/2016   Lab Results  Component Value Date   CHOL 129 05/12/2016   HDL 35 (L) 05/12/2016   LDLCALC 79 05/12/2016   TRIG 73 05/12/2016   CHOLHDL 3.7 05/12/2016     Significant Diagnostic Results in last 30 days:  No results found.  Assessment/Plan  Uncontrolled type 2 diabetes mellitus Will continue to increase the morning Insulin but will not give her any coverage from Sliding scale. That way patient would not get double coverage. Sliding scale insulin only of BS more then 300 Osteoporosis . Patient Has very Low T score. Will start her On fosamax weekly. Patients Calcium is normal.   Family/ staff Communication:  Labs/tests ordered:

## 2016-05-13 ENCOUNTER — Encounter (INDEPENDENT_AMBULATORY_CARE_PROVIDER_SITE_OTHER): Payer: Medicare Other | Admitting: Ophthalmology

## 2016-05-13 DIAGNOSIS — I10 Essential (primary) hypertension: Secondary | ICD-10-CM | POA: Diagnosis not present

## 2016-05-13 DIAGNOSIS — H348312 Tributary (branch) retinal vein occlusion, right eye, stable: Secondary | ICD-10-CM

## 2016-05-13 DIAGNOSIS — H34832 Tributary (branch) retinal vein occlusion, left eye, with macular edema: Secondary | ICD-10-CM

## 2016-05-13 DIAGNOSIS — E113312 Type 2 diabetes mellitus with moderate nonproliferative diabetic retinopathy with macular edema, left eye: Secondary | ICD-10-CM | POA: Diagnosis not present

## 2016-05-13 DIAGNOSIS — E113391 Type 2 diabetes mellitus with moderate nonproliferative diabetic retinopathy without macular edema, right eye: Secondary | ICD-10-CM

## 2016-05-13 DIAGNOSIS — H43813 Vitreous degeneration, bilateral: Secondary | ICD-10-CM | POA: Diagnosis not present

## 2016-05-13 DIAGNOSIS — E11311 Type 2 diabetes mellitus with unspecified diabetic retinopathy with macular edema: Secondary | ICD-10-CM

## 2016-05-13 DIAGNOSIS — H35033 Hypertensive retinopathy, bilateral: Secondary | ICD-10-CM | POA: Diagnosis not present

## 2016-05-13 LAB — MICROALBUMIN, URINE: MICROALB UR: 3.7 ug/mL — AB

## 2016-05-13 LAB — HEMOGLOBIN A1C
Hgb A1c MFr Bld: 7 % — ABNORMAL HIGH (ref 4.8–5.6)
Mean Plasma Glucose: 154 mg/dL

## 2016-06-03 ENCOUNTER — Other Ambulatory Visit (INDEPENDENT_AMBULATORY_CARE_PROVIDER_SITE_OTHER): Payer: Medicare Other | Admitting: Ophthalmology

## 2016-06-03 DIAGNOSIS — H26492 Other secondary cataract, left eye: Secondary | ICD-10-CM

## 2016-06-08 ENCOUNTER — Other Ambulatory Visit: Payer: Self-pay | Admitting: *Deleted

## 2016-06-08 MED ORDER — LORAZEPAM 0.5 MG PO TABS: ORAL_TABLET | ORAL | 0 refills | Status: DC

## 2016-06-08 NOTE — Telephone Encounter (Signed)
[facility] #[phone] Fax: [phone]   

## 2016-06-09 ENCOUNTER — Encounter: Payer: Self-pay | Admitting: Internal Medicine

## 2016-06-09 ENCOUNTER — Non-Acute Institutional Stay (SKILLED_NURSING_FACILITY): Payer: Medicare Other | Admitting: Internal Medicine

## 2016-06-09 DIAGNOSIS — E1165 Type 2 diabetes mellitus with hyperglycemia: Secondary | ICD-10-CM

## 2016-06-09 DIAGNOSIS — Z794 Long term (current) use of insulin: Secondary | ICD-10-CM | POA: Diagnosis not present

## 2016-06-09 DIAGNOSIS — I4891 Unspecified atrial fibrillation: Secondary | ICD-10-CM | POA: Diagnosis not present

## 2016-06-09 DIAGNOSIS — I878 Other specified disorders of veins: Secondary | ICD-10-CM

## 2016-06-09 DIAGNOSIS — E1142 Type 2 diabetes mellitus with diabetic polyneuropathy: Secondary | ICD-10-CM | POA: Diagnosis not present

## 2016-06-09 DIAGNOSIS — F0391 Unspecified dementia with behavioral disturbance: Secondary | ICD-10-CM | POA: Diagnosis not present

## 2016-06-09 DIAGNOSIS — E785 Hyperlipidemia, unspecified: Secondary | ICD-10-CM

## 2016-06-09 DIAGNOSIS — IMO0002 Reserved for concepts with insufficient information to code with codable children: Secondary | ICD-10-CM

## 2016-06-09 NOTE — Progress Notes (Signed)
Location:   [place] [facility] Room Number: 113/W Place of Service:  SNF [phone]) Provider:  Dionicia Abler, MD  Patient Care Team: Celedonio Savage, MD as PCP - General (Family Medicine) Yehuda Savannah, MD (Cardiology) Lendon Colonel, NP as Nurse Practitioner (Nurse Practitioner)  Extended Emergency Contact Information Primary Emergency Contact: [facility] Address: [address]          [place], [place] [address] [name] of [place] Phone: [phone] Mobile Phone: [phone] Relation: Daughter  Code Status:  DNR Goals of care: Advanced Directive information Advanced Directives 06/09/2016  Does Patient Have a Medical Advance Directive? Yes  Type of Advance Directive Out of facility DNR (pink MOST or yellow form)  Does patient want to make changes to medical advance directive? No - Patient declined  Copy of [place] in Chart? -     Chief Complaint  Patient presents with  . Medical Management of Chronic Issues    Routine Visit  For medical management of chronic medical conditions including dementia-diabetes-atrial fibrillation-hemostasis-depression-hyperlipidemia-osteoporosis-  HPI:  Pt is a 76 y.o. female seen today for medical management of chronic medical conditions including above conditions.  She continues to have quite a period of stability here-she is eating and drinking well appears to be doing quite well with supportive care.  Her atrial fibrillation appears rate controlled she is on digoxin Lopressor as well as Cymbalta.  Most acute issue recently has been some lower blood sugars in fact her blood sugar was 46 at at bedtime last night-sugars appear to be somewhat lower at times in the later day parts-blood sugars at at bedtime have ranged from 46 last night the 90s to low 100s-morning sugars appear to be somewhat more stable largely in the 80s to lower 100s.  Meantime blood sugars run more from the mid 100s  to occasionally over 200.  At 4:30 sugars are somewhat variable ranging from the 80s to mid 100s generally and at at bedtime again low sugars are present including 46 last night I do see at times readings in the 70s 90s as well although there are ones in the mid 100s as well.  She is on Humulin 70/30-30-currently 52 units every morning--and 40 units at 4:30 PM.  She is also Glucophage twice a day  Regards to other issues she is very well if scales are to be believe she can about 6 pounds over the past couple months she is awaiting a reweigh there may be some scale variation here --she does have chronic venous stasis changes edema and has been stable on Lasix 20 mg daily at.    Regards to dementia this appears to be well controlled with supportive care she is on Namenda.  She also has a history of anxiety continues on Ativan when necessary she also has a history of depression which has really not been symptomatic here recently she is on Celexa At one point in her stay she did sustain an ocular floor fracture but this appears to have healed  Currently she is sitting in her wheelchair comfortably continues to be pleasant smiling in good humor.     Past Medical History:  Diagnosis Date  . Allergy   . Atrial fibrillation ([place]) 08/2011   First diagnosed in 08/2011; duration of arrhythmia is uncertain  . Bilateral lower extremity edema   . Chronic diarrhea    diverticulosis  . COPD (chronic obstructive pulmonary disease) ([place])   . Dementia   .  Diabetes mellitus, type 2 (HCC)    Diabetic neuropathy  . Dyspnea on exertion    pedal edema  . Gout   . Headache(784.0)    twice weekly  . Hyperlipidemia   . Hypertension   . Osteopenia    DEXA scan 01/2010  . Palpitations   . Seasonal allergies   . Stress incontinence   . Vertigo    Past Surgical History:  Procedure Laterality Date  . APPENDECTOMY    . BREAST BIOPSY  2002   Right  . CESAREAN SECTION     x 2  . CHOLECYSTECTOMY      . COLONOSCOPY  2007   Negative screening study  . HAMMER TOE SURGERY     Bilateral hammer toe amputation  . INCISIONAL HERNIA REPAIR    . KNEE ARTHROSCOPY W/ MENISCAL REPAIR  2007   Bilateral  . UMBILICAL HERNIA REPAIR      Allergies  Allergen Reactions  . Codeine Anaphylaxis and Hives  . Morphine And Related Anaphylaxis and Hives  . Penicillins Anaphylaxis  . Ace Inhibitors Cough   Current Outpatient Prescriptions on File Prior to Visit  Medication Sig Dispense Refill  . acetaminophen (TYLENOL) 325 MG tablet Take 650 mg by mouth every 6 (six) hours as needed.    . carboxymethylcellulose (REFRESH PLUS) 0.5 % SOLN Place 1 drop into both eyes 4 (four) times daily.    . cetirizine (ZYRTEC) 10 MG tablet Take 1 tablet (10 mg total) by mouth daily. 90 tablet 3  . cholecalciferol (VITAMIN D) 1000 UNITS tablet Take 1,000 Units by mouth daily.    . citalopram (CELEXA) 20 MG tablet Take 1 tablet (20 mg total) by mouth daily. 90 tablet 3  . digoxin (LANOXIN) 0.125 MG tablet Take 1 tablet by mouth once a day (HOLD FOR AP UNDER 60)    . docusate sodium (COLACE) 100 MG capsule Take 200 mg by mouth daily as needed.     . furosemide (LASIX) 20 MG tablet Take 20 mg by mouth.    . guaifenesin (HUMIBID E) 400 MG TABS tablet Take 400 mg by mouth every 6 (six) hours as needed.    . Insulin Isophane & Regular Human (HUMULIN 70/30 KWIKPEN) (70-30) 100 UNIT/ML PEN Give 52 units subcutaneous in mornings. Give 40 units in the evening.    . Insulin Pen Needle (EASY TOUCH PEN NEEDLES) 31G X 8 MM MISC Use twice a day    . LORazepam (ATIVAN) 0.5 MG tablet Take one tablet by mouth every 8 hours as needed for anxiety 90 tablet 0  . memantine (NAMENDA XR) 14 MG CP24 24 hr capsule Take 14 mg by mouth daily.    . metFORMIN (GLUCOPHAGE) 500 MG tablet Take 500 mg by mouth 2 (two) times daily with a meal.     . metoprolol (LOPRESSOR) 100 MG tablet Take 1 tablet (100 mg total) by mouth 2 (two) times daily. 180 tablet 3   . Multiple Vitamin (MULTIVITAMIN WITH MINERALS) TABS Take 1 tablet by mouth every morning.    Marland Kitchen POTASSIUM CHLORIDE ER PO Potassium chloride extended release capsule, give 10 meq by mouth once day    . pravastatin (PRAVACHOL) 40 MG tablet Take 1 tablet (40 mg total) by mouth every evening. 90 tablet 3  . Probiotic Product (RISA-BID PROBIOTIC PO) Take 1 tablet by mouth twice a day    . traZODone (DESYREL) 50 MG tablet Take 50 mg by mouth at bedtime. As needed for sleep.    Marland Kitchen  XARELTO 20 MG TABS tablet TAKE (1) TABLET BY MOUTH ONCE DAILY. 30 tablet 2  . [DISCONTINUED] K-DUR 20 MEQ tablet TAKE 2 TABLETS BY MOUTH ONCE DAILY. 60 tablet 5   No current facility-administered medications on file prior to visit.     Review of Systems    Limited secondary to dementia but nursing staff has not reported any issues she is eating and drinking well-does not complain of any shortness of breath cough or difficulty breathing-blood sugars again have been somewhat low later in the day as noted above she does not appear to be symptomatic,  Immunization History  Administered Date(s) Administered  . Influenza-Unspecified 02/04/2016  . Pneumococcal-Unspecified 02/08/2016   Pertinent  Health Maintenance Due  Topic Date Due  . FOOT EXAM  11/29/2016 (Originally 03/18/1951)  . OPHTHALMOLOGY EXAM  11/29/2016 (Originally 03/18/1951)  . DEXA SCAN  11/29/2016 (Originally 03/18/2005)  . HEMOGLOBIN A1C  11/09/2016  . PNA vac Low Risk Adult (2 of 2 - PCV13) 02/07/2017  . URINE MICROALBUMIN  05/11/2017  . INFLUENZA VACCINE  Completed   Fall Risk  11/06/2015 09/29/2015 08/31/2015 06/30/2015 02/24/2015  Falls in the past year? Yes No No No Yes  Number falls in past yr: 2 or more - - - 2 or more  Injury with Fall? No - - - No   Functional Status Survey:    Vitals:   06/09/16 1150  BP: 138/66  Pulse: 71  Resp: 16  Temp: 98.2 F (36.8 C)  TempSrc: Oral  Weight in January was 199 updated weight is pending  Physical  Exam  In general this is a very pleasant elderly female in no distress sitting comfortably in her wheelchair.  Her skin is warm and dry.  Eyes sclera and conjunctiva are clear visual acuity appears grossly intact she has prescription lenses.  Chest is clear to auscultation there is no labored breathing.  Heart is irregular irregular rate and rhythm without murmur gallop or rub she has chronic venous stasis changes edema which appears relatively baseline.  Abdomen is obese soft nontender with positive bowel sounds.  Muscle skeletal she does have venous stasis changes bilaterally is able to move all extremities 4 strength appears preserved upper extremities bilaterally has some lower extremity weakness which is not new complicated somewhat with her significant venous stasis.  Neurologic is grossly intact her speech is clear no lateralizing findings cranial nerves appear grossly intact.  Psych she is pleasant and appropriate oriented to self follow simple verbal commands without difficulty continues to be pleasant and engaged although confused   Labs reviewed:  Recent Labs  01/15/16 0710 03/15/16 0500 05/12/16 0500  NA 139 137 138  K 4.0 4.2 4.2  CL 105 103 104  CO2 27 26 29   GLUCOSE 196* 196* 116*  BUN 20 24* 26*  CREATININE 0.83 0.93 0.91  CALCIUM 9.1 9.2 9.0    Recent Labs  06/30/15 1712 01/15/16 0710 05/12/16 0500  AST 15 20 13*  ALT 13 16 13*  ALKPHOS 58 61 49  BILITOT 0.2 0.6 0.7  PROT 7.3 7.6 6.6  ALBUMIN 3.5 3.3* 2.9*    Recent Labs  12/01/15 0734 01/15/16 0500 03/15/16 0500 05/12/16 0500  WBC 7.5 7.1 7.5 6.5  NEUTROABS 4.9 4.0  --   --   HGB 12.0 14.0 13.8 12.0  HCT 37.4 44.3 42.1  40.1 37.3  MCV 92.3 94.7 92.3 92.6  PLT 300 194 214 224   Lab Results  Component Value Date  TSH 2.936 09/09/2011   Lab Results  Component Value Date   HGBA1C 7.0 (H) 05/12/2016   Lab Results  Component Value Date   CHOL 129 05/12/2016   HDL 35 (L)  05/12/2016   LDLCALC 79 05/12/2016   TRIG 73 05/12/2016   CHOLHDL 3.7 05/12/2016    Significant Diagnostic Results in last 30 days:  No results found.  Assessment/Plan #1-history diabetes type 2-as noted above she has some lower blood sugars and later day parts this was discussed with Dr. Lyndel Safe via phone will decrease her Humulin 70-30 down to 30 units every 4 p.m. at this point continue 52 units every morning and continue to monitor clinically she appears stable in this regards her hemoglobin A1c in January was 7.0  Of note she is also on Glucophage twice a day  No sliding scalecall provider if CBG greater than 300.  #2-fibrillation this appears rate controlled on Lopressor and digoxin she is on Xarelto for anticoagulation will update a digoxin level.  #3 dementia this appears quite stable with supportive care she is on Namenda X are.  #4-history of venous stasis this appears relatively baseline she is on Lasix-will update a metabolic panel to ensure stability.  #5 history of osteoporosis DEXA scan was concerning for this Dr. Lyndel Safe dizziness evaluated this and she has been started on Fosamax No. 6-history of hyperlipidemia she is on Pravachol LDL was 79 on lab done last month in January liver function tests appear to be fairly unremarkable.  #6 history of chronic allergies-she is on Zyrtec this is been stable for some time.  #7 history of anemia hemoglobin appears to be stable at 12.0 on lab done last month in January.  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  [id number] note greater than 40 minutes spent assessing  patient-reviewing her chart-reviewing her labs-and coordinating and formulating a plan of care for numerous diagnoses-of note greater than 50% of time spent coordinating plan of care      Oralia Manis, [place]

## 2016-06-10 ENCOUNTER — Encounter (INDEPENDENT_AMBULATORY_CARE_PROVIDER_SITE_OTHER): Payer: Medicare Other | Admitting: Ophthalmology

## 2016-06-10 ENCOUNTER — Encounter (HOSPITAL_COMMUNITY)
Admission: RE | Admit: 2016-06-10 | Discharge: 2016-06-10 | Disposition: A | Discharge status: Home / Self Care | Payer: Medicare Other | Source: Skilled Nursing Facility | Attending: Internal Medicine | Admitting: Internal Medicine

## 2016-06-10 DIAGNOSIS — G4709 Other insomnia: Secondary | ICD-10-CM | POA: Insufficient documentation

## 2016-06-10 DIAGNOSIS — M810 Age-related osteoporosis without current pathological fracture: Secondary | ICD-10-CM | POA: Diagnosis present

## 2016-06-10 DIAGNOSIS — H348312 Tributary (branch) retinal vein occlusion, right eye, stable: Secondary | ICD-10-CM | POA: Diagnosis not present

## 2016-06-10 DIAGNOSIS — H34832 Tributary (branch) retinal vein occlusion, left eye, with macular edema: Secondary | ICD-10-CM | POA: Diagnosis not present

## 2016-06-10 DIAGNOSIS — I5042 Chronic combined systolic (congestive) and diastolic (congestive) heart failure: Secondary | ICD-10-CM | POA: Insufficient documentation

## 2016-06-10 DIAGNOSIS — I10 Essential (primary) hypertension: Secondary | ICD-10-CM | POA: Diagnosis not present

## 2016-06-10 DIAGNOSIS — H43813 Vitreous degeneration, bilateral: Secondary | ICD-10-CM

## 2016-06-10 DIAGNOSIS — E11311 Type 2 diabetes mellitus with unspecified diabetic retinopathy with macular edema: Secondary | ICD-10-CM

## 2016-06-10 DIAGNOSIS — F3289 Other specified depressive episodes: Secondary | ICD-10-CM | POA: Diagnosis present

## 2016-06-10 DIAGNOSIS — E113312 Type 2 diabetes mellitus with moderate nonproliferative diabetic retinopathy with macular edema, left eye: Secondary | ICD-10-CM

## 2016-06-10 DIAGNOSIS — E113391 Type 2 diabetes mellitus with moderate nonproliferative diabetic retinopathy without macular edema, right eye: Secondary | ICD-10-CM | POA: Diagnosis not present

## 2016-06-10 DIAGNOSIS — H35033 Hypertensive retinopathy, bilateral: Secondary | ICD-10-CM | POA: Diagnosis not present

## 2016-06-10 LAB — DIGOXIN LEVEL: Digoxin Level: 0.7 ng/mL — ABNORMAL LOW (ref 0.8–2.0)

## 2016-06-10 LAB — BASIC METABOLIC PANEL
Anion gap: 5 (ref 5–15)
BUN: 25 mg/dL — ABNORMAL HIGH (ref 6–20)
CO2: 30 mmol/L (ref 22–32)
Calcium: 8.7 mg/dL — ABNORMAL LOW (ref 8.9–10.3)
Chloride: 105 mmol/L (ref 101–111)
Creatinine, Ser: 0.95 mg/dL (ref 0.44–1.00)
GFR calc Af Amer: >60 mL/min (ref >60)
GFR calc non Af Amer: 57 mL/min — ABNORMAL LOW (ref >60)
GLUCOSE: 111 mg/dL — AB (ref 65–99)
POTASSIUM: 4.3 mmol/L (ref 3.5–5.1)
Sodium: 140 mmol/L (ref 135–145)

## 2016-07-01 ENCOUNTER — Encounter (INDEPENDENT_AMBULATORY_CARE_PROVIDER_SITE_OTHER): Payer: Medicare Other | Admitting: Ophthalmology

## 2016-07-01 DIAGNOSIS — H35033 Hypertensive retinopathy, bilateral: Secondary | ICD-10-CM

## 2016-07-01 DIAGNOSIS — E113313 Type 2 diabetes mellitus with moderate nonproliferative diabetic retinopathy with macular edema, bilateral: Secondary | ICD-10-CM

## 2016-07-01 DIAGNOSIS — E11311 Type 2 diabetes mellitus with unspecified diabetic retinopathy with macular edema: Secondary | ICD-10-CM | POA: Diagnosis not present

## 2016-07-01 DIAGNOSIS — I10 Essential (primary) hypertension: Secondary | ICD-10-CM | POA: Diagnosis not present

## 2016-07-01 DIAGNOSIS — H34833 Tributary (branch) retinal vein occlusion, bilateral, with macular edema: Secondary | ICD-10-CM | POA: Diagnosis not present

## 2016-07-01 DIAGNOSIS — H43813 Vitreous degeneration, bilateral: Secondary | ICD-10-CM

## 2016-08-09 ENCOUNTER — Non-Acute Institutional Stay (SKILLED_NURSING_FACILITY): Payer: Medicare Other | Admitting: Internal Medicine

## 2016-08-09 ENCOUNTER — Encounter: Payer: Self-pay | Admitting: Internal Medicine

## 2016-08-09 DIAGNOSIS — E1165 Type 2 diabetes mellitus with hyperglycemia: Secondary | ICD-10-CM | POA: Diagnosis not present

## 2016-08-09 DIAGNOSIS — F0391 Unspecified dementia with behavioral disturbance: Secondary | ICD-10-CM

## 2016-08-09 DIAGNOSIS — I4891 Unspecified atrial fibrillation: Secondary | ICD-10-CM

## 2016-08-09 DIAGNOSIS — I10 Essential (primary) hypertension: Secondary | ICD-10-CM | POA: Diagnosis not present

## 2016-08-09 DIAGNOSIS — IMO0002 Reserved for concepts with insufficient information to code with codable children: Secondary | ICD-10-CM

## 2016-08-09 DIAGNOSIS — E1142 Type 2 diabetes mellitus with diabetic polyneuropathy: Secondary | ICD-10-CM | POA: Diagnosis not present

## 2016-08-09 DIAGNOSIS — Z794 Long term (current) use of insulin: Secondary | ICD-10-CM

## 2016-08-09 DIAGNOSIS — M810 Age-related osteoporosis without current pathological fracture: Secondary | ICD-10-CM | POA: Insufficient documentation

## 2016-08-09 NOTE — Progress Notes (Signed)
Location:   [place] Room Number: 113/W Place of Service:  SNF (31) Provider:  Jefm Bryant, MD  Patient Care Team: Celedonio Savage, MD as PCP - General (Family Medicine) Yehuda Savannah, MD (Cardiology) Lendon Colonel, NP as Nurse Practitioner (Nurse Practitioner)  Extended Emergency Contact Information Primary Emergency Contact: [facility] Address: [address]          [place], [place] [address] Christine [name] Phone: [phone] Mobile Phone: [phone] Relation: Daughter  Code Status:  DNR Goals of care: Advanced Directive information Advanced Directives 08/09/2016  Does Patient Have a Medical Advance Directive? Yes  Type of Advance Directive Out of facility DNR (pink MOST or yellow form)  Does patient want to make changes to medical advance directive? No - Patient declined  Copy of [place] in Chart? -     Chief Complaint  Patient presents with  . Medical Management of Chronic Issues    Routine Visit    HPI:  Pt is a 76 y.o. female seen today for medical management of chronic diseases.    Patient has h/o Diabetes mellitus type 2 , atrial fibrillation on chronic anticoagulation, Venous stasis, Dementia with Behavior problems, S/P fall leading to Ocular floor fracture.   Her recent problem has been some low BS requiring adjusting of her Insulin. Since last adjustment her BS are running well and staying mostly less then 200. She does have one or 2 lows of 70 but otherwise stable. Patient do not have any complains. Per Nurses she is doing well. Her weight is stable at 200 lbs. She is sitting in her wheel chair and stays mostly in her room. Has not had any behavior problems.  Past Medical History:  Diagnosis Date  . Allergy   . Atrial fibrillation ([place]) 08/2011   First diagnosed in 08/2011; duration of arrhythmia is uncertain  . Bilateral lower extremity edema   . Chronic diarrhea    diverticulosis  . COPD (chronic obstructive pulmonary disease) ([place])   . Dementia   . Diabetes mellitus, type 2 (HCC)    Diabetic neuropathy  . Dyspnea on exertion    pedal edema  . Gout   . Headache(784.0)    twice weekly  . Hyperlipidemia   . Hypertension   . Osteopenia    DEXA scan 01/2010  . Palpitations   . Seasonal allergies   . Stress incontinence   . Vertigo    Past Surgical History:  Procedure Laterality Date  . APPENDECTOMY    . BREAST BIOPSY  2002   Right  . CESAREAN SECTION     x 2  . CHOLECYSTECTOMY    . COLONOSCOPY  2007   Negative screening study  . HAMMER TOE SURGERY     Bilateral hammer toe amputation  . INCISIONAL HERNIA REPAIR    . KNEE ARTHROSCOPY W/ MENISCAL REPAIR  2007   Bilateral  . UMBILICAL HERNIA REPAIR      Allergies  Allergen Reactions  . Codeine Anaphylaxis and Hives  . Morphine And Related Anaphylaxis and Hives  . Penicillins Anaphylaxis  . Ace Inhibitors Cough    Current Outpatient Prescriptions on File Prior to Visit  Medication Sig Dispense Refill  . acetaminophen (TYLENOL) 325 MG tablet Take 650 mg by mouth every 6 (six) hours as needed.    Marland Kitchen alendronate (FOSAMAX) 70 MG tablet Give 1 tablet by mouth on Tuesday. Take with a full glass of water on an  empty stomach.    . carboxymethylcellulose (REFRESH PLUS) 0.5 % SOLN Place 1 drop into both eyes 4 (four) times daily.    . cetirizine (ZYRTEC) 10 MG tablet Take 1 tablet (10 mg total) by mouth daily. 90 tablet 3  . cholecalciferol (VITAMIN D) 1000 UNITS tablet Take 1,000 Units by mouth daily.    . citalopram (CELEXA) 20 MG tablet Take 1 tablet (20 mg total) by mouth daily. 90 tablet 3  . digoxin (LANOXIN) 0.125 MG tablet Take 1 tablet by mouth once a day (HOLD FOR AP UNDER 60)    . docusate sodium (COLACE) 100 MG capsule Take 200 mg by mouth daily as needed.     . furosemide (LASIX) 20 MG tablet Take 20 mg by mouth.    . guaifenesin (HUMIBID E) 400 MG TABS tablet Take 400 mg by  mouth every 6 (six) hours as needed.    . Insulin Isophane & Regular Human (HUMULIN 70/30 KWIKPEN) (70-30) 100 UNIT/ML PEN Give 52 units subcutaneous in mornings. Give 30  units in the evening.    . Insulin Pen Needle (EASY TOUCH PEN NEEDLES) 31G X 8 MM MISC Use twice a day    . memantine (NAMENDA XR) 14 MG CP24 24 hr capsule Take 14 mg by mouth daily.    . metFORMIN (GLUCOPHAGE) 500 MG tablet Take 500 mg by mouth 2 (two) times daily with a meal.     . metoprolol (LOPRESSOR) 100 MG tablet Take 1 tablet (100 mg total) by mouth 2 (two) times daily. 180 tablet 3  . Multiple Vitamin (MULTIVITAMIN WITH MINERALS) TABS Take 1 tablet by mouth every morning.    Marland Kitchen POTASSIUM CHLORIDE ER PO Potassium chloride extended release capsule, give 10 meq by mouth once day    . pravastatin (PRAVACHOL) 40 MG tablet Take 1 tablet (40 mg total) by mouth every evening. 90 tablet 3  . Probiotic Product (RISA-BID PROBIOTIC PO) Take 1 tablet by mouth twice a day    . traZODone (DESYREL) 50 MG tablet Take 50 mg by mouth at bedtime. As needed for sleep.    Alveda Reasons 20 MG TABS tablet TAKE (1) TABLET BY MOUTH ONCE DAILY. 30 tablet 2  . [DISCONTINUED] K-DUR 20 MEQ tablet TAKE 2 TABLETS BY MOUTH ONCE DAILY. 60 tablet 5   No current facility-administered medications on file prior to visit.      Review of Systems  Unable to perform ROS: Dementia (Patient denies everything.)    Immunization History  Administered Date(s) Administered  . Influenza-Unspecified 02/04/2016  . Pneumococcal-Unspecified 02/08/2016   Pertinent  Health Maintenance Due  Topic Date Due  . FOOT EXAM  11/29/2016 (Originally 03/18/1951)  . OPHTHALMOLOGY EXAM  11/29/2016 (Originally 03/18/1951)  . DEXA SCAN  11/29/2016 (Originally 03/18/2005)  . HEMOGLOBIN A1C  11/09/2016  . INFLUENZA VACCINE  11/30/2016  . PNA vac Low Risk Adult (2 of 2 - PCV13) 02/07/2017  . URINE MICROALBUMIN  05/11/2017   Fall Risk  11/06/2015 09/29/2015 08/31/2015 06/30/2015  02/24/2015  Falls in the past year? Yes No No No Yes  Number falls in past yr: 2 or more - - - 2 or more  Injury with Fall? No - - - No   Functional Status Survey:    Vitals:   08/09/16 1417  BP: 116/79  Pulse: 83  Resp: 20  Temp: 97.2 F (36.2 C)  TempSrc: Oral   There is no height or weight on file to calculate BMI. Physical Exam  Constitutional: She appears well-developed and well-nourished.  HENT:  Head: Normocephalic.  Mouth/Throat: Oropharynx is clear and moist.  Eyes: Pupils are equal, round, and reactive to light.  Neck: Neck supple.  Cardiovascular: Normal rate and normal heart sounds.  An irregular rhythm present.  Pulmonary/Chest: Effort normal and breath sounds normal. No respiratory distress. She has no wheezes. She has no rales.  Abdominal: Soft. Bowel sounds are normal. She exhibits no distension. There is no tenderness. There is no rebound.  Musculoskeletal:  Mild edema with Chronic stasis  Neurological: She is alert.  Not oriented to time or place.  Skin: Skin is warm and dry.  Psychiatric: She has a normal mood and affect. Her behavior is normal.    Labs reviewed:  Recent Labs  03/15/16 0500 05/12/16 0500 06/10/16 0700  NA 137 138 140  K 4.2 4.2 4.3  CL 103 104 105  CO2 26 29 30   GLUCOSE 196* 116* 111*  BUN 24* 26* 25*  CREATININE 0.93 0.91 0.95  CALCIUM 9.2 9.0 8.7*    Recent Labs  01/15/16 0710 05/12/16 0500  AST 20 13*  ALT 16 13*  ALKPHOS 61 49  BILITOT 0.6 0.7  PROT 7.6 6.6  ALBUMIN 3.3* 2.9*    Recent Labs  12/01/15 0734 01/15/16 0500 03/15/16 0500 05/12/16 0500  WBC 7.5 7.1 7.5 6.5  NEUTROABS 4.9 4.0  --   --   HGB 12.0 14.0 13.8 12.0  HCT 37.4 44.3 42.1  40.1 37.3  MCV 92.3 94.7 92.3 92.6  PLT 300 194 214 224   Lab Results  Component Value Date   TSH 2.936 09/09/2011   Lab Results  Component Value Date   HGBA1C 7.0 (H) 05/12/2016   Lab Results  Component Value Date   CHOL 129 05/12/2016   HDL 35 (L)  05/12/2016   LDLCALC 79 05/12/2016   TRIG 73 05/12/2016   CHOLHDL 3.7 05/12/2016    Significant Diagnostic Results in last 30 days:  No results found.  Assessment/Plan Atrial fibrillation with rapid ventricular response  Doing well on On Lopressor, Digoxin and Xarelto. No recent falls or Injuries reported.  Essential hypertension BP well controlled  Diabetes mellitus Patient last A1C was 7 Her BS are well controlled. She did have microalbuminuria Will start her on Losartan low dose. ACE inhibitor seems to cause Cough in her. Repeat Labs in 2 weeks.BMP and A1C Vitals Q Day to follow BP.     Dementia with behavioral disturbance,  Doing well on Namenda and celexa   osteoporosis  She was started on Fosamax and is doing well. Hyperlipidemia LDL less then 100. On Pravachol.  Family/ staff Communication:   Labs/tests ordered:  A1C and BMP in 2 weeks.

## 2016-08-12 ENCOUNTER — Encounter (INDEPENDENT_AMBULATORY_CARE_PROVIDER_SITE_OTHER): Payer: Medicare Other | Admitting: Ophthalmology

## 2016-08-12 DIAGNOSIS — H43813 Vitreous degeneration, bilateral: Secondary | ICD-10-CM | POA: Diagnosis not present

## 2016-08-12 DIAGNOSIS — I10 Essential (primary) hypertension: Secondary | ICD-10-CM | POA: Diagnosis not present

## 2016-08-12 DIAGNOSIS — H35033 Hypertensive retinopathy, bilateral: Secondary | ICD-10-CM | POA: Diagnosis not present

## 2016-08-12 DIAGNOSIS — E113313 Type 2 diabetes mellitus with moderate nonproliferative diabetic retinopathy with macular edema, bilateral: Secondary | ICD-10-CM | POA: Diagnosis not present

## 2016-08-12 DIAGNOSIS — H34832 Tributary (branch) retinal vein occlusion, left eye, with macular edema: Secondary | ICD-10-CM

## 2016-08-12 DIAGNOSIS — E11311 Type 2 diabetes mellitus with unspecified diabetic retinopathy with macular edema: Secondary | ICD-10-CM | POA: Diagnosis not present

## 2016-08-23 ENCOUNTER — Encounter (HOSPITAL_COMMUNITY)
Admission: RE | Admit: 2016-08-23 | Discharge: 2016-08-23 | Disposition: A | Discharge status: Home / Self Care | Payer: Medicare Other | Source: Skilled Nursing Facility | Attending: Internal Medicine | Admitting: Internal Medicine

## 2016-08-23 DIAGNOSIS — M810 Age-related osteoporosis without current pathological fracture: Secondary | ICD-10-CM | POA: Insufficient documentation

## 2016-08-23 DIAGNOSIS — I5042 Chronic combined systolic (congestive) and diastolic (congestive) heart failure: Secondary | ICD-10-CM | POA: Diagnosis not present

## 2016-08-23 DIAGNOSIS — F3289 Other specified depressive episodes: Secondary | ICD-10-CM | POA: Insufficient documentation

## 2016-08-23 DIAGNOSIS — G4709 Other insomnia: Secondary | ICD-10-CM | POA: Diagnosis not present

## 2016-08-23 LAB — BASIC METABOLIC PANEL
ANION GAP: 7 (ref 5–15)
BUN: 22 mg/dL — ABNORMAL HIGH (ref 6–20)
CALCIUM: 9.3 mg/dL (ref 8.9–10.3)
CO2: 30 mmol/L (ref 22–32)
Chloride: 102 mmol/L (ref 101–111)
Creatinine, Ser: 0.89 mg/dL (ref 0.44–1.00)
GFR calc Af Amer: >60 mL/min (ref >60)
GFR calc non Af Amer: >60 mL/min (ref >60)
GLUCOSE: 99 mg/dL (ref 65–99)
Potassium: 4.3 mmol/L (ref 3.5–5.1)
Sodium: 139 mmol/L (ref 135–145)

## 2016-08-23 LAB — CBC
HEMATOCRIT: 43 % (ref 36.0–46.0)
Hemoglobin: 13.7 g/dL (ref 12.0–15.0)
MCH: 29.4 pg (ref 26.0–34.0)
MCHC: 31.9 g/dL (ref 30.0–36.0)
MCV: 92.3 fL (ref 78.0–100.0)
Platelets: 234 10*3/uL (ref 150–400)
RBC: 4.66 MIL/uL (ref 3.87–5.11)
RDW: 13.8 % (ref 11.5–15.5)
WBC: 7.9 10*3/uL (ref 4.0–10.5)

## 2016-08-24 LAB — HEMOGLOBIN A1C
Hgb A1c MFr Bld: 6.5 % — ABNORMAL HIGH (ref 4.8–5.6)
Mean Plasma Glucose: 140 mg/dL

## 2016-08-29 DIAGNOSIS — F419 Anxiety disorder, unspecified: Secondary | ICD-10-CM | POA: Diagnosis not present

## 2016-08-29 DIAGNOSIS — G4700 Insomnia, unspecified: Secondary | ICD-10-CM | POA: Diagnosis not present

## 2016-08-29 DIAGNOSIS — F0391 Unspecified dementia with behavioral disturbance: Secondary | ICD-10-CM | POA: Diagnosis not present

## 2016-08-29 DIAGNOSIS — F329 Major depressive disorder, single episode, unspecified: Secondary | ICD-10-CM | POA: Diagnosis not present

## 2016-09-06 DIAGNOSIS — D688 Other specified coagulation defects: Secondary | ICD-10-CM | POA: Diagnosis not present

## 2016-09-06 DIAGNOSIS — I70203 Unspecified atherosclerosis of native arteries of extremities, bilateral legs: Secondary | ICD-10-CM | POA: Diagnosis not present

## 2016-09-06 DIAGNOSIS — B351 Tinea unguium: Secondary | ICD-10-CM | POA: Diagnosis not present

## 2016-09-06 DIAGNOSIS — E1159 Type 2 diabetes mellitus with other circulatory complications: Secondary | ICD-10-CM | POA: Diagnosis not present

## 2016-09-07 ENCOUNTER — Non-Acute Institutional Stay (SKILLED_NURSING_FACILITY): Payer: Medicare Other | Admitting: Internal Medicine

## 2016-09-07 ENCOUNTER — Encounter: Payer: Self-pay | Admitting: Internal Medicine

## 2016-09-07 DIAGNOSIS — E1165 Type 2 diabetes mellitus with hyperglycemia: Secondary | ICD-10-CM

## 2016-09-07 DIAGNOSIS — I4891 Unspecified atrial fibrillation: Secondary | ICD-10-CM

## 2016-09-07 DIAGNOSIS — E1142 Type 2 diabetes mellitus with diabetic polyneuropathy: Secondary | ICD-10-CM | POA: Diagnosis not present

## 2016-09-07 DIAGNOSIS — IMO0002 Reserved for concepts with insufficient information to code with codable children: Secondary | ICD-10-CM

## 2016-09-07 DIAGNOSIS — I878 Other specified disorders of veins: Secondary | ICD-10-CM | POA: Diagnosis not present

## 2016-09-07 DIAGNOSIS — F0391 Unspecified dementia with behavioral disturbance: Secondary | ICD-10-CM | POA: Diagnosis not present

## 2016-09-07 DIAGNOSIS — Z794 Long term (current) use of insulin: Secondary | ICD-10-CM | POA: Diagnosis not present

## 2016-09-07 DIAGNOSIS — M810 Age-related osteoporosis without current pathological fracture: Secondary | ICD-10-CM

## 2016-09-07 NOTE — Progress Notes (Signed)
Location:   [facility] Room Number: 113/W Place of Service:  SNF (31) Provider:  Dionicia Abler, MD  Patient Care Team: Celedonio Savage, MD as PCP - General (Family Medicine) Rothbart, Cristopher Estimable, MD (Cardiology) Lendon Colonel, NP as Nurse Practitioner (Nurse Practitioner)  Extended Emergency Contact Information Primary Emergency Contact: [facility] Address: [address]          [place], [place] [address] [name] of [place] Phone: [phone] Mobile Phone: [phone] Relation: Daughter  Code Status:  DNR Goals of care: Advanced Directive information Advanced Directives 09/07/2016  Does Patient Have a Medical Advance Directive? Yes  Type of Advance Directive Out of facility DNR (pink MOST or yellow form)  Does patient want to make changes to medical advance directive? No - Patient declined  Copy of [place] in Chart? -     Chief Complaint  Patient presents with  . Medical Management of Chronic Issues    Routine Visit  Medical management of chronic medical issues including diabetes type 2-atrial fibrillation on chronic anticoagulation-venous stasis-dementia with behaviors-history of ocular floor fracture.    HPI:  Pt is a 76 y.o. female seen today for medical management of chronic diseases.  She has been quite stable since her last routine visit last month.  Point She Was Experiencing Low Blood Sugars with Insulin Has Been Adjusted by Dr. Jessica Priest Is Currently on Humulin 70/30 52 Units in the Morning and 30 Units at Night and This Appears to Be Fairly Stable Morning Blood Sugars Appear to Run Largely in the 90s to Low 100s. She is also on Glucophage 500 mg twice a day  Midday Blood Sugars Appear to Be Largely in the Mid 100s-Somewhat More Variable at 4:30 Ranging from the Mid 100s Occasionally Spikes above 200s but These Are Not Consistent and at at Bedtime Sugars Appear to Be Largely in the 100s  Occasionally Spike Slightly above 200 but Not Consistent   Her hemoglobin A1c in in April 2018 was 6.5 which continues to improve at one point she has been as high as 8.6  Her weight is stable currently 203 pounds.  Her dementia appears to be stable she does well with supportive care she is on Namenda.  In regards to history of-relation she is on Xarelto for anticoagulation continues on Lopressor 100 mg twice a day for rate control  Currently she is sitting in her room comfortably she has just finished her dinner continues to be in good spirits somewhat confused but very pleasant and appropriate    .       Past Medical History:  Diagnosis Date  . Allergy   . Atrial fibrillation ([place]) 08/2011   First diagnosed in 08/2011; duration of arrhythmia is uncertain  . Bilateral lower extremity edema   . Chronic diarrhea    diverticulosis  . COPD (chronic obstructive pulmonary disease) ([place])   . Dementia   . Diabetes mellitus, type 2 (HCC)    Diabetic neuropathy  . Dyspnea on exertion    pedal edema  . Gout   . Headache(784.0)    twice weekly  . Hyperlipidemia   . Hypertension   . Osteopenia    DEXA scan 01/2010  . Palpitations   . Seasonal allergies   . Stress incontinence   . Vertigo    Past Surgical History:  Procedure Laterality Date  . APPENDECTOMY    . BREAST BIOPSY  2002   Right  . CESAREAN SECTION  x 2  . CHOLECYSTECTOMY    . COLONOSCOPY  2007   Negative screening study  . HAMMER TOE SURGERY     Bilateral hammer toe amputation  . INCISIONAL HERNIA REPAIR    . KNEE ARTHROSCOPY W/ MENISCAL REPAIR  2007   Bilateral  . UMBILICAL HERNIA REPAIR      Allergies  Allergen Reactions  . Codeine Anaphylaxis and Hives  . Morphine And Related Anaphylaxis and Hives  . Penicillins Anaphylaxis  . Ace Inhibitors Cough    Outpatient Encounter Prescriptions as of 09/07/2016  Medication Sig  . acetaminophen (TYLENOL) 325 MG tablet Take 650 mg by mouth every 6 (six)  hours as needed.  Marland Kitchen alendronate (FOSAMAX) 70 MG tablet Give 1 tablet by mouth on Tuesday. Take with a full glass of water on an empty stomach.  . carboxymethylcellulose (REFRESH PLUS) 0.5 % SOLN Place 1 drop into both eyes 4 (four) times daily.  . cetirizine (ZYRTEC) 10 MG tablet Take 1 tablet (10 mg total) by mouth daily.  . cholecalciferol (VITAMIN D) 1000 UNITS tablet Take 1,000 Units by mouth daily.  . citalopram (CELEXA) 20 MG tablet Take 1 tablet (20 mg total) by mouth daily.  . digoxin (LANOXIN) 0.125 MG tablet Take 1 tablet by mouth once a day (HOLD FOR AP UNDER 60)  . docusate sodium (COLACE) 100 MG capsule Take 200 mg by mouth daily as needed.   . furosemide (LASIX) 20 MG tablet Take 20 mg by mouth.  . guaifenesin (HUMIBID E) 400 MG TABS tablet Take 400 mg by mouth every 6 (six) hours as needed.  . Insulin Isophane & Regular Human (HUMULIN 70/30 KWIKPEN) (70-30) 100 UNIT/ML PEN Give 52 units subcutaneous in mornings. Give 30  units in the evening.  . Insulin Pen Needle (EASY TOUCH PEN NEEDLES) 31G X 8 MM MISC Use twice a day  . losartan (COZAAR) 25 MG tablet Take 25 mg by mouth daily.  . memantine (NAMENDA XR) 14 MG CP24 24 hr capsule Take 14 mg by mouth daily.  . metFORMIN (GLUCOPHAGE) 500 MG tablet Take 500 mg by mouth 2 (two) times daily with a meal.   . metoprolol (LOPRESSOR) 100 MG tablet Take 1 tablet (100 mg total) by mouth 2 (two) times daily.  . Multiple Vitamin (MULTIVITAMIN WITH MINERALS) TABS Take 1 tablet by mouth every morning.  Marland Kitchen POTASSIUM CHLORIDE ER PO Potassium chloride extended release capsule, give 10 meq by mouth once day  . pravastatin (PRAVACHOL) 40 MG tablet Take 1 tablet (40 mg total) by mouth every evening.  . traZODone (DESYREL) 50 MG tablet Take 50 mg by mouth at bedtime. As needed for sleep.  Alveda Reasons 20 MG TABS tablet TAKE (1) TABLET BY MOUTH ONCE DAILY.  . [DISCONTINUED] Probiotic Product (RISA-BID PROBIOTIC PO) Take 1 tablet by mouth twice a day   No  facility-administered encounter medications on file as of 09/07/2016.      Review of Systems  This is limited secondary to dementia but she is not complaining of fever or chills says her breathing is fine denies any chest pain continues with chronic venous stasis edema.  Has a good appetite appears she appeared she ate  everything on her plate this evening  Immunization History  Administered Date(s) Administered  . Influenza-Unspecified 02/04/2016  . Pneumococcal-Unspecified 02/08/2016   Pertinent  Health Maintenance Due  Topic Date Due  . FOOT EXAM  11/29/2016 (Originally 03/18/1951)  . OPHTHALMOLOGY EXAM  11/29/2016 (Originally 03/18/1951)  . DEXA  SCAN  11/29/2016 (Originally 03/18/2005)  . INFLUENZA VACCINE  11/30/2016  . PNA vac Low Risk Adult (2 of 2 - PCV13) 02/07/2017  . HEMOGLOBIN A1C  02/22/2017  . URINE MICROALBUMIN  05/11/2017   Fall Risk  11/06/2015 09/29/2015 08/31/2015 06/30/2015 02/24/2015  Falls in the past year? Yes No No No Yes  Number falls in past yr: 2 or more - - - 2 or more  Injury with Fall? No - - - No   Functional Status Survey:   Temperature is 98.6 pulse 80 respirations 18 blood pressure 119/78 weight is relatively stable at 203 pounds   Physical Exam   In general this is a well-nourished elderly female in no distress sitting in her wheelchair she is pleasant smiling conversational.  Her skin is warm and dry.  Eyes she has prescription lenses visual acuity appears grossly intact she does have prescription lenses sclera and conjunctiva are clear.  Oropharynx is clear mucous membranes moist.  Chest is clear to auscultation there is no labored breathing.  Heart is irregular irregular rate and rhythm without murmur gallop or rub she has chronic edema venous stasis changes bilaterally.  Abdomen is obese soft nontender with active bowel sounds.  Musculoskeletal moves all extremities 4 some lower extremity weakness with venous stasis changes.   Upper  extremity strength appears preserved.  Neurologic is grossly intact no lateralizing findings her speech is clear.  Psych she is oriented to self not to time or place but very pleasant and follows simple verbal commands with good humor and without difficulty.    Labs reviewed:  Recent Labs  05/12/16 0500 06/10/16 0700 08/23/16 0700  NA 138 140 139  K 4.2 4.3 4.3  CL 104 105 102  CO2 29 30 30   GLUCOSE 116* 111* 99  BUN 26* 25* 22*  CREATININE 0.91 0.95 0.89  CALCIUM 9.0 8.7* 9.3    Recent Labs  01/15/16 0710 05/12/16 0500  AST 20 13*  ALT 16 13*  ALKPHOS 61 49  BILITOT 0.6 0.7  PROT 7.6 6.6  ALBUMIN 3.3* 2.9*    Recent Labs  12/01/15 0734 01/15/16 0500 03/15/16 0500 05/12/16 0500 08/23/16 0700  WBC 7.5 7.1 7.5 6.5 7.9  NEUTROABS 4.9 4.0  --   --   --   HGB 12.0 14.0 13.8 12.0 13.7  HCT 37.4 44.3 42.1  40.1 37.3 43.0  MCV 92.3 94.7 92.3 92.6 92.3  PLT 300 194 214 224 234   Lab Results  Component Value Date   TSH 2.936 09/09/2011   Lab Results  Component Value Date   HGBA1C 6.5 (H) 08/23/2016   Lab Results  Component Value Date   CHOL 129 05/12/2016   HDL 35 (L) 05/12/2016   LDLCALC 79 05/12/2016   TRIG 73 05/12/2016   CHOLHDL 3.7 05/12/2016    Significant Diagnostic Results in last 30 days:  No results found.  Assessment/Plan  #1 atrial fibrillation this appears rate controlled on Xarelto she's also on digoxin as well as Lopressor   Digoxin level in February was 0.7.  We'll update this.  #2 diabetes type 2 hemoglobin A1c continues to improve at 6.5 blood sugars appear to be relatively stable as noted above.  She has been started on losartan secondary to her diabetes -- did not tolerate an ACE inhibitor apparently because of cough.  #3 hypertension this appears well controlled she is on losartan 25 mg a day-Lopressor 100 mg twice a day recent blood pressure today 119/78 which  is satisfactory.  #4 history of dementia with behaviors I do  not really note any recent behaviors she appears to be doing quite well with supportive care her weight is stable she continues on Namenda.  #5 osteoporosis she's been started on Fosamax  appears to be tolerating this well.  #6 history of hyperlipidemia she continues on Pravachol--LDL in January 2018 was satisfactory at 9.  #7 history of venous stasis changes with edema this appears stable she is on low-dose Lasix with potassium supplementation recent metabolic panel on April 24 shows stability--with a sodium of 139 potassium 4.3 creatinine 0.89 BUN of 22 will monitor at periodic intervals.   #8 history of orbital fracture this appears to be essentially resolved.  #9 history of diabetic retinopathy she is followed closely by ophthalmology again emphasis is on strict diabetic control as much as possible Appear she was last seen by ophthalmology 08/12/2016 and had an have a avastin injection in the left eye  #10 history of insomnia this apparently stable on trazodone.  11 history of chronic allergic rhinitis she continues on Zyrtec routinely and this appears to be stabilized as well  #12-history of vitamin D deficiency she is on supplementation thousand units daily last level was 29.8 back in November we'll update this as well.  #13 history depression she is followed by psychiatric services and appears to be stable in this regards-she was seen by psychiatric services on April 30-thought to be stable apparently she did have an episode where she threw water on someone thought her roommate was a man.  But on exam by psych. Thought To be her pleasant smiling usual presentation and recommendation was continue current medications including Celexa and Namenda possibly increase the Namenda if behaviors persist but it appears they have not persisted  [id number]-of note greater than 35 minutes spent assessing patient-reviewing her chart-reviewing her labs and consult notes-and coordinating and  formulating a plan of care for numerous diagnoses-of note greater than 50% of time spent coordinating plan of care

## 2016-09-09 ENCOUNTER — Encounter (HOSPITAL_COMMUNITY)
Admission: RE | Admit: 2016-09-09 | Discharge: 2016-09-09 | Disposition: A | Discharge status: Home / Self Care | Payer: Medicare Other | Source: Skilled Nursing Facility | Attending: Internal Medicine | Admitting: Internal Medicine

## 2016-09-09 DIAGNOSIS — G4709 Other insomnia: Secondary | ICD-10-CM | POA: Diagnosis not present

## 2016-09-09 DIAGNOSIS — F3289 Other specified depressive episodes: Secondary | ICD-10-CM | POA: Diagnosis not present

## 2016-09-09 DIAGNOSIS — M810 Age-related osteoporosis without current pathological fracture: Secondary | ICD-10-CM | POA: Diagnosis not present

## 2016-09-09 DIAGNOSIS — I5042 Chronic combined systolic (congestive) and diastolic (congestive) heart failure: Secondary | ICD-10-CM | POA: Insufficient documentation

## 2016-09-09 LAB — DIGOXIN LEVEL: Digoxin Level: 0.6 ng/mL — ABNORMAL LOW (ref 0.8–2.0)

## 2016-09-10 LAB — VITAMIN D 25 HYDROXY (VIT D DEFICIENCY, FRACTURES): Vit D, 25-Hydroxy: 33.1 ng/mL (ref 30.0–100.0)

## 2016-09-13 ENCOUNTER — Encounter: Payer: Self-pay | Admitting: Internal Medicine

## 2016-09-13 NOTE — Progress Notes (Signed)
Location:   [facility] Room Number: 113/W Place of Service:  SNF (31) Provider:  Dionicia Abler, MD  Patient Care Team: Celedonio Savage, MD as PCP - General (Family Medicine) Rothbart, Cristopher Estimable, MD (Cardiology) Lendon Colonel, NP as Nurse Practitioner (Nurse Practitioner)  Extended Emergency Contact Information Primary Emergency Contact: [facility] Address: [address]          [place], [place] [address] [name] of [place] Phone: [phone] Mobile Phone: [phone] Relation: Daughter  Code Status:  DNR Goals of care: Advanced Directive information Advanced Directives 09/13/2016  Does Patient Have a Medical Advance Directive? Yes  Type of Advance Directive Out of facility DNR (pink MOST or yellow form)  Does patient want to make changes to medical advance directive? No - Patient declined  Copy of [place] in Chart? -     Chief Complaint  Patient presents with  . Medical Management of Chronic Issues    Routine Visit    HPI:  Pt is a 76 y.o. female seen today for medical management of chronic diseases.     Past Medical History:  Diagnosis Date  . Allergy   . Atrial fibrillation ([place]) 08/2011   First diagnosed in 08/2011; duration of arrhythmia is uncertain  . Bilateral lower extremity edema   . Chronic diarrhea    diverticulosis  . COPD (chronic obstructive pulmonary disease) ([place])   . Dementia   . Diabetes mellitus, type 2 (HCC)    Diabetic neuropathy  . Dyspnea on exertion    pedal edema  . Gout   . Headache(784.0)    twice weekly  . Hyperlipidemia   . Hypertension   . Osteopenia    DEXA scan 01/2010  . Palpitations   . Seasonal allergies   . Stress incontinence   . Vertigo    Past Surgical History:  Procedure Laterality Date  . APPENDECTOMY    . BREAST BIOPSY  2002   Right  . CESAREAN SECTION     x 2  . CHOLECYSTECTOMY    . COLONOSCOPY  2007   Negative screening study  .  HAMMER TOE SURGERY     Bilateral hammer toe amputation  . INCISIONAL HERNIA REPAIR    . KNEE ARTHROSCOPY W/ MENISCAL REPAIR  2007   Bilateral  . UMBILICAL HERNIA REPAIR      Allergies  Allergen Reactions  . Codeine Anaphylaxis and Hives  . Morphine And Related Anaphylaxis and Hives  . Penicillins Anaphylaxis  . Ace Inhibitors Cough    Outpatient Encounter Prescriptions as of 09/13/2016  Medication Sig  . acetaminophen (TYLENOL) 325 MG tablet Take 650 mg by mouth every 6 (six) hours as needed.  Marland Kitchen alendronate (FOSAMAX) 70 MG tablet Give 1 tablet by mouth on Tuesday. Take with a full glass of water on an empty stomach.  . carboxymethylcellulose (REFRESH PLUS) 0.5 % SOLN Place 1 drop into both eyes 4 (four) times daily.  . cetirizine (ZYRTEC) 10 MG tablet Take 1 tablet (10 mg total) by mouth daily.  . cholecalciferol (VITAMIN D) 1000 UNITS tablet Take 1,000 Units by mouth daily.  . citalopram (CELEXA) 20 MG tablet Take 1 tablet (20 mg total) by mouth daily.  . digoxin (LANOXIN) 0.125 MG tablet Take 1 tablet by mouth once a day (HOLD FOR AP UNDER 60)  . docusate sodium (COLACE) 100 MG capsule Take 200 mg by mouth daily as needed.   . furosemide (LASIX) 20 MG tablet  Take 20 mg by mouth.  . guaifenesin (HUMIBID E) 400 MG TABS tablet Take 400 mg by mouth every 6 (six) hours as needed.  . Insulin Isophane & Regular Human (HUMULIN 70/30 KWIKPEN) (70-30) 100 UNIT/ML PEN Give 52 units subcutaneous in mornings. Give 30  units in the evening.  . Insulin Pen Needle (EASY TOUCH PEN NEEDLES) 31G X 8 MM MISC Use twice a day  . losartan (COZAAR) 25 MG tablet Take 25 mg by mouth daily.  . memantine (NAMENDA XR) 14 MG CP24 24 hr capsule Take 14 mg by mouth daily.  . metFORMIN (GLUCOPHAGE) 500 MG tablet Take 500 mg by mouth 2 (two) times daily with a meal.   . metoprolol (LOPRESSOR) 100 MG tablet Take 1 tablet (100 mg total) by mouth 2 (two) times daily.  . Multiple Vitamin (MULTIVITAMIN WITH MINERALS)  TABS Take 1 tablet by mouth every morning.  Marland Kitchen POTASSIUM CHLORIDE ER PO Potassium chloride extended release capsule, give 10 meq by mouth once day  . pravastatin (PRAVACHOL) 40 MG tablet Take 1 tablet (40 mg total) by mouth every evening.  . traZODone (DESYREL) 50 MG tablet Take 50 mg by mouth at bedtime. As needed for sleep.  Alveda Reasons 20 MG TABS tablet TAKE (1) TABLET BY MOUTH ONCE DAILY.   No facility-administered encounter medications on file as of 09/13/2016.      Review of Systems  Immunization History  Administered Date(s) Administered  . Influenza-Unspecified 02/04/2016  . Pneumococcal-Unspecified 02/08/2016   Pertinent  Health Maintenance Due  Topic Date Due  . FOOT EXAM  11/29/2016 (Originally 03/18/1951)  . OPHTHALMOLOGY EXAM  11/29/2016 (Originally 03/18/1951)  . DEXA SCAN  11/29/2016 (Originally 03/18/2005)  . INFLUENZA VACCINE  11/30/2016  . PNA vac Low Risk Adult (2 of 2 - PCV13) 02/07/2017  . HEMOGLOBIN A1C  02/22/2017  . URINE MICROALBUMIN  05/11/2017   Fall Risk  11/06/2015 09/29/2015 08/31/2015 06/30/2015 02/24/2015  Falls in the past year? Yes No No No Yes  Number falls in past yr: 2 or more - - - 2 or more  Injury with Fall? No - - - No   Functional Status Survey:    Vitals:   09/11/16 1255  BP: 114/66  Pulse: 81  Resp: 16  Temp: 98.2 F (36.8 C)  TempSrc: Oral   There is no height or weight on file to calculate BMI. Physical Exam  Labs reviewed:  Recent Labs  05/12/16 0500 06/10/16 0700 08/23/16 0700  NA 138 140 139  K 4.2 4.3 4.3  CL 104 105 102  CO2 29 30 30   GLUCOSE 116* 111* 99  BUN 26* 25* 22*  CREATININE 0.91 0.95 0.89  CALCIUM 9.0 8.7* 9.3    Recent Labs  01/15/16 0710 05/12/16 0500  AST 20 13*  ALT 16 13*  ALKPHOS 61 49  BILITOT 0.6 0.7  PROT 7.6 6.6  ALBUMIN 3.3* 2.9*    Recent Labs  12/01/15 0734 01/15/16 0500 03/15/16 0500 05/12/16 0500 08/23/16 0700  WBC 7.5 7.1 7.5 6.5 7.9  NEUTROABS 4.9 4.0  --   --   --     HGB 12.0 14.0 13.8 12.0 13.7  HCT 37.4 44.3 42.1  40.1 37.3 43.0  MCV 92.3 94.7 92.3 92.6 92.3  PLT 300 194 214 224 234   Lab Results  Component Value Date   TSH 2.936 09/09/2011   Lab Results  Component Value Date   HGBA1C 6.5 (H) 08/23/2016   Lab Results  Component Value Date   CHOL 129 05/12/2016   HDL 35 (L) 05/12/2016   LDLCALC 79 05/12/2016   TRIG 73 05/12/2016   CHOLHDL 3.7 05/12/2016    Significant Diagnostic Results in last 30 days:  No results found.  Assessment/Plan There are no diagnoses linked to this encounter.   Family/ staff Communication:   Labs/tests ordered:

## 2016-09-15 NOTE — Progress Notes (Signed)
This encounter was created in error - please disregard.

## 2016-09-18 NOTE — Progress Notes (Signed)
This encounter was created in error - please disregard.

## 2016-10-07 ENCOUNTER — Encounter (INDEPENDENT_AMBULATORY_CARE_PROVIDER_SITE_OTHER): Payer: Medicare Other | Admitting: Ophthalmology

## 2016-10-07 DIAGNOSIS — H34833 Tributary (branch) retinal vein occlusion, bilateral, with macular edema: Secondary | ICD-10-CM

## 2016-10-07 DIAGNOSIS — I10 Essential (primary) hypertension: Secondary | ICD-10-CM | POA: Diagnosis not present

## 2016-10-07 DIAGNOSIS — E11311 Type 2 diabetes mellitus with unspecified diabetic retinopathy with macular edema: Secondary | ICD-10-CM

## 2016-10-07 DIAGNOSIS — H35033 Hypertensive retinopathy, bilateral: Secondary | ICD-10-CM

## 2016-10-07 DIAGNOSIS — H43813 Vitreous degeneration, bilateral: Secondary | ICD-10-CM | POA: Diagnosis not present

## 2016-10-17 ENCOUNTER — Encounter: Payer: Self-pay | Admitting: Internal Medicine

## 2016-10-17 ENCOUNTER — Non-Acute Institutional Stay (SKILLED_NURSING_FACILITY): Payer: Medicare Other | Admitting: Internal Medicine

## 2016-10-17 DIAGNOSIS — Z794 Long term (current) use of insulin: Secondary | ICD-10-CM

## 2016-10-17 DIAGNOSIS — I872 Venous insufficiency (chronic) (peripheral): Secondary | ICD-10-CM

## 2016-10-17 DIAGNOSIS — I4891 Unspecified atrial fibrillation: Secondary | ICD-10-CM

## 2016-10-17 DIAGNOSIS — M810 Age-related osteoporosis without current pathological fracture: Secondary | ICD-10-CM | POA: Diagnosis not present

## 2016-10-17 DIAGNOSIS — E118 Type 2 diabetes mellitus with unspecified complications: Secondary | ICD-10-CM | POA: Diagnosis not present

## 2016-10-17 DIAGNOSIS — F0391 Unspecified dementia with behavioral disturbance: Secondary | ICD-10-CM | POA: Diagnosis not present

## 2016-10-17 NOTE — Progress Notes (Signed)
Location:   [facility] Room Number: 113/W Place of Service:  SNF (31) Provider:  Jefm Bryant, MD  Patient Care Team: Celedonio Savage, MD as PCP - General (Family Medicine) Rothbart, Cristopher Estimable, MD (Cardiology) Lendon Colonel, NP as Nurse Practitioner (Nurse Practitioner)  Extended Emergency Contact Information Primary Emergency Contact: [facility] Address: [address]          [place], [place] [address] [name] of [place] Phone: [phone] Mobile Phone: [phone] Relation: Daughter  Code Status:  DNR Goals of care: Advanced Directive information Advanced Directives 10/17/2016  Does Patient Have a Medical Advance Directive? Yes  Type of Advance Directive Out of facility DNR (pink MOST or yellow form)  Does patient want to make changes to medical advance directive? No - Patient declined  Copy of [place] in Chart? -     Chief Complaint  Patient presents with  . Medical Management of Chronic Issues    Routine Visit    HPI:  Pt is a 76 y.o. female seen today for medical management of chronic diseases.   Patient has h/o Diabetes mellitus type 2 , atrial fibrillation on chronic anticoagulation, Venous stasis, Dementia with Behavior problems, S/P fall leading to Ocular floor fracture.   Patient is doing well in facility. Her BS are running stable with few spikes of more then 200. Her last A1C was 6.5. She has gained almost 5 lbs since I saw her. Her weight is 208 lbs this month. And has some swelling in her LE. She denies any SOB, Cough or chest pain. Per nurses her appetite is good and no Behavior issues.    Past Medical History:  Diagnosis Date  . Allergy   . Atrial fibrillation ([place]) 08/2011   First diagnosed in 08/2011; duration of arrhythmia is uncertain  . Bilateral lower extremity edema   . Chronic diarrhea    diverticulosis  . COPD (chronic obstructive pulmonary disease) ([place])   .  Dementia   . Diabetes mellitus, type 2 (HCC)    Diabetic neuropathy  . Dyspnea on exertion    pedal edema  . Gout   . Headache(784.0)    twice weekly  . Hyperlipidemia   . Hypertension   . Osteopenia    DEXA scan 01/2010  . Palpitations   . Seasonal allergies   . Stress incontinence   . Vertigo    Past Surgical History:  Procedure Laterality Date  . APPENDECTOMY    . BREAST BIOPSY  2002   Right  . CESAREAN SECTION     x 2  . CHOLECYSTECTOMY    . COLONOSCOPY  2007   Negative screening study  . HAMMER TOE SURGERY     Bilateral hammer toe amputation  . INCISIONAL HERNIA REPAIR    . KNEE ARTHROSCOPY W/ MENISCAL REPAIR  2007   Bilateral  . UMBILICAL HERNIA REPAIR      Allergies  Allergen Reactions  . Codeine Anaphylaxis and Hives  . Morphine And Related Anaphylaxis and Hives  . Penicillins Anaphylaxis  . Ace Inhibitors Cough    Outpatient Encounter Prescriptions as of 10/17/2016  Medication Sig  . acetaminophen (TYLENOL) 325 MG tablet Take 650 mg by mouth every 6 (six) hours as needed.  Marland Kitchen alendronate (FOSAMAX) 70 MG tablet Give 1 tablet by mouth on Tuesday. Take with a full glass of water on an empty stomach.  . carboxymethylcellulose (REFRESH PLUS) 0.5 % SOLN Place 1 drop into both eyes  4 (four) times daily.  . cetirizine (ZYRTEC) 10 MG tablet Take 1 tablet (10 mg total) by mouth daily.  . cholecalciferol (VITAMIN D) 1000 UNITS tablet Take 1,000 Units by mouth daily.  . citalopram (CELEXA) 20 MG tablet Take 1 tablet (20 mg total) by mouth daily.  . digoxin (LANOXIN) 0.125 MG tablet Take 1 tablet by mouth once a day (HOLD FOR AP UNDER 60)  . docusate sodium (COLACE) 100 MG capsule Take 200 mg by mouth daily as needed.   . furosemide (LASIX) 20 MG tablet Take 20 mg by mouth.  . guaifenesin (HUMIBID E) 400 MG TABS tablet Take 400 mg by mouth every 6 (six) hours as needed.  . Insulin Isophane & Regular Human (HUMULIN 70/30 KWIKPEN) (70-30) 100 UNIT/ML PEN Give 52 units  subcutaneous in mornings. Give 30  units in the evening.  . Insulin Pen Needle (EASY TOUCH PEN NEEDLES) 31G X 8 MM MISC Use twice a day  . losartan (COZAAR) 25 MG tablet Take 25 mg by mouth daily.  . memantine (NAMENDA XR) 14 MG CP24 24 hr capsule Take 14 mg by mouth daily.  . metFORMIN (GLUCOPHAGE) 500 MG tablet Take 500 mg by mouth 2 (two) times daily with a meal.   . metoprolol (LOPRESSOR) 100 MG tablet Take 1 tablet (100 mg total) by mouth 2 (two) times daily.  . Multiple Vitamin (MULTIVITAMIN WITH MINERALS) TABS Take 1 tablet by mouth every morning.  Marland Kitchen POTASSIUM CHLORIDE ER PO Potassium chloride extended release capsule, give 10 meq by mouth once day  . pravastatin (PRAVACHOL) 40 MG tablet Take 1 tablet (40 mg total) by mouth every evening.  . traZODone (DESYREL) 50 MG tablet Take 50 mg by mouth at bedtime. As needed for sleep.  Alveda Reasons 20 MG TABS tablet TAKE (1) TABLET BY MOUTH ONCE DAILY.   No facility-administered encounter medications on file as of 10/17/2016.      Review of Systems  Unable to perform ROS: Dementia (Patient denies everything.)   Immunization History  Administered Date(s) Administered  . Influenza-Unspecified 02/04/2016  . Pneumococcal-Unspecified 02/08/2016   Pertinent  Health Maintenance Due  Topic Date Due  . FOOT EXAM  11/29/2016 (Originally 03/18/1951)  . OPHTHALMOLOGY EXAM  11/29/2016 (Originally 03/18/1951)  . DEXA SCAN  11/29/2016 (Originally 03/18/2005)  . INFLUENZA VACCINE  11/30/2016  . PNA vac Low Risk Adult (2 of 2 - PCV13) 02/07/2017  . HEMOGLOBIN A1C  02/22/2017  . URINE MICROALBUMIN  05/11/2017   Fall Risk  11/06/2015 09/29/2015 08/31/2015 06/30/2015 02/24/2015  Falls in the past year? Yes No No No Yes  Number falls in past yr: 2 or more - - - 2 or more  Injury with Fall? No - - - No   Functional Status Survey:    Vitals:   10/17/16 1145  BP: 108/73  Pulse: 75  Resp: 18  Temp: 98 F (36.7 C)  TempSrc: Oral   There is no height or  weight on file to calculate BMI. Physical Exam  Constitutional: She appears well-developed and well-nourished.  HENT:  Head: Normocephalic.  Mouth/Throat: Oropharynx is clear and moist.  Eyes: Pupils are equal, round, and reactive to light.  Neck: Neck supple.  Cardiovascular: Normal rate and normal heart sounds.  An irregular rhythm present.  Pulmonary/Chest: Effort normal and breath sounds normal. No respiratory distress. She has no wheezes. She has no rales.  Abdominal: Soft. Bowel sounds are normal. She exhibits no distension. There is no tenderness. There is  no rebound.  Musculoskeletal:  Moderate edema with Chronic stasis  Neurological: She is alert.  Not oriented to time or place.  Skin: Skin is warm and dry.  Psychiatric: She has a normal mood and affect. Her behavior is normal.   Labs reviewed:  Recent Labs  05/12/16 0500 06/10/16 0700 08/23/16 0700  NA 138 140 139  K 4.2 4.3 4.3  CL 104 105 102  CO2 29 30 30   GLUCOSE 116* 111* 99  BUN 26* 25* 22*  CREATININE 0.91 0.95 0.89  CALCIUM 9.0 8.7* 9.3    Recent Labs  01/15/16 0710 05/12/16 0500  AST 20 13*  ALT 16 13*  ALKPHOS 61 49  BILITOT 0.6 0.7  PROT 7.6 6.6  ALBUMIN 3.3* 2.9*    Recent Labs  12/01/15 0734 01/15/16 0500 03/15/16 0500 05/12/16 0500 08/23/16 0700  WBC 7.5 7.1 7.5 6.5 7.9  NEUTROABS 4.9 4.0  --   --   --   HGB 12.0 14.0 13.8 12.0 13.7  HCT 37.4 44.3 42.1  40.1 37.3 43.0  MCV 92.3 94.7 92.3 92.6 92.3  PLT 300 194 214 224 234   Lab Results  Component Value Date   TSH 2.936 09/09/2011   Lab Results  Component Value Date   HGBA1C 6.5 (H) 08/23/2016   Lab Results  Component Value Date   CHOL 129 05/12/2016   HDL 35 (L) 05/12/2016   LDLCALC 79 05/12/2016   TRIG 73 05/12/2016   CHOLHDL 3.7 05/12/2016    Significant Diagnostic Results in last 30 days:  No results found.  Assessment/Plan  Atrial fibrillation with rapid ventricular response  Doing well on On Lopressor,  Digoxin and Xarelto. Dig level was low when checked on 05/18 No recent falls or Injuries reported.  Essential hypertension BP well controlled  Diabetes mellitus Patient last A1C was 6.5 in 04/18 Her BS are well controlled. She did have microalbuminuria Is on Losartan and has tolerated well.  Increase in LE edema with Weight gain Patient is asymptomatic. Will Continue to monitor weight and check her BNP and BMP.   Dementia with behavioral disturbance,  Doing well on Namenda and celexa Will try to taper her off trazadone as she has been doing well.   Osteoporosis  She was started on Fosamax and is doing well. Hyperlipidemia LDL less then 100. On Pravachol.  Family/ staff Communication:   Labs/tests ordered:  BMP and BNP Weight weekly  Total time spent in this patient care encounter was 25_ minutes; greater than 50% of the visit spent  coordinating care for problems addressed at this encounter.

## 2016-10-18 ENCOUNTER — Encounter (HOSPITAL_COMMUNITY)
Admission: RE | Admit: 2016-10-18 | Discharge: 2016-10-18 | Disposition: A | Discharge status: Home / Self Care | Payer: Medicare Other | Source: Skilled Nursing Facility | Attending: Internal Medicine | Admitting: Internal Medicine

## 2016-10-18 DIAGNOSIS — G4709 Other insomnia: Secondary | ICD-10-CM | POA: Insufficient documentation

## 2016-10-18 DIAGNOSIS — I5042 Chronic combined systolic (congestive) and diastolic (congestive) heart failure: Secondary | ICD-10-CM | POA: Diagnosis not present

## 2016-10-18 DIAGNOSIS — F3289 Other specified depressive episodes: Secondary | ICD-10-CM | POA: Insufficient documentation

## 2016-10-18 DIAGNOSIS — M810 Age-related osteoporosis without current pathological fracture: Secondary | ICD-10-CM | POA: Diagnosis not present

## 2016-10-18 LAB — BASIC METABOLIC PANEL
ANION GAP: 6 (ref 5–15)
BUN: 26 mg/dL — ABNORMAL HIGH (ref 6–20)
CO2: 30 mmol/L (ref 22–32)
Calcium: 8.8 mg/dL — ABNORMAL LOW (ref 8.9–10.3)
Chloride: 105 mmol/L (ref 101–111)
Creatinine, Ser: 0.93 mg/dL (ref 0.44–1.00)
GFR calc Af Amer: >60 mL/min (ref >60)
GFR calc non Af Amer: 58 mL/min — ABNORMAL LOW (ref >60)
GLUCOSE: 90 mg/dL (ref 65–99)
POTASSIUM: 4.5 mmol/L (ref 3.5–5.1)
Sodium: 141 mmol/L (ref 135–145)

## 2016-10-18 LAB — BRAIN NATRIURETIC PEPTIDE: B Natriuretic Peptide: 215 pg/mL — ABNORMAL HIGH (ref 0.0–100.0)

## 2016-11-02 DIAGNOSIS — M6281 Muscle weakness (generalized): Secondary | ICD-10-CM | POA: Diagnosis not present

## 2016-11-02 DIAGNOSIS — R279 Unspecified lack of coordination: Secondary | ICD-10-CM | POA: Diagnosis not present

## 2016-11-02 DIAGNOSIS — M810 Age-related osteoporosis without current pathological fracture: Secondary | ICD-10-CM | POA: Diagnosis not present

## 2016-11-03 DIAGNOSIS — M6281 Muscle weakness (generalized): Secondary | ICD-10-CM | POA: Diagnosis not present

## 2016-11-03 DIAGNOSIS — R279 Unspecified lack of coordination: Secondary | ICD-10-CM | POA: Diagnosis not present

## 2016-11-03 DIAGNOSIS — M810 Age-related osteoporosis without current pathological fracture: Secondary | ICD-10-CM | POA: Diagnosis not present

## 2016-11-04 DIAGNOSIS — R279 Unspecified lack of coordination: Secondary | ICD-10-CM | POA: Diagnosis not present

## 2016-11-04 DIAGNOSIS — M810 Age-related osteoporosis without current pathological fracture: Secondary | ICD-10-CM | POA: Diagnosis not present

## 2016-11-04 DIAGNOSIS — M6281 Muscle weakness (generalized): Secondary | ICD-10-CM | POA: Diagnosis not present

## 2016-11-07 DIAGNOSIS — R279 Unspecified lack of coordination: Secondary | ICD-10-CM | POA: Diagnosis not present

## 2016-11-07 DIAGNOSIS — M6281 Muscle weakness (generalized): Secondary | ICD-10-CM | POA: Diagnosis not present

## 2016-11-07 DIAGNOSIS — M810 Age-related osteoporosis without current pathological fracture: Secondary | ICD-10-CM | POA: Diagnosis not present

## 2016-11-08 DIAGNOSIS — R279 Unspecified lack of coordination: Secondary | ICD-10-CM | POA: Diagnosis not present

## 2016-11-08 DIAGNOSIS — M6281 Muscle weakness (generalized): Secondary | ICD-10-CM | POA: Diagnosis not present

## 2016-11-08 DIAGNOSIS — M810 Age-related osteoporosis without current pathological fracture: Secondary | ICD-10-CM | POA: Diagnosis not present

## 2016-11-09 DIAGNOSIS — M810 Age-related osteoporosis without current pathological fracture: Secondary | ICD-10-CM | POA: Diagnosis not present

## 2016-11-09 DIAGNOSIS — R279 Unspecified lack of coordination: Secondary | ICD-10-CM | POA: Diagnosis not present

## 2016-11-09 DIAGNOSIS — M6281 Muscle weakness (generalized): Secondary | ICD-10-CM | POA: Diagnosis not present

## 2016-11-10 DIAGNOSIS — M810 Age-related osteoporosis without current pathological fracture: Secondary | ICD-10-CM | POA: Diagnosis not present

## 2016-11-10 DIAGNOSIS — M6281 Muscle weakness (generalized): Secondary | ICD-10-CM | POA: Diagnosis not present

## 2016-11-10 DIAGNOSIS — R279 Unspecified lack of coordination: Secondary | ICD-10-CM | POA: Diagnosis not present

## 2016-11-11 ENCOUNTER — Non-Acute Institutional Stay (SKILLED_NURSING_FACILITY): Payer: Medicare Other | Admitting: Internal Medicine

## 2016-11-11 ENCOUNTER — Encounter: Payer: Self-pay | Admitting: Internal Medicine

## 2016-11-11 DIAGNOSIS — Z794 Long term (current) use of insulin: Secondary | ICD-10-CM

## 2016-11-11 DIAGNOSIS — I4891 Unspecified atrial fibrillation: Secondary | ICD-10-CM | POA: Diagnosis not present

## 2016-11-11 DIAGNOSIS — E1142 Type 2 diabetes mellitus with diabetic polyneuropathy: Secondary | ICD-10-CM | POA: Diagnosis not present

## 2016-11-11 DIAGNOSIS — R279 Unspecified lack of coordination: Secondary | ICD-10-CM | POA: Diagnosis not present

## 2016-11-11 DIAGNOSIS — E1165 Type 2 diabetes mellitus with hyperglycemia: Secondary | ICD-10-CM

## 2016-11-11 DIAGNOSIS — M810 Age-related osteoporosis without current pathological fracture: Secondary | ICD-10-CM | POA: Diagnosis not present

## 2016-11-11 DIAGNOSIS — F0391 Unspecified dementia with behavioral disturbance: Secondary | ICD-10-CM

## 2016-11-11 DIAGNOSIS — M6281 Muscle weakness (generalized): Secondary | ICD-10-CM | POA: Diagnosis not present

## 2016-11-11 DIAGNOSIS — I872 Venous insufficiency (chronic) (peripheral): Secondary | ICD-10-CM | POA: Diagnosis not present

## 2016-11-11 DIAGNOSIS — E785 Hyperlipidemia, unspecified: Secondary | ICD-10-CM | POA: Diagnosis not present

## 2016-11-11 DIAGNOSIS — IMO0002 Reserved for concepts with insufficient information to code with codable children: Secondary | ICD-10-CM

## 2016-11-11 NOTE — Progress Notes (Signed)
Location:   [facility] Room Number: 113/W Place of Service:  SNF (31) Provider:  Dionicia Abler, MD  Patient Care Team: Celedonio Savage, MD as PCP - General (Family Medicine) Rothbart, Cristopher Estimable, MD (Cardiology) Lendon Colonel, NP as Nurse Practitioner (Nurse Practitioner)  Extended Emergency Contact Information Primary Emergency Contact: [facility] Address: [address]          [place], [place] [address] [name] of [place] Phone: [phone] Mobile Phone: [phone] Relation: Daughter  Code Status:  DNR Goals of care: Advanced Directive information Advanced Directives 11/11/2016  Does Patient Have a Medical Advance Directive? Yes  Type of Advance Directive Out of facility DNR (pink MOST or yellow form)  Does patient want to make changes to medical advance directive? No - Patient declined  Copy of [place] in Chart? -     Chief Complaint  Patient presents with  . Medical Management of Chronic Issues    Routine Visit  Medical management of chronic medical issues including type 2 diabetes-atrial fibrillation-venous stasis edema-dementia-hypertension-hyperlipidemia-osteoporosis-  HPI:  Pt is a 76 y.o. female seen today for medical management of chronic diseases. As noted above-.  She continues to be quite stable-she does have a history of type 2 diabetes is on Novolin 70/30 twice a day blood sugars in the morning appear to be in the 80s to low 100s-at noon average more in the mid 100s lately-at 4 PM also averaging in the mid 100s I ranges recently 81--196-.  At at bedtime blood sugars appear to be averaging more in the mid 100s occasionally above 200 but this is not frequent.  She does have a history of diabetic retinopathy is followed by ophthalmology with emphasis on strict diabetic control  Her weight appears to be stable with me pounds apparently she gained about 5 pounds couple months ago but this  appears to be stabilized.  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  Regards to atrial fibrillation this appears rate controlled on Lopressor as well as the junction she is on Xarelto for anticoagulation  She also has a distant history of anoxic fracture this appears to resolve unremarkably.          Past Medical History:  Diagnosis Date  . Allergy   . Atrial fibrillation ([place]) 08/2011   First diagnosed in 08/2011; duration of arrhythmia is uncertain  . Bilateral lower extremity edema   . Chronic diarrhea    diverticulosis  . COPD (chronic obstructive pulmonary disease) ([place])   . Dementia   . Diabetes mellitus, type 2 (HCC)    Diabetic neuropathy  . Dyspnea on exertion    pedal edema  . Gout   . Headache(784.0)    twice weekly  . Hyperlipidemia   . Hypertension   . Osteopenia    DEXA scan 01/2010  . Palpitations   . Seasonal allergies   . Stress incontinence   . Vertigo    Past Surgical History:  Procedure Laterality Date  . APPENDECTOMY    . BREAST BIOPSY  2002   Right  . CESAREAN SECTION     x 2  . CHOLECYSTECTOMY    . COLONOSCOPY  2007   Negative screening study  . HAMMER TOE SURGERY     Bilateral hammer toe amputation  . INCISIONAL HERNIA REPAIR    .  KNEE ARTHROSCOPY W/ MENISCAL REPAIR  2007   Bilateral  . UMBILICAL HERNIA REPAIR      Allergies  Allergen Reactions  . Codeine Anaphylaxis and Hives  . Morphine And Related Anaphylaxis and Hives  . Penicillins Anaphylaxis  . Ace Inhibitors Cough    Outpatient Encounter Prescriptions as of 11/11/2016  Medication Sig  . acetaminophen (TYLENOL) 325 MG tablet Take 650 mg by mouth every 6 (six) hours as needed.  Marland Kitchen alendronate (FOSAMAX) 70 MG tablet Give 1 tablet by mouth on Tuesday. Take with a full glass of water on an empty stomach.  . carboxymethylcellulose (REFRESH  PLUS) 0.5 % SOLN Place 1 drop into both eyes 4 (four) times daily.  . cholecalciferol (VITAMIN D) 1000 UNITS tablet Take 1,000 Units by mouth daily.  . citalopram (CELEXA) 20 MG tablet Take 1 tablet (20 mg total) by mouth daily.  . digoxin (LANOXIN) 0.125 MG tablet Take 1 tablet by mouth once a day (HOLD FOR AP UNDER 60)  . docusate sodium (COLACE) 100 MG capsule Take 200 mg by mouth daily as needed.   . furosemide (LASIX) 20 MG tablet Take 20 mg by mouth.  . guaifenesin (HUMIBID E) 400 MG TABS tablet Take 400 mg by mouth every 6 (six) hours as needed.  . insulin NPH-regular Human (NOVOLIN 70/30) (70-30) 100 UNIT/ML injection Give 30 units subcutaneous once a evening  . insulin NPH-regular Human (NOVOLIN 70/30) (70-30) 100 UNIT/ML injection Give 52 units subcutaneous one a morning  . Insulin Pen Needle (EASY TOUCH PEN NEEDLES) 31G X 8 MM MISC Use twice a day  . losartan (COZAAR) 25 MG tablet Take 25 mg by mouth daily.  . memantine (NAMENDA XR) 14 MG CP24 24 hr capsule Take 14 mg by mouth daily.  . metFORMIN (GLUCOPHAGE) 500 MG tablet Take 500 mg by mouth 2 (two) times daily with a meal.   . metoprolol (LOPRESSOR) 100 MG tablet Take 1 tablet (100 mg total) by mouth 2 (two) times daily.  . Multiple Vitamin (MULTIVITAMIN WITH MINERALS) TABS Take 1 tablet by mouth every morning.  Marland Kitchen POTASSIUM CHLORIDE ER PO Potassium chloride extended release capsule, give 10 meq by mouth once day  . pravastatin (PRAVACHOL) 40 MG tablet Take 1 tablet (40 mg total) by mouth every evening.  . traZODone (DESYREL) 50 MG tablet Take 25 mg by mouth at bedtime. As needed for sleep.   Alveda Reasons 20 MG TABS tablet TAKE (1) TABLET BY MOUTH ONCE DAILY.  . [DISCONTINUED] cetirizine (ZYRTEC) 10 MG tablet Take 1 tablet (10 mg total) by mouth daily.  . [DISCONTINUED] Insulin Isophane & Regular Human (HUMULIN 70/30 KWIKPEN) (70-30) 100 UNIT/ML PEN Give 52 units subcutaneous in mornings. Give 30  units in the evening.   No  facility-administered encounter medications on file as of 11/11/2016.      Review of Systems   This is limited secondary to dementia provided by nursing as well.  General no complaints of fever or chills her weight appears to have stabilized.  Ears eyes nose mouth and throat does not complain of sore throat or visual changes does have history of diabetic retinopathy.  Respiratory is not complaining of any cough or shortness of breath.  Cardiac denies chest pain has chronic lower extremity edema.  GI is not complaining of any abdominal discomfort nausea vomiting diarrhea or constipation.  GU does not complain of dysuria.  Musculoskeletal no complaints of joint pain noted.  Neurologic plate some numbness headache noted.  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 Influenza-Unspecified 02/04/2016  . Pneumococcal-Unspecified 02/08/2016   Pertinent  Health Maintenance Due  Topic Date Due  . FOOT EXAM  11/29/2016 (Originally 03/18/1951)  . OPHTHALMOLOGY EXAM  11/29/2016 (Originally 03/18/1951)  . DEXA SCAN  11/29/2016 (Originally 03/18/2005)  . INFLUENZA VACCINE  11/30/2016  . PNA vac Low Risk Adult (2 of 2 - PCV13) 02/07/2017  . HEMOGLOBIN A1C  02/22/2017  . URINE MICROALBUMIN  05/11/2017   Fall Risk  11/06/2015 09/29/2015 08/31/2015 06/30/2015 02/24/2015  Falls in the past year? Yes No No No Yes  Number falls in past yr: 2 or more - - - 2 or more  Injury with Fall? No - - - No   Functional Status Survey:    Vitals:   11/06/16 1430  BP: 138/75  Pulse: 80  Resp: 20  Temp: 97.8 F (36.6 C)  TempSrc: Oral  Weight: 208 lb 12.8 oz (94.7 kg)  Height: 5\' 6"  (1.676 m)   Manual blood pressure today was 110/62 Body mass index is 33.7 kg/m. Physical Exam In general this is a well-nourished elderly female in no distress sitting in her  wheelchair she is pleasant smiling conversational.  Her skin is warm and dry. Follow-up and Eyes she has prescription lenses visual acuity appears grossly intact she does have prescription lenses sclera and conjunctiva are clear.  Oropharynx is clear mucous membranes moist.  Chest is clear to auscultation there is no labored breathing.  Heart is irregular irregular rate and rhythm without murmur gallop or rub she has chronic edema venous stasis changes bilaterally this appears relatively unchanged from previous exams.  Abdomen is obese soft nontender with active bowel sounds.   Musculoskeletal moves all extremities 4 some lower extremity weakness with venous stasis changes. Which also appears to be baseline   Upper extremity strength appears preserved.  Neurologic is grossly intact no lateralizing findings her speech is clear.  Psych she is oriented to self not to time or place but very pleasant and follows simple verbal commands with good humor and without difficulty. She continues to be in good spirits Labs reviewed:  Recent Labs  06/10/16 0700 08/23/16 0700 10/18/16 0718  NA 140 139 141  K 4.3 4.3 4.5  CL 105 102 105  CO2 30 30 30   GLUCOSE 111* 99 90  BUN 25* 22* 26*  CREATININE 0.95 0.89 0.93  CALCIUM 8.7* 9.3 8.8*    Recent Labs  01/15/16 0710 05/12/16 0500  AST 20 13*  ALT 16 13*  ALKPHOS 61 49  BILITOT 0.6 0.7  PROT 7.6 6.6  ALBUMIN 3.3* 2.9*    Recent Labs  12/01/15 0734 01/15/16 0500 03/15/16 0500 05/12/16 0500 08/23/16 0700  WBC 7.5 7.1 7.5 6.5 7.9  NEUTROABS 4.9 4.0  --   --   --   HGB 12.0 14.0 13.8 12.0 13.7  HCT 37.4 44.3 42.1  40.1 37.3 43.0  MCV 92.3 94.7 92.3 92.6 92.3  PLT 300 194 214 224 234   Lab Results  Component Value Date   TSH 2.936 09/09/2011   Lab Results  Component Value Date   HGBA1C 6.5 (H) 08/23/2016   Lab Results  Component Value Date   CHOL 129 05/12/2016   HDL 35 (L) 05/12/2016   LDLCALC 79  05/12/2016   TRIG 73 05/12/2016   CHOLHDL 3.7  05/12/2016    Significant Diagnostic Results in last 30 days:  No results found.  Assessment/Plan  1 history diabetes type 2 has noted above CBGs appear to be relatively stable I do note some readings in the 80s in the morning will write an order to encourage HS snacks-hemoglobin A1c 6.5 continues to improve this was done in late April and  \#2 history of atrial fibrillation this appears rate controlled on pressor as well as digoxin will update a digoxin level continue on Xarelto for anticoagulation.  #3 history of edema with venous stasis changes at this point this appears to be relatively baseline continues on Lasix 20 mg a day will update a metabolic panel sure stability of renal function she is on low-dose potassium as well. She did have a history of weight gain a couple months ago but this appears stabilized--BNP on June 19 was only mildly elevated on June 19 at 215-I did review echo done in August 2016 which showed ejection fraction of 82-64 percent-diastolic function could not be assessed secondary to atrial fibrillation  #4-history of dementia with behaviors this has been stable she does well with supportive care she is on Namenda-as well as Celexa for coexistent depression.  5 insomnia continues on trazodone apparently this has been stable as well.  #6 history of ocular for fracture distant past this appears essentially was all.  #7 hypertension at this point appears to be stable she is on Lopressor also losartan low dose was recently started secondary to her history of diabetes-blood sugar today manually was 110/60 2SE previous reading 136/75 appears stable.  #8 history of hyperlipidemia she is on Pravachol LDL was less than 100-recent lipid panel.  9 history osteoporosis continues on Fosamax.  #10 history of diabetic retinopathy again strict glucose control was recommended-this appears stabilized hemoglobin A1c most recently was  6.5 which is showing continuemprovement  #11-history of vitamin D deficiency she is on supplementation vitamin D level in April was 33.1.  Of note will update again a CBC for updated values as well as a CMP and digoxin level-.  [id number] note greater than 35 minutes spent assessing patient-discussing her status with nursing staff-reviewing her chart-reviewing her labs-and coordinating and formulating a plan of care for numerous diagnoses

## 2016-11-14 ENCOUNTER — Encounter (HOSPITAL_COMMUNITY)
Admission: RE | Admit: 2016-11-14 | Discharge: 2016-11-14 | Disposition: A | Discharge status: Home / Self Care | Payer: Medicare Other | Source: Skilled Nursing Facility | Attending: Internal Medicine | Admitting: Internal Medicine

## 2016-11-14 DIAGNOSIS — M810 Age-related osteoporosis without current pathological fracture: Secondary | ICD-10-CM | POA: Diagnosis not present

## 2016-11-14 DIAGNOSIS — I5042 Chronic combined systolic (congestive) and diastolic (congestive) heart failure: Secondary | ICD-10-CM | POA: Diagnosis not present

## 2016-11-14 DIAGNOSIS — M6281 Muscle weakness (generalized): Secondary | ICD-10-CM | POA: Diagnosis not present

## 2016-11-14 DIAGNOSIS — F3289 Other specified depressive episodes: Secondary | ICD-10-CM | POA: Insufficient documentation

## 2016-11-14 DIAGNOSIS — G4709 Other insomnia: Secondary | ICD-10-CM | POA: Insufficient documentation

## 2016-11-14 DIAGNOSIS — R279 Unspecified lack of coordination: Secondary | ICD-10-CM | POA: Diagnosis not present

## 2016-11-14 LAB — COMPREHENSIVE METABOLIC PANEL
ALT: 15 U/L (ref 14–54)
AST: 15 U/L (ref 15–41)
Albumin: 3.2 g/dL — ABNORMAL LOW (ref 3.5–5.0)
Alkaline Phosphatase: 48 U/L (ref 38–126)
Anion gap: 7 (ref 5–15)
BUN: 26 mg/dL — AB (ref 6–20)
CHLORIDE: 104 mmol/L (ref 101–111)
CO2: 30 mmol/L (ref 22–32)
Calcium: 9.3 mg/dL (ref 8.9–10.3)
Creatinine, Ser: 1 mg/dL (ref 0.44–1.00)
GFR, EST NON AFRICAN AMERICAN: 53 mL/min — AB (ref >60)
Glucose, Bld: 104 mg/dL — ABNORMAL HIGH (ref 65–99)
POTASSIUM: 4.5 mmol/L (ref 3.5–5.1)
Sodium: 141 mmol/L (ref 135–145)
Total Bilirubin: 0.7 mg/dL (ref 0.3–1.2)
Total Protein: 7.2 g/dL (ref 6.5–8.1)

## 2016-11-14 LAB — CBC WITH DIFFERENTIAL/PLATELET
Basophils Absolute: 0 10*3/uL (ref 0.0–0.1)
Basophils Relative: 0 %
EOS PCT: 4 %
Eosinophils Absolute: 0.3 10*3/uL (ref 0.0–0.7)
HEMATOCRIT: 39.4 % (ref 36.0–46.0)
Hemoglobin: 12.6 g/dL (ref 12.0–15.0)
LYMPHS PCT: 30 %
Lymphs Abs: 2 10*3/uL (ref 0.7–4.0)
MCH: 29.6 pg (ref 26.0–34.0)
MCHC: 32 g/dL (ref 30.0–36.0)
MCV: 92.7 fL (ref 78.0–100.0)
MONO ABS: 0.6 10*3/uL (ref 0.1–1.0)
MONOS PCT: 10 %
NEUTROS ABS: 3.6 10*3/uL (ref 1.7–7.7)
Neutrophils Relative %: 56 %
PLATELETS: 231 10*3/uL (ref 150–400)
RBC: 4.25 MIL/uL (ref 3.87–5.11)
RDW: 14 % (ref 11.5–15.5)
WBC: 6.6 10*3/uL (ref 4.0–10.5)

## 2016-11-14 LAB — PHENYTOIN LEVEL, TOTAL

## 2016-11-15 ENCOUNTER — Encounter (HOSPITAL_COMMUNITY)
Admission: RE | Admit: 2016-11-15 | Discharge: 2016-11-15 | Disposition: A | Discharge status: Home / Self Care | Payer: Medicare Other | Source: Skilled Nursing Facility | Attending: Internal Medicine | Admitting: Internal Medicine

## 2016-11-15 DIAGNOSIS — F3289 Other specified depressive episodes: Secondary | ICD-10-CM | POA: Insufficient documentation

## 2016-11-15 DIAGNOSIS — I482 Chronic atrial fibrillation: Secondary | ICD-10-CM | POA: Insufficient documentation

## 2016-11-15 DIAGNOSIS — I5042 Chronic combined systolic (congestive) and diastolic (congestive) heart failure: Secondary | ICD-10-CM | POA: Diagnosis not present

## 2016-11-15 DIAGNOSIS — G4709 Other insomnia: Secondary | ICD-10-CM | POA: Diagnosis not present

## 2016-11-15 DIAGNOSIS — M6281 Muscle weakness (generalized): Secondary | ICD-10-CM | POA: Diagnosis not present

## 2016-11-15 DIAGNOSIS — M810 Age-related osteoporosis without current pathological fracture: Secondary | ICD-10-CM | POA: Insufficient documentation

## 2016-11-15 DIAGNOSIS — R279 Unspecified lack of coordination: Secondary | ICD-10-CM | POA: Diagnosis not present

## 2016-11-15 LAB — DIGOXIN LEVEL: Digoxin Level: 0.7 ng/mL — ABNORMAL LOW (ref 0.8–2.0)

## 2016-11-16 DIAGNOSIS — R279 Unspecified lack of coordination: Secondary | ICD-10-CM | POA: Diagnosis not present

## 2016-11-16 DIAGNOSIS — M810 Age-related osteoporosis without current pathological fracture: Secondary | ICD-10-CM | POA: Diagnosis not present

## 2016-11-16 DIAGNOSIS — M6281 Muscle weakness (generalized): Secondary | ICD-10-CM | POA: Diagnosis not present

## 2016-11-17 DIAGNOSIS — M810 Age-related osteoporosis without current pathological fracture: Secondary | ICD-10-CM | POA: Diagnosis not present

## 2016-11-17 DIAGNOSIS — R279 Unspecified lack of coordination: Secondary | ICD-10-CM | POA: Diagnosis not present

## 2016-11-17 DIAGNOSIS — M6281 Muscle weakness (generalized): Secondary | ICD-10-CM | POA: Diagnosis not present

## 2016-11-18 DIAGNOSIS — R279 Unspecified lack of coordination: Secondary | ICD-10-CM | POA: Diagnosis not present

## 2016-11-18 DIAGNOSIS — M6281 Muscle weakness (generalized): Secondary | ICD-10-CM | POA: Diagnosis not present

## 2016-11-18 DIAGNOSIS — M810 Age-related osteoporosis without current pathological fracture: Secondary | ICD-10-CM | POA: Diagnosis not present

## 2016-11-21 DIAGNOSIS — R279 Unspecified lack of coordination: Secondary | ICD-10-CM | POA: Diagnosis not present

## 2016-11-21 DIAGNOSIS — M6281 Muscle weakness (generalized): Secondary | ICD-10-CM | POA: Diagnosis not present

## 2016-11-21 DIAGNOSIS — M810 Age-related osteoporosis without current pathological fracture: Secondary | ICD-10-CM | POA: Diagnosis not present

## 2016-11-22 DIAGNOSIS — M6281 Muscle weakness (generalized): Secondary | ICD-10-CM | POA: Diagnosis not present

## 2016-11-22 DIAGNOSIS — R279 Unspecified lack of coordination: Secondary | ICD-10-CM | POA: Diagnosis not present

## 2016-11-22 DIAGNOSIS — M810 Age-related osteoporosis without current pathological fracture: Secondary | ICD-10-CM | POA: Diagnosis not present

## 2016-11-24 ENCOUNTER — Encounter: Payer: Self-pay | Admitting: Internal Medicine

## 2016-11-24 ENCOUNTER — Non-Acute Institutional Stay (SKILLED_NURSING_FACILITY): Payer: Medicare Other | Admitting: Internal Medicine

## 2016-11-24 DIAGNOSIS — F0391 Unspecified dementia with behavioral disturbance: Secondary | ICD-10-CM | POA: Diagnosis not present

## 2016-11-24 DIAGNOSIS — R600 Localized edema: Secondary | ICD-10-CM

## 2016-11-24 DIAGNOSIS — M6281 Muscle weakness (generalized): Secondary | ICD-10-CM | POA: Diagnosis not present

## 2016-11-24 DIAGNOSIS — R279 Unspecified lack of coordination: Secondary | ICD-10-CM | POA: Diagnosis not present

## 2016-11-24 DIAGNOSIS — L03115 Cellulitis of right lower limb: Secondary | ICD-10-CM | POA: Diagnosis not present

## 2016-11-24 DIAGNOSIS — Z794 Long term (current) use of insulin: Secondary | ICD-10-CM

## 2016-11-24 DIAGNOSIS — E1140 Type 2 diabetes mellitus with diabetic neuropathy, unspecified: Secondary | ICD-10-CM

## 2016-11-24 DIAGNOSIS — M810 Age-related osteoporosis without current pathological fracture: Secondary | ICD-10-CM | POA: Diagnosis not present

## 2016-11-24 NOTE — Progress Notes (Signed)
Location:   [facility] Room Number: 113/W Place of Service:  SNF (31) Provider:  Jefm Bryant, MD  Patient Care Team: Celedonio Savage, MD as PCP - General (Family Medicine) Rothbart, Cristopher Estimable, MD (Cardiology) Lendon Colonel, NP as Nurse Practitioner (Nurse Practitioner)  Extended Emergency Contact Information Primary Emergency Contact: [facility] Address: [address]          [place], [place] [address] [name] of [place] Phone: [phone] Mobile Phone: [phone] Relation: Daughter  Code Status: DNR  Goals of care: Advanced Directive information Advanced Directives 11/24/2016  Does Patient Have a Medical Advance Directive? Yes  Type of Advance Directive Out of facility DNR (pink MOST or yellow form)  Does patient want to make changes to medical advance directive? No - Patient declined  Copy of [place] in Chart? -     Chief Complaint  Patient presents with  . Acute Visit    Right leg red    HPI:  Pt is a 76 y.o. female seen today for an acute visit for redness of her Right Leg.  Patient has h/o Diabetes mellitus type 2 , atrial fibrillation on chronic anticoagulation, Venous stasis, Dementia with Behavior problems, S/P fall leading to Ocular floor fracture.  Patient was noticed to have increased swelling in her legs and also increased redness of right Leg. Patient has no pain or fever. She has gained almost 4 more pounds since my last visit in 06/18 Patient denies any SOB or Chest pain. She is unreliable due to her dementia  Past Medical History:  Diagnosis Date  . Allergy   . Atrial fibrillation ([place]) 08/2011   First diagnosed in 08/2011; duration of arrhythmia is uncertain  . Bilateral lower extremity edema   . Chronic diarrhea    diverticulosis  . COPD (chronic obstructive pulmonary disease) ([place])   . Dementia   . Diabetes mellitus, type 2 (HCC)    Diabetic neuropathy  . Dyspnea on  exertion    pedal edema  . Gout   . Headache(784.0)    twice weekly  . Hyperlipidemia   . Hypertension   . Osteopenia    DEXA scan 01/2010  . Palpitations   . Seasonal allergies   . Stress incontinence   . Vertigo    Past Surgical History:  Procedure Laterality Date  . APPENDECTOMY    . BREAST BIOPSY  2002   Right  . CESAREAN SECTION     x 2  . CHOLECYSTECTOMY    . COLONOSCOPY  2007   Negative screening study  . HAMMER TOE SURGERY     Bilateral hammer toe amputation  . INCISIONAL HERNIA REPAIR    . KNEE ARTHROSCOPY W/ MENISCAL REPAIR  2007   Bilateral  . UMBILICAL HERNIA REPAIR      Allergies  Allergen Reactions  . Codeine Anaphylaxis and Hives  . Morphine And Related Anaphylaxis and Hives  . Penicillins Anaphylaxis  . Ace Inhibitors Cough    Outpatient Encounter Prescriptions as of 11/24/2016  Medication Sig  . acetaminophen (TYLENOL) 325 MG tablet Take 650 mg by mouth every 6 (six) hours as needed.  Marland Kitchen alendronate (FOSAMAX) 70 MG tablet Give 1 tablet by mouth on Tuesday. Take with a full glass of water on an empty stomach.  . carboxymethylcellulose (REFRESH PLUS) 0.5 % SOLN Place 1 drop into both eyes 4 (four) times daily.  . cholecalciferol (VITAMIN D) 1000 UNITS tablet Take 1,000 Units by mouth  daily.  . citalopram (CELEXA) 20 MG tablet Take 1 tablet (20 mg total) by mouth daily.  . digoxin (LANOXIN) 0.125 MG tablet Take 1 tablet by mouth once a day (HOLD FOR AP UNDER 60)  . docusate sodium (COLACE) 100 MG capsule Take 200 mg by mouth daily as needed.   . furosemide (LASIX) 20 MG tablet Take 20 mg by mouth.  . guaifenesin (HUMIBID E) 400 MG TABS tablet Take 400 mg by mouth every 6 (six) hours as needed.  . insulin NPH-regular Human (NOVOLIN 70/30) (70-30) 100 UNIT/ML injection Give 30 units subcutaneous once a evening  . insulin NPH-regular Human (NOVOLIN 70/30) (70-30) 100 UNIT/ML injection Give 52 units subcutaneous one a morning  . Insulin Pen Needle (EASY  TOUCH PEN NEEDLES) 31G X 8 MM MISC Use twice a day  . losartan (COZAAR) 25 MG tablet Take 25 mg by mouth daily.  . memantine (NAMENDA) 10 MG tablet Take 10 mg by mouth 2 (two) times daily.  . metFORMIN (GLUCOPHAGE) 500 MG tablet Take 500 mg by mouth 2 (two) times daily with a meal.   . metoprolol (LOPRESSOR) 100 MG tablet Take 1 tablet (100 mg total) by mouth 2 (two) times daily.  . Multiple Vitamin (MULTIVITAMIN WITH MINERALS) TABS Take 1 tablet by mouth every morning.  Marland Kitchen POTASSIUM CHLORIDE ER PO Potassium chloride extended release capsule, give 10 meq by mouth once day  . pravastatin (PRAVACHOL) 40 MG tablet Take 1 tablet (40 mg total) by mouth every evening.  . traZODone (DESYREL) 50 MG tablet Take 25 mg by mouth at bedtime. As needed for sleep.   Alveda Reasons 20 MG TABS tablet TAKE (1) TABLET BY MOUTH ONCE DAILY.  . [DISCONTINUED] memantine (NAMENDA XR) 14 MG CP24 24 hr capsule Take 14 mg by mouth daily.   No facility-administered encounter medications on file as of 11/24/2016.      Review of Systems  Unable to perform ROS: Dementia    Immunization History  Administered Date(s) Administered  . Influenza-Unspecified 02/04/2016  . Pneumococcal-Unspecified 02/08/2016   Pertinent  Health Maintenance Due  Topic Date Due  . FOOT EXAM  11/29/2016 (Originally 03/18/1951)  . OPHTHALMOLOGY EXAM  11/29/2016 (Originally 03/18/1951)  . DEXA SCAN  11/29/2016 (Originally 03/18/2005)  . INFLUENZA VACCINE  11/30/2016  . PNA vac Low Risk Adult (2 of 2 - PCV13) 02/07/2017  . HEMOGLOBIN A1C  02/22/2017  . URINE MICROALBUMIN  05/11/2017   Fall Risk  11/06/2015 09/29/2015 08/31/2015 06/30/2015 02/24/2015  Falls in the past year? Yes No No No Yes  Number falls in past yr: 2 or more - - - 2 or more  Injury with Fall? No - - - No   Functional Status Survey:    Vitals:   11/24/16 1029  BP: 115/75  Pulse: 76  Resp: 20  Temp: 98.8 F (37.1 C)  TempSrc: Oral   There is no height or weight on file to  calculate BMI. Physical Exam  Constitutional: She appears well-developed and well-nourished.  HENT:  Head: Normocephalic.  Mouth/Throat: Oropharynx is clear and moist.  Eyes: Pupils are equal, round, and reactive to light.  Neck: Neck supple.  Cardiovascular: Normal rate.  An irregular rhythm present.  No murmur heard. Pulmonary/Chest: Breath sounds normal. No respiratory distress. She has no wheezes. She has no rales.  Abdominal: Soft. Bowel sounds are normal. She exhibits no distension. There is no tenderness. There is no rebound.  Musculoskeletal:  Moderate edema B/l  Neurological: She is  alert.  Skin:  Has redness in Right Leg and warmth. No tenderness or any Drainage.  Psychiatric: She has a normal mood and affect. Her behavior is normal.    Labs reviewed:  Recent Labs  08/23/16 0700 10/18/16 0718 11/14/16 0730  NA 139 141 141  K 4.3 4.5 4.5  CL 102 105 104  CO2 30 30 30   GLUCOSE 99 90 104*  BUN 22* 26* 26*  CREATININE 0.89 0.93 1.00  CALCIUM 9.3 8.8* 9.3    Recent Labs  01/15/16 0710 05/12/16 0500 11/14/16 0730  AST 20 13* 15  ALT 16 13* 15  ALKPHOS 61 49 48  BILITOT 0.6 0.7 0.7  PROT 7.6 6.6 7.2  ALBUMIN 3.3* 2.9* 3.2*    Recent Labs  12/01/15 0734 01/15/16 0500  05/12/16 0500 08/23/16 0700 11/14/16 0730  WBC 7.5 7.1  < > 6.5 7.9 6.6  NEUTROABS 4.9 4.0  --   --   --  3.6  HGB 12.0 14.0  < > 12.0 13.7 12.6  HCT 37.4 44.3  < > 37.3 43.0 39.4  MCV 92.3 94.7  < > 92.6 92.3 92.7  PLT 300 194  < > 224 234 231  < > = values in this interval not displayed. Lab Results  Component Value Date   TSH 2.936 09/09/2011   Lab Results  Component Value Date   HGBA1C 6.5 (H) 08/23/2016   Lab Results  Component Value Date   CHOL 129 05/12/2016   HDL 35 (L) 05/12/2016   LDLCALC 79 05/12/2016   TRIG 73 05/12/2016   CHOLHDL 3.7 05/12/2016    Significant Diagnostic Results in last 30 days:  No results found.  Assessment/Plan  Cellulitis of right  lower extremity Will start patient on Doxycyline for 10 days Will follow in few days to see the change.  Leg edema Worsening with increased weight Will increased her lasix to 40 mg.  Type 2 diabetes mellitus  BS are running less then 200 most of the time.  Also repeat A1C Last was 6.5 done in 04/18. She did have microalbuminuria Is on Losartan and has tolerated well  Dementia Doing well in facility Continue Namenda.and Celexa Will continue to taper Trazodone ? DC in next visit.  Osteoporosis  She was started on Fosamax and is doing well. Hyperlipidemia LDL less then 100. On Pravachol.   Family/ staff Communication:   Labs/tests ordered:  BMP, CBC, A1C Continue weights Total time spent in this patient care encounter was 45_ minutes; greater than 50% of the visit spent counseling patient, reviewing records , Labs and coordinating care for problems addressed at this encounter.

## 2016-11-25 ENCOUNTER — Encounter (INDEPENDENT_AMBULATORY_CARE_PROVIDER_SITE_OTHER): Payer: Medicare Other | Admitting: Ophthalmology

## 2016-11-25 ENCOUNTER — Non-Acute Institutional Stay (SKILLED_NURSING_FACILITY): Payer: Medicare Other | Admitting: Internal Medicine

## 2016-11-25 DIAGNOSIS — L03115 Cellulitis of right lower limb: Secondary | ICD-10-CM

## 2016-11-25 DIAGNOSIS — M810 Age-related osteoporosis without current pathological fracture: Secondary | ICD-10-CM | POA: Diagnosis not present

## 2016-11-25 DIAGNOSIS — R609 Edema, unspecified: Secondary | ICD-10-CM

## 2016-11-25 DIAGNOSIS — R279 Unspecified lack of coordination: Secondary | ICD-10-CM | POA: Diagnosis not present

## 2016-11-25 DIAGNOSIS — M6281 Muscle weakness (generalized): Secondary | ICD-10-CM | POA: Diagnosis not present

## 2016-11-26 NOTE — Progress Notes (Signed)
This is an acute visit.  Level care skilled.  Facility is [facility].  Chief complaint-acute visit to follow up suspected right leg cellulitis  History of present illness.  Patient is a pleasant 76 year old female who was seen by Dr. Lyndel Safe yesterday for recent erythema of her right leg there was also some increased edema.  She was started on doxycycline 10 days-her Lasix was also increased to 40 mg.  Today she appears to be stable erythema actually appears to have receded apparently she received her first dose of antibiotic this morning-  She is not complaining of any fever or chills continues to be pleasant somewhat confused but very cooperative does not really complain of any leg pain  Past Medical History:  Diagnosis Date  . Allergy   . Atrial fibrillation ([place]) 08/2011   First diagnosed in 08/2011; duration of arrhythmia is uncertain  . Bilateral lower extremity edema   . Chronic diarrhea    diverticulosis  . COPD (chronic obstructive pulmonary disease) ([place])   . Dementia   . Diabetes mellitus, type 2 (HCC)    Diabetic neuropathy  . Dyspnea on exertion    pedal edema  . Gout   . Headache(784.0)    twice weekly  . Hyperlipidemia   . Hypertension   . Osteopenia    DEXA scan 01/2010  . Palpitations   . Seasonal allergies   . Stress incontinence   . Vertigo         Past Surgical History:  Procedure Laterality Date  . APPENDECTOMY    . BREAST BIOPSY  2002   Right  . CESAREAN SECTION     x 2  . CHOLECYSTECTOMY    . COLONOSCOPY  2007   Negative screening study  . HAMMER TOE SURGERY     Bilateral hammer toe amputation  . INCISIONAL HERNIA REPAIR    . KNEE ARTHROSCOPY W/ MENISCAL REPAIR  2007   Bilateral  . UMBILICAL HERNIA REPAIR          Allergies  Allergen Reactions  . Codeine Anaphylaxis and Hives  . Morphine And Related Anaphylaxis and Hives  . Penicillins Anaphylaxis  . Ace Inhibitors Cough        Outpatient Encounter Prescriptions as of 11/24/2016  Medication Sig  . acetaminophen (TYLENOL) 325 MG tablet Take 650 mg by mouth every 6 (six) hours as needed.  Marland Kitchen alendronate (FOSAMAX) 70 MG tablet Give 1 tablet by mouth on Tuesday. Take with a full glass of water on an empty stomach.  . carboxymethylcellulose (REFRESH PLUS) 0.5 % SOLN Place 1 drop into both eyes 4 (four) times daily.  . cholecalciferol (VITAMIN D) 1000 UNITS tablet Take 1,000 Units by mouth daily.  . citalopram (CELEXA) 20 MG tablet Take 1 tablet (20 mg total) by mouth daily.  . digoxin (LANOXIN) 0.125 MG tablet Take 1 tablet by mouth once a day (HOLD FOR AP UNDER 60)  . docusate sodium (COLACE) 100 MG capsule Take 200 mg by mouth daily as needed.   . furosemide (LASIX) 40 MG tablet Take 40g by mouth.  . guaifenesin (HUMIBID E) 400 MG TABS tablet Take 400 mg by mouth every 6 (six) hours as needed.  . insulin NPH-regular Human (NOVOLIN 70/30) (70-30) 100 UNIT/ML injection Give 3 units subcutaneous once a evening  . insulin NPH-regular Human (NOVOLIN 70/30) (70-30) 100 UNIT/ML injection Give 52 units subcutaneous one a morning  . Insulin Pen Needle (EASY TOUCH PEN NEEDLES) 31G X 8 MM MISC Use  twice a day  . losartan (COZAAR) 25 MG tablet Take 25 mg by mouth daily.  . memantine (NAMENDA) 10 MG tablet Take 10 mg by mouth 2 (two) times daily.  . metFORMIN (GLUCOPHAGE) 500 MG tablet Take 500 mg by mouth 2 (two) times daily with a meal.   . metoprolol (LOPRESSOR) 100 MG tablet Take 1 tablet (100 mg total) by mouth 2 (two) times daily.  . Multiple Vitamin (MULTIVITAMIN WITH MINERALS) TABS Take 1 tablet by mouth every morning.  Marland Kitchen POTASSIUM CHLORIDE ER PO Potassium chloride extended release capsule, give 10 meq by mouth once day  . pravastatin (PRAVACHOL) 40 MG tablet Take 1 tablet (40 mg total) by mouth every evening.  . traZODone (DESYREL) 50 MG tablet Take 25 mg by mouth at bedtime. As needed for sleep.   Alveda Reasons 20 MG TABS  tablet TAKE (1) TABLET BY MOUTH ONCE DAILY.  . [DISCONTINUED] memantine (NAMENDA XR) 14 MG CP24 24 hr capsule Take 14 mg by mouth daily.   .    Of note she is also on doxycycline 100 mg twice a day for 10 days dose was started apparently on 11/25/2016     Review of systems this is limited secondary to dementia.  Please see history of present illness again she is not complaining of any fever or chills shortness of breath or cough at this time does not complaining of leg pain   Physical exam.  She is afebrile pulses 72 respirations 18.  General this is a pleasant elderly female in no distress sitting comfortably in her wheelchair.  Her skin is warm and dry.  Right lower leg she does have erythema from her foot extending partially upr lower leg however this appears to have receded from yesterday-there is a magic marker line -- erythema appears to be receding from this--did not really appreciate any drainage or acute warm  Her chest she does have shallow air entry but could not really appreciate any overt congestion.  Heart is irregular irregular rate and rhythm without murmur gallop or rub she continues with moderate edema bilaterally this does not appear grossly changed from previous exams.   Psych she is oriented to self is very pleasant and appropriate follow verbal commands without difficulty continues to be in good spirits.  .  Labs  11/15/2015.  Sodium 141 potassium 5 BUN 26 creatinine 1.00.  Liver function tests within normal limits except albumin of 3.2.  White count 6.6 hemoglobin 12.6 platelets of 231.  I note the BNP on June 19-2018 was 215   Assessment and plan.  #1 cellulitis of right lower leg this appears to be somewhat  Improved  from yesterday she has been started on doxycycline first day in 10 days-at this point continue to monitor she does not complain of fever chills or drainage.  #2 history of leg edema apparently this worsened with some weight  gain of about 4 pounds recently her Lasix has been increased to 40 mg a day-update BMP is pending on 11/28/2016 clinically she appears to be stable does not complain of shortness of breath I could not really appreciate chest congestion   [id number]

## 2016-11-28 ENCOUNTER — Encounter (HOSPITAL_COMMUNITY)
Admission: RE | Admit: 2016-11-28 | Discharge: 2016-11-28 | Disposition: A | Discharge status: Home / Self Care | Payer: Medicare Other | Source: Skilled Nursing Facility | Attending: *Deleted | Admitting: *Deleted

## 2016-11-28 DIAGNOSIS — M810 Age-related osteoporosis without current pathological fracture: Secondary | ICD-10-CM | POA: Diagnosis not present

## 2016-11-28 DIAGNOSIS — R279 Unspecified lack of coordination: Secondary | ICD-10-CM | POA: Diagnosis not present

## 2016-11-28 DIAGNOSIS — M6281 Muscle weakness (generalized): Secondary | ICD-10-CM | POA: Diagnosis not present

## 2016-11-28 DIAGNOSIS — I5042 Chronic combined systolic (congestive) and diastolic (congestive) heart failure: Secondary | ICD-10-CM | POA: Diagnosis not present

## 2016-11-28 DIAGNOSIS — F3289 Other specified depressive episodes: Secondary | ICD-10-CM | POA: Diagnosis not present

## 2016-11-28 DIAGNOSIS — G4709 Other insomnia: Secondary | ICD-10-CM | POA: Diagnosis not present

## 2016-11-28 LAB — CBC
HEMATOCRIT: 41.1 % (ref 36.0–46.0)
HEMOGLOBIN: 13.1 g/dL (ref 12.0–15.0)
MCH: 29.6 pg (ref 26.0–34.0)
MCHC: 31.9 g/dL (ref 30.0–36.0)
MCV: 93 fL (ref 78.0–100.0)
Platelets: 249 10*3/uL (ref 150–400)
RBC: 4.42 MIL/uL (ref 3.87–5.11)
RDW: 13.8 % (ref 11.5–15.5)
WBC: 8.3 10*3/uL (ref 4.0–10.5)

## 2016-11-28 LAB — BASIC METABOLIC PANEL
Anion gap: 8 (ref 5–15)
BUN: 26 mg/dL — AB (ref 6–20)
CHLORIDE: 102 mmol/L (ref 101–111)
CO2: 31 mmol/L (ref 22–32)
CREATININE: 0.94 mg/dL (ref 0.44–1.00)
Calcium: 9.2 mg/dL (ref 8.9–10.3)
GFR calc Af Amer: >60 mL/min (ref >60)
GFR calc non Af Amer: 57 mL/min — ABNORMAL LOW (ref >60)
GLUCOSE: 86 mg/dL (ref 65–99)
Potassium: 4.4 mmol/L (ref 3.5–5.1)
Sodium: 141 mmol/L (ref 135–145)

## 2016-11-29 DIAGNOSIS — M6281 Muscle weakness (generalized): Secondary | ICD-10-CM | POA: Diagnosis not present

## 2016-11-29 DIAGNOSIS — R279 Unspecified lack of coordination: Secondary | ICD-10-CM | POA: Diagnosis not present

## 2016-11-29 DIAGNOSIS — M810 Age-related osteoporosis without current pathological fracture: Secondary | ICD-10-CM | POA: Diagnosis not present

## 2016-11-29 LAB — HEMOGLOBIN A1C
HEMOGLOBIN A1C: 6.5 % — AB (ref 4.8–5.6)
Mean Plasma Glucose: 140 mg/dL

## 2016-11-30 DIAGNOSIS — F0391 Unspecified dementia with behavioral disturbance: Secondary | ICD-10-CM | POA: Diagnosis not present

## 2016-11-30 DIAGNOSIS — F3289 Other specified depressive episodes: Secondary | ICD-10-CM | POA: Diagnosis not present

## 2016-11-30 DIAGNOSIS — M6281 Muscle weakness (generalized): Secondary | ICD-10-CM | POA: Diagnosis not present

## 2016-11-30 DIAGNOSIS — R41841 Cognitive communication deficit: Secondary | ICD-10-CM | POA: Diagnosis not present

## 2016-11-30 DIAGNOSIS — M810 Age-related osteoporosis without current pathological fracture: Secondary | ICD-10-CM | POA: Diagnosis not present

## 2016-11-30 DIAGNOSIS — R279 Unspecified lack of coordination: Secondary | ICD-10-CM | POA: Diagnosis not present

## 2016-12-01 DIAGNOSIS — R279 Unspecified lack of coordination: Secondary | ICD-10-CM | POA: Diagnosis not present

## 2016-12-01 DIAGNOSIS — F0391 Unspecified dementia with behavioral disturbance: Secondary | ICD-10-CM | POA: Diagnosis not present

## 2016-12-01 DIAGNOSIS — M810 Age-related osteoporosis without current pathological fracture: Secondary | ICD-10-CM | POA: Diagnosis not present

## 2016-12-01 DIAGNOSIS — F3289 Other specified depressive episodes: Secondary | ICD-10-CM | POA: Diagnosis not present

## 2016-12-01 DIAGNOSIS — M6281 Muscle weakness (generalized): Secondary | ICD-10-CM | POA: Diagnosis not present

## 2016-12-01 DIAGNOSIS — R41841 Cognitive communication deficit: Secondary | ICD-10-CM | POA: Diagnosis not present

## 2016-12-06 DIAGNOSIS — R41841 Cognitive communication deficit: Secondary | ICD-10-CM | POA: Diagnosis not present

## 2016-12-06 DIAGNOSIS — F0391 Unspecified dementia with behavioral disturbance: Secondary | ICD-10-CM | POA: Diagnosis not present

## 2016-12-06 DIAGNOSIS — F3289 Other specified depressive episodes: Secondary | ICD-10-CM | POA: Diagnosis not present

## 2016-12-06 DIAGNOSIS — M6281 Muscle weakness (generalized): Secondary | ICD-10-CM | POA: Diagnosis not present

## 2016-12-06 DIAGNOSIS — R279 Unspecified lack of coordination: Secondary | ICD-10-CM | POA: Diagnosis not present

## 2016-12-06 DIAGNOSIS — M810 Age-related osteoporosis without current pathological fracture: Secondary | ICD-10-CM | POA: Diagnosis not present

## 2016-12-07 DIAGNOSIS — F3289 Other specified depressive episodes: Secondary | ICD-10-CM | POA: Diagnosis not present

## 2016-12-07 DIAGNOSIS — M6281 Muscle weakness (generalized): Secondary | ICD-10-CM | POA: Diagnosis not present

## 2016-12-07 DIAGNOSIS — M810 Age-related osteoporosis without current pathological fracture: Secondary | ICD-10-CM | POA: Diagnosis not present

## 2016-12-07 DIAGNOSIS — R279 Unspecified lack of coordination: Secondary | ICD-10-CM | POA: Diagnosis not present

## 2016-12-07 DIAGNOSIS — F0391 Unspecified dementia with behavioral disturbance: Secondary | ICD-10-CM | POA: Diagnosis not present

## 2016-12-07 DIAGNOSIS — R41841 Cognitive communication deficit: Secondary | ICD-10-CM | POA: Diagnosis not present

## 2016-12-08 DIAGNOSIS — R41841 Cognitive communication deficit: Secondary | ICD-10-CM | POA: Diagnosis not present

## 2016-12-08 DIAGNOSIS — M6281 Muscle weakness (generalized): Secondary | ICD-10-CM | POA: Diagnosis not present

## 2016-12-08 DIAGNOSIS — R279 Unspecified lack of coordination: Secondary | ICD-10-CM | POA: Diagnosis not present

## 2016-12-08 DIAGNOSIS — F3289 Other specified depressive episodes: Secondary | ICD-10-CM | POA: Diagnosis not present

## 2016-12-08 DIAGNOSIS — M810 Age-related osteoporosis without current pathological fracture: Secondary | ICD-10-CM | POA: Diagnosis not present

## 2016-12-08 DIAGNOSIS — F0391 Unspecified dementia with behavioral disturbance: Secondary | ICD-10-CM | POA: Diagnosis not present

## 2016-12-09 DIAGNOSIS — M810 Age-related osteoporosis without current pathological fracture: Secondary | ICD-10-CM | POA: Diagnosis not present

## 2016-12-09 DIAGNOSIS — F0391 Unspecified dementia with behavioral disturbance: Secondary | ICD-10-CM | POA: Diagnosis not present

## 2016-12-09 DIAGNOSIS — R279 Unspecified lack of coordination: Secondary | ICD-10-CM | POA: Diagnosis not present

## 2016-12-09 DIAGNOSIS — F3289 Other specified depressive episodes: Secondary | ICD-10-CM | POA: Diagnosis not present

## 2016-12-09 DIAGNOSIS — M6281 Muscle weakness (generalized): Secondary | ICD-10-CM | POA: Diagnosis not present

## 2016-12-09 DIAGNOSIS — R41841 Cognitive communication deficit: Secondary | ICD-10-CM | POA: Diagnosis not present

## 2016-12-10 DIAGNOSIS — M6281 Muscle weakness (generalized): Secondary | ICD-10-CM | POA: Diagnosis not present

## 2016-12-10 DIAGNOSIS — F0391 Unspecified dementia with behavioral disturbance: Secondary | ICD-10-CM | POA: Diagnosis not present

## 2016-12-10 DIAGNOSIS — F3289 Other specified depressive episodes: Secondary | ICD-10-CM | POA: Diagnosis not present

## 2016-12-10 DIAGNOSIS — R279 Unspecified lack of coordination: Secondary | ICD-10-CM | POA: Diagnosis not present

## 2016-12-10 DIAGNOSIS — R41841 Cognitive communication deficit: Secondary | ICD-10-CM | POA: Diagnosis not present

## 2016-12-10 DIAGNOSIS — M810 Age-related osteoporosis without current pathological fracture: Secondary | ICD-10-CM | POA: Diagnosis not present

## 2016-12-12 DIAGNOSIS — F3289 Other specified depressive episodes: Secondary | ICD-10-CM | POA: Diagnosis not present

## 2016-12-12 DIAGNOSIS — R279 Unspecified lack of coordination: Secondary | ICD-10-CM | POA: Diagnosis not present

## 2016-12-12 DIAGNOSIS — F0391 Unspecified dementia with behavioral disturbance: Secondary | ICD-10-CM | POA: Diagnosis not present

## 2016-12-12 DIAGNOSIS — R41841 Cognitive communication deficit: Secondary | ICD-10-CM | POA: Diagnosis not present

## 2016-12-12 DIAGNOSIS — M6281 Muscle weakness (generalized): Secondary | ICD-10-CM | POA: Diagnosis not present

## 2016-12-12 DIAGNOSIS — M810 Age-related osteoporosis without current pathological fracture: Secondary | ICD-10-CM | POA: Diagnosis not present

## 2016-12-13 DIAGNOSIS — M810 Age-related osteoporosis without current pathological fracture: Secondary | ICD-10-CM | POA: Diagnosis not present

## 2016-12-13 DIAGNOSIS — F0391 Unspecified dementia with behavioral disturbance: Secondary | ICD-10-CM | POA: Diagnosis not present

## 2016-12-13 DIAGNOSIS — R41841 Cognitive communication deficit: Secondary | ICD-10-CM | POA: Diagnosis not present

## 2016-12-13 DIAGNOSIS — F3289 Other specified depressive episodes: Secondary | ICD-10-CM | POA: Diagnosis not present

## 2016-12-13 DIAGNOSIS — M6281 Muscle weakness (generalized): Secondary | ICD-10-CM | POA: Diagnosis not present

## 2016-12-13 DIAGNOSIS — R279 Unspecified lack of coordination: Secondary | ICD-10-CM | POA: Diagnosis not present

## 2016-12-14 DIAGNOSIS — M810 Age-related osteoporosis without current pathological fracture: Secondary | ICD-10-CM | POA: Diagnosis not present

## 2016-12-14 DIAGNOSIS — F0391 Unspecified dementia with behavioral disturbance: Secondary | ICD-10-CM | POA: Diagnosis not present

## 2016-12-14 DIAGNOSIS — M6281 Muscle weakness (generalized): Secondary | ICD-10-CM | POA: Diagnosis not present

## 2016-12-14 DIAGNOSIS — R279 Unspecified lack of coordination: Secondary | ICD-10-CM | POA: Diagnosis not present

## 2016-12-14 DIAGNOSIS — R41841 Cognitive communication deficit: Secondary | ICD-10-CM | POA: Diagnosis not present

## 2016-12-14 DIAGNOSIS — F3289 Other specified depressive episodes: Secondary | ICD-10-CM | POA: Diagnosis not present

## 2016-12-15 DIAGNOSIS — F3289 Other specified depressive episodes: Secondary | ICD-10-CM | POA: Diagnosis not present

## 2016-12-15 DIAGNOSIS — R41841 Cognitive communication deficit: Secondary | ICD-10-CM | POA: Diagnosis not present

## 2016-12-15 DIAGNOSIS — R279 Unspecified lack of coordination: Secondary | ICD-10-CM | POA: Diagnosis not present

## 2016-12-15 DIAGNOSIS — M810 Age-related osteoporosis without current pathological fracture: Secondary | ICD-10-CM | POA: Diagnosis not present

## 2016-12-15 DIAGNOSIS — F0391 Unspecified dementia with behavioral disturbance: Secondary | ICD-10-CM | POA: Diagnosis not present

## 2016-12-15 DIAGNOSIS — M6281 Muscle weakness (generalized): Secondary | ICD-10-CM | POA: Diagnosis not present

## 2016-12-16 DIAGNOSIS — R279 Unspecified lack of coordination: Secondary | ICD-10-CM | POA: Diagnosis not present

## 2016-12-16 DIAGNOSIS — M6281 Muscle weakness (generalized): Secondary | ICD-10-CM | POA: Diagnosis not present

## 2016-12-16 DIAGNOSIS — F0391 Unspecified dementia with behavioral disturbance: Secondary | ICD-10-CM | POA: Diagnosis not present

## 2016-12-16 DIAGNOSIS — M810 Age-related osteoporosis without current pathological fracture: Secondary | ICD-10-CM | POA: Diagnosis not present

## 2016-12-16 DIAGNOSIS — F3289 Other specified depressive episodes: Secondary | ICD-10-CM | POA: Diagnosis not present

## 2016-12-16 DIAGNOSIS — R41841 Cognitive communication deficit: Secondary | ICD-10-CM | POA: Diagnosis not present

## 2016-12-19 DIAGNOSIS — R41841 Cognitive communication deficit: Secondary | ICD-10-CM | POA: Diagnosis not present

## 2016-12-19 DIAGNOSIS — F0391 Unspecified dementia with behavioral disturbance: Secondary | ICD-10-CM | POA: Diagnosis not present

## 2016-12-19 DIAGNOSIS — R279 Unspecified lack of coordination: Secondary | ICD-10-CM | POA: Diagnosis not present

## 2016-12-19 DIAGNOSIS — F3289 Other specified depressive episodes: Secondary | ICD-10-CM | POA: Diagnosis not present

## 2016-12-19 DIAGNOSIS — M810 Age-related osteoporosis without current pathological fracture: Secondary | ICD-10-CM | POA: Diagnosis not present

## 2016-12-19 DIAGNOSIS — M6281 Muscle weakness (generalized): Secondary | ICD-10-CM | POA: Diagnosis not present

## 2016-12-20 ENCOUNTER — Encounter: Payer: Self-pay | Admitting: Internal Medicine

## 2016-12-20 ENCOUNTER — Non-Acute Institutional Stay (SKILLED_NURSING_FACILITY): Payer: Medicare Other | Admitting: Internal Medicine

## 2016-12-20 DIAGNOSIS — F3289 Other specified depressive episodes: Secondary | ICD-10-CM | POA: Diagnosis not present

## 2016-12-20 DIAGNOSIS — F0391 Unspecified dementia with behavioral disturbance: Secondary | ICD-10-CM | POA: Diagnosis not present

## 2016-12-20 DIAGNOSIS — Z794 Long term (current) use of insulin: Secondary | ICD-10-CM | POA: Diagnosis not present

## 2016-12-20 DIAGNOSIS — E1140 Type 2 diabetes mellitus with diabetic neuropathy, unspecified: Secondary | ICD-10-CM

## 2016-12-20 DIAGNOSIS — R41841 Cognitive communication deficit: Secondary | ICD-10-CM | POA: Diagnosis not present

## 2016-12-20 DIAGNOSIS — I10 Essential (primary) hypertension: Secondary | ICD-10-CM | POA: Diagnosis not present

## 2016-12-20 DIAGNOSIS — I4891 Unspecified atrial fibrillation: Secondary | ICD-10-CM | POA: Diagnosis not present

## 2016-12-20 DIAGNOSIS — R279 Unspecified lack of coordination: Secondary | ICD-10-CM | POA: Diagnosis not present

## 2016-12-20 DIAGNOSIS — M6281 Muscle weakness (generalized): Secondary | ICD-10-CM | POA: Diagnosis not present

## 2016-12-20 DIAGNOSIS — M810 Age-related osteoporosis without current pathological fracture: Secondary | ICD-10-CM

## 2016-12-20 NOTE — Progress Notes (Signed)
Location:   [place] Room Number: 113/W Place of Service:  SNF (31) Provider:  Jefm Bryant, MD  Patient Care Team: Celedonio Savage, MD as PCP - General (Family Medicine) Rothbart, Cristopher Estimable, MD (Cardiology) Lendon Colonel, NP as Nurse Practitioner (Nurse Practitioner)  Extended Emergency Contact Information Primary Emergency Contact: [facility] Address: [address]          [place], [place] [address] [name] of [place] Phone: [phone] Mobile Phone: [phone] Relation: Daughter  Code Status:  DNR Goals of care: Advanced Directive information Advanced Directives 12/20/2016  Does Patient Have a Medical Advance Directive? Yes  Type of Advance Directive Out of facility DNR (pink MOST or yellow form)  Does patient want to make changes to medical advance directive? No - Patient declined  Copy of [place] in Chart? -     Chief Complaint  Patient presents with  . Medical Management of Chronic Issues    Routine Visit,     HPI:  Pt is a 76 y.o. female seen today for medical management of chronic diseases.    Patient has h/o Diabetes mellitus type 2 , atrial fibrillation on chronic anticoagulation, Venous stasis, Dementia with Behavior problems, S/P fall leading to Ocular floor fracture.   Patient was recently treated for Cellulitis of her LE with Doxycycline. She also had her Lasix increased to 40 mg. Her wight has come down to 206 lbs from 212 lbs. Patient cellulitis is now resolved. And her edema is better. Patient continues to do well. Her BS are running less then 200. Mostly except some spikes in afternoon. No new Nursing issues.   Past Medical History:  Diagnosis Date  . Allergy   . Atrial fibrillation ([place]) 08/2011   First diagnosed in 08/2011; duration of arrhythmia is uncertain  . Bilateral lower extremity edema   . Chronic diarrhea    diverticulosis  . COPD (chronic obstructive pulmonary  disease) ([place])   . Dementia   . Diabetes mellitus, type 2 (HCC)    Diabetic neuropathy  . Dyspnea on exertion    pedal edema  . Gout   . Headache(784.0)    twice weekly  . Hyperlipidemia   . Hypertension   . Osteopenia    DEXA scan 01/2010  . Palpitations   . Seasonal allergies   . Stress incontinence   . Vertigo    Past Surgical History:  Procedure Laterality Date  . APPENDECTOMY    . BREAST BIOPSY  2002   Right  . CESAREAN SECTION     x 2  . CHOLECYSTECTOMY    . COLONOSCOPY  2007   Negative screening study  . HAMMER TOE SURGERY     Bilateral hammer toe amputation  . INCISIONAL HERNIA REPAIR    . KNEE ARTHROSCOPY W/ MENISCAL REPAIR  2007   Bilateral  . UMBILICAL HERNIA REPAIR      Allergies  Allergen Reactions  . Codeine Anaphylaxis and Hives  . Morphine And Related Anaphylaxis and Hives  . Penicillins Anaphylaxis  . Ace Inhibitors Cough    Outpatient Encounter Prescriptions as of 12/20/2016  Medication Sig  . acetaminophen (TYLENOL) 325 MG tablet Take 650 mg by mouth every 6 (six) hours as needed.  Marland Kitchen alendronate (FOSAMAX) 70 MG tablet Give 1 tablet by mouth on Sunday. Take with a full glass of water on an empty stomach.  . carboxymethylcellulose (REFRESH PLUS) 0.5 % SOLN Place 1 drop into both eyes 4 (  four) times daily.  . cholecalciferol (VITAMIN D) 1000 UNITS tablet Take 1,000 Units by mouth daily.  . citalopram (CELEXA) 20 MG tablet Take 1 tablet (20 mg total) by mouth daily.  . digoxin (LANOXIN) 0.125 MG tablet Take 1 tablet by mouth once a day (HOLD FOR AP UNDER 60)  . docusate sodium (COLACE) 100 MG capsule Take 200 mg by mouth daily as needed.   . furosemide (LASIX) 20 MG tablet Take 40 mg by mouth.   . guaifenesin (HUMIBID E) 400 MG TABS tablet Take 400 mg by mouth every 6 (six) hours as needed.  . insulin NPH-regular Human (NOVOLIN 70/30) (70-30) 100 UNIT/ML injection Give 30 units subcutaneous once a evening  . insulin NPH-regular Human (NOVOLIN  70/30) (70-30) 100 UNIT/ML injection Give 52 units subcutaneous one a morning  . Insulin Pen Needle (EASY TOUCH PEN NEEDLES) 31G X 8 MM MISC Use twice a day  . losartan (COZAAR) 25 MG tablet Take 25 mg by mouth daily.  . memantine (NAMENDA) 10 MG tablet Take 10 mg by mouth 2 (two) times daily.  . metFORMIN (GLUCOPHAGE) 500 MG tablet Take 500 mg by mouth 2 (two) times daily with a meal.   . metoprolol (LOPRESSOR) 100 MG tablet Take 1 tablet (100 mg total) by mouth 2 (two) times daily.  . Multiple Vitamin (MULTIVITAMIN WITH MINERALS) TABS Take 1 tablet by mouth every morning.  Marland Kitchen POTASSIUM CHLORIDE ER PO Potassium chloride extended release capsule, give 20 meq by mouth once day  . pravastatin (PRAVACHOL) 40 MG tablet Take 1 tablet (40 mg total) by mouth every evening.  . traZODone (DESYREL) 50 MG tablet Take 25 mg by mouth at bedtime. As needed for sleep.   Alveda Reasons 20 MG TABS tablet TAKE (1) TABLET BY MOUTH ONCE DAILY.   No facility-administered encounter medications on file as of 12/20/2016.      Review of Systems  Unable to perform ROS: Dementia    Immunization History  Administered Date(s) Administered  . Influenza-Unspecified 02/04/2016  . Pneumococcal-Unspecified 02/08/2016   Pertinent  Health Maintenance Due  Topic Date Due  . FOOT EXAM  01/20/2017 (Originally 03/18/1951)  . OPHTHALMOLOGY EXAM  01/20/2017 (Originally 03/18/1951)  . DEXA SCAN  01/20/2017 (Originally 03/18/2005)  . INFLUENZA VACCINE  04/01/2017 (Originally 11/30/2016)  . PNA vac Low Risk Adult (2 of 2 - PCV13) 02/07/2017  . URINE MICROALBUMIN  05/11/2017  . HEMOGLOBIN A1C  05/31/2017   Fall Risk  11/06/2015 09/29/2015 08/31/2015 06/30/2015 02/24/2015  Falls in the past year? Yes No No No Yes  Number falls in past yr: 2 or more - - - 2 or more  Injury with Fall? No - - - No   Functional Status Survey:    Vitals:   12/20/16 1108  BP: 119/70  Pulse: 71  Resp: 20  Temp: (!) 97 F (36.1 C)  TempSrc: Oral    SpO2: 97%  Weight: 206 lb 6.4 oz (93.6 kg)  Height: 5\' 6"  (1.676 m)   Body mass index is 33.31 kg/m. Physical Exam  Constitutional: She appears well-developed and well-nourished.  HENT:  Head: Normocephalic.  Mouth/Throat: Oropharynx is clear and moist.  Eyes: Pupils are equal, round, and reactive to light.  Neck: Neck supple.  Cardiovascular: Normal rate and normal heart sounds.   Pulmonary/Chest: Effort normal and breath sounds normal. No respiratory distress. She has no wheezes. She has no rales.  Abdominal: Soft. Bowel sounds are normal. She exhibits no distension. There is no  tenderness. There is no rebound.  Musculoskeletal:  Moderate edema, bilateral  Neurological: She is alert.  Skin: Skin is warm and dry.  Psychiatric: She has a normal mood and affect. Her behavior is normal.    Labs reviewed:  Recent Labs  10/18/16 0718 11/14/16 0730 11/28/16 0700  NA 141 141 141  K 4.5 4.5 4.4  CL 105 104 102  CO2 30 30 31   GLUCOSE 90 104* 86  BUN 26* 26* 26*  CREATININE 0.93 1.00 0.94  CALCIUM 8.8* 9.3 9.2    Recent Labs  01/15/16 0710 05/12/16 0500 11/14/16 0730  AST 20 13* 15  ALT 16 13* 15  ALKPHOS 61 49 48  BILITOT 0.6 0.7 0.7  PROT 7.6 6.6 7.2  ALBUMIN 3.3* 2.9* 3.2*    Recent Labs  01/15/16 0500  08/23/16 0700 11/14/16 0730 11/28/16 0700  WBC 7.1  < > 7.9 6.6 8.3  NEUTROABS 4.0  --   --  3.6  --   HGB 14.0  < > 13.7 12.6 13.1  HCT 44.3  < > 43.0 39.4 41.1  MCV 94.7  < > 92.3 92.7 93.0  PLT 194  < > 234 231 249  < > = values in this interval not displayed. Lab Results  Component Value Date   TSH 2.936 09/09/2011   Lab Results  Component Value Date   HGBA1C 6.5 (H) 11/28/2016   Lab Results  Component Value Date   CHOL 129 05/12/2016   HDL 35 (L) 05/12/2016   LDLCALC 79 05/12/2016   TRIG 73 05/12/2016   CHOLHDL 3.7 05/12/2016    Significant Diagnostic Results in last 30 days:  No results found.  Assessment/Plan Atrial fibrillation  with rapid ventricular response  Doing well on On Lopressor, Digoxin and Xarelto. Dig level was low when checked on 05/18 No recent falls or Injuries reported.  Essential hypertension BP well controlled  Diabetes mellitus Patient last A1C was 6.5 in 04/18 Her BS are well controlled. She did have microalbuminuria Is on Losartan and has tolerated well.  Increase in LE edema with Weight gain Continue on Lasix 40 mg Will recheck her renal function  Dementia with behavioral disturbance,  Doing well on Namenda and celexa Will try to taper her off trazadone as she has been doing well.  Osteoporosis  She was started on Fosamax and is doing well. Hyperlipidemia LDL less then 100. On Pravachol.  Family/ staff Communication:   Labs/tests ordered:  BMP, fasting Lipid, TSH  Total time spent in this patient care encounter was 25_ minutes; greater than 50% of the visit spent counseling patient, reviewing records , Labs and coordinating care for problems addressed at this encounter.

## 2016-12-21 ENCOUNTER — Encounter (HOSPITAL_COMMUNITY)
Admission: RE | Admit: 2016-12-21 | Discharge: 2016-12-21 | Disposition: A | Discharge status: Home / Self Care | Payer: Medicare Other | Source: Skilled Nursing Facility | Attending: Internal Medicine | Admitting: Internal Medicine

## 2016-12-21 DIAGNOSIS — R41841 Cognitive communication deficit: Secondary | ICD-10-CM | POA: Diagnosis not present

## 2016-12-21 DIAGNOSIS — R279 Unspecified lack of coordination: Secondary | ICD-10-CM | POA: Insufficient documentation

## 2016-12-21 DIAGNOSIS — F3289 Other specified depressive episodes: Secondary | ICD-10-CM | POA: Diagnosis not present

## 2016-12-21 DIAGNOSIS — F0391 Unspecified dementia with behavioral disturbance: Secondary | ICD-10-CM | POA: Diagnosis not present

## 2016-12-21 DIAGNOSIS — M6281 Muscle weakness (generalized): Secondary | ICD-10-CM | POA: Diagnosis not present

## 2016-12-21 DIAGNOSIS — I5042 Chronic combined systolic (congestive) and diastolic (congestive) heart failure: Secondary | ICD-10-CM | POA: Insufficient documentation

## 2016-12-21 DIAGNOSIS — M810 Age-related osteoporosis without current pathological fracture: Secondary | ICD-10-CM | POA: Insufficient documentation

## 2016-12-21 DIAGNOSIS — L0390 Cellulitis, unspecified: Secondary | ICD-10-CM | POA: Insufficient documentation

## 2016-12-21 LAB — BASIC METABOLIC PANEL
Anion gap: 5 (ref 5–15)
BUN: 24 mg/dL — AB (ref 6–20)
CALCIUM: 9 mg/dL (ref 8.9–10.3)
CHLORIDE: 103 mmol/L (ref 101–111)
CO2: 30 mmol/L (ref 22–32)
CREATININE: 0.97 mg/dL (ref 0.44–1.00)
GFR calc non Af Amer: 55 mL/min — ABNORMAL LOW (ref >60)
Glucose, Bld: 92 mg/dL (ref 65–99)
Potassium: 4.1 mmol/L (ref 3.5–5.1)
SODIUM: 138 mmol/L (ref 135–145)

## 2016-12-21 LAB — TSH: TSH: 3.039 u[IU]/mL (ref 0.350–4.500)

## 2016-12-21 LAB — LIPID PANEL
CHOLESTEROL: 143 mg/dL (ref 0–200)
HDL: 36 mg/dL — ABNORMAL LOW (ref >40)
LDL CALC: 92 mg/dL (ref 0–99)
Total CHOL/HDL Ratio: 4 RATIO
Triglycerides: 77 mg/dL (ref <150)
VLDL: 15 mg/dL (ref 0–40)

## 2016-12-22 DIAGNOSIS — F3289 Other specified depressive episodes: Secondary | ICD-10-CM | POA: Diagnosis not present

## 2016-12-22 DIAGNOSIS — F0391 Unspecified dementia with behavioral disturbance: Secondary | ICD-10-CM | POA: Diagnosis not present

## 2016-12-22 DIAGNOSIS — R41841 Cognitive communication deficit: Secondary | ICD-10-CM | POA: Diagnosis not present

## 2016-12-22 DIAGNOSIS — M6281 Muscle weakness (generalized): Secondary | ICD-10-CM | POA: Diagnosis not present

## 2016-12-22 DIAGNOSIS — R279 Unspecified lack of coordination: Secondary | ICD-10-CM | POA: Diagnosis not present

## 2016-12-22 DIAGNOSIS — M810 Age-related osteoporosis without current pathological fracture: Secondary | ICD-10-CM | POA: Diagnosis not present

## 2016-12-22 LAB — VITAMIN D 25 HYDROXY (VIT D DEFICIENCY, FRACTURES): Vit D, 25-Hydroxy: 30.4 ng/mL (ref 30.0–100.0)

## 2016-12-23 DIAGNOSIS — M6281 Muscle weakness (generalized): Secondary | ICD-10-CM | POA: Diagnosis not present

## 2016-12-23 DIAGNOSIS — M810 Age-related osteoporosis without current pathological fracture: Secondary | ICD-10-CM | POA: Diagnosis not present

## 2016-12-23 DIAGNOSIS — F0391 Unspecified dementia with behavioral disturbance: Secondary | ICD-10-CM | POA: Diagnosis not present

## 2016-12-23 DIAGNOSIS — R41841 Cognitive communication deficit: Secondary | ICD-10-CM | POA: Diagnosis not present

## 2016-12-23 DIAGNOSIS — F3289 Other specified depressive episodes: Secondary | ICD-10-CM | POA: Diagnosis not present

## 2016-12-23 DIAGNOSIS — R279 Unspecified lack of coordination: Secondary | ICD-10-CM | POA: Diagnosis not present

## 2016-12-24 DIAGNOSIS — F3289 Other specified depressive episodes: Secondary | ICD-10-CM | POA: Diagnosis not present

## 2016-12-24 DIAGNOSIS — M6281 Muscle weakness (generalized): Secondary | ICD-10-CM | POA: Diagnosis not present

## 2016-12-24 DIAGNOSIS — F0391 Unspecified dementia with behavioral disturbance: Secondary | ICD-10-CM | POA: Diagnosis not present

## 2016-12-24 DIAGNOSIS — R279 Unspecified lack of coordination: Secondary | ICD-10-CM | POA: Diagnosis not present

## 2016-12-24 DIAGNOSIS — R41841 Cognitive communication deficit: Secondary | ICD-10-CM | POA: Diagnosis not present

## 2016-12-24 DIAGNOSIS — M810 Age-related osteoporosis without current pathological fracture: Secondary | ICD-10-CM | POA: Diagnosis not present

## 2016-12-26 DIAGNOSIS — M810 Age-related osteoporosis without current pathological fracture: Secondary | ICD-10-CM | POA: Diagnosis not present

## 2016-12-26 DIAGNOSIS — M6281 Muscle weakness (generalized): Secondary | ICD-10-CM | POA: Diagnosis not present

## 2016-12-26 DIAGNOSIS — F3289 Other specified depressive episodes: Secondary | ICD-10-CM | POA: Diagnosis not present

## 2016-12-26 DIAGNOSIS — R41841 Cognitive communication deficit: Secondary | ICD-10-CM | POA: Diagnosis not present

## 2016-12-26 DIAGNOSIS — F0391 Unspecified dementia with behavioral disturbance: Secondary | ICD-10-CM | POA: Diagnosis not present

## 2016-12-26 DIAGNOSIS — R279 Unspecified lack of coordination: Secondary | ICD-10-CM | POA: Diagnosis not present

## 2016-12-27 DIAGNOSIS — F0391 Unspecified dementia with behavioral disturbance: Secondary | ICD-10-CM | POA: Diagnosis not present

## 2016-12-27 DIAGNOSIS — R279 Unspecified lack of coordination: Secondary | ICD-10-CM | POA: Diagnosis not present

## 2016-12-27 DIAGNOSIS — F3289 Other specified depressive episodes: Secondary | ICD-10-CM | POA: Diagnosis not present

## 2016-12-27 DIAGNOSIS — R41841 Cognitive communication deficit: Secondary | ICD-10-CM | POA: Diagnosis not present

## 2016-12-27 DIAGNOSIS — M6281 Muscle weakness (generalized): Secondary | ICD-10-CM | POA: Diagnosis not present

## 2016-12-27 DIAGNOSIS — M810 Age-related osteoporosis without current pathological fracture: Secondary | ICD-10-CM | POA: Diagnosis not present

## 2016-12-28 DIAGNOSIS — R41841 Cognitive communication deficit: Secondary | ICD-10-CM | POA: Diagnosis not present

## 2016-12-28 DIAGNOSIS — F3289 Other specified depressive episodes: Secondary | ICD-10-CM | POA: Diagnosis not present

## 2016-12-28 DIAGNOSIS — R279 Unspecified lack of coordination: Secondary | ICD-10-CM | POA: Diagnosis not present

## 2016-12-28 DIAGNOSIS — M6281 Muscle weakness (generalized): Secondary | ICD-10-CM | POA: Diagnosis not present

## 2016-12-28 DIAGNOSIS — M810 Age-related osteoporosis without current pathological fracture: Secondary | ICD-10-CM | POA: Diagnosis not present

## 2016-12-28 DIAGNOSIS — F0391 Unspecified dementia with behavioral disturbance: Secondary | ICD-10-CM | POA: Diagnosis not present

## 2016-12-29 DIAGNOSIS — F3289 Other specified depressive episodes: Secondary | ICD-10-CM | POA: Diagnosis not present

## 2016-12-29 DIAGNOSIS — M810 Age-related osteoporosis without current pathological fracture: Secondary | ICD-10-CM | POA: Diagnosis not present

## 2016-12-29 DIAGNOSIS — M6281 Muscle weakness (generalized): Secondary | ICD-10-CM | POA: Diagnosis not present

## 2016-12-29 DIAGNOSIS — R41841 Cognitive communication deficit: Secondary | ICD-10-CM | POA: Diagnosis not present

## 2016-12-29 DIAGNOSIS — F0391 Unspecified dementia with behavioral disturbance: Secondary | ICD-10-CM | POA: Diagnosis not present

## 2016-12-29 DIAGNOSIS — R279 Unspecified lack of coordination: Secondary | ICD-10-CM | POA: Diagnosis not present

## 2016-12-30 DIAGNOSIS — M6281 Muscle weakness (generalized): Secondary | ICD-10-CM | POA: Diagnosis not present

## 2016-12-30 DIAGNOSIS — R41841 Cognitive communication deficit: Secondary | ICD-10-CM | POA: Diagnosis not present

## 2016-12-30 DIAGNOSIS — M810 Age-related osteoporosis without current pathological fracture: Secondary | ICD-10-CM | POA: Diagnosis not present

## 2016-12-30 DIAGNOSIS — F0391 Unspecified dementia with behavioral disturbance: Secondary | ICD-10-CM | POA: Diagnosis not present

## 2016-12-30 DIAGNOSIS — R279 Unspecified lack of coordination: Secondary | ICD-10-CM | POA: Diagnosis not present

## 2016-12-30 DIAGNOSIS — F3289 Other specified depressive episodes: Secondary | ICD-10-CM | POA: Diagnosis not present

## 2017-01-02 DIAGNOSIS — F0391 Unspecified dementia with behavioral disturbance: Secondary | ICD-10-CM | POA: Diagnosis not present

## 2017-01-02 DIAGNOSIS — R279 Unspecified lack of coordination: Secondary | ICD-10-CM | POA: Diagnosis not present

## 2017-01-02 DIAGNOSIS — R41841 Cognitive communication deficit: Secondary | ICD-10-CM | POA: Diagnosis not present

## 2017-01-02 DIAGNOSIS — F3289 Other specified depressive episodes: Secondary | ICD-10-CM | POA: Diagnosis not present

## 2017-01-02 DIAGNOSIS — M6281 Muscle weakness (generalized): Secondary | ICD-10-CM | POA: Diagnosis not present

## 2017-01-02 DIAGNOSIS — M810 Age-related osteoporosis without current pathological fracture: Secondary | ICD-10-CM | POA: Diagnosis not present

## 2017-01-03 DIAGNOSIS — M810 Age-related osteoporosis without current pathological fracture: Secondary | ICD-10-CM | POA: Diagnosis not present

## 2017-01-03 DIAGNOSIS — F3289 Other specified depressive episodes: Secondary | ICD-10-CM | POA: Diagnosis not present

## 2017-01-03 DIAGNOSIS — M6281 Muscle weakness (generalized): Secondary | ICD-10-CM | POA: Diagnosis not present

## 2017-01-03 DIAGNOSIS — R279 Unspecified lack of coordination: Secondary | ICD-10-CM | POA: Diagnosis not present

## 2017-01-03 DIAGNOSIS — R41841 Cognitive communication deficit: Secondary | ICD-10-CM | POA: Diagnosis not present

## 2017-01-03 DIAGNOSIS — F0391 Unspecified dementia with behavioral disturbance: Secondary | ICD-10-CM | POA: Diagnosis not present

## 2017-01-04 DIAGNOSIS — F3289 Other specified depressive episodes: Secondary | ICD-10-CM | POA: Diagnosis not present

## 2017-01-04 DIAGNOSIS — M810 Age-related osteoporosis without current pathological fracture: Secondary | ICD-10-CM | POA: Diagnosis not present

## 2017-01-04 DIAGNOSIS — F0391 Unspecified dementia with behavioral disturbance: Secondary | ICD-10-CM | POA: Diagnosis not present

## 2017-01-04 DIAGNOSIS — M6281 Muscle weakness (generalized): Secondary | ICD-10-CM | POA: Diagnosis not present

## 2017-01-04 DIAGNOSIS — R279 Unspecified lack of coordination: Secondary | ICD-10-CM | POA: Diagnosis not present

## 2017-01-04 DIAGNOSIS — R41841 Cognitive communication deficit: Secondary | ICD-10-CM | POA: Diagnosis not present

## 2017-01-05 DIAGNOSIS — R279 Unspecified lack of coordination: Secondary | ICD-10-CM | POA: Diagnosis not present

## 2017-01-05 DIAGNOSIS — R41841 Cognitive communication deficit: Secondary | ICD-10-CM | POA: Diagnosis not present

## 2017-01-05 DIAGNOSIS — M6281 Muscle weakness (generalized): Secondary | ICD-10-CM | POA: Diagnosis not present

## 2017-01-05 DIAGNOSIS — F0391 Unspecified dementia with behavioral disturbance: Secondary | ICD-10-CM | POA: Diagnosis not present

## 2017-01-05 DIAGNOSIS — F3289 Other specified depressive episodes: Secondary | ICD-10-CM | POA: Diagnosis not present

## 2017-01-05 DIAGNOSIS — M810 Age-related osteoporosis without current pathological fracture: Secondary | ICD-10-CM | POA: Diagnosis not present

## 2017-01-06 DIAGNOSIS — F0391 Unspecified dementia with behavioral disturbance: Secondary | ICD-10-CM | POA: Diagnosis not present

## 2017-01-06 DIAGNOSIS — M6281 Muscle weakness (generalized): Secondary | ICD-10-CM | POA: Diagnosis not present

## 2017-01-06 DIAGNOSIS — F3289 Other specified depressive episodes: Secondary | ICD-10-CM | POA: Diagnosis not present

## 2017-01-06 DIAGNOSIS — R41841 Cognitive communication deficit: Secondary | ICD-10-CM | POA: Diagnosis not present

## 2017-01-06 DIAGNOSIS — R279 Unspecified lack of coordination: Secondary | ICD-10-CM | POA: Diagnosis not present

## 2017-01-06 DIAGNOSIS — M810 Age-related osteoporosis without current pathological fracture: Secondary | ICD-10-CM | POA: Diagnosis not present

## 2017-01-07 DIAGNOSIS — R279 Unspecified lack of coordination: Secondary | ICD-10-CM | POA: Diagnosis not present

## 2017-01-07 DIAGNOSIS — F0391 Unspecified dementia with behavioral disturbance: Secondary | ICD-10-CM | POA: Diagnosis not present

## 2017-01-07 DIAGNOSIS — M810 Age-related osteoporosis without current pathological fracture: Secondary | ICD-10-CM | POA: Diagnosis not present

## 2017-01-07 DIAGNOSIS — F3289 Other specified depressive episodes: Secondary | ICD-10-CM | POA: Diagnosis not present

## 2017-01-07 DIAGNOSIS — R41841 Cognitive communication deficit: Secondary | ICD-10-CM | POA: Diagnosis not present

## 2017-01-07 DIAGNOSIS — M6281 Muscle weakness (generalized): Secondary | ICD-10-CM | POA: Diagnosis not present

## 2017-01-09 DIAGNOSIS — M6281 Muscle weakness (generalized): Secondary | ICD-10-CM | POA: Diagnosis not present

## 2017-01-09 DIAGNOSIS — M810 Age-related osteoporosis without current pathological fracture: Secondary | ICD-10-CM | POA: Diagnosis not present

## 2017-01-09 DIAGNOSIS — F3289 Other specified depressive episodes: Secondary | ICD-10-CM | POA: Diagnosis not present

## 2017-01-09 DIAGNOSIS — R41841 Cognitive communication deficit: Secondary | ICD-10-CM | POA: Diagnosis not present

## 2017-01-09 DIAGNOSIS — R279 Unspecified lack of coordination: Secondary | ICD-10-CM | POA: Diagnosis not present

## 2017-01-09 DIAGNOSIS — F0391 Unspecified dementia with behavioral disturbance: Secondary | ICD-10-CM | POA: Diagnosis not present

## 2017-01-10 ENCOUNTER — Encounter: Payer: Self-pay | Admitting: Internal Medicine

## 2017-01-10 ENCOUNTER — Non-Acute Institutional Stay (SKILLED_NURSING_FACILITY): Payer: Medicare Other | Admitting: Internal Medicine

## 2017-01-10 DIAGNOSIS — F0391 Unspecified dementia with behavioral disturbance: Secondary | ICD-10-CM | POA: Diagnosis not present

## 2017-01-10 DIAGNOSIS — R279 Unspecified lack of coordination: Secondary | ICD-10-CM | POA: Diagnosis not present

## 2017-01-10 DIAGNOSIS — I4891 Unspecified atrial fibrillation: Secondary | ICD-10-CM | POA: Diagnosis not present

## 2017-01-10 DIAGNOSIS — Z794 Long term (current) use of insulin: Secondary | ICD-10-CM

## 2017-01-10 DIAGNOSIS — R600 Localized edema: Secondary | ICD-10-CM | POA: Diagnosis not present

## 2017-01-10 DIAGNOSIS — E1140 Type 2 diabetes mellitus with diabetic neuropathy, unspecified: Secondary | ICD-10-CM | POA: Diagnosis not present

## 2017-01-10 DIAGNOSIS — F3289 Other specified depressive episodes: Secondary | ICD-10-CM | POA: Diagnosis not present

## 2017-01-10 DIAGNOSIS — M810 Age-related osteoporosis without current pathological fracture: Secondary | ICD-10-CM | POA: Diagnosis not present

## 2017-01-10 DIAGNOSIS — M6281 Muscle weakness (generalized): Secondary | ICD-10-CM | POA: Diagnosis not present

## 2017-01-10 DIAGNOSIS — R41841 Cognitive communication deficit: Secondary | ICD-10-CM | POA: Diagnosis not present

## 2017-01-10 NOTE — Progress Notes (Signed)
Location:   [place] Room Number: 113/W Place of Service:  SNF (31) Provider:  Jefm Bryant, MD  Patient Care Team: Celedonio Savage, MD as PCP - General (Family Medicine) Rothbart, Cristopher Estimable, MD (Cardiology) Lendon Colonel, NP as Nurse Practitioner (Nurse Practitioner)  Extended Emergency Contact Information Primary Emergency Contact: [facility] Address: [address]          [place], [place] [address] [name] of [place] Phone: [phone] Mobile Phone: [phone] Relation: Daughter  Code Status:  DNR Goals of care: Advanced Directive information Advanced Directives 01/10/2017  Does Patient Have a Medical Advance Directive? Yes  Type of Advance Directive Out of facility DNR (pink MOST or yellow form)  Does patient want to make changes to medical advance directive? No - Patient declined  Copy of [place] in Chart? -     Chief Complaint  Patient presents with  . Acute Visit    Leg Swelling    HPI:  Pt is a 76 y.o. female seen today for an acute visit for worsening swelling of her Legs right more then left. Patient has h/o Diabetes mellitus type 2 , atrial fibrillation on chronic anticoagulation, Venous stasis, Dementia with Behavior problems, S/P fall leading to Ocular floor fracture.   She was treated for Cellulitis of right leg in 07/18 with Doxycycline. Her lasix was also increased to 40 mg. Her weight did come down from 212 to 206 lbs. But now her swelling is worse and today she also was having pain especially in her Right leg. She also has some redness around right leg. No fever or chills. She denies any cough or chest pain. She is unreliable due to her dementia. Her weight is 207 lbs   Past Medical History:  Diagnosis Date  . Allergy   . Atrial fibrillation ([place]) 08/2011   First diagnosed in 08/2011; duration of arrhythmia is uncertain  . Bilateral lower extremity edema   . Chronic  diarrhea    diverticulosis  . COPD (chronic obstructive pulmonary disease) ([place])   . Dementia   . Diabetes mellitus, type 2 (HCC)    Diabetic neuropathy  . Dyspnea on exertion    pedal edema  . Gout   . Headache(784.0)    twice weekly  . Hyperlipidemia   . Hypertension   . Osteopenia    DEXA scan 01/2010  . Palpitations   . Seasonal allergies   . Stress incontinence   . Vertigo    Past Surgical History:  Procedure Laterality Date  . APPENDECTOMY    . BREAST BIOPSY  2002   Right  . CESAREAN SECTION     x 2  . CHOLECYSTECTOMY    . COLONOSCOPY  2007   Negative screening study  . HAMMER TOE SURGERY     Bilateral hammer toe amputation  . INCISIONAL HERNIA REPAIR    . KNEE ARTHROSCOPY W/ MENISCAL REPAIR  2007   Bilateral  . UMBILICAL HERNIA REPAIR      Allergies  Allergen Reactions  . Codeine Anaphylaxis and Hives  . Morphine And Related Anaphylaxis and Hives  . Penicillins Anaphylaxis  . Ace Inhibitors Cough    Outpatient Encounter Prescriptions as of 01/10/2017  Medication Sig  . acetaminophen (TYLENOL) 325 MG tablet Take 650 mg by mouth every 6 (six) hours as needed.  Marland Kitchen alendronate (FOSAMAX) 70 MG tablet Give 1 tablet by mouth on Sunday. Take with a full glass of water on an  empty stomach.  . carboxymethylcellulose (REFRESH PLUS) 0.5 % SOLN Place 1 drop into both eyes 4 (four) times daily.  . cholecalciferol (VITAMIN D) 1000 UNITS tablet Take 1,000 Units by mouth daily.  . citalopram (CELEXA) 20 MG tablet Take 1 tablet (20 mg total) by mouth daily.  . digoxin (LANOXIN) 0.125 MG tablet Take 1 tablet by mouth once a day (HOLD FOR AP UNDER 60)  . docusate sodium (COLACE) 100 MG capsule Take 200 mg by mouth daily as needed.   . furosemide (LASIX) 20 MG tablet Take 40 mg by mouth.   . guaifenesin (HUMIBID E) 400 MG TABS tablet Take 400 mg by mouth every 6 (six) hours as needed.  . insulin NPH-regular Human (NOVOLIN 70/30) (70-30) 100 UNIT/ML injection Give 30 units  subcutaneous once a evening  . insulin NPH-regular Human (NOVOLIN 70/30) (70-30) 100 UNIT/ML injection Give 52 units subcutaneous one a morning  . Insulin Pen Needle (EASY TOUCH PEN NEEDLES) 31G X 8 MM MISC Use twice a day  . losartan (COZAAR) 25 MG tablet Take 25 mg by mouth daily.  . memantine (NAMENDA) 10 MG tablet Take 10 mg by mouth 2 (two) times daily.  . metFORMIN (GLUCOPHAGE) 500 MG tablet Take 500 mg by mouth 2 (two) times daily with a meal.   . metoprolol (LOPRESSOR) 100 MG tablet Take 1 tablet (100 mg total) by mouth 2 (two) times daily.  . Multiple Vitamin (MULTIVITAMIN WITH MINERALS) TABS Take 1 tablet by mouth every morning.  Marland Kitchen POTASSIUM CHLORIDE ER PO Potassium chloride extended release capsule, give 20 meq by mouth once day  . pravastatin (PRAVACHOL) 40 MG tablet Take 1 tablet (40 mg total) by mouth every evening.  Alveda Reasons 20 MG TABS tablet TAKE (1) TABLET BY MOUTH ONCE DAILY.  . [DISCONTINUED] traZODone (DESYREL) 50 MG tablet Take 25 mg by mouth at bedtime. As needed for sleep.    No facility-administered encounter medications on file as of 01/10/2017.      Review of Systems  Unable to perform ROS: Dementia    Immunization History  Administered Date(s) Administered  . Influenza-Unspecified 02/04/2016  . Pneumococcal-Unspecified 02/08/2016   Pertinent  Health Maintenance Due  Topic Date Due  . FOOT EXAM  01/20/2017 (Originally 03/18/1951)  . OPHTHALMOLOGY EXAM  01/20/2017 (Originally 03/18/1951)  . INFLUENZA VACCINE  04/01/2017 (Originally 11/30/2016)  . PNA vac Low Risk Adult (2 of 2 - PCV13) 02/07/2017  . URINE MICROALBUMIN  05/11/2017  . HEMOGLOBIN A1C  05/31/2017  . DEXA SCAN  Completed   Fall Risk  11/06/2015 09/29/2015 08/31/2015 06/30/2015 02/24/2015  Falls in the past year? Yes No No No Yes  Number falls in past yr: 2 or more - - - 2 or more  Injury with Fall? No - - - No   Functional Status Survey:    Vitals:   01/10/17 1135  BP: 132/81  Pulse: 87    Resp: 20  Temp: 97.9 F (36.6 C)  TempSrc: Oral   There is no height or weight on file to calculate BMI. Physical Exam  Constitutional: She appears well-developed and well-nourished.  HENT:  Head: Normocephalic.  Mouth/Throat: Oropharynx is clear and moist.  Eyes: Pupils are equal, round, and reactive to light.  Cardiovascular: Normal rate.  An irregular rhythm present.  Pulmonary/Chest: Effort normal and breath sounds normal. No respiratory distress. She has no wheezes. She has no rales.  Abdominal: Soft. Bowel sounds are normal. She exhibits no distension. There is no tenderness.  There is no rebound.  Musculoskeletal:  Has Significant Edema in Both LE with Right more then Left. Very tender to touch especially right leg. Redness around the Right LE.  Neurological: She is alert.  Skin: Skin is warm and dry.  Psychiatric: She has a normal mood and affect. Her behavior is normal.    Labs reviewed:  Recent Labs  11/14/16 0730 11/28/16 0700 12/21/16 0706  NA 141 141 138  K 4.5 4.4 4.1  CL 104 102 103  CO2 30 31 30   GLUCOSE 104* 86 92  BUN 26* 26* 24*  CREATININE 1.00 0.94 0.97  CALCIUM 9.3 9.2 9.0    Recent Labs  01/15/16 0710 05/12/16 0500 11/14/16 0730  AST 20 13* 15  ALT 16 13* 15  ALKPHOS 61 49 48  BILITOT 0.6 0.7 0.7  PROT 7.6 6.6 7.2  ALBUMIN 3.3* 2.9* 3.2*    Recent Labs  01/15/16 0500  08/23/16 0700 11/14/16 0730 11/28/16 0700  WBC 7.1  < > 7.9 6.6 8.3  NEUTROABS 4.0  --   --  3.6  --   HGB 14.0  < > 13.7 12.6 13.1  HCT 44.3  < > 43.0 39.4 41.1  MCV 94.7  < > 92.3 92.7 93.0  PLT 194  < > 234 231 249  < > = values in this interval not displayed. Lab Results  Component Value Date   TSH 3.039 12/21/2016   Lab Results  Component Value Date   HGBA1C 6.5 (H) 11/28/2016   Lab Results  Component Value Date   CHOL 143 12/21/2016   HDL 36 (L) 12/21/2016   LDLCALC 92 12/21/2016   TRIG 77 12/21/2016   CHOLHDL 4.0 12/21/2016    Significant  Diagnostic Results in last 30 days:  No results found.  Assessment/Plan  Bilateral LE Edema  Will get dopplers of both legs though patient is on Xarelto increase lasix to 60 mg. Will also start her on Doxycycline again. Consider cardiology referral if above work up is negative Repeat BMP Daily Weights  Chronic Atrial fibrillation  Doing well on On Lopressor, Digoxin and Xarelto.  Diabetes mellitus Patient last A1C was 6.5 in 07/18 But her BS have been running high in afternoon. Can be due to Infection Will cover with Sliding scale for now.  Dementia with behavioral disturbance,  Doing well on Namenda and Celexa Essential hypertension BP well controlled  Family/ staff Communication:   Labs/tests ordered:  BMP And CBC Dopplers  Total time spent in this patient care encounter was _ 74minutes; greater than 50% of the visit spent counseling patient, reviewing records , Labs and coordinating care for problems addressed at this encounter.

## 2017-01-11 DIAGNOSIS — M6281 Muscle weakness (generalized): Secondary | ICD-10-CM | POA: Diagnosis not present

## 2017-01-11 DIAGNOSIS — F0391 Unspecified dementia with behavioral disturbance: Secondary | ICD-10-CM | POA: Diagnosis not present

## 2017-01-11 DIAGNOSIS — R41841 Cognitive communication deficit: Secondary | ICD-10-CM | POA: Diagnosis not present

## 2017-01-11 DIAGNOSIS — M810 Age-related osteoporosis without current pathological fracture: Secondary | ICD-10-CM | POA: Diagnosis not present

## 2017-01-11 DIAGNOSIS — R279 Unspecified lack of coordination: Secondary | ICD-10-CM | POA: Diagnosis not present

## 2017-01-11 DIAGNOSIS — F3289 Other specified depressive episodes: Secondary | ICD-10-CM | POA: Diagnosis not present

## 2017-01-12 ENCOUNTER — Encounter: Payer: Self-pay | Admitting: Internal Medicine

## 2017-01-12 ENCOUNTER — Non-Acute Institutional Stay (SKILLED_NURSING_FACILITY): Payer: Medicare Other | Admitting: Internal Medicine

## 2017-01-12 ENCOUNTER — Encounter (HOSPITAL_COMMUNITY)
Admission: RE | Admit: 2017-01-12 | Discharge: 2017-01-12 | Disposition: A | Discharge status: Home / Self Care | Payer: Medicare Other | Source: Skilled Nursing Facility | Attending: Internal Medicine | Admitting: Internal Medicine

## 2017-01-12 DIAGNOSIS — R279 Unspecified lack of coordination: Secondary | ICD-10-CM | POA: Diagnosis not present

## 2017-01-12 DIAGNOSIS — I5042 Chronic combined systolic (congestive) and diastolic (congestive) heart failure: Secondary | ICD-10-CM | POA: Diagnosis not present

## 2017-01-12 DIAGNOSIS — M6281 Muscle weakness (generalized): Secondary | ICD-10-CM | POA: Diagnosis not present

## 2017-01-12 DIAGNOSIS — R41841 Cognitive communication deficit: Secondary | ICD-10-CM | POA: Diagnosis not present

## 2017-01-12 DIAGNOSIS — I4891 Unspecified atrial fibrillation: Secondary | ICD-10-CM | POA: Diagnosis not present

## 2017-01-12 DIAGNOSIS — F3289 Other specified depressive episodes: Secondary | ICD-10-CM | POA: Diagnosis not present

## 2017-01-12 DIAGNOSIS — L0390 Cellulitis, unspecified: Secondary | ICD-10-CM | POA: Insufficient documentation

## 2017-01-12 DIAGNOSIS — E1140 Type 2 diabetes mellitus with diabetic neuropathy, unspecified: Secondary | ICD-10-CM | POA: Diagnosis not present

## 2017-01-12 DIAGNOSIS — F0391 Unspecified dementia with behavioral disturbance: Secondary | ICD-10-CM | POA: Diagnosis not present

## 2017-01-12 DIAGNOSIS — Z794 Long term (current) use of insulin: Secondary | ICD-10-CM | POA: Diagnosis not present

## 2017-01-12 DIAGNOSIS — M810 Age-related osteoporosis without current pathological fracture: Secondary | ICD-10-CM | POA: Diagnosis not present

## 2017-01-12 DIAGNOSIS — R600 Localized edema: Secondary | ICD-10-CM | POA: Diagnosis not present

## 2017-01-12 DIAGNOSIS — M79606 Pain in leg, unspecified: Secondary | ICD-10-CM | POA: Diagnosis not present

## 2017-01-12 LAB — BASIC METABOLIC PANEL
Anion gap: 8 (ref 5–15)
BUN: 30 mg/dL — AB (ref 6–20)
CALCIUM: 9.2 mg/dL (ref 8.9–10.3)
CO2: 27 mmol/L (ref 22–32)
Chloride: 102 mmol/L (ref 101–111)
Creatinine, Ser: 0.93 mg/dL (ref 0.44–1.00)
GFR calc Af Amer: >60 mL/min (ref >60)
GFR, EST NON AFRICAN AMERICAN: 58 mL/min — AB (ref >60)
GLUCOSE: 154 mg/dL — AB (ref 65–99)
Potassium: 3.8 mmol/L (ref 3.5–5.1)
Sodium: 137 mmol/L (ref 135–145)

## 2017-01-12 LAB — CBC
HCT: 40 % (ref 36.0–46.0)
Hemoglobin: 12.9 g/dL (ref 12.0–15.0)
MCH: 29.8 pg (ref 26.0–34.0)
MCHC: 32.3 g/dL (ref 30.0–36.0)
MCV: 92.4 fL (ref 78.0–100.0)
PLATELETS: 217 10*3/uL (ref 150–400)
RBC: 4.33 MIL/uL (ref 3.87–5.11)
RDW: 14 % (ref 11.5–15.5)
WBC: 8.4 10*3/uL (ref 4.0–10.5)

## 2017-01-12 NOTE — Progress Notes (Signed)
Location:   [facility] Room Number: 113/W Place of Service:  SNF [phone]) Provider:  Kallie Locks, MD  Patient Care Team: Celedonio Savage, MD as PCP - General (Family Medicine) Rothbart, Cristopher Estimable, MD (Cardiology) Lendon Colonel, NP as Nurse Practitioner (Nurse Practitioner)  Extended Emergency Contact Information Primary Emergency Contact: [facility] Address: [address]          [place],  [address] [name] Phone: [phone] Mobile Phone: [phone] Relation: Daughter  Code Status:  DNR Goals of care: Advanced Directive information Advanced Directives 01/12/2017  Does Patient Have a Medical Advance Directive? Yes  Type of Advance Directive Out of facility DNR (pink MOST or yellow form)  Does patient want to make changes to medical advance directive? No - Patient declined  Copy of [place] in Chart? -     Chief Complaint  Patient presents with  . Acute Visit    F/U L.E. Swelling    HPI:  Pt is a 76 y.o. female seen today for an acute visit for Follow up on her swelling.  Patient has h/o Diabetes mellitus type 2 , atrial fibrillation on chronic anticoagulation, Venous stasis, Dementia with Behavior problems, S/P fall leading to Ocular floor fracture.   Patient has increased swelling and redness of both Lower extremity right more then left. I had started patient on Doxycycline and increased her Lasix to 60 mg. Patient also had dopplers done here in facility and they  were negative for any DVT. Patient continues to have LE edema . It is some better. patient says her pain is better. She continues to deny SOB or Cough or Dyspnea. Her redness  is better also No fever or chills Past Medical History:  Diagnosis Date  . Allergy   . Atrial fibrillation ([place]) 08/2011   First diagnosed in 08/2011; duration of arrhythmia is uncertain  . Bilateral lower extremity edema   . Chronic diarrhea    diverticulosis  . COPD (chronic obstructive pulmonary disease) ([place])   . Dementia   . Diabetes mellitus, type 2 ([facility])    Diabetic neuropathy  . Dyspnea on exertion    pedal edema  . Gout   . Headache(784.0)    twice weekly  . Hyperlipidemia   . Hypertension   . Osteopenia    DEXA scan 01/2010  . Palpitations   . Seasonal allergies   . Stress incontinence   . Vertigo    Past Surgical History:  Procedure Laterality Date  . APPENDECTOMY    . BREAST BIOPSY  2002   Right  . CESAREAN SECTION     x 2  . CHOLECYSTECTOMY    . COLONOSCOPY  2007   Negative screening study  . HAMMER TOE SURGERY     Bilateral hammer toe amputation  . INCISIONAL HERNIA REPAIR    . KNEE ARTHROSCOPY W/ MENISCAL REPAIR  2007   Bilateral  . UMBILICAL HERNIA REPAIR      Allergies  Allergen Reactions  . Codeine Anaphylaxis and Hives  . Morphine And Related Anaphylaxis and Hives  . Penicillins Anaphylaxis  . Ace Inhibitors Cough    Outpatient Encounter Prescriptions as of 01/12/2017  Medication Sig  . acetaminophen (TYLENOL) 325 MG tablet Take 650 mg by mouth every 6 (six) hours as needed.  Marland Kitchen alendronate (FOSAMAX) 70 MG tablet Give 1 tablet by mouth on Sunday. Take with a full glass of water on an empty stomach.  . carboxymethylcellulose (  REFRESH PLUS) 0.5 % SOLN Place 1 drop into both eyes 4 (four) times daily.  . cholecalciferol (VITAMIN D) 1000 UNITS tablet Take 1,000 Units by mouth daily.  . citalopram (CELEXA) 20 MG tablet Take 1 tablet (20 mg total) by mouth daily.  . digoxin (LANOXIN) 0.125 MG tablet Take 1 tablet by mouth once a day (HOLD FOR AP UNDER 60)  . docusate sodium (COLACE) 100 MG capsule Take 200 mg by mouth daily as needed.   . doxycycline (VIBRAMYCIN) 100 MG capsule Take 100 mg by mouth 2 (two) times daily.  . furosemide (LASIX) 20 MG tablet Take 60 mg by mouth.   . guaifenesin (HUMIBID E) 400 MG TABS tablet Take 400 mg by mouth every 6 (six) hours as needed.  . insulin  NPH-regular Human (NOVOLIN 70/30) (70-30) 100 UNIT/ML injection Give 30 units subcutaneous once a evening  . insulin NPH-regular Human (NOVOLIN 70/30) (70-30) 100 UNIT/ML injection Give 52 units subcutaneous one a morning  . Insulin Pen Needle (EASY TOUCH PEN NEEDLES) 31G X 8 MM MISC Use twice a day  . losartan (COZAAR) 25 MG tablet Take 25 mg by mouth daily.  . memantine (NAMENDA) 10 MG tablet Take 10 mg by mouth 2 (two) times daily.  . metFORMIN (GLUCOPHAGE) 500 MG tablet Take 500 mg by mouth 2 (two) times daily with a meal.   . metoprolol (LOPRESSOR) 100 MG tablet Take 1 tablet (100 mg total) by mouth 2 (two) times daily.  . Multiple Vitamin (MULTIVITAMIN WITH MINERALS) TABS Take 1 tablet by mouth every morning.  Marland Kitchen POTASSIUM CHLORIDE ER PO Potassium chloride extended release capsule, give 20 meq by mouth once day  . pravastatin (PRAVACHOL) 40 MG tablet Take 1 tablet (40 mg total) by mouth every evening.  Alveda Reasons 20 MG TABS tablet TAKE (1) TABLET BY MOUTH ONCE DAILY.   No facility-administered encounter medications on file as of 01/12/2017.      Review of Systems  Unable to perform ROS: Dementia    Immunization History  Administered Date(s) Administered  . Influenza-Unspecified 02/04/2016  . Pneumococcal-Unspecified 02/08/2016   Pertinent  Health Maintenance Due  Topic Date Due  . FOOT EXAM  01/20/2017 (Originally 03/18/1951)  . OPHTHALMOLOGY EXAM  01/20/2017 (Originally 03/18/1951)  . INFLUENZA VACCINE  04/01/2017 (Originally 11/30/2016)  . PNA vac Low Risk Adult (2 of 2 - PCV13) 02/07/2017  . URINE MICROALBUMIN  05/11/2017  . HEMOGLOBIN A1C  05/31/2017  . DEXA SCAN  Completed   Fall Risk  11/06/2015 09/29/2015 08/31/2015 06/30/2015 02/24/2015  Falls in the past year? Yes No No No Yes  Number falls in past yr: 2 or more - - - 2 or more  Injury with Fall? No - - - No   Functional Status Survey:    Vitals:   01/12/17 1412  BP: 126/87  Pulse: 76  Resp: 20  Temp: 98 F (36.7  C)  TempSrc: Oral  SpO2: 100%   There is no height or weight on file to calculate BMI. Physical Exam  Constitutional: She appears well-developed and well-nourished.  HENT:  Head: Normocephalic.  Mouth/Throat: Oropharynx is clear and moist.  Eyes: Pupils are equal, round, and reactive to light.  Neck: Neck supple.  Cardiovascular: Normal rate and normal heart sounds.   No murmur heard. Pulmonary/Chest: Effort normal and breath sounds normal. No respiratory distress. She has no wheezes. She has no rales.  Abdominal: Soft. Bowel sounds are normal. She exhibits no distension. There is no tenderness.  There is no rebound.  Musculoskeletal:  Still has Moderate Edema Bilateral with right more then left. Redness is slightly better.     Labs reviewed:  Recent Labs  11/28/16 0700 12/21/16 0706 01/12/17 0726  NA 141 138 137  K 4.4 4.1 3.8  CL 102 103 102  CO2 31 30 27   GLUCOSE 86 92 154*  BUN 26* 24* 30*  CREATININE 0.94 0.97 0.93  CALCIUM 9.2 9.0 9.2    Recent Labs  01/15/16 0710 05/12/16 0500 11/14/16 0730  AST 20 13* 15  ALT 16 13* 15  ALKPHOS 61 49 48  BILITOT 0.6 0.7 0.7  PROT 7.6 6.6 7.2  ALBUMIN 3.3* 2.9* 3.2*    Recent Labs  01/15/16 0500  11/14/16 0730 11/28/16 0700 01/12/17 0726  WBC 7.1  < > 6.6 8.3 8.4  NEUTROABS 4.0  --  3.6  --   --   HGB 14.0  < > 12.6 13.1 12.9  HCT 44.3  < > 39.4 41.1 40.0  MCV 94.7  < > 92.7 93.0 92.4  PLT 194  < > 231 249 217  < > = values in this interval not displayed. Lab Results  Component Value Date   TSH 3.039 12/21/2016   Lab Results  Component Value Date   HGBA1C 6.5 (H) 11/28/2016   Lab Results  Component Value Date   CHOL 143 12/21/2016   HDL 36 (L) 12/21/2016   LDLCALC 92 12/21/2016   TRIG 77 12/21/2016   CHOLHDL 4.0 12/21/2016    Significant Diagnostic Results in last 30 days:  No results found.  Assessment/Plan  Bilateral LE edema Swelling little better. Not much change. renal function stays  stable D/W the nurse will wrap the Legs for few days and then try Ted hoses. Continue 60 mg lasix. Daily weights Repeat BMP on MON.  Chronic Atrial fibrillation  Doing well on On Lopressor, Digoxin and Xarelto.  Diabetes mellitus Patient last A1C was 6.5 in 07/18 But her BS have been running high in afternoon.  Dementia with behavioral disturbance,  Doing well on Namenda and Celexa Essential hypertension BP well controlled Family/ staff Communication:   Labs/tests ordered:  BMP

## 2017-01-13 ENCOUNTER — Encounter (HOSPITAL_COMMUNITY)
Admission: RE | Admit: 2017-01-13 | Discharge: 2017-01-13 | Disposition: A | Discharge status: Home / Self Care | Payer: Medicare Other | Source: Skilled Nursing Facility | Attending: Internal Medicine | Admitting: Internal Medicine

## 2017-01-13 DIAGNOSIS — F0391 Unspecified dementia with behavioral disturbance: Secondary | ICD-10-CM | POA: Diagnosis not present

## 2017-01-13 DIAGNOSIS — M6281 Muscle weakness (generalized): Secondary | ICD-10-CM | POA: Diagnosis not present

## 2017-01-13 DIAGNOSIS — M810 Age-related osteoporosis without current pathological fracture: Secondary | ICD-10-CM | POA: Diagnosis not present

## 2017-01-13 DIAGNOSIS — R41841 Cognitive communication deficit: Secondary | ICD-10-CM | POA: Diagnosis not present

## 2017-01-13 DIAGNOSIS — F3289 Other specified depressive episodes: Secondary | ICD-10-CM | POA: Diagnosis not present

## 2017-01-13 DIAGNOSIS — R279 Unspecified lack of coordination: Secondary | ICD-10-CM | POA: Diagnosis not present

## 2017-01-13 DIAGNOSIS — I5042 Chronic combined systolic (congestive) and diastolic (congestive) heart failure: Secondary | ICD-10-CM | POA: Diagnosis not present

## 2017-01-13 DIAGNOSIS — L0390 Cellulitis, unspecified: Secondary | ICD-10-CM | POA: Diagnosis not present

## 2017-01-13 LAB — BASIC METABOLIC PANEL
Anion gap: 8 (ref 5–15)
BUN: 32 mg/dL — AB (ref 6–20)
CALCIUM: 8.8 mg/dL — AB (ref 8.9–10.3)
CO2: 27 mmol/L (ref 22–32)
CREATININE: 0.92 mg/dL (ref 0.44–1.00)
Chloride: 103 mmol/L (ref 101–111)
GFR calc Af Amer: >60 mL/min (ref >60)
GFR, EST NON AFRICAN AMERICAN: 59 mL/min — AB (ref >60)
GLUCOSE: 134 mg/dL — AB (ref 65–99)
POTASSIUM: 3.8 mmol/L (ref 3.5–5.1)
SODIUM: 138 mmol/L (ref 135–145)

## 2017-01-13 LAB — CBC
HCT: 38.6 % (ref 36.0–46.0)
Hemoglobin: 12.6 g/dL (ref 12.0–15.0)
MCH: 30.1 pg (ref 26.0–34.0)
MCHC: 32.6 g/dL (ref 30.0–36.0)
MCV: 92.3 fL (ref 78.0–100.0)
PLATELETS: 201 10*3/uL (ref 150–400)
RBC: 4.18 MIL/uL (ref 3.87–5.11)
RDW: 14.1 % (ref 11.5–15.5)
WBC: 8 10*3/uL (ref 4.0–10.5)

## 2017-01-13 LAB — TSH: TSH: 2.592 u[IU]/mL (ref 0.350–4.500)

## 2017-01-14 LAB — VITAMIN D 25 HYDROXY (VIT D DEFICIENCY, FRACTURES): VIT D 25 HYDROXY: 32.9 ng/mL (ref 30.0–100.0)

## 2017-01-16 ENCOUNTER — Encounter (HOSPITAL_COMMUNITY)
Admission: RE | Admit: 2017-01-16 | Discharge: 2017-01-16 | Disposition: A | Discharge status: Home / Self Care | Payer: Medicare Other | Source: Skilled Nursing Facility | Attending: Internal Medicine | Admitting: Internal Medicine

## 2017-01-16 DIAGNOSIS — M810 Age-related osteoporosis without current pathological fracture: Secondary | ICD-10-CM | POA: Diagnosis not present

## 2017-01-16 DIAGNOSIS — F0391 Unspecified dementia with behavioral disturbance: Secondary | ICD-10-CM | POA: Diagnosis not present

## 2017-01-16 DIAGNOSIS — R279 Unspecified lack of coordination: Secondary | ICD-10-CM | POA: Diagnosis not present

## 2017-01-16 DIAGNOSIS — R41841 Cognitive communication deficit: Secondary | ICD-10-CM | POA: Diagnosis not present

## 2017-01-16 DIAGNOSIS — L0390 Cellulitis, unspecified: Secondary | ICD-10-CM | POA: Diagnosis not present

## 2017-01-16 DIAGNOSIS — I5042 Chronic combined systolic (congestive) and diastolic (congestive) heart failure: Secondary | ICD-10-CM | POA: Diagnosis not present

## 2017-01-16 DIAGNOSIS — F3289 Other specified depressive episodes: Secondary | ICD-10-CM | POA: Diagnosis not present

## 2017-01-16 DIAGNOSIS — M6281 Muscle weakness (generalized): Secondary | ICD-10-CM | POA: Diagnosis not present

## 2017-01-16 LAB — CBC
HCT: 41.5 % (ref 36.0–46.0)
Hemoglobin: 12.9 g/dL (ref 12.0–15.0)
MCH: 29.1 pg (ref 26.0–34.0)
MCHC: 31.1 g/dL (ref 30.0–36.0)
MCV: 93.5 fL (ref 78.0–100.0)
Platelets: 239 10*3/uL (ref 150–400)
RBC: 4.44 MIL/uL (ref 3.87–5.11)
RDW: 14.1 % (ref 11.5–15.5)
WBC: 8.5 10*3/uL (ref 4.0–10.5)

## 2017-01-16 LAB — BASIC METABOLIC PANEL
ANION GAP: 9 (ref 5–15)
BUN: 32 mg/dL — ABNORMAL HIGH (ref 6–20)
CO2: 31 mmol/L (ref 22–32)
Calcium: 9.7 mg/dL (ref 8.9–10.3)
Chloride: 98 mmol/L — ABNORMAL LOW (ref 101–111)
Creatinine, Ser: 1.08 mg/dL — ABNORMAL HIGH (ref 0.44–1.00)
GFR calc Af Amer: 56 mL/min — ABNORMAL LOW (ref >60)
GFR, EST NON AFRICAN AMERICAN: 49 mL/min — AB (ref >60)
Glucose, Bld: 142 mg/dL — ABNORMAL HIGH (ref 65–99)
POTASSIUM: 4.1 mmol/L (ref 3.5–5.1)
SODIUM: 138 mmol/L (ref 135–145)

## 2017-01-16 LAB — TSH: TSH: 3.931 u[IU]/mL (ref 0.350–4.500)

## 2017-01-16 NOTE — Progress Notes (Signed)
This encounter was created in error - please disregard.

## 2017-01-17 DIAGNOSIS — M6281 Muscle weakness (generalized): Secondary | ICD-10-CM | POA: Diagnosis not present

## 2017-01-17 DIAGNOSIS — R279 Unspecified lack of coordination: Secondary | ICD-10-CM | POA: Diagnosis not present

## 2017-01-17 DIAGNOSIS — F3289 Other specified depressive episodes: Secondary | ICD-10-CM | POA: Diagnosis not present

## 2017-01-17 DIAGNOSIS — M810 Age-related osteoporosis without current pathological fracture: Secondary | ICD-10-CM | POA: Diagnosis not present

## 2017-01-17 DIAGNOSIS — R41841 Cognitive communication deficit: Secondary | ICD-10-CM | POA: Diagnosis not present

## 2017-01-17 DIAGNOSIS — F0391 Unspecified dementia with behavioral disturbance: Secondary | ICD-10-CM | POA: Diagnosis not present

## 2017-01-17 LAB — VITAMIN D 25 HYDROXY (VIT D DEFICIENCY, FRACTURES): Vit D, 25-Hydroxy: 33.2 ng/mL (ref 30.0–100.0)

## 2017-01-18 DIAGNOSIS — M6281 Muscle weakness (generalized): Secondary | ICD-10-CM | POA: Diagnosis not present

## 2017-01-18 DIAGNOSIS — F0391 Unspecified dementia with behavioral disturbance: Secondary | ICD-10-CM | POA: Diagnosis not present

## 2017-01-18 DIAGNOSIS — M810 Age-related osteoporosis without current pathological fracture: Secondary | ICD-10-CM | POA: Diagnosis not present

## 2017-01-18 DIAGNOSIS — R279 Unspecified lack of coordination: Secondary | ICD-10-CM | POA: Diagnosis not present

## 2017-01-18 DIAGNOSIS — R41841 Cognitive communication deficit: Secondary | ICD-10-CM | POA: Diagnosis not present

## 2017-01-18 DIAGNOSIS — F3289 Other specified depressive episodes: Secondary | ICD-10-CM | POA: Diagnosis not present

## 2017-01-19 ENCOUNTER — Non-Acute Institutional Stay (SKILLED_NURSING_FACILITY): Payer: Medicare Other

## 2017-01-19 DIAGNOSIS — M6281 Muscle weakness (generalized): Secondary | ICD-10-CM | POA: Diagnosis not present

## 2017-01-19 DIAGNOSIS — R279 Unspecified lack of coordination: Secondary | ICD-10-CM | POA: Diagnosis not present

## 2017-01-19 DIAGNOSIS — M810 Age-related osteoporosis without current pathological fracture: Secondary | ICD-10-CM | POA: Diagnosis not present

## 2017-01-19 DIAGNOSIS — Z0000 Encounter for general adult medical examination without abnormal findings: Secondary | ICD-10-CM

## 2017-01-19 DIAGNOSIS — F3289 Other specified depressive episodes: Secondary | ICD-10-CM | POA: Diagnosis not present

## 2017-01-19 DIAGNOSIS — F0391 Unspecified dementia with behavioral disturbance: Secondary | ICD-10-CM | POA: Diagnosis not present

## 2017-01-19 DIAGNOSIS — R41841 Cognitive communication deficit: Secondary | ICD-10-CM | POA: Diagnosis not present

## 2017-01-19 NOTE — Patient Instructions (Addendum)
Christine [name] , Thank you for taking time to come for your Medicare Wellness Visit. I appreciate your ongoing commitment to your health goals. Please review the following plan we discussed and let me know if I can assist you in the future.   Screening recommendations/referrals: Colonoscopy excluded, pt is over age 77 Mammogram excluded, pt is over age 30 Bone Density up to date Recommended yearly ophthalmology/optometry visit for glaucoma screening and checkup Recommended yearly dental visit for hygiene and checkup  Vaccinations: Influenza vaccine due Pneumococcal vaccine up to date. Prevnar is due 02/07/17 Tdap vaccine due Shingles vaccine not in records    Advanced directives: Need a copy of healthcare power of attorney and living will for chart  Conditions/risks identified: None  Next appointment: Dr. Lyndel Safe makes rounds   Preventive Care 65 Years and Older, Female Preventive care refers to lifestyle choices and visits with your health care provider that can promote health and wellness. What does preventive care include?  A yearly physical exam. This is also called an annual well check.  Dental exams once or twice a year.  Routine eye exams. Ask your health care provider how often you should have your eyes checked.  Personal lifestyle choices, including:  Daily care of your teeth and gums.  Regular physical activity.  Eating a healthy diet.  Avoiding tobacco and drug use.  Limiting alcohol use.  Practicing safe sex.  Taking low-dose aspirin every day.  Taking vitamin and mineral supplements as recommended by your health care provider. What happens during an annual well check? The services and screenings done by your health care provider during your annual well check will depend on your age, overall health, lifestyle risk factors, and family history of disease. Counseling  Your health care provider may ask you questions about your:  Alcohol use.  Tobacco  use.  Drug use.  Emotional well-being.  Home and relationship well-being.  Sexual activity.  Eating habits.  History of falls.  Memory and ability to understand (cognition).  Work and work Statistician.  Reproductive health. Screening  You may have the following tests or measurements:  Height, weight, and BMI.  Blood pressure.  Lipid and cholesterol levels. These may be checked every 5 years, or more frequently if you are over 26 years old.  Skin check.  Lung cancer screening. You may have this screening every year starting at age 54 if you have a 30-pack-year history of smoking and currently smoke or have quit within the past 15 years.  Fecal occult blood test (FOBT) of the stool. You may have this test every year starting at age 57.  Flexible sigmoidoscopy or colonoscopy. You may have a sigmoidoscopy every 5 years or a colonoscopy every 10 years starting at age 87.  Hepatitis C blood test.  Hepatitis B blood test.  Sexually transmitted disease (STD) testing.  Diabetes screening. This is done by checking your blood sugar (glucose) after you have not eaten for a while (fasting). You may have this done every 1-3 years.  Bone density scan. This is done to screen for osteoporosis. You may have this done starting at age 37.  Mammogram. This may be done every 1-2 years. Talk to your health care provider about how often you should have regular mammograms. Talk with your health care provider about your test results, treatment options, and if necessary, the need for more tests. Vaccines  Your health care provider may recommend certain vaccines, such as:  Influenza vaccine. This is recommended every year.  Tetanus, diphtheria, and acellular pertussis (Tdap, Td) vaccine. You may need a Td booster every 10 years.  Zoster vaccine. You may need this after age 52.  Pneumococcal 13-valent conjugate (PCV13) vaccine. One dose is recommended after age 26.  Pneumococcal  polysaccharide (PPSV23) vaccine. One dose is recommended after age 84. Talk to your health care provider about which screenings and vaccines you need and how often you need them. This information is not intended to replace advice given to you by your health care provider. Make sure you discuss any questions you have with your health care provider. Document Released: 05/15/2015 Document Revised: 01/06/2016 Document Reviewed: 02/17/2015 Elsevier Interactive Patient Education  2017 [place] Prevention in the Home Falls can cause injuries. They can happen to people of all ages. There are many things you can do to make your home safe and to help prevent falls. What can I do on the outside of my home?  Regularly fix the edges of walkways and driveways and fix any cracks.  Remove anything that might make you trip as you walk through a door, such as a raised step or threshold.  Trim any bushes or trees on the path to your home.  Use bright outdoor lighting.  Clear any walking paths of anything that might make someone trip, such as rocks or tools.  Regularly check to see if handrails are loose or broken. Make sure that both sides of any steps have handrails.  Any raised decks and porches should have guardrails on the edges.  Have any leaves, snow, or ice cleared regularly.  Use sand or salt on walking paths during winter.  Clean up any spills in your garage right away. This includes oil or grease spills. What can I do in the bathroom?  Use night lights.  Install grab bars by the toilet and in the tub and shower. Do not use towel bars as grab bars.  Use non-skid mats or decals in the tub or shower.  If you need to sit down in the shower, use a plastic, non-slip stool.  Keep the floor dry. Clean up any water that spills on the floor as soon as it happens.  Remove soap buildup in the tub or shower regularly.  Attach bath mats securely with double-sided non-slip rug  tape.  Do not have throw rugs and other things on the floor that can make you trip. What can I do in the bedroom?  Use night lights.  Make sure that you have a light by your bed that is easy to reach.  Do not use any sheets or blankets that are too big for your bed. They should not hang down onto the floor.  Have a firm chair that has side arms. You can use this for support while you get dressed.  Do not have throw rugs and other things on the floor that can make you trip. What can I do in the kitchen?  Clean up any spills right away.  Avoid walking on wet floors.  Keep items that you use a lot in easy-to-reach places.  If you need to reach something above you, use a strong step stool that has a grab bar.  Keep electrical cords out of the way.  Do not use floor polish or wax that makes floors slippery. If you must use wax, use non-skid floor wax.  Do not have throw rugs and other things on the floor that can make you trip. What can I do  with my stairs?  Do not leave any items on the stairs.  Make sure that there are handrails on both sides of the stairs and use them. Fix handrails that are broken or loose. Make sure that handrails are as long as the stairways.  Check any carpeting to make sure that it is firmly attached to the stairs. Fix any carpet that is loose or worn.  Avoid having throw rugs at the top or bottom of the stairs. If you do have throw rugs, attach them to the floor with carpet tape.  Make sure that you have a light switch at the top of the stairs and the bottom of the stairs. If you do not have them, ask someone to add them for you. What else can I do to help prevent falls?  Wear shoes that:  Do not have high heels.  Have rubber bottoms.  Are comfortable and fit you well.  Are closed at the toe. Do not wear sandals.  If you use a stepladder:  Make sure that it is fully opened. Do not climb a closed stepladder.  Make sure that both sides of the  stepladder are locked into place.  Ask someone to hold it for you, if possible.  Clearly mark and make sure that you can see:  Any grab bars or handrails.  First and last steps.  Where the edge of each step is.  Use tools that help you move around (mobility aids) if they are needed. These include:  Canes.  Walkers.  Scooters.  Crutches.  Turn on the lights when you go into a dark area. Replace any light bulbs as soon as they burn out.  Set up your furniture so you have a clear path. Avoid moving your furniture around.  If any of your floors are uneven, fix them.  If there are any pets around you, be aware of where they are.  Review your medicines with your doctor. Some medicines can make you feel dizzy. This can increase your chance of falling. Ask your doctor what other things that you can do to help prevent falls. This information is not intended to replace advice given to you by your health care provider. Make sure you discuss any questions you have with your health care provider. Document Released: 02/12/2009 Document Revised: 09/24/2015 Document Reviewed: 05/23/2014 Elsevier Interactive Patient Education  2017 [employer].

## 2017-01-19 NOTE — Progress Notes (Signed)
Subjective:   Christine [name] is a 76 y.o. female who presents for an Initial Medicare Annual Wellness Visit at [facility]       Objective:    Today's Vitals   01/19/17 1053  BP: 122/64  Pulse: 92  Temp: 97.7 F (36.5 C)  TempSrc: Oral  SpO2: 90%  Weight: 206 lb (93.4 kg)  Height: 5\' 6"  (1.676 m)   Body mass index is 33.25 kg/m.   Current Medications (verified) Outpatient Encounter Prescriptions as of 01/19/2017  Medication Sig  . acetaminophen (TYLENOL) 325 MG tablet Take 650 mg by mouth every 6 (six) hours as needed.  Marland Kitchen alendronate (FOSAMAX) 70 MG tablet Give 1 tablet by mouth on Sunday. Take with a full glass of water on an empty stomach.  . carboxymethylcellulose (REFRESH PLUS) 0.5 % SOLN Place 1 drop into both eyes 4 (four) times daily.  . cholecalciferol (VITAMIN D) 1000 UNITS tablet Take 1,000 Units by mouth daily.  . citalopram (CELEXA) 20 MG tablet Take 1 tablet (20 mg total) by mouth daily.  . digoxin (LANOXIN) 0.125 MG tablet Take 1 tablet by mouth once a day (HOLD FOR AP UNDER 60)  . docusate sodium (COLACE) 100 MG capsule Take 200 mg by mouth daily as needed.   . doxycycline (VIBRAMYCIN) 100 MG capsule Take 100 mg by mouth 2 (two) times daily.  . furosemide (LASIX) 20 MG tablet Take 60 mg by mouth.   . guaifenesin (HUMIBID E) 400 MG TABS tablet Take 400 mg by mouth every 6 (six) hours as needed.  . insulin NPH-regular Human (NOVOLIN 70/30) (70-30) 100 UNIT/ML injection Give 30 units subcutaneous once a evening  . insulin NPH-regular Human (NOVOLIN 70/30) (70-30) 100 UNIT/ML injection Give 52 units subcutaneous one a morning  . Insulin Pen Needle (EASY TOUCH PEN NEEDLES) 31G X 8 MM MISC Use twice a day  . losartan (COZAAR) 25 MG tablet Take 25 mg by mouth daily.  . memantine (NAMENDA) 10 MG tablet Take 10 mg by mouth 2 (two) times daily.  . metFORMIN (GLUCOPHAGE) 500 MG tablet Take 500 mg by mouth 2 (two) times daily with a meal.   .  metoprolol (LOPRESSOR) 100 MG tablet Take 1 tablet (100 mg total) by mouth 2 (two) times daily.  . Multiple Vitamin (MULTIVITAMIN WITH MINERALS) TABS Take 1 tablet by mouth every morning.  Marland Kitchen POTASSIUM CHLORIDE ER PO Potassium chloride extended release capsule, give 20 meq by mouth once day  . pravastatin (PRAVACHOL) 40 MG tablet Take 1 tablet (40 mg total) by mouth every evening.  Alveda Reasons 20 MG TABS tablet TAKE (1) TABLET BY MOUTH ONCE DAILY.   No facility-administered encounter medications on file as of 01/19/2017.     Allergies (verified) Codeine; Morphine and related; Penicillins; and Ace inhibitors   History: Past Medical History:  Diagnosis Date  . Allergy   . Atrial fibrillation ([place]) 08/2011   First diagnosed in 08/2011; duration of arrhythmia is uncertain  . Bilateral lower extremity edema   . Chronic diarrhea    diverticulosis  . COPD (chronic obstructive pulmonary disease) ([place])   . Dementia   . Diabetes mellitus, type 2 (HCC)    Diabetic neuropathy  . Dyspnea on exertion    pedal edema  . Gout   . Headache(784.0)    twice weekly  . Hyperlipidemia   . Hypertension   . Osteopenia    DEXA scan 01/2010  . Palpitations   .  Seasonal allergies   . Stress incontinence   . Vertigo    Past Surgical History:  Procedure Laterality Date  . APPENDECTOMY    . BREAST BIOPSY  2002   Right  . CESAREAN SECTION     x 2  . CHOLECYSTECTOMY    . COLONOSCOPY  2007   Negative screening study  . HAMMER TOE SURGERY     Bilateral hammer toe amputation  . INCISIONAL HERNIA REPAIR    . KNEE ARTHROSCOPY W/ MENISCAL REPAIR  2007   Bilateral  . UMBILICAL HERNIA REPAIR     Family History  Problem Relation Age of Onset  . Adopted: Yes   Social History   Occupational History  . Engineer, materials    Social History Main Topics  . Smoking status: Never Smoker  . Smokeless tobacco: Never Used  . Alcohol use No  . Drug use: No  . Sexual activity: Not on file    Tobacco  Counseling Counseling given: Not Answered   Activities of Daily Living In your present state of health, do you have any difficulty performing the following activities: 01/19/2017  Hearing? N  Vision? N  Difficulty concentrating or making decisions? Y  Walking or climbing stairs? Y  Dressing or bathing? Y  Doing errands, shopping? Y  Preparing Food and eating ? Y  Using the Toilet? Y  In the past six months, have you accidently leaked urine? N  Do you have problems with loss of bowel control? N  Managing your Medications? Y  Managing your Finances? Y  Housekeeping or managing your Housekeeping? Y  Some recent data might be hidden    Immunizations and Health Maintenance Immunization History  Administered Date(s) Administered  . Influenza-Unspecified 02/04/2016  . Pneumococcal-Unspecified 02/08/2016   There are no preventive care reminders to display for this patient.  Patient Care Team: Celedonio Savage, MD as PCP - General (Family Medicine) Rothbart, Cristopher Estimable, MD (Cardiology) Lendon Colonel, NP as Nurse Practitioner (Nurse Practitioner)  Indicate any recent Medical Services you may have received from other than [facility] providers in the past year (date may be approximate).     Assessment:   This is a routine wellness examination for Christine [name].   Hearing/Vision screen No exam data present  Dietary issues and exercise activities discussed: Current Exercise Habits: The patient does not participate in regular exercise at present, Exercise limited by: None identified  Goals    None     Depression Screen PHQ 2/9 Scores 01/19/2017 11/06/2015 09/29/2015 08/31/2015 06/30/2015 02/24/2015 02/10/2015  PHQ - 2 Score 0 1 0 0 0 1 0    Fall Risk Fall Risk  01/19/2017 11/06/2015 09/29/2015 08/31/2015 06/30/2015  Falls in the past year? No Yes No No No  Number falls in past yr: - 2 or more - - -  Injury with Fall? - No - - -    Cognitive Function: MMSE - Mini Mental State Exam 01/19/2017  Not  completed: Refused        Screening Tests Health Maintenance  Topic Date Due  . FOOT EXAM  01/20/2017 (Originally 03/18/1951)  . OPHTHALMOLOGY EXAM  01/20/2017 (Originally 03/18/1951)  . INFLUENZA VACCINE  04/01/2017 (Originally 11/30/2016)  . TETANUS/TDAP  11/29/2025 (Originally 03/19/1960)  . PNA vac Low Risk Adult (2 of 2 - PCV13) 02/07/2017  . URINE MICROALBUMIN  05/11/2017  . HEMOGLOBIN A1C  05/31/2017  . DEXA SCAN  Completed      Plan:    I have personally reviewed  and addressed the Medicare Annual Wellness questionnaire and have noted the following in the patient's chart:  A. Medical and social history B. Use of alcohol, tobacco or illicit drugs  C. Current medications and supplements D. Functional ability and status E.  Nutritional status F.  Physical activity G. Advance directives H. List of other physicians I.  Hospitalizations, surgeries, and ER visits in previous 12 months J.  [place] to include hearing, vision, cognitive, depression L. Referrals and appointments - none  In addition, I have reviewed and discussed with patient certain preventive protocols, quality metrics, and best practice recommendations. A written personalized care plan for preventive services as well as general preventive health recommendations were provided to patient.  See attached scanned questionnaire for additional information.   Signed,   Rich Reining, RN Nurse Health Advisor   Quick Notes   Health Maintenance: Flu vaccine, tdap,due     Abnormal Screen: Refused mental exam     Patient Concerns: None     Nurse Concerns: None

## 2017-01-20 ENCOUNTER — Non-Acute Institutional Stay (SKILLED_NURSING_FACILITY): Payer: Medicare Other | Admitting: Internal Medicine

## 2017-01-20 ENCOUNTER — Encounter: Payer: Self-pay | Admitting: Internal Medicine

## 2017-01-20 DIAGNOSIS — I4891 Unspecified atrial fibrillation: Secondary | ICD-10-CM | POA: Diagnosis not present

## 2017-01-20 DIAGNOSIS — E1140 Type 2 diabetes mellitus with diabetic neuropathy, unspecified: Secondary | ICD-10-CM

## 2017-01-20 DIAGNOSIS — R600 Localized edema: Secondary | ICD-10-CM | POA: Diagnosis not present

## 2017-01-20 DIAGNOSIS — E785 Hyperlipidemia, unspecified: Secondary | ICD-10-CM

## 2017-01-20 DIAGNOSIS — M810 Age-related osteoporosis without current pathological fracture: Secondary | ICD-10-CM

## 2017-01-20 DIAGNOSIS — Z794 Long term (current) use of insulin: Secondary | ICD-10-CM | POA: Diagnosis not present

## 2017-01-20 DIAGNOSIS — F0391 Unspecified dementia with behavioral disturbance: Secondary | ICD-10-CM | POA: Diagnosis not present

## 2017-01-20 NOTE — Progress Notes (Signed)
Location:   [facility] Room Number: 113/W Place of Service:  SNF (31) Provider:  Dionicia Abler, MD  Patient Care Team: Celedonio Savage, MD as PCP - General (Family Medicine) Rothbart, Cristopher Estimable, MD (Cardiology) Lendon Colonel, NP as Nurse Practitioner (Nurse Practitioner)  Extended Emergency Contact Information Primary Emergency Contact: [facility] Address: [address]          [place], [place] [address] [name] of [place] Phone: [phone] Mobile Phone: [phone] Relation: Daughter  Code Status:  DNR Goals of care: Advanced Directive information Advanced Directives 01/20/2017  Does Patient Have a Medical Advance Directive? Yes  Type of Advance Directive Out of facility DNR (pink MOST or yellow form)  Does patient want to make changes to medical advance directive? No - Patient declined  Copy of [place] in Chart? -  Pre-existing out of facility DNR order (yellow form or pink MOST form) -     Chief complaint-routine visit for medical management of chronic medical conditions including diabetes type 2-atrial fibrillation-history of lower extremity edema-dementia-osteoporosis-hyperlipidemia-diabetic retinopathy rash vitamin D deficiency-hypertension.    HPI:  Pt is a 76 y.o. female seen today for medical management of chronic diseases as noted above.--Most recent acute issue has been some increased swelling and redness of her lower legs right more than left-she had been started on doxycycline her Lasix was increased up to 60 mg a day.  Dopplers were done which were negative for any DVT.  Redness did improve.--It was decided to wrap her legs as much as possible and she currently has wrapping on apparently this is helping some she continues on Lasix 60 mg a day   Regards to other medical conditions she does have a history of diabetes type 2 she is on Novolin 70-30 52 units in the morning and 30 units later  in the day hemoglobin A1c was 6.5 on 11/28/2016 CBGs appear to be in the lower mid 100s fairly consistently in the morning.  However at noon there is a bit more variability with recent readings in the higher 100s to lower 200s-this appears to be the case as well at 4 PM and at at bedtime blood sugars often are in the 200 range.  She is on Glucophage 500 mg twice a day in addition to the insulin.  Her atrial fibrillation appears well controlled she is on Lopressor as well as digoxin she is on Xarelto for anticoagulation.  She has a listed history of dementia with behaviors but this is been quite stable she is on Namenda and on Celexa for coexistent depression she continues to be in good spirits pleasant somewhat confused but cooperative I do not note any recent behaviors.  On one point she did sustain an ocular for fracture after a fall this appears stabilized she is followed by after myology with a history of diabetic retinopathy with emphasis on strict glucose control-.  In regards to her edema again this appears stable although somewhat significant on Lasix 60 mg a day ejection fraction of 55-65% on echo done in August 2015 they could not assist diastolic dysfunction because of her A. fib.  Currently she is sitting in her wheelchair comfortably is pleasant alert smiling cooperative which is her baseline.     .     Past Medical History:  Diagnosis Date  . Allergy   . Atrial fibrillation ([place]) 08/2011   First diagnosed in 08/2011; duration of arrhythmia is uncertain  . Bilateral lower  extremity edema   . Chronic diarrhea    diverticulosis  . COPD (chronic obstructive pulmonary disease) ([place])   . Dementia   . Diabetes mellitus, type 2 (HCC)    Diabetic neuropathy  . Dyspnea on exertion    pedal edema  . Gout   . Headache(784.0)    twice weekly  . Hyperlipidemia   . Hypertension   . Osteopenia    DEXA scan 01/2010  . Palpitations   . Seasonal allergies   . Stress incontinence    . Vertigo    Past Surgical History:  Procedure Laterality Date  . APPENDECTOMY    . BREAST BIOPSY  2002   Right  . CESAREAN SECTION     x 2  . CHOLECYSTECTOMY    . COLONOSCOPY  2007   Negative screening study  . HAMMER TOE SURGERY     Bilateral hammer toe amputation  . INCISIONAL HERNIA REPAIR    . KNEE ARTHROSCOPY W/ MENISCAL REPAIR  2007   Bilateral  . UMBILICAL HERNIA REPAIR      Allergies  Allergen Reactions  . Codeine Anaphylaxis and Hives  . Morphine And Related Anaphylaxis and Hives  . Penicillins Anaphylaxis  . Ace Inhibitors Cough    Outpatient Encounter Prescriptions as of 01/20/2017  Medication Sig  . acetaminophen (TYLENOL) 325 MG tablet Take 650 mg by mouth every 6 (six) hours as needed.  Marland Kitchen alendronate (FOSAMAX) 70 MG tablet Give 1 tablet by mouth on Sunday. Take with a full glass of water on an empty stomach.  . carboxymethylcellulose (REFRESH PLUS) 0.5 % SOLN Place 1 drop into both eyes 4 (four) times daily.  . cholecalciferol (VITAMIN D) 1000 UNITS tablet Take 1,000 Units by mouth daily.  . citalopram (CELEXA) 20 MG tablet Take 1 tablet (20 mg total) by mouth daily.  . digoxin (LANOXIN) 0.125 MG tablet Take 1 tablet by mouth once a day (HOLD FOR AP UNDER 60)  . docusate sodium (COLACE) 100 MG capsule Take 200 mg by mouth daily as needed.   . furosemide (LASIX) 20 MG tablet Take 60 mg by mouth.   . guaifenesin (HUMIBID E) 400 MG TABS tablet Take 400 mg by mouth every 6 (six) hours as needed.  . insulin NPH-regular Human (NOVOLIN 70/30) (70-30) 100 UNIT/ML injection Give 30 units subcutaneous once a evening  . insulin NPH-regular Human (NOVOLIN 70/30) (70-30) 100 UNIT/ML injection Give 52 units subcutaneous one a morning  . Insulin Pen Needle (EASY TOUCH PEN NEEDLES) 31G X 8 MM MISC Use twice a day  . losartan (COZAAR) 25 MG tablet Take 25 mg by mouth daily.  . memantine (NAMENDA) 10 MG tablet Take 10 mg by mouth 2 (two) times daily.  . metFORMIN  (GLUCOPHAGE) 500 MG tablet Take 500 mg by mouth 2 (two) times daily with a meal.   . metoprolol (LOPRESSOR) 100 MG tablet Take 1 tablet (100 mg total) by mouth 2 (two) times daily.  . Multiple Vitamin (MULTIVITAMIN WITH MINERALS) TABS Take 1 tablet by mouth every morning.  Marland Kitchen POTASSIUM CHLORIDE ER PO Potassium chloride extended release capsule, give 20 meq by mouth once day  . pravastatin (PRAVACHOL) 40 MG tablet Take 1 tablet (40 mg total) by mouth every evening.  Alveda Reasons 20 MG TABS tablet TAKE (1) TABLET BY MOUTH ONCE DAILY.  . [DISCONTINUED] doxycycline (VIBRAMYCIN) 100 MG capsule Take 100 mg by mouth 2 (two) times daily.   No facility-administered encounter medications on file as of 01/20/2017.  Review of Systems this This is limited secondary to dementia but she is not complaining of any shortness of breath or chest pain denies any leg pain-at times will attempt to get up without assistance and I did again caution her to call for help before she gets out of her wheelchair.    Immunization History  Administered Date(s) Administered  . Influenza-Unspecified 02/04/2016  . Pneumococcal-Unspecified 02/08/2016   Pertinent  Health Maintenance Due  Topic Date Due  . FOOT EXAM  01/20/2017 (Originally 03/18/1951)  . OPHTHALMOLOGY EXAM  01/20/2017 (Originally 03/18/1951)  . INFLUENZA VACCINE  04/01/2017 (Originally 11/30/2016)  . PNA vac Low Risk Adult (2 of 2 - PCV13) 02/07/2017  . URINE MICROALBUMIN  05/11/2017  . HEMOGLOBIN A1C  05/31/2017  . DEXA SCAN  Completed   Fall Risk  01/19/2017 11/06/2015 09/29/2015 08/31/2015 06/30/2015  Falls in the past year? No Yes No No No  Number falls in past yr: - 2 or more - - -  Injury with Fall? - No - - -   Functional Status Survey:    Vitals:   01/20/17 1136  BP: 121/65  Pulse: 78  Resp: 20  Temp: 98.9 F (37.2 C)  TempSrc: Oral  Weight: 209 lb (94.8 kg)  Height: 5\' 6"  (1.676 m)   Body mass index is 33.73 kg/m. Physical Exam   In  general this is a very pleasant elderly female in no distress sitting comfortably in her wheelchair.  Her skin is warm and dry-currently her legs are wrapped bilaterally but per review of recent progress notes the redness apparently has improved.  Eyes pupils appear reactive to light sclera and conjunctiva are clear visual acuity appears grossly intact she has prescription lenses-she does have some visual deficits apparently with her history of diabetic retinopathy.  But she says this is not visibly changed recently.  Oropharynx clear mucous membranes moist.  Chest is clear to auscultation there is no labored breathing.  Heart is irregular irregular rate and rhythm without murmur gallop or rub again her legs are currently wrapped she appears to have significant edema but apparently not increased from baseline.  Abdomen soft obese soft nontender with active bowel sounds.  Muscle skeletal does move all extremities 4 again with some lower extremity weakness with her edema and wrapping of legs upper extremity strength appears preserved.  Neurologic is grossly intact her speech is clear no lateralizing findings.  Psych she is oriented to self is pleasant cooperative.    Labs reviewed:  Recent Labs  01/12/17 0726 01/13/17 0707 01/16/17 0645  NA 137 138 138  K 3.8 3.8 4.1  CL 102 103 98*  CO2 27 27 31   GLUCOSE 154* 134* 142*  BUN 30* 32* 32*  CREATININE 0.93 0.92 1.08*  CALCIUM 9.2 8.8* 9.7    Recent Labs  05/12/16 0500 11/14/16 0730  AST 13* 15  ALT 13* 15  ALKPHOS 49 48  BILITOT 0.7 0.7  PROT 6.6 7.2  ALBUMIN 2.9* 3.2*    Recent Labs  11/14/16 0730  01/12/17 0726 01/13/17 0707 01/16/17 0645  WBC 6.6  < > 8.4 8.0 8.5  NEUTROABS 3.6  --   --   --   --   HGB 12.6  < > 12.9 12.6 12.9  HCT 39.4  < > 40.0 38.6 41.5  MCV 92.7  < > 92.4 92.3 93.5  PLT 231  < > 217 201 239  < > = values in this interval not displayed. Lab Results  Component Value Date   TSH  3.931 01/16/2017   Lab Results  Component Value Date   HGBA1C 6.5 (H) 11/28/2016   Lab Results  Component Value Date   CHOL 143 12/21/2016   HDL 36 (L) 12/21/2016   LDLCALC 92 12/21/2016   TRIG 77 12/21/2016   CHOLHDL 4.0 12/21/2016    Significant Diagnostic Results in last 30 days:  No results found.  Assessment/Plan  1-atrial fibrillation-this appears rate controlled on Lopressor and digoxin-she is on Xarelto for anticoagulation-has had a low digoxin level in the past but this appears to be stable  .  #2-history type 2 diabetes hemoglobin A1c was 6.5 on lab done in late July 2018 this is stable with previous readings-blood sugars are mildly elevated later day parts-- appear to be stable in the morning will cautiously increase the Novolin 70-30 up to 55 units from 52 units every morning and monitor-appear she has frequent 200+ readings later in the day but I seldom see anything in the high 200s to 300 range  Again per ophthalmology emphasis on's. Glucose control with her history of diabetic retinopathy.  As well as apparently retinal vein occlusion.  #3-history of lower extremity edema venous stasis changes again she does have wrapping of her legs her weight appears to be relatively stable now at 209 pounds at this point will monitor-again echo in 2015 showed ejection fraction 55-60% and could not assess diastolic function because of A. fib.  #4 history of dementia with behaviors again her behaviors appear to have largely subsided she is on Namenda as well as Celexa for coexistent depression and this appears to be quite stable whenever I are she is pleasant smiling and cooperative.  #5-history of ocular floor fracture after fall she appears to have made it fairly unremarkable recovery she is followed by ophthalmology.  #6 history of hyperlipidemia she is on Pravachol LDL was 92 on lab done on August 2018.  #7-history hypertension-this has been stable on Lopressor as well as  Cozaar   #8-history of vitamin D deficiency she is on supplementation vitamin D level was 33.2 on lab done earlier this week on 01/16/2017.  #9 history of osteoporosis she is on Fosamax-at times she does tend to get up and walk and she has been cautioned to stay in her wheelchair and call for help she expressed understanding today however with her dementia again I suspect at times this will continue to be a challenge.  #10 history of chronic anticoagulation on Xarelto hemoglobin appears to be stable at 12.9 on lab done earlier this week  on 01/16/2017  Of note Will update a metabolic panel since she is on Lasix-as well as potassium supplementation.  [id number]--of note greater than 35 minutes spent assessing patient-reviewing her chart including ophthalmology notes-reviewing her labs-iandcoordinating and formulating a plan of care for numerous diagnoses-of note greater than 50% of time spent coordinating a plan of care with input as noted above

## 2017-01-22 DIAGNOSIS — M810 Age-related osteoporosis without current pathological fracture: Secondary | ICD-10-CM | POA: Diagnosis not present

## 2017-01-22 DIAGNOSIS — F0391 Unspecified dementia with behavioral disturbance: Secondary | ICD-10-CM | POA: Diagnosis not present

## 2017-01-22 DIAGNOSIS — R41841 Cognitive communication deficit: Secondary | ICD-10-CM | POA: Diagnosis not present

## 2017-01-22 DIAGNOSIS — M6281 Muscle weakness (generalized): Secondary | ICD-10-CM | POA: Diagnosis not present

## 2017-01-22 DIAGNOSIS — R279 Unspecified lack of coordination: Secondary | ICD-10-CM | POA: Diagnosis not present

## 2017-01-22 DIAGNOSIS — F3289 Other specified depressive episodes: Secondary | ICD-10-CM | POA: Diagnosis not present

## 2017-01-23 ENCOUNTER — Encounter (HOSPITAL_COMMUNITY)
Admission: RE | Admit: 2017-01-23 | Discharge: 2017-01-23 | Disposition: A | Discharge status: Home / Self Care | Payer: Medicare Other | Source: Skilled Nursing Facility | Attending: *Deleted | Admitting: *Deleted

## 2017-01-23 DIAGNOSIS — R279 Unspecified lack of coordination: Secondary | ICD-10-CM | POA: Diagnosis not present

## 2017-01-23 DIAGNOSIS — M810 Age-related osteoporosis without current pathological fracture: Secondary | ICD-10-CM | POA: Diagnosis not present

## 2017-01-23 DIAGNOSIS — L0390 Cellulitis, unspecified: Secondary | ICD-10-CM | POA: Diagnosis not present

## 2017-01-23 DIAGNOSIS — M6281 Muscle weakness (generalized): Secondary | ICD-10-CM | POA: Diagnosis not present

## 2017-01-23 DIAGNOSIS — R41841 Cognitive communication deficit: Secondary | ICD-10-CM | POA: Diagnosis not present

## 2017-01-23 DIAGNOSIS — F0391 Unspecified dementia with behavioral disturbance: Secondary | ICD-10-CM | POA: Diagnosis not present

## 2017-01-23 DIAGNOSIS — I5042 Chronic combined systolic (congestive) and diastolic (congestive) heart failure: Secondary | ICD-10-CM | POA: Diagnosis not present

## 2017-01-23 DIAGNOSIS — F3289 Other specified depressive episodes: Secondary | ICD-10-CM | POA: Diagnosis not present

## 2017-01-23 LAB — BASIC METABOLIC PANEL
ANION GAP: 9 (ref 5–15)
BUN: 23 mg/dL — ABNORMAL HIGH (ref 6–20)
CHLORIDE: 100 mmol/L — AB (ref 101–111)
CO2: 28 mmol/L (ref 22–32)
Calcium: 8.8 mg/dL — ABNORMAL LOW (ref 8.9–10.3)
Creatinine, Ser: 0.87 mg/dL (ref 0.44–1.00)
GFR calc Af Amer: >60 mL/min (ref >60)
Glucose, Bld: 118 mg/dL — ABNORMAL HIGH (ref 65–99)
POTASSIUM: 3.9 mmol/L (ref 3.5–5.1)
Sodium: 137 mmol/L (ref 135–145)

## 2017-01-24 DIAGNOSIS — R279 Unspecified lack of coordination: Secondary | ICD-10-CM | POA: Diagnosis not present

## 2017-01-24 DIAGNOSIS — F0391 Unspecified dementia with behavioral disturbance: Secondary | ICD-10-CM | POA: Diagnosis not present

## 2017-01-24 DIAGNOSIS — F3289 Other specified depressive episodes: Secondary | ICD-10-CM | POA: Diagnosis not present

## 2017-01-24 DIAGNOSIS — M810 Age-related osteoporosis without current pathological fracture: Secondary | ICD-10-CM | POA: Diagnosis not present

## 2017-01-24 DIAGNOSIS — R41841 Cognitive communication deficit: Secondary | ICD-10-CM | POA: Diagnosis not present

## 2017-01-24 DIAGNOSIS — M6281 Muscle weakness (generalized): Secondary | ICD-10-CM | POA: Diagnosis not present

## 2017-01-25 DIAGNOSIS — Z89422 Acquired absence of other left toe(s): Secondary | ICD-10-CM | POA: Diagnosis not present

## 2017-01-25 DIAGNOSIS — R41841 Cognitive communication deficit: Secondary | ICD-10-CM | POA: Diagnosis not present

## 2017-01-25 DIAGNOSIS — M810 Age-related osteoporosis without current pathological fracture: Secondary | ICD-10-CM | POA: Diagnosis not present

## 2017-01-25 DIAGNOSIS — M6281 Muscle weakness (generalized): Secondary | ICD-10-CM | POA: Diagnosis not present

## 2017-01-25 DIAGNOSIS — F0391 Unspecified dementia with behavioral disturbance: Secondary | ICD-10-CM | POA: Diagnosis not present

## 2017-01-25 DIAGNOSIS — B351 Tinea unguium: Secondary | ICD-10-CM | POA: Diagnosis not present

## 2017-01-25 DIAGNOSIS — R279 Unspecified lack of coordination: Secondary | ICD-10-CM | POA: Diagnosis not present

## 2017-01-25 DIAGNOSIS — F3289 Other specified depressive episodes: Secondary | ICD-10-CM | POA: Diagnosis not present

## 2017-01-25 DIAGNOSIS — Z794 Long term (current) use of insulin: Secondary | ICD-10-CM | POA: Diagnosis not present

## 2017-01-25 DIAGNOSIS — E1140 Type 2 diabetes mellitus with diabetic neuropathy, unspecified: Secondary | ICD-10-CM | POA: Diagnosis not present

## 2017-01-26 DIAGNOSIS — R41841 Cognitive communication deficit: Secondary | ICD-10-CM | POA: Diagnosis not present

## 2017-01-26 DIAGNOSIS — F0391 Unspecified dementia with behavioral disturbance: Secondary | ICD-10-CM | POA: Diagnosis not present

## 2017-01-26 DIAGNOSIS — F3289 Other specified depressive episodes: Secondary | ICD-10-CM | POA: Diagnosis not present

## 2017-01-26 DIAGNOSIS — M6281 Muscle weakness (generalized): Secondary | ICD-10-CM | POA: Diagnosis not present

## 2017-01-26 DIAGNOSIS — R279 Unspecified lack of coordination: Secondary | ICD-10-CM | POA: Diagnosis not present

## 2017-01-26 DIAGNOSIS — M810 Age-related osteoporosis without current pathological fracture: Secondary | ICD-10-CM | POA: Diagnosis not present

## 2017-01-30 DIAGNOSIS — R41841 Cognitive communication deficit: Secondary | ICD-10-CM | POA: Diagnosis not present

## 2017-01-30 DIAGNOSIS — F0391 Unspecified dementia with behavioral disturbance: Secondary | ICD-10-CM | POA: Diagnosis not present

## 2017-01-30 DIAGNOSIS — F3289 Other specified depressive episodes: Secondary | ICD-10-CM | POA: Diagnosis not present

## 2017-01-31 DIAGNOSIS — F0391 Unspecified dementia with behavioral disturbance: Secondary | ICD-10-CM | POA: Diagnosis not present

## 2017-01-31 DIAGNOSIS — R41841 Cognitive communication deficit: Secondary | ICD-10-CM | POA: Diagnosis not present

## 2017-01-31 DIAGNOSIS — F3289 Other specified depressive episodes: Secondary | ICD-10-CM | POA: Diagnosis not present

## 2017-02-01 DIAGNOSIS — R41841 Cognitive communication deficit: Secondary | ICD-10-CM | POA: Diagnosis not present

## 2017-02-01 DIAGNOSIS — F0391 Unspecified dementia with behavioral disturbance: Secondary | ICD-10-CM | POA: Diagnosis not present

## 2017-02-01 DIAGNOSIS — F3289 Other specified depressive episodes: Secondary | ICD-10-CM | POA: Diagnosis not present

## 2017-02-02 DIAGNOSIS — F3289 Other specified depressive episodes: Secondary | ICD-10-CM | POA: Diagnosis not present

## 2017-02-02 DIAGNOSIS — F0391 Unspecified dementia with behavioral disturbance: Secondary | ICD-10-CM | POA: Diagnosis not present

## 2017-02-02 DIAGNOSIS — R41841 Cognitive communication deficit: Secondary | ICD-10-CM | POA: Diagnosis not present

## 2017-02-03 DIAGNOSIS — R41841 Cognitive communication deficit: Secondary | ICD-10-CM | POA: Diagnosis not present

## 2017-02-03 DIAGNOSIS — F3289 Other specified depressive episodes: Secondary | ICD-10-CM | POA: Diagnosis not present

## 2017-02-03 DIAGNOSIS — F0391 Unspecified dementia with behavioral disturbance: Secondary | ICD-10-CM | POA: Diagnosis not present

## 2017-02-07 DIAGNOSIS — R41841 Cognitive communication deficit: Secondary | ICD-10-CM | POA: Diagnosis not present

## 2017-02-07 DIAGNOSIS — F3289 Other specified depressive episodes: Secondary | ICD-10-CM | POA: Diagnosis not present

## 2017-02-07 DIAGNOSIS — F0391 Unspecified dementia with behavioral disturbance: Secondary | ICD-10-CM | POA: Diagnosis not present

## 2017-02-09 DIAGNOSIS — F3289 Other specified depressive episodes: Secondary | ICD-10-CM | POA: Diagnosis not present

## 2017-02-09 DIAGNOSIS — F0391 Unspecified dementia with behavioral disturbance: Secondary | ICD-10-CM | POA: Diagnosis not present

## 2017-02-09 DIAGNOSIS — R41841 Cognitive communication deficit: Secondary | ICD-10-CM | POA: Diagnosis not present

## 2017-02-11 DIAGNOSIS — R41841 Cognitive communication deficit: Secondary | ICD-10-CM | POA: Diagnosis not present

## 2017-02-11 DIAGNOSIS — F3289 Other specified depressive episodes: Secondary | ICD-10-CM | POA: Diagnosis not present

## 2017-02-11 DIAGNOSIS — F0391 Unspecified dementia with behavioral disturbance: Secondary | ICD-10-CM | POA: Diagnosis not present

## 2017-02-14 ENCOUNTER — Encounter: Payer: Self-pay | Admitting: Internal Medicine

## 2017-02-14 ENCOUNTER — Non-Acute Institutional Stay (SKILLED_NURSING_FACILITY): Payer: Medicare Other | Admitting: Internal Medicine

## 2017-02-14 DIAGNOSIS — F3341 Major depressive disorder, recurrent, in partial remission: Secondary | ICD-10-CM | POA: Diagnosis not present

## 2017-02-14 DIAGNOSIS — R600 Localized edema: Secondary | ICD-10-CM

## 2017-02-14 DIAGNOSIS — F0391 Unspecified dementia with behavioral disturbance: Secondary | ICD-10-CM | POA: Diagnosis not present

## 2017-02-14 DIAGNOSIS — I10 Essential (primary) hypertension: Secondary | ICD-10-CM | POA: Diagnosis not present

## 2017-02-14 DIAGNOSIS — E785 Hyperlipidemia, unspecified: Secondary | ICD-10-CM | POA: Diagnosis not present

## 2017-02-14 DIAGNOSIS — M810 Age-related osteoporosis without current pathological fracture: Secondary | ICD-10-CM

## 2017-02-14 DIAGNOSIS — E1140 Type 2 diabetes mellitus with diabetic neuropathy, unspecified: Secondary | ICD-10-CM | POA: Diagnosis not present

## 2017-02-14 DIAGNOSIS — R41841 Cognitive communication deficit: Secondary | ICD-10-CM | POA: Diagnosis not present

## 2017-02-14 DIAGNOSIS — Z794 Long term (current) use of insulin: Secondary | ICD-10-CM

## 2017-02-14 DIAGNOSIS — F3289 Other specified depressive episodes: Secondary | ICD-10-CM | POA: Diagnosis not present

## 2017-02-14 NOTE — Progress Notes (Signed)
Location:   [facility] Room Number: 113/W Place of Service:  SNF (31) Provider:  Jefm Bryant, MD  Patient Care Team: Celedonio Savage, MD as PCP - General (Family Medicine) Rothbart, Cristopher Estimable, MD (Cardiology) Lendon Colonel, NP as Nurse Practitioner (Nurse Practitioner)  Extended Emergency Contact Information Primary Emergency Contact: [facility] Address: [address]          [place], [place] [address] [name] of [place] Phone: [phone] Mobile Phone: [phone] Relation: Daughter  Code Status:  DNR Goals of care: Advanced Directive information Advanced Directives 02/14/2017  Does Patient Have a Medical Advance Directive? Yes  Type of Advance Directive Out of facility DNR (pink MOST or yellow form)  Does patient want to make changes to medical advance directive? No - Patient declined  Copy of [place] in Chart? No - copy requested  Pre-existing out of facility DNR order (yellow form or pink MOST form) -     Chief Complaint  Patient presents with  . Medical Management of Chronic Issues    Routine Visit    HPI:  Pt is a 76 y.o. female seen today for medical management of chronic diseases.    Patient has h/o Diabetes mellitus type 2 , atrial fibrillation on chronic anticoagulation, Venous stasis, Dementia with Behavior problems, S/P fall leading to Ocular floor fracture. Cellulitis oF LE.  Patient has been stable in the Facility. She has h/o Dementia. She did not have any new complains. She did says that she has not been sleeping well at night but says does not want to take anything for that. Her weight is stable at 207 lbs. No New Nursing issues. Eating well. Morning BS are running usually Less then 150.   Past Medical History:  Diagnosis Date  . Allergy   . Atrial fibrillation ([place]) 08/2011   First diagnosed in 08/2011; duration of arrhythmia is uncertain  . Bilateral lower extremity edema     . Chronic diarrhea    diverticulosis  . COPD (chronic obstructive pulmonary disease) ([place])   . Dementia   . Diabetes mellitus, type 2 (HCC)    Diabetic neuropathy  . Dyspnea on exertion    pedal edema  . Gout   . Headache(784.0)    twice weekly  . Hyperlipidemia   . Hypertension   . Osteopenia    DEXA scan 01/2010  . Palpitations   . Seasonal allergies   . Stress incontinence   . Vertigo    Past Surgical History:  Procedure Laterality Date  . APPENDECTOMY    . BREAST BIOPSY  2002   Right  . CESAREAN SECTION     x 2  . CHOLECYSTECTOMY    . COLONOSCOPY  2007   Negative screening study  . HAMMER TOE SURGERY     Bilateral hammer toe amputation  . INCISIONAL HERNIA REPAIR    . KNEE ARTHROSCOPY W/ MENISCAL REPAIR  2007   Bilateral  . UMBILICAL HERNIA REPAIR      Allergies  Allergen Reactions  . Codeine Anaphylaxis and Hives  . Morphine And Related Anaphylaxis and Hives  . Penicillins Anaphylaxis  . Ace Inhibitors Cough    Outpatient Encounter Prescriptions as of 02/14/2017  Medication Sig  . acetaminophen (TYLENOL) 325 MG tablet Take 650 mg by mouth every 6 (six) hours as needed.  Marland Kitchen alendronate (FOSAMAX) 70 MG tablet Give 1 tablet by mouth on Sunday. Take with a full glass of water on an  empty stomach.  . carboxymethylcellulose (REFRESH PLUS) 0.5 % SOLN Place 1 drop into both eyes 4 (four) times daily.  . cholecalciferol (VITAMIN D) 1000 UNITS tablet Take 1,000 Units by mouth daily.  . citalopram (CELEXA) 20 MG tablet Take 1 tablet (20 mg total) by mouth daily.  . digoxin (LANOXIN) 0.125 MG tablet Take 1 tablet by mouth once a day (HOLD FOR AP UNDER 60)  . docusate sodium (COLACE) 100 MG capsule Take 200 mg by mouth daily as needed.   . furosemide (LASIX) 20 MG tablet Take 60 mg by mouth.   . guaifenesin (HUMIBID E) 400 MG TABS tablet Take 400 mg by mouth every 6 (six) hours as needed.  . insulin NPH-regular Human (NOVOLIN 70/30) (70-30) 100 UNIT/ML injection Give  30 units subcutaneous once a evening  . insulin NPH-regular Human (NOVOLIN 70/30) (70-30) 100 UNIT/ML injection Give 55 units subcutaneous one a morning  . Insulin Pen Needle (EASY TOUCH PEN NEEDLES) 31G X 8 MM MISC Use twice a day  . losartan (COZAAR) 25 MG tablet Take 25 mg by mouth daily.  . memantine (NAMENDA) 10 MG tablet Take 10 mg by mouth 2 (two) times daily.  . metFORMIN (GLUCOPHAGE) 500 MG tablet Take 500 mg by mouth 2 (two) times daily with a meal.   . metoprolol (LOPRESSOR) 100 MG tablet Take 1 tablet (100 mg total) by mouth 2 (two) times daily.  . Multiple Vitamin (MULTIVITAMIN WITH MINERALS) TABS Take 1 tablet by mouth every morning.  Marland Kitchen POTASSIUM CHLORIDE ER PO Potassium chloride extended release capsule, give 20 meq by mouth once day  . pravastatin (PRAVACHOL) 40 MG tablet Take 1 tablet (40 mg total) by mouth every evening.  Alveda Reasons 20 MG TABS tablet TAKE (1) TABLET BY MOUTH ONCE DAILY.   No facility-administered encounter medications on file as of 02/14/2017.      Review of Systems  Unable to perform ROS: Dementia    Immunization History  Administered Date(s) Administered  . Influenza-Unspecified 02/04/2016  . Pneumococcal Conjugate-13 02/03/2017  . Pneumococcal-Unspecified 02/08/2016  . Tdap 01/19/2017   Pertinent  Health Maintenance Due  Topic Date Due  . OPHTHALMOLOGY EXAM  02/25/2017 (Originally 03/18/1951)  . INFLUENZA VACCINE  04/01/2017 (Originally 11/30/2016)  . URINE MICROALBUMIN  05/11/2017  . HEMOGLOBIN A1C  05/31/2017  . FOOT EXAM  01/25/2018  . DEXA SCAN  Completed  . PNA vac Low Risk Adult  Completed   Fall Risk  01/19/2017 11/06/2015 09/29/2015 08/31/2015 06/30/2015  Falls in the past year? No Yes No No No  Number falls in past yr: - 2 or more - - -  Injury with Fall? - No - - -   Functional Status Survey:    Vitals:   02/14/17 1213  BP: 121/74  Pulse: (!) 57  Resp: 20  Temp: (!) 97.2 F (36.2 C)  TempSrc: Oral  SpO2: 97%  Weight: 207 lb  12.8 oz (94.3 kg)  Height: 5\' 6"  (1.676 m)   Body mass index is 33.54 kg/m. Physical Exam  Constitutional: She appears well-developed and well-nourished.  HENT:  Head: Normocephalic.  Mouth/Throat: Oropharynx is clear and moist.  Eyes: Pupils are equal, round, and reactive to light.  Neck: Neck supple.  Cardiovascular: Normal rate.  An irregular rhythm present.  No murmur heard. Pulmonary/Chest: Effort normal and breath sounds normal. No respiratory distress. She has no wheezes. She has no rales.  Abdominal: Soft. Bowel sounds are normal. She exhibits no distension. There is no  tenderness. There is no rebound.  Musculoskeletal:  Moderate Edema Bilateral  Neurological: She is alert.  Skin: Skin is warm and dry.  Psychiatric: She has a normal mood and affect. Her behavior is normal.    Labs reviewed:  Recent Labs  01/13/17 0707 01/16/17 0645 01/23/17 0420  NA 138 138 137  K 3.8 4.1 3.9  CL 103 98* 100*  CO2 27 31 28   GLUCOSE 134* 142* 118*  BUN 32* 32* 23*  CREATININE 0.92 1.08* 0.87  CALCIUM 8.8* 9.7 8.8*    Recent Labs  05/12/16 0500 11/14/16 0730  AST 13* 15  ALT 13* 15  ALKPHOS 49 48  BILITOT 0.7 0.7  PROT 6.6 7.2  ALBUMIN 2.9* 3.2*    Recent Labs  11/14/16 0730  01/12/17 0726 01/13/17 0707 01/16/17 0645  WBC 6.6  < > 8.4 8.0 8.5  NEUTROABS 3.6  --   --   --   --   HGB 12.6  < > 12.9 12.6 12.9  HCT 39.4  < > 40.0 38.6 41.5  MCV 92.7  < > 92.4 92.3 93.5  PLT 231  < > 217 201 239  < > = values in this interval not displayed. Lab Results  Component Value Date   TSH 3.931 01/16/2017   Lab Results  Component Value Date   HGBA1C 6.5 (H) 11/28/2016   Lab Results  Component Value Date   CHOL 143 12/21/2016   HDL 36 (L) 12/21/2016   LDLCALC 92 12/21/2016   TRIG 77 12/21/2016   CHOLHDL 4.0 12/21/2016    Significant Diagnostic Results in last 30 days:  No results found.  Assessment/Plan  Chronic Atrial Fibrillation Doing well on Lopressor  , Digoxin and Xarelto Recheck Dig Level.Has been Low in the past.  Essential hypertension BP Controlled  Type 2 diabetes mellitus with diabetic neuropathy,  Repeat A1c. BS are well Controlled She is on Losartan for Microalbuminuria  LE Edema On Lasix 60 mg Weight stable at 207 lbs Has Compression Stockings.  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 Was tapered off trazodone Will try PRN Melatonin for sleeping issues.      Family/ staff Communication:   Labs/tests ordered:  BMP, A1c, Dig level  Total time spent in this patient care encounter was 25_ minutes; greater than 50% of the visit spent counseling patient, reviewing records , Labs and coordinating care for problems addressed at this encounter.

## 2017-02-15 ENCOUNTER — Encounter (HOSPITAL_COMMUNITY)
Admission: RE | Admit: 2017-02-15 | Discharge: 2017-02-15 | Disposition: A | Discharge status: Home / Self Care | Payer: Medicare Other | Source: Skilled Nursing Facility | Attending: Internal Medicine | Admitting: Internal Medicine

## 2017-02-15 DIAGNOSIS — M810 Age-related osteoporosis without current pathological fracture: Secondary | ICD-10-CM | POA: Insufficient documentation

## 2017-02-15 DIAGNOSIS — I5042 Chronic combined systolic (congestive) and diastolic (congestive) heart failure: Secondary | ICD-10-CM | POA: Diagnosis not present

## 2017-02-15 DIAGNOSIS — F3289 Other specified depressive episodes: Secondary | ICD-10-CM | POA: Diagnosis not present

## 2017-02-15 DIAGNOSIS — M6281 Muscle weakness (generalized): Secondary | ICD-10-CM | POA: Insufficient documentation

## 2017-02-15 DIAGNOSIS — R279 Unspecified lack of coordination: Secondary | ICD-10-CM | POA: Diagnosis not present

## 2017-02-15 DIAGNOSIS — R41841 Cognitive communication deficit: Secondary | ICD-10-CM | POA: Insufficient documentation

## 2017-02-15 DIAGNOSIS — F0391 Unspecified dementia with behavioral disturbance: Secondary | ICD-10-CM | POA: Diagnosis not present

## 2017-02-15 LAB — BASIC METABOLIC PANEL
Anion gap: 8 (ref 5–15)
BUN: 27 mg/dL — AB (ref 6–20)
CHLORIDE: 99 mmol/L — AB (ref 101–111)
CO2: 31 mmol/L (ref 22–32)
Calcium: 9 mg/dL (ref 8.9–10.3)
Creatinine, Ser: 1.03 mg/dL — ABNORMAL HIGH (ref 0.44–1.00)
GFR calc Af Amer: 60 mL/min — ABNORMAL LOW (ref >60)
GFR calc non Af Amer: 51 mL/min — ABNORMAL LOW (ref >60)
GLUCOSE: 130 mg/dL — AB (ref 65–99)
POTASSIUM: 4.1 mmol/L (ref 3.5–5.1)
Sodium: 138 mmol/L (ref 135–145)

## 2017-02-15 LAB — HEMOGLOBIN A1C
Hgb A1c MFr Bld: 7.1 % — ABNORMAL HIGH (ref 4.8–5.6)
Mean Plasma Glucose: 157.07 mg/dL

## 2017-02-15 LAB — DIGOXIN LEVEL: Digoxin Level: 0.6 ng/mL — ABNORMAL LOW (ref 0.8–2.0)

## 2017-02-16 DIAGNOSIS — R41841 Cognitive communication deficit: Secondary | ICD-10-CM | POA: Diagnosis not present

## 2017-02-16 DIAGNOSIS — F3289 Other specified depressive episodes: Secondary | ICD-10-CM | POA: Diagnosis not present

## 2017-02-16 DIAGNOSIS — F0391 Unspecified dementia with behavioral disturbance: Secondary | ICD-10-CM | POA: Diagnosis not present

## 2017-02-21 DIAGNOSIS — F0391 Unspecified dementia with behavioral disturbance: Secondary | ICD-10-CM | POA: Diagnosis not present

## 2017-02-21 DIAGNOSIS — F3289 Other specified depressive episodes: Secondary | ICD-10-CM | POA: Diagnosis not present

## 2017-02-21 DIAGNOSIS — Z7984 Long term (current) use of oral hypoglycemic drugs: Secondary | ICD-10-CM | POA: Diagnosis not present

## 2017-02-21 DIAGNOSIS — E113293 Type 2 diabetes mellitus with mild nonproliferative diabetic retinopathy without macular edema, bilateral: Secondary | ICD-10-CM | POA: Diagnosis not present

## 2017-02-21 DIAGNOSIS — R41841 Cognitive communication deficit: Secondary | ICD-10-CM | POA: Diagnosis not present

## 2017-02-21 DIAGNOSIS — Z7901 Long term (current) use of anticoagulants: Secondary | ICD-10-CM | POA: Diagnosis not present

## 2017-02-21 DIAGNOSIS — Z794 Long term (current) use of insulin: Secondary | ICD-10-CM | POA: Diagnosis not present

## 2017-02-22 DIAGNOSIS — F0391 Unspecified dementia with behavioral disturbance: Secondary | ICD-10-CM | POA: Diagnosis not present

## 2017-02-22 DIAGNOSIS — R41841 Cognitive communication deficit: Secondary | ICD-10-CM | POA: Diagnosis not present

## 2017-02-22 DIAGNOSIS — F3289 Other specified depressive episodes: Secondary | ICD-10-CM | POA: Diagnosis not present

## 2017-03-07 ENCOUNTER — Non-Acute Institutional Stay (SKILLED_NURSING_FACILITY): Payer: Medicare Other | Admitting: Internal Medicine

## 2017-03-07 ENCOUNTER — Encounter: Payer: Self-pay | Admitting: Internal Medicine

## 2017-03-07 DIAGNOSIS — I10 Essential (primary) hypertension: Secondary | ICD-10-CM | POA: Diagnosis not present

## 2017-03-07 DIAGNOSIS — E785 Hyperlipidemia, unspecified: Secondary | ICD-10-CM

## 2017-03-07 DIAGNOSIS — R600 Localized edema: Secondary | ICD-10-CM

## 2017-03-07 DIAGNOSIS — E11319 Type 2 diabetes mellitus with unspecified diabetic retinopathy without macular edema: Secondary | ICD-10-CM | POA: Diagnosis not present

## 2017-03-07 DIAGNOSIS — I482 Chronic atrial fibrillation, unspecified: Secondary | ICD-10-CM

## 2017-03-07 DIAGNOSIS — F0391 Unspecified dementia with behavioral disturbance: Secondary | ICD-10-CM

## 2017-03-07 NOTE — Progress Notes (Signed)
Location:   [facility] Room Number: 113/W Place of Service:  SNF (31) Provider:  Dionicia Abler, MD  Patient Care Team: Celedonio Savage, MD as PCP - General (Family Medicine) Rothbart, Cristopher Estimable, MD (Cardiology) Lendon Colonel, NP as Nurse Practitioner (Nurse Practitioner)  Extended Emergency Contact Information Primary Emergency Contact: [facility] Address: [address]          [place], [place] [address] [name] of [place] Phone: [phone] Mobile Phone: [phone] Relation: Daughter  Code Status:  DNR Goals of care: Advanced Directive information Advanced Directives 03/07/2017  Does Patient Have a Medical Advance Directive? Yes  Type of Advance Directive Out of facility DNR (pink MOST or yellow form)  Does patient want to make changes to medical advance directive? No - Patient declined  Copy of [place] in Chart? No - copy requested  Pre-existing out of facility DNR order (yellow form or pink MOST form) -     Routine visit for medical management of chronic medical issues including dementia-atrial fibrillation-diabetes type 2-chronic venous stasis-hypertension-osteoporosis-depression-vitamin D deficiency-hyperlipidemia-also dyspnea history of ocular floor fracture-  HPI:  Pt is a 76 y.o. female seen today for medical management of chronic diseases.as noted above-she has had a period of extended stability nursing staff does not report any acute concerns-however there is some thought she may have some increased lower extremity edema on the right leg-she has had venous Dopplers before which apparently were negative but will update this rule out any change.  She is not complaining of any chest pain or increased shortness of breath her weight has been relatively stable at 210 pounds she is on Lasix 60 mg a day with potassium supplementation.  She  does have a history of diabetes type 2 she is on Glucophage 500 mg  twice a day as well as Novolin-70-30 twice a day 55 units in the morning and 30 units in the p.m.-blood sugars in the morning are usually 150 or below-there is more variability later in the day ranging from the mid 100s to lower 200swith occasional readings in the high 200s-- apparently there is some snacking involved here-hemoglobin A1c was 7.1 on lab done last month this was up  from 6.5 previously.  She does have a history of atrial fibrillation which is rate controlled on Lopressor as well as digoxin she is on Xarelto for anticoagulation.  She also has a history of hypertension which is well-controlled on current medications including Lopressor losartan-blood pressures 130/85-121/74-120/77 most recently pulses appear to be more in the 60s-70s rangeoccasionally in the 50s but this does not appear to be consistent    In regards to history of diabetic retinopathy she is followed closely by ophthalmology seen last on October 23-retinopathy was described as mild with no treatment at this time indicating.  Currently she is sitting in her wheelchair comfortably is pleasant alert smiling somewhat confused but at her baseline   Past Medical History:  Diagnosis Date  . Allergy   . Atrial fibrillation ([place]) 08/2011   First diagnosed in 08/2011; duration of arrhythmia is uncertain  . Bilateral lower extremity edema   . Chronic diarrhea    diverticulosis  . COPD (chronic obstructive pulmonary disease) ([place])   . Dementia   . Diabetes mellitus, type 2 (HCC)    Diabetic neuropathy  . Dyspnea on exertion    pedal edema  . Gout   . Headache(784.0)    twice weekly  . Hyperlipidemia   .  Hypertension   . Osteopenia    DEXA scan 01/2010  . Palpitations   . Seasonal allergies   . Stress incontinence   . Vertigo    Past Surgical History:  Procedure Laterality Date  . APPENDECTOMY    . BREAST BIOPSY  2002   Right  . CESAREAN SECTION     x 2  . CHOLECYSTECTOMY    . COLONOSCOPY  2007   Negative  screening study  . HAMMER TOE SURGERY     Bilateral hammer toe amputation  . INCISIONAL HERNIA REPAIR    . KNEE ARTHROSCOPY W/ MENISCAL REPAIR  2007   Bilateral  . UMBILICAL HERNIA REPAIR      Allergies  Allergen Reactions  . Codeine Anaphylaxis and Hives  . Morphine And Related Anaphylaxis and Hives  . Penicillins Anaphylaxis  . Ace Inhibitors Cough    Outpatient Encounter Medications as of 03/07/2017  Medication Sig  . acetaminophen (TYLENOL) 325 MG tablet Take 650 mg by mouth every 6 (six) hours as needed.  Marland Kitchen alendronate (FOSAMAX) 70 MG tablet Give 1 tablet by mouth on Sunday. Take with a full glass of water on an empty stomach.  . carboxymethylcellulose (REFRESH PLUS) 0.5 % SOLN Place 1 drop into both eyes 4 (four) times daily.  . cholecalciferol (VITAMIN D) 1000 UNITS tablet Take 1,000 Units by mouth daily.  . citalopram (CELEXA) 20 MG tablet Take 1 tablet (20 mg total) by mouth daily.  . digoxin (LANOXIN) 0.125 MG tablet Take 1 tablet by mouth once a day (HOLD FOR AP UNDER 60)  . docusate sodium (COLACE) 100 MG capsule Take 200 mg by mouth daily as needed.   . furosemide (LASIX) 20 MG tablet Take 60 mg by mouth.   . guaifenesin (HUMIBID E) 400 MG TABS tablet Take 400 mg by mouth every 6 (six) hours as needed.  . insulin NPH-regular Human (NOVOLIN 70/30) (70-30) 100 UNIT/ML injection Give 30 units subcutaneous once a evening  . insulin NPH-regular Human (NOVOLIN 70/30) (70-30) 100 UNIT/ML injection Give 55 units subcutaneous one a morning  . Insulin Pen Needle (EASY TOUCH PEN NEEDLES) 31G X 8 MM MISC Use twice a day  . losartan (COZAAR) 25 MG tablet Take 25 mg by mouth daily.  . memantine (NAMENDA) 10 MG tablet Take 10 mg by mouth 2 (two) times daily.  . metFORMIN (GLUCOPHAGE) 500 MG tablet Take 500 mg by mouth 2 (two) times daily with a meal.   . metoprolol (LOPRESSOR) 100 MG tablet Take 1 tablet (100 mg total) by mouth 2 (two) times daily.  . Multiple Vitamin (MULTIVITAMIN  WITH MINERALS) TABS Take 1 tablet by mouth every morning.  Marland Kitchen POTASSIUM CHLORIDE ER PO Potassium chloride extended release capsule, give 20 meq by mouth once day  . pravastatin (PRAVACHOL) 40 MG tablet Take 1 tablet (40 mg total) by mouth every evening.  Alveda Reasons 20 MG TABS tablet TAKE (1) TABLET BY MOUTH ONCE DAILY.   No facility-administered encounter medications on file as of 03/07/2017.      Review of Systems   This is limited secondary to dementia but when asked she is not complaining of any chest pain or shortness of breath-she eats well does not complaining of any abdominal discomfort-per nursing appears to be at her baseline and stable  Immunization History  Administered Date(s) Administered  . Influenza-Unspecified 02/04/2016, 02/03/2017  . Pneumococcal Conjugate-13 02/03/2017  . Pneumococcal-Unspecified 02/08/2016  . Tdap 01/19/2017   Pertinent  Health Maintenance Due  Topic Date Due  . OPHTHALMOLOGY EXAM  04/01/2017 (Originally 03/18/1951)  . URINE MICROALBUMIN  05/11/2017  . HEMOGLOBIN A1C  08/16/2017  . FOOT EXAM  01/25/2018  . INFLUENZA VACCINE  Completed  . DEXA SCAN  Completed  . PNA vac Low Risk Adult  Completed   Fall Risk  01/19/2017 11/06/2015 09/29/2015 08/31/2015 06/30/2015  Falls in the past year? No Yes No No No  Number falls in past yr: - 2 or more - - -  Injury with Fall? - No - - -   Functional Status Survey:    Vitals:   03/07/17 1407  BP: 130/85  Pulse: 82  Resp: 20  Temp: 98 F (36.7 C)  TempSrc: Oral  SpO2: 97%  Weight: 210 lb 3.2 oz (95.3 kg)  Height: 5\' 6"  (1.676 m)   Body mass index is 33.93 kg/m. Physical Exam   In general this continues to be a very pleasant elderly female in no distress who is sitting comfortably in her wheelchair.  Her skin is warm and dry I do not note any increase in erythema of her lower extremities bilaterally beyond baseline.  Eyes pupils appear reactive to light visual acuity appears grossly intact sclera  and conjunctiva are clear.  Oropharynx is clear mucous membranes moiChest is clear to auscultation there is no labored breathing.  Heart is regular irregular rate and rhythm without murmur gallop or rub she has compression hose on appears to have chronic venous stasis-although somewhat increased on the right versus the left-pedal pulses are reduced bilaterally-there is no increased erythema from baseline her legs are cool to touch.  Abdomen is obese soft nontender with positive bowel sounds.  Musculoskeletal does move all extremities 4 with lower extremity weakness with a history of significant venous stasis-currently has compression hose on.  Upper extremity strength appears preserved.  Neurologic as noted above her cranial nerves are intact her speech is clear she is alert-no lateralizing findings  Psych she is oriented to self pleasant and appropriate follows verbal commands without difficulty   Labs reviewed: Recent Labs    01/16/17 0645 01/23/17 0420 02/15/17 0731  NA 138 137 138  K 4.1 3.9 4.1  CL 98* 100* 99*  CO2 31 28 31   GLUCOSE 142* 118* 130*  BUN 32* 23* 27*  CREATININE 1.08* 0.87 1.03*  CALCIUM 9.7 8.8* 9.0   Recent Labs    05/12/16 0500 11/14/16 0730  AST 13* 15  ALT 13* 15  ALKPHOS 49 48  BILITOT 0.7 0.7  PROT 6.6 7.2  ALBUMIN 2.9* 3.2*   Recent Labs    11/14/16 0730  01/12/17 0726 01/13/17 0707 01/16/17 0645  WBC 6.6   < > 8.4 8.0 8.5  NEUTROABS 3.6  --   --   --   --   HGB 12.6   < > 12.9 12.6 12.9  HCT 39.4   < > 40.0 38.6 41.5  MCV 92.7   < > 92.4 92.3 93.5  PLT 231   < > 217 201 239   < > = values in this interval not displayed.   Lab Results  Component Value Date   TSH 3.931 01/16/2017   Lab Results  Component Value Date   HGBA1C 7.1 (H) 02/15/2017   Lab Results  Component Value Date   CHOL 143 12/21/2016   HDL 36 (L) 12/21/2016   LDLCALC 92 12/21/2016   TRIG 77 12/21/2016   CHOLHDL 4.0 12/21/2016    Significant Diagnostic  Results  in last 30 days:  No results found.  Assessment/Plan    #1-history of venous stasisedema-she possibly may have ome increase edema on the right althoughper nursing this is not drastically change-however will rule out a DVT by ordering a bilateral Dopplers clinically she appears to be stable weight has been relatively stable she is on Lasix with potassium  #2 diabetes type 2-she is on Glucophage as well as Novolin 70-30 twice a day hemoglobin A1c has crept up- has frequent readings 200+ later in the day--emphasis is on tight diabetic control with her history of retinopathy-this was discussed with Dr. Lyndel Safe and we'll discontinue the Glucophage and startJanumet 50-500 twice a day and monitor.  #3-atrial fibrillation this appears rate controlled onLopressor as well as digoxin-digoxin level was 0.6 on lab which appears to be relatively stable-she is on Xarelto for anticoagulatio.  #4-history of dementia with behaviors this is been stable for some timeweight is stable appetite appears to be good I have not noted any recent behaviors-she is on Namenda.  #5 history ofhypertension this is stable as noted above on losartan as well as Lopressor.  #6 history of osteoporosis she is on Fosamax.  #7 history of vitamin D deficiency she is on supplementation vitamin D level was 33.2 in September 2019.  #8 history of hyperlipidemia continues on Pravachol LDL was 92 on lab done in August 2018  #9 history depression this is quite stable on Celexa continues to be bright alert pleasant interactive.  #10 history appears to h largely resolvegain she is followed by ophthalmology.  Again will order venous Doppler bilaterally to rule out any DVT-also will change her diabetic medicine to Janumet50-500 twice a day and discontinue the Glucophage continue the Novolin-70-30 current doses 55 units in the morning and 30 units in the eveving  [id number]-of note greater than 40 minutes spent assessing patient reviewing  her chart-reviewing her labs-discussing her status with nursing as well as with Dr. Corrinne Eagle coordinating and formulating a plan of care for numerous diagnoses-of note greater than 50% of time spent coordinating plan of care

## 2017-03-09 DIAGNOSIS — R600 Localized edema: Secondary | ICD-10-CM | POA: Diagnosis not present

## 2017-04-04 ENCOUNTER — Non-Acute Institutional Stay (SKILLED_NURSING_FACILITY): Payer: Medicare Other | Admitting: Internal Medicine

## 2017-04-04 ENCOUNTER — Encounter: Payer: Self-pay | Admitting: Internal Medicine

## 2017-04-04 DIAGNOSIS — R609 Edema, unspecified: Secondary | ICD-10-CM | POA: Diagnosis not present

## 2017-04-04 DIAGNOSIS — L03115 Cellulitis of right lower limb: Secondary | ICD-10-CM | POA: Diagnosis not present

## 2017-04-04 DIAGNOSIS — Z794 Long term (current) use of insulin: Secondary | ICD-10-CM

## 2017-04-04 DIAGNOSIS — E1140 Type 2 diabetes mellitus with diabetic neuropathy, unspecified: Secondary | ICD-10-CM

## 2017-04-04 NOTE — Progress Notes (Signed)
Location:   [facility] Room Number: 322G Place of Service:  SNF [phone]) Provider:  Dionicia Abler, MD  Patient Care Team: Celedonio Savage, MD as PCP - General (Family Medicine) Rothbart, Cristopher Estimable, MD (Cardiology) Lendon Colonel, NP as Nurse Practitioner (Nurse Practitioner)  Extended Emergency Contact Information Primary Emergency Contact: [facility] Address: [address]          [place], [place] [address] [name] of [place] Phone: [phone] Mobile Phone: [phone] Relation: Daughter  Code Status:  DNR Goals of care: Advanced Directive information Advanced Directives 04/04/2017  Does Patient Have a Medical Advance Directive? Yes  Type of Advance Directive Out of facility DNR (pink MOST or yellow form)  Does patient want to make changes to medical advance directive? No - Patient declined  Copy of [place] in Chart? No - copy requested  Pre-existing out of facility DNR order (yellow form or pink MOST form) -     Chief Complaint  Patient presents with  . Acute Visit    Right Leg Redness + Iching     HPI:  Pt is a 76 y.o. female seen today for an acute visit for increased right lower leg erythema.  Apparently patient has been scratching her leg medial right lower leg- there appears to be some small scabs as well as some pale erythema with slight warmth above her medial right ankle extending slightly into her lower right leg.  She is afebrile she is not complaining of any significant pain or tenderness apparently the area does get some.  Her other medical issues appear to be relatively stable--she does have a history of bilateral venous stasis leg edema recent Dopplers in early November were negative for any DVT she is on Lasix 60 mg a day with potassium supplementation.  She also has a history of type 2 diabetes hemoglobin A1c on October lab was up to 7.1 increased from 6.5 previously- she is on Novolin  70/30 --55 units in the a.m. 30 units in the p.m. secondary to the rising hemoglobin A1c we did discontinue her Glucophage and started her on Janumet 50-500 twice daily.  -Blood sugars appear to be somewhat improved ranging in the morning from 76 up to 176 although readings below 90 are quite rare last 2 weeks he only actually see 2 readings below 90-mid days blood sugars are more in the mid 100s and at night there is a bit more variability ranging from mid 100s to occasionally above 200- overall I would say her blood sugars average in the mid 100s which appears to be an improvement.  She also has a history of atrial fibrillation which appears rate controlled on Lopressor she is on Eliquis for anticoagulation regards to dementia this appears stable on supportive care dementia I would say is mild to moderate she is pleasant cooperative which is her baseline she is on Namenda   Past Medical History:  Diagnosis Date  . Allergy   . Atrial fibrillation ([place] [facility]) 08/2011   First diagnosed in 08/2011; duration of arrhythmia is uncertain  . Bilateral lower extremity edema   . Chronic diarrhea    diverticulosis  . COPD (chronic obstructive pulmonary disease) ([facility])   . Dementia   . Diabetes mellitus, type 2 (HCC)    Diabetic neuropathy  . Dyspnea on exertion    pedal edema  . Gout   . Headache(784.0)    twice weekly  . Hyperlipidemia   . Hypertension   .  Osteopenia    DEXA scan 01/2010  . Palpitations   . Seasonal allergies   . Stress incontinence   . Vertigo    Past Surgical History:  Procedure Laterality Date  . APPENDECTOMY    . BREAST BIOPSY  2002   Right  . CESAREAN SECTION     x 2  . CHOLECYSTECTOMY    . COLONOSCOPY  2007   Negative screening study  . HAMMER TOE SURGERY     Bilateral hammer toe amputation  . INCISIONAL HERNIA REPAIR    . KNEE ARTHROSCOPY W/ MENISCAL REPAIR  2007   Bilateral  . UMBILICAL HERNIA REPAIR      Allergies  Allergen Reactions  . Codeine Anaphylaxis  and Hives  . Morphine And Related Anaphylaxis and Hives  . Penicillins Anaphylaxis  . Ace Inhibitors Cough    Outpatient Encounter Medications as of 04/04/2017  Medication Sig  . acetaminophen (TYLENOL) 325 MG tablet Take 650 mg by mouth every 6 (six) hours as needed.  Marland Kitchen alendronate (FOSAMAX) 70 MG tablet Give 1 tablet by mouth on Sunday. Take with a full glass of water on an empty stomach.  . carboxymethylcellulose (REFRESH PLUS) 0.5 % SOLN Place 1 drop into both eyes 4 (four) times daily.  . cholecalciferol (VITAMIN D) 1000 UNITS tablet Take 1,000 Units by mouth daily.  . citalopram (CELEXA) 20 MG tablet Take 1 tablet (20 mg total) by mouth daily.  . digoxin (LANOXIN) 0.125 MG tablet Take 1 tablet by mouth once a day (HOLD FOR AP UNDER 60)  . docusate sodium (COLACE) 100 MG capsule Take 200 mg by mouth daily as needed.   . furosemide (LASIX) 20 MG tablet Take 60 mg by mouth.   . guaifenesin (HUMIBID E) 400 MG TABS tablet Take 400 mg by mouth every 6 (six) hours as needed.  . insulin NPH-regular Human (NOVOLIN 70/30) (70-30) 100 UNIT/ML injection Give 30 units subcutaneous once a evening  . insulin NPH-regular Human (NOVOLIN 70/30) (70-30) 100 UNIT/ML injection Give 55 units subcutaneous one a morning  . Insulin Pen Needle (EASY TOUCH PEN NEEDLES) 31G X 8 MM MISC Use twice a day  . losartan (COZAAR) 25 MG tablet Take 25 mg by mouth daily.  . memantine (NAMENDA) 10 MG tablet Take 10 mg by mouth 2 (two) times daily.  . metoprolol (LOPRESSOR) 100 MG tablet Take 1 tablet (100 mg total) by mouth 2 (two) times daily.  . Multiple Vitamin (MULTIVITAMIN WITH MINERALS) TABS Take 1 tablet by mouth every morning.  Marland Kitchen POTASSIUM CHLORIDE ER PO Potassium chloride extended release capsule, give 20 meq by mouth once day  . pravastatin (PRAVACHOL) 40 MG tablet Take 1 tablet (40 mg total) by mouth every evening.  . sitaGLIPtin-metformin (JANUMET) 50-500 MG tablet Take 1 tablet by mouth 2 (two) times daily with  a meal.  . XARELTO 20 MG TABS tablet TAKE (1) TABLET BY MOUTH ONCE DAILY.  . [DISCONTINUED] metFORMIN (GLUCOPHAGE) 500 MG tablet Take 500 mg by mouth 2 (two) times daily with a meal.    No facility-administered encounter medications on file as of 04/04/2017.     Review of Systems   This is limited secondary to dementia please see HPI she is not complaining of any shortness of breath chest pain or significant muscle skeletal pain is not complaining of dizziness or headache- per nursing edema appears to be at baseline  Immunization History  Administered Date(s) Administered  . Influenza-Unspecified 02/04/2016, 02/03/2017  . Pneumococcal Conjugate-13 02/03/2017  .  Pneumococcal-Unspecified 02/08/2016  . Tdap 01/19/2017   Pertinent  Health Maintenance Due  Topic Date Due  . OPHTHALMOLOGY EXAM  03/18/1951  . URINE MICROALBUMIN  05/11/2017  . HEMOGLOBIN A1C  08/16/2017  . FOOT EXAM  01/25/2018  . INFLUENZA VACCINE  Completed  . DEXA SCAN  Completed  . PNA vac Low Risk Adult  Completed   Fall Risk  01/19/2017 11/06/2015 09/29/2015 08/31/2015 06/30/2015  Falls in the past year? No Yes No No No  Number falls in past yr: - 2 or more - - -  Injury with Fall? - No - - -   Functional Status Survey:    Vitals:   04/04/17 1201  BP: 117/76  Pulse: 74  Resp: 20  Temp: (!) 97 F (36.1 C)  TempSrc: Oral   Weight is 211 pounds which appears relatively stable Physical Exam   In general this is a pleasant elderly female in no distress she is well-nourished.  Her skin is warm and dry I do not above her right medial ankle there is an area of pale erythema and slight warmth there also are some small scabs-this erythema extends slightly into her posterior lower right leg there is no drainage or bleeding.  Eyes visual acuity appears grossly intact sclera are clear.  Oropharynx is clear mucous membranes moist.  Chest is clear to auscultation there is no labored breathing.  Heart is regular rate  and rhythm without murmur gallop or rub somewhat distant heart sounds she has chronic venous stasis changes I would say 2+ bilaterally.  Abdomen is obese soft nontender with positive bowel sounds.  Musculoskeletal moves all extremities x4 at baseline with some lower extremity weakness which is not new as her venous stasis changes which appears to be stable.  Neurologic is grossly intact no lateralizing findings her speech is clear.  Psych she is oriented to self pleasant and appropriate   Labs reviewed: Recent Labs    01/16/17 0645 01/23/17 0420 02/15/17 0731  NA 138 137 138  K 4.1 3.9 4.1  CL 98* 100* 99*  CO2 31 28 31   GLUCOSE 142* 118* 130*  BUN 32* 23* 27*  CREATININE 1.08* 0.87 1.03*  CALCIUM 9.7 8.8* 9.0   Recent Labs    05/12/16 0500 11/14/16 0730  AST 13* 15  ALT 13* 15  ALKPHOS 49 48  BILITOT 0.7 0.7  PROT 6.6 7.2  ALBUMIN 2.9* 3.2*   Recent Labs    11/14/16 0730  01/12/17 0726 01/13/17 0707 01/16/17 0645  WBC 6.6   < > 8.4 8.0 8.5  NEUTROABS 3.6  --   --   --   --   HGB 12.6   < > 12.9 12.6 12.9  HCT 39.4   < > 40.0 38.6 41.5  MCV 92.7   < > 92.4 92.3 93.5  PLT 231   < > 217 201 239   < > = values in this interval not displayed.   Lab Results  Component Value Date   TSH 3.931 01/16/2017   Lab Results  Component Value Date   HGBA1C 7.1 (H) 02/15/2017   Lab Results  Component Value Date   CHOL 143 12/21/2016   HDL 36 (L) 12/21/2016   LDLCALC 92 12/21/2016   TRIG 77 12/21/2016   CHOLHDL 4.0 12/21/2016    Significant Diagnostic Results in last 30 days:  No results found.  Assessment/Plan  #1-right lower leg erythema-with the scabbing would be somewhat concerned somewhat about a slowly  developing cellulitis-will treat aggressively with doxycycline 100 mg twice daily for 7 days and monitor for any changes-culture any drainage.  2.-History of diabetes type 2 in addition of Janumet appears to be helping blood sugars largely in the mid 100s  rare readings below 90 occasionally later in the day readings above 200 but these are not consistent-at this point will monitor-she continues on Novolin 7030 as well twice daily.  3.  History of venous stasis edema this appears stable weight appears to be stable recent Dopplers last month were negative for any DVT continue Lasix and potassium at current doses.  [id number]

## 2017-04-06 DIAGNOSIS — R41841 Cognitive communication deficit: Secondary | ICD-10-CM | POA: Diagnosis not present

## 2017-04-06 DIAGNOSIS — F3289 Other specified depressive episodes: Secondary | ICD-10-CM | POA: Diagnosis not present

## 2017-04-06 DIAGNOSIS — F0391 Unspecified dementia with behavioral disturbance: Secondary | ICD-10-CM | POA: Diagnosis not present

## 2017-04-06 DIAGNOSIS — M810 Age-related osteoporosis without current pathological fracture: Secondary | ICD-10-CM | POA: Diagnosis not present

## 2017-04-06 DIAGNOSIS — R279 Unspecified lack of coordination: Secondary | ICD-10-CM | POA: Diagnosis not present

## 2017-04-06 DIAGNOSIS — R262 Difficulty in walking, not elsewhere classified: Secondary | ICD-10-CM | POA: Diagnosis not present

## 2017-04-07 DIAGNOSIS — R41841 Cognitive communication deficit: Secondary | ICD-10-CM | POA: Diagnosis not present

## 2017-04-07 DIAGNOSIS — R262 Difficulty in walking, not elsewhere classified: Secondary | ICD-10-CM | POA: Diagnosis not present

## 2017-04-07 DIAGNOSIS — R279 Unspecified lack of coordination: Secondary | ICD-10-CM | POA: Diagnosis not present

## 2017-04-07 DIAGNOSIS — F3289 Other specified depressive episodes: Secondary | ICD-10-CM | POA: Diagnosis not present

## 2017-04-07 DIAGNOSIS — F0391 Unspecified dementia with behavioral disturbance: Secondary | ICD-10-CM | POA: Diagnosis not present

## 2017-04-07 DIAGNOSIS — M810 Age-related osteoporosis without current pathological fracture: Secondary | ICD-10-CM | POA: Diagnosis not present

## 2017-04-11 DIAGNOSIS — M810 Age-related osteoporosis without current pathological fracture: Secondary | ICD-10-CM | POA: Diagnosis not present

## 2017-04-11 DIAGNOSIS — R262 Difficulty in walking, not elsewhere classified: Secondary | ICD-10-CM | POA: Diagnosis not present

## 2017-04-11 DIAGNOSIS — R279 Unspecified lack of coordination: Secondary | ICD-10-CM | POA: Diagnosis not present

## 2017-04-11 DIAGNOSIS — F3289 Other specified depressive episodes: Secondary | ICD-10-CM | POA: Diagnosis not present

## 2017-04-11 DIAGNOSIS — F0391 Unspecified dementia with behavioral disturbance: Secondary | ICD-10-CM | POA: Diagnosis not present

## 2017-04-11 DIAGNOSIS — R41841 Cognitive communication deficit: Secondary | ICD-10-CM | POA: Diagnosis not present

## 2017-04-12 ENCOUNTER — Encounter: Payer: Self-pay | Admitting: Internal Medicine

## 2017-04-12 ENCOUNTER — Non-Acute Institutional Stay (SKILLED_NURSING_FACILITY): Payer: Medicare Other | Admitting: Internal Medicine

## 2017-04-12 DIAGNOSIS — L03119 Cellulitis of unspecified part of limb: Secondary | ICD-10-CM | POA: Diagnosis not present

## 2017-04-12 DIAGNOSIS — M810 Age-related osteoporosis without current pathological fracture: Secondary | ICD-10-CM | POA: Diagnosis not present

## 2017-04-12 DIAGNOSIS — R262 Difficulty in walking, not elsewhere classified: Secondary | ICD-10-CM | POA: Diagnosis not present

## 2017-04-12 DIAGNOSIS — R41841 Cognitive communication deficit: Secondary | ICD-10-CM | POA: Diagnosis not present

## 2017-04-12 DIAGNOSIS — F3289 Other specified depressive episodes: Secondary | ICD-10-CM | POA: Diagnosis not present

## 2017-04-12 DIAGNOSIS — F0391 Unspecified dementia with behavioral disturbance: Secondary | ICD-10-CM | POA: Diagnosis not present

## 2017-04-12 DIAGNOSIS — Z794 Long term (current) use of insulin: Secondary | ICD-10-CM | POA: Diagnosis not present

## 2017-04-12 DIAGNOSIS — E1140 Type 2 diabetes mellitus with diabetic neuropathy, unspecified: Secondary | ICD-10-CM

## 2017-04-12 DIAGNOSIS — R279 Unspecified lack of coordination: Secondary | ICD-10-CM | POA: Diagnosis not present

## 2017-04-12 DIAGNOSIS — R600 Localized edema: Secondary | ICD-10-CM | POA: Diagnosis not present

## 2017-04-12 NOTE — Progress Notes (Signed)
Location:   [facility] Room Number: 113/W Place of Service:  SNF [phone]) Provider:  Dionicia Abler, MD  Patient Care Team: Celedonio Savage, MD as PCP - General (Family Medicine) Rothbart, Cristopher Estimable, MD (Cardiology) Lendon Colonel, NP as Nurse Practitioner (Nurse Practitioner)  Extended Emergency Contact Information Primary Emergency Contact: [facility] Address: [address]          [place],  [address] [name] of [place] Phone: [phone] Mobile Phone: [phone] Relation: Daughter  Code Status:  DNR Goals of care: Advanced Directive information Advanced Directives 04/12/2017  Does Patient Have a Medical Advance Directive? Yes  Type of Advance Directive Out of facility DNR (pink MOST or yellow form)  Does patient want to make changes to medical advance directive? No - Patient declined  Copy of [place] in Chart? No - copy requested  Pre-existing out of facility DNR order (yellow form or pink MOST form) -   Chief complaint-acute visit to follow-up right lower extremity erythema    HPI:  Pt is a 76 y.o. female seen today for an acute visit for follow-up of erythema of her right lower extremity.  She was seen a week ago for some increased erythema more so of area above her right ankle.  She was started empirically on doxycycline- although there was some thought this was mainly venous stasis changes.  According to her nurse today doxycycline appeared to help with the erythema-doxycycline last dose was yesterday however nursing has noted increased erythema today of the area.  There also is a small amount of erythema on her other leg although not as significant.  She currently is afebrile other than complaining of some chronic tenderness to the areas she does not really have any acute complaints.    She does have a history of bilateral venous stasis leg edema and continues on Lasix-she also type II  diabetic she is on Novolin 70-30 twice a day in addition to Janumet-these appear to be fairly stable with recent blood sugars in the morning largely in the lower mid 100s some variability later in the day ranging from mid 100s to mid 200s-although I do not see consistent elevations.  At this point will continue to monitor.  Regards your weight this appears stable from last month with a weight of 212 pounds.  Currently she has no acute complaints nursing however somewhat concerned about the erythema      Past Medical History:  Diagnosis Date  . Allergy   . Atrial fibrillation ([facility]) 08/2011   First diagnosed in 08/2011; duration of arrhythmia is uncertain  . Bilateral lower extremity edema   . Chronic diarrhea    diverticulosis  . COPD (chronic obstructive pulmonary disease) ([facility])   . Dementia   . Diabetes mellitus, type 2 (HCC)    Diabetic neuropathy  . Dyspnea on exertion    pedal edema  . Gout   . Headache(784.0)    twice weekly  . Hyperlipidemia   . Hypertension   . Osteopenia    DEXA scan 01/2010  . Palpitations   . Seasonal allergies   . Stress incontinence   . Vertigo    Past Surgical History:  Procedure Laterality Date  . APPENDECTOMY    . BREAST BIOPSY  2002   Right  . CESAREAN SECTION     x 2  . CHOLECYSTECTOMY    . COLONOSCOPY  2007   Negative screening study  . HAMMER TOE  SURGERY     Bilateral hammer toe amputation  . INCISIONAL HERNIA REPAIR    . KNEE ARTHROSCOPY W/ MENISCAL REPAIR  2007   Bilateral  . UMBILICAL HERNIA REPAIR      Allergies  Allergen Reactions  . Codeine Anaphylaxis and Hives  . Morphine And Related Anaphylaxis and Hives  . Penicillins Anaphylaxis  . Ace Inhibitors Cough    Outpatient Encounter Medications as of 04/12/2017  Medication Sig  . acetaminophen (TYLENOL) 325 MG tablet Take 650 mg by mouth every 6 (six) hours as needed.  Marland Kitchen alendronate (FOSAMAX) 70 MG tablet Give 1 tablet by mouth on Sunday. Take with a full glass  of water on an empty stomach.  . carboxymethylcellulose (REFRESH PLUS) 0.5 % SOLN Place 1 drop into both eyes 4 (four) times daily.  . cholecalciferol (VITAMIN D) 1000 UNITS tablet Take 1,000 Units by mouth daily.  . citalopram (CELEXA) 20 MG tablet Take 1 tablet (20 mg total) by mouth daily.  . digoxin (LANOXIN) 0.125 MG tablet Take 1 tablet by mouth once a day (HOLD FOR AP UNDER 60)  . docusate sodium (COLACE) 100 MG capsule Take 200 mg by mouth daily as needed.   . furosemide (LASIX) 20 MG tablet Take 60 mg by mouth.   . guaifenesin (HUMIBID E) 400 MG TABS tablet Take 400 mg by mouth every 6 (six) hours as needed.  . insulin NPH-regular Human (NOVOLIN 70/30) (70-30) 100 UNIT/ML injection Give 30 units subcutaneous once a evening  . insulin NPH-regular Human (NOVOLIN 70/30) (70-30) 100 UNIT/ML injection Give 55 units subcutaneous one a morning  . Insulin Pen Needle (EASY TOUCH PEN NEEDLES) 31G X 8 MM MISC Use twice a day  . losartan (COZAAR) 25 MG tablet Take 25 mg by mouth daily.  . memantine (NAMENDA) 10 MG tablet Take 10 mg by mouth 2 (two) times daily.  . metoprolol (LOPRESSOR) 100 MG tablet Take 1 tablet (100 mg total) by mouth 2 (two) times daily.  . Multiple Vitamin (MULTIVITAMIN WITH MINERALS) TABS Take 1 tablet by mouth every morning.  Marland Kitchen POTASSIUM CHLORIDE ER PO Potassium chloride extended release capsule, give 20 meq by mouth once day  . pravastatin (PRAVACHOL) 40 MG tablet Take 1 tablet (40 mg total) by mouth every evening.  . Probiotic Product (RISA-BID PROBIOTIC PO) Take 1 tablet by mouth twice a day from 04/04/2017-04/14/2017  . sitaGLIPtin-metformin (JANUMET) 50-500 MG tablet Take 1 tablet by mouth 2 (two) times daily with a meal.  . XARELTO 20 MG TABS tablet TAKE (1) TABLET BY MOUTH ONCE DAILY.   No facility-administered encounter medications on file as of 04/12/2017.     Review of Systems   This is limited secondary to dementia but she is not complaining of any fever or  chills or increased tenderness from baseline of her lower extremities-does not complain of any increased shortness of breath or chest pain continues to be in good spirits pleasant smiling somewhat confused but amiably so  Immunization History  Administered Date(s) Administered  . Influenza-Unspecified 02/04/2016, 02/03/2017  . Pneumococcal Conjugate-13 02/03/2017  . Pneumococcal-Unspecified 02/08/2016  . Tdap 01/19/2017   Pertinent  Health Maintenance Due  Topic Date Due  . URINE MICROALBUMIN  05/11/2017  . HEMOGLOBIN A1C  08/16/2017  . FOOT EXAM  01/25/2018  . OPHTHALMOLOGY EXAM  02/22/2018  . INFLUENZA VACCINE  Completed  . DEXA SCAN  Completed  . PNA vac Low Risk Adult  Completed   Fall Risk  01/19/2017 11/06/2015 09/29/2015  08/31/2015 06/30/2015  Falls in the past year? No Yes No No No  Number falls in past yr: - 2 or more - - -  Injury with Fall? - No - - -   Functional Status Survey:    Vitals:   04/12/17 1242  BP: 113/72  Pulse: 81  Resp: 20  Temp: (!) 97.2 F (36.2 C)  TempSrc: Oral  SpO2: 94%  Weight is 212.4 pounds  Physical Exam  In general this is a pleasant elderly female in no distress sitting comfortably in a wheelchair.  Her skin is warm and dry continues to have some erythema above her right medial ankle this actually appears a bit increased from when I last saw although according to nursing this did appear to be improved when she was on the doxycycline Erythema is slightly warm to touch there is no drainage Of The area extends about a third of her lower lright leg  She also has a small amount of erythema above her ankle on the left leg  Oropharynx is clear mucous membranes moist.  Chest is clear to auscultation there is no labored breathing.  Heart is regular rate and rhythm with an occasional irregular beat again she does have chronic venous stasis changes I would say 2+ upper extremities bilaterally which appears relatively unchanged. As Her abdomen  is obese soft nontender with positive bowel sounds.  Musculoskeletal continues to move all extremities x4 at baseline.  Neurologic is grossly intact no lateralizing findings her speech is clear.  Psych she is oriented to self only is pleasant appropriate follow simple verbal commands without difficulty continues to be in a pleasant jocular mood   Labs reviewed: Recent Labs    01/16/17 0645 01/23/17 0420 02/15/17 0731  NA 138 137 138  K 4.1 3.9 4.1  CL 98* 100* 99*  CO2 31 28 31   GLUCOSE 142* 118* 130*  BUN 32* 23* 27*  CREATININE 1.08* 0.87 1.03*  CALCIUM 9.7 8.8* 9.0   Recent Labs    05/12/16 0500 11/14/16 0730  AST 13* 15  ALT 13* 15  ALKPHOS 49 48  BILITOT 0.7 0.7  PROT 6.6 7.2  ALBUMIN 2.9* 3.2*   Recent Labs    11/14/16 0730  01/12/17 0726 01/13/17 0707 01/16/17 0645  WBC 6.6   < > 8.4 8.0 8.5  NEUTROABS 3.6  --   --   --   --   HGB 12.6   < > 12.9 12.6 12.9  HCT 39.4   < > 40.0 38.6 41.5  MCV 92.7   < > 92.4 92.3 93.5  PLT 231   < > 217 201 239   < > = values in this interval not displayed.   Lab Results  Component Value Date   TSH 3.931 01/16/2017   Lab Results  Component Value Date   HGBA1C 7.1 (H) 02/15/2017   Lab Results  Component Value Date   CHOL 143 12/21/2016   HDL 36 (L) 12/21/2016   LDLCALC 92 12/21/2016   TRIG 77 12/21/2016   CHOLHDL 4.0 12/21/2016    Significant Diagnostic Results in last 30 days:  No results found.  Assessment/Plan  #1-continued lower extremity erythema-speaking with her nurse today it was thought that the doxycycline did help but now that has been discontinued appears erythema has reappeared-we will restart the doxycycline 100 mg twice daily for now but this would be close monitoring if erythema persists consider adding another agent.  Also will update a CBC  with differential tomorrow to look for any elevated white count.  Clinically she appears stable afebrile but this will have to be  watched.  2.History of diabetes type 2 as noted above this appears to be relatively stable some higher readings at times later in the day-but this is not consistent  #3-   bilateral leg edema venous stasis-cannot rule out that this may be contributing to the erythema- at this point we will continue to monitor.---Her weights have been stable Dopplers have been negative in the past-she continues on Lasix with potassium supplementation but will update a BMP as well-  [id number]--of note greater than 25 minutes spent assessing patient-discussing her status with nursing staff- reviewing her chart labs-and coordinating a plan of care-of note greater than 50% of time spent coordinating plan of care

## 2017-04-12 NOTE — Progress Notes (Signed)
Location:   [facility] Room Number: 113/W Place of Service:  SNF (31) Provider:  Jefm Bryant, MD  Patient Care Team: Celedonio Savage, MD as PCP - General (Family Medicine) Rothbart, Cristopher Estimable, MD (Cardiology) Lendon Colonel, NP as Nurse Practitioner (Nurse Practitioner)  Extended Emergency Contact Information Primary Emergency Contact: [facility] Address: [address]          [place], [place] [address] [name] of [place] Phone: [phone] Mobile Phone: [phone] Relation: Daughter  Code Status:  DNR Goals of care: Advanced Directive information Advanced Directives 04/12/2017  Does Patient Have a Medical Advance Directive? Yes  Type of Advance Directive Out of facility DNR (pink MOST or yellow form)  Does patient want to make changes to medical advance directive? No - Patient declined  Copy of [place] in Chart? No - copy requested  Pre-existing out of facility DNR order (yellow form or pink MOST form) -     Chief Complaint  Patient presents with  . Acute Visit    Patient c/o Right Leg Redness and Swelling    HPI:  Pt is a 76 y.o. female seen today for an acute visit for    Past Medical History:  Diagnosis Date  . Allergy   . Atrial fibrillation ([place]) 08/2011   First diagnosed in 08/2011; duration of arrhythmia is uncertain  . Bilateral lower extremity edema   . Chronic diarrhea    diverticulosis  . COPD (chronic obstructive pulmonary disease) ([place])   . Dementia   . Diabetes mellitus, type 2 (HCC)    Diabetic neuropathy  . Dyspnea on exertion    pedal edema  . Gout   . Headache(784.0)    twice weekly  . Hyperlipidemia   . Hypertension   . Osteopenia    DEXA scan 01/2010  . Palpitations   . Seasonal allergies   . Stress incontinence   . Vertigo    Past Surgical History:  Procedure Laterality Date  . APPENDECTOMY    . BREAST BIOPSY  2002   Right  . CESAREAN SECTION     x 2    . CHOLECYSTECTOMY    . COLONOSCOPY  2007   Negative screening study  . HAMMER TOE SURGERY     Bilateral hammer toe amputation  . INCISIONAL HERNIA REPAIR    . KNEE ARTHROSCOPY W/ MENISCAL REPAIR  2007   Bilateral  . UMBILICAL HERNIA REPAIR      Allergies  Allergen Reactions  . Codeine Anaphylaxis and Hives  . Morphine And Related Anaphylaxis and Hives  . Penicillins Anaphylaxis  . Ace Inhibitors Cough    Outpatient Encounter Medications as of 04/12/2017  Medication Sig  . acetaminophen (TYLENOL) 325 MG tablet Take 650 mg by mouth every 6 (six) hours as needed.  Marland Kitchen alendronate (FOSAMAX) 70 MG tablet Give 1 tablet by mouth on Sunday. Take with a full glass of water on an empty stomach.  . carboxymethylcellulose (REFRESH PLUS) 0.5 % SOLN Place 1 drop into both eyes 4 (four) times daily.  . cholecalciferol (VITAMIN D) 1000 UNITS tablet Take 1,000 Units by mouth daily.  . citalopram (CELEXA) 20 MG tablet Take 1 tablet (20 mg total) by mouth daily.  . digoxin (LANOXIN) 0.125 MG tablet Take 1 tablet by mouth once a day (HOLD FOR AP UNDER 60)  . docusate sodium (COLACE) 100 MG capsule Take 200 mg by mouth daily as needed.   . furosemide (LASIX) 20  MG tablet Take 60 mg by mouth.   . guaifenesin (HUMIBID E) 400 MG TABS tablet Take 400 mg by mouth every 6 (six) hours as needed.  . insulin NPH-regular Human (NOVOLIN 70/30) (70-30) 100 UNIT/ML injection Give 30 units subcutaneous once a evening  . insulin NPH-regular Human (NOVOLIN 70/30) (70-30) 100 UNIT/ML injection Give 55 units subcutaneous one a morning  . Insulin Pen Needle (EASY TOUCH PEN NEEDLES) 31G X 8 MM MISC Use twice a day  . losartan (COZAAR) 25 MG tablet Take 25 mg by mouth daily.  . memantine (NAMENDA) 10 MG tablet Take 10 mg by mouth 2 (two) times daily.  . metoprolol (LOPRESSOR) 100 MG tablet Take 1 tablet (100 mg total) by mouth 2 (two) times daily.  . Multiple Vitamin (MULTIVITAMIN WITH MINERALS) TABS Take 1 tablet by  mouth every morning.  Marland Kitchen POTASSIUM CHLORIDE ER PO Potassium chloride extended release capsule, give 20 meq by mouth once day  . pravastatin (PRAVACHOL) 40 MG tablet Take 1 tablet (40 mg total) by mouth every evening.  . Probiotic Product (RISA-BID PROBIOTIC PO) Take 1 tablet by mouth twice a day from 04/04/2017-04/14/2017  . sitaGLIPtin-metformin (JANUMET) 50-500 MG tablet Take 1 tablet by mouth 2 (two) times daily with a meal.  . XARELTO 20 MG TABS tablet TAKE (1) TABLET BY MOUTH ONCE DAILY.   No facility-administered encounter medications on file as of 04/12/2017.      Review of Systems  Immunization History  Administered Date(s) Administered  . Influenza-Unspecified 02/04/2016, 02/03/2017  . Pneumococcal Conjugate-13 02/03/2017  . Pneumococcal-Unspecified 02/08/2016  . Tdap 01/19/2017   Pertinent  Health Maintenance Due  Topic Date Due  . URINE MICROALBUMIN  05/11/2017  . HEMOGLOBIN A1C  08/16/2017  . FOOT EXAM  01/25/2018  . OPHTHALMOLOGY EXAM  02/22/2018  . INFLUENZA VACCINE  Completed  . DEXA SCAN  Completed  . PNA vac Low Risk Adult  Completed   Fall Risk  01/19/2017 11/06/2015 09/29/2015 08/31/2015 06/30/2015  Falls in the past year? No Yes No No No  Number falls in past yr: - 2 or more - - -  Injury with Fall? - No - - -   Functional Status Survey:    There were no vitals filed for this visit. There is no height or weight on file to calculate BMI. Physical Exam  Labs reviewed: Recent Labs    01/16/17 0645 01/23/17 0420 02/15/17 0731  NA 138 137 138  K 4.1 3.9 4.1  CL 98* 100* 99*  CO2 31 28 31   GLUCOSE 142* 118* 130*  BUN 32* 23* 27*  CREATININE 1.08* 0.87 1.03*  CALCIUM 9.7 8.8* 9.0   Recent Labs    05/12/16 0500 11/14/16 0730  AST 13* 15  ALT 13* 15  ALKPHOS 49 48  BILITOT 0.7 0.7  PROT 6.6 7.2  ALBUMIN 2.9* 3.2*   Recent Labs    11/14/16 0730  01/12/17 0726 01/13/17 0707 01/16/17 0645  WBC 6.6   < > 8.4 8.0 8.5  NEUTROABS 3.6  --   --   --    --   HGB 12.6   < > 12.9 12.6 12.9  HCT 39.4   < > 40.0 38.6 41.5  MCV 92.7   < > 92.4 92.3 93.5  PLT 231   < > 217 201 239   < > = values in this interval not displayed.   Lab Results  Component Value Date   TSH 3.931 01/16/2017  Lab Results  Component Value Date   HGBA1C 7.1 (H) 02/15/2017   Lab Results  Component Value Date   CHOL 143 12/21/2016   HDL 36 (L) 12/21/2016   LDLCALC 92 12/21/2016   TRIG 77 12/21/2016   CHOLHDL 4.0 12/21/2016    Significant Diagnostic Results in last 30 days:  No results found.  Assessment/Plan There are no diagnoses linked to this encounter.   Family/ staff Communication:   Labs/tests ordered:    This encounter was created in error - please disregard.

## 2017-04-13 ENCOUNTER — Encounter (HOSPITAL_COMMUNITY)
Admission: RE | Admit: 2017-04-13 | Discharge: 2017-04-13 | Disposition: A | Discharge status: Home / Self Care | Payer: Medicare Other | Source: Skilled Nursing Facility | Attending: Internal Medicine | Admitting: Internal Medicine

## 2017-04-13 DIAGNOSIS — M6281 Muscle weakness (generalized): Secondary | ICD-10-CM | POA: Diagnosis not present

## 2017-04-13 DIAGNOSIS — R279 Unspecified lack of coordination: Secondary | ICD-10-CM | POA: Diagnosis not present

## 2017-04-13 DIAGNOSIS — R41841 Cognitive communication deficit: Secondary | ICD-10-CM | POA: Diagnosis not present

## 2017-04-13 DIAGNOSIS — R262 Difficulty in walking, not elsewhere classified: Secondary | ICD-10-CM | POA: Diagnosis not present

## 2017-04-13 DIAGNOSIS — L03119 Cellulitis of unspecified part of limb: Secondary | ICD-10-CM | POA: Insufficient documentation

## 2017-04-13 DIAGNOSIS — M810 Age-related osteoporosis without current pathological fracture: Secondary | ICD-10-CM | POA: Diagnosis not present

## 2017-04-13 DIAGNOSIS — I5042 Chronic combined systolic (congestive) and diastolic (congestive) heart failure: Secondary | ICD-10-CM | POA: Diagnosis not present

## 2017-04-13 DIAGNOSIS — F0391 Unspecified dementia with behavioral disturbance: Secondary | ICD-10-CM | POA: Diagnosis not present

## 2017-04-13 DIAGNOSIS — F3289 Other specified depressive episodes: Secondary | ICD-10-CM | POA: Diagnosis not present

## 2017-04-13 LAB — CBC WITH DIFFERENTIAL/PLATELET
BASOS ABS: 0 10*3/uL (ref 0.0–0.1)
BASOS PCT: 0 %
EOS ABS: 0.2 10*3/uL (ref 0.0–0.7)
EOS PCT: 3 %
HCT: 40.7 % (ref 36.0–46.0)
Hemoglobin: 12.5 g/dL (ref 12.0–15.0)
Lymphocytes Relative: 20 %
Lymphs Abs: 1.6 10*3/uL (ref 0.7–4.0)
MCH: 29.1 pg (ref 26.0–34.0)
MCHC: 30.7 g/dL (ref 30.0–36.0)
MCV: 94.9 fL (ref 78.0–100.0)
MONO ABS: 0.9 10*3/uL (ref 0.1–1.0)
MONOS PCT: 11 %
Neutro Abs: 5.4 10*3/uL (ref 1.7–7.7)
Neutrophils Relative %: 66 %
PLATELETS: 231 10*3/uL (ref 150–400)
RBC: 4.29 MIL/uL (ref 3.87–5.11)
RDW: 14 % (ref 11.5–15.5)
WBC: 8.1 10*3/uL (ref 4.0–10.5)

## 2017-04-13 LAB — BASIC METABOLIC PANEL
ANION GAP: 6 (ref 5–15)
BUN: 27 mg/dL — ABNORMAL HIGH (ref 6–20)
CALCIUM: 9.1 mg/dL (ref 8.9–10.3)
CO2: 28 mmol/L (ref 22–32)
CREATININE: 0.88 mg/dL (ref 0.44–1.00)
Chloride: 101 mmol/L (ref 101–111)
Glucose, Bld: 228 mg/dL — ABNORMAL HIGH (ref 65–99)
Potassium: 4 mmol/L (ref 3.5–5.1)
Sodium: 135 mmol/L (ref 135–145)

## 2017-04-14 DIAGNOSIS — F3289 Other specified depressive episodes: Secondary | ICD-10-CM | POA: Diagnosis not present

## 2017-04-14 DIAGNOSIS — R279 Unspecified lack of coordination: Secondary | ICD-10-CM | POA: Diagnosis not present

## 2017-04-14 DIAGNOSIS — R41841 Cognitive communication deficit: Secondary | ICD-10-CM | POA: Diagnosis not present

## 2017-04-14 DIAGNOSIS — F0391 Unspecified dementia with behavioral disturbance: Secondary | ICD-10-CM | POA: Diagnosis not present

## 2017-04-14 DIAGNOSIS — R262 Difficulty in walking, not elsewhere classified: Secondary | ICD-10-CM | POA: Diagnosis not present

## 2017-04-14 DIAGNOSIS — M810 Age-related osteoporosis without current pathological fracture: Secondary | ICD-10-CM | POA: Diagnosis not present

## 2017-04-17 DIAGNOSIS — F0391 Unspecified dementia with behavioral disturbance: Secondary | ICD-10-CM | POA: Diagnosis not present

## 2017-04-17 DIAGNOSIS — F3289 Other specified depressive episodes: Secondary | ICD-10-CM | POA: Diagnosis not present

## 2017-04-17 DIAGNOSIS — R262 Difficulty in walking, not elsewhere classified: Secondary | ICD-10-CM | POA: Diagnosis not present

## 2017-04-17 DIAGNOSIS — M810 Age-related osteoporosis without current pathological fracture: Secondary | ICD-10-CM | POA: Diagnosis not present

## 2017-04-17 DIAGNOSIS — R279 Unspecified lack of coordination: Secondary | ICD-10-CM | POA: Diagnosis not present

## 2017-04-17 DIAGNOSIS — R41841 Cognitive communication deficit: Secondary | ICD-10-CM | POA: Diagnosis not present

## 2017-04-18 DIAGNOSIS — R41841 Cognitive communication deficit: Secondary | ICD-10-CM | POA: Diagnosis not present

## 2017-04-18 DIAGNOSIS — R262 Difficulty in walking, not elsewhere classified: Secondary | ICD-10-CM | POA: Diagnosis not present

## 2017-04-18 DIAGNOSIS — F3289 Other specified depressive episodes: Secondary | ICD-10-CM | POA: Diagnosis not present

## 2017-04-18 DIAGNOSIS — R279 Unspecified lack of coordination: Secondary | ICD-10-CM | POA: Diagnosis not present

## 2017-04-18 DIAGNOSIS — F0391 Unspecified dementia with behavioral disturbance: Secondary | ICD-10-CM | POA: Diagnosis not present

## 2017-04-18 DIAGNOSIS — M810 Age-related osteoporosis without current pathological fracture: Secondary | ICD-10-CM | POA: Diagnosis not present

## 2017-04-19 DIAGNOSIS — R279 Unspecified lack of coordination: Secondary | ICD-10-CM | POA: Diagnosis not present

## 2017-04-19 DIAGNOSIS — F0391 Unspecified dementia with behavioral disturbance: Secondary | ICD-10-CM | POA: Diagnosis not present

## 2017-04-19 DIAGNOSIS — M810 Age-related osteoporosis without current pathological fracture: Secondary | ICD-10-CM | POA: Diagnosis not present

## 2017-04-19 DIAGNOSIS — R41841 Cognitive communication deficit: Secondary | ICD-10-CM | POA: Diagnosis not present

## 2017-04-19 DIAGNOSIS — R262 Difficulty in walking, not elsewhere classified: Secondary | ICD-10-CM | POA: Diagnosis not present

## 2017-04-19 DIAGNOSIS — F3289 Other specified depressive episodes: Secondary | ICD-10-CM | POA: Diagnosis not present

## 2017-04-20 DIAGNOSIS — F3289 Other specified depressive episodes: Secondary | ICD-10-CM | POA: Diagnosis not present

## 2017-04-20 DIAGNOSIS — R262 Difficulty in walking, not elsewhere classified: Secondary | ICD-10-CM | POA: Diagnosis not present

## 2017-04-20 DIAGNOSIS — F0391 Unspecified dementia with behavioral disturbance: Secondary | ICD-10-CM | POA: Diagnosis not present

## 2017-04-20 DIAGNOSIS — R41841 Cognitive communication deficit: Secondary | ICD-10-CM | POA: Diagnosis not present

## 2017-04-20 DIAGNOSIS — R279 Unspecified lack of coordination: Secondary | ICD-10-CM | POA: Diagnosis not present

## 2017-04-20 DIAGNOSIS — M810 Age-related osteoporosis without current pathological fracture: Secondary | ICD-10-CM | POA: Diagnosis not present

## 2017-04-21 DIAGNOSIS — R41841 Cognitive communication deficit: Secondary | ICD-10-CM | POA: Diagnosis not present

## 2017-04-21 DIAGNOSIS — F3289 Other specified depressive episodes: Secondary | ICD-10-CM | POA: Diagnosis not present

## 2017-04-21 DIAGNOSIS — R262 Difficulty in walking, not elsewhere classified: Secondary | ICD-10-CM | POA: Diagnosis not present

## 2017-04-21 DIAGNOSIS — M810 Age-related osteoporosis without current pathological fracture: Secondary | ICD-10-CM | POA: Diagnosis not present

## 2017-04-21 DIAGNOSIS — F0391 Unspecified dementia with behavioral disturbance: Secondary | ICD-10-CM | POA: Diagnosis not present

## 2017-04-21 DIAGNOSIS — R279 Unspecified lack of coordination: Secondary | ICD-10-CM | POA: Diagnosis not present

## 2017-04-24 DIAGNOSIS — R41841 Cognitive communication deficit: Secondary | ICD-10-CM | POA: Diagnosis not present

## 2017-04-24 DIAGNOSIS — F3289 Other specified depressive episodes: Secondary | ICD-10-CM | POA: Diagnosis not present

## 2017-04-24 DIAGNOSIS — R262 Difficulty in walking, not elsewhere classified: Secondary | ICD-10-CM | POA: Diagnosis not present

## 2017-04-24 DIAGNOSIS — M810 Age-related osteoporosis without current pathological fracture: Secondary | ICD-10-CM | POA: Diagnosis not present

## 2017-04-24 DIAGNOSIS — F0391 Unspecified dementia with behavioral disturbance: Secondary | ICD-10-CM | POA: Diagnosis not present

## 2017-04-24 DIAGNOSIS — R279 Unspecified lack of coordination: Secondary | ICD-10-CM | POA: Diagnosis not present

## 2017-04-26 DIAGNOSIS — F0391 Unspecified dementia with behavioral disturbance: Secondary | ICD-10-CM | POA: Diagnosis not present

## 2017-04-26 DIAGNOSIS — R262 Difficulty in walking, not elsewhere classified: Secondary | ICD-10-CM | POA: Diagnosis not present

## 2017-04-26 DIAGNOSIS — F3289 Other specified depressive episodes: Secondary | ICD-10-CM | POA: Diagnosis not present

## 2017-04-26 DIAGNOSIS — R279 Unspecified lack of coordination: Secondary | ICD-10-CM | POA: Diagnosis not present

## 2017-04-26 DIAGNOSIS — M810 Age-related osteoporosis without current pathological fracture: Secondary | ICD-10-CM | POA: Diagnosis not present

## 2017-04-26 DIAGNOSIS — R41841 Cognitive communication deficit: Secondary | ICD-10-CM | POA: Diagnosis not present

## 2017-04-27 DIAGNOSIS — R41841 Cognitive communication deficit: Secondary | ICD-10-CM | POA: Diagnosis not present

## 2017-04-27 DIAGNOSIS — R279 Unspecified lack of coordination: Secondary | ICD-10-CM | POA: Diagnosis not present

## 2017-04-27 DIAGNOSIS — F3289 Other specified depressive episodes: Secondary | ICD-10-CM | POA: Diagnosis not present

## 2017-04-27 DIAGNOSIS — M810 Age-related osteoporosis without current pathological fracture: Secondary | ICD-10-CM | POA: Diagnosis not present

## 2017-04-27 DIAGNOSIS — R262 Difficulty in walking, not elsewhere classified: Secondary | ICD-10-CM | POA: Diagnosis not present

## 2017-04-27 DIAGNOSIS — F0391 Unspecified dementia with behavioral disturbance: Secondary | ICD-10-CM | POA: Diagnosis not present

## 2017-04-28 DIAGNOSIS — F3289 Other specified depressive episodes: Secondary | ICD-10-CM | POA: Diagnosis not present

## 2017-04-28 DIAGNOSIS — F0391 Unspecified dementia with behavioral disturbance: Secondary | ICD-10-CM | POA: Diagnosis not present

## 2017-04-28 DIAGNOSIS — R279 Unspecified lack of coordination: Secondary | ICD-10-CM | POA: Diagnosis not present

## 2017-04-28 DIAGNOSIS — R262 Difficulty in walking, not elsewhere classified: Secondary | ICD-10-CM | POA: Diagnosis not present

## 2017-04-28 DIAGNOSIS — R41841 Cognitive communication deficit: Secondary | ICD-10-CM | POA: Diagnosis not present

## 2017-04-28 DIAGNOSIS — M810 Age-related osteoporosis without current pathological fracture: Secondary | ICD-10-CM | POA: Diagnosis not present

## 2017-04-29 DIAGNOSIS — F0391 Unspecified dementia with behavioral disturbance: Secondary | ICD-10-CM | POA: Diagnosis not present

## 2017-04-29 DIAGNOSIS — R262 Difficulty in walking, not elsewhere classified: Secondary | ICD-10-CM | POA: Diagnosis not present

## 2017-04-29 DIAGNOSIS — R41841 Cognitive communication deficit: Secondary | ICD-10-CM | POA: Diagnosis not present

## 2017-04-29 DIAGNOSIS — R279 Unspecified lack of coordination: Secondary | ICD-10-CM | POA: Diagnosis not present

## 2017-04-29 DIAGNOSIS — M810 Age-related osteoporosis without current pathological fracture: Secondary | ICD-10-CM | POA: Diagnosis not present

## 2017-04-29 DIAGNOSIS — F3289 Other specified depressive episodes: Secondary | ICD-10-CM | POA: Diagnosis not present

## 2017-05-01 DIAGNOSIS — R262 Difficulty in walking, not elsewhere classified: Secondary | ICD-10-CM | POA: Diagnosis not present

## 2017-05-01 DIAGNOSIS — M810 Age-related osteoporosis without current pathological fracture: Secondary | ICD-10-CM | POA: Diagnosis not present

## 2017-05-01 DIAGNOSIS — F0391 Unspecified dementia with behavioral disturbance: Secondary | ICD-10-CM | POA: Diagnosis not present

## 2017-05-01 DIAGNOSIS — R279 Unspecified lack of coordination: Secondary | ICD-10-CM | POA: Diagnosis not present

## 2017-05-01 DIAGNOSIS — R41841 Cognitive communication deficit: Secondary | ICD-10-CM | POA: Diagnosis not present

## 2017-05-01 DIAGNOSIS — F3289 Other specified depressive episodes: Secondary | ICD-10-CM | POA: Diagnosis not present

## 2017-05-02 DIAGNOSIS — F0390 Unspecified dementia without behavioral disturbance: Secondary | ICD-10-CM | POA: Diagnosis not present

## 2017-05-02 DIAGNOSIS — R262 Difficulty in walking, not elsewhere classified: Secondary | ICD-10-CM | POA: Diagnosis not present

## 2017-05-02 DIAGNOSIS — M810 Age-related osteoporosis without current pathological fracture: Secondary | ICD-10-CM | POA: Diagnosis not present

## 2017-05-02 DIAGNOSIS — F3289 Other specified depressive episodes: Secondary | ICD-10-CM | POA: Diagnosis not present

## 2017-05-02 DIAGNOSIS — R41841 Cognitive communication deficit: Secondary | ICD-10-CM | POA: Diagnosis not present

## 2017-05-02 DIAGNOSIS — R279 Unspecified lack of coordination: Secondary | ICD-10-CM | POA: Diagnosis not present

## 2017-05-03 DIAGNOSIS — R279 Unspecified lack of coordination: Secondary | ICD-10-CM | POA: Diagnosis not present

## 2017-05-03 DIAGNOSIS — F3289 Other specified depressive episodes: Secondary | ICD-10-CM | POA: Diagnosis not present

## 2017-05-03 DIAGNOSIS — R262 Difficulty in walking, not elsewhere classified: Secondary | ICD-10-CM | POA: Diagnosis not present

## 2017-05-03 DIAGNOSIS — M810 Age-related osteoporosis without current pathological fracture: Secondary | ICD-10-CM | POA: Diagnosis not present

## 2017-05-03 DIAGNOSIS — R41841 Cognitive communication deficit: Secondary | ICD-10-CM | POA: Diagnosis not present

## 2017-05-03 DIAGNOSIS — F0390 Unspecified dementia without behavioral disturbance: Secondary | ICD-10-CM | POA: Diagnosis not present

## 2017-05-04 DIAGNOSIS — R279 Unspecified lack of coordination: Secondary | ICD-10-CM | POA: Diagnosis not present

## 2017-05-04 DIAGNOSIS — M810 Age-related osteoporosis without current pathological fracture: Secondary | ICD-10-CM | POA: Diagnosis not present

## 2017-05-04 DIAGNOSIS — B351 Tinea unguium: Secondary | ICD-10-CM | POA: Diagnosis not present

## 2017-05-04 DIAGNOSIS — R41841 Cognitive communication deficit: Secondary | ICD-10-CM | POA: Diagnosis not present

## 2017-05-04 DIAGNOSIS — F3289 Other specified depressive episodes: Secondary | ICD-10-CM | POA: Diagnosis not present

## 2017-05-04 DIAGNOSIS — R262 Difficulty in walking, not elsewhere classified: Secondary | ICD-10-CM | POA: Diagnosis not present

## 2017-05-04 DIAGNOSIS — E1151 Type 2 diabetes mellitus with diabetic peripheral angiopathy without gangrene: Secondary | ICD-10-CM | POA: Diagnosis not present

## 2017-05-04 DIAGNOSIS — F0390 Unspecified dementia without behavioral disturbance: Secondary | ICD-10-CM | POA: Diagnosis not present

## 2017-05-05 DIAGNOSIS — R41841 Cognitive communication deficit: Secondary | ICD-10-CM | POA: Diagnosis not present

## 2017-05-05 DIAGNOSIS — R262 Difficulty in walking, not elsewhere classified: Secondary | ICD-10-CM | POA: Diagnosis not present

## 2017-05-05 DIAGNOSIS — F3289 Other specified depressive episodes: Secondary | ICD-10-CM | POA: Diagnosis not present

## 2017-05-05 DIAGNOSIS — F0390 Unspecified dementia without behavioral disturbance: Secondary | ICD-10-CM | POA: Diagnosis not present

## 2017-05-05 DIAGNOSIS — R279 Unspecified lack of coordination: Secondary | ICD-10-CM | POA: Diagnosis not present

## 2017-05-05 DIAGNOSIS — M810 Age-related osteoporosis without current pathological fracture: Secondary | ICD-10-CM | POA: Diagnosis not present

## 2017-05-08 DIAGNOSIS — R262 Difficulty in walking, not elsewhere classified: Secondary | ICD-10-CM | POA: Diagnosis not present

## 2017-05-08 DIAGNOSIS — F0390 Unspecified dementia without behavioral disturbance: Secondary | ICD-10-CM | POA: Diagnosis not present

## 2017-05-08 DIAGNOSIS — F3289 Other specified depressive episodes: Secondary | ICD-10-CM | POA: Diagnosis not present

## 2017-05-08 DIAGNOSIS — R41841 Cognitive communication deficit: Secondary | ICD-10-CM | POA: Diagnosis not present

## 2017-05-08 DIAGNOSIS — R279 Unspecified lack of coordination: Secondary | ICD-10-CM | POA: Diagnosis not present

## 2017-05-08 DIAGNOSIS — M810 Age-related osteoporosis without current pathological fracture: Secondary | ICD-10-CM | POA: Diagnosis not present

## 2017-05-09 DIAGNOSIS — F3289 Other specified depressive episodes: Secondary | ICD-10-CM | POA: Diagnosis not present

## 2017-05-09 DIAGNOSIS — M810 Age-related osteoporosis without current pathological fracture: Secondary | ICD-10-CM | POA: Diagnosis not present

## 2017-05-09 DIAGNOSIS — R279 Unspecified lack of coordination: Secondary | ICD-10-CM | POA: Diagnosis not present

## 2017-05-09 DIAGNOSIS — R262 Difficulty in walking, not elsewhere classified: Secondary | ICD-10-CM | POA: Diagnosis not present

## 2017-05-09 DIAGNOSIS — F0390 Unspecified dementia without behavioral disturbance: Secondary | ICD-10-CM | POA: Diagnosis not present

## 2017-05-09 DIAGNOSIS — R41841 Cognitive communication deficit: Secondary | ICD-10-CM | POA: Diagnosis not present

## 2017-05-11 ENCOUNTER — Non-Acute Institutional Stay (SKILLED_NURSING_FACILITY): Payer: Medicare Other | Admitting: Internal Medicine

## 2017-05-11 ENCOUNTER — Encounter: Payer: Self-pay | Admitting: Internal Medicine

## 2017-05-11 DIAGNOSIS — I10 Essential (primary) hypertension: Secondary | ICD-10-CM

## 2017-05-11 DIAGNOSIS — E785 Hyperlipidemia, unspecified: Secondary | ICD-10-CM | POA: Diagnosis not present

## 2017-05-11 DIAGNOSIS — E1142 Type 2 diabetes mellitus with diabetic polyneuropathy: Secondary | ICD-10-CM

## 2017-05-11 DIAGNOSIS — M810 Age-related osteoporosis without current pathological fracture: Secondary | ICD-10-CM

## 2017-05-11 DIAGNOSIS — R600 Localized edema: Secondary | ICD-10-CM

## 2017-05-11 DIAGNOSIS — I482 Chronic atrial fibrillation, unspecified: Secondary | ICD-10-CM

## 2017-05-11 NOTE — Progress Notes (Signed)
Location:   [facility] Room Number: 113/W Place of Service:  SNF [phone]) Provider:  Jefm Bryant, MD  Patient Care Team: Celedonio Savage, MD as PCP - General (Family Medicine) Rothbart, Cristopher Estimable, MD (Cardiology) Lendon Colonel, NP as Nurse Practitioner (Nurse Practitioner)  Extended Emergency Contact Information Primary Emergency Contact: [facility] Address: [address]          [place], [place] [address] [name] of [place] Phone: [phone] Mobile Phone: [phone] Relation: Daughter  Code Status:  DNR Goals of care: Advanced Directive information Advanced Directives 05/11/2017  Does Patient Have a Medical Advance Directive? Yes  Type of Advance Directive Out of facility DNR (pink MOST or yellow form)  Does patient want to make changes to medical advance directive? No - Patient declined  Copy of [place] in Chart? No - copy requested  Pre-existing out of facility DNR order (yellow form or pink MOST form) -     Chief Complaint  Patient presents with  . Medical Management of Chronic Issues    Routine Visit, Due Albumin    HPI:  Pt is a 77 y.o. female seen today for medical management of chronic diseases.    Patient has h/o Diabetes mellitus type 2 , atrial fibrillation on chronic anticoagulation, Venous stasis, Dementia with Behavior problems, S/P fall leading to Ocular floor fracture. Cellulitis oF LE.  Patient is stable in the facility.  She was treated for lower extremity cellulitis with doxycycline few weeks ago.  According to the nurses that has resolved.  Patient did not have any new complaints. her weight is mildly up at 212 lbs. She did not have any nursing issues Her blood sugars throughout the day are very stable and staying less than 150.  Past Medical History:  Diagnosis Date  . Allergy   . Atrial fibrillation ([place]) 08/2011   First diagnosed in 08/2011; duration of arrhythmia is  uncertain  . Bilateral lower extremity edema   . Chronic diarrhea    diverticulosis  . COPD (chronic obstructive pulmonary disease) ([place])   . Dementia   . Diabetes mellitus, type 2 (HCC)    Diabetic neuropathy  . Dyspnea on exertion    pedal edema  . Gout   . Headache(784.0)    twice weekly  . Hyperlipidemia   . Hypertension   . Osteopenia    DEXA scan 01/2010  . Palpitations   . Seasonal allergies   . Stress incontinence   . Vertigo    Past Surgical History:  Procedure Laterality Date  . APPENDECTOMY    . BREAST BIOPSY  2002   Right  . CESAREAN SECTION     x 2  . CHOLECYSTECTOMY    . COLONOSCOPY  2007   Negative screening study  . HAMMER TOE SURGERY     Bilateral hammer toe amputation  . INCISIONAL HERNIA REPAIR    . KNEE ARTHROSCOPY W/ MENISCAL REPAIR  2007   Bilateral  . UMBILICAL HERNIA REPAIR      Allergies  Allergen Reactions  . Codeine Anaphylaxis and Hives  . Morphine And Related Anaphylaxis and Hives  . Penicillins Anaphylaxis  . Ace Inhibitors Cough    Outpatient Encounter Medications as of 05/11/2017  Medication Sig  . acetaminophen (TYLENOL) 325 MG tablet Take 650 mg by mouth every 6 (six) hours as needed.  Marland Kitchen alendronate (FOSAMAX) 70 MG tablet Give 1 tablet by mouth on Sunday. Take with a full glass  of water on an empty stomach.  . carboxymethylcellulose (REFRESH PLUS) 0.5 % SOLN Place 1 drop into both eyes 4 (four) times daily.  . cholecalciferol (VITAMIN D) 1000 UNITS tablet Take 1,000 Units by mouth daily.  . citalopram (CELEXA) 20 MG tablet Take 1 tablet (20 mg total) by mouth daily.  . digoxin (LANOXIN) 0.125 MG tablet Take 1 tablet by mouth once a day (HOLD FOR AP UNDER 60)  . docusate sodium (COLACE) 100 MG capsule Take 200 mg by mouth daily as needed.   . furosemide (LASIX) 20 MG tablet Take 60 mg by mouth.   . guaifenesin (HUMIBID E) 400 MG TABS tablet Take 400 mg by mouth every 6 (six) hours as needed.  . insulin NPH-regular Human  (NOVOLIN 70/30) (70-30) 100 UNIT/ML injection Give 30 units subcutaneous once a evening  . insulin NPH-regular Human (NOVOLIN 70/30) (70-30) 100 UNIT/ML injection Give 55 units subcutaneous one a morning  . Insulin Pen Needle (EASY TOUCH PEN NEEDLES) 31G X 8 MM MISC Use twice a day  . losartan (COZAAR) 25 MG tablet Take 25 mg by mouth daily.  . memantine (NAMENDA) 10 MG tablet Take 10 mg by mouth 2 (two) times daily.  . metoprolol (LOPRESSOR) 100 MG tablet Take 1 tablet (100 mg total) by mouth 2 (two) times daily.  . Multiple Vitamin (MULTIVITAMIN WITH MINERALS) TABS Take 1 tablet by mouth every morning.  Marland Kitchen POTASSIUM CHLORIDE ER PO Potassium chloride extended release capsule, give 20 meq by mouth once day  . pravastatin (PRAVACHOL) 40 MG tablet Take 1 tablet (40 mg total) by mouth every evening.  . sitaGLIPtin-metformin (JANUMET) 50-500 MG tablet Take 1 tablet by mouth 2 (two) times daily with a meal.  . XARELTO 20 MG TABS tablet TAKE (1) TABLET BY MOUTH ONCE DAILY.  . [DISCONTINUED] Probiotic Product (RISA-BID PROBIOTIC PO) Take 1 tablet by mouth twice a day from 04/04/2017-04/14/2017   No facility-administered encounter medications on file as of 05/11/2017.      Review of Systems  Unable to perform ROS: Dementia    Immunization History  Administered Date(s) Administered  . Influenza-Unspecified 02/04/2016, 02/03/2017  . Pneumococcal Conjugate-13 02/03/2017  . Pneumococcal-Unspecified 02/08/2016  . Tdap 01/19/2017   Pertinent  Health Maintenance Due  Topic Date Due  . URINE MICROALBUMIN  06/11/2017 (Originally 05/11/2017)  . HEMOGLOBIN A1C  08/16/2017  . FOOT EXAM  01/25/2018  . OPHTHALMOLOGY EXAM  02/22/2018  . INFLUENZA VACCINE  Completed  . DEXA SCAN  Completed  . PNA vac Low Risk Adult  Completed   Fall Risk  01/19/2017 11/06/2015 09/29/2015 08/31/2015 06/30/2015  Falls in the past year? No Yes No No No  Number falls in past yr: - 2 or more - - -  Injury with Fall? - No - - -    Functional Status Survey:    Vitals:   05/11/17 1415  BP: 117/73  Pulse: 80  Resp: 20  Temp: 97.7 F (36.5 C)  TempSrc: Oral  SpO2: 98%  Weight: 212 lb 3.2 oz (96.3 kg)  Height: 5\' 6"  (1.676 m)   Body mass index is 34.25 kg/m. Physical Exam  Constitutional: She appears well-developed and well-nourished.  HENT:  Head: Normocephalic.  Mouth/Throat: Oropharynx is clear and moist.  Eyes: Pupils are equal, round, and reactive to light.  Neck: Neck supple.  Cardiovascular: Normal rate and normal heart sounds. An irregularly irregular rhythm present.  Pulmonary/Chest: Effort normal and breath sounds normal. No respiratory distress. She has no  wheezes. She has no rales.  Abdominal: Soft. Bowel sounds are normal. She exhibits no distension. There is no tenderness. There is no rebound.  Musculoskeletal:  Moderate Chronic edema in Both LE with Compression stockings  Lymphadenopathy:    She has no cervical adenopathy.  Neurological: She is alert.  Does follow commands  Skin: Skin is warm and dry.  Psychiatric: She has a normal mood and affect. Her behavior is normal.    Labs reviewed: Recent Labs    01/23/17 0420 02/15/17 0731 04/13/17 0703  NA 137 138 135  K 3.9 4.1 4.0  CL 100* 99* 101  CO2 28 31 28   GLUCOSE 118* 130* 228*  BUN 23* 27* 27*  CREATININE 0.87 1.03* 0.88  CALCIUM 8.8* 9.0 9.1   Recent Labs    05/12/16 0500 11/14/16 0730  AST 13* 15  ALT 13* 15  ALKPHOS 49 48  BILITOT 0.7 0.7  PROT 6.6 7.2  ALBUMIN 2.9* 3.2*   Recent Labs    11/14/16 0730  01/13/17 0707 01/16/17 0645 04/13/17 0703  WBC 6.6   < > 8.0 8.5 8.1  NEUTROABS 3.6  --   --   --  5.4  HGB 12.6   < > 12.6 12.9 12.5  HCT 39.4   < > 38.6 41.5 40.7  MCV 92.7   < > 92.3 93.5 94.9  PLT 231   < > 201 239 231   < > = values in this interval not displayed.   Lab Results  Component Value Date   TSH 3.931 01/16/2017   Lab Results  Component Value Date   HGBA1C 7.1 (H) 02/15/2017    Lab Results  Component Value Date   CHOL 143 12/21/2016   HDL 36 (L) 12/21/2016   LDLCALC 92 12/21/2016   TRIG 77 12/21/2016   CHOLHDL 4.0 12/21/2016    Significant Diagnostic Results in last 30 days:  No results found.  Assessment/Plan  Essential hypertension Patient has few blood pressures documented less than 100.  Patient Patient is asymptomatic Will decrease her Lopressor to 75 mg twice daily and see if it helps Chronic A. 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

## 2017-05-12 DIAGNOSIS — F3289 Other specified depressive episodes: Secondary | ICD-10-CM | POA: Diagnosis not present

## 2017-05-12 DIAGNOSIS — R279 Unspecified lack of coordination: Secondary | ICD-10-CM | POA: Diagnosis not present

## 2017-05-12 DIAGNOSIS — F0390 Unspecified dementia without behavioral disturbance: Secondary | ICD-10-CM | POA: Diagnosis not present

## 2017-05-12 DIAGNOSIS — M810 Age-related osteoporosis without current pathological fracture: Secondary | ICD-10-CM | POA: Diagnosis not present

## 2017-05-12 DIAGNOSIS — R41841 Cognitive communication deficit: Secondary | ICD-10-CM | POA: Diagnosis not present

## 2017-05-12 DIAGNOSIS — R262 Difficulty in walking, not elsewhere classified: Secondary | ICD-10-CM | POA: Diagnosis not present

## 2017-05-16 ENCOUNTER — Encounter (HOSPITAL_COMMUNITY)
Admission: RE | Admit: 2017-05-16 | Discharge: 2017-05-16 | Disposition: A | Discharge status: Home / Self Care | Payer: Medicare Other | Source: Skilled Nursing Facility | Attending: Internal Medicine | Admitting: Internal Medicine

## 2017-05-16 DIAGNOSIS — F3289 Other specified depressive episodes: Secondary | ICD-10-CM | POA: Diagnosis not present

## 2017-05-16 DIAGNOSIS — R279 Unspecified lack of coordination: Secondary | ICD-10-CM | POA: Diagnosis not present

## 2017-05-16 DIAGNOSIS — F0390 Unspecified dementia without behavioral disturbance: Secondary | ICD-10-CM | POA: Diagnosis not present

## 2017-05-16 DIAGNOSIS — R41841 Cognitive communication deficit: Secondary | ICD-10-CM | POA: Diagnosis not present

## 2017-05-16 DIAGNOSIS — E1140 Type 2 diabetes mellitus with diabetic neuropathy, unspecified: Secondary | ICD-10-CM | POA: Insufficient documentation

## 2017-05-16 DIAGNOSIS — R262 Difficulty in walking, not elsewhere classified: Secondary | ICD-10-CM | POA: Diagnosis not present

## 2017-05-16 DIAGNOSIS — M810 Age-related osteoporosis without current pathological fracture: Secondary | ICD-10-CM | POA: Diagnosis not present

## 2017-05-16 LAB — LIPID PANEL
CHOL/HDL RATIO: 4.1 ratio
CHOLESTEROL: 156 mg/dL (ref 0–200)
HDL: 38 mg/dL — AB (ref >40)
LDL Cholesterol: 104 mg/dL — ABNORMAL HIGH (ref 0–99)
TRIGLYCERIDES: 72 mg/dL (ref <150)
VLDL: 14 mg/dL (ref 0–40)

## 2017-05-16 LAB — HEMOGLOBIN A1C
Hgb A1c MFr Bld: 6.4 % — ABNORMAL HIGH (ref 4.8–5.6)
Mean Plasma Glucose: 136.98 mg/dL

## 2017-05-17 DIAGNOSIS — R279 Unspecified lack of coordination: Secondary | ICD-10-CM | POA: Diagnosis not present

## 2017-05-17 DIAGNOSIS — R262 Difficulty in walking, not elsewhere classified: Secondary | ICD-10-CM | POA: Diagnosis not present

## 2017-05-17 DIAGNOSIS — F0390 Unspecified dementia without behavioral disturbance: Secondary | ICD-10-CM | POA: Diagnosis not present

## 2017-05-17 DIAGNOSIS — F3289 Other specified depressive episodes: Secondary | ICD-10-CM | POA: Diagnosis not present

## 2017-05-17 DIAGNOSIS — M810 Age-related osteoporosis without current pathological fracture: Secondary | ICD-10-CM | POA: Diagnosis not present

## 2017-05-17 DIAGNOSIS — R41841 Cognitive communication deficit: Secondary | ICD-10-CM | POA: Diagnosis not present

## 2017-05-18 DIAGNOSIS — R262 Difficulty in walking, not elsewhere classified: Secondary | ICD-10-CM | POA: Diagnosis not present

## 2017-05-18 DIAGNOSIS — M810 Age-related osteoporosis without current pathological fracture: Secondary | ICD-10-CM | POA: Diagnosis not present

## 2017-05-18 DIAGNOSIS — R279 Unspecified lack of coordination: Secondary | ICD-10-CM | POA: Diagnosis not present

## 2017-05-18 DIAGNOSIS — R41841 Cognitive communication deficit: Secondary | ICD-10-CM | POA: Diagnosis not present

## 2017-05-18 DIAGNOSIS — F3289 Other specified depressive episodes: Secondary | ICD-10-CM | POA: Diagnosis not present

## 2017-05-18 DIAGNOSIS — F0390 Unspecified dementia without behavioral disturbance: Secondary | ICD-10-CM | POA: Diagnosis not present

## 2017-05-19 DIAGNOSIS — F0390 Unspecified dementia without behavioral disturbance: Secondary | ICD-10-CM | POA: Diagnosis not present

## 2017-05-19 DIAGNOSIS — R262 Difficulty in walking, not elsewhere classified: Secondary | ICD-10-CM | POA: Diagnosis not present

## 2017-05-19 DIAGNOSIS — M810 Age-related osteoporosis without current pathological fracture: Secondary | ICD-10-CM | POA: Diagnosis not present

## 2017-05-19 DIAGNOSIS — R41841 Cognitive communication deficit: Secondary | ICD-10-CM | POA: Diagnosis not present

## 2017-05-19 DIAGNOSIS — F3289 Other specified depressive episodes: Secondary | ICD-10-CM | POA: Diagnosis not present

## 2017-05-19 DIAGNOSIS — R279 Unspecified lack of coordination: Secondary | ICD-10-CM | POA: Diagnosis not present

## 2017-05-22 DIAGNOSIS — R262 Difficulty in walking, not elsewhere classified: Secondary | ICD-10-CM | POA: Diagnosis not present

## 2017-05-22 DIAGNOSIS — F0390 Unspecified dementia without behavioral disturbance: Secondary | ICD-10-CM | POA: Diagnosis not present

## 2017-05-22 DIAGNOSIS — M810 Age-related osteoporosis without current pathological fracture: Secondary | ICD-10-CM | POA: Diagnosis not present

## 2017-05-22 DIAGNOSIS — R279 Unspecified lack of coordination: Secondary | ICD-10-CM | POA: Diagnosis not present

## 2017-05-22 DIAGNOSIS — F3289 Other specified depressive episodes: Secondary | ICD-10-CM | POA: Diagnosis not present

## 2017-05-22 DIAGNOSIS — R41841 Cognitive communication deficit: Secondary | ICD-10-CM | POA: Diagnosis not present

## 2017-05-24 DIAGNOSIS — M810 Age-related osteoporosis without current pathological fracture: Secondary | ICD-10-CM | POA: Diagnosis not present

## 2017-05-24 DIAGNOSIS — F0390 Unspecified dementia without behavioral disturbance: Secondary | ICD-10-CM | POA: Diagnosis not present

## 2017-05-24 DIAGNOSIS — R262 Difficulty in walking, not elsewhere classified: Secondary | ICD-10-CM | POA: Diagnosis not present

## 2017-05-24 DIAGNOSIS — F3289 Other specified depressive episodes: Secondary | ICD-10-CM | POA: Diagnosis not present

## 2017-05-24 DIAGNOSIS — R41841 Cognitive communication deficit: Secondary | ICD-10-CM | POA: Diagnosis not present

## 2017-05-24 DIAGNOSIS — R279 Unspecified lack of coordination: Secondary | ICD-10-CM | POA: Diagnosis not present

## 2017-05-25 DIAGNOSIS — F3289 Other specified depressive episodes: Secondary | ICD-10-CM | POA: Diagnosis not present

## 2017-05-25 DIAGNOSIS — M810 Age-related osteoporosis without current pathological fracture: Secondary | ICD-10-CM | POA: Diagnosis not present

## 2017-05-25 DIAGNOSIS — R279 Unspecified lack of coordination: Secondary | ICD-10-CM | POA: Diagnosis not present

## 2017-05-25 DIAGNOSIS — F0390 Unspecified dementia without behavioral disturbance: Secondary | ICD-10-CM | POA: Diagnosis not present

## 2017-05-25 DIAGNOSIS — R262 Difficulty in walking, not elsewhere classified: Secondary | ICD-10-CM | POA: Diagnosis not present

## 2017-05-25 DIAGNOSIS — R41841 Cognitive communication deficit: Secondary | ICD-10-CM | POA: Diagnosis not present

## 2017-05-26 DIAGNOSIS — R41841 Cognitive communication deficit: Secondary | ICD-10-CM | POA: Diagnosis not present

## 2017-05-26 DIAGNOSIS — F3289 Other specified depressive episodes: Secondary | ICD-10-CM | POA: Diagnosis not present

## 2017-05-26 DIAGNOSIS — F0390 Unspecified dementia without behavioral disturbance: Secondary | ICD-10-CM | POA: Diagnosis not present

## 2017-05-26 DIAGNOSIS — R279 Unspecified lack of coordination: Secondary | ICD-10-CM | POA: Diagnosis not present

## 2017-05-26 DIAGNOSIS — M810 Age-related osteoporosis without current pathological fracture: Secondary | ICD-10-CM | POA: Diagnosis not present

## 2017-05-26 DIAGNOSIS — R262 Difficulty in walking, not elsewhere classified: Secondary | ICD-10-CM | POA: Diagnosis not present

## 2017-05-29 DIAGNOSIS — F3289 Other specified depressive episodes: Secondary | ICD-10-CM | POA: Diagnosis not present

## 2017-05-29 DIAGNOSIS — F0390 Unspecified dementia without behavioral disturbance: Secondary | ICD-10-CM | POA: Diagnosis not present

## 2017-05-29 DIAGNOSIS — R262 Difficulty in walking, not elsewhere classified: Secondary | ICD-10-CM | POA: Diagnosis not present

## 2017-05-29 DIAGNOSIS — R41841 Cognitive communication deficit: Secondary | ICD-10-CM | POA: Diagnosis not present

## 2017-05-29 DIAGNOSIS — M810 Age-related osteoporosis without current pathological fracture: Secondary | ICD-10-CM | POA: Diagnosis not present

## 2017-05-29 DIAGNOSIS — R279 Unspecified lack of coordination: Secondary | ICD-10-CM | POA: Diagnosis not present

## 2017-06-01 DIAGNOSIS — R41841 Cognitive communication deficit: Secondary | ICD-10-CM | POA: Diagnosis not present

## 2017-06-01 DIAGNOSIS — R262 Difficulty in walking, not elsewhere classified: Secondary | ICD-10-CM | POA: Diagnosis not present

## 2017-06-01 DIAGNOSIS — M810 Age-related osteoporosis without current pathological fracture: Secondary | ICD-10-CM | POA: Diagnosis not present

## 2017-06-01 DIAGNOSIS — F3289 Other specified depressive episodes: Secondary | ICD-10-CM | POA: Diagnosis not present

## 2017-06-01 DIAGNOSIS — F0390 Unspecified dementia without behavioral disturbance: Secondary | ICD-10-CM | POA: Diagnosis not present

## 2017-06-01 DIAGNOSIS — R279 Unspecified lack of coordination: Secondary | ICD-10-CM | POA: Diagnosis not present

## 2017-06-02 DIAGNOSIS — R41841 Cognitive communication deficit: Secondary | ICD-10-CM | POA: Diagnosis not present

## 2017-06-02 DIAGNOSIS — M810 Age-related osteoporosis without current pathological fracture: Secondary | ICD-10-CM | POA: Diagnosis not present

## 2017-06-02 DIAGNOSIS — F0390 Unspecified dementia without behavioral disturbance: Secondary | ICD-10-CM | POA: Diagnosis not present

## 2017-06-02 DIAGNOSIS — F3289 Other specified depressive episodes: Secondary | ICD-10-CM | POA: Diagnosis not present

## 2017-06-05 ENCOUNTER — Non-Acute Institutional Stay (SKILLED_NURSING_FACILITY): Payer: Medicare Other | Admitting: Internal Medicine

## 2017-06-05 ENCOUNTER — Encounter: Payer: Self-pay | Admitting: Internal Medicine

## 2017-06-05 DIAGNOSIS — Z794 Long term (current) use of insulin: Secondary | ICD-10-CM

## 2017-06-05 DIAGNOSIS — I482 Chronic atrial fibrillation, unspecified: Secondary | ICD-10-CM

## 2017-06-05 DIAGNOSIS — F0391 Unspecified dementia with behavioral disturbance: Secondary | ICD-10-CM

## 2017-06-05 DIAGNOSIS — M810 Age-related osteoporosis without current pathological fracture: Secondary | ICD-10-CM

## 2017-06-05 DIAGNOSIS — E1140 Type 2 diabetes mellitus with diabetic neuropathy, unspecified: Secondary | ICD-10-CM | POA: Diagnosis not present

## 2017-06-05 DIAGNOSIS — I5032 Chronic diastolic (congestive) heart failure: Secondary | ICD-10-CM

## 2017-06-05 DIAGNOSIS — I10 Essential (primary) hypertension: Secondary | ICD-10-CM | POA: Diagnosis not present

## 2017-06-05 NOTE — Progress Notes (Signed)
Location:   [facility] Room Number: 113/W Place of Service:  SNF [phone]) Provider:  Dionicia Abler, MD  Patient Care Team: Celedonio Savage, MD as PCP - General (Family Medicine) Rothbart, Cristopher Estimable, MD (Cardiology) Lendon Colonel, NP as Nurse Practitioner (Nurse Practitioner)  Extended Emergency Contact Information Primary Emergency Contact: [facility] Address: [address]          [place], [place] [address] [name] of [place] Phone: [phone] Mobile Phone: [phone] Relation: Daughter  Code Status:  DNR Goals of care: Advanced Directive information Advanced Directives 06/05/2017  Does Patient Have a Medical Advance Directive? Yes  Type of Advance Directive Out of facility DNR (pink MOST or yellow form)  Does patient want to make changes to medical advance directive? No - Patient declined  Copy of [place] in Chart? No - copy requested  Pre-existing out of facility DNR order (yellow form or pink MOST form) -    Chief complaint-routine visit for medical management of chronic medical conditions including dementia- type 2 diabetes-atrial fibrillation-history of diastolic CHF with venous stasis changes- hypertension- osteoporosis-hyperlipidemia-vitamin D deficiency   HPI:  Pt is a 77 y.o. female seen today for medical management of chronic diseases.  As noted above.  Despite her multiple diagnosis she appears to be doing quite well. She was treated for lower extremity cellulitis back in December and appears to have responded well to a course of doxycycline there is been no reoccurrence to my knowledge.  She is a type II diabetic she is on Novolin 70/30 twice a day blood sugars in the morning appear to be largely in the lower to mid 100s later in the day this also appears to be the case with minimal spikes above 200 or below 100-hemoglobin A1c was 6.4 on lab on May 16, 2017 which actually shows  improvement.  Regards atrial fibrillation this is stable she is on Lopressor for rate control as well as digoxin and continues on Xarelto for anticoagulation.  She does have a history of diastolic CHF her weight appears to be stable she had some mild weight gain recently but over the past month this appears to have leveled out she is on Lasix with potassium will update a BMP to keep an eye on her renal function which was stable back in December.  In regards to hypertension her blood pressure was recently decreased because of low systolic concern she is also on Cozaar I got a manual blood pressure today of 108/68 I see previous readings 113/67 at this point will monitor.  In regards to dementia with behaviors this is been quite stable for some time she is on Namenda that she is on Celexa for coexistent depression.  Currently she is sitting in her wheelchair comfortably has no complaints continues to be bright alert laughing gregarious which is her baseline she appears to have adapted very well to this facility     Past Medical History:  Diagnosis Date  . Allergy   . Atrial fibrillation ([place]) 08/2011   First diagnosed in 08/2011; duration of arrhythmia is uncertain  . Bilateral lower extremity edema   . Chronic diarrhea    diverticulosis  . COPD (chronic obstructive pulmonary disease) ([place])   . Dementia   . Diabetes mellitus, type 2 (HCC)    Diabetic neuropathy  . Dyspnea on exertion    pedal edema  . Gout   . Headache(784.0)    twice weekly  .  Hyperlipidemia   . Hypertension   . Osteopenia    DEXA scan 01/2010  . Palpitations   . Seasonal allergies   . Stress incontinence   . Vertigo    Past Surgical History:  Procedure Laterality Date  . APPENDECTOMY    . BREAST BIOPSY  2002   Right  . CESAREAN SECTION     x 2  . CHOLECYSTECTOMY    . COLONOSCOPY  2007   Negative screening study  . HAMMER TOE SURGERY     Bilateral hammer toe amputation  . INCISIONAL HERNIA REPAIR     . KNEE ARTHROSCOPY W/ MENISCAL REPAIR  2007   Bilateral  . UMBILICAL HERNIA REPAIR      Allergies  Allergen Reactions  . Codeine Anaphylaxis and Hives  . Morphine And Related Anaphylaxis and Hives  . Penicillins Anaphylaxis  . Ace Inhibitors Cough    Outpatient Encounter Medications as of 06/05/2017  Medication Sig  . acetaminophen (TYLENOL) 325 MG tablet Take 650 mg by mouth every 6 (six) hours as needed.  Marland Kitchen alendronate (FOSAMAX) 70 MG tablet Give 1 tablet by mouth on Sunday. Take with a full glass of water on an empty stomach.  . carboxymethylcellulose (REFRESH PLUS) 0.5 % SOLN Place 1 drop into both eyes 4 (four) times daily.  . cholecalciferol (VITAMIN D) 1000 UNITS tablet Take 1,000 Units by mouth daily.  . citalopram (CELEXA) 20 MG tablet Take 1 tablet (20 mg total) by mouth daily.  . digoxin (LANOXIN) 0.125 MG tablet Take 1 tablet by mouth once a day (HOLD FOR AP UNDER 60)  . docusate sodium (COLACE) 100 MG capsule Take 200 mg by mouth daily as needed.   . furosemide (LASIX) 20 MG tablet Take 60 mg by mouth.   . insulin NPH-regular Human (NOVOLIN 70/30) (70-30) 100 UNIT/ML injection Give 30 units subcutaneous once a evening  . insulin NPH-regular Human (NOVOLIN 70/30) (70-30) 100 UNIT/ML injection Give 55 units subcutaneous one a morning  . Insulin Pen Needle (EASY TOUCH PEN NEEDLES) 31G X 8 MM MISC Use twice a day  . losartan (COZAAR) 25 MG tablet Take 25 mg by mouth daily.  . memantine (NAMENDA) 10 MG tablet Take 10 mg by mouth 2 (two) times daily.  . metoprolol tartrate (LOPRESSOR) 25 MG tablet Take 75 mg by mouth 2 (two) times daily.  . Multiple Vitamin (MULTIVITAMIN WITH MINERALS) TABS Take 1 tablet by mouth every morning.  Marland Kitchen POTASSIUM CHLORIDE ER PO Potassium chloride extended release capsule, give 20 meq by mouth once day  . pravastatin (PRAVACHOL) 40 MG tablet Take 1 tablet (40 mg total) by mouth every evening.  . sitaGLIPtin-metformin (JANUMET) 50-500 MG tablet Take 1  tablet by mouth 2 (two) times daily with a meal.  . XARELTO 20 MG TABS tablet TAKE (1) TABLET BY MOUTH ONCE DAILY.  . [DISCONTINUED] guaifenesin (HUMIBID E) 400 MG TABS tablet Take 400 mg by mouth every 6 (six) hours as needed.  . [DISCONTINUED] K-DUR 20 MEQ tablet TAKE 2 TABLETS BY MOUTH ONCE DAILY.  . [DISCONTINUED] metoprolol (LOPRESSOR) 100 MG tablet Take 1 tablet (100 mg total) by mouth 2 (two) times daily.   No facility-administered encounter medications on file as of 06/05/2017.      Review of Systems   This is quite limited secondary to dementia but she is not complaining of any pain or shortness of breath says she eats "too well" denies any abdominal pain nursing does not report any issues.  Immunization History  Administered Date(s) Administered  . Influenza-Unspecified 02/04/2016, 02/03/2017  . Pneumococcal Conjugate-13 02/03/2017  . Pneumococcal-Unspecified 02/08/2016  . Tdap 01/19/2017   Pertinent  Health Maintenance Due  Topic Date Due  . URINE MICROALBUMIN  06/11/2017 (Originally 05/11/2017)  . HEMOGLOBIN A1C  11/13/2017  . FOOT EXAM  01/25/2018  . OPHTHALMOLOGY EXAM  02/22/2018  . INFLUENZA VACCINE  Completed  . DEXA SCAN  Completed  . PNA vac Low Risk Adult  Completed   Fall Risk  01/19/2017 11/06/2015 09/29/2015 08/31/2015 06/30/2015  Falls in the past year? No Yes No No No  Number falls in past yr: - 2 or more - - -  Injury with Fall? - No - - -   Functional Status Survey:    Vitals:   06/05/17 1307  BP: 110/70  Pulse: 83  Resp: 17  Temp: (!) 96.4 F (35.8 C)  TempSrc: Oral  SpO2: 97%  Weight: 213 lb (96.6 kg)  Height: 5\' 6"  (1.676 m)  Weight is 213 pounds Body mass index is 34.38 kg/m. Physical Exam   In general this is a very pleasant somewhat obese female in no distress sitting comfortably in her wheelchair.  Her skin is warm and dry.  I do not see any evidence of cellulitis of her lower leg bilaterally she does have some venous stasis  changes.  Eyes pupils appear reactive to light sclera conjunctive are clear visual acuity appears grossly intact.  Oropharynx clear mucous membranes moist.  Chest is clear to auscultation there is no labored breathing.  Heart regular irregular rate and rhythm without murmur gallop or rub she does have chronic venous stasis changes a bit more edema on the right leg versus left w edema is cool nontender nonerythematous  Her abdomen is obese soft nontender with positive bowel sounds.  Musculoskeletal is able to move all extremities x4 is able to transfer by herself strength appears to be intact all 4 extremities and she does have moderate edema lower extremities with more edema on the right versus the left.  Neurologic she is alert could not really appreciate lateralizing findings her speech is clear.  Psych she is oriented to self follow simple verbal commands is able to carry on a short conversation.  1.-  Labs reviewed: Recent Labs    01/23/17 0420 02/15/17 0731 04/13/17 0703  NA 137 138 135  K 3.9 4.1 4.0  CL 100* 99* 101  CO2 28 31 28   GLUCOSE 118* 130* 228*  BUN 23* 27* 27*  CREATININE 0.87 1.03* 0.88  CALCIUM 8.8* 9.0 9.1   Recent Labs    11/14/16 0730  AST 15  ALT 15  ALKPHOS 48  BILITOT 0.7  PROT 7.2  ALBUMIN 3.2*   Recent Labs    11/14/16 0730  01/13/17 0707 01/16/17 0645 04/13/17 0703  WBC 6.6   < > 8.0 8.5 8.1  NEUTROABS 3.6  --   --   --  5.4  HGB 12.6   < > 12.6 12.9 12.5  HCT 39.4   < > 38.6 41.5 40.7  MCV 92.7   < > 92.3 93.5 94.9  PLT 231   < > 201 239 231   < > = values in this interval not displayed.   Lab Results  Component Value Date   TSH 3.931 01/16/2017   Lab Results  Component Value Date   HGBA1C 6.4 (H) 05/16/2017   Lab Results  Component Value Date   CHOL 156 05/16/2017  HDL 38 (L) 05/16/2017   LDLCALC 104 (H) 05/16/2017   TRIG 72 05/16/2017   CHOLHDL 4.1 05/16/2017    Significant Diagnostic Results in last 30 days:   No results found.  Assessment/Plan  History of dementia with behaviors this has been quite stable for a considerable amount of time she is doing very well with supportive care she continues on Namenda-also on Celexa for coexistent depression.  2.  History of diabetes type 2 this appears well controlled with hemoglobin A1c of 6.4 blood sugars largely in the lower to mid 100s she continues on Novolin 7030 twice a day as well as Janumet twice daily.  3.  History of atrial fibrillation this appears rate controlled on Lopressor and digoxin digoxin level was within acceptable range in October-:.  She is on Xarelto for anticoagulation.  4.  History of diastolic CHF-weight appears to be relatively stable she is on Lasix with potassium at this point--will update a BMP to ensure stability of electrolytes and renal function-she is not complaining of any shortness of breath chest pain increased cough or increased edema from baseline.  5.  History of hypertension this appears stable Lopressor again was recently decreased continues on Cozaar manual blood pressure today was 108/68 previous reading 113/67 will monitor she does not exhibit signs of hypotension is increased weakness dizziness or syncope.  6.  History of vitamin D deficiency level was normal at 33.2 on lab done in September.  7.  History of osteoporosis this is been stable on Fosamax.   #8 history of hyperlipidemia-she is on a statin and LDL was slightly up at 104 on lab done in January  had previously been in the mid 90s--will increase Pravachol to 60 mg a day and recheck a lipid panel in a couple months he does have some venous stasis edema-venous Dopplers back in November 2018   #9 history of lower extremity cellulitis this appears to be largely resolved exam was quite benign-she does continue with chronic venous stasis edema a bit more on the right versus left-venous Dopplers were done in November 2018 and did not show any DVT.  10.   Distant history of fall with ocular floor fracture she is appears to have made a fairly good recovery from this.  Again will update a metabolic panel keep an eye on her renal function since she is on Lasix also will update a CBC for updated values-   [id number]-of note greater than 40 minutes spent assessing patient-reviewing her labs- and her chart and coordinating and formulating a plan of care for numerous diagnoses- of note greater than 50% of time spent coordinating plan of care

## 2017-06-06 ENCOUNTER — Encounter (HOSPITAL_COMMUNITY)
Admission: RE | Admit: 2017-06-06 | Discharge: 2017-06-06 | Disposition: A | Discharge status: Home / Self Care | Payer: Medicare Other | Source: Skilled Nursing Facility | Attending: Internal Medicine | Admitting: Internal Medicine

## 2017-06-06 DIAGNOSIS — L03119 Cellulitis of unspecified part of limb: Secondary | ICD-10-CM | POA: Insufficient documentation

## 2017-06-06 DIAGNOSIS — I5042 Chronic combined systolic (congestive) and diastolic (congestive) heart failure: Secondary | ICD-10-CM | POA: Diagnosis not present

## 2017-06-06 DIAGNOSIS — R41841 Cognitive communication deficit: Secondary | ICD-10-CM | POA: Insufficient documentation

## 2017-06-06 DIAGNOSIS — F0390 Unspecified dementia without behavioral disturbance: Secondary | ICD-10-CM | POA: Insufficient documentation

## 2017-06-06 DIAGNOSIS — F3289 Other specified depressive episodes: Secondary | ICD-10-CM | POA: Diagnosis not present

## 2017-06-06 DIAGNOSIS — M810 Age-related osteoporosis without current pathological fracture: Secondary | ICD-10-CM | POA: Diagnosis not present

## 2017-06-06 LAB — CBC WITH DIFFERENTIAL/PLATELET
BASOS ABS: 0 10*3/uL (ref 0.0–0.1)
Basophils Relative: 0 %
Eosinophils Absolute: 0.4 10*3/uL (ref 0.0–0.7)
Eosinophils Relative: 4 %
HEMATOCRIT: 38.8 % (ref 36.0–46.0)
Hemoglobin: 12 g/dL (ref 12.0–15.0)
LYMPHS PCT: 20 %
Lymphs Abs: 1.9 10*3/uL (ref 0.7–4.0)
MCH: 28.7 pg (ref 26.0–34.0)
MCHC: 30.9 g/dL (ref 30.0–36.0)
MCV: 92.8 fL (ref 78.0–100.0)
Monocytes Absolute: 0.8 10*3/uL (ref 0.1–1.0)
Monocytes Relative: 8 %
NEUTROS ABS: 6.2 10*3/uL (ref 1.7–7.7)
Neutrophils Relative %: 68 %
Platelets: 233 10*3/uL (ref 150–400)
RBC: 4.18 MIL/uL (ref 3.87–5.11)
RDW: 14.1 % (ref 11.5–15.5)
WBC: 9.3 10*3/uL (ref 4.0–10.5)

## 2017-06-06 LAB — BASIC METABOLIC PANEL
ANION GAP: 10 (ref 5–15)
BUN: 26 mg/dL — ABNORMAL HIGH (ref 6–20)
CHLORIDE: 100 mmol/L — AB (ref 101–111)
CO2: 28 mmol/L (ref 22–32)
Calcium: 9 mg/dL (ref 8.9–10.3)
Creatinine, Ser: 1.04 mg/dL — ABNORMAL HIGH (ref 0.44–1.00)
GFR calc Af Amer: 59 mL/min — ABNORMAL LOW (ref >60)
GFR calc non Af Amer: 50 mL/min — ABNORMAL LOW (ref >60)
GLUCOSE: 127 mg/dL — AB (ref 65–99)
POTASSIUM: 4.3 mmol/L (ref 3.5–5.1)
Sodium: 138 mmol/L (ref 135–145)

## 2017-06-08 DIAGNOSIS — F0390 Unspecified dementia without behavioral disturbance: Secondary | ICD-10-CM | POA: Diagnosis not present

## 2017-06-08 DIAGNOSIS — M810 Age-related osteoporosis without current pathological fracture: Secondary | ICD-10-CM | POA: Diagnosis not present

## 2017-06-08 DIAGNOSIS — R41841 Cognitive communication deficit: Secondary | ICD-10-CM | POA: Diagnosis not present

## 2017-06-08 DIAGNOSIS — F3289 Other specified depressive episodes: Secondary | ICD-10-CM | POA: Diagnosis not present

## 2017-06-09 DIAGNOSIS — M810 Age-related osteoporosis without current pathological fracture: Secondary | ICD-10-CM | POA: Diagnosis not present

## 2017-06-09 DIAGNOSIS — F0390 Unspecified dementia without behavioral disturbance: Secondary | ICD-10-CM | POA: Diagnosis not present

## 2017-06-09 DIAGNOSIS — F3289 Other specified depressive episodes: Secondary | ICD-10-CM | POA: Diagnosis not present

## 2017-06-09 DIAGNOSIS — R41841 Cognitive communication deficit: Secondary | ICD-10-CM | POA: Diagnosis not present

## 2017-06-13 DIAGNOSIS — M810 Age-related osteoporosis without current pathological fracture: Secondary | ICD-10-CM | POA: Diagnosis not present

## 2017-06-13 DIAGNOSIS — F0390 Unspecified dementia without behavioral disturbance: Secondary | ICD-10-CM | POA: Diagnosis not present

## 2017-06-13 DIAGNOSIS — F3289 Other specified depressive episodes: Secondary | ICD-10-CM | POA: Diagnosis not present

## 2017-06-13 DIAGNOSIS — R41841 Cognitive communication deficit: Secondary | ICD-10-CM | POA: Diagnosis not present

## 2017-06-15 DIAGNOSIS — R41841 Cognitive communication deficit: Secondary | ICD-10-CM | POA: Diagnosis not present

## 2017-06-15 DIAGNOSIS — M810 Age-related osteoporosis without current pathological fracture: Secondary | ICD-10-CM | POA: Diagnosis not present

## 2017-06-15 DIAGNOSIS — F0390 Unspecified dementia without behavioral disturbance: Secondary | ICD-10-CM | POA: Diagnosis not present

## 2017-06-15 DIAGNOSIS — F3289 Other specified depressive episodes: Secondary | ICD-10-CM | POA: Diagnosis not present

## 2017-06-16 DIAGNOSIS — M810 Age-related osteoporosis without current pathological fracture: Secondary | ICD-10-CM | POA: Diagnosis not present

## 2017-06-16 DIAGNOSIS — R41841 Cognitive communication deficit: Secondary | ICD-10-CM | POA: Diagnosis not present

## 2017-06-16 DIAGNOSIS — F3289 Other specified depressive episodes: Secondary | ICD-10-CM | POA: Diagnosis not present

## 2017-06-16 DIAGNOSIS — F0390 Unspecified dementia without behavioral disturbance: Secondary | ICD-10-CM | POA: Diagnosis not present

## 2017-06-20 DIAGNOSIS — F0390 Unspecified dementia without behavioral disturbance: Secondary | ICD-10-CM | POA: Diagnosis not present

## 2017-06-20 DIAGNOSIS — F3289 Other specified depressive episodes: Secondary | ICD-10-CM | POA: Diagnosis not present

## 2017-06-20 DIAGNOSIS — R41841 Cognitive communication deficit: Secondary | ICD-10-CM | POA: Diagnosis not present

## 2017-06-20 DIAGNOSIS — M810 Age-related osteoporosis without current pathological fracture: Secondary | ICD-10-CM | POA: Diagnosis not present

## 2017-06-22 DIAGNOSIS — R41841 Cognitive communication deficit: Secondary | ICD-10-CM | POA: Diagnosis not present

## 2017-06-22 DIAGNOSIS — M810 Age-related osteoporosis without current pathological fracture: Secondary | ICD-10-CM | POA: Diagnosis not present

## 2017-06-22 DIAGNOSIS — F3289 Other specified depressive episodes: Secondary | ICD-10-CM | POA: Diagnosis not present

## 2017-06-22 DIAGNOSIS — F0390 Unspecified dementia without behavioral disturbance: Secondary | ICD-10-CM | POA: Diagnosis not present

## 2017-06-23 DIAGNOSIS — F0390 Unspecified dementia without behavioral disturbance: Secondary | ICD-10-CM | POA: Diagnosis not present

## 2017-06-23 DIAGNOSIS — R41841 Cognitive communication deficit: Secondary | ICD-10-CM | POA: Diagnosis not present

## 2017-06-23 DIAGNOSIS — M810 Age-related osteoporosis without current pathological fracture: Secondary | ICD-10-CM | POA: Diagnosis not present

## 2017-06-23 DIAGNOSIS — F3289 Other specified depressive episodes: Secondary | ICD-10-CM | POA: Diagnosis not present

## 2017-06-26 DIAGNOSIS — F3289 Other specified depressive episodes: Secondary | ICD-10-CM | POA: Diagnosis not present

## 2017-06-26 DIAGNOSIS — R41841 Cognitive communication deficit: Secondary | ICD-10-CM | POA: Diagnosis not present

## 2017-06-26 DIAGNOSIS — M810 Age-related osteoporosis without current pathological fracture: Secondary | ICD-10-CM | POA: Diagnosis not present

## 2017-06-26 DIAGNOSIS — F0390 Unspecified dementia without behavioral disturbance: Secondary | ICD-10-CM | POA: Diagnosis not present

## 2017-06-27 ENCOUNTER — Encounter: Payer: Self-pay | Admitting: Internal Medicine

## 2017-06-27 ENCOUNTER — Other Ambulatory Visit (HOSPITAL_COMMUNITY)
Admission: RE | Admit: 2017-06-27 | Discharge: 2017-06-27 | Disposition: A | Discharge status: Home / Self Care | Payer: Medicare Other | Source: Other Acute Inpatient Hospital | Attending: Internal Medicine | Admitting: Internal Medicine

## 2017-06-27 ENCOUNTER — Non-Acute Institutional Stay (SKILLED_NURSING_FACILITY): Payer: Medicare Other | Admitting: Internal Medicine

## 2017-06-27 DIAGNOSIS — J111 Influenza due to unidentified influenza virus with other respiratory manifestations: Secondary | ICD-10-CM | POA: Diagnosis not present

## 2017-06-27 DIAGNOSIS — I5042 Chronic combined systolic (congestive) and diastolic (congestive) heart failure: Secondary | ICD-10-CM | POA: Diagnosis not present

## 2017-06-27 DIAGNOSIS — R05 Cough: Secondary | ICD-10-CM

## 2017-06-27 DIAGNOSIS — J101 Influenza due to other identified influenza virus with other respiratory manifestations: Secondary | ICD-10-CM | POA: Diagnosis not present

## 2017-06-27 DIAGNOSIS — R059 Cough, unspecified: Secondary | ICD-10-CM

## 2017-06-27 LAB — BASIC METABOLIC PANEL
Anion gap: 12 (ref 5–15)
BUN: 24 mg/dL — ABNORMAL HIGH (ref 6–20)
CALCIUM: 9.2 mg/dL (ref 8.9–10.3)
CO2: 23 mmol/L (ref 22–32)
Chloride: 100 mmol/L — ABNORMAL LOW (ref 101–111)
Creatinine, Ser: 1.1 mg/dL — ABNORMAL HIGH (ref 0.44–1.00)
GFR calc Af Amer: 55 mL/min — ABNORMAL LOW (ref >60)
GFR, EST NON AFRICAN AMERICAN: 47 mL/min — AB (ref >60)
GLUCOSE: 205 mg/dL — AB (ref 65–99)
Potassium: 4.3 mmol/L (ref 3.5–5.1)
Sodium: 135 mmol/L (ref 135–145)

## 2017-06-27 LAB — INFLUENZA PANEL BY PCR (TYPE A & B)
INFLAPCR: POSITIVE — AB
Influenza B By PCR: NEGATIVE

## 2017-06-27 NOTE — Progress Notes (Signed)
Location:   [facility] Room Number: 113/W Place of Service:  SNF [phone]) Provider:  Jefm Bryant, MD  Patient Care Team: Celedonio Savage, MD as PCP - General (Family Medicine) Rothbart, Cristopher Estimable, MD (Cardiology) Lendon Colonel, NP as Nurse Practitioner (Nurse Practitioner)  Extended Emergency Contact Information Primary Emergency Contact: [facility] Address: [address]          [place], [place] [address] [name] of [place] Phone: [phone] Mobile Phone: [phone] Relation: Daughter  Code Status:  DNR Goals of care: Advanced Directive information Advanced Directives 06/27/2017  Does Patient Have a Medical Advance Directive? Yes  Type of Advance Directive Out of facility DNR (pink MOST or yellow form)  Does patient want to make changes to medical advance directive? No - Patient declined  Copy of [place] in Chart? No - copy requested  Pre-existing out of facility DNR order (yellow form or pink MOST form) -     Chief Complaint  Patient presents with  . Acute Visit    Patients c/o Cough    HPI:  Pt is a 77 y.o. female seen today for an acute visit for    Past Medical History:  Diagnosis Date  . Allergy   . Atrial fibrillation ([place]) 08/2011   First diagnosed in 08/2011; duration of arrhythmia is uncertain  . Bilateral lower extremity edema   . Chronic diarrhea    diverticulosis  . COPD (chronic obstructive pulmonary disease) ([place])   . Dementia   . Diabetes mellitus, type 2 (HCC)    Diabetic neuropathy  . Dyspnea on exertion    pedal edema  . Gout   . Headache(784.0)    twice weekly  . Hyperlipidemia   . Hypertension   . Osteopenia    DEXA scan 01/2010  . Palpitations   . Seasonal allergies   . Stress incontinence   . Vertigo    Past Surgical History:  Procedure Laterality Date  . APPENDECTOMY    . BREAST BIOPSY  2002   Right  . CESAREAN SECTION     x 2  . CHOLECYSTECTOMY      . COLONOSCOPY  2007   Negative screening study  . HAMMER TOE SURGERY     Bilateral hammer toe amputation  . INCISIONAL HERNIA REPAIR    . KNEE ARTHROSCOPY W/ MENISCAL REPAIR  2007   Bilateral  . UMBILICAL HERNIA REPAIR      Allergies  Allergen Reactions  . Codeine Anaphylaxis and Hives  . Morphine And Related Anaphylaxis and Hives  . Penicillins Anaphylaxis  . Ace Inhibitors Cough    Outpatient Encounter Medications as of 06/27/2017  Medication Sig  . acetaminophen (TYLENOL) 325 MG tablet Take 650 mg by mouth every 6 (six) hours as needed.  Marland Kitchen alendronate (FOSAMAX) 70 MG tablet Give 1 tablet by mouth on Sunday. Take with a full glass of water on an empty stomach.  . carboxymethylcellulose (REFRESH PLUS) 0.5 % SOLN Place 1 drop into both eyes 4 (four) times daily.  . cholecalciferol (VITAMIN D) 1000 UNITS tablet Take 1,000 Units by mouth daily.  . citalopram (CELEXA) 20 MG tablet Take 1 tablet (20 mg total) by mouth daily.  . digoxin (LANOXIN) 0.125 MG tablet Take 1 tablet by mouth once a day (HOLD FOR AP UNDER 60)  . docusate sodium (COLACE) 100 MG capsule Take 200 mg by mouth daily as needed.   . furosemide (LASIX) 20 MG tablet Take  60 mg by mouth.   . insulin NPH-regular Human (NOVOLIN 70/30) (70-30) 100 UNIT/ML injection Give 30 units subcutaneous once a evening  . insulin NPH-regular Human (NOVOLIN 70/30) (70-30) 100 UNIT/ML injection Give 55 units subcutaneous one a morning  . Insulin Pen Needle (EASY TOUCH PEN NEEDLES) 31G X 8 MM MISC Use twice a day  . losartan (COZAAR) 25 MG tablet Take 25 mg by mouth daily.  . memantine (NAMENDA) 10 MG tablet Take 10 mg by mouth 2 (two) times daily.  . metoprolol tartrate (LOPRESSOR) 25 MG tablet Take 75 mg by mouth 2 (two) times daily.  . Multiple Vitamin (MULTIVITAMIN WITH MINERALS) TABS Take 1 tablet by mouth every morning.  Marland Kitchen POTASSIUM CHLORIDE ER PO Potassium chloride extended release capsule, give 20 meq by mouth once day  .  pravastatin (PRAVACHOL) 20 MG tablet Take 20 mg by mouth daily. Take along with 40 mg to = 60 mg  . pravastatin (PRAVACHOL) 40 MG tablet Take 40 mg by mouth daily. Take along with 20 mg to = 60 mg  . sitaGLIPtin-metformin (JANUMET) 50-500 MG tablet Take 1 tablet by mouth 2 (two) times daily with a meal.  . XARELTO 20 MG TABS tablet TAKE (1) TABLET BY MOUTH ONCE DAILY.  . [DISCONTINUED] K-DUR 20 MEQ tablet TAKE 2 TABLETS BY MOUTH ONCE DAILY.  . [DISCONTINUED] pravastatin (PRAVACHOL) 40 MG tablet Take 1 tablet (40 mg total) by mouth every evening.   No facility-administered encounter medications on file as of 06/27/2017.      Review of Systems  Immunization History  Administered Date(s) Administered  . Influenza-Unspecified 02/04/2016, 02/03/2017  . Pneumococcal Conjugate-13 02/03/2017  . Pneumococcal-Unspecified 02/08/2016  . Tdap 01/19/2017   Pertinent  Health Maintenance Due  Topic Date Due  . URINE MICROALBUMIN  07/25/2017 (Originally 05/11/2017)  . HEMOGLOBIN A1C  11/13/2017  . FOOT EXAM  01/25/2018  . OPHTHALMOLOGY EXAM  02/22/2018  . INFLUENZA VACCINE  Completed  . DEXA SCAN  Completed  . PNA vac Low Risk Adult  Completed   Fall Risk  01/19/2017 11/06/2015 09/29/2015 08/31/2015 06/30/2015  Falls in the past year? No Yes No No No  Number falls in past yr: - 2 or more - - -  Injury with Fall? - No - - -   Functional Status Survey:    There were no vitals filed for this visit. There is no height or weight on file to calculate BMI. Physical Exam  Labs reviewed: Recent Labs    02/15/17 0731 04/13/17 0703 06/06/17 0738  NA 138 135 138  K 4.1 4.0 4.3  CL 99* 101 100*  CO2 31 28 28   GLUCOSE 130* 228* 127*  BUN 27* 27* 26*  CREATININE 1.03* 0.88 1.04*  CALCIUM 9.0 9.1 9.0   Recent Labs    11/14/16 0730  AST 15  ALT 15  ALKPHOS 48  BILITOT 0.7  PROT 7.2  ALBUMIN 3.2*   Recent Labs    11/14/16 0730  01/16/17 0645 04/13/17 0703 06/06/17 0738  WBC 6.6   < > 8.5  8.1 9.3  NEUTROABS 3.6  --   --  5.4 6.2  HGB 12.6   < > 12.9 12.5 12.0  HCT 39.4   < > 41.5 40.7 38.8  MCV 92.7   < > 93.5 94.9 92.8  PLT 231   < > 239 231 233   < > = values in this interval not displayed.   Lab Results  Component Value Date  TSH 3.931 01/16/2017   Lab Results  Component Value Date   HGBA1C 6.4 (H) 05/16/2017   Lab Results  Component Value Date   CHOL 156 05/16/2017   HDL 38 (L) 05/16/2017   LDLCALC 104 (H) 05/16/2017   TRIG 72 05/16/2017   CHOLHDL 4.1 05/16/2017    Significant Diagnostic Results in last 30 days:  No results found.  Assessment/Plan There are no diagnoses linked to this encounter.   Family/ staff Communication:   Labs/tests ordered:    This encounter was created in error - please disregard.

## 2017-06-27 NOTE — Progress Notes (Signed)
Location:   [facility] Room Number: 113/W Place of Service:  SNF [phone]) Provider:  Dionicia Abler, MD  Patient Care Team: Celedonio Savage, MD as PCP - General (Family Medicine) Rothbart, Cristopher Estimable, MD (Cardiology) Lendon Colonel, NP as Nurse Practitioner (Nurse Practitioner)  Extended Emergency Contact Information Primary Emergency Contact: [facility] Address: [address]          [place], [place] [address] [name] of [place] Phone: [phone] Mobile Phone: [phone] Relation: Daughter  Code Status:  DNR Goals of care: Advanced Directive information Advanced Directives 06/27/2017  Does Patient Have a Medical Advance Directive? Yes  Type of Advance Directive Out of facility DNR (pink MOST or yellow form)  Does patient want to make changes to medical advance directive? No - Patient declined  Copy of [place] in Chart? No - copy requested  Pre-existing out of facility DNR order (yellow form or pink MOST form) -     Chief complaint-acute visit secondary to cough--- positive influenza A culture  HPI:  Pt is a 77 y.o. female seen today for an acute visit for apparently she has had this for the last day-patient just did not appear to be feeling like herself this morning flu culture was done which actually has come back positive for influenza A.  She usually has a vigorous appetite is not eating that much today her O2 saturations are stable at 92% she is not complaining of any GI discomfort shortness of breath or chest pain   She does have a previous history of dementia as well as type 2 diabetes A. fib diastolic CHF as well as hypertension osteoporosis hyperlipidemia and vitamin D deficiency.      Past Medical History:  Diagnosis Date  . Allergy   . Atrial fibrillation ([facility]) 08/2011   First diagnosed in 08/2011; duration of arrhythmia is uncertain  . Bilateral lower extremity edema   . Chronic diarrhea    diverticulosis  . COPD (chronic obstructive pulmonary disease) ([place])   . Dementia   . Diabetes mellitus, type 2 (HCC)    Diabetic neuropathy  . Dyspnea on exertion    pedal edema  . Gout   . Headache(784.0)    twice weekly  . Hyperlipidemia   . Hypertension   . Osteopenia    DEXA scan 01/2010  . Palpitations   . Seasonal allergies   . Stress incontinence   . Vertigo    Past Surgical History:  Procedure Laterality Date  . APPENDECTOMY    . BREAST BIOPSY  2002   Right  . CESAREAN SECTION     x 2  . CHOLECYSTECTOMY    . COLONOSCOPY  2007   Negative screening study  . HAMMER TOE SURGERY     Bilateral hammer toe amputation  . INCISIONAL HERNIA REPAIR    . KNEE ARTHROSCOPY W/ MENISCAL REPAIR  2007   Bilateral  . UMBILICAL HERNIA REPAIR      Allergies  Allergen Reactions  . Codeine Anaphylaxis and Hives  . Morphine And Related Anaphylaxis and Hives  . Penicillins Anaphylaxis  . Ace Inhibitors Cough    Outpatient Encounter Medications as of 06/27/2017  Medication Sig  . acetaminophen (TYLENOL) 325 MG tablet Take 650 mg by mouth every 6 (six) hours as needed.  Marland Kitchen alendronate (FOSAMAX) 70 MG tablet Give 1 tablet by mouth on Sunday. Take with a full glass of water on an empty stomach.  . carboxymethylcellulose (REFRESH  PLUS) 0.5 % SOLN Place 1 drop into both eyes 4 (four) times daily.  . cholecalciferol (VITAMIN D) 1000 UNITS tablet Take 1,000 Units by mouth daily.  . citalopram (CELEXA) 20 MG tablet Take 1 tablet (20 mg total) by mouth daily.  . digoxin (LANOXIN) 0.125 MG tablet Take 1 tablet by mouth once a day (HOLD FOR AP UNDER 60)  . docusate sodium (COLACE) 100 MG capsule Take 200 mg by mouth daily as needed.   . furosemide (LASIX) 20 MG tablet Take 60 mg by mouth.   . insulin NPH-regular Human (NOVOLIN 70/30) (70-30) 100 UNIT/ML injection Give 30 units subcutaneous once a evening  . insulin NPH-regular Human (NOVOLIN 70/30) (70-30) 100 UNIT/ML injection Give 55 units  subcutaneous one a morning  . Insulin Pen Needle (EASY TOUCH PEN NEEDLES) 31G X 8 MM MISC Use twice a day  . losartan (COZAAR) 25 MG tablet Take 25 mg by mouth daily.  . memantine (NAMENDA) 10 MG tablet Take 10 mg by mouth 2 (two) times daily.  . metoprolol tartrate (LOPRESSOR) 25 MG tablet Take 75 mg by mouth 2 (two) times daily.  . Multiple Vitamin (MULTIVITAMIN WITH MINERALS) TABS Take 1 tablet by mouth every morning.  Marland Kitchen POTASSIUM CHLORIDE ER PO Potassium chloride extended release capsule, give 20 meq by mouth once day  . pravastatin (PRAVACHOL) 20 MG tablet Take 20 mg by mouth daily. Take along with 40 mg to = 60 mg  . pravastatin (PRAVACHOL) 40 MG tablet Take 40 mg by mouth daily. Take along with 20 mg to = 60 mg  . sitaGLIPtin-metformin (JANUMET) 50-500 MG tablet Take 1 tablet by mouth 2 (two) times daily with a meal.  . XARELTO 20 MG TABS tablet TAKE (1) TABLET BY MOUTH ONCE DAILY.  . [DISCONTINUED] K-DUR 20 MEQ tablet TAKE 2 TABLETS BY MOUTH ONCE DAILY.   No facility-administered encounter medications on file as of 06/27/2017.     Review of Systems   Is not really complaining of fever chills does not complain of shortness of breath she does have a cough says she just feels kind of weak and blah her appetite is not as good as it has been  Immunization History  Administered Date(s) Administered  . Influenza-Unspecified 02/04/2016, 02/03/2017  . Pneumococcal Conjugate-13 02/03/2017  . Pneumococcal-Unspecified 02/08/2016  . Tdap 01/19/2017   Pertinent  Health Maintenance Due  Topic Date Due  . URINE MICROALBUMIN  07/25/2017 (Originally 05/11/2017)  . HEMOGLOBIN A1C  11/13/2017  . FOOT EXAM  01/25/2018  . OPHTHALMOLOGY EXAM  02/22/2018  . INFLUENZA VACCINE  Completed  . DEXA SCAN  Completed  . PNA vac Low Risk Adult  Completed   Fall Risk  01/19/2017 11/06/2015 09/29/2015 08/31/2015 06/30/2015  Falls in the past year? No Yes No No No  Number falls in past yr: - 2 or more - - -    Injury with Fall? - No - - -   Functional Status Survey:    Vitals:   06/27/17 1130  BP: 120/74  Pulse: 81  Resp: 18  Temp: 98.7 F (37.1 C)  TempSrc: Oral  SpO2: 93%    Physical Exam  In general this is a pleasant elderly female in no distress sitting comfortably in her wheelchair she does look somewhat fatigued.  Skin is warm and dry.   EYES Appear reactive to light sclera and conjunctive are clear visual acuity appears grossly intact  Oropharynx is clear mucous membranes moist.  Chest is clear  to auscultation air entry there is no labored breathing.  Heart is regular irregular rate and rhythm without murmur gallop or rub she has baseline venous stasis  Her abdomen is somewhat obese soft nontender with positive bowel sounds  Musculoskeletal does move her extremities for largely in place in wheelchair.  Continues with baseline lower extremity edema-venous stasis changes.  Neurologic is grossly intact clear could not really appreciate lateralizing findings   Psych she is oriented to self follow simple verbal commands--to be pleasant not as energetic as I am used to seeing her   Labs reviewed: Recent Labs    02/15/17 0731 04/13/17 0703 06/06/17 0738  NA 138 135 138  K 4.1 4.0 4.3  CL 99* 101 100*  CO2 31 28 28   GLUCOSE 130* 228* 127*  BUN 27* 27* 26*  CREATININE 1.03* 0.88 1.04*  CALCIUM 9.0 9.1 9.0   Recent Labs    11/14/16 0730  AST 15  ALT 15  ALKPHOS 48  BILITOT 0.7  PROT 7.2  ALBUMIN 3.2*   Recent Labs    11/14/16 0730  01/16/17 0645 04/13/17 0703 06/06/17 0738  WBC 6.6   < > 8.5 8.1 9.3  NEUTROABS 3.6  --   --  5.4 6.2  HGB 12.6   < > 12.9 12.5 12.0  HCT 39.4   < > 41.5 40.7 38.8  MCV 92.7   < > 93.5 94.9 92.8  PLT 231   < > 239 231 233   < > = values in this interval not displayed.   Lab Results  Component Value Date   TSH 3.931 01/16/2017   Lab Results  Component Value Date   HGBA1C 6.4 (H) 05/16/2017   Lab Results   Component Value Date   CHOL 156 05/16/2017   HDL 38 (L) 05/16/2017   LDLCALC 104 (H) 05/16/2017   TRIG 72 05/16/2017   CHOLHDL 4.1 05/16/2017    Significant Diagnostic Results in last 30 days:  No results found.  Assessment/Plan  #1-history of cough with positive influenza A culture-new  --diagnoses --we will treat with Tamiflu with estimated GFR of 54 will treat with reduced dose of 30 mg twice daily for 5 days.  Also will add Mucinex 600 mg twice daily for 7 days.  Monitor vital signs and pulse ox every shift for 72 hours-also will treat her roommate prophylactically.  Clinically she appears to be stable but not feeling 100%- will need continued monitoring and initiation of the Tamiflu.  2.  Diastolic CHF this appears to be stable lung exam is fairly benign her weight has been stable she continues on Lasix as well as a beta-blocker will continue to monitor  [phone]

## 2017-06-28 ENCOUNTER — Encounter (HOSPITAL_COMMUNITY)
Admission: AD | Admit: 2017-06-28 | Discharge: 2017-06-28 | Disposition: A | Discharge status: Home / Self Care | Payer: Medicare Other | Source: Skilled Nursing Facility | Attending: Internal Medicine | Admitting: Internal Medicine

## 2017-06-28 DIAGNOSIS — F0390 Unspecified dementia without behavioral disturbance: Secondary | ICD-10-CM | POA: Insufficient documentation

## 2017-06-28 DIAGNOSIS — I5042 Chronic combined systolic (congestive) and diastolic (congestive) heart failure: Secondary | ICD-10-CM | POA: Insufficient documentation

## 2017-06-28 DIAGNOSIS — J101 Influenza due to other identified influenza virus with other respiratory manifestations: Secondary | ICD-10-CM | POA: Insufficient documentation

## 2017-07-18 ENCOUNTER — Encounter (HOSPITAL_COMMUNITY)
Admission: RE | Admit: 2017-07-18 | Discharge: 2017-07-18 | Disposition: A | Discharge status: Home / Self Care | Payer: Medicare Other | Source: Skilled Nursing Facility | Attending: Internal Medicine | Admitting: Internal Medicine

## 2017-07-18 DIAGNOSIS — F0390 Unspecified dementia without behavioral disturbance: Secondary | ICD-10-CM | POA: Diagnosis not present

## 2017-07-18 DIAGNOSIS — M810 Age-related osteoporosis without current pathological fracture: Secondary | ICD-10-CM | POA: Insufficient documentation

## 2017-07-18 DIAGNOSIS — I5042 Chronic combined systolic (congestive) and diastolic (congestive) heart failure: Secondary | ICD-10-CM | POA: Diagnosis not present

## 2017-07-18 DIAGNOSIS — L03119 Cellulitis of unspecified part of limb: Secondary | ICD-10-CM | POA: Diagnosis not present

## 2017-07-18 LAB — VITAMIN B12: Vitamin B-12: 695 pg/mL (ref 180–914)

## 2017-07-20 DIAGNOSIS — B078 Other viral warts: Secondary | ICD-10-CM | POA: Diagnosis not present

## 2017-07-24 ENCOUNTER — Non-Acute Institutional Stay (SKILLED_NURSING_FACILITY): Payer: Medicare Other | Admitting: Internal Medicine

## 2017-07-24 ENCOUNTER — Encounter: Payer: Self-pay | Admitting: Internal Medicine

## 2017-07-24 DIAGNOSIS — E785 Hyperlipidemia, unspecified: Secondary | ICD-10-CM

## 2017-07-24 DIAGNOSIS — I482 Chronic atrial fibrillation, unspecified: Secondary | ICD-10-CM

## 2017-07-24 DIAGNOSIS — F0391 Unspecified dementia with behavioral disturbance: Secondary | ICD-10-CM

## 2017-07-24 DIAGNOSIS — R1084 Generalized abdominal pain: Secondary | ICD-10-CM

## 2017-07-24 DIAGNOSIS — Z794 Long term (current) use of insulin: Secondary | ICD-10-CM | POA: Diagnosis not present

## 2017-07-24 DIAGNOSIS — I10 Essential (primary) hypertension: Secondary | ICD-10-CM

## 2017-07-24 DIAGNOSIS — E1140 Type 2 diabetes mellitus with diabetic neuropathy, unspecified: Secondary | ICD-10-CM

## 2017-07-24 DIAGNOSIS — K5900 Constipation, unspecified: Secondary | ICD-10-CM | POA: Diagnosis not present

## 2017-07-24 NOTE — Progress Notes (Signed)
Location:   [facility] Room Number: 113/W Place of Service:  SNF [phone]) Provider:  Jefm Bryant, MD  Patient Care Team: Celedonio Savage, MD as PCP - General (Family Medicine) Rothbart, Cristopher Estimable, MD (Cardiology) Lendon Colonel, NP as Nurse Practitioner (Nurse Practitioner)  Extended Emergency Contact Information Primary Emergency Contact: [facility] Address: [address]          [place], [place] [address] [name] of [place] Phone: [phone] Mobile Phone: [phone] Relation: Daughter  Code Status:  DNR Goals of care: Advanced Directive information Advanced Directives 07/24/2017  Does Patient Have a Medical Advance Directive? Yes  Type of Advance Directive Out of facility DNR (pink MOST or yellow form)  Does patient want to make changes to medical advance directive? No - Patient declined  Copy of [place] in Chart? No - copy requested  Pre-existing out of facility DNR order (yellow form or pink MOST form) -     Chief Complaint  Patient presents with  . Medical Management of Chronic Issues    Routine Visit,     HPI:  Pt is a 77 y.o. female seen today for medical management of chronic diseases.    Patient has h/o Diabetes mellitus type 2 , atrial fibrillation on chronic anticoagulation, Venous stasis, Dementia with Behavior problems,  S/P fall leading to Ocular floor fracture.Cellulitis oF LE  Patient today was c/o Abdominal pain. More around Mid abdomen. She says' it aches'' No Nausea or Vomiting. Is passing Flatus. No Fever or chills. She ate Good breakfast today and while I was talking to her she says that she is hungry again. She is constipated and per Nurses has not moved Bowels since 03/22  She was also Diagnosed with Influenza in the facility last month and received treatment with Tamiflu. She seems to have recovered completely from that. Her weight is stable at 213 Lbs. No New Nursing  issues. Her BS are stable mostly less then 200.     Past Medical History:  Diagnosis Date  . Allergy   . Atrial fibrillation ([facility]) 08/2011   First diagnosed in 08/2011; duration of arrhythmia is uncertain  . Bilateral lower extremity edema   . Chronic diarrhea    diverticulosis  . COPD (chronic obstructive pulmonary disease) ([facility])   . Dementia   . Diabetes mellitus, type 2 (H[facility])    Diabetic neuropathy  . Dyspnea on exertion    pedal edema  . Gout   . Headache(784.0)    twice weekly  . Hyperlipidemia   . Hypertension   . Osteopenia    DEXA scan 01/2010  . Palpitations   . Seasonal allergies   . Stress incontinence   . Vertigo    Past Surgical History:  Procedure Laterality Date  . APPENDECTOMY    . BREAST BIOPSY  2002   Right  . CESAREAN SECTION     x 2  . CHOLECYSTECTOMY    . COLONOSCOPY  2007   Negative screening study  . HAMMER TOE SURGERY     Bilateral hammer toe amputation  . INCISIONAL HERNIA REPAIR    . KNEE ARTHROSCOPY W/ MENISCAL REPAIR  2007   Bilateral  . UMBILICAL HERNIA REPAIR      Allergies  Allergen Reactions  . Codeine Anaphylaxis and Hives  . Morphine And Related Anaphylaxis and Hives  . Penicillins Anaphylaxis  . Ace Inhibitors Cough    Outpatient Encounter Medications as of 07/24/2017  Medication  Sig  . acetaminophen (TYLENOL) 325 MG tablet Take 650 mg by mouth every 6 (six) hours as needed.  Marland Kitchen alendronate (FOSAMAX) 70 MG tablet Give 1 tablet by mouth on Sunday. Take with a full glass of water on an empty stomach.  . carboxymethylcellulose (REFRESH PLUS) 0.5 % SOLN Place 1 drop into both eyes 4 (four) times daily.  . cholecalciferol (VITAMIN D) 1000 UNITS tablet Take 1,000 Units by mouth daily.  . citalopram (CELEXA) 20 MG tablet Take 1 tablet (20 mg total) by mouth daily.  . digoxin (LANOXIN) 0.125 MG tablet Take 1 tablet by mouth once a day (HOLD FOR AP UNDER 60)  . docusate sodium (COLACE) 100 MG capsule Take 200 mg by mouth daily as  needed.   . furosemide (LASIX) 20 MG tablet Take 60 mg by mouth.   . insulin NPH-regular Human (NOVOLIN 70/30) (70-30) 100 UNIT/ML injection Give 30 units subcutaneous once a evening  . insulin NPH-regular Human (NOVOLIN 70/30) (70-30) 100 UNIT/ML injection Give 55 units subcutaneous one a morning  . Insulin Pen Needle (EASY TOUCH PEN NEEDLES) 31G X 8 MM MISC Use twice a day  . losartan (COZAAR) 25 MG tablet Take 25 mg by mouth daily.  . memantine (NAMENDA) 10 MG tablet Take 10 mg by mouth 2 (two) times daily.  . metoprolol tartrate (LOPRESSOR) 25 MG tablet Take 75 mg by mouth 2 (two) times daily.  . Multiple Vitamin (MULTIVITAMIN WITH MINERALS) TABS Take 1 tablet by mouth every morning.  Marland Kitchen POTASSIUM CHLORIDE ER PO Potassium chloride extended release capsule, give 20 meq by mouth once day  . pravastatin (PRAVACHOL) 20 MG tablet Take 20 mg by mouth daily. Take along with 40 mg to = 60 mg  . pravastatin (PRAVACHOL) 40 MG tablet Take 40 mg by mouth daily. Take along with 20 mg to = 60 mg  . sitaGLIPtin-metformin (JANUMET) 50-500 MG tablet Take 1 tablet by mouth 2 (two) times daily with a meal.  . XARELTO 20 MG TABS tablet TAKE (1) TABLET BY MOUTH ONCE DAILY.  . [DISCONTINUED] K-DUR 20 MEQ tablet TAKE 2 TABLETS BY MOUTH ONCE DAILY.   No facility-administered encounter medications on file as of 07/24/2017.      Review of Systems  Review of Systems  Constitutional: Negative for activity change, appetite change, chills, diaphoresis, fatigue and fever.  HENT: Negative for mouth sores, postnasal drip, rhinorrhea, sinus pain and sore throat.   Respiratory: Negative for apnea, cough, chest tightness, shortness of breath and wheezing.   Cardiovascular: Negative for chest pain, palpitations and leg swelling.  Gastrointestinal: Positive for abdominal distention, abdominal pain, constipation Genitourinary: Negative for dysuria and frequency.  Musculoskeletal: Negative for arthralgias, joint swelling and  myalgias.  Skin: Negative for rash.  Neurological: Negative for dizziness, syncope, weakness, light-headedness and numbness.  Psychiatric/Behavioral: Negative for behavioral problems, confusion and sleep disturbance.     Immunization History  Administered Date(s) Administered  . Influenza-Unspecified 02/04/2016, 02/03/2017  . Pneumococcal Conjugate-13 02/03/2017  . Pneumococcal-Unspecified 02/08/2016  . Tdap 01/19/2017   Pertinent  Health Maintenance Due  Topic Date Due  . URINE MICROALBUMIN  07/25/2017 (Originally 05/11/2017)  . HEMOGLOBIN A1C  11/13/2017  . FOOT EXAM  01/25/2018  . OPHTHALMOLOGY EXAM  02/22/2018  . INFLUENZA VACCINE  Completed  . DEXA SCAN  Completed  . PNA vac Low Risk Adult  Completed   Fall Risk  01/19/2017 11/06/2015 09/29/2015 08/31/2015 06/30/2015  Falls in the past year? No Yes No No No  Number falls in past yr: - 2 or more - - -  Injury with Fall? - No - - -   Functional Status Survey:    Vitals:   07/24/17 1207  BP: (!) 109/51  Pulse: 84  Resp: 20  Temp: 97.8 F (36.6 C)  TempSrc: Oral  SpO2: 98%   There is no height or weight on file to calculate BMI. Physical Exam  Constitutional: She appears well-developed and well-nourished.  HENT:  Head: Normocephalic.  Mouth/Throat: Oropharynx is clear and moist.  Eyes: Pupils are equal, round, and reactive to light.  Neck: Neck supple.  Cardiovascular: Normal rate and normal heart sounds. An irregular rhythm present.  Pulmonary/Chest: Effort normal and breath sounds normal. No respiratory distress. She has no wheezes. She has no rales.  Abdominal: Soft. Bowel sounds are normal. She exhibits no distension. There is no tenderness. There is no rebound.  Musculoskeletal:  Chronic Edema with Venous stasis Changes  Lymphadenopathy:    She has no cervical adenopathy.  Neurological: She is alert.  Skin: Skin is warm and dry.  Psychiatric: She has a normal mood and affect. Her behavior is normal. Thought  content normal.    Labs reviewed: Recent Labs    04/13/17 0703 06/06/17 0738 06/27/17 1145  NA 135 138 135  K 4.0 4.3 4.3  CL 101 100* 100*  CO2 28 28 23   GLUCOSE 228* 127* 205*  BUN 27* 26* 24*  CREATININE 0.88 1.04* 1.10*  CALCIUM 9.1 9.0 9.2   Recent Labs    11/14/16 0730  AST 15  ALT 15  ALKPHOS 48  BILITOT 0.7  PROT 7.2  ALBUMIN 3.2*   Recent Labs    11/14/16 0730  01/16/17 0645 04/13/17 0703 06/06/17 0738  WBC 6.6   < > 8.5 8.1 9.3  NEUTROABS 3.6  --   --  5.4 6.2  HGB 12.6   < > 12.9 12.5 12.0  HCT 39.4   < > 41.5 40.7 38.8  MCV 92.7   < > 93.5 94.9 92.8  PLT 231   < > 239 231 233   < > = values in this interval not displayed.   Lab Results  Component Value Date   TSH 3.931 01/16/2017   Lab Results  Component Value Date   HGBA1C 6.4 (H) 05/16/2017   Lab Results  Component Value Date   CHOL 156 05/16/2017   HDL 38 (L) 05/16/2017   LDLCALC 104 (H) 05/16/2017   TRIG 72 05/16/2017   CHOLHDL 4.1 05/16/2017    Significant Diagnostic Results in last 30 days:  No results found.  Assessment/Plan  Abdominal Pain Abdominal Series was done in Facility and it showed Moderate Constipation Will start her on Miralax.  Essential hypertension Patient BP controlled on Lopressor, Losartan and Lasix.  Chronic A. fib Rate control on Lopressor and digoxin Dig level was normal in 10/18 On Chronic Xarelto  Type 2 diabetes with diabetes neuropathy A1c 6.4 in 01/19 Blood sugars well controlled. 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

## 2017-08-03 DIAGNOSIS — Z89422 Acquired absence of other left toe(s): Secondary | ICD-10-CM | POA: Diagnosis not present

## 2017-08-03 DIAGNOSIS — B351 Tinea unguium: Secondary | ICD-10-CM | POA: Diagnosis not present

## 2017-08-03 DIAGNOSIS — R262 Difficulty in walking, not elsewhere classified: Secondary | ICD-10-CM | POA: Diagnosis not present

## 2017-08-06 ENCOUNTER — Encounter (HOSPITAL_COMMUNITY)
Admission: RE | Admit: 2017-08-06 | Discharge: 2017-08-06 | Disposition: A | Discharge status: Home / Self Care | Payer: Medicare Other | Source: Skilled Nursing Facility | Attending: Internal Medicine | Admitting: Internal Medicine

## 2017-08-06 DIAGNOSIS — E1140 Type 2 diabetes mellitus with diabetic neuropathy, unspecified: Secondary | ICD-10-CM | POA: Diagnosis not present

## 2017-08-06 LAB — OCCULT BLOOD X 1 CARD TO LAB, STOOL: Fecal Occult Bld: POSITIVE — AB

## 2017-08-08 ENCOUNTER — Other Ambulatory Visit: Payer: Self-pay

## 2017-08-08 ENCOUNTER — Encounter (HOSPITAL_COMMUNITY)
Admission: RE | Admit: 2017-08-08 | Discharge: 2017-08-08 | Disposition: A | Discharge status: Home / Self Care | Payer: Medicare Other | Source: Skilled Nursing Facility | Attending: *Deleted | Admitting: *Deleted

## 2017-08-08 ENCOUNTER — Encounter (HOSPITAL_COMMUNITY): Payer: Self-pay

## 2017-08-08 ENCOUNTER — Non-Acute Institutional Stay (SKILLED_NURSING_FACILITY): Payer: Medicare Other | Admitting: Internal Medicine

## 2017-08-08 ENCOUNTER — Encounter: Payer: Self-pay | Admitting: Internal Medicine

## 2017-08-08 ENCOUNTER — Emergency Department (HOSPITAL_COMMUNITY)
Admission: EM | Admit: 2017-08-08 | Discharge: 2017-08-09 | Disposition: A | Discharge status: Home / Self Care | Payer: Medicare Other | Attending: Emergency Medicine | Admitting: Emergency Medicine

## 2017-08-08 DIAGNOSIS — E119 Type 2 diabetes mellitus without complications: Secondary | ICD-10-CM | POA: Insufficient documentation

## 2017-08-08 DIAGNOSIS — R195 Other fecal abnormalities: Secondary | ICD-10-CM

## 2017-08-08 DIAGNOSIS — Z794 Long term (current) use of insulin: Secondary | ICD-10-CM | POA: Insufficient documentation

## 2017-08-08 DIAGNOSIS — Z79899 Other long term (current) drug therapy: Secondary | ICD-10-CM | POA: Diagnosis not present

## 2017-08-08 DIAGNOSIS — I482 Chronic atrial fibrillation, unspecified: Secondary | ICD-10-CM

## 2017-08-08 DIAGNOSIS — J449 Chronic obstructive pulmonary disease, unspecified: Secondary | ICD-10-CM | POA: Diagnosis not present

## 2017-08-08 DIAGNOSIS — E1140 Type 2 diabetes mellitus with diabetic neuropathy, unspecified: Secondary | ICD-10-CM | POA: Diagnosis not present

## 2017-08-08 DIAGNOSIS — Z7902 Long term (current) use of antithrombotics/antiplatelets: Secondary | ICD-10-CM | POA: Diagnosis not present

## 2017-08-08 DIAGNOSIS — Z7901 Long term (current) use of anticoagulants: Secondary | ICD-10-CM | POA: Diagnosis not present

## 2017-08-08 DIAGNOSIS — R410 Disorientation, unspecified: Secondary | ICD-10-CM | POA: Diagnosis not present

## 2017-08-08 DIAGNOSIS — K625 Hemorrhage of anus and rectum: Secondary | ICD-10-CM | POA: Insufficient documentation

## 2017-08-08 DIAGNOSIS — I10 Essential (primary) hypertension: Secondary | ICD-10-CM | POA: Diagnosis not present

## 2017-08-08 DIAGNOSIS — F0390 Unspecified dementia without behavioral disturbance: Secondary | ICD-10-CM | POA: Insufficient documentation

## 2017-08-08 LAB — DIGOXIN LEVEL: Digoxin Level: 0.9 ng/mL (ref 0.8–2.0)

## 2017-08-08 LAB — COMPREHENSIVE METABOLIC PANEL
ALBUMIN: 3.3 g/dL — AB (ref 3.5–5.0)
ALK PHOS: 54 U/L (ref 38–126)
ALT: 15 U/L (ref 14–54)
AST: 21 U/L (ref 15–41)
Anion gap: 12 (ref 5–15)
BUN: 25 mg/dL — ABNORMAL HIGH (ref 6–20)
CALCIUM: 8.9 mg/dL (ref 8.9–10.3)
CO2: 25 mmol/L (ref 22–32)
CREATININE: 1.13 mg/dL — AB (ref 0.44–1.00)
Chloride: 98 mmol/L — ABNORMAL LOW (ref 101–111)
GFR calc Af Amer: 53 mL/min — ABNORMAL LOW (ref >60)
GFR calc non Af Amer: 46 mL/min — ABNORMAL LOW (ref >60)
GLUCOSE: 189 mg/dL — AB (ref 65–99)
Potassium: 4.1 mmol/L (ref 3.5–5.1)
SODIUM: 135 mmol/L (ref 135–145)
Total Bilirubin: 0.5 mg/dL (ref 0.3–1.2)
Total Protein: 7.5 g/dL (ref 6.5–8.1)

## 2017-08-08 LAB — CBC WITH DIFFERENTIAL/PLATELET
BASOS ABS: 0 10*3/uL (ref 0.0–0.1)
BASOS PCT: 0 %
Basophils Absolute: 0 10*3/uL (ref 0.0–0.1)
Basophils Relative: 0 %
EOS ABS: 0.3 10*3/uL (ref 0.0–0.7)
Eosinophils Absolute: 0.3 10*3/uL (ref 0.0–0.7)
Eosinophils Relative: 3 %
Eosinophils Relative: 3 %
HCT: 35.2 % — ABNORMAL LOW (ref 36.0–46.0)
HCT: 33.6 % — ABNORMAL LOW (ref 36.0–46.0)
HEMOGLOBIN: 10.8 g/dL — AB (ref 12.0–15.0)
HEMOGLOBIN: 10.3 g/dL — AB (ref 12.0–15.0)
LYMPHS ABS: 1.7 10*3/uL (ref 0.7–4.0)
Lymphocytes Relative: 21 %
Lymphocytes Relative: 18 %
Lymphs Abs: 1.9 10*3/uL (ref 0.7–4.0)
MCH: 28.3 pg (ref 26.0–34.0)
MCH: 28.6 pg (ref 26.0–34.0)
MCHC: 30.7 g/dL (ref 30.0–36.0)
MCHC: 30.7 g/dL (ref 30.0–36.0)
MCV: 92.1 fL (ref 78.0–100.0)
MCV: 93.3 fL (ref 78.0–100.0)
MONOS PCT: 11 %
Monocytes Absolute: 1 10*3/uL (ref 0.1–1.0)
Monocytes Absolute: 1 10*3/uL (ref 0.1–1.0)
Monocytes Relative: 11 %
NEUTROS ABS: 5.9 10*3/uL (ref 1.7–7.7)
NEUTROS PCT: 65 %
NEUTROS PCT: 68 %
Neutro Abs: 6.3 10*3/uL (ref 1.7–7.7)
Platelets: 238 10*3/uL (ref 150–400)
Platelets: 241 10*3/uL (ref 150–400)
RBC: 3.82 MIL/uL — ABNORMAL LOW (ref 3.87–5.11)
RBC: 3.6 MIL/uL — ABNORMAL LOW (ref 3.87–5.11)
RDW: 14.9 % (ref 11.5–15.5)
RDW: 14.8 % (ref 11.5–15.5)
WBC: 9 10*3/uL (ref 4.0–10.5)
WBC: 9.2 10*3/uL (ref 4.0–10.5)

## 2017-08-08 LAB — BASIC METABOLIC PANEL
Anion gap: 11 (ref 5–15)
BUN: 26 mg/dL — AB (ref 6–20)
CHLORIDE: 99 mmol/L — AB (ref 101–111)
CO2: 27 mmol/L (ref 22–32)
CREATININE: 1.08 mg/dL — AB (ref 0.44–1.00)
Calcium: 9 mg/dL (ref 8.9–10.3)
GFR calc Af Amer: 56 mL/min — ABNORMAL LOW (ref >60)
GFR calc non Af Amer: 48 mL/min — ABNORMAL LOW (ref >60)
GLUCOSE: 208 mg/dL — AB (ref 65–99)
Potassium: 4 mmol/L (ref 3.5–5.1)
Sodium: 137 mmol/L (ref 135–145)

## 2017-08-08 LAB — TSH: TSH: 2.822 u[IU]/mL (ref 0.350–4.500)

## 2017-08-08 LAB — OCCULT BLOOD X 1 CARD TO LAB, STOOL: FECAL OCCULT BLD: POSITIVE — AB

## 2017-08-08 NOTE — ED Notes (Signed)
Hemocult Positive

## 2017-08-08 NOTE — ED Notes (Signed)
Have called [facility] to come pick up pt with no answer

## 2017-08-08 NOTE — ED Triage Notes (Signed)
Pt has been having black stools on Sunday per [facility]. Pt's hemocult was positive. Today, pt also had a black stool. Pt is alert and oriented. NAD. Was brought over by NT from [facility]

## 2017-08-08 NOTE — Progress Notes (Signed)
This is an acute visit.  Level of care skilled.  Facility is [facility].  Chief complaint-acute visit secondary to increased confusion- also occult positive stool.  History of present illness.  Patient is a pleasant 77 year old female seen today for increased confusion-as well as Hemoccult positive stool.  Patient has a history of diabetes type 2 as well as atrial fibrillation on chronic anticoagulation with Xarelto as well as venous stasis dementia with behaviors and a history of a fall previously with an ocular floor fracture as well as cellulitis.  She has had a period of stability here-she recently was treated for influenza and appears to has made an unremarkable recovery.   she does have baseline confusion with dementia but appears to be more confused today  somewhat more anxious-saying that she is missing a friend and looking for a friend.  Apparently talking with her nurse today over the weekend it was noted she had some dark colored stool and a stool sample was obtained-this has come back positive and subsequent sample also has come back positive for occult blood.  Currently she is not really complaining of any increased weakness chest pain or palpitations or shortness of breath or abdominal discomfort-- although she is a poor historian secondary to dementia--- vital signs appear to be stable- blood pressure is on the lower end of her baseline   Past Medical History:  Diagnosis Date  . Allergy   . Atrial fibrillation ([place]) 08/2011   First diagnosed in 08/2011; duration of arrhythmia is uncertain  . Bilateral lower extremity edema   . Chronic diarrhea    diverticulosis  . COPD (chronic obstructive pulmonary disease) ([place])   . Dementia   . Diabetes mellitus, type 2 (HCC)    Diabetic neuropathy  . Dyspnea on exertion    pedal edema  . Gout   . Headache(784.0)    twice weekly  . Hyperlipidemia   . Hypertension   . Osteopenia    DEXA scan 01/2010  .  Palpitations   . Seasonal allergies   . Stress incontinence   . Vertigo         Past Surgical History:  Procedure Laterality Date  . APPENDECTOMY    . BREAST BIOPSY  2002   Right  . CESAREAN SECTION     x 2  . CHOLECYSTECTOMY    . COLONOSCOPY  2007   Negative screening study  . HAMMER TOE SURGERY     Bilateral hammer toe amputation  . INCISIONAL HERNIA REPAIR    . KNEE ARTHROSCOPY W/ MENISCAL REPAIR  2007   Bilateral  . UMBILICAL HERNIA REPAIR          Allergies  Allergen Reactions  . Codeine Anaphylaxis and Hives  . Morphine And Related Anaphylaxis and Hives  . Penicillins Anaphylaxis  . Ace Inhibitors Cough        Outpatient Encounter Medications as of 07/24/2017  Medication Sig  . acetaminophen (TYLENOL) 325 MG tablet Take 650 mg by mouth every 6 (six) hours as needed.  Marland Kitchen alendronate (FOSAMAX) 70 MG tablet Give 1 tablet by mouth on Sunday. Take with a full glass of water on an empty stomach.  . carboxymethylcellulose (REFRESH PLUS) 0.5 % SOLN Place 1 drop into both eyes 4 (four) times daily.  . cholecalciferol (VITAMIN D) 1000 UNITS tablet Take 1,000 Units by mouth daily.  . citalopram (CELEXA) 20 MG tablet Take 1 tablet (20 mg total) by mouth daily.  . digoxin (LANOXIN) 0.125 MG tablet  Take 1 tablet by mouth once a day (HOLD FOR AP UNDER 60)  . docusate sodium (COLACE) 100 MG capsule Take 200 mg by mouth daily as needed.   . furosemide (LASIX) 20 MG tablet Take 60 mg by mouth.   . insulin NPH-regular Human (NOVOLIN 70/30) (70-30) 100 UNIT/ML injection Give 30 units subcutaneous once a evening  . insulin NPH-regular Human (NOVOLIN 70/30) (70-30) 100 UNIT/ML injection Give 55 units subcutaneous one a morning  . Insulin Pen Needle (EASY TOUCH PEN NEEDLES) 31G X 8 MM MISC Use twice a day  . losartan (COZAAR) 25 MG tablet Take 25 mg by mouth daily.  . memantine (NAMENDA) 10 MG tablet Take 10 mg by mouth 2 (two) times daily.  . metoprolol  tartrate (LOPRESSOR) 25 MG tablet Take 75 mg by mouth 2 (two) times daily.  . Multiple Vitamin (MULTIVITAMIN WITH MINERALS) TABS Take 1 tablet by mouth every morning.  Marland Kitchen POTASSIUM CHLORIDE ER PO Potassium chloride extended release capsule, give 20 meq by mouth once day  . pravastatin (PRAVACHOL) 20 MG tablet Take 20 mg by mouth daily. Take along with 40 mg to = 60 mg  . pravastatin (PRAVACHOL) 40 MG tablet Take 40 mg by mouth daily. Take along with 20 mg to = 60 mg  . sitaGLIPtin-metformin (JANUMET) 50-500 MG tablet Take 1 tablet by mouth 2 (two) times daily with a meal.  . XARELTO 20 MG TABS tablet TAKE (1) TABLET BY MOUTH ONCE DAILY.  . [DISCONTINUED] K-DUR 20 MEQ tablet TAKE 2 TABLETS BY MOUTH ONCE DAILY.   No facility-administered encounter medications on file as of 07/24/2017.      Review of Systems  This is limited secondary to dementia provided by nursing as well.  In general no complaints of fever chills.  Skin no diaphoresis or itching noted.  Head ears eyes nose mouth and throat is not complain of visual changes or difficulty swallowing or sore throat.  Respiratory does not complain of shortness of breath or cough.  Cardiac does not complain of chest pain does have chronic lower extremity venous stasis edema   GI is not complaining of abdominal pain  or nausea-nursing has not noted vomiting or complaints of constipation.  GU is states she is not complaining of dysuria but again is a poor historian.  Musculoskeletal does not complain of joint pain.  Neurologic is not complaining of headache or syncope or dizziness.  And psych has had increased confusion with history of dementia but continues to be pleasant        Immunization History  Administered Date(s) Administered  . Influenza-Unspecified 02/04/2016, 02/03/2017  . Pneumococcal Conjugate-13 02/03/2017  . Pneumococcal-Unspecified 02/08/2016  . Tdap 01/19/2017       Pertinent  Health Maintenance Due    Topic Date Due  . URINE MICROALBUMIN  07/25/2017 (Originally 05/11/2017)  . HEMOGLOBIN A1C  11/13/2017  . FOOT EXAM  01/25/2018  . OPHTHALMOLOGY EXAM  02/22/2018  . INFLUENZA VACCINE  Completed  . DEXA SCAN  Completed  . PNA vac Low Risk Adult  Completed    Physical exam.  Temperature is 98.1 pulse 80 respirations 16 blood pressure taken manually 106/60- CBG was 177  In general this is a pleasant elderly female who appears somewhat more confused than baseline.  Her skin is warm and dry she is not diaphoretic.  Eyes pupils appear to be reactive to light sclera and conjunctive are clear visual acuity appears grossly intact.  Oropharynx is clear mucous membranes  moist.  Chest is clear to auscultation there is no labored breathing there is somewhat shallow air entry.  Heart is irregular irregular rate and rhythm without murmur gallop or rub she appears to have relatively baseline lower extremity edema compression hose are on.  Her abdomen is obese soft does not appear to be tender there are positive bowel sounds.  ---Rectal---there was a moderate amount of stool in rectal vault that was dark colored it did test positive for occult blood-- Musculoskeletal appears able to move all her extremities x4 largely ambulates in a wheelchair and was doing earlier this morning-strength appears to be baseline all 4 extremities with baseline lower extremity weakness.--Exam was somewhat limited since she was in bed  Neurologic appears grossly intact cranial nerves appear to be intact her speech is clear could not really appreciate true lateralizing findings.  Psych she is oriented to self somewhat more confused than baseline stating that she is looking for lost friends is somewhat anxious about it      LABS  June 27, 2017.      Sodium 135 potassium 4.3 BUN 24 creatinine 1.1.  June 06, 2017.  WBC 9.3 hemoglobin 12.0 platelets 233 assessment and plan.   --Assessment and  plan.  1.  Occult positive stool- will order stat blood work including a CBC with differential and CMP and await results- clinically she appears stable but does have increased confusion will await lab results  2--increased confusion-will check a TSH as well his level in addition to the above-stated lab work and monitor-also consider UA C&S will await blood work results first however. Blood sugars appear to be stable--it was 177 this morning..  She does have a history of dementia and is on Namenda  3.  Atrial fibrillation this appears rate controlled on Lopressor and digoxin again will update digoxin level she is on Xarelto again will await lab result   with occult positive bleeding will hold Xarelto at this point   ADDENDUM--- we have obtained the updated lab work hemoglobin is 10.8 which is down slightly from her recent baseline of 12-13---  Platelets are within normal range at 238 her white count is normal at 9.0.  Metabolic panel appears fairly unremarkable with a sodium of 135 potassium 4.1 BUN of 25 and creatinine of 1.13- liver function tests appear to be fairly unremarkable.  I did discuss situation and labs with Dr. Lyndel Safe via phone-Patient does appear to have significant blood in her stool per the physical exam today- I would not classify this is gross rectal bleeding nonetheless it appears to be persistent which is a concern--- we will send her to the ER for expedient evaluation of the rectal bleeding   [id number] .

## 2017-08-08 NOTE — Discharge Instructions (Addendum)
Follow up with dr. Gala Romney.   Call tomorrow and make an appointment for the end of this week or next week.  Stop taking your xarelto for 2 days

## 2017-08-08 NOTE — ED Provider Notes (Signed)
[facility] EMERGENCY DEPARTMENT Provider Note   CSN: [id number] Arrival date & time: 08/08/17  1618     History   Chief Complaint Chief Complaint  Patient presents with  . GI Bleeding    HPI BAYLEN DEA is a 77 y.o. female.  Patient states that she was told she had some bleeding from her bottom.  Patient had a heme positive stool in the nursing home  The history is provided by the patient. No language interpreter was used.  Rectal Bleeding  Quality:  Bright red Amount:  Scant Timing:  Intermittent Chronicity:  New Context: not anal fissures   Similar prior episodes: no   Relieved by:  Nothing Worsened by:  Nothing Ineffective treatments:  None tried Associated symptoms: abdominal pain   Risk factors: anticoagulant use     Past Medical History:  Diagnosis Date  . Allergy   . Atrial fibrillation ([place]) 08/2011   First diagnosed in 08/2011; duration of arrhythmia is uncertain  . Bilateral lower extremity edema   . Chronic diarrhea    diverticulosis  . COPD (chronic obstructive pulmonary disease) ([place])   . Dementia   . Diabetes mellitus, type 2 (HCC)    Diabetic neuropathy  . Dyspnea on exertion    pedal edema  . Gout   . Headache(784.0)    twice weekly  . Hyperlipidemia   . Hypertension   . Osteopenia    DEXA scan 01/2010  . Palpitations   . Seasonal allergies   . Stress incontinence   . Vertigo     Patient Active Problem List   Diagnosis Date Noted  . Bilateral leg edema 01/10/2017  . Osteoporosis 08/09/2016  . Depression 11/30/2015  . Diabetic neuropathy ([place]) 09/29/2015  . Venous stasis dermatitis 05/19/2015  . Dementia with behavioral disturbance 02/24/2015  . Chronic atrial fibrillation ([place])   . Chronic anticoagulation 09/21/2011  . Hypertension   . Hyperlipidemia   . Diabetes mellitus ([place])     Past Surgical History:  Procedure Laterality Date  . APPENDECTOMY    . BREAST BIOPSY  2002   Right  . CESAREAN SECTION     x 2  .  CHOLECYSTECTOMY    . COLONOSCOPY  2007   Negative screening study  . HAMMER TOE SURGERY     Bilateral hammer toe amputation  . INCISIONAL HERNIA REPAIR    . KNEE ARTHROSCOPY W/ MENISCAL REPAIR  2007   Bilateral  . UMBILICAL HERNIA REPAIR       OB History    Gravida  1   Para  1   Term  1   Preterm      AB      Living        SAB      TAB      Ectopic      Multiple      Live Births               Home Medications    Prior to Admission medications   Medication Sig Start Date End Date Taking? Authorizing Provider  acetaminophen (TYLENOL) 325 MG tablet Take 650 mg by mouth every 6 (six) hours as needed.    [provider]  alendronate (FOSAMAX) 70 MG tablet Give 1 tablet by mouth on Sunday. Take with a full glass of water on an empty stomach.    [provider]  carboxymethylcellulose (REFRESH PLUS) 0.5 % SOLN Place 1 drop into both eyes 4 (four) times daily.  [provider]  cholecalciferol (VITAMIN D) 1000 UNITS tablet Take 1,000 Units by mouth daily.    [provider]  citalopram (CELEXA) 20 MG tablet Take 1 tablet (20 mg total) by mouth daily. 10/06/15   Timmothy Euler, MD  digoxin (LANOXIN) 0.125 MG tablet Take 1 tablet by mouth once a day (HOLD FOR AP UNDER 60)    [provider]  docusate sodium (COLACE) 100 MG capsule Take 200 mg by mouth daily as needed.     [provider]  furosemide (LASIX) 20 MG tablet Take 60 mg by mouth.     [provider]  insulin NPH-regular Human (NOVOLIN 70/30) (70-30) 100 UNIT/ML injection Give 30 units subcutaneous once a evening    [provider]  insulin NPH-regular Human (NOVOLIN 70/30) (70-30) 100 UNIT/ML injection Give 55 units subcutaneous one a morning    [provider]  Insulin Pen Needle (EASY TOUCH PEN NEEDLES) 31G X 8 MM MISC Use twice a day    [provider]  losartan (COZAAR) 25 MG tablet Take 25 mg by mouth daily.     [provider]  memantine (NAMENDA) 10 MG tablet Take 10 mg by mouth 2 (two) times daily.    [provider]  metoprolol tartrate (LOPRESSOR) 25 MG tablet Take 75 mg by mouth 2 (two) times daily.    [provider]  Multiple Vitamin (MULTIVITAMIN WITH MINERALS) TABS Take 1 tablet by mouth every morning.    [provider]  POTASSIUM CHLORIDE ER PO Potassium chloride extended release capsule, give 20 meq by mouth once day    [provider]  pravastatin (PRAVACHOL) 20 MG tablet Take 20 mg by mouth daily. Take along with 40 mg to = 60 mg    [provider]  pravastatin (PRAVACHOL) 40 MG tablet Take 40 mg by mouth daily. Take along with 20 mg to = 60 mg    [provider]  sitaGLIPtin-metformin (JANUMET) 50-500 MG tablet Take 1 tablet by mouth 2 (two) times daily with a meal.    [provider]  XARELTO 20 MG TABS tablet TAKE (1) TABLET BY MOUTH ONCE DAILY. 10/02/15   Timmothy Euler, MD  K-DUR 20 MEQ tablet TAKE 2 TABLETS BY MOUTH ONCE DAILY. 01/20/12 05/29/13  Yehuda Savannah, MD    Family History Family History  Adopted: Yes    Social History Social History   Tobacco Use  . Smoking status: Never Smoker  . Smokeless tobacco: Never Used  Substance Use Topics  . Alcohol use: No  . Drug use: No     Allergies   Codeine; Morphine and related; Penicillins; and Ace inhibitors   Review of Systems Review of Systems  Constitutional: Negative for appetite change and fatigue.  HENT: Negative for congestion, ear discharge and sinus pressure.   Eyes: Negative for discharge.  Respiratory: Negative for cough.   Cardiovascular: Negative for chest pain.  Gastrointestinal: Positive for abdominal pain and hematochezia. Negative for diarrhea.       Rectal bleeding  Genitourinary: Negative for frequency and hematuria.  Musculoskeletal: Negative for back pain.  Skin: Negative for rash.  Neurological: Negative for seizures  and headaches.  Psychiatric/Behavioral: Negative for hallucinations.     Physical Exam Updated Vital Signs BP 106/79   Pulse 70   Temp 97.6 F (36.4 C) (Oral)   Resp 14   Wt 96.6 kg (213 lb)   SpO2 92%   BMI 34.38 kg/m  Physical Exam  Constitutional: She is oriented to person, place, and time. She appears well-developed.  HENT:  Head: Normocephalic.  Eyes: Conjunctivae and EOM are normal. No scleral icterus.  Neck: Neck supple. No thyromegaly present.  Cardiovascular: Normal rate and regular rhythm. Exam reveals no gallop and no friction rub.  No murmur heard. Pulmonary/Chest: No stridor. She has no wheezes. She has no rales. She exhibits no tenderness.  Abdominal: She exhibits no distension. There is no tenderness. There is no rebound.  Genitourinary:  Genitourinary Comments: Heme positive bright red blood from rectum  Musculoskeletal: Normal range of motion. She exhibits no edema.  Lymphadenopathy:    She has no cervical adenopathy.  Neurological: She is oriented to person, place, and time. She exhibits normal muscle tone. Coordination normal.  Skin: No rash noted. No erythema.  Psychiatric: She has a normal mood and affect. Her behavior is normal.     ED Treatments / Results  Labs (all labs ordered are listed, but only abnormal results are displayed) Labs Reviewed  CBC WITH DIFFERENTIAL/PLATELET - Abnormal; Notable for the following components:      Result Value   RBC 3.60 (*)    Hemoglobin 10.3 (*)    HCT 33.6 (*)    All other components within normal limits  BASIC METABOLIC PANEL - Abnormal; Notable for the following components:   Chloride 99 (*)    Glucose, Bld 208 (*)    BUN 26 (*)    Creatinine, Ser 1.08 (*)    GFR calc non Af Amer 48 (*)    GFR calc Af Amer 56 (*)    All other components within normal limits    EKG None  Radiology No results found.  Procedures Procedures (including critical care time)  Medications Ordered in ED Medications  - No data to display   Initial Impression / Assessment and Plan / ED Course  I have reviewed the triage vital signs and the nursing notes.  Pertinent labs & imaging results that were available during my care of the patient were reviewed by me and considered in my medical decision making (see chart for details).     Patient has had minimal rectal bleeding.  Hemoglobin is 10.3, patient was not orthostatic.  Suspect rectal bleeding is more related to Xarelto.  We will hold her Xarelto for 2 days and have her follow back up with her doctor and is referred to GI  Final Clinical Impressions(s) / ED Diagnoses   Final diagnoses:  Rectal bleeding    ED Discharge Orders    None       Milton Ferguson, MD 08/08/17 1939

## 2017-08-08 NOTE — ED Notes (Signed)
Hemocult set up at bedside

## 2017-08-09 ENCOUNTER — Inpatient Hospital Stay
Admission: RE | Admit: 2017-08-09 | Discharge: 2017-09-18 | Disposition: A | Discharge status: Nursing Home (Includes ICF) | Payer: Medicare Other | Source: Ambulatory Visit | Attending: Internal Medicine | Admitting: Internal Medicine

## 2017-08-09 ENCOUNTER — Non-Acute Institutional Stay (SKILLED_NURSING_FACILITY): Payer: Medicare Other | Admitting: Internal Medicine

## 2017-08-09 ENCOUNTER — Encounter: Payer: Self-pay | Admitting: Internal Medicine

## 2017-08-09 DIAGNOSIS — L03115 Cellulitis of right lower limb: Secondary | ICD-10-CM

## 2017-08-09 DIAGNOSIS — K625 Hemorrhage of anus and rectum: Secondary | ICD-10-CM

## 2017-08-09 DIAGNOSIS — W19XXXA Unspecified fall, initial encounter: Principal | ICD-10-CM

## 2017-08-09 DIAGNOSIS — I482 Chronic atrial fibrillation, unspecified: Secondary | ICD-10-CM

## 2017-08-09 DIAGNOSIS — T148XXA Other injury of unspecified body region, initial encounter: Secondary | ICD-10-CM

## 2017-08-09 NOTE — Progress Notes (Signed)
This is an acute visit.  Level care skilled.  Facility is [facility].  Chief complaint-acute visit follow-up rectal bleeding with an ER visit.  History of present illness.  Patient is a pleasant 77 year old female with a history of atrial fibrillation on chronic anticoagulation with Xarelto-as well as a history of type 2 diabetes venous stasis edema and dementia and history of falls actually had an ocular floor fracture in the distant past.  She has been pretty stable but had some increased confusion yesterday also was noted over the weekend  to have possibly some blood in her stool and occult blood test was positive x2.  -When I saw her yesterday I did test for blood and that was significantly positive as well and visually appear to have some blood in the stool.  We ordered lab work which showed relative stability with a hemoglobin in the mid tens- renal function appears to be stable white count was not elevated TSH has come back within normal range as well.  She was sent to the ER secondary to concerns of rectal bleeding-repeat blood work showed stability of her hemoglobin- she was thought to be stable and she returned  to the facility with recommendation to hold her Xarelto for couple days and obtain a GI consult  According to nursing her mental status is back to baseline today with less confusion which is reassuring-vital signs appear to be stable- she does not really have any complaints she is resting in bed comfortably.  Past Medical History:  Diagnosis Date  . Allergy   . Atrial fibrillation ([place]) 08/2011   First diagnosed in 08/2011; duration of arrhythmia is uncertain  . Bilateral lower extremity edema   . Chronic diarrhea    diverticulosis  . COPD (chronic obstructive pulmonary disease) ([place])   . Dementia   . Diabetes mellitus, type 2 (HCC)    Diabetic neuropathy  . Dyspnea on exertion    pedal edema  . Gout   . Headache(784.0)    twice weekly  .  Hyperlipidemia   . Hypertension   . Osteopenia    DEXA scan 01/2010  . Palpitations   . Seasonal allergies   . Stress incontinence   . Vertigo         Past Surgical History:  Procedure Laterality Date  . APPENDECTOMY    . BREAST BIOPSY  2002   Right  . CESAREAN SECTION     x 2  . CHOLECYSTECTOMY    . COLONOSCOPY  2007   Negative screening study  . HAMMER TOE SURGERY     Bilateral hammer toe amputation  . INCISIONAL HERNIA REPAIR    . KNEE ARTHROSCOPY W/ MENISCAL REPAIR  2007   Bilateral  . UMBILICAL HERNIA REPAIR          Allergies  Allergen Reactions  . Codeine Anaphylaxis and Hives  . Morphine And Related Anaphylaxis and Hives  . Penicillins Anaphylaxis  . Ace Inhibitors Cough           Medication Sig  . acetaminophen (TYLENOL) 325 MG tablet Take 650 mg by mouth every 6 (six) hours as needed.  Marland Kitchen alendronate (FOSAMAX) 70 MG tablet Give 1 tablet by mouth on Sunday. Take with a full glass of water on an empty stomach.  . carboxymethylcellulose (REFRESH PLUS) 0.5 % SOLN Place 1 drop into both eyes 4 (four) times daily.  . cholecalciferol (VITAMIN D) 1000 UNITS tablet Take 1,000 Units by mouth daily.  . citalopram (CELEXA)  20 MG tablet Take 1 tablet (20 mg total) by mouth daily.  . digoxin (LANOXIN) 0.125 MG tablet Take 1 tablet by mouth once a day (HOLD FOR AP UNDER 60)  . docusate sodium (COLACE) 100 MG capsule Take 200 mg by mouth daily as needed.   . furosemide (LASIX) 20 MG tablet Take 60 mg by mouth.   . insulin NPH-regular Human (NOVOLIN 70/30) (70-30) 100 UNIT/ML injection Give 30 units subcutaneous once a evening  . insulin NPH-regular Human (NOVOLIN 70/30) (70-30) 100 UNIT/ML injection Give 55 units subcutaneous one a morning  . Insulin Pen Needle (EASY TOUCH PEN NEEDLES) 31G X 8 MM MISC Use twice a day  . losartan (COZAAR) 25 MG tablet Take 25 mg by mouth daily.  . memantine (NAMENDA) 10 MG tablet Take 10 mg by mouth  2 (two) times daily.  . metoprolol tartrate (LOPRESSOR) 25 MG tablet Take 75 mg by mouth 2 (two) times daily.  . Multiple Vitamin (MULTIVITAMIN WITH MINERALS) TABS Take 1 tablet by mouth every morning.  Marland Kitchen POTASSIUM CHLORIDE ER PO Potassium chloride extended release capsule, give 20 meq by mouth once day  . pravastatin (PRAVACHOL) 20 MG tablet Take 20 mg by mouth daily. Take along with 40 mg to = 60 mg  . pravastatin (PRAVACHOL) 40 MG tablet Take 40 mg by mouth daily. Take along with 20 mg to = 60 mg  . sitaGLIPtin-metformin (JANUMET) 50-500 MG tablet Take 1 tablet by mouth 2 (two) times daily with a meal.  . XARELTO 20 MG TABS tablet TAKE (1) TABLET BY MOUTH ONCE DAILY.  . [DISCONTINUED] K-DUR 20 MEQ tablet TAKE 2 TABLETS BY MOUTH ONCE DAILY.    OF NOTE--Xarelto is currently on hold secondary to rectal bleeding    No facility-administered encounter medications on file as of 07/24/2017.     Review of systems-limited secondary to dementia provided by nursing as well.  In general she not complaining of fever chills resting in bed comfortably.  Her skin does not complain of rashes or itching or increased bruising.--Per some staff possibly some increased erythema of her right shin area-+  Head ears eyes nose mouth and throat no complaints of visual changes or sore throat or difficulty swallowing.  Respiratory does not complain of shortness of breath or cough.  Cardiac does not complain of chest pain has chronic stasis edema.  GI is not complaining of abdominal discomfort nausea vomiting diarrhea constipation.  Musculoskeletal is not complaining of joint pain currently.  Neurologic does not complain of being dizzy or syncopal having headaches at this point.  Psych does have some baseline dementia but is pleasantly confused does not appear overtly anxious or depressed   Physical exam.  Temperature is 98.3 pulse 82 respirations 20 blood pressure taken manually 112/68.  In  general this is a pleasant elderly female in no distress resting comfortably in bed she is bright and alert talkative which is her baseline.  Her skin is warm and dry.  On the lower right shin area there is some erythema which is somewhat warm to touch and slightly tender-there is no drainage #3- Eyes visual acuity appears intact sclera and conjunctive are clear.  Oropharynx is clear mucous membranes moist.  Chest there is no labored breathing it is clear to auscultation.  Heart is regular irregular rate and rhythm without murmur gallop or rub has her baseline venous stasis edema.  Her abdomen is obese soft nontender with positive bowel sounds.  Musculoskeletal moves all her extremities  at baseline x4- strength appears to be intact all 4 extremities when asked she sat straight up in bed without difficulty.  Neurologic is grossly intact her speech is clear cranial nerves appear to be intact no true lateralizing findings.  Psych she is oriented to self she is pleasant and appropriate appears to be more at her baseline today confused but very good humored   Labs.  August 08, 2017.  WBC 9.2 hemoglobin 10.3 platelets 241.  Sodium 137 potassium 4 BUN 26 creatinine 1.08.  Digoxin level was 0.9-.  TSH-2.8-2.  Liver function tests were within normal limits except albumin slightly low at 3.3.  Assessment and plan.  1.-  History of rectal bleeding again her stable hemoglobin is reassuring we will update this towards the end of the week to ensure stability  Also GI consult is pending currently slated and May but we will try to get this moved up if at all possible-her Xarelto currently is on hold-.  Clinically she appears stable and at baseline --confusion appears to be back at baseline secondary to dementia.  2.  Atrial fibrillation this appears rate controlled on Lopressor and digoxin-currently she is off the Xarelto temporarily will discuss with Dr. Lyndel Safe duration of this-- again  challenging situation with her rectal bleeding  #3 erythema on right shin-she does have some history of cellulitis- at this point will start doxycycline 100 mg twice daily for 7 days as well as a probiotic twice daily for 10 days-she also will be receiving Ace wrap to help with her edema  [id number]-of note greater than 25 minutes spent assessing patient-discussing her status with nursing staff- reviewing her chart labs as well as ER notes-coordinating a plan of care-of note greater than 50% of time spent coordinating plan of care with input as noted above

## 2017-08-11 ENCOUNTER — Encounter (HOSPITAL_COMMUNITY)
Admission: RE | Admit: 2017-08-11 | Discharge: 2017-08-11 | Disposition: A | Discharge status: Home / Self Care | Payer: Medicare Other | Source: Skilled Nursing Facility | Attending: Internal Medicine | Admitting: Internal Medicine

## 2017-08-11 DIAGNOSIS — M810 Age-related osteoporosis without current pathological fracture: Secondary | ICD-10-CM | POA: Diagnosis not present

## 2017-08-11 DIAGNOSIS — I5042 Chronic combined systolic (congestive) and diastolic (congestive) heart failure: Secondary | ICD-10-CM | POA: Diagnosis not present

## 2017-08-11 DIAGNOSIS — F0390 Unspecified dementia without behavioral disturbance: Secondary | ICD-10-CM | POA: Diagnosis not present

## 2017-08-11 DIAGNOSIS — F329 Major depressive disorder, single episode, unspecified: Secondary | ICD-10-CM | POA: Diagnosis not present

## 2017-08-11 DIAGNOSIS — Z7901 Long term (current) use of anticoagulants: Secondary | ICD-10-CM | POA: Insufficient documentation

## 2017-08-11 LAB — CBC WITH DIFFERENTIAL/PLATELET
BASOS PCT: 0 %
Basophils Absolute: 0 10*3/uL (ref 0.0–0.1)
EOS ABS: 0.4 10*3/uL (ref 0.0–0.7)
Eosinophils Relative: 5 %
HCT: 33.1 % — ABNORMAL LOW (ref 36.0–46.0)
Hemoglobin: 10.3 g/dL — ABNORMAL LOW (ref 12.0–15.0)
LYMPHS PCT: 21 %
Lymphs Abs: 1.6 10*3/uL (ref 0.7–4.0)
MCH: 28.8 pg (ref 26.0–34.0)
MCHC: 31.1 g/dL (ref 30.0–36.0)
MCV: 92.5 fL (ref 78.0–100.0)
Monocytes Absolute: 0.7 10*3/uL (ref 0.1–1.0)
Monocytes Relative: 10 %
Neutro Abs: 4.9 10*3/uL (ref 1.7–7.7)
Neutrophils Relative %: 64 %
Platelets: 254 10*3/uL (ref 150–400)
RBC: 3.58 MIL/uL — AB (ref 3.87–5.11)
RDW: 14.9 % (ref 11.5–15.5)
WBC: 7.5 10*3/uL (ref 4.0–10.5)

## 2017-08-11 LAB — BASIC METABOLIC PANEL
ANION GAP: 10 (ref 5–15)
BUN: 23 mg/dL — ABNORMAL HIGH (ref 6–20)
CALCIUM: 8.8 mg/dL — AB (ref 8.9–10.3)
CO2: 27 mmol/L (ref 22–32)
Chloride: 102 mmol/L (ref 101–111)
Creatinine, Ser: 0.94 mg/dL (ref 0.44–1.00)
GFR, EST NON AFRICAN AMERICAN: 57 mL/min — AB (ref >60)
Glucose, Bld: 163 mg/dL — ABNORMAL HIGH (ref 65–99)
POTASSIUM: 3.9 mmol/L (ref 3.5–5.1)
SODIUM: 139 mmol/L (ref 135–145)

## 2017-08-14 ENCOUNTER — Non-Acute Institutional Stay (SKILLED_NURSING_FACILITY): Payer: Medicare Other | Admitting: Internal Medicine

## 2017-08-14 ENCOUNTER — Encounter: Payer: Self-pay | Admitting: Internal Medicine

## 2017-08-14 DIAGNOSIS — K625 Hemorrhage of anus and rectum: Secondary | ICD-10-CM | POA: Diagnosis not present

## 2017-08-14 DIAGNOSIS — I482 Chronic atrial fibrillation, unspecified: Secondary | ICD-10-CM

## 2017-08-14 NOTE — Progress Notes (Signed)
Location:   [facility] Room Number: 113/W Place of Service:  SNF [phone]) Provider:  Jefm Bryant, MD  Patient Care Team: Celedonio Savage, MD as PCP - General (Family Medicine) Rothbart, Cristopher Estimable, MD (Cardiology) Lendon Colonel, NP as Nurse Practitioner (Nurse Practitioner)  Extended Emergency Contact Information Primary Emergency Contact: [facility] Address: [address]          [place], [address] [address] [name] of [place] Phone: [phone] Mobile Phone: [phone] Relation: Daughter  Code Status:  DNR Goals of care: Advanced Directive information Advanced Directives 08/14/2017  Does Patient Have a Medical Advance Directive? Yes  Type of Advance Directive Out of facility DNR (pink MOST or yellow form)  Does patient want to make changes to medical advance directive? No - Patient declined  Copy of [place] in Chart? No - copy requested  Pre-existing out of facility DNR order (yellow form or pink MOST form) -     Chief Complaint  Patient presents with  . Acute Visit    F/U GI Bleed    HPI:  Pt is a 77 y.o. female seen today for an acute visit for follow-up of GI bleed.  Patient has h/o Diabetes mellitus type 2 , atrial fibrillation on chronic anticoagulation, Venous stasis, Dementia with Behavior problems,  S/P fall leading to Ocular floor fracture.Cellulitis oF LE  Patient recently was seen for abdominal pain followed by bright red blood per rectum.  Her abdominal series done in the facility showed moderate constipation and she was started on MiraLAX Her hemoglobin came down from 12- to 10.3. Xarelto was discontinued Since then her bleeding stopped and patient is doing well. Her Hgb has stayed stable. She has history of dementia and is a long-term resident at the facility.  Patient unable to give any history. She says she is feeling better  nurses have not noticed any Bleeding PR for Past few  days.  Past Medical History:  Diagnosis Date  . Allergy   . Atrial fibrillation ([place]) 08/2011   First diagnosed in 08/2011; duration of arrhythmia is uncertain  . Bilateral lower extremity edema   . Chronic diarrhea    diverticulosis  . COPD (chronic obstructive pulmonary disease) ([place])   . Dementia   . Diabetes mellitus, type 2 (HCC)    Diabetic neuropathy  . Dyspnea on exertion    pedal edema  . Gout   . Headache(784.0)    twice weekly  . Hyperlipidemia   . Hypertension   . Osteopenia    DEXA scan 01/2010  . Palpitations   . Seasonal allergies   . Stress incontinence   . Vertigo    Past Surgical History:  Procedure Laterality Date  . APPENDECTOMY    . BREAST BIOPSY  2002   Right  . CESAREAN SECTION     x 2  . CHOLECYSTECTOMY    . COLONOSCOPY  2007   Negative screening study  . HAMMER TOE SURGERY     Bilateral hammer toe amputation  . INCISIONAL HERNIA REPAIR    . KNEE ARTHROSCOPY W/ MENISCAL REPAIR  2007   Bilateral  . UMBILICAL HERNIA REPAIR      Allergies  Allergen Reactions  . Codeine Anaphylaxis and Hives  . Morphine And Related Anaphylaxis and Hives  . Penicillins Anaphylaxis  . Ace Inhibitors Cough    Outpatient Encounter Medications as of 08/14/2017  Medication Sig  . acetaminophen (TYLENOL) 325 MG tablet Take 650  mg by mouth every 6 (six) hours as needed.  Marland Kitchen alendronate (FOSAMAX) 70 MG tablet Give 1 tablet by mouth on Sunday. Take with a full glass of water on an empty stomach.  Marland Kitchen aspirin EC 81 MG tablet Take 81 mg by mouth daily.  . carboxymethylcellulose (REFRESH PLUS) 0.5 % SOLN Place 1 drop into both eyes 4 (four) times daily.  . cholecalciferol (VITAMIN D) 1000 UNITS tablet Take 1,000 Units by mouth daily.  . citalopram (CELEXA) 20 MG tablet Take 1 tablet (20 mg total) by mouth daily.  . digoxin (LANOXIN) 0.125 MG tablet Take 1 tablet by mouth once a day (HOLD FOR AP UNDER 60)  . DOXYCYCLINE HYCLATE PO Take 100 mg by mouth 2 (two) times  daily. Give for 7 days from 08/09/2017-08/16/2017  . furosemide (LASIX) 20 MG tablet Take 60 mg by mouth.   . insulin NPH-regular Human (NOVOLIN 70/30) (70-30) 100 UNIT/ML injection Give 30 units subcutaneous once a evening  . insulin NPH-regular Human (NOVOLIN 70/30) (70-30) 100 UNIT/ML injection Give 55 units subcutaneous one a morning  . Insulin Pen Needle (EASY TOUCH PEN NEEDLES) 31G X 8 MM MISC Use twice a day  . losartan (COZAAR) 25 MG tablet Take 25 mg by mouth daily.  . memantine (NAMENDA) 10 MG tablet Take 10 mg by mouth 2 (two) times daily.  . metoprolol tartrate (LOPRESSOR) 25 MG tablet Take 75 mg by mouth 2 (two) times daily.  . Multiple Vitamin (MULTIVITAMIN WITH MINERALS) TABS Take 1 tablet by mouth every morning.  . polyethylene glycol (MIRALAX / GLYCOLAX) packet Take 17 g by mouth daily as needed.  Marland Kitchen POTASSIUM CHLORIDE ER PO Potassium chloride extended release capsule, give 20 meq by mouth once day  . pravastatin (PRAVACHOL) 20 MG tablet Take 20 mg by mouth daily. Take along with 40 mg to = 60 mg  . pravastatin (PRAVACHOL) 40 MG tablet Take 40 mg by mouth daily. Take along with 20 mg to = 60 mg  . Probiotic Product (RISA-BID PROBIOTIC) TABS Take 1 tablet by mouth twice a day from 08/10/2017-09/18/2017  . sitaGLIPtin-metformin (JANUMET) 50-500 MG tablet Take 1 tablet by mouth 2 (two) times daily with a meal.  . XARELTO 20 MG TABS tablet TAKE (1) TABLET BY MOUTH ONCE DAILY.  . [DISCONTINUED] docusate sodium (COLACE) 100 MG capsule Take 200 mg by mouth daily as needed.   . [DISCONTINUED] K-DUR 20 MEQ tablet TAKE 2 TABLETS BY MOUTH ONCE DAILY.   No facility-administered encounter medications on file as of 08/14/2017.      Review of Systems  Unable to perform ROS: Dementia    Immunization History  Administered Date(s) Administered  . Influenza-Unspecified 02/04/2016, 02/03/2017  . Pneumococcal Conjugate-13 02/03/2017  . Pneumococcal-Unspecified 02/08/2016  . Tdap 01/19/2017    Pertinent  Health Maintenance Due  Topic Date Due  . URINE MICROALBUMIN  09/13/2017 (Originally 05/11/2017)  . HEMOGLOBIN A1C  11/13/2017  . INFLUENZA VACCINE  11/30/2017  . FOOT EXAM  01/25/2018  . OPHTHALMOLOGY EXAM  02/22/2018  . DEXA SCAN  Completed  . PNA vac Low Risk Adult  Completed   Fall Risk  01/19/2017 11/06/2015 09/29/2015 08/31/2015 06/30/2015  Falls in the past year? No Yes No No No  Number falls in past yr: - 2 or more - - -  Injury with Fall? - No - - -   Functional Status Survey:    Vitals:   08/14/17 1033  BP: 123/78  Pulse: 84  Resp: 18  Temp: 98 F (36.7 C)  TempSrc: Oral   There is no height or weight on file to calculate BMI. Physical Exam  Constitutional: She appears well-developed and well-nourished.  HENT:  Head: Normocephalic.  Mouth/Throat: Oropharynx is clear and moist.  Eyes: Pupils are equal, round, and reactive to light.  Neck: Neck supple.  Cardiovascular: Normal rate and regular rhythm.  Pulmonary/Chest: Effort normal and breath sounds normal. She has no wheezes.  Abdominal: Soft. Bowel sounds are normal. She exhibits no distension. There is no tenderness. There is no guarding.  Rectal done was Occult Negative  Musculoskeletal:  Moderate Edema Bilateral  Lymphadenopathy:    She has no cervical adenopathy.  Neurological: She is alert.  Skin: Skin is warm and dry.  Psychiatric: She has a normal mood and affect. Her behavior is normal. Thought content normal.    Labs reviewed: Recent Labs    08/08/17 1459 08/08/17 1715 08/11/17 0730  NA 135 137 139  K 4.1 4.0 3.9  CL 98* 99* 102  CO2 25 27 27   GLUCOSE 189* 208* 163*  BUN 25* 26* 23*  CREATININE 1.13* 1.08* 0.94  CALCIUM 8.9 9.0 8.8*   Recent Labs    11/14/16 0730 08/08/17 1459  AST 15 21  ALT 15 15  ALKPHOS 48 54  BILITOT 0.7 0.5  PROT 7.2 7.5  ALBUMIN 3.2* 3.3*   Recent Labs    08/08/17 1459 08/08/17 1715 08/11/17 0730  WBC 9.0 9.2 7.5  NEUTROABS 5.9 6.3 4.9   HGB 10.8* 10.3* 10.3*  HCT 35.2* 33.6* 33.1*  MCV 92.1 93.3 92.5  PLT 238 241 254   Lab Results  Component Value Date   TSH 2.822 08/08/2017   Lab Results  Component Value Date   HGBA1C 6.4 (H) 05/16/2017   Lab Results  Component Value Date   CHOL 156 05/16/2017   HDL 38 (L) 05/16/2017   LDLCALC 104 (H) 05/16/2017   TRIG 72 05/16/2017   CHOLHDL 4.1 05/16/2017    Significant Diagnostic Results in last 30 days:  No results found.  Assessment/Plan  GI bleed and now occult negative Will continue to monitor Patient has appointment with GI in 3 more weeks Will start her on Protonix as precautionary measure though I do not think it was upper GI bleed Will discontinue Xarelto With her history of A. fib will try starting her on low-dose of Eliquis 2.5 mg twice daily and increase to 5 mg twice daily if no more episodes of bleeding Repeat CBC in 1 week   Family/ staff Communication:   Labs/tests ordered:

## 2017-08-21 ENCOUNTER — Encounter (HOSPITAL_COMMUNITY)
Admission: RE | Admit: 2017-08-21 | Discharge: 2017-08-21 | Disposition: A | Discharge status: Home / Self Care | Payer: Medicare Other | Source: Skilled Nursing Facility | Attending: Internal Medicine | Admitting: Internal Medicine

## 2017-08-21 DIAGNOSIS — E1140 Type 2 diabetes mellitus with diabetic neuropathy, unspecified: Secondary | ICD-10-CM | POA: Diagnosis not present

## 2017-08-21 LAB — CBC WITH DIFFERENTIAL/PLATELET
BASOS PCT: 0 %
Basophils Absolute: 0 10*3/uL (ref 0.0–0.1)
EOS ABS: 0.8 10*3/uL — AB (ref 0.0–0.7)
Eosinophils Relative: 9 %
HEMATOCRIT: 36.1 % (ref 36.0–46.0)
HEMOGLOBIN: 10.8 g/dL — AB (ref 12.0–15.0)
Lymphocytes Relative: 25 %
Lymphs Abs: 2.2 10*3/uL (ref 0.7–4.0)
MCH: 28.2 pg (ref 26.0–34.0)
MCHC: 29.9 g/dL — ABNORMAL LOW (ref 30.0–36.0)
MCV: 94.3 fL (ref 78.0–100.0)
MONOS PCT: 11 %
Monocytes Absolute: 1 10*3/uL (ref 0.1–1.0)
NEUTROS ABS: 4.8 10*3/uL (ref 1.7–7.7)
Neutrophils Relative %: 55 %
Platelets: 242 10*3/uL (ref 150–400)
RBC: 3.83 MIL/uL — ABNORMAL LOW (ref 3.87–5.11)
RDW: 15 % (ref 11.5–15.5)
WBC: 8.7 10*3/uL (ref 4.0–10.5)

## 2017-08-22 DIAGNOSIS — Z7984 Long term (current) use of oral hypoglycemic drugs: Secondary | ICD-10-CM | POA: Diagnosis not present

## 2017-08-22 DIAGNOSIS — H04123 Dry eye syndrome of bilateral lacrimal glands: Secondary | ICD-10-CM | POA: Diagnosis not present

## 2017-08-22 DIAGNOSIS — Z794 Long term (current) use of insulin: Secondary | ICD-10-CM | POA: Diagnosis not present

## 2017-08-22 DIAGNOSIS — E113293 Type 2 diabetes mellitus with mild nonproliferative diabetic retinopathy without macular edema, bilateral: Secondary | ICD-10-CM | POA: Diagnosis not present

## 2017-08-24 ENCOUNTER — Non-Acute Institutional Stay (SKILLED_NURSING_FACILITY): Payer: Medicare Other | Admitting: Internal Medicine

## 2017-08-24 ENCOUNTER — Encounter: Payer: Self-pay | Admitting: Internal Medicine

## 2017-08-24 DIAGNOSIS — I482 Chronic atrial fibrillation, unspecified: Secondary | ICD-10-CM

## 2017-08-24 DIAGNOSIS — I10 Essential (primary) hypertension: Secondary | ICD-10-CM

## 2017-08-24 DIAGNOSIS — Z794 Long term (current) use of insulin: Secondary | ICD-10-CM | POA: Diagnosis not present

## 2017-08-24 DIAGNOSIS — I872 Venous insufficiency (chronic) (peripheral): Secondary | ICD-10-CM

## 2017-08-24 DIAGNOSIS — E1140 Type 2 diabetes mellitus with diabetic neuropathy, unspecified: Secondary | ICD-10-CM

## 2017-08-24 DIAGNOSIS — K625 Hemorrhage of anus and rectum: Secondary | ICD-10-CM | POA: Diagnosis not present

## 2017-08-24 DIAGNOSIS — F0391 Unspecified dementia with behavioral disturbance: Secondary | ICD-10-CM | POA: Diagnosis not present

## 2017-08-24 NOTE — Progress Notes (Signed)
Location:   [facility] Room Number: 113/W Place of Service:  SNF [id number]) Provider:  Dionicia Abler, MD  Patient Care Team: Celedonio Savage, MD as PCP - General (Family Medicine) Rothbart, Cristopher Estimable, MD (Cardiology) Lendon Colonel, NP as Nurse Practitioner (Nurse Practitioner)  Extended Emergency Contact Information Primary Emergency Contact: [facility] Address: [address]          [place], [place] [address] [name] of [place] Phone: [phone] Mobile Phone: [phone] Relation: Daughter  Code Status:  DNR Goals of care: Advanced Directive information Advanced Directives 08/14/2017  Does Patient Have a Medical Advance Directive? Yes  Type of Advance Directive Out of facility DNR (pink MOST or yellow form)  Does patient want to make changes to medical advance directive? No - Patient declined  Copy of [place] in Chart? No - copy requested  Pre-existing out of facility DNR order (yellow form or pink MOST form) -     Chief Complaint  Patient presents with  . Medical Management of Chronic Issues    Routine Visit  For medical management of chronic medical conditions including type 2 diabetes-atrial fibrillation-venous stasis edema-dementia- hypertension- recent history of rectal bleeding- constipation-osteoporosis.    HPI:  Pt is a 77 y.o. female seen today for medical management of chronic diseases.  As noted above.  Most recent acute issue was some abdominal pain followed by bright red blood from her rectum.  Abdominal x-rays in the facility showed moderate constipation and she was started on MiraLAX.  Her hemoglobin was noted to have come down slightly from 12 down to 10.3- Xarelto was discontinued.  Her bleeding is stopped and she is done well hemoglobin has stayed stable.  She has been started on low-dose Eliquis and a GI consult is pending she is on anticoagulation for her history of atrial  fibrillation.  In regard to her other medical issues she does have type 2 diabetes and is on Janumet as well as Novolin 70/30 twice daily- blood sugars appear to be relatively stable in the lower 100s to mid 100s generally in the morning-somewhat more variable later in the day ranging from the lower 100s to higher 100s generally occasionally a spike over 200 but this is not common.  Hemoglobin A1c in January was 6.4.  In regards to A. fib again she is on the Eliquis and we are considering increasing her Eliquis to 5 twice daily since her hemoglobin has been stable she is on Lopressor and digoxin as well for rate control.  She does have venous stasis edema which is significant but stable on the Lasix and potassium.  Her dementia she is pleasantly confused and cooperative- she is on Namenda.  She also has a history of hypertension which appears to be controlled on Lopressor and Cozaar recent blood pressures 117/75-127/61- 112/80.  Regards osteoporosis she is on Fosamax.  She also has a distant history of an ocular floor fracture after a fall but has made a nice recovery from this-she occasionally will try to get up however and walki-- staff has been quite adamant that she needs to call for help before attempting to ambulate although this continues to be somewhat of a challenge  Currently she is sitting in her wheelchair comfortably has no complaints continues to be pleasant somewhat confused but cooperative and in good  spirits   Past Medical History:  Diagnosis Date  . Allergy   . Atrial fibrillation ([place]) 08/2011  First diagnosed in 08/2011; duration of arrhythmia is uncertain  . Bilateral lower extremity edema   . Chronic diarrhea    diverticulosis  . COPD (chronic obstructive pulmonary disease) ([place])   . Dementia   . Diabetes mellitus, type 2 (HCC)    Diabetic neuropathy  . Dyspnea on exertion    pedal edema  . Gout   . Headache(784.0)    twice weekly  . Hyperlipidemia   .  Hypertension   . Osteopenia    DEXA scan 01/2010  . Palpitations   . Seasonal allergies   . Stress incontinence   . Vertigo    Past Surgical History:  Procedure Laterality Date  . APPENDECTOMY    . BREAST BIOPSY  2002   Right  . CESAREAN SECTION     x 2  . CHOLECYSTECTOMY    . COLONOSCOPY  2007   Negative screening study  . HAMMER TOE SURGERY     Bilateral hammer toe amputation  . INCISIONAL HERNIA REPAIR    . KNEE ARTHROSCOPY W/ MENISCAL REPAIR  2007   Bilateral  . UMBILICAL HERNIA REPAIR      Allergies  Allergen Reactions  . Codeine Anaphylaxis and Hives  . Morphine And Related Anaphylaxis and Hives  . Penicillins Anaphylaxis  . Ace Inhibitors Cough    Outpatient Encounter Medications as of 08/24/2017  Medication Sig  . acetaminophen (TYLENOL) 325 MG tablet Take 650 mg by mouth every 6 (six) hours as needed.  Marland Kitchen alendronate (FOSAMAX) 70 MG tablet Give 1 tablet by mouth on Sunday. Take with a full glass of water on an empty stomach.  Marland Kitchen apixaban (ELIQUIS) 2.5 MG TABS tablet Take 2.5 mg by mouth 2 (two) times daily.  . carboxymethylcellulose (REFRESH PLUS) 0.5 % SOLN Place 1 drop into both eyes 4 (four) times daily.  . cholecalciferol (VITAMIN D) 1000 UNITS tablet Take 1,000 Units by mouth daily.  . citalopram (CELEXA) 20 MG tablet Take 1 tablet (20 mg total) by mouth daily.  . digoxin (LANOXIN) 0.125 MG tablet Take 1 tablet by mouth once a day (HOLD FOR AP UNDER 60)  . furosemide (LASIX) 20 MG tablet Take 60 mg by mouth.   . insulin NPH-regular Human (NOVOLIN 70/30) (70-30) 100 UNIT/ML injection Give 30 units subcutaneous once a evening  . insulin NPH-regular Human (NOVOLIN 70/30) (70-30) 100 UNIT/ML injection Give 55 units subcutaneous one a morning  . Insulin Pen Needle (EASY TOUCH PEN NEEDLES) 31G X 8 MM MISC Use twice a day  . losartan (COZAAR) 25 MG tablet Take 25 mg by mouth daily.  . memantine (NAMENDA) 10 MG tablet Take 10 mg by mouth 2 (two) times daily.  .  metoprolol tartrate (LOPRESSOR) 25 MG tablet Take 75 mg by mouth 2 (two) times daily.  . Multiple Vitamin (MULTIVITAMIN WITH MINERALS) TABS Take 1 tablet by mouth every morning.  . pantoprazole (PROTONIX) 40 MG tablet Take 40 mg by mouth daily.  . polyethylene glycol (MIRALAX / GLYCOLAX) packet Take 17 g by mouth daily as needed.  Marland Kitchen POTASSIUM CHLORIDE ER PO Potassium chloride extended release capsule, give 20 meq by mouth once day  . pravastatin (PRAVACHOL) 20 MG tablet Take 20 mg by mouth daily. Take along with 40 mg to = 60 mg  . pravastatin (PRAVACHOL) 40 MG tablet Take 40 mg by mouth daily. Take along with 20 mg to = 60 mg  . sitaGLIPtin-metformin (JANUMET) 50-500 MG tablet Take 1 tablet by mouth 2 (two) times daily  with a meal.  . [DISCONTINUED] aspirin EC 81 MG tablet Take 81 mg by mouth daily.  . [DISCONTINUED] DOXYCYCLINE HYCLATE PO Take 100 mg by mouth 2 (two) times daily. Give for 7 days from 08/09/2017-08/16/2017  . [DISCONTINUED] K-DUR 20 MEQ tablet TAKE 2 TABLETS BY MOUTH ONCE DAILY.  . [DISCONTINUED] Probiotic Product (RISA-BID PROBIOTIC) TABS Take 1 tablet by mouth twice a day from 08/10/2017-09/18/2017  . [DISCONTINUED] XARELTO 20 MG TABS tablet TAKE (1) TABLET BY MOUTH ONCE DAILY.   No facility-administered encounter medications on file as of 08/24/2017.      Review of Systems--limited secondary to dementia provided by nursing as well.  General is not complaining of any fever chills her weight appears to be relatively stable.  Skin does not complain of rashes itching or diaphoresis-at times will develop some cellulitis of her lower extremities but this appears to be stable currently.  Head ears eyes nose mouth and throat is not complaining of sore throat visual changes or difficulty swallowing.  Respiratory is not complaining of shortness of breath or cough.  Cardiac does not complain of chest pain has chronic venous stasis edema.  GI does not complain of abdominal  discomfort nausea vomiting diarrhea constipation- apparently rectal bleeding is largely resolved occasionally possibly she may have a small blood smear in her diaper per nursing.  GU does not complain of dysuria or burning.  Musculoskeletal does not complain of joint pain.  Neurologic does not complain of feeling syncopal dizzy having headaches or any dizziness.  Psych continues with baseline dementia but does well with supportive care.  She does not appear overtly anxious or depressed today  Immunization History  Administered Date(s) Administered  . Influenza-Unspecified 02/04/2016, 02/03/2017  . Pneumococcal Conjugate-13 02/03/2017  . Pneumococcal-Unspecified 02/08/2016  . Tdap 01/19/2017   Pertinent  Health Maintenance Due  Topic Date Due  . URINE MICROALBUMIN  09/13/2017 (Originally 05/11/2017)  . HEMOGLOBIN A1C  11/13/2017  . INFLUENZA VACCINE  11/30/2017  . FOOT EXAM  01/25/2018  . OPHTHALMOLOGY EXAM  02/22/2018  . DEXA SCAN  Completed  . PNA vac Low Risk Adult  Completed   Fall Risk  01/19/2017 11/06/2015 09/29/2015 08/31/2015 06/30/2015  Falls in the past year? No Yes No No No  Number falls in past yr: - 2 or more - - -  Injury with Fall? - No - - -   Functional Status Survey:    Vitals:   08/24/17 1559  BP: 117/75  Pulse: 79  Resp: 20  Temp: 98.6 F (37 C)  TempSrc: Oral  SpO2: 97%  Weight: 216 lb 6.4 oz (98.2 kg)  Height: 5\' 6"  (1.676 m)   Body mass index is 34.93 kg/m. Physical Exam   In general this is a pleasant elderly female in no distress.  Her skin is warm and dry- she appears to have chronic venous stasis edema and some pale erythema which does not appear to be cellulitic. Eyes visual acuity appears grossly intact sclera and conjunctive are clear.  Oropharynx is clear mucous membranes moist.  Chest is clear to auscultation there is no labored breathing.  Heart is irregular irregular rate and rhythm without murmur gallop or rub she has baseline  venous stasis edema as noted above.  Abdomen is obese soft nontender with positive bowel sounds.  Musculoskeletal moves all her extremities at baseline with baseline strength- she is able to stand without assistance and ambulate but is very unsteady which is baseline  Neurologic is grossly intact  her speech is clear cranial nerves appear to be intact could not really appreciate lateralizing findings.  Psych she is oriented to self very pleasant and appropriate response easily to simple verbal commands continues to be in good humor Labs reviewed: Recent Labs    08/08/17 1459 08/08/17 1715 08/11/17 0730  NA 135 137 139  K 4.1 4.0 3.9  CL 98* 99* 102  CO2 25 27 27   GLUCOSE 189* 208* 163*  BUN 25* 26* 23*  CREATININE 1.13* 1.08* 0.94  CALCIUM 8.9 9.0 8.8*   Recent Labs    11/14/16 0730 08/08/17 1459  AST 15 21  ALT 15 15  ALKPHOS 48 54  BILITOT 0.7 0.5  PROT 7.2 7.5  ALBUMIN 3.2* 3.3*   Recent Labs    08/08/17 1715 08/11/17 0730 08/21/17 0330  WBC 9.2 7.5 8.7  NEUTROABS 6.3 4.9 4.8  HGB 10.3* 10.3* 10.8*  HCT 33.6* 33.1* 36.1  MCV 93.3 92.5 94.3  PLT 241 254 242   Lab Results  Component Value Date   TSH 2.822 08/08/2017   Lab Results  Component Value Date   HGBA1C 6.4 (H) 05/16/2017   Lab Results  Component Value Date   CHOL 156 05/16/2017   HDL 38 (L) 05/16/2017   LDLCALC 104 (H) 05/16/2017   TRIG 72 05/16/2017   CHOLHDL 4.1 05/16/2017    Significant Diagnostic Results in last 30 days:  No results found.  Assessment/Plan  #1- history of rectal bleeding again a GI consult is pending her hemoglobin has been stable update CBC is pending- clinically she appears to be quite stable-per discussion with Dr. Lyndel Safe will increase her Eliquis to 5 mg twice daily and continue to monitor her hemoglobin and clinical status.  She is also on a proton pump inhibitor  2.  Atrial fibrillation this appears rate controlled on Lopressor as well as digoxin she is on  Eliquis for anticoagulation as noted above. --Per review of labs digoxin level was 0.9 earlier this month  3.  Diabetes type 2 this appears relatively stable as well on Janumet as well as Novolin 70/30 twice daily-hemoglobin A1c in January was 6.4.--CBGs are largely under 200  4.  History of venous stasis edema this appears relatively well controlled on Lasix with potassium supplementation- weight appears relatively stable- renal function appear to be stable on lab done on April 12 with a creatinine of 0.94 potassium was 3.9 sodium 139.  5.  History of dementia this appears stable she does well with supportive care she is on Namenda her weight has been relatively stable.  6.  History of hypertension this appears stable as noted above on Lopressor as well as losartan.  7.  History of constipation this appears to be stabilized on the MiraLAX.  8.  History of osteoporosis she has been started on Fosamax and appears to be tolerating this well.  9.  History of hyperlipidemia she is on a statin--LDL was 104 on lab done in January and Pravachol was increased now on 60 mg a day.   #10 history of ocular for fracture after a fall-she appears to have made a nice recovery from this-.  11 history depression this appears stable on Celexa again she continues to be bright alert in good spirits  [id number]-of note greater than 35 minutes spent assessing patient past discussing her status with nursing staff reviewing her chart labs- coordinating and formulating a plan of care for numerous diagnoses- of note greater than 50% of time spent coordinating  plan of care

## 2017-09-03 DIAGNOSIS — W19XXXA Unspecified fall, initial encounter: Secondary | ICD-10-CM | POA: Diagnosis not present

## 2017-09-03 DIAGNOSIS — M79671 Pain in right foot: Secondary | ICD-10-CM | POA: Diagnosis not present

## 2017-09-03 DIAGNOSIS — M25561 Pain in right knee: Secondary | ICD-10-CM | POA: Diagnosis not present

## 2017-09-03 DIAGNOSIS — M79661 Pain in right lower leg: Secondary | ICD-10-CM | POA: Diagnosis not present

## 2017-09-03 DIAGNOSIS — M25571 Pain in right ankle and joints of right foot: Secondary | ICD-10-CM | POA: Diagnosis not present

## 2017-09-04 ENCOUNTER — Encounter (HOSPITAL_COMMUNITY)
Admission: RE | Admit: 2017-09-04 | Discharge: 2017-09-04 | Disposition: A | Discharge status: Home / Self Care | Payer: Medicare Other | Source: Skilled Nursing Facility | Attending: Internal Medicine | Admitting: Internal Medicine

## 2017-09-04 ENCOUNTER — Ambulatory Visit (HOSPITAL_COMMUNITY): Payer: Medicare Other | Attending: Internal Medicine

## 2017-09-04 ENCOUNTER — Encounter: Payer: Self-pay | Admitting: Internal Medicine

## 2017-09-04 ENCOUNTER — Non-Acute Institutional Stay (SKILLED_NURSING_FACILITY): Payer: Medicare Other | Admitting: Internal Medicine

## 2017-09-04 DIAGNOSIS — I482 Chronic atrial fibrillation, unspecified: Secondary | ICD-10-CM

## 2017-09-04 DIAGNOSIS — M85871 Other specified disorders of bone density and structure, right ankle and foot: Secondary | ICD-10-CM | POA: Insufficient documentation

## 2017-09-04 DIAGNOSIS — M7989 Other specified soft tissue disorders: Secondary | ICD-10-CM | POA: Insufficient documentation

## 2017-09-04 DIAGNOSIS — K625 Hemorrhage of anus and rectum: Secondary | ICD-10-CM | POA: Diagnosis not present

## 2017-09-04 DIAGNOSIS — M79671 Pain in right foot: Secondary | ICD-10-CM | POA: Diagnosis not present

## 2017-09-04 DIAGNOSIS — E1140 Type 2 diabetes mellitus with diabetic neuropathy, unspecified: Secondary | ICD-10-CM | POA: Insufficient documentation

## 2017-09-04 DIAGNOSIS — M11261 Other chondrocalcinosis, right knee: Secondary | ICD-10-CM | POA: Diagnosis not present

## 2017-09-04 DIAGNOSIS — M2391 Unspecified internal derangement of right knee: Secondary | ICD-10-CM | POA: Diagnosis not present

## 2017-09-04 DIAGNOSIS — S79921A Unspecified injury of right thigh, initial encounter: Secondary | ICD-10-CM | POA: Diagnosis not present

## 2017-09-04 DIAGNOSIS — M79604 Pain in right leg: Secondary | ICD-10-CM | POA: Insufficient documentation

## 2017-09-04 DIAGNOSIS — S99921A Unspecified injury of right foot, initial encounter: Secondary | ICD-10-CM | POA: Diagnosis not present

## 2017-09-04 DIAGNOSIS — S8991XA Unspecified injury of right lower leg, initial encounter: Secondary | ICD-10-CM | POA: Diagnosis not present

## 2017-09-04 DIAGNOSIS — M25561 Pain in right knee: Secondary | ICD-10-CM | POA: Diagnosis not present

## 2017-09-04 DIAGNOSIS — M79651 Pain in right thigh: Secondary | ICD-10-CM | POA: Diagnosis not present

## 2017-09-04 DIAGNOSIS — W19XXXA Unspecified fall, initial encounter: Secondary | ICD-10-CM | POA: Diagnosis not present

## 2017-09-04 DIAGNOSIS — M249 Joint derangement, unspecified: Secondary | ICD-10-CM | POA: Diagnosis not present

## 2017-09-04 LAB — CBC
HCT: 33.3 % — ABNORMAL LOW (ref 36.0–46.0)
Hemoglobin: 10.2 g/dL — ABNORMAL LOW (ref 12.0–15.0)
MCH: 28.1 pg (ref 26.0–34.0)
MCHC: 30.6 g/dL (ref 30.0–36.0)
MCV: 91.7 fL (ref 78.0–100.0)
PLATELETS: 222 10*3/uL (ref 150–400)
RBC: 3.63 MIL/uL — AB (ref 3.87–5.11)
RDW: 14.5 % (ref 11.5–15.5)
WBC: 10 10*3/uL (ref 4.0–10.5)

## 2017-09-04 NOTE — Progress Notes (Signed)
Location:   [facility] Room Number: 113/W Place of Service:  SNF [id number]) Provider:  Dionicia Abler, MD  Patient Care Team: Celedonio Savage, MD as PCP - General (Family Medicine) Rothbart, Cristopher Estimable, MD (Cardiology) Lendon Colonel, NP as Nurse Practitioner (Nurse Practitioner)  Extended Emergency Contact Information Primary Emergency Contact: [facility] Address: [address]          [place], [place] [address] [name] of [place] Phone: [phone] Mobile Phone: [phone] Relation: Daughter  Code Status:  DNR Goals of care: Advanced Directive information Advanced Directives 09/04/2017  Does Patient Have a Medical Advance Directive? Yes  Type of Advance Directive Out of facility DNR (pink MOST or yellow form)  Does patient want to make changes to medical advance directive? No - Patient declined  Copy of [place] in Chart? No - copy requested  Pre-existing out of facility DNR order (yellow form or pink MOST form) -     Chief complaint-acute visit follow-up right lower leg-foot discomfort after a fall  HPI:  Pt is a 77 y.o. female seen today for an acute visit for follow-up of x-rays obtained after a fall yesterday and facility-.  She also has a history of type 2 diabetes as well as atrial fibrillation venous stasis edema dementia hypertension recent history of rectal bleeding GI consult is pending as well as constipation and osteoporosis  Patient apparently fell on her buttock in her room yesterday-with no apparent injury however she did at one point complaining of some right ankle and lower leg and foot discomfort-x-rays were taken by mobile service which did not really show any  definate acute changes it did show a smal plantar spur--as well as questionabl e flattening of her Boehler's angle--questionable concerns for a calcaneal fracture  Today however she is denying any discomfort.  Vital signs appear to be  stable.     Past Medical History:  Diagnosis Date  . Allergy   . Atrial fibrillation ([place]) 08/2011   First diagnosed in 08/2011; duration of arrhythmia is uncertain  . Bilateral lower extremity edema   . Chronic diarrhea    diverticulosis  . COPD (chronic obstructive pulmonary disease) ([place])   . Dementia   . Diabetes mellitus, type 2 (HCC)    Diabetic neuropathy  . Dyspnea on exertion    pedal edema  . Gout   . Headache(784.0)    twice weekly  . Hyperlipidemia   . Hypertension   . Osteopenia    DEXA scan 01/2010  . Palpitations   . Seasonal allergies   . Stress incontinence   . Vertigo    Past Surgical History:  Procedure Laterality Date  . APPENDECTOMY    . BREAST BIOPSY  2002   Right  . CESAREAN SECTION     x 2  . CHOLECYSTECTOMY    . COLONOSCOPY  2007   Negative screening study  . HAMMER TOE SURGERY     Bilateral hammer toe amputation  . INCISIONAL HERNIA REPAIR    . KNEE ARTHROSCOPY W/ MENISCAL REPAIR  2007   Bilateral  . UMBILICAL HERNIA REPAIR      Allergies  Allergen Reactions  . Codeine Anaphylaxis and Hives  . Morphine And Related Anaphylaxis and Hives  . Penicillins Anaphylaxis  . Ace Inhibitors Cough    Outpatient Encounter Medications as of 09/04/2017  Medication Sig  . acetaminophen (TYLENOL) 325 MG tablet Take 650 mg by mouth every 6 (six) hours  as needed.  Marland Kitchen alendronate (FOSAMAX) 70 MG tablet Give 1 tablet by mouth on Sunday. Take with a full glass of water on an empty stomach.  Marland Kitchen apixaban (ELIQUIS) 2.5 MG TABS tablet Take 2.5 mg by mouth 2 (two) times daily.  . carboxymethylcellulose (REFRESH PLUS) 0.5 % SOLN Place 1 drop into both eyes 4 (four) times daily.  . cholecalciferol (VITAMIN D) 1000 UNITS tablet Take 1,000 Units by mouth daily.  . citalopram (CELEXA) 20 MG tablet Take 1 tablet (20 mg total) by mouth daily.  . digoxin (LANOXIN) 0.125 MG tablet Take 1 tablet by mouth once a day (HOLD FOR AP UNDER 60)  . furosemide (LASIX) 20 MG  tablet Take 60 mg by mouth.   . insulin NPH-regular Human (NOVOLIN 70/30) (70-30) 100 UNIT/ML injection Give 30 units subcutaneous once a evening  . insulin NPH-regular Human (NOVOLIN 70/30) (70-30) 100 UNIT/ML injection Give 55 units subcutaneous one a morning  . Insulin Pen Needle (EASY TOUCH PEN NEEDLES) 31G X 8 MM MISC Use twice a day  . losartan (COZAAR) 25 MG tablet Take 25 mg by mouth daily.  . memantine (NAMENDA) 10 MG tablet Take 10 mg by mouth 2 (two) times daily.  . metoprolol tartrate (LOPRESSOR) 25 MG tablet Take 75 mg by mouth 2 (two) times daily.  . Multiple Vitamin (MULTIVITAMIN WITH MINERALS) TABS Take 1 tablet by mouth every morning.  . pantoprazole (PROTONIX) 40 MG tablet Take 40 mg by mouth daily.  . polyethylene glycol (MIRALAX / GLYCOLAX) packet Take 17 g by mouth daily as needed.  Marland Kitchen POTASSIUM CHLORIDE ER PO Potassium chloride extended release capsule, give 20 meq by mouth once day  . pravastatin (PRAVACHOL) 20 MG tablet Take 20 mg by mouth daily. Take along with 40 mg to = 60 mg  . pravastatin (PRAVACHOL) 40 MG tablet Take 40 mg by mouth daily. Take along with 20 mg to = 60 mg  . sitaGLIPtin-metformin (JANUMET) 50-500 MG tablet Take 1 tablet by mouth 2 (two) times daily with a meal.  . [DISCONTINUED] K-DUR 20 MEQ tablet TAKE 2 TABLETS BY MOUTH ONCE DAILY.   No facility-administered encounter medications on file as of 09/04/2017.     Review of Systems   This is limited secondary to dementia provided by nursing as well.  In general she is not complaining of fever or chills.  Skin does not complain of diaphoresis rashes or itching or bruising.  Head ears eyes nose mouth and throat is not complaining of any sore throat or visual changes.  Respiratory denies shortness of breath  or cough.  Cardiac is not complaining of chest pain--has baseline lower extremity edema.  GI is not complaining of abdominal discomfort or nursing she ate very well this noon.  GU does not  complain of burning with urination.  Musculoskeletal is not really complaining of any leg or joint pain today.  Neurologic does not complain of dizziness headache or numbness or syncope.  Psych does have dementia but appears to be at baseline pleasant cooperative no behavior issues although at times she will get up without asking for assistance     Immunization History  Administered Date(s) Administered  . Influenza-Unspecified 02/04/2016, 02/03/2017  . Pneumococcal Conjugate-13 02/03/2017  . Pneumococcal-Unspecified 02/08/2016  . Tdap 01/19/2017   Pertinent  Health Maintenance Due  Topic Date Due  . URINE MICROALBUMIN  09/13/2017 (Originally 05/11/2017)  . HEMOGLOBIN A1C  11/13/2017  . INFLUENZA VACCINE  11/30/2017  . FOOT EXAM  01/25/2018  . OPHTHALMOLOGY EXAM  02/22/2018  . DEXA SCAN  Completed  . PNA vac Low Risk Adult  Completed   Fall Risk  01/19/2017 11/06/2015 09/29/2015 08/31/2015 06/30/2015  Falls in the past year? No Yes No No No  Number falls in past yr: - 2 or more - - -  Injury with Fall? - No - - -   Functional Status Survey:    Vitals:   09/04/17 1303  BP: 114/66  Pulse: 82  Resp: 18  Temp: 98.3 F (36.8 C)  TempSrc: Oral  SpO2: 95%     Physical Exam   In general this is a pleasant elderly female she is somewhat confused which is her baseline.  Her skin is warm and dry do not note any increased bruising-she does have chronic venous stasis edema pale erythema lower extremities which is baseline.  Eyes visual acuity appears to be intact pupils are equal round reactive light sclera and conjunctive are clear.  Oropharynx clear mucous membranes moist.  Chest is clear to auscultation there is no labored breathing.  Heart is irregular irregular rate and rhythm without murmur gallop or rub she has baseline venous stasis edema.  Abdomen is obese soft nontender with positive bowel sounds.  Musculoskeletal is able to move all extremities x4 at baseline  with lower extremity weakness- she does not complain of pain with palpation of the ankle area on the right-pedal pulses intact- complains of some minimal discomfort when her toes are palpated.  Neurologic is grossly intact her speech is clear no lateralizing findings.  Psych she is oriented to self continues to be pleasant follow simple verbal commands  Labs reviewed: Recent Labs    08/08/17 1459 08/08/17 1715 08/11/17 0730  NA 135 137 139  K 4.1 4.0 3.9  CL 98* 99* 102  CO2 25 27 27   GLUCOSE 189* 208* 163*  BUN 25* 26* 23*  CREATININE 1.13* 1.08* 0.94  CALCIUM 8.9 9.0 8.8*   Recent Labs    11/14/16 0730 08/08/17 1459  AST 15 21  ALT 15 15  ALKPHOS 48 54  BILITOT 0.7 0.5  PROT 7.2 7.5  ALBUMIN 3.2* 3.3*   Recent Labs    08/08/17 1715 08/11/17 0730 08/21/17 0330 09/04/17 0612  WBC 9.2 7.5 8.7 10.0  NEUTROABS 6.3 4.9 4.8  --   HGB 10.3* 10.3* 10.8* 10.2*  HCT 33.6* 33.1* 36.1 33.3*  MCV 93.3 92.5 94.3 91.7  PLT 241 254 242 222   Lab Results  Component Value Date   TSH 2.822 08/08/2017   Lab Results  Component Value Date   HGBA1C 6.4 (H) 05/16/2017   Lab Results  Component Value Date   CHOL 156 05/16/2017   HDL 38 (L) 05/16/2017   LDLCALC 104 (H) 05/16/2017   TRIG 72 05/16/2017   CHOLHDL 4.1 05/16/2017    Significant Diagnostic Results in last 30 days:  No results found.  Assessment/Plan  #1 fall with x-ray showing no definite acute changes but did show question calcaneal fracture- this was discussed with Dr. Lyndel Safe and will obtain a fixed site x-ray  at the hospital and try to keep weight off right foot until further notice.  Currently she does not appear to be symptomatic here with no complaints of right foot discomfort except for some mild toe palpation discomfort which may be more the result of the invasive maneuver--  Again will await x-ray results.   -#2 history of rectal bleeding-GI consult is pending hemoglobin has  shown stability was 10.2  on lab done today Eliquis has been increased up to 5 mg twice a day secondary to her stability of hemoglobin-she also is  proton pump.  3.  History of atrial fibrillation again her Eliquis is now been increased to 5 mg twice daily-  is on Lopressor for rate control which appears to be controlled    [id number]

## 2017-09-11 ENCOUNTER — Encounter: Payer: Self-pay | Admitting: Internal Medicine

## 2017-09-11 ENCOUNTER — Non-Acute Institutional Stay (SKILLED_NURSING_FACILITY): Payer: Medicare Other | Admitting: Internal Medicine

## 2017-09-11 DIAGNOSIS — K625 Hemorrhage of anus and rectum: Secondary | ICD-10-CM

## 2017-09-11 DIAGNOSIS — L03115 Cellulitis of right lower limb: Secondary | ICD-10-CM | POA: Diagnosis not present

## 2017-09-11 NOTE — Progress Notes (Signed)
Location:   [facility] Room Number: 113/W Place of Service:  SNF (31) Provider:  Jefm Bryant, MD  Patient Care Team: Celedonio Savage, MD as PCP - General (Family Medicine) Rothbart, Cristopher Estimable, MD (Cardiology) Lendon Colonel, NP as Nurse Practitioner (Nurse Practitioner)  Extended Emergency Contact Information Primary Emergency Contact: [facility] Address: [address]          [place], [place] [address] [name] of [place] Phone: [phone] Mobile Phone: [phone] Relation: Daughter  Code Status:  DNR Goals of care: Advanced Directive information Advanced Directives 09/11/2017  Does Patient Have a Medical Advance Directive? Yes  Type of Advance Directive Out of facility DNR (pink MOST or yellow form)  Does patient want to make changes to medical advance directive? -  Copy of [place] in Chart? No - copy requested  Pre-existing out of facility DNR order (yellow form or pink MOST form) -     Chief Complaint  Patient presents with  . Acute Visit    Patients c/o Right lower leg redness    HPI:  Pt is a 77 y.o. female seen today for an acute visit for Redness in Right Leg with Possible Cellulitis  Patient has h/o Diabetes mellitus type 2 , atrial fibrillation on chronic anticoagulation, Venous stasis, Dementia with Behavior problems, S/P fall leading to Ocular floor fracture. GI bleed.  Patient has fell  2 weeks ago on the Right side and had multiple Xrays which were negative for any Fractures.. But Nurses have noticed her to be c/o Pain and redness in that leg. She is walking on that leg but c/o Pain when puts weight  Patient denies any Fever or chills. Patient also had had Bright Red Blood on 04/09. Her Pradaxa was stopped and eventually it was changed to Eliquis. Since then her Hgb has been stable and she has been Guaiac Negative.       Past Medical History:  Diagnosis Date  . Allergy     . Atrial fibrillation ([place]) 08/2011   First diagnosed in 08/2011; duration of arrhythmia is uncertain  . Bilateral lower extremity edema   . Chronic diarrhea    diverticulosis  . COPD (chronic obstructive pulmonary disease) ([place])   . Dementia   . Diabetes mellitus, type 2 (HCC)    Diabetic neuropathy  . Dyspnea on exertion    pedal edema  . Gout   . Headache(784.0)    twice weekly  . Hyperlipidemia   . Hypertension   . Osteopenia    DEXA scan 01/2010  . Palpitations   . Seasonal allergies   . Stress incontinence   . Vertigo    Past Surgical History:  Procedure Laterality Date  . APPENDECTOMY    . BREAST BIOPSY  2002   Right  . CESAREAN SECTION     x 2  . CHOLECYSTECTOMY    . COLONOSCOPY  2007   Negative screening study  . HAMMER TOE SURGERY     Bilateral hammer toe amputation  . INCISIONAL HERNIA REPAIR    . KNEE ARTHROSCOPY W/ MENISCAL REPAIR  2007   Bilateral  . UMBILICAL HERNIA REPAIR      Allergies  Allergen Reactions  . Codeine Anaphylaxis and Hives  . Morphine And Related Anaphylaxis and Hives  . Penicillins Anaphylaxis  . Ace Inhibitors Cough    Outpatient Encounter Medications as of 09/11/2017  Medication Sig  . acetaminophen (TYLENOL) 325 MG tablet Take 650  mg by mouth every 6 (six) hours as needed.  Marland Kitchen alendronate (FOSAMAX) 70 MG tablet Give 1 tablet by mouth on Sunday. Take with a full glass of water on an empty stomach.  Marland Kitchen apixaban (ELIQUIS) 2.5 MG TABS tablet Take 5 mg by mouth 2 (two) times daily.   . carboxymethylcellulose (REFRESH PLUS) 0.5 % SOLN Place 1 drop into both eyes 4 (four) times daily.  . cholecalciferol (VITAMIN D) 1000 UNITS tablet Take 1,000 Units by mouth daily.  . citalopram (CELEXA) 20 MG tablet Take 1 tablet (20 mg total) by mouth daily.  . digoxin (LANOXIN) 0.125 MG tablet Take 1 tablet by mouth once a day (HOLD FOR AP UNDER 60)  . furosemide (LASIX) 20 MG tablet Take 60 mg by mouth.   . insulin NPH-regular Human (NOVOLIN  70/30) (70-30) 100 UNIT/ML injection Give 30 units subcutaneous once a evening  . insulin NPH-regular Human (NOVOLIN 70/30) (70-30) 100 UNIT/ML injection Give 55 units subcutaneous one a morning  . Insulin Pen Needle (EASY TOUCH PEN NEEDLES) 31G X 8 MM MISC Use twice a day  . losartan (COZAAR) 25 MG tablet Take 25 mg by mouth daily.  . memantine (NAMENDA) 10 MG tablet Take 10 mg by mouth 2 (two) times daily.  . metoprolol tartrate (LOPRESSOR) 25 MG tablet Take 75 mg by mouth 2 (two) times daily.  . Multiple Vitamin (MULTIVITAMIN WITH MINERALS) TABS Take 1 tablet by mouth every morning.  . pantoprazole (PROTONIX) 40 MG tablet Take 40 mg by mouth daily.  . polyethylene glycol (MIRALAX / GLYCOLAX) packet Take 17 g by mouth daily as needed.  Marland Kitchen POTASSIUM CHLORIDE ER PO Potassium chloride extended release capsule, give 20 meq by mouth once day  . pravastatin (PRAVACHOL) 20 MG tablet Take 20 mg by mouth daily. Take along with 40 mg to = 60 mg  . pravastatin (PRAVACHOL) 40 MG tablet Take 40 mg by mouth daily. Take along with 20 mg to = 60 mg  . sitaGLIPtin-metformin (JANUMET) 50-500 MG tablet Take 1 tablet by mouth 2 (two) times daily with a meal.  . [DISCONTINUED] K-DUR 20 MEQ tablet TAKE 2 TABLETS BY MOUTH ONCE DAILY.   No facility-administered encounter medications on file as of 09/11/2017.      Review of Systems  Unable to perform ROS: Dementia    Immunization History  Administered Date(s) Administered  . Influenza-Unspecified 02/04/2016, 02/03/2017  . Pneumococcal Conjugate-13 02/03/2017  . Pneumococcal-Unspecified 02/08/2016  . Tdap 01/19/2017   Pertinent  Health Maintenance Due  Topic Date Due  . URINE MICROALBUMIN  09/13/2017 (Originally 05/11/2017)  . HEMOGLOBIN A1C  11/13/2017  . INFLUENZA VACCINE  11/30/2017  . FOOT EXAM  01/25/2018  . OPHTHALMOLOGY EXAM  02/22/2018  . DEXA SCAN  Completed  . PNA vac Low Risk Adult  Completed   Fall Risk  01/19/2017 11/06/2015 09/29/2015 08/31/2015  06/30/2015  Falls in the past year? No Yes No No No  Number falls in past yr: - 2 or more - - -  Injury with Fall? - No - - -   Functional Status Survey:    Vitals:   09/11/17 0927  BP: (!) 143/73  Pulse: 79  Resp: 20  Temp: 97.6 F (36.4 C)  TempSrc: Oral   There is no height or weight on file to calculate BMI. Physical Exam  Constitutional: She appears well-developed and well-nourished.  HENT:  Head: Normocephalic.  Mouth/Throat: Oropharynx is clear and moist.  Eyes: Pupils are equal, round, and  reactive to light.  Neck: Neck supple.  Cardiovascular: Normal rate. An irregular rhythm present.  No murmur heard. Pulmonary/Chest: Effort normal and breath sounds normal. No stridor. No respiratory distress. She has no wheezes.  Abdominal: Soft. Bowel sounds are normal. She exhibits no distension. There is no tenderness. There is no guarding.  Musculoskeletal:  Edema Right More then Left. With Redness around the Ankle and Foot.  Neurological: She is alert.  Not oriented  Skin: Skin is warm. Rash noted.  Psychiatric: She has a normal mood and affect. Her behavior is normal. Thought content normal.    Labs reviewed: Recent Labs    08/08/17 1459 08/08/17 1715 08/11/17 0730  NA 135 137 139  K 4.1 4.0 3.9  CL 98* 99* 102  CO2 25 27 27   GLUCOSE 189* 208* 163*  BUN 25* 26* 23*  CREATININE 1.13* 1.08* 0.94  CALCIUM 8.9 9.0 8.8*   Recent Labs    11/14/16 0730 08/08/17 1459  AST 15 21  ALT 15 15  ALKPHOS 48 54  BILITOT 0.7 0.5  PROT 7.2 7.5  ALBUMIN 3.2* 3.3*   Recent Labs    08/08/17 1715 08/11/17 0730 08/21/17 0330 09/04/17 0612  WBC 9.2 7.5 8.7 10.0  NEUTROABS 6.3 4.9 4.8  --   HGB 10.3* 10.3* 10.8* 10.2*  HCT 33.6* 33.1* 36.1 33.3*  MCV 93.3 92.5 94.3 91.7  PLT 241 254 242 222   Lab Results  Component Value Date   TSH 2.822 08/08/2017   Lab Results  Component Value Date   HGBA1C 6.4 (H) 05/16/2017   Lab Results  Component Value Date   CHOL  156 05/16/2017   HDL 38 (L) 05/16/2017   LDLCALC 104 (H) 05/16/2017   TRIG 72 05/16/2017   CHOLHDL 4.1 05/16/2017    Significant Diagnostic Results in last 30 days:  Dg Tibia/fibula Right  Result Date: 09/05/2017 CLINICAL DATA:  [place] recently with pain and swelling in the right leg EXAM: RIGHT TIBIA AND FIBULA - 2 VIEW COMPARISON:  None FINDINGS: The right tibia and fibula are intact. There is bony bridging across the distal right femur toward the tibia of a portion of the interosseous membrane. No acute fracture is seen. 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 IMPRESSION: No acute fracture. 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 Electronically Signed   By: Ivar Drape M.D.   On: 09/05/2017 08:07   Dg Foot Complete Right  Result Date: 09/05/2017 CLINICAL DATA:  Recent fall with pain and swelling of the right leg EXAM: RIGHT FOOT COMPLETE - 3+ VIEW COMPARISON:  None. FINDINGS: The bones are diffusely osteopenic. There is hallux valgus present, and mild to moderate degenerative change of the right first MTP joint is present. No acute fracture is seen. Alignment is normal otherwise. There are degenerative changes in the midfoot as well as the calcaneal degenerative spur on the plantar aspect. Slight deformity of the distal right fifth metatarsal neck probably is due to prior fracture with healing. IMPRESSION: No acute fracture.  Diffuse osteopenia.  Degenerative changes. Electronically Signed   By: Ivar Drape M.D.   On: 09/05/2017 08:09   Dg Femur, Min 2 Views Right  Result Date: 09/05/2017 CLINICAL DATA:  [place] with pain and swelling in the right leg EXAM: RIGHT FEMUR 2 VIEWS COMPARISON:  Right femur films of 01/30/2015 FINDINGS: The right hip joint appears normal with only minimal degenerative  change for age. No fracture is seen. Right femur is intact. A sclerotic area in the distal  right femur is stable compared to prior films and most likely represents a benign process. There are calcifications of the superficial femoral artery noted with degenerative changes present in the right knee IMPRESSION: No acute fracture. Electronically Signed   By: Ivar Drape M.D.   On: 09/05/2017 08:06    Assessment/Plan  Cellulitis Of Right LE Will start her On Doxycycline Follow in Few days Dopplers done in the past have been negative Patient already on Eliquis 5 mg BID.  GI bleed. Her Hgb had dropped from 12 to 10 but has been stable since then. She had Bright Red Blood PR. Has been Occult negative recently.  Pradaxa Changed to Eliquis Has GI Appointment this week.   Family/ staff Communication:   Labs/tests ordered:   Total time spent in this patient care encounter was 25_ minutes; greater than 50% of the visit spent counseling patient, reviewing records , Labs and coordinating care for problems addressed at this encounter.

## 2017-09-14 ENCOUNTER — Encounter: Payer: Self-pay | Admitting: Internal Medicine

## 2017-09-14 ENCOUNTER — Ambulatory Visit (INDEPENDENT_AMBULATORY_CARE_PROVIDER_SITE_OTHER): Payer: Medicare Other | Admitting: Gastroenterology

## 2017-09-14 ENCOUNTER — Encounter

## 2017-09-14 ENCOUNTER — Other Ambulatory Visit: Payer: Self-pay

## 2017-09-14 ENCOUNTER — Encounter: Payer: Self-pay | Admitting: Gastroenterology

## 2017-09-14 ENCOUNTER — Telehealth: Payer: Self-pay

## 2017-09-14 ENCOUNTER — Non-Acute Institutional Stay (SKILLED_NURSING_FACILITY): Payer: Medicare Other | Admitting: Internal Medicine

## 2017-09-14 DIAGNOSIS — K625 Hemorrhage of anus and rectum: Secondary | ICD-10-CM | POA: Insufficient documentation

## 2017-09-14 DIAGNOSIS — I482 Chronic atrial fibrillation, unspecified: Secondary | ICD-10-CM

## 2017-09-14 DIAGNOSIS — L03115 Cellulitis of right lower limb: Secondary | ICD-10-CM | POA: Diagnosis not present

## 2017-09-14 DIAGNOSIS — I10 Essential (primary) hypertension: Secondary | ICD-10-CM | POA: Diagnosis not present

## 2017-09-14 DIAGNOSIS — D649 Anemia, unspecified: Secondary | ICD-10-CM

## 2017-09-14 NOTE — Progress Notes (Signed)
Location:   [facility] Room Number: 133/W Place of Service:  SNF [id number]) Provider:  Jefm Bryant, MD  Patient Care Team: Celedonio Savage, MD as PCP - General (Family Medicine) Rothbart, Cristopher Estimable, MD (Cardiology) Lendon Colonel, NP as Nurse Practitioner (Nurse Practitioner)  Extended Emergency Contact Information Primary Emergency Contact: [facility] Address: [address]          [place],  [address] [name] of [place] Phone: [phone] Mobile Phone: [phone] Relation: Daughter  Code Status:  DNR Goals of care: Advanced Directive information Advanced Directives 09/14/2017  Does Patient Have a Medical Advance Directive? Yes  Type of Advance Directive Out of facility DNR (pink MOST or yellow form)  Does patient want to make changes to medical advance directive? No - Patient declined  Copy of [place] in Chart? No - copy requested  Pre-existing out of facility DNR order (yellow form or pink MOST form) -     Chief Complaint  Patient presents with  . Acute Visit    Patient being seen for F/U Visit for right lower leg redness     HPI:  Pt is a 77 y.o. female seen today for an acute visit for Low grade fever and LE cellulitis. Patient has h/o Diabetes mellitus type 2 , atrial fibrillation on chronic anticoagulation, Venous stasis, Dementia with Behavior problems, S/P fall leading to Ocular floor fracture.and recent  Bleeding Per rectum.  Patient was seen few days ago for LE redness due to possible cellulitis. She was started on Doxycycline. Nurses were little concern as her temp was little elevated to 100.3 Though patient is feeling better. Her Redness is better. And Pain in that leg is better.Denies any Chills. No Dysuria or abdominal pain. No Cough or Chest pain..     Past Medical History:  Diagnosis Date  . Allergy   . Atrial fibrillation ([place]) 08/2011   First diagnosed in 08/2011;  duration of arrhythmia is uncertain  . Bilateral lower extremity edema   . Chronic diarrhea    diverticulosis  . COPD (chronic obstructive pulmonary disease) ([place])   . Dementia   . Diabetes mellitus, type 2 (HCC)    Diabetic neuropathy  . Dyspnea on exertion    pedal edema  . Gout   . Headache(784.0)    twice weekly  . Hyperlipidemia   . Hypertension   . Osteopenia    DEXA scan 01/2010  . Palpitations   . Seasonal allergies   . Stress incontinence   . Vertigo    Past Surgical History:  Procedure Laterality Date  . APPENDECTOMY    . BREAST BIOPSY  2002   Right  . CESAREAN SECTION     x 2  . CHOLECYSTECTOMY    . COLONOSCOPY  2007   Negative screening study  . HAMMER TOE SURGERY     Bilateral hammer toe amputation  . INCISIONAL HERNIA REPAIR    . KNEE ARTHROSCOPY W/ MENISCAL REPAIR  2007   Bilateral  . UMBILICAL HERNIA REPAIR      Allergies  Allergen Reactions  . Codeine Anaphylaxis and Hives  . Morphine And Related Anaphylaxis and Hives  . Penicillins Anaphylaxis  . Ace Inhibitors Cough    Outpatient Encounter Medications as of 09/14/2017  Medication Sig  . acetaminophen (TYLENOL) 325 MG tablet Take 650 mg by mouth every 6 (six) hours as needed.  Marland Kitchen alendronate (FOSAMAX) 70 MG tablet Give 1 tablet by mouth on  Sunday. Take with a full glass of water on an empty stomach.  Marland Kitchen apixaban (ELIQUIS) 2.5 MG TABS tablet Take 5 mg by mouth 2 (two) times daily.   . carboxymethylcellulose (REFRESH PLUS) 0.5 % SOLN Place 1 drop into both eyes 4 (four) times daily.  . cholecalciferol (VITAMIN D) 1000 UNITS tablet Take 1,000 Units by mouth daily.  . citalopram (CELEXA) 20 MG tablet Take 1 tablet (20 mg total) by mouth daily.  . digoxin (LANOXIN) 0.125 MG tablet Take 1 tablet by mouth once a day (HOLD FOR AP UNDER 60)  . doxycycline (DORYX) 100 MG EC tablet Take 100 mg by mouth 2 (two) times daily. Take for 10 days from 09/11/2017-09/24/2017  . furosemide (LASIX) 20 MG tablet Take  60 mg by mouth.   . insulin NPH-regular Human (NOVOLIN 70/30) (70-30) 100 UNIT/ML injection Give 30 units subcutaneous once a evening  . insulin NPH-regular Human (NOVOLIN 70/30) (70-30) 100 UNIT/ML injection Give 55 units subcutaneous one a morning  . Insulin Pen Needle (EASY TOUCH PEN NEEDLES) 31G X 8 MM MISC Use twice a day  . Lactobacillus Rhamnosus, GG, ([employer] PROBIOTIC, LACTOBACILLUS, PO) Take by mouth. Give 1 tablet by mouth once a day  . losartan (COZAAR) 25 MG tablet Take 25 mg by mouth daily.  . memantine (NAMENDA) 10 MG tablet Take 10 mg by mouth 2 (two) times daily.  . metoprolol tartrate (LOPRESSOR) 25 MG tablet Take 75 mg by mouth 2 (two) times daily.  . Multiple Vitamin (MULTIVITAMIN WITH MINERALS) TABS Take 1 tablet by mouth every morning.  . pantoprazole (PROTONIX) 40 MG tablet Take 40 mg by mouth daily.  . polyethylene glycol (MIRALAX / GLYCOLAX) packet Take 17 g by mouth daily as needed.  Marland Kitchen POTASSIUM CHLORIDE ER PO Potassium chloride extended release capsule, give 20 meq by mouth once day  . pravastatin (PRAVACHOL) 20 MG tablet Take 20 mg by mouth daily. Take along with 40 mg to = 60 mg  . pravastatin (PRAVACHOL) 40 MG tablet Take 40 mg by mouth daily. Take along with 20 mg to = 60 mg  . sitaGLIPtin-metformin (JANUMET) 50-500 MG tablet Take 1 tablet by mouth 2 (two) times daily with a meal.  . [DISCONTINUED] K-DUR 20 MEQ tablet TAKE 2 TABLETS BY MOUTH ONCE DAILY.   No facility-administered encounter medications on file as of 09/14/2017.      Review of Systems  Unable to perform ROS: Dementia    Immunization History  Administered Date(s) Administered  . Influenza-Unspecified 02/04/2016, 02/03/2017  . Pneumococcal Conjugate-13 02/03/2017  . Pneumococcal-Unspecified 02/08/2016  . Tdap 01/19/2017   Pertinent  Health Maintenance Due  Topic Date Due  . URINE MICROALBUMIN  10/15/2017 (Originally 05/11/2017)  . HEMOGLOBIN A1C  11/13/2017  . INFLUENZA VACCINE  11/30/2017  .  FOOT EXAM  01/25/2018  . OPHTHALMOLOGY EXAM  02/22/2018  . DEXA SCAN  Completed  . PNA vac Low Risk Adult  Completed   Fall Risk  01/19/2017 11/06/2015 09/29/2015 08/31/2015 06/30/2015  Falls in the past year? No Yes No No No  Number falls in past yr: - 2 or more - - -  Injury with Fall? - No - - -   Functional Status Survey:    Vitals:   09/14/17 0925  BP: (!) 146/78  Pulse: (!) 52  Resp: 17  Temp: 100.2 F (37.9 C)  TempSrc: Oral  SpO2: 97%   There is no height or weight on file to calculate BMI. Physical Exam  Constitutional: She appears well-developed and well-nourished.  HENT:  Head: Normocephalic.  Eyes: Pupils are equal, round, and reactive to light.  Neck: Neck supple.  Cardiovascular: Normal rate. An irregular rhythm present.  No murmur heard. Pulmonary/Chest: Effort normal and breath sounds normal. No stridor. No respiratory distress. She has no wheezes.  Abdominal: Soft. Bowel sounds are normal. She exhibits no distension. There is no tenderness.  Musculoskeletal:  Edema and redness is better in her LE  Neurological: She is alert.  Skin: Skin is warm and dry.  Psychiatric: She has a normal mood and affect. Her behavior is normal.    Labs reviewed: Recent Labs    08/08/17 1459 08/08/17 1715 08/11/17 0730  NA 135 137 139  K 4.1 4.0 3.9  CL 98* 99* 102  CO2 25 27 27   GLUCOSE 189* 208* 163*  BUN 25* 26* 23*  CREATININE 1.13* 1.08* 0.94  CALCIUM 8.9 9.0 8.8*   Recent Labs    11/14/16 0730 08/08/17 1459  AST 15 21  ALT 15 15  ALKPHOS 48 54  BILITOT 0.7 0.5  PROT 7.2 7.5  ALBUMIN 3.2* 3.3*   Recent Labs    08/08/17 1715 08/11/17 0730 08/21/17 0330 09/04/17 0612  WBC 9.2 7.5 8.7 10.0  NEUTROABS 6.3 4.9 4.8  --   HGB 10.3* 10.3* 10.8* 10.2*  HCT 33.6* 33.1* 36.1 33.3*  MCV 93.3 92.5 94.3 91.7  PLT 241 254 242 222   Lab Results  Component Value Date   TSH 2.822 08/08/2017   Lab Results  Component Value Date   HGBA1C 6.4 (H) 05/16/2017    Lab Results  Component Value Date   CHOL 156 05/16/2017   HDL 38 (L) 05/16/2017   LDLCALC 104 (H) 05/16/2017   TRIG 72 05/16/2017   CHOLHDL 4.1 05/16/2017    Significant Diagnostic Results in last 30 days:  Dg Tibia/fibula Right  Result Date: 09/05/2017 CLINICAL DATA:  [place] recently with pain and swelling in the right leg EXAM: RIGHT TIBIA AND FIBULA - 2 VIEW COMPARISON:  None FINDINGS: The right tibia and fibula are intact. There is bony bridging across the distal right femur toward the tibia of a portion of the interosseous membrane. No acute fracture is seen. 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 IMPRESSION: No acute fracture. 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 Electronically Signed   By: Ivar Drape M.D.   On: 09/05/2017 08:07   Dg Foot Complete Right  Result Date: 09/05/2017 CLINICAL DATA:  Recent fall with pain and swelling of the right leg EXAM: RIGHT FOOT COMPLETE - 3+ VIEW COMPARISON:  None. FINDINGS: The bones are diffusely osteopenic. There is hallux valgus present, and mild to moderate degenerative change of the right first MTP joint is present. No acute fracture is seen. Alignment is normal otherwise. There are degenerative changes in the midfoot as well as the calcaneal degenerative spur on the plantar aspect. Slight deformity of the distal right fifth metatarsal neck probably is due to prior fracture with healing. IMPRESSION: No acute fracture.  Diffuse osteopenia.  Degenerative changes. Electronically Signed   By: Ivar Drape M.D.   On: 09/05/2017 08:09   Dg Femur, Min 2 Views Right  Result Date: 09/05/2017 CLINICAL DATA:  [place] with pain and swelling in the right leg EXAM: RIGHT FEMUR 2 VIEWS COMPARISON:  Right femur films of 01/30/2015 FINDINGS: The right hip joint appears normal  with only minimal degenerative change for age. No fracture is seen. Right femur  is intact. A sclerotic area in the distal right femur is stable compared to prior films and most likely represents a benign process. There are calcifications of the superficial femoral artery noted with degenerative changes present in the right knee IMPRESSION: No acute fracture. Electronically Signed   By: Ivar Drape M.D.   On: 09/05/2017 08:06    Assessment/Plan  Cellulitis of RLE It looks much Better. Pain is better also. Will continue to monitor. If she spikes temp again will look for other sources of infection. Continue on Doxycyline. H/o Blood Per rectum Her Hgb had dropped from 12 to 10 but has been stable since then. She had Bright Red Blood PR. Has been Occult negative recently.  Pradaxa Changed to Eliquis Has appointment with GI    Family/ staff Communication:   Labs/tests ordered:

## 2017-09-14 NOTE — H&P (View-Only) (Signed)
Primary Care Physician:  Celedonio Savage, MD  Primary Gastroenterologist:  Garfield Cornea, MD   Chief Complaint  Patient presents with  . Rectal Bleeding    Bright red blood x 1 month, happens all the time    HPI:  Christine [name] is a 77 y.o. female here for further evaluation of rectal bleeding.  Bleeding first noted in early April.  According to patient's daughter [name], the patient had fresh blood per rectum with small clots initially.  She went to the emergency department for evaluation.  It was noted that her hemoglobin back in February was 12 and in the emergency department was 10.8. On rectal exam she had bright red blood present which was heme positive.  Impression was that she was having minimal bleeding, she is discharged back to the nursing home.  Decision was made to hold her Xarelto and arrange for GI evaluation.  Reportedly her Xarelto was held for couple days, then she was started on low-dose Eliquis 2.5 mg twice daily which has been increased to 5 mg twice daily.  Her hemoglobin has remained stable, last checked on May 6 was 10.2.  According to [facility], patient has had small amount of rectal bleeding regularly since then.  She is currently on doxycycline for cellulitis and believes that this is helped the rectal bleeding as well.  There has been no report of constipation or diarrhea.  She is on MiraLAX.  Patient has a good appetite and eats well.  No vomiting.  No dysphagia concerns.  No weight loss.  She had a colonoscopy by Dr. Gala Romney in 2007 which was negative.     Current Outpatient Medications on File Prior to Visit  Medication Sig Dispense Refill  . acetaminophen (TYLENOL) 325 MG tablet Take 650 mg by mouth every 6 (six) hours as needed.    Marland Kitchen alendronate (FOSAMAX) 70 MG tablet Give 1 tablet by mouth on Sunday. Take with a full glass of water on an empty stomach.    Marland Kitchen apixaban (ELIQUIS) 2.5 MG TABS tablet Take 5 mg by mouth 2 (two) times daily.     . carboxymethylcellulose  (REFRESH PLUS) 0.5 % SOLN Place 1 drop into both eyes 4 (four) times daily.    . cholecalciferol (VITAMIN D) 1000 UNITS tablet Take 1,000 Units by mouth daily.    . citalopram (CELEXA) 20 MG tablet Take 1 tablet (20 mg total) by mouth daily. 90 tablet 3  . digoxin (LANOXIN) 0.125 MG tablet Take 1 tablet by mouth once a day (HOLD FOR AP UNDER 60)    . furosemide (LASIX) 20 MG tablet Take 60 mg by mouth.     . insulin NPH-regular Human (NOVOLIN 70/30) (70-30) 100 UNIT/ML injection Give 30 units subcutaneous once a evening    . insulin NPH-regular Human (NOVOLIN 70/30) (70-30) 100 UNIT/ML injection Give 55 units subcutaneous one a morning    . Insulin Pen Needle (EASY TOUCH PEN NEEDLES) 31G X 8 MM MISC Use twice a day    . losartan (COZAAR) 25 MG tablet Take 25 mg by mouth daily.    . memantine (NAMENDA) 10 MG tablet Take 10 mg by mouth 2 (two) times daily.    . metoprolol tartrate (LOPRESSOR) 25 MG tablet Take 75 mg by mouth 2 (two) times daily.    . Multiple Vitamin (MULTIVITAMIN WITH MINERALS) TABS Take 1 tablet by mouth every morning.    . pantoprazole (PROTONIX) 40 MG tablet Take 40 mg by mouth daily.    Marland Kitchen  polyethylene glycol (MIRALAX / GLYCOLAX) packet Take 17 g by mouth daily as needed.    Marland Kitchen POTASSIUM CHLORIDE ER PO Potassium chloride extended release capsule, give 20 meq by mouth once day    . pravastatin (PRAVACHOL) 20 MG tablet Take 20 mg by mouth daily. Take along with 40 mg to = 60 mg    . pravastatin (PRAVACHOL) 40 MG tablet Take 40 mg by mouth daily. Take along with 20 mg to = 60 mg    . sitaGLIPtin-metformin (JANUMET) 50-500 MG tablet Take 1 tablet by mouth 2 (two) times daily with a meal.    . [DISCONTINUED] K-DUR 20 MEQ tablet TAKE 2 TABLETS BY MOUTH ONCE DAILY. 60 tablet 5   No current facility-administered medications on file prior to visit.      Allergies as of 09/14/2017 - Review Complete 09/14/2017  Allergen Reaction Noted  . Codeine Anaphylaxis and Hives 08/31/2011  .  Morphine and related Anaphylaxis and Hives 08/31/2011  . Penicillins Anaphylaxis 08/31/2011  . Ace inhibitors Cough 08/31/2011    Past Medical History:  Diagnosis Date  . Allergy   . Atrial fibrillation ([place]) 08/2011   First diagnosed in 08/2011; duration of arrhythmia is uncertain  . Bilateral lower extremity edema   . Chronic diarrhea    diverticulosis  . COPD (chronic obstructive pulmonary disease) ([place])   . Dementia   . Diabetes mellitus, type 2 (HCC)    Diabetic neuropathy  . Dyspnea on exertion    pedal edema  . Gout   . Headache(784.0)    twice weekly  . Hyperlipidemia   . Hypertension   . Osteopenia    DEXA scan 01/2010  . Palpitations   . Seasonal allergies   . Stress incontinence   . Vertigo     Past Surgical History:  Procedure Laterality Date  . APPENDECTOMY    . BREAST BIOPSY  2002   Right  . CESAREAN SECTION     x 2  . CHOLECYSTECTOMY    . COLONOSCOPY  2007   Negative screening study  . HAMMER TOE SURGERY     Bilateral hammer toe amputation  . INCISIONAL HERNIA REPAIR    . KNEE ARTHROSCOPY W/ MENISCAL REPAIR  2007   Bilateral  . UMBILICAL HERNIA REPAIR      Family History  Adopted: Yes    Social History   Socioeconomic History  . Marital status: Widowed    Spouse name: Not on file  . Number of children: 2  . Years of education: Not on file  . Highest education level: Not on file  Occupational History  . Occupation: Pension scheme manager  . Financial resource strain: Not on file  . Food insecurity:    Worry: Not on file    Inability: Not on file  . Transportation needs:    Medical: Not on file    Non-medical: Not on file  Tobacco Use  . Smoking status: Never Smoker  . Smokeless tobacco: Never Used  Substance and Sexual Activity  . Alcohol use: No  . Drug use: No  . Sexual activity: Not on file  Lifestyle  . Physical activity:    Days per week: Not on file    Minutes per session: Not on file  . Stress: Not on file   Relationships  . Social connections:    Talks on phone: Not on file    Gets together: Not on file    Attends religious service: Not on file  Active member of club or organization: Not on file    Attends meetings of clubs or organizations: Not on file    Relationship status: Not on file  . Intimate partner violence:    Fear of current or ex partner: Not on file    Emotionally abused: Not on file    Physically abused: Not on file    Forced sexual activity: Not on file  Other Topics Concern  . Not on file  Social History Narrative  . Not on file      ROS: Patient with baseline dementia, review of systems unreliable.  See HPI    Physical Examination:  BP 103/70   Pulse 81   Temp (!) 97 F (36.1 C) (Oral)   Ht 5\' 6"  (1.676 m)   BMI 34.93 kg/m    General: Well-nourished, well-developed in no acute distress.  Pleasant.  Alert and oriented to person. Head: Normocephalic, atraumatic.   Eyes: Conjunctiva pink, no icterus. Mouth: Oropharyngeal mucosa moist and pink , no lesions erythema or exudate. Neck: Supple  Lungs: Clear to auscultation bilaterally.  Heart: Regular rate and rhythm, no murmurs rubs or gallops.  Abdomen: Bowel sounds are normal, nontender, nondistended, no masses, no abdominal bruits or    hernia , no rebound or guarding.  Rectal: not performed Extremities: No lower extremity edema. No clubbing or deformities.  Neuro: Alert and oriented x 4 , grossly normal neurologically.  Skin: Warm and dry, no rash or jaundice.   Psych: Alert and cooperative, normal mood and affect.  Labs: Lab Results  Component Value Date   WBC 10.0 09/04/2017   HGB 10.2 (L) 09/04/2017   HCT 33.3 (L) 09/04/2017   MCV 91.7 09/04/2017   PLT 222 09/04/2017   Lab Results  Component Value Date   CREATININE 0.94 08/11/2017   BUN 23 (H) 08/11/2017   NA 139 08/11/2017   K 3.9 08/11/2017   CL 102 08/11/2017   CO2 27 08/11/2017   Lab Results  Component Value Date   ALT 15  08/08/2017   AST 21 08/08/2017   ALKPHOS 54 08/08/2017   BILITOT 0.5 08/08/2017     Imaging Studies: Dg Tibia/fibula Right  Result Date: 09/05/2017 CLINICAL DATA:  [place] recently with pain and swelling in the right leg EXAM: RIGHT TIBIA AND FIBULA - 2 VIEW COMPARISON:  None FINDINGS: The right tibia and fibula are intact. There is bony bridging across the distal right femur toward the tibia of a portion of the interosseous membrane. No acute fracture is seen. 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 IMPRESSION: No acute fracture. 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 Electronically Signed   By: Ivar Drape M.D.   On: 09/05/2017 08:07   Dg Foot Complete Right  Result Date: 09/05/2017 CLINICAL DATA:  Recent fall with pain and swelling of the right leg EXAM: RIGHT FOOT COMPLETE - 3+ VIEW COMPARISON:  None. FINDINGS: The bones are diffusely osteopenic. There is hallux valgus present, and mild to moderate degenerative change of the right first MTP joint is present. No acute fracture is seen. Alignment is normal otherwise. There are degenerative changes in the midfoot as well as the calcaneal degenerative spur on the plantar aspect. Slight deformity of the distal right fifth metatarsal neck probably is due to prior fracture with healing. IMPRESSION: No acute fracture.  Diffuse osteopenia.  Degenerative changes. Electronically Signed   By: Eddie Dibbles  Alvester Chou M.D.   On: 09/05/2017 08:09   Dg Femur, Min 2 Views Right  Result Date: 09/05/2017 CLINICAL DATA:  [place] with pain and swelling in the right leg EXAM: RIGHT FEMUR 2 VIEWS COMPARISON:  Right femur films of 01/30/2015 FINDINGS: The right hip joint appears normal with only minimal degenerative change for age. No fracture is seen. Right femur is intact. A sclerotic area in the distal right femur is stable compared to prior films and most likely  represents a benign process. There are calcifications of the superficial femoral artery noted with degenerative changes present in the right knee IMPRESSION: No acute fracture. Electronically Signed   By: Ivar Drape M.D.   On: 09/05/2017 08:06

## 2017-09-14 NOTE — Telephone Encounter (Signed)
[place] and spoke to [place]. TCS w/Propofol w/RMR scheduled for 09/18/17 at 11:00am. Orders entered. Instructions and prep rx faxed to nursing home (attn: Delphina). Pre-op nurse reviewed chart, can do pre-op phone call tomorrow. Called Mavis back and informed her.   Called daughter [name]) and informed of TCS being scheduled.

## 2017-09-14 NOTE — Progress Notes (Signed)
Primary Care Physician:  Celedonio Savage, MD  Primary Gastroenterologist:  Garfield Cornea, MD   Chief Complaint  Patient presents with  . Rectal Bleeding    Bright red blood x 1 month, happens all the time    HPI:  Christine [name] is a 77 y.o. female here for further evaluation of rectal bleeding.  Bleeding first noted in early April.  According to patient's daughter [name], the patient had fresh blood per rectum with small clots initially.  She went to the emergency department for evaluation.  It was noted that her hemoglobin back in February was 12 and in the emergency department was 10.8. On rectal exam she had bright red blood present which was heme positive.  Impression was that she was having minimal bleeding, she is discharged back to the nursing home.  Decision was made to hold her Xarelto and arrange for GI evaluation.  Reportedly her Xarelto was held for couple days, then she was started on low-dose Eliquis 2.5 mg twice daily which has been increased to 5 mg twice daily.  Her hemoglobin has remained stable, last checked on May 6 was 10.2.  According to [facility], patient has had small amount of rectal bleeding regularly since then.  She is currently on doxycycline for cellulitis and believes that this is helped the rectal bleeding as well.  There has been no report of constipation or diarrhea.  She is on MiraLAX.  Patient has a good appetite and eats well.  No vomiting.  No dysphagia concerns.  No weight loss.  She had a colonoscopy by Dr. Gala Romney in 2007 which was negative.     Current Outpatient Medications on File Prior to Visit  Medication Sig Dispense Refill  . acetaminophen (TYLENOL) 325 MG tablet Take 650 mg by mouth every 6 (six) hours as needed.    Marland Kitchen alendronate (FOSAMAX) 70 MG tablet Give 1 tablet by mouth on Sunday. Take with a full glass of water on an empty stomach.    Marland Kitchen apixaban (ELIQUIS) 2.5 MG TABS tablet Take 5 mg by mouth 2 (two) times daily.     . carboxymethylcellulose  (REFRESH PLUS) 0.5 % SOLN Place 1 drop into both eyes 4 (four) times daily.    . cholecalciferol (VITAMIN D) 1000 UNITS tablet Take 1,000 Units by mouth daily.    . citalopram (CELEXA) 20 MG tablet Take 1 tablet (20 mg total) by mouth daily. 90 tablet 3  . digoxin (LANOXIN) 0.125 MG tablet Take 1 tablet by mouth once a day (HOLD FOR AP UNDER 60)    . furosemide (LASIX) 20 MG tablet Take 60 mg by mouth.     . insulin NPH-regular Human (NOVOLIN 70/30) (70-30) 100 UNIT/ML injection Give 30 units subcutaneous once a evening    . insulin NPH-regular Human (NOVOLIN 70/30) (70-30) 100 UNIT/ML injection Give 55 units subcutaneous one a morning    . Insulin Pen Needle (EASY TOUCH PEN NEEDLES) 31G X 8 MM MISC Use twice a day    . losartan (COZAAR) 25 MG tablet Take 25 mg by mouth daily.    . memantine (NAMENDA) 10 MG tablet Take 10 mg by mouth 2 (two) times daily.    . metoprolol tartrate (LOPRESSOR) 25 MG tablet Take 75 mg by mouth 2 (two) times daily.    . Multiple Vitamin (MULTIVITAMIN WITH MINERALS) TABS Take 1 tablet by mouth every morning.    . pantoprazole (PROTONIX) 40 MG tablet Take 40 mg by mouth daily.    Marland Kitchen  polyethylene glycol (MIRALAX / GLYCOLAX) packet Take 17 g by mouth daily as needed.    Marland Kitchen POTASSIUM CHLORIDE ER PO Potassium chloride extended release capsule, give 20 meq by mouth once day    . pravastatin (PRAVACHOL) 20 MG tablet Take 20 mg by mouth daily. Take along with 40 mg to = 60 mg    . pravastatin (PRAVACHOL) 40 MG tablet Take 40 mg by mouth daily. Take along with 20 mg to = 60 mg    . sitaGLIPtin-metformin (JANUMET) 50-500 MG tablet Take 1 tablet by mouth 2 (two) times daily with a meal.    . [DISCONTINUED] K-DUR 20 MEQ tablet TAKE 2 TABLETS BY MOUTH ONCE DAILY. 60 tablet 5   No current facility-administered medications on file prior to visit.      Allergies as of 09/14/2017 - Review Complete 09/14/2017  Allergen Reaction Noted  . Codeine Anaphylaxis and Hives 08/31/2011  .  Morphine and related Anaphylaxis and Hives 08/31/2011  . Penicillins Anaphylaxis 08/31/2011  . Ace inhibitors Cough 08/31/2011    Past Medical History:  Diagnosis Date  . Allergy   . Atrial fibrillation ([place]) 08/2011   First diagnosed in 08/2011; duration of arrhythmia is uncertain  . Bilateral lower extremity edema   . Chronic diarrhea    diverticulosis  . COPD (chronic obstructive pulmonary disease) ([place])   . Dementia   . Diabetes mellitus, type 2 (HCC)    Diabetic neuropathy  . Dyspnea on exertion    pedal edema  . Gout   . Headache(784.0)    twice weekly  . Hyperlipidemia   . Hypertension   . Osteopenia    DEXA scan 01/2010  . Palpitations   . Seasonal allergies   . Stress incontinence   . Vertigo     Past Surgical History:  Procedure Laterality Date  . APPENDECTOMY    . BREAST BIOPSY  2002   Right  . CESAREAN SECTION     x 2  . CHOLECYSTECTOMY    . COLONOSCOPY  2007   Negative screening study  . HAMMER TOE SURGERY     Bilateral hammer toe amputation  . INCISIONAL HERNIA REPAIR    . KNEE ARTHROSCOPY W/ MENISCAL REPAIR  2007   Bilateral  . UMBILICAL HERNIA REPAIR      Family History  Adopted: Yes    Social History   Socioeconomic History  . Marital status: Widowed    Spouse name: Not on file  . Number of children: 2  . Years of education: Not on file  . Highest education level: Not on file  Occupational History  . Occupation: Pension scheme manager  . Financial resource strain: Not on file  . Food insecurity:    Worry: Not on file    Inability: Not on file  . Transportation needs:    Medical: Not on file    Non-medical: Not on file  Tobacco Use  . Smoking status: Never Smoker  . Smokeless tobacco: Never Used  Substance and Sexual Activity  . Alcohol use: No  . Drug use: No  . Sexual activity: Not on file  Lifestyle  . Physical activity:    Days per week: Not on file    Minutes per session: Not on file  . Stress: Not on file   Relationships  . Social connections:    Talks on phone: Not on file    Gets together: Not on file    Attends religious service: Not on file  Active member of club or organization: Not on file    Attends meetings of clubs or organizations: Not on file    Relationship status: Not on file  . Intimate partner violence:    Fear of current or ex partner: Not on file    Emotionally abused: Not on file    Physically abused: Not on file    Forced sexual activity: Not on file  Other Topics Concern  . Not on file  Social History Narrative  . Not on file      ROS: Patient with baseline dementia, review of systems unreliable.  See HPI    Physical Examination:  BP 103/70   Pulse 81   Temp (!) 97 F (36.1 C) (Oral)   Ht 5\' 6"  (1.676 m)   BMI 34.93 kg/m    General: Well-nourished, well-developed in no acute distress.  Pleasant.  Alert and oriented to person. Head: Normocephalic, atraumatic.   Eyes: Conjunctiva pink, no icterus. Mouth: Oropharyngeal mucosa moist and pink , no lesions erythema or exudate. Neck: Supple  Lungs: Clear to auscultation bilaterally.  Heart: Regular rate and rhythm, no murmurs rubs or gallops.  Abdomen: Bowel sounds are normal, nontender, nondistended, no masses, no abdominal bruits or    hernia , no rebound or guarding.  Rectal: not performed Extremities: No lower extremity edema. No clubbing or deformities.  Neuro: Alert and oriented x 4 , grossly normal neurologically.  Skin: Warm and dry, no rash or jaundice.   Psych: Alert and cooperative, normal mood and affect.  Labs: Lab Results  Component Value Date   WBC 10.0 09/04/2017   HGB 10.2 (L) 09/04/2017   HCT 33.3 (L) 09/04/2017   MCV 91.7 09/04/2017   PLT 222 09/04/2017   Lab Results  Component Value Date   CREATININE 0.94 08/11/2017   BUN 23 (H) 08/11/2017   NA 139 08/11/2017   K 3.9 08/11/2017   CL 102 08/11/2017   CO2 27 08/11/2017   Lab Results  Component Value Date   ALT 15  08/08/2017   AST 21 08/08/2017   ALKPHOS 54 08/08/2017   BILITOT 0.5 08/08/2017     Imaging Studies: Dg Tibia/fibula Right  Result Date: 09/05/2017 CLINICAL DATA:  [place] recently with pain and swelling in the right leg EXAM: RIGHT TIBIA AND FIBULA - 2 VIEW COMPARISON:  None FINDINGS: The right tibia and fibula are intact. There is bony bridging across the distal right femur toward the tibia of a portion of the interosseous membrane. No acute fracture is seen. 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 IMPRESSION: No acute fracture. 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 Electronically Signed   By: Ivar Drape M.D.   On: 09/05/2017 08:07   Dg Foot Complete Right  Result Date: 09/05/2017 CLINICAL DATA:  Recent fall with pain and swelling of the right leg EXAM: RIGHT FOOT COMPLETE - 3+ VIEW COMPARISON:  None. FINDINGS: The bones are diffusely osteopenic. There is hallux valgus present, and mild to moderate degenerative change of the right first MTP joint is present. No acute fracture is seen. Alignment is normal otherwise. There are degenerative changes in the midfoot as well as the calcaneal degenerative spur on the plantar aspect. Slight deformity of the distal right fifth metatarsal neck probably is due to prior fracture with healing. IMPRESSION: No acute fracture.  Diffuse osteopenia.  Degenerative changes. Electronically Signed   By: Eddie Dibbles  Alvester Chou M.D.   On: 09/05/2017 08:09   Dg Femur, Min 2 Views Right  Result Date: 09/05/2017 CLINICAL DATA:  [place] with pain and swelling in the right leg EXAM: RIGHT FEMUR 2 VIEWS COMPARISON:  Right femur films of 01/30/2015 FINDINGS: The right hip joint appears normal with only minimal degenerative change for age. No fracture is seen. Right femur is intact. A sclerotic area in the distal right femur is stable compared to prior films and most likely  represents a benign process. There are calcifications of the superficial femoral artery noted with degenerative changes present in the right knee IMPRESSION: No acute fracture. Electronically Signed   By: Ivar Drape M.D.   On: 09/05/2017 08:06

## 2017-09-14 NOTE — Assessment & Plan Note (Addendum)
77 year old female on chronic anticoagulation with recent onset rectal bleeding as outlined above.  Hemoglobin has remained stable over the past few weeks although declined from back in February.  Her last colonoscopy was in 2007.  She may have benign anal rectal bleeding in the setting of chronic anticoagulation but cannot exclude more significant etiologies such as malignancy.  Offered colonoscopy in the near future and daughter would like to proceed.  I have discussed the risks, alternatives, benefits with regards to but not limited to the risk of reaction to medication, bleeding, infection, perforation and the daughter is agreeable to proceed. Written consent to be obtained.  Plans to hold Eliquis 48 hours prior to the procedure.

## 2017-09-14 NOTE — Patient Instructions (Signed)
1. Colonoscopy to be scheduled.

## 2017-09-15 ENCOUNTER — Encounter (HOSPITAL_COMMUNITY): Payer: Self-pay

## 2017-09-15 ENCOUNTER — Encounter (HOSPITAL_COMMUNITY)
Admit: 2017-09-15 | Discharge: 2017-09-15 | Disposition: A | Discharge status: Home / Self Care | Payer: Medicare Other | Attending: Internal Medicine | Admitting: Internal Medicine

## 2017-09-15 NOTE — Progress Notes (Signed)
cc'ed to pcp °

## 2017-09-15 NOTE — Patient Instructions (Signed)
    Christine [name]  09/15/2017     @PREFPERIOPPHARMACY @   Your procedure is scheduled on 09/18/2017.  Report to [facility] at 0900 A.M.  Call this number if you have problems the morning of surgery:  [phone]   Remember:  Follow instruction provided by Dr Roseanne Kaufman office regarding prep and additional information on when to start clear liquids              Take these medicines the morning of surgery with A SIP OF WATER Celexa, Lanoxin, Losartan, Namenda, Metoprolol, Protonix   70/30 INSULIN 09/17/2017 PM DOSE WILL BE DECREASED TO 21 UNITS  DO NOT GIVE ANY DIABETIC MEDICATIONS THE AM OF PROCEDURE    Do not wear jewelry, make-up or nail polish.  Do not wear lotions, powders, or perfumes, or deodorant.  Do not shave 48 hours prior to surgery.  Men may shave face and neck.  Do not bring valuables to the hospital.  [facility] is not responsible for any belongings or valuables.  Contacts, dentures or bridgework may not be worn into surgery.  Leave your suitcase in the car.  After surgery it may be brought to your room.  For patients admitted to the hospital, discharge time will be determined by your treatment team.  Patients discharged the day of surgery will not be allowed to drive home.   Please read over the following fact sheets that you were given. Anesthesia Post-op Instructions     PATIENT INSTRUCTIONS POST-ANESTHESIA  IMMEDIATELY FOLLOWING SURGERY:  Do not drive or operate machinery for the first twenty four hours after surgery.  Do not make any important decisions for twenty four hours after surgery or while taking narcotic pain medications or sedatives.  If you develop intractable nausea and vomiting or a severe headache please notify your doctor immediately.  FOLLOW-UP:  Please make an appointment with your surgeon as instructed. You do not need to follow up with anesthesia unless specifically instructed to do so.  WOUND CARE INSTRUCTIONS (if applicable):  Keep a dry  clean dressing on the anesthesia/puncture wound site if there is drainage.  Once the wound has quit draining you may leave it open to air.  Generally you should leave the bandage intact for twenty four hours unless there is drainage.  If the epidural site drains for more than 36-48 hours please call the anesthesia department.  QUESTIONS?:  Please feel free to call your physician or the hospital operator if you have any questions, and they will be happy to assist you.

## 2017-09-18 ENCOUNTER — Ambulatory Visit (HOSPITAL_COMMUNITY): Payer: Medicare Other | Admitting: Anesthesiology

## 2017-09-18 ENCOUNTER — Ambulatory Visit (HOSPITAL_COMMUNITY)
Admission: RE | Admit: 2017-09-18 | Discharge: 2017-09-18 | Disposition: A | Discharge status: Home / Self Care | Payer: Medicare Other | Source: Ambulatory Visit | Attending: Internal Medicine | Admitting: Internal Medicine

## 2017-09-18 ENCOUNTER — Inpatient Hospital Stay
Admission: RE | Admit: 2017-09-18 | Discharge: 2018-04-08 | Disposition: A | Discharge status: Nursing Home (Includes ICF) | Payer: Medicare Other | Source: Ambulatory Visit | Attending: Internal Medicine | Admitting: Internal Medicine

## 2017-09-18 ENCOUNTER — Encounter (HOSPITAL_COMMUNITY)
Admission: RE | Disposition: A | Discharge status: Home / Self Care | Payer: Self-pay | Source: Ambulatory Visit | Attending: Internal Medicine

## 2017-09-18 ENCOUNTER — Other Ambulatory Visit: Payer: Self-pay

## 2017-09-18 ENCOUNTER — Encounter (HOSPITAL_COMMUNITY): Payer: Self-pay | Admitting: *Deleted

## 2017-09-18 DIAGNOSIS — E1140 Type 2 diabetes mellitus with diabetic neuropathy, unspecified: Secondary | ICD-10-CM | POA: Insufficient documentation

## 2017-09-18 DIAGNOSIS — M109 Gout, unspecified: Secondary | ICD-10-CM | POA: Insufficient documentation

## 2017-09-18 DIAGNOSIS — K5730 Diverticulosis of large intestine without perforation or abscess without bleeding: Secondary | ICD-10-CM | POA: Insufficient documentation

## 2017-09-18 DIAGNOSIS — Z7901 Long term (current) use of anticoagulants: Secondary | ICD-10-CM | POA: Insufficient documentation

## 2017-09-18 DIAGNOSIS — Z794 Long term (current) use of insulin: Secondary | ICD-10-CM | POA: Diagnosis not present

## 2017-09-18 DIAGNOSIS — E785 Hyperlipidemia, unspecified: Secondary | ICD-10-CM | POA: Insufficient documentation

## 2017-09-18 DIAGNOSIS — Z79899 Other long term (current) drug therapy: Secondary | ICD-10-CM | POA: Insufficient documentation

## 2017-09-18 DIAGNOSIS — E1151 Type 2 diabetes mellitus with diabetic peripheral angiopathy without gangrene: Secondary | ICD-10-CM | POA: Insufficient documentation

## 2017-09-18 DIAGNOSIS — K921 Melena: Secondary | ICD-10-CM | POA: Diagnosis not present

## 2017-09-18 DIAGNOSIS — F0390 Unspecified dementia without behavioral disturbance: Secondary | ICD-10-CM | POA: Insufficient documentation

## 2017-09-18 DIAGNOSIS — M8580 Other specified disorders of bone density and structure, unspecified site: Secondary | ICD-10-CM | POA: Diagnosis not present

## 2017-09-18 DIAGNOSIS — D122 Benign neoplasm of ascending colon: Secondary | ICD-10-CM | POA: Diagnosis not present

## 2017-09-18 DIAGNOSIS — J449 Chronic obstructive pulmonary disease, unspecified: Secondary | ICD-10-CM | POA: Diagnosis not present

## 2017-09-18 DIAGNOSIS — D649 Anemia, unspecified: Secondary | ICD-10-CM | POA: Insufficient documentation

## 2017-09-18 DIAGNOSIS — G936 Cerebral edema: Principal | ICD-10-CM

## 2017-09-18 DIAGNOSIS — D124 Benign neoplasm of descending colon: Secondary | ICD-10-CM

## 2017-09-18 DIAGNOSIS — I10 Essential (primary) hypertension: Secondary | ICD-10-CM | POA: Insufficient documentation

## 2017-09-18 DIAGNOSIS — I4891 Unspecified atrial fibrillation: Secondary | ICD-10-CM | POA: Diagnosis not present

## 2017-09-18 HISTORY — PX: COLONOSCOPY WITH PROPOFOL: SHX5780

## 2017-09-18 HISTORY — PX: POLYPECTOMY: SHX5525

## 2017-09-18 LAB — GLUCOSE, CAPILLARY
Glucose-Capillary: 165 mg/dL — ABNORMAL HIGH (ref 65–99)
Glucose-Capillary: 159 mg/dL — ABNORMAL HIGH (ref 65–99)

## 2017-09-18 SURGERY — COLONOSCOPY WITH PROPOFOL
Anesthesia: Monitor Anesthesia Care

## 2017-09-18 MED ORDER — LACTATED RINGERS IV SOLN
INTRAVENOUS | Status: DC | PRN
  Administered 2017-09-18: 10:00:00 via INTRAVENOUS

## 2017-09-18 MED ORDER — PROPOFOL 500 MG/50ML IV EMUL
INTRAVENOUS | Status: DC | PRN
  Administered 2017-09-18: 100 ug/kg/min via INTRAVENOUS

## 2017-09-18 MED ORDER — PROPOFOL 10 MG/ML IV BOLUS
INTRAVENOUS | Status: DC | PRN
  Administered 2017-09-18 (×4): 20 mg via INTRAVENOUS
  Administered 2017-09-18: 10 mg via INTRAVENOUS
  Administered 2017-09-18 (×7): 20 mg via INTRAVENOUS
  Administered 2017-09-18: 10 mg via INTRAVENOUS
  Administered 2017-09-18 (×3): 20 mg via INTRAVENOUS

## 2017-09-18 MED ORDER — PHENYLEPHRINE HCL 10 MG/ML IJ SOLN
INTRAMUSCULAR | Status: DC | PRN
  Administered 2017-09-18 (×2): 40 ug via INTRAVENOUS
  Administered 2017-09-18 (×4): 60 ug via INTRAVENOUS

## 2017-09-18 NOTE — Op Note (Signed)
[facility] Patient Name: Christine [name] Procedure Date: 09/18/2017 10:06 AM MRN: [id number] Date of Birth: July 27, 1940 Attending MD: Norvel Richards , MD CSN: [id number] Age: 77 Admit Type: Outpatient Procedure:                Colonoscopy Indications:              Hematochezia Providers:                Norvel Richards, MD, Jeanann Lewandowsky. Sharon Seller, RN,                            Randa Spike, Technician Referring MD:              Medicines:                Propofol per Anesthesia Complications:            No immediate complications. Estimated Blood Loss:     Estimated blood loss was minimal. Procedure:                Pre-Anesthesia Assessment:                           - Prior to the procedure, a History and Physical                            was performed, and patient medications and                            allergies were reviewed. The patient's tolerance of                            previous anesthesia was also reviewed. The risks                            and benefits of the procedure and the sedation                            options and risks were discussed with the patient.                            All questions were answered, and informed consent                            was obtained. Prior Anticoagulants: The patient                            last took Eliquis (apixaban) 3 days prior to the                            procedure. ASA Grade Assessment: III - A patient                            with severe systemic disease. After reviewing the  risks and benefits, the patient was deemed in                            satisfactory condition to undergo the procedure.                           After obtaining informed consent, the colonoscope                            was passed under direct vision. Throughout the                            procedure, the patient's blood pressure, pulse, and                            oxygen  saturations were monitored continuously. The                            EC-[id number] ([id number]) scope was introduced through                            the and advanced to the the cecum, identified by                            appendiceal orifice and ileocecal valve. The                            colonoscopy was performed without difficulty. The                            patient tolerated the procedure well. The quality                            of the bowel preparation was adequate. Scope In: 10:38:15 AM Scope Out: 11:35:05 AM Scope Withdrawal Time: 0 hours 44 minutes 15 seconds  Total Procedure Duration: 0 hours 56 minutes 50 seconds  Findings:      The perianal and digital rectal examinations were normal.      Eight semi-pedunculated polyps were found in the descending colon and       ascending colon. The polyps were 8 to 14 mm in size. These polyps were       removed with a hot snare. Resection and retrieval were complete.       Estimated blood loss: none.      Four semi-pedunculated polyps were found in the descending colon and       ascending colon. The polyps were 4 to 7 mm in size. These polyps were       removed with a cold snare. Resection and retrieval were complete.       Estimated blood loss was minimal.      Scattered small-mouthed diverticula were found in the entire colon.       Estimated blood loss: none.      The exam was otherwise without abnormality on direct and retroflexion       views. Impression:               -  Eight 8 to 14 mm polyps in the descending colon                            and in the ascending colon, removed with a hot                            snare. Resected and retrieved.                           - Four 4 to 7 mm polyps in the descending colon and                            in the ascending colon, removed with a cold snare.                            Resected and retrieved.                           - Diverticulosis in the entire examined  colon.                           - The examination was otherwise normal on direct                            and retroflexion views. Moderate Sedation:      Moderate (conscious) sedation was personally administered by an       anesthesia professional. The following parameters were monitored: oxygen       saturation, heart rate, blood pressure, respiratory rate, EKG, adequacy       of pulmonary ventilation, and response to care. Total physician       intraservice time was 66 minutes. Recommendation:           - Patient has a contact number available for                            emergencies. The signs and symptoms of potential                            delayed complications were discussed with the                            patient. Return to normal activities tomorrow.                            Written discharge instructions were provided to the                            patient.                           - Resume previous diet.                           - Continue present medications. Resume Eliquis May  24 th, 2019                           - Await pathology results.                           - Repeat colonoscopy (date not yet determined) for                            surveillance.                           - Return to GI office after studies are complete. Procedure Code(s):        --- Professional ---                           [phone], Colonoscopy, flexible; with removal of                            tumor(s), polyp(s), or other lesion(s) by snare                            technique Diagnosis Code(s):        --- Professional ---                           D12.4, Benign neoplasm of descending colon                           D12.2, Benign neoplasm of ascending colon                           K92.1, Melena (includes Hematochezia)                           K57.30, Diverticulosis of large intestine without                            perforation or abscess  without bleeding CPT copyright 2017 [employer]. All rights reserved. The codes documented in this report are preliminary and upon coder review may  be revised to meet current compliance requirements. Cristopher Estimable. Roshawn Ayala, MD Norvel Richards, MD 09/18/2017 11:46:12 AM This report has been signed electronically. Number of Addenda: 0

## 2017-09-18 NOTE — Discharge Instructions (Signed)
Colonoscopy Discharge Instructions  Read the instructions outlined below and refer to this sheet in the next few weeks. These discharge instructions provide you with general information on caring for yourself after you leave the hospital. Your doctor may also give you specific instructions. While your treatment has been planned according to the most current medical practices available, unavoidable complications occasionally occur. If you have any problems or questions after discharge, call Dr. Gala Romney at [phone]. ACTIVITY  You may resume your regular activity, but move at a slower pace for the next 24 hours.   Take frequent rest periods for the next 24 hours.   Walking will help get rid of the air and reduce the bloated feeling in your belly (abdomen).   No driving for 24 hours (because of the medicine (anesthesia) used during the test).    Do not sign any important legal documents or operate any machinery for 24 hours (because of the anesthesia used during the test).  NUTRITION  Drink plenty of fluids.   You may resume your normal diet as instructed by your doctor.   Begin with a light meal and progress to your normal diet. Heavy or fried foods are harder to digest and may make you feel sick to your stomach (nauseated).   Avoid alcoholic beverages for 24 hours or as instructed.  MEDICATIONS  You may resume your normal medications unless your doctor tells you otherwise.  WHAT YOU CAN EXPECT TODAY  Some feelings of bloating in the abdomen.   Passage of more gas than usual.   Spotting of blood in your stool or on the toilet paper.  IF YOU HAD POLYPS REMOVED DURING THE COLONOSCOPY:  No aspirin products for 7 days or as instructed.   No alcohol for 7 days or as instructed.   Eat a soft diet for the next 24 hours.  FINDING OUT THE RESULTS OF YOUR TEST Not all test results are available during your visit. If your test results are not back during the visit, make an appointment  with your caregiver to find out the results. Do not assume everything is normal if you have not heard from your caregiver or the medical facility. It is important for you to follow up on all of your test results.  SEEK IMMEDIATE MEDICAL ATTENTION IF:  You have more than a spotting of blood in your stool.   Your belly is swollen (abdominal distention).   You are nauseated or vomiting.   You have a temperature over 101.   You have abdominal pain or discomfort that is severe or gets worse throughout the day.    Diverticulosis information provided  Polyp information provided  Wait until May 24 until resuming Eliquis  Further recommendations to follow pending review of pathology report  Moderate Conscious Sedation, Adult, Care After These instructions provide you with information about caring for yourself after your procedure. Your health care provider may also give you more specific instructions. Your treatment has been planned according to current medical practices, but problems sometimes occur. Call your health care provider if you have any problems or questions after your procedure. What can I expect after the procedure? After your procedure, it is common:  To feel sleepy for several hours.  To feel clumsy and have poor balance for several hours.  To have poor judgment for several hours.  To vomit if you eat too soon.  Follow these instructions at home: For at least 24 hours after the procedure:   Do not: ?  Participate in activities where you could fall or become injured. ? Drive. ? Use heavy machinery. ? Drink alcohol. ? Take sleeping pills or medicines that cause drowsiness. ? Make important decisions or sign legal documents. ? Take care of children on your own.  Rest. Eating and drinking  Follow the diet recommended by your health care provider.  If you vomit: ? Drink water, juice, or soup when you can drink without vomiting. ? Make sure you have little or no  nausea before eating solid foods. General instructions  Have a responsible adult stay with you until you are awake and alert.  Take over-the-counter and prescription medicines only as told by your health care provider.  If you smoke, do not smoke without supervision.  Keep all follow-up visits as told by your health care provider. This is important. Contact a health care provider if:  You keep feeling nauseous or you keep vomiting.  You feel light-headed.  You develop a rash.  You have a fever. Get help right away if:  You have trouble breathing. This information is not intended to replace advice given to you by your health care provider. Make sure you discuss any questions you have with your health care provider. Document Released: 02/06/2013 Document Revised: 09/21/2015 Document Reviewed: 08/08/2015 Elsevier Interactive Patient Education  [employer].

## 2017-09-18 NOTE — Anesthesia Postprocedure Evaluation (Signed)
Anesthesia Post Note  Patient: Christine [name]  Procedure(s) Performed: COLONOSCOPY WITH PROPOFOL (N/A ) POLYPECTOMY  Patient location during evaluation: PACU Anesthesia Type: MAC Level of consciousness: awake and alert and oriented Pain management: pain level controlled Vital Signs Assessment: post-procedure vital signs reviewed and stable Respiratory status: spontaneous breathing and patient connected to nasal cannula oxygen Cardiovascular status: stable Postop Assessment: no apparent nausea or vomiting Anesthetic complications: no     Last Vitals:  Vitals:   09/18/17 0939  BP: 110/68  Pulse: 72  Resp: 17  Temp: 36.8 C  SpO2: 93%    Last Pain:  Vitals:   09/18/17 1026  TempSrc:   PainSc: 0-No pain                 ADAMS, AMY A

## 2017-09-18 NOTE — Interval H&P Note (Signed)
History and Physical Interval Note:  09/18/2017 10:13 AM  Christine [name]  has presented today for surgery, with the diagnosis of rectal bleeding, anemia  The various methods of treatment have been discussed with the patient and family. After consideration of risks, benefits and other options for treatment, the patient has consented to  Procedure(s) with comments: COLONOSCOPY WITH PROPOFOL (N/A) - 11:00am as a surgical intervention .  The patient's history has been reviewed, patient examined, no change in status, stable for surgery.  I have reviewed the patient's chart and labs.  Questions were answered to the patient's satisfaction.     Christine [name]  No change. Anticoagulation held 2 days ago. Diagnostic colonoscopy per plan.  The risks, benefits, limitations, alternatives and imponderables have been reviewed with the patient. Questions have been answered. All parties are agreeable.

## 2017-09-18 NOTE — Transfer of Care (Signed)
Immediate Anesthesia Transfer of Care Note  Patient: Christine [name]  Procedure(s) Performed: COLONOSCOPY WITH PROPOFOL (N/A ) POLYPECTOMY  Patient Location: PACU  Anesthesia Type:MAC  Level of Consciousness: awake, alert  and patient cooperative  Airway & Oxygen Therapy: Patient Spontanous Breathing and Patient connected to nasal cannula oxygen  Post-op Assessment: Report given to RN and Post -op Vital signs reviewed and stable  Post vital signs: Reviewed and stable  Last Vitals:  Vitals Value Taken Time  BP 94/44 09/18/2017 11:45 AM  Temp    Pulse 38 09/18/2017 11:46 AM  Resp 26 09/18/2017 11:47 AM  SpO2 81 % 09/18/2017 11:46 AM  Vitals shown include unvalidated device data.  Last Pain:  Vitals:   09/18/17 1026  TempSrc:   PainSc: 0-No pain         Complications: No apparent anesthesia complications

## 2017-09-18 NOTE — Anesthesia Preprocedure Evaluation (Signed)
Anesthesia Evaluation  Patient identified by MRN, date of birth, ID band Patient awake    Reviewed: Allergy & Precautions, H&P , NPO status , Patient's Chart, lab work & pertinent test results, reviewed documented beta blocker date and time   Airway Mallampati: II  TM Distance: >3 FB Neck ROM: full    Dental no notable dental hx.    Pulmonary neg pulmonary ROS, COPD,    Pulmonary exam normal breath sounds clear to auscultation       Cardiovascular Exercise Tolerance: Good hypertension, negative cardio ROS  Atrial Fibrillation  Rhythm:regular Rate:Normal     Neuro/Psych  Headaches, PSYCHIATRIC DISORDERS Anxiety Depression Dementia negative neurological ROS  negative psych ROS   GI/Hepatic negative GI ROS, Neg liver ROS,   Endo/Other  negative endocrine ROSdiabetes  Renal/GU negative Renal ROS  negative genitourinary   Musculoskeletal   Abdominal   Peds  Hematology negative hematology ROS (+) anemia ,   Anesthesia Other Findings Diabetic neuropathy  Reproductive/Obstetrics negative OB ROS                             Anesthesia Physical Anesthesia Plan  ASA: III  Anesthesia Plan: MAC   Post-op Pain Management:    Induction:   PONV Risk Score and Plan:   Airway Management Planned:   Additional Equipment:   Intra-op Plan:   Post-operative Plan:   Informed Consent: I have reviewed the patients History and Physical, chart, labs and discussed the procedure including the risks, benefits and alternatives for the proposed anesthesia with the patient or authorized representative who has indicated his/her understanding and acceptance.   Dental Advisory Given  Plan Discussed with: CRNA  Anesthesia Plan Comments:         Anesthesia Quick Evaluation

## 2017-09-19 ENCOUNTER — Encounter: Payer: Self-pay | Admitting: Internal Medicine

## 2017-09-21 ENCOUNTER — Encounter (HOSPITAL_COMMUNITY): Payer: Self-pay | Admitting: Internal Medicine

## 2017-10-05 ENCOUNTER — Non-Acute Institutional Stay (SKILLED_NURSING_FACILITY): Payer: Medicare Other | Admitting: Internal Medicine

## 2017-10-05 ENCOUNTER — Encounter: Payer: Self-pay | Admitting: Internal Medicine

## 2017-10-05 DIAGNOSIS — R600 Localized edema: Secondary | ICD-10-CM | POA: Diagnosis not present

## 2017-10-05 DIAGNOSIS — I482 Chronic atrial fibrillation, unspecified: Secondary | ICD-10-CM

## 2017-10-05 DIAGNOSIS — L03115 Cellulitis of right lower limb: Secondary | ICD-10-CM | POA: Diagnosis not present

## 2017-10-05 DIAGNOSIS — E1140 Type 2 diabetes mellitus with diabetic neuropathy, unspecified: Secondary | ICD-10-CM

## 2017-10-05 DIAGNOSIS — Z794 Long term (current) use of insulin: Secondary | ICD-10-CM | POA: Diagnosis not present

## 2017-10-05 NOTE — Progress Notes (Signed)
Location:  [facility] of Service:  SNF (31) Provider:   Celedonio Savage, MD  Patient Care Team: Celedonio Savage, MD as PCP - General (Family Medicine) Rothbart, Cristopher Estimable, MD (Cardiology) Lendon Colonel, NP as Nurse Practitioner (Nurse Practitioner)  Extended Emergency Contact Information Primary Emergency Contact: [facility] Address: [address]          [place], [place] [address] [name] of [place] Phone: [phone] Mobile Phone: [phone] Relation: Daughter  Code Status:  DNR Goals of care: Advanced Directive information Advanced Directives 09/14/2017  Does Patient Have a Medical Advance Directive? Yes  Type of Advance Directive Out of facility DNR (pink MOST or yellow form)  Does patient want to make changes to medical advance directive? No - Patient declined  Copy of [place] in Chart? No - copy requested  Pre-existing out of facility DNR order (yellow form or pink MOST form) -     Chief Complaint  Patient presents with  . Acute Visit    LE edema and Redness    HPI:  Pt is a 77 y.o. female seen today for an acute visit for Worsening Swelling for her Right LE with Redness.  Patient has h/o Diabetes mellitus type 2 , atrial fibrillation on chronic anticoagulation, Venous stasis, Dementia with Behavior problems, S/P fall leading to Ocular floor fracture. Cellulitis oF LE.and recent GI bleed s/p Colonoscopy with Polypectomy.  Patient seen today as Nurses noticed increase swelling in Right LE with redness concern for Cellulitis.Patient unable to give any history. She denied any complains of pain in that leg. No fever or chills. No Cough or Chest pain or SOB    Past Medical History:  Diagnosis Date  . Allergy   . Atrial fibrillation ([place]) 08/2011   First diagnosed in 08/2011; duration of arrhythmia is uncertain  . Bilateral lower extremity edema   . Chronic diarrhea    diverticulosis  . COPD (chronic obstructive  pulmonary disease) ([place])   . Dementia   . Diabetes mellitus, type 2 (HCC)    Diabetic neuropathy  . Dyspnea on exertion    pedal edema  . Gout   . Headache(784.0)    twice weekly  . Hyperlipidemia   . Hypertension   . Osteopenia    DEXA scan 01/2010  . Palpitations   . Seasonal allergies   . Stress incontinence   . Vertigo    Past Surgical History:  Procedure Laterality Date  . APPENDECTOMY    . BREAST BIOPSY  2002   Right  . CESAREAN SECTION     x 2  . CHOLECYSTECTOMY    . COLONOSCOPY  2007   Negative screening study  . COLONOSCOPY WITH PROPOFOL N/A 09/18/2017   Procedure: COLONOSCOPY WITH PROPOFOL;  Surgeon: Daneil Dolin, MD;  [facility];  Service: Endoscopy;  Laterality: N/A;  11:00am  . HAMMER TOE SURGERY     Bilateral hammer toe amputation  . INCISIONAL HERNIA REPAIR    . KNEE ARTHROSCOPY W/ MENISCAL REPAIR  2007   Bilateral  . POLYPECTOMY  09/18/2017   Procedure: POLYPECTOMY;  Surgeon: Daneil Dolin, MD;  [facility];  Service: Endoscopy;;  colon  . UMBILICAL HERNIA REPAIR      Allergies  Allergen Reactions  . Codeine Anaphylaxis and Hives  . Morphine And Related Anaphylaxis and Hives  . Penicillins Anaphylaxis  . Ace Inhibitors Cough    Outpatient Encounter Medications as of 10/05/2017  Medication Sig  . acetaminophen (TYLENOL) 325 MG tablet Take 650 mg by mouth every 6 (six) hours as needed.   Marland Kitchen alendronate (FOSAMAX) 70 MG tablet Give 1 tablet by mouth on Sunday. Take with a full glass of water on an empty stomach.  Marland Kitchen apixaban (ELIQUIS) 5 MG TABS tablet Take 5 mg by mouth 2 (two) times daily.   . carboxymethylcellulose (REFRESH PLUS) 0.5 % SOLN Place 1 drop into both eyes 4 (four) times daily.  . cholecalciferol (VITAMIN D) 1000 UNITS tablet Take 1,000 Units by mouth daily.  . citalopram (CELEXA) 20 MG tablet Take 1 tablet (20 mg total) by mouth daily.  . digoxin (LANOXIN) 0.125 MG tablet Take 1 tablet by mouth once a day (HOLD  FOR AP UNDER 60)  . furosemide (LASIX) 20 MG tablet Take 60 mg by mouth daily.   . insulin NPH-regular Human (NOVOLIN 70/30) (70-30) 100 UNIT/ML injection Inject 30 Units into the skin daily with supper. Give 30 units subcutaneous once a evening   . insulin NPH-regular Human (NOVOLIN 70/30) (70-30) 100 UNIT/ML injection Inject 55 Units into the skin daily with breakfast. Give 55 units subcutaneous one a morning  . Insulin Pen Needle (EASY TOUCH PEN NEEDLES) 31G X 8 MM MISC Use twice a day  . Lactobacillus Rhamnosus, GG, ([employer] PROBIOTIC, LACTOBACILLUS, PO) Take 1 capsule by mouth daily. Give 1 tablet by mouth once a day   . losartan (COZAAR) 25 MG tablet Take 25 mg by mouth daily.  . memantine (NAMENDA) 10 MG tablet Take 10 mg by mouth 2 (two) times daily.  . metoprolol tartrate (LOPRESSOR) 25 MG tablet Take 75 mg by mouth 2 (two) times daily.  . Multiple Vitamin (MULTIVITAMIN WITH MINERALS) TABS Take 1 tablet by mouth every morning.  . pantoprazole (PROTONIX) 40 MG tablet Take 40 mg by mouth daily.  . polyethylene glycol (MIRALAX / GLYCOLAX) packet Take 17 g by mouth daily as needed.   . potassium chloride (MICRO-K) 10 MEQ CR capsule Take 20 mEq by mouth daily. Potassium chloride extended release capsule, give 20 meq by mouth once day  . pravastatin (PRAVACHOL) 20 MG tablet Take 20 mg by mouth daily. Take along with 40 mg to = 60 mg  . pravastatin (PRAVACHOL) 40 MG tablet Take 40 mg by mouth daily. Take along with 20 mg to = 60 mg  . sitaGLIPtin-metformin (JANUMET) 50-500 MG tablet Take 1 tablet by mouth 2 (two) times daily with a meal.  . [DISCONTINUED] doxycycline (DORYX) 100 MG EC tablet Take 100 mg by mouth 2 (two) times daily. Take for 10 days from 09/11/2017-09/24/2017  . [DISCONTINUED] K-DUR 20 MEQ tablet TAKE 2 TABLETS BY MOUTH ONCE DAILY.   No facility-administered encounter medications on file as of 10/05/2017.     Review of Systems  Unable to perform ROS: Dementia    Immunization  History  Administered Date(s) Administered  . Influenza-Unspecified 02/04/2016, 02/03/2017  . Pneumococcal Conjugate-13 02/03/2017  . Pneumococcal-Unspecified 02/08/2016  . Tdap 01/19/2017   Pertinent  Health Maintenance Due  Topic Date Due  . URINE MICROALBUMIN  10/15/2017 (Originally 05/11/2017)  . HEMOGLOBIN A1C  11/13/2017  . INFLUENZA VACCINE  11/30/2017  . FOOT EXAM  01/25/2018  . OPHTHALMOLOGY EXAM  02/22/2018  . DEXA SCAN  Completed  . PNA vac Low Risk Adult  Completed   Fall Risk  01/19/2017 11/06/2015 09/29/2015 08/31/2015 06/30/2015  Falls in the past year? No Yes No No No  Number falls in past yr: -  2 or more - - -  Injury with Fall? - No - - -   Functional Status Survey:    Vitals:   10/05/17 1604  BP: 98/62  Pulse: 78  Resp: 18  Temp: 98.4 F (36.9 C)   There is no height or weight on file to calculate BMI. Physical Exam  Constitutional: She appears well-developed and well-nourished.  HENT:  Head: Normocephalic.  Eyes: Pupils are equal, round, and reactive to light.  Neck: Neck supple.  Cardiovascular: Normal rate. An irregular rhythm present.  No murmur heard. Pulmonary/Chest: Effort normal and breath sounds normal. No respiratory distress. She has no wheezes.  Abdominal: Soft. She exhibits no distension. There is no tenderness. There is no guarding.  Musculoskeletal:  Right sided edema and Redness especially in the back of Shin with warmth Left side Mild Edema  Neurological: She is alert.  Skin: Skin is warm and dry.  Psychiatric: She has a normal mood and affect. Her behavior is normal. Thought content normal.    Labs reviewed: Recent Labs    08/08/17 1459 08/08/17 1715 08/11/17 0730  NA 135 137 139  K 4.1 4.0 3.9  CL 98* 99* 102  CO2 25 27 27   GLUCOSE 189* 208* 163*  BUN 25* 26* 23*  CREATININE 1.13* 1.08* 0.94  CALCIUM 8.9 9.0 8.8*   Recent Labs    11/14/16 0730 08/08/17 1459  AST 15 21  ALT 15 15  ALKPHOS 48 54  BILITOT 0.7 0.5    PROT 7.2 7.5  ALBUMIN 3.2* 3.3*   Recent Labs    08/08/17 1715 08/11/17 0730 08/21/17 0330 09/04/17 0612  WBC 9.2 7.5 8.7 10.0  NEUTROABS 6.3 4.9 4.8  --   HGB 10.3* 10.3* 10.8* 10.2*  HCT 33.6* 33.1* 36.1 33.3*  MCV 93.3 92.5 94.3 91.7  PLT 241 254 242 222   Lab Results  Component Value Date   TSH 2.822 08/08/2017   Lab Results  Component Value Date   HGBA1C 6.4 (H) 05/16/2017   Lab Results  Component Value Date   CHOL 156 05/16/2017   HDL 38 (L) 05/16/2017   LDLCALC 104 (H) 05/16/2017   TRIG 72 05/16/2017   CHOLHDL 4.1 05/16/2017    Significant Diagnostic Results in last 30 days:  No results found.  Assessment/Plan Right LE Cellulitis Start on Doxycycline 100 mg BID for 7 days Monitor Vitals.  LE edema Most likely cause of her recurrent Cellulitis Though her weight is 211 lbs close to her weight of 212 Will start her on Metolazone 2.5 mg 3/Week Decrease Lasix to 40 mg QD Low BP Will discontinue Losartan especially since starting her on Metalazone Lower GI bleed S/P Colonoscopy Continue on Eliquis for Atrial fibrillation Follow up of CBC Diabetes Mellitus BS are controlled Repeat A1C Family/ staff Communication:   Labs/tests ordered:  BMP and CBC

## 2017-10-09 ENCOUNTER — Non-Acute Institutional Stay (SKILLED_NURSING_FACILITY): Payer: Medicare Other | Admitting: Internal Medicine

## 2017-10-09 ENCOUNTER — Encounter: Payer: Self-pay | Admitting: Internal Medicine

## 2017-10-09 DIAGNOSIS — Z794 Long term (current) use of insulin: Secondary | ICD-10-CM

## 2017-10-09 DIAGNOSIS — I482 Chronic atrial fibrillation, unspecified: Secondary | ICD-10-CM

## 2017-10-09 DIAGNOSIS — E1140 Type 2 diabetes mellitus with diabetic neuropathy, unspecified: Secondary | ICD-10-CM | POA: Diagnosis not present

## 2017-10-09 DIAGNOSIS — R600 Localized edema: Secondary | ICD-10-CM | POA: Diagnosis not present

## 2017-10-09 DIAGNOSIS — L03115 Cellulitis of right lower limb: Secondary | ICD-10-CM

## 2017-10-09 NOTE — Progress Notes (Signed)
Location:   [facility] Room Number: 113/W Place of Service:  SNF [id number]) Provider:  Jefm Bryant, MD  Patient Care Team: Celedonio Savage, MD as PCP - General (Family Medicine) Rothbart, Cristopher Estimable, MD (Cardiology) Lendon Colonel, NP as Nurse Practitioner (Nurse Practitioner)  Extended Emergency Contact Information Primary Emergency Contact: [facility] Address: [address]          [place], [place] [address] [name] of [place] Phone: [phone] Mobile Phone: [phone] Relation: Daughter  Code Status:  DNR Goals of care: Advanced Directive information Advanced Directives 10/09/2017  Does Patient Have a Medical Advance Directive? Yes  Type of Advance Directive Out of facility DNR (pink MOST or yellow form)  Does patient want to make changes to medical advance directive? No - Patient declined  Copy of [place] in Chart? No - copy requested  Pre-existing out of facility DNR order (yellow form or pink MOST form) -     Chief Complaint  Patient presents with  . Acute Visit    Patient being seen for F/U Celluslitis     HPI:  Pt is a 77 y.o. female seen today for an acute visit for follow-up of right cellulitisLE .  Patient has h/o Diabetes mellitus type 2 , atrial fibrillation on chronic anticoagulation, Venous stasis, Dementia with Behavior problems, S/P fall leading to Ocular floor fracture.Cellulitis oF LE.and recent GI bleed s/p Colonoscopy with Polypectomy.  Patient was seen few days ago and was started on doxycycline for Right LE Cellulitis. This is a follow-up visit.  Her redness and pain is much better.  She was also started on metolazone.  And has lost 5 lbs. She continues to deny any complaints of fever or chills, shortness of breath no chest pain  Past Medical History:  Diagnosis Date  . Allergy   . Atrial fibrillation ([place]) 08/2011   First diagnosed in 08/2011; duration of arrhythmia is  uncertain  . Bilateral lower extremity edema   . Chronic diarrhea    diverticulosis  . COPD (chronic obstructive pulmonary disease) ([place])   . Dementia   . Diabetes mellitus, type 2 (HCC)    Diabetic neuropathy  . Dyspnea on exertion    pedal edema  . Gout   . Headache(784.0)    twice weekly  . Hyperlipidemia   . Hypertension   . Osteopenia    DEXA scan 01/2010  . Palpitations   . Seasonal allergies   . Stress incontinence   . Vertigo    Past Surgical History:  Procedure Laterality Date  . APPENDECTOMY    . BREAST BIOPSY  2002   Right  . CESAREAN SECTION     x 2  . CHOLECYSTECTOMY    . COLONOSCOPY  2007   Negative screening study  . COLONOSCOPY WITH PROPOFOL N/A 09/18/2017   Procedure: COLONOSCOPY WITH PROPOFOL;  Surgeon: Daneil Dolin, MD;  [facility];  Service: Endoscopy;  Laterality: N/A;  11:00am  . HAMMER TOE SURGERY     Bilateral hammer toe amputation  . INCISIONAL HERNIA REPAIR    . KNEE ARTHROSCOPY W/ MENISCAL REPAIR  2007   Bilateral  . POLYPECTOMY  09/18/2017   Procedure: POLYPECTOMY;  Surgeon: Daneil Dolin, MD;  [facility];  Service: Endoscopy;;  colon  . UMBILICAL HERNIA REPAIR      Allergies  Allergen Reactions  . Codeine Anaphylaxis and Hives  . Morphine And Related Anaphylaxis and Hives  .  Penicillins Anaphylaxis  . Ace Inhibitors Cough    Outpatient Encounter Medications as of 10/09/2017  Medication Sig  . acetaminophen (TYLENOL) 325 MG tablet Take 650 mg by mouth every 6 (six) hours as needed.   Marland Kitchen alendronate (FOSAMAX) 70 MG tablet Give 1 tablet by mouth on Sunday. Take with a full glass of water on an empty stomach.  Marland Kitchen apixaban (ELIQUIS) 5 MG TABS tablet Take 5 mg by mouth 2 (two) times daily.   . carboxymethylcellulose (REFRESH PLUS) 0.5 % SOLN Place 1 drop into both eyes 4 (four) times daily.  . cholecalciferol (VITAMIN D) 1000 UNITS tablet Take 1,000 Units by mouth daily.  . citalopram (CELEXA) 20 MG tablet  Take 1 tablet (20 mg total) by mouth daily.  . digoxin (LANOXIN) 0.125 MG tablet Take 1 tablet by mouth once a day (HOLD FOR AP UNDER 60)  . doxycycline (VIBRAMYCIN) 100 MG capsule Take 100 mg by mouth 2 (two) times daily.  . furosemide (LASIX) 20 MG tablet Take 40 mg by mouth daily.   . insulin NPH-regular Human (NOVOLIN 70/30) (70-30) 100 UNIT/ML injection Inject 30 Units into the skin daily with supper.   . insulin NPH-regular Human (NOVOLIN 70/30) (70-30) 100 UNIT/ML injection Inject 55 Units into the skin daily with breakfast.   . Insulin Pen Needle (EASY TOUCH PEN NEEDLES) 31G X 8 MM MISC Use twice a day  . Lactobacillus Rhamnosus, GG, ([employer]S PROBIOTIC, LACTOBACILLUS, PO) Take 1 capsule by mouth daily. Give 1 tablet by mouth once a day   . memantine (NAMENDA) 10 MG tablet Take 10 mg by mouth 2 (two) times daily.  . metolazone (ZAROXOLYN) 2.5 MG tablet Take 2.5 mg by mouth daily. On Mon., Wed Fri  . metoprolol tartrate (LOPRESSOR) 25 MG tablet Take 75 mg by mouth 2 (two) times daily.  . Multiple Vitamin (MULTIVITAMIN WITH MINERALS) TABS Take 1 tablet by mouth every morning.  . pantoprazole (PROTONIX) 40 MG tablet Take 40 mg by mouth daily.  . potassium chloride (MICRO-K) 10 MEQ CR capsule Take 20 mEq by mouth daily. Potassium chloride extended release capsule, give 20 meq by mouth once day  . pravastatin (PRAVACHOL) 20 MG tablet Take 20 mg by mouth daily. Take along with 40 mg to = 60 mg  . pravastatin (PRAVACHOL) 40 MG tablet Take 40 mg by mouth daily. Take along with 20 mg to = 60 mg  . sitaGLIPtin-metformin (JANUMET) 50-500 MG tablet Take 1 tablet by mouth 2 (two) times daily with a meal.  . [DISCONTINUED] K-DUR 20 MEQ tablet TAKE 2 TABLETS BY MOUTH ONCE DAILY.  . [DISCONTINUED] losartan (COZAAR) 25 MG tablet Take 25 mg by mouth daily.  . [DISCONTINUED] polyethylene glycol (MIRALAX / GLYCOLAX) packet Take 17 g by mouth daily as needed.    No facility-administered encounter medications on  file as of 10/09/2017.      Review of Systems  Unable to perform ROS: Dementia    Immunization History  Administered Date(s) Administered  . Influenza-Unspecified 02/04/2016, 02/03/2017  . Pneumococcal Conjugate-13 02/03/2017  . Pneumococcal-Unspecified 02/08/2016  . Tdap 01/19/2017   Pertinent  Health Maintenance Due  Topic Date Due  . URINE MICROALBUMIN  10/15/2017 (Originally 05/11/2017)  . HEMOGLOBIN A1C  11/13/2017  . INFLUENZA VACCINE  11/30/2017  . FOOT EXAM  01/25/2018  . OPHTHALMOLOGY EXAM  02/22/2018  . DEXA SCAN  Completed  . PNA vac Low Risk Adult  Completed   Fall Risk  01/19/2017 11/06/2015 09/29/2015 08/31/2015 06/30/2015  Falls in the past year? No Yes No No No  Number falls in past yr: - 2 or more - - -  Injury with Fall? - No - - -   Functional Status Survey:    Vitals:   10/09/17 1209  BP: 110/67  Pulse: 83  Resp: 20  Temp: 98 F (36.7 C)  TempSrc: Oral  Weight: 209 lb (94.8 kg)   Body mass index is 33.73 kg/m. Physical Exam  Constitutional: She appears well-developed and well-nourished.  HENT:  Head: Normocephalic.  Mouth/Throat: Oropharynx is clear and moist.  Eyes: Pupils are equal, round, and reactive to light.  Neck: Neck supple.  Cardiovascular: Normal rate and normal heart sounds.  Pulmonary/Chest: Effort normal and breath sounds normal. No stridor. No respiratory distress. She has no wheezes.  Abdominal: Soft. Bowel sounds are normal. She exhibits no distension. There is no tenderness. There is no guarding.  Musculoskeletal:  Positive for Edema and some redness in her LE but it is improved.  Neurological: She is alert.  Skin: Skin is warm.  Psychiatric: She has a normal mood and affect. Her behavior is normal.    Labs reviewed: Recent Labs    08/08/17 1459 08/08/17 1715 08/11/17 0730  NA 135 137 139  K 4.1 4.0 3.9  CL 98* 99* 102  CO2 25 27 27   GLUCOSE 189* 208* 163*  BUN 25* 26* 23*  CREATININE 1.13* 1.08* 0.94  CALCIUM 8.9  9.0 8.8*   Recent Labs    11/14/16 0730 08/08/17 1459  AST 15 21  ALT 15 15  ALKPHOS 48 54  BILITOT 0.7 0.5  PROT 7.2 7.5  ALBUMIN 3.2* 3.3*   Recent Labs    08/08/17 1715 08/11/17 0730 08/21/17 0330 09/04/17 0612  WBC 9.2 7.5 8.7 10.0  NEUTROABS 6.3 4.9 4.8  --   HGB 10.3* 10.3* 10.8* 10.2*  HCT 33.6* 33.1* 36.1 33.3*  MCV 93.3 92.5 94.3 91.7  PLT 241 254 242 222   Lab Results  Component Value Date   TSH 2.822 08/08/2017   Lab Results  Component Value Date   HGBA1C 6.4 (H) 05/16/2017   Lab Results  Component Value Date   CHOL 156 05/16/2017   HDL 38 (L) 05/16/2017   LDLCALC 104 (H) 05/16/2017   TRIG 72 05/16/2017   CHOLHDL 4.1 05/16/2017    Significant Diagnostic Results in last 30 days:  No results found.  Assessment/Plan  Right LE Cellulitis Patient much improved on doxycycline. She is finishing the course.  LE edema  she has lost some weight and her edema is better On metolazone. We will continue and repeat BMP is pending Low BP Blood pressure much better since losartan was discontinued Lower GI bleed S/P Colonoscopy Continue on Eliquis for Atrial fibrillation Repeat CBC is pending Diabetes Mellitus BS are controlled Repeat A1C is pending    Family/ staff Communication:   Labs/tests ordered:   Total time spent in this patient care encounter was 25_ minutes; greater than 50% of the visit spent counseling patient, reviewing records , Labs and coordinating care for problems addressed at this encounter.

## 2017-10-10 ENCOUNTER — Encounter: Payer: Self-pay | Admitting: Internal Medicine

## 2017-10-12 ENCOUNTER — Encounter (HOSPITAL_COMMUNITY)
Admission: RE | Admit: 2017-10-12 | Discharge: 2017-10-12 | Disposition: A | Discharge status: Home / Self Care | Payer: Medicare Other | Source: Skilled Nursing Facility | Attending: Internal Medicine | Admitting: Internal Medicine

## 2017-10-12 DIAGNOSIS — F329 Major depressive disorder, single episode, unspecified: Secondary | ICD-10-CM | POA: Insufficient documentation

## 2017-10-12 DIAGNOSIS — F0390 Unspecified dementia without behavioral disturbance: Secondary | ICD-10-CM | POA: Insufficient documentation

## 2017-10-12 DIAGNOSIS — I5042 Chronic combined systolic (congestive) and diastolic (congestive) heart failure: Secondary | ICD-10-CM | POA: Insufficient documentation

## 2017-10-12 DIAGNOSIS — I872 Venous insufficiency (chronic) (peripheral): Secondary | ICD-10-CM | POA: Insufficient documentation

## 2017-10-12 LAB — BASIC METABOLIC PANEL
Anion gap: 14 (ref 5–15)
BUN: 44 mg/dL — AB (ref 6–20)
CALCIUM: 9.7 mg/dL (ref 8.9–10.3)
CO2: 29 mmol/L (ref 22–32)
CREATININE: 1.22 mg/dL — AB (ref 0.44–1.00)
Chloride: 97 mmol/L — ABNORMAL LOW (ref 101–111)
GFR calc Af Amer: 48 mL/min — ABNORMAL LOW (ref >60)
GFR calc non Af Amer: 42 mL/min — ABNORMAL LOW (ref >60)
GLUCOSE: 138 mg/dL — AB (ref 65–99)
Potassium: 3.8 mmol/L (ref 3.5–5.1)
Sodium: 140 mmol/L (ref 135–145)

## 2017-10-12 LAB — CBC
HEMATOCRIT: 40.8 % (ref 36.0–46.0)
HEMOGLOBIN: 12.7 g/dL (ref 12.0–15.0)
MCH: 28 pg (ref 26.0–34.0)
MCHC: 31.1 g/dL (ref 30.0–36.0)
MCV: 89.9 fL (ref 78.0–100.0)
Platelets: 270 10*3/uL (ref 150–400)
RBC: 4.54 MIL/uL (ref 3.87–5.11)
RDW: 14.3 % (ref 11.5–15.5)
WBC: 11.8 10*3/uL — ABNORMAL HIGH (ref 4.0–10.5)

## 2017-10-12 LAB — HEMOGLOBIN A1C
Hgb A1c MFr Bld: 7.4 % — ABNORMAL HIGH (ref 4.8–5.6)
Mean Plasma Glucose: 165.68 mg/dL

## 2017-10-14 DIAGNOSIS — M25562 Pain in left knee: Secondary | ICD-10-CM | POA: Diagnosis not present

## 2017-10-16 ENCOUNTER — Non-Acute Institutional Stay (SKILLED_NURSING_FACILITY): Payer: Medicare Other | Admitting: Internal Medicine

## 2017-10-16 ENCOUNTER — Encounter: Payer: Self-pay | Admitting: Internal Medicine

## 2017-10-16 DIAGNOSIS — D72829 Elevated white blood cell count, unspecified: Secondary | ICD-10-CM

## 2017-10-16 DIAGNOSIS — M1710 Unilateral primary osteoarthritis, unspecified knee: Secondary | ICD-10-CM | POA: Diagnosis not present

## 2017-10-16 DIAGNOSIS — L03115 Cellulitis of right lower limb: Secondary | ICD-10-CM

## 2017-10-16 DIAGNOSIS — R635 Abnormal weight gain: Secondary | ICD-10-CM | POA: Diagnosis not present

## 2017-10-16 NOTE — Progress Notes (Signed)
Location:   [place] Room Number: 113/W Place of Service:  SNF [phone]) Provider:  Dionicia Abler, MD  Patient Care Team: Celedonio Savage, MD as PCP - General (Family Medicine) Rothbart, Cristopher Estimable, MD (Cardiology) Lendon Colonel, NP as Nurse Practitioner (Nurse Practitioner)  Extended Emergency Contact Information Primary Emergency Contact: [facility] Address: [address]          [place], [place] [address] [name] of [place] Phone: [phone] Mobile Phone: [phone] Relation: Daughter  Code Status:  DNR Goals of care: Advanced Directive information Advanced Directives 10/16/2017  Does Patient Have a Medical Advance Directive? Yes  Type of Advance Directive Out of facility DNR (pink MOST or yellow form)  Does patient want to make changes to medical advance directive? No - Patient declined  Copy of [place] in Chart? No - copy requested  Pre-existing out of facility DNR order (yellow form or pink MOST form) -     Chief Complaint  Patient presents with  . Acute Visit    Patient being seen for F/U X-Ray  of left knee  HPI:  Pt is a 77 y.o. female seen today for an acute visit for follow-up of the x-ray of her left knee was ordered which is come back showing essentially arthritis no acute process.  Patient also was recently treated with doxycycline for recurrent right leg cellulitis this appears to be stable.  Currently she has no complaints says she has a little soreness at times of the left knee when she walks-otherwise does not complain of pain.  Patient does have a history of type 2 diabetes as well as A. fib-venous stasis edema-dementia- and recent GI bleed status post colonoscopy with polypectomy.  She apparently complained of some increased left knee pain over the weekend and thus the x-ray was obtained.   She was also recently started on Metolazone secondary to increased edema and weight gain  this appears to have moderated     Past Medical History:  Diagnosis Date  . Allergy   . Atrial fibrillation ([place]) 08/2011   First diagnosed in 08/2011; duration of arrhythmia is uncertain  . Bilateral lower extremity edema   . Chronic diarrhea    diverticulosis  . COPD (chronic obstructive pulmonary disease) ([place])   . Dementia   . Diabetes mellitus, type 2 (HCC)    Diabetic neuropathy  . Dyspnea on exertion    pedal edema  . Gout   . Headache(784.0)    twice weekly  . Hyperlipidemia   . Hypertension   . Osteopenia    DEXA scan 01/2010  . Palpitations   . Seasonal allergies   . Stress incontinence   . Vertigo    Past Surgical History:  Procedure Laterality Date  . APPENDECTOMY    . BREAST BIOPSY  2002   Right  . CESAREAN SECTION     x 2  . CHOLECYSTECTOMY    . COLONOSCOPY  2007   Negative screening study  . COLONOSCOPY WITH PROPOFOL N/A 09/18/2017   Procedure: COLONOSCOPY WITH PROPOFOL;  Surgeon: Daneil Dolin, MD;  [facility];  Service: Endoscopy;  Laterality: N/A;  11:00am  . HAMMER TOE SURGERY     Bilateral hammer toe amputation  . INCISIONAL HERNIA REPAIR    . KNEE ARTHROSCOPY W/ MENISCAL REPAIR  2007   Bilateral  . POLYPECTOMY  09/18/2017   Procedure: POLYPECTOMY;  Surgeon: Daneil Dolin, MD;  Location: [facility]  END[facility];  Service: Endoscopy;;  colon  . UMBILICAL HERNIA REPAIR      Allergies  Allergen Reactions  . Codeine Anaphylaxis and Hives  . Morphine And Related Anaphylaxis and Hives  . Penicillins Anaphylaxis  . Ace Inhibitors Cough    Outpatient Encounter Medications as of 10/16/2017  Medication Sig  . acetaminophen (TYLENOL) 325 MG tablet Take 650 mg by mouth every 6 (six) hours as needed.   Marland Kitchen alendronate (FOSAMAX) 70 MG tablet Give 1 tablet by mouth on Sunday. Take with a full glass of water on an empty stomach.  Marland Kitchen apixaban (ELIQUIS) 5 MG TABS tablet Take 5 mg by mouth 2 (two) times daily.   . carboxymethylcellulose (REFRESH PLUS)  0.5 % SOLN Place 1 drop into both eyes 4 (four) times daily.  . cholecalciferol (VITAMIN D) 1000 UNITS tablet Take 1,000 Units by mouth daily.  . citalopram (CELEXA) 20 MG tablet Take 1 tablet (20 mg total) by mouth daily.  . digoxin (LANOXIN) 0.125 MG tablet Take 1 tablet by mouth once a day (HOLD FOR AP UNDER 60)  . furosemide (LASIX) 20 MG tablet Take 40 mg by mouth daily.   . insulin NPH-regular Human (NOVOLIN 70/30) (70-30) 100 UNIT/ML injection Inject 30 Units into the skin daily with supper.   . insulin NPH-regular Human (NOVOLIN 70/30) (70-30) 100 UNIT/ML injection Inject 55 Units into the skin daily with breakfast.   . Insulin Pen Needle (EASY TOUCH PEN NEEDLES) 31G X 8 MM MISC Use twice a day  . memantine (NAMENDA) 10 MG tablet Take 10 mg by mouth 2 (two) times daily.  . metolazone (ZAROXOLYN) 2.5 MG tablet Take 2.5 mg by mouth daily. On Mon., Wed Fri  . metoprolol tartrate (LOPRESSOR) 25 MG tablet Take 75 mg by mouth 2 (two) times daily.  . Multiple Vitamin (MULTIVITAMIN WITH MINERALS) TABS Take 1 tablet by mouth every morning.  . pantoprazole (PROTONIX) 40 MG tablet Take 40 mg by mouth daily.  . polyethylene glycol (MIRALAX / GLYCOLAX) packet Take 17 g by mouth daily.  . potassium chloride (MICRO-K) 10 MEQ CR capsule Take 20 mEq by mouth daily. Potassium chloride extended release capsule, give 20 meq by mouth once day  . pravastatin (PRAVACHOL) 20 MG tablet Take 20 mg by mouth daily. Take along with 40 mg to = 60 mg  . pravastatin (PRAVACHOL) 40 MG tablet Take 40 mg by mouth daily. Take along with 20 mg to = 60 mg  . sitaGLIPtin-metformin (JANUMET) 50-500 MG tablet Take 1 tablet by mouth 2 (two) times daily with a meal.  . [DISCONTINUED] doxycycline (VIBRAMYCIN) 100 MG capsule Take 100 mg by mouth 2 (two) times daily.  . [DISCONTINUED] K-DUR 20 MEQ tablet TAKE 2 TABLETS BY MOUTH ONCE DAILY.  . [DISCONTINUED] Lactobacillus Rhamnosus, GG, ([employer] PROBIOTIC, LACTOBACILLUS, PO) Take 1  capsule by mouth daily. Give 1 tablet by mouth once a day    No facility-administered encounter medications on file as of 10/16/2017.     Review of Systems   This is quite limited secondary to dementia-please see HPI- nursing does not report any acute issues today-patient states she does not have pain except at times when she walks    Immunization History  Administered Date(s) Administered  . Influenza-Unspecified 02/04/2016, 02/03/2017  . Pneumococcal Conjugate-13 02/03/2017  . Pneumococcal-Unspecified 02/08/2016  . Tdap 01/19/2017   Pertinent  Health Maintenance Due  Topic Date Due  . URINE MICROALBUMIN  11/15/2017 (Originally 05/11/2017)  . INFLUENZA VACCINE  11/30/2017  . FOOT EXAM  01/25/2018  . OPHTHALMOLOGY EXAM  02/22/2018  . HEMOGLOBIN A1C  04/13/2018  . DEXA SCAN  Completed  . PNA vac Low Risk Adult  Completed   Fall Risk  01/19/2017 11/06/2015 09/29/2015 08/31/2015 06/30/2015  Falls in the past year? No Yes No No No  Number falls in past yr: - 2 or more - - -  Injury with Fall? - No - - -   Functional Status Survey:    Vitals:   10/16/17 1551  BP: 102/64  Pulse: 76  Resp: 20  Temp: 98.1 F (36.7 C)  TempSrc: Oral  SpO2: 95%  Weight last noted was 209 pounds Manual blood pressure was 112/60 Physical Exam In general this is a pleasant well-developed and well-nourished elderly female   Skin is warm and dry.  Eyes visual acuity appears to be intact sclera and conjunctive are clear   oropharynx is clear mucous membranes moist  Chest is clear to auscultation there is no labored breathing.  Heart is irregular irregular rate and rhythm she has baseline lower extremity edema right greater than left.  Musculoskeletal moves all extremities x4 with appears baseline strength- she has arthritic changes to her knees bilaterally I do not note any increased warmth or erythema or tenderness to palpation.  Right leg has some mild residual venous stasis changes erythema I  do not see evidence of cellulitis with increased warmth or concerning erythema- per patient redness has essentially resolved  Neurologic is grossly intact her speech is clear no lateralizing findings   Psych she is oriented to self pleasant and appropriate  Labs reviewed: Recent Labs    08/08/17 1715 08/11/17 0730 10/12/17 0759  NA 137 139 140  K 4.0 3.9 3.8  CL 99* 102 97*  CO2 27 27 29   GLUCOSE 208* 163* 138*  BUN 26* 23* 44*  CREATININE 1.08* 0.94 1.22*  CALCIUM 9.0 8.8* 9.7   Recent Labs    11/14/16 0730 08/08/17 1459  AST 15 21  ALT 15 15  ALKPHOS 48 54  BILITOT 0.7 0.5  PROT 7.2 7.5  ALBUMIN 3.2* 3.3*   Recent Labs    08/08/17 1715 08/11/17 0730 08/21/17 0330 09/04/17 0612 10/12/17 0958  WBC 9.2 7.5 8.7 10.0 11.8*  NEUTROABS 6.3 4.9 4.8  --   --   HGB 10.3* 10.3* 10.8* 10.2* 12.7  HCT 33.6* 33.1* 36.1 33.3* 40.8  MCV 93.3 92.5 94.3 91.7 89.9  PLT 241 254 242 222 270   Lab Results  Component Value Date   TSH 2.822 08/08/2017   Lab Results  Component Value Date   HGBA1C 7.4 (H) 10/12/2017   Lab Results  Component Value Date   CHOL 156 05/16/2017   HDL 38 (L) 05/16/2017   LDLCALC 104 (H) 05/16/2017   TRIG 72 05/16/2017   CHOLHDL 4.1 05/16/2017    Significant Diagnostic Results in last 30 days:  No results found.  Assessment/Plan  #1 left knee discomfort-at this point will monitor- x-ray showed arthritis.  She does have Tylenol as needed for pain.  Pain management is somewhat complicated with codeine allergy- also would not be a good candidate for NSAIDs with history of GI bleed.  At this point she appears to be comfortable again this is more when she ambulates--she has been encouraged however to ask for assistance before attempting to ambulate since her gait is chronically unsteady and puts her at fall risk.  2.  Right lower leg cellulitis this  appears essentially resolved she does have some venous stasis edema erythema this point will  monitor   #3 leukocytosis mildly elevated white count of 11.8-there was no differential will order a CBC with differential when BMP is obtained on June 20 she does not show evidence of infection at this point.  Does not complain of fever chills dysuria or increased cough.  4-  history of weight gain again this appears to have moderated edema appears to be at baseline metolazone has been---added-update BMP is pending--await updated weight to confirm she has not gained weight   [id number]

## 2017-10-19 ENCOUNTER — Encounter (HOSPITAL_COMMUNITY)
Admission: RE | Admit: 2017-10-19 | Discharge: 2017-10-19 | Disposition: A | Discharge status: Home / Self Care | Payer: Medicare Other | Source: Skilled Nursing Facility | Attending: Internal Medicine | Admitting: Internal Medicine

## 2017-10-19 DIAGNOSIS — F329 Major depressive disorder, single episode, unspecified: Secondary | ICD-10-CM | POA: Insufficient documentation

## 2017-10-19 DIAGNOSIS — I5042 Chronic combined systolic (congestive) and diastolic (congestive) heart failure: Secondary | ICD-10-CM | POA: Diagnosis not present

## 2017-10-19 DIAGNOSIS — E1140 Type 2 diabetes mellitus with diabetic neuropathy, unspecified: Secondary | ICD-10-CM | POA: Diagnosis not present

## 2017-10-19 LAB — CBC WITH DIFFERENTIAL/PLATELET
BASOS ABS: 0 10*3/uL (ref 0.0–0.1)
BASOS PCT: 0 %
EOS ABS: 1 10*3/uL — AB (ref 0.0–0.7)
EOS PCT: 11 %
HCT: 38.3 % (ref 36.0–46.0)
Hemoglobin: 11.6 g/dL — ABNORMAL LOW (ref 12.0–15.0)
LYMPHS PCT: 24 %
Lymphs Abs: 2.2 10*3/uL (ref 0.7–4.0)
MCH: 27.5 pg (ref 26.0–34.0)
MCHC: 30.3 g/dL (ref 30.0–36.0)
MCV: 90.8 fL (ref 78.0–100.0)
Monocytes Absolute: 0.7 10*3/uL (ref 0.1–1.0)
Monocytes Relative: 7 %
Neutro Abs: 5.2 10*3/uL (ref 1.7–7.7)
Neutrophils Relative %: 58 %
PLATELETS: 224 10*3/uL (ref 150–400)
RBC: 4.22 MIL/uL (ref 3.87–5.11)
RDW: 14.4 % (ref 11.5–15.5)
WBC: 9.1 10*3/uL (ref 4.0–10.5)

## 2017-10-19 LAB — BASIC METABOLIC PANEL
ANION GAP: 10 (ref 5–15)
BUN: 23 mg/dL — ABNORMAL HIGH (ref 6–20)
CALCIUM: 9 mg/dL (ref 8.9–10.3)
CO2: 29 mmol/L (ref 22–32)
Chloride: 101 mmol/L (ref 101–111)
Creatinine, Ser: 1.08 mg/dL — ABNORMAL HIGH (ref 0.44–1.00)
GFR, EST AFRICAN AMERICAN: 56 mL/min — AB (ref >60)
GFR, EST NON AFRICAN AMERICAN: 48 mL/min — AB (ref >60)
GLUCOSE: 176 mg/dL — AB (ref 65–99)
Potassium: 3.7 mmol/L (ref 3.5–5.1)
SODIUM: 140 mmol/L (ref 135–145)

## 2017-10-25 DIAGNOSIS — M810 Age-related osteoporosis without current pathological fracture: Secondary | ICD-10-CM | POA: Diagnosis not present

## 2017-10-25 DIAGNOSIS — M6281 Muscle weakness (generalized): Secondary | ICD-10-CM | POA: Diagnosis not present

## 2017-10-25 DIAGNOSIS — F0390 Unspecified dementia without behavioral disturbance: Secondary | ICD-10-CM | POA: Diagnosis not present

## 2017-10-25 DIAGNOSIS — E1140 Type 2 diabetes mellitus with diabetic neuropathy, unspecified: Secondary | ICD-10-CM | POA: Diagnosis not present

## 2017-10-25 DIAGNOSIS — R262 Difficulty in walking, not elsewhere classified: Secondary | ICD-10-CM | POA: Diagnosis not present

## 2017-10-30 DIAGNOSIS — M810 Age-related osteoporosis without current pathological fracture: Secondary | ICD-10-CM | POA: Diagnosis not present

## 2017-10-30 DIAGNOSIS — Z89422 Acquired absence of other left toe(s): Secondary | ICD-10-CM | POA: Diagnosis not present

## 2017-10-30 DIAGNOSIS — E1140 Type 2 diabetes mellitus with diabetic neuropathy, unspecified: Secondary | ICD-10-CM | POA: Diagnosis not present

## 2017-10-30 DIAGNOSIS — Z794 Long term (current) use of insulin: Secondary | ICD-10-CM | POA: Diagnosis not present

## 2017-10-30 DIAGNOSIS — B351 Tinea unguium: Secondary | ICD-10-CM | POA: Diagnosis not present

## 2017-10-30 DIAGNOSIS — I739 Peripheral vascular disease, unspecified: Secondary | ICD-10-CM | POA: Diagnosis not present

## 2017-10-30 DIAGNOSIS — F0390 Unspecified dementia without behavioral disturbance: Secondary | ICD-10-CM | POA: Diagnosis not present

## 2017-10-30 DIAGNOSIS — M6281 Muscle weakness (generalized): Secondary | ICD-10-CM | POA: Diagnosis not present

## 2017-10-30 DIAGNOSIS — R262 Difficulty in walking, not elsewhere classified: Secondary | ICD-10-CM | POA: Diagnosis not present

## 2017-10-31 DIAGNOSIS — E1140 Type 2 diabetes mellitus with diabetic neuropathy, unspecified: Secondary | ICD-10-CM | POA: Diagnosis not present

## 2017-10-31 DIAGNOSIS — F0390 Unspecified dementia without behavioral disturbance: Secondary | ICD-10-CM | POA: Diagnosis not present

## 2017-10-31 DIAGNOSIS — M810 Age-related osteoporosis without current pathological fracture: Secondary | ICD-10-CM | POA: Diagnosis not present

## 2017-10-31 DIAGNOSIS — R262 Difficulty in walking, not elsewhere classified: Secondary | ICD-10-CM | POA: Diagnosis not present

## 2017-10-31 DIAGNOSIS — M6281 Muscle weakness (generalized): Secondary | ICD-10-CM | POA: Diagnosis not present

## 2017-11-03 DIAGNOSIS — M810 Age-related osteoporosis without current pathological fracture: Secondary | ICD-10-CM | POA: Diagnosis not present

## 2017-11-03 DIAGNOSIS — R262 Difficulty in walking, not elsewhere classified: Secondary | ICD-10-CM | POA: Diagnosis not present

## 2017-11-03 DIAGNOSIS — F0390 Unspecified dementia without behavioral disturbance: Secondary | ICD-10-CM | POA: Diagnosis not present

## 2017-11-03 DIAGNOSIS — E1140 Type 2 diabetes mellitus with diabetic neuropathy, unspecified: Secondary | ICD-10-CM | POA: Diagnosis not present

## 2017-11-03 DIAGNOSIS — M6281 Muscle weakness (generalized): Secondary | ICD-10-CM | POA: Diagnosis not present

## 2017-11-06 DIAGNOSIS — R262 Difficulty in walking, not elsewhere classified: Secondary | ICD-10-CM | POA: Diagnosis not present

## 2017-11-06 DIAGNOSIS — M810 Age-related osteoporosis without current pathological fracture: Secondary | ICD-10-CM | POA: Diagnosis not present

## 2017-11-06 DIAGNOSIS — F0390 Unspecified dementia without behavioral disturbance: Secondary | ICD-10-CM | POA: Diagnosis not present

## 2017-11-06 DIAGNOSIS — E1140 Type 2 diabetes mellitus with diabetic neuropathy, unspecified: Secondary | ICD-10-CM | POA: Diagnosis not present

## 2017-11-06 DIAGNOSIS — M6281 Muscle weakness (generalized): Secondary | ICD-10-CM | POA: Diagnosis not present

## 2017-11-07 DIAGNOSIS — M6281 Muscle weakness (generalized): Secondary | ICD-10-CM | POA: Diagnosis not present

## 2017-11-07 DIAGNOSIS — E1140 Type 2 diabetes mellitus with diabetic neuropathy, unspecified: Secondary | ICD-10-CM | POA: Diagnosis not present

## 2017-11-07 DIAGNOSIS — R262 Difficulty in walking, not elsewhere classified: Secondary | ICD-10-CM | POA: Diagnosis not present

## 2017-11-07 DIAGNOSIS — F0390 Unspecified dementia without behavioral disturbance: Secondary | ICD-10-CM | POA: Diagnosis not present

## 2017-11-07 DIAGNOSIS — M810 Age-related osteoporosis without current pathological fracture: Secondary | ICD-10-CM | POA: Diagnosis not present

## 2017-11-08 ENCOUNTER — Non-Acute Institutional Stay (SKILLED_NURSING_FACILITY): Payer: Medicare Other | Admitting: Internal Medicine

## 2017-11-08 ENCOUNTER — Other Ambulatory Visit (HOSPITAL_COMMUNITY)
Admission: RE | Admit: 2017-11-08 | Discharge: 2017-11-08 | Disposition: A | Discharge status: Home / Self Care | Payer: Medicare Other | Source: Skilled Nursing Facility | Attending: Internal Medicine | Admitting: Internal Medicine

## 2017-11-08 ENCOUNTER — Encounter: Payer: Self-pay | Admitting: Internal Medicine

## 2017-11-08 DIAGNOSIS — W19XXXA Unspecified fall, initial encounter: Secondary | ICD-10-CM | POA: Diagnosis not present

## 2017-11-08 DIAGNOSIS — I10 Essential (primary) hypertension: Secondary | ICD-10-CM

## 2017-11-08 DIAGNOSIS — M25511 Pain in right shoulder: Secondary | ICD-10-CM | POA: Diagnosis not present

## 2017-11-08 DIAGNOSIS — E1140 Type 2 diabetes mellitus with diabetic neuropathy, unspecified: Secondary | ICD-10-CM | POA: Diagnosis not present

## 2017-11-08 DIAGNOSIS — Z794 Long term (current) use of insulin: Secondary | ICD-10-CM | POA: Insufficient documentation

## 2017-11-08 DIAGNOSIS — R531 Weakness: Secondary | ICD-10-CM

## 2017-11-08 DIAGNOSIS — R262 Difficulty in walking, not elsewhere classified: Secondary | ICD-10-CM | POA: Diagnosis not present

## 2017-11-08 DIAGNOSIS — M810 Age-related osteoporosis without current pathological fracture: Secondary | ICD-10-CM | POA: Diagnosis not present

## 2017-11-08 DIAGNOSIS — K625 Hemorrhage of anus and rectum: Secondary | ICD-10-CM | POA: Diagnosis not present

## 2017-11-08 DIAGNOSIS — I5042 Chronic combined systolic (congestive) and diastolic (congestive) heart failure: Secondary | ICD-10-CM | POA: Diagnosis not present

## 2017-11-08 DIAGNOSIS — M6281 Muscle weakness (generalized): Secondary | ICD-10-CM | POA: Diagnosis not present

## 2017-11-08 DIAGNOSIS — R600 Localized edema: Secondary | ICD-10-CM | POA: Diagnosis not present

## 2017-11-08 DIAGNOSIS — I110 Hypertensive heart disease with heart failure: Secondary | ICD-10-CM | POA: Diagnosis not present

## 2017-11-08 DIAGNOSIS — F0390 Unspecified dementia without behavioral disturbance: Secondary | ICD-10-CM | POA: Diagnosis not present

## 2017-11-08 DIAGNOSIS — M25551 Pain in right hip: Secondary | ICD-10-CM | POA: Diagnosis not present

## 2017-11-08 DIAGNOSIS — J189 Pneumonia, unspecified organism: Secondary | ICD-10-CM | POA: Diagnosis not present

## 2017-11-08 LAB — COMPREHENSIVE METABOLIC PANEL
ALT: 19 U/L (ref 0–44)
AST: 25 U/L (ref 15–41)
Albumin: 3.3 g/dL — ABNORMAL LOW (ref 3.5–5.0)
Alkaline Phosphatase: 52 U/L (ref 38–126)
Anion gap: 11 (ref 5–15)
BILIRUBIN TOTAL: 0.5 mg/dL (ref 0.3–1.2)
BUN: 40 mg/dL — AB (ref 8–23)
CO2: 32 mmol/L (ref 22–32)
CREATININE: 1.43 mg/dL — AB (ref 0.44–1.00)
Calcium: 8.9 mg/dL (ref 8.9–10.3)
Chloride: 93 mmol/L — ABNORMAL LOW (ref 98–111)
GFR calc Af Amer: 40 mL/min — ABNORMAL LOW (ref >60)
GFR, EST NON AFRICAN AMERICAN: 34 mL/min — AB (ref >60)
Glucose, Bld: 155 mg/dL — ABNORMAL HIGH (ref 70–99)
Potassium: 3.1 mmol/L — ABNORMAL LOW (ref 3.5–5.1)
Sodium: 136 mmol/L (ref 135–145)
TOTAL PROTEIN: 7.9 g/dL (ref 6.5–8.1)

## 2017-11-08 LAB — CBC WITH DIFFERENTIAL/PLATELET
Basophils Absolute: 0 10*3/uL (ref 0.0–0.1)
Basophils Relative: 0 %
EOS PCT: 5 %
Eosinophils Absolute: 0.5 10*3/uL (ref 0.0–0.7)
HCT: 38.2 % (ref 36.0–46.0)
HEMOGLOBIN: 11.9 g/dL — AB (ref 12.0–15.0)
LYMPHS ABS: 2.6 10*3/uL (ref 0.7–4.0)
LYMPHS PCT: 27 %
MCH: 28.2 pg (ref 26.0–34.0)
MCHC: 31.2 g/dL (ref 30.0–36.0)
MCV: 90.5 fL (ref 78.0–100.0)
Monocytes Absolute: 1.1 10*3/uL — ABNORMAL HIGH (ref 0.1–1.0)
Monocytes Relative: 11 %
NEUTROS PCT: 57 %
Neutro Abs: 5.6 10*3/uL (ref 1.7–7.7)
Platelets: 248 10*3/uL (ref 150–400)
RBC: 4.22 MIL/uL (ref 3.87–5.11)
RDW: 15 % (ref 11.5–15.5)
WBC: 9.8 10*3/uL (ref 4.0–10.5)

## 2017-11-08 LAB — DIGOXIN LEVEL: DIGOXIN LVL: 1.2 ng/mL (ref 0.8–2.0)

## 2017-11-08 LAB — TSH: TSH: 3.269 u[IU]/mL (ref 0.350–4.500)

## 2017-11-08 NOTE — Progress Notes (Signed)
Location:   [place] Room Number: 113/W Place of Service:  SNF [phone]) Provider:  Dionicia Abler, MD  Patient Care Team: Celedonio Savage, MD as PCP - General (Family Medicine) Rothbart, Cristopher Estimable, MD (Cardiology) Lendon Colonel, NP as Nurse Practitioner (Nurse Practitioner)  Extended Emergency Contact Information Primary Emergency Contact: [facility] Address: [address]          [place], [place] [address] [name] of [place] Phone: [phone] Mobile Phone: [phone] Relation: Daughter  Code Status:  DNR Goals of care: Advanced Directive information Advanced Directives 11/08/2017  Does Patient Have a Medical Advance Directive? Yes  Type of Advance Directive Out of facility DNR (pink MOST or yellow form)  Does patient want to make changes to medical advance directive? No - Patient declined  Copy of [place] in Chart? No - copy requested  Pre-existing out of facility DNR order (yellow form or pink MOST form) -     Chief complaint--acute visit secondary to patient appeared to be somewhat weaker-not as energetic as normal  HPI:  Pt is a 77 y.o. female seen today for an acute visit for some increased weakness-is not having the energy she usually has  Nursing is noted apparently over the last couple days-they feel ever since she has had some sniffles a few days ago that she i has appeared to have less energy-they feel the sniffles and respiratory issues have largely resolved however  She is a poor historian secondary to dementia but is not complaining of any shortness of breath or increased cough- I did notice she did cough a couple times while I was assessing her today  She is a long-term resident of facility with a history of type 2 diabetes as well as atrial fibrillation- venous stasis edema-dementia-and recent GI bleed status post colonoscopy with polypectomy  Vital signs are stable and she does not really  complain of any pain shortness of breath cough or burning with urination  She did have a fall last night x-rays of the right shoulder and hip did not show any acute process and she is not complaining of pain today  Last hemoglobin in late June showed relative stability at 11.6-.  She is on Protonix started by GI.   She was recently treated for lower extremity cellulitis with doxycycline and appears to have resolved  She is also a type II diabetic on Novolin 70-30-twice daily is also on Janumet 50-500 twice daily.  She gets blood sugars twice a day and these actually appear to have been fairly stable- with morning sugars recently in the mid 100s to 200 and at 8 PM also in the mid 100s to low 200s although I do see an 8 PM reading of 95 as well as 76 within the last week.  According to nursing she is not eating as well          Past Medical History:  Diagnosis Date  . Allergy   . Atrial fibrillation ([place]) 08/2011   First diagnosed in 08/2011; duration of arrhythmia is uncertain  . Bilateral lower extremity edema   . Chronic diarrhea    diverticulosis  . COPD (chronic obstructive pulmonary disease) ([place])   . Dementia   . Diabetes mellitus, type 2 (HCC)    Diabetic neuropathy  . Dyspnea on exertion    pedal edema  . Gout   . Headache(784.0)    twice weekly  . Hyperlipidemia   . Hypertension   .  Osteopenia    DEXA scan 01/2010  . Palpitations   . Seasonal allergies   . Stress incontinence   . Vertigo    Past Surgical History:  Procedure Laterality Date  . APPENDECTOMY    . BREAST BIOPSY  2002   Right  . CESAREAN SECTION     x 2  . CHOLECYSTECTOMY    . COLONOSCOPY  2007   Negative screening study  . COLONOSCOPY WITH PROPOFOL N/A 09/18/2017   Procedure: COLONOSCOPY WITH PROPOFOL;  Surgeon: Daneil Dolin, MD;  [facility];  Service: Endoscopy;  Laterality: N/A;  11:00am  . HAMMER TOE SURGERY     Bilateral hammer toe amputation  . INCISIONAL HERNIA  REPAIR    . KNEE ARTHROSCOPY W/ MENISCAL REPAIR  2007   Bilateral  . POLYPECTOMY  09/18/2017   Procedure: POLYPECTOMY;  Surgeon: Daneil Dolin, MD;  [facility];  Service: Endoscopy;;  colon  . UMBILICAL HERNIA REPAIR      Allergies  Allergen Reactions  . Codeine Anaphylaxis and Hives  . Morphine And Related Anaphylaxis and Hives  . Penicillins Anaphylaxis  . Ace Inhibitors Cough    Outpatient Encounter Medications as of 11/08/2017  Medication Sig  . acetaminophen (TYLENOL) 325 MG tablet Take 650 mg by mouth every 6 (six) hours as needed.   Marland Kitchen alendronate (FOSAMAX) 70 MG tablet Give 1 tablet by mouth on Sunday. Take with a full glass of water on an empty stomach.  Marland Kitchen apixaban (ELIQUIS) 5 MG TABS tablet Take 5 mg by mouth 2 (two) times daily.   . carboxymethylcellulose (REFRESH PLUS) 0.5 % SOLN Place 1 drop into both eyes 4 (four) times daily.  . cholecalciferol (VITAMIN D) 1000 UNITS tablet Take 1,000 Units by mouth daily.  . citalopram (CELEXA) 20 MG tablet Take 1 tablet (20 mg total) by mouth daily.  . digoxin (LANOXIN) 0.125 MG tablet Take 1 tablet by mouth once a day (HOLD FOR AP UNDER 60)  . furosemide (LASIX) 20 MG tablet Take 40 mg by mouth daily.   . insulin NPH-regular Human (NOVOLIN 70/30) (70-30) 100 UNIT/ML injection Inject 30 Units into the skin daily with supper.   . insulin NPH-regular Human (NOVOLIN 70/30) (70-30) 100 UNIT/ML injection Inject 55 Units into the skin daily with breakfast.   . Insulin Pen Needle (EASY TOUCH PEN NEEDLES) 31G X 8 MM MISC Use twice a day  . memantine (NAMENDA) 10 MG tablet Take 10 mg by mouth 2 (two) times daily.  . metolazone (ZAROXOLYN) 2.5 MG tablet Take 2.5 mg by mouth daily. On Mon., Wed Fri  . metoprolol tartrate (LOPRESSOR) 25 MG tablet Take 75 mg by mouth 2 (two) times daily.  . Multiple Vitamin (MULTIVITAMIN WITH MINERALS) TABS Take 1 tablet by mouth every morning.  . pantoprazole (PROTONIX) 40 MG tablet Take 40 mg by  mouth daily.  . polyethylene glycol (MIRALAX / GLYCOLAX) packet Take 17 g by mouth daily.  . potassium chloride (MICRO-K) 10 MEQ CR capsule Take 20 mEq by mouth daily. Potassium chloride extended release capsule, give 20 meq by mouth once day  . pravastatin (PRAVACHOL) 20 MG tablet Take 20 mg by mouth daily. Take along with 40 mg to = 60 mg  . pravastatin (PRAVACHOL) 40 MG tablet Take 40 mg by mouth daily. Take along with 20 mg to = 60 mg  . sitaGLIPtin-metformin (JANUMET) 50-500 MG tablet Take 1 tablet by mouth 2 (two) times daily with a meal.  . [  DISCONTINUED] K-DUR 20 MEQ tablet TAKE 2 TABLETS BY MOUTH ONCE DAILY.   No facility-administered encounter medications on file as of 11/08/2017.     Review of Systems   This is limited secondary to dementia please see HPI and she has no acute complaints but nursing feels she is just has gradually less energy not eating as well  Did not complain arm leg or hip discomfort  Immunization History  Administered Date(s) Administered  . Influenza-Unspecified 02/04/2016, 02/03/2017  . Pneumococcal Conjugate-13 02/03/2017  . Pneumococcal-Unspecified 02/08/2016  . Tdap 01/19/2017   Pertinent  Health Maintenance Due  Topic Date Due  . URINE MICROALBUMIN  11/15/2017 (Originally 05/11/2017)  . INFLUENZA VACCINE  11/30/2017  . FOOT EXAM  01/25/2018  . OPHTHALMOLOGY EXAM  02/22/2018  . HEMOGLOBIN A1C  04/13/2018  . DEXA SCAN  Completed  . PNA vac Low Risk Adult  Completed   Fall Risk  01/19/2017 11/06/2015 09/29/2015 08/31/2015 06/30/2015  Falls in the past year? No Yes No No No  Number falls in past yr: - 2 or more - - -  Injury with Fall? - No - - -   Functional Status Survey:    Vitals:   11/08/17 1421  BP: 128/83  Pulse: 73  Resp: 18  Temp: (!) 97.5 F (36.4 C)  TempSrc: Oral  SpO2: 93%    Physical Exam   In general this is a pleasant well-developed elderly female resting comfortably in bed she is sleeping but easily arousable.  Her  skin is warm and dry.    Eyes sclera and conjunctive are clear visual acuity appears to be intact pupils appear to be reactive to light  Oropharynx is clear--mucous  membranes moist tongue is midline with full range of motion   Chest is clear to auscultation there is no labored breathing  Heart is regular irregular rate and rhythm without murmur gallop or rub she has baseline lower extremity edema venous stasis changes   Abdomen is obese soft does not appear to be tender there are positive bowel sounds  GU could not really appreciate suprapubic tenderness  Musculoskeletal appears to be at baseline grip strength appears to be adequate bilaterally-  Appears to move her arms and legs without pain she sits up in bed without any complaints of pain  Has baseline lower extremity weakness but is able to move all extremities it appears at baseline  In fact when I removed her compression hose she sat up in bed quickly and was somewhat agitated which according to nursing is typical behavior when her legs are palpated to any extent  Neurologic appears to be grossly intact could not really appreciate any changes cranial nerves appear to be intact  Psych she is oriented to self does appear to be somewhat more lethargic but with invasive maneuvers especially involving her legs she quickly returns to baseline-   Labs reviewed: Recent Labs    08/11/17 0730 10/12/17 0759 10/19/17 0729  NA 139 140 140  K 3.9 3.8 3.7  CL 102 97* 101  CO2 27 29 29   GLUCOSE 163* 138* 176*  BUN 23* 44* 23*  CREATININE 0.94 1.22* 1.08*  CALCIUM 8.8* 9.7 9.0   Recent Labs    11/14/16 0730 08/08/17 1459  AST 15 21  ALT 15 15  ALKPHOS 48 54  BILITOT 0.7 0.5  PROT 7.2 7.5  ALBUMIN 3.2* 3.3*   Recent Labs    08/11/17 0730 08/21/17 0330 09/04/17 0612 10/12/17 0093 10/19/17 8182  WBC 7.5 8.7 10.0 11.8* 9.1  NEUTROABS 4.9 4.8  --   --  5.2  HGB 10.3* 10.8* 10.2* 12.7 11.6*  HCT 33.1* 36.1 33.3*  40.8 38.3  MCV 92.5 94.3 91.7 89.9 90.8  PLT 254 242 222 270 224   Lab Results  Component Value Date   TSH 2.822 08/08/2017   Lab Results  Component Value Date   HGBA1C 7.4 (H) 10/12/2017   Lab Results  Component Value Date   CHOL 156 05/16/2017   HDL 38 (L) 05/16/2017   LDLCALC 104 (H) 05/16/2017   TRIG 72 05/16/2017   CHOLHDL 4.1 05/16/2017    Significant Diagnostic Results in last 30 days:  No results found.  Assessment/Plan  #1-increased weakness question lethargy-- he does not appear to be in any distress and at times appears to have the energy of her baseline especially when she is agitated- but per nursing they have noticed some lack of energy recently especially since she had respiratory issues which were treated with PRN Robitussin and apparently resolved-  Will order lab work including CBC with differential comprehensive metabolic panel TSH and digoxin level- continue to monitor vital signs pulse ox every shift.  Also will obtain a urinalysis and culture as well as a chest x-ray.  2 history of type 2 diabetes she is on Novolin 70/30 55 units in the morning and 30 units p.m.----- blood sugars appear to be fairly stable but since she is not eating as well-we will cut back on her insulin somewhat down to 40 units in the morning and 20 units p.m.-would like to avoid any hypoglycemia- monitor blood sugars for now [facility] and at bedtime notify provider if CBG less than 60 or greater than 300--  She also continues on Janumet 50-500 twice a day  #3- history of GI bleed- she has had a colonoscopy with polyp removal-has been started on Protonix- hemoglobin has been relatively stable but will recheck this to make sure this is not contributing to her weakness.    #4 history of atrial fibrillation continues on Eliquis 5 mg twice a day as well as digoxin she is on Lopressor for rate control.  5.  History of hypertension continues on Lopressor twice a day-  losartan previously has  been discontinued because of low blood pressures- Recent blood pressures appear to be pretty stable  #6-  lower extremity edema- she has been started on metolazone  three times a week per nursing edema appears to be stabilized-again will update blood work----  Addendum   We have received most of the updated lab work TSH is pending however CBC is reassuring with a stable hemoglobin of 11.9-white count is 9.8 platelets 485  Metabolic panel does show a low potassium of 3.1 and BUN somewhat elevated at 40 creatinine is also gone up somewhat at 1.43  Digoxin level is within normal range at 1.2  Will await studies of the chest x-ray and urinalysis and culture and continue to monitor  Clinically she is not in any distress again quite talkative this evening speaking with her daughter and I    [id number]-of note greater than 40 minutes spent assessing patient-discussing her status with nursing staff as well as with her daughter at bedside- reviewing her chart and labs including updated labs- coordinating and formulating a plan of care of note greater than 50% of time spent coordinating plan of care with input as noted above  Later this evening patient was visiting with her daughter and appeared  to be fairly bright and alert although her daughter does state that she appears to be more confused the last couple days.

## 2017-11-09 ENCOUNTER — Non-Acute Institutional Stay (SKILLED_NURSING_FACILITY): Payer: Medicare Other | Admitting: Internal Medicine

## 2017-11-09 ENCOUNTER — Encounter: Payer: Self-pay | Admitting: Internal Medicine

## 2017-11-09 DIAGNOSIS — E1140 Type 2 diabetes mellitus with diabetic neuropathy, unspecified: Secondary | ICD-10-CM | POA: Diagnosis not present

## 2017-11-09 DIAGNOSIS — Z794 Long term (current) use of insulin: Secondary | ICD-10-CM

## 2017-11-09 DIAGNOSIS — F0390 Unspecified dementia without behavioral disturbance: Secondary | ICD-10-CM | POA: Diagnosis not present

## 2017-11-09 DIAGNOSIS — M810 Age-related osteoporosis without current pathological fracture: Secondary | ICD-10-CM | POA: Diagnosis not present

## 2017-11-09 DIAGNOSIS — E876 Hypokalemia: Secondary | ICD-10-CM

## 2017-11-09 DIAGNOSIS — R262 Difficulty in walking, not elsewhere classified: Secondary | ICD-10-CM | POA: Diagnosis not present

## 2017-11-09 DIAGNOSIS — R609 Edema, unspecified: Secondary | ICD-10-CM

## 2017-11-09 DIAGNOSIS — J189 Pneumonia, unspecified organism: Secondary | ICD-10-CM

## 2017-11-09 DIAGNOSIS — M6281 Muscle weakness (generalized): Secondary | ICD-10-CM | POA: Diagnosis not present

## 2017-11-09 LAB — URINALYSIS, ROUTINE W REFLEX MICROSCOPIC
BACTERIA UA: NONE SEEN
BILIRUBIN URINE: NEGATIVE
Glucose, UA: NEGATIVE mg/dL
KETONES UR: NEGATIVE mg/dL
Nitrite: NEGATIVE
Protein, ur: NEGATIVE mg/dL
Specific Gravity, Urine: 1.013 (ref 1.005–1.030)
pH: 5 (ref 5.0–8.0)

## 2017-11-09 NOTE — Progress Notes (Signed)
Location:   [facility] Room Number: 113/W Place of Service:  SNF [id number]) Provider:  Dionicia Abler, MD  Patient Care Team: Celedonio Savage, MD as PCP - General (Family Medicine) Rothbart, Cristopher Estimable, MD (Cardiology) Lendon Colonel, NP as Nurse Practitioner (Nurse Practitioner)  Extended Emergency Contact Information Primary Emergency Contact: [facility] Address: [address]          [place], [place] [address] [name] of [place] Phone: [phone] Mobile Phone: [phone] Relation: Daughter  Code Status:  DNR Goals of care: Advanced Directive information Advanced Directives 11/09/2017  Does Patient Have a Medical Advance Directive? Yes  Type of Advance Directive Out of facility DNR (pink MOST or yellow form)  Does patient want to make changes to medical advance directive? No - Patient declined  Copy of [place] in Chart? No - copy requested  Pre-existing out of facility DNR order (yellow form or pink MOST form) -    Chief complaint- acute visit follow-up weakness-also x-ray of chest showing possible pneumonia   HPI:  Pt is a 77 y.o. female seen today for an acute visit for follow-up of visit yesterday when patient was seen for some increased weakness --- Lethargy  Lab work came back showing a stable hemoglobin and normal white count- potassium was low and this is being supplemented with updated labs ordered for tomorrow   Creatinine also was up somewhat at 1.43 and we did hold her Lasix this morning- she has also been started on metolazone 3 times a week- this was added because of increased lower extremity edema which appears to have stabilized   We also obtained a chest x-ray which is come back showing a mild patchy left basilar density compatible with pneumonia or atelectasis  She does continue to have a cough today and will start on an antibiotic.  She also has an order for [email or web address]  We have also  obtained a urinalysis and culture so far shows large leukocytes negative nitrites no bacteria WBCs greater than 50 will await culture She appears to be more herself today she is up in the wheelchair quite talkative participating with therapy today  She does not really complain of increased shortness of breath but she does have a nonproductive cough which appears fairly persistent   We also decreased her Novolin 70/30 insulin which she receives twice daily because of concerns of poor p.o. intake- blood sugar this morning was 173 last night was 179-at this point will monitor.        Past Medical History:  Diagnosis Date  . Allergy   . Atrial fibrillation ([place]) 08/2011   First diagnosed in 08/2011; duration of arrhythmia is uncertain  . Bilateral lower extremity edema   . Chronic diarrhea    diverticulosis  . COPD (chronic obstructive pulmonary disease) ([place])   . Dementia   . Diabetes mellitus, type 2 (HCC)    Diabetic neuropathy  . Dyspnea on exertion    pedal edema  . Gout   . Headache(784.0)    twice weekly  . Hyperlipidemia   . Hypertension   . Osteopenia    DEXA scan 01/2010  . Palpitations   . Seasonal allergies   . Stress incontinence   . Vertigo    Past Surgical History:  Procedure Laterality Date  . APPENDECTOMY    . BREAST BIOPSY  2002   Right  . CESAREAN SECTION     x 2  . CHOLECYSTECTOMY    .  COLONOSCOPY  2007   Negative screening study  . COLONOSCOPY WITH PROPOFOL N/A 09/18/2017   Procedure: COLONOSCOPY WITH PROPOFOL;  Surgeon: Daneil Dolin, MD;  [facility];  Service: Endoscopy;  Laterality: N/A;  11:00am  . HAMMER TOE SURGERY     Bilateral hammer toe amputation  . INCISIONAL HERNIA REPAIR    . KNEE ARTHROSCOPY W/ MENISCAL REPAIR  2007   Bilateral  . POLYPECTOMY  09/18/2017   Procedure: POLYPECTOMY;  Surgeon: Daneil Dolin, MD;  [facility];  Service: Endoscopy;;  colon  . UMBILICAL HERNIA REPAIR      Allergies    Allergen Reactions  . Codeine Anaphylaxis and Hives  . Morphine And Related Anaphylaxis and Hives  . Penicillins Anaphylaxis  . Ace Inhibitors Cough    Outpatient Encounter Medications as of 11/09/2017  Medication Sig  . acetaminophen (TYLENOL) 325 MG tablet Take 650 mg by mouth every 6 (six) hours as needed.   Marland Kitchen alendronate (FOSAMAX) 70 MG tablet Give 1 tablet by mouth on Sunday. Take with a full glass of water on an empty stomach.  Marland Kitchen apixaban (ELIQUIS) 5 MG TABS tablet Take 5 mg by mouth 2 (two) times daily.   . carboxymethylcellulose (REFRESH PLUS) 0.5 % SOLN Place 1 drop into both eyes 4 (four) times daily.  . cholecalciferol (VITAMIN D) 1000 UNITS tablet Take 1,000 Units by mouth daily.  . citalopram (CELEXA) 20 MG tablet Take 1 tablet (20 mg total) by mouth daily.  . digoxin (LANOXIN) 0.125 MG tablet Take 1 tablet by mouth once a day (HOLD FOR AP UNDER 60)  . furosemide (LASIX) 20 MG tablet Take 40 mg by mouth daily.   . insulin NPH-regular Human (NOVOLIN 70/30) (70-30) 100 UNIT/ML injection Inject 20 Units into the skin daily with supper.   . insulin NPH-regular Human (NOVOLIN 70/30) (70-30) 100 UNIT/ML injection Inject 40 Units into the skin daily with breakfast.   . Insulin Pen Needle (EASY TOUCH PEN NEEDLES) 31G X 8 MM MISC Use twice a day  . memantine (NAMENDA) 10 MG tablet Take 10 mg by mouth 2 (two) times daily.  . metolazone (ZAROXOLYN) 2.5 MG tablet Take 2.5 mg by mouth daily. On Mon., Wed Fri  . metoprolol tartrate (LOPRESSOR) 25 MG tablet Take 75 mg by mouth 2 (two) times daily.  . Multiple Vitamin (MULTIVITAMIN WITH MINERALS) TABS Take 1 tablet by mouth every morning.  . pantoprazole (PROTONIX) 40 MG tablet Take 40 mg by mouth daily.  . polyethylene glycol (MIRALAX / GLYCOLAX) packet Take 17 g by mouth daily.  . potassium chloride (MICRO-K) 10 MEQ CR capsule Take 20 mEq by mouth daily. Potassium chloride extended release capsule, give 20 meq by mouth three times a day  .  pravastatin (PRAVACHOL) 20 MG tablet Take 20 mg by mouth daily. Take along with 40 mg to = 60 mg  . pravastatin (PRAVACHOL) 40 MG tablet Take 40 mg by mouth daily. Take along with 20 mg to = 60 mg  . sitaGLIPtin-metformin (JANUMET) 50-500 MG tablet Take 1 tablet by mouth 2 (two) times daily with a meal.  . [DISCONTINUED] K-DUR 20 MEQ tablet TAKE 2 TABLETS BY MOUTH ONCE DAILY.   No facility-administered encounter medications on file as of 11/09/2017.     Review of Systems   This is limited secondary to dementia please see HPI- she is not really complaining of any shortness of breath dysuria-however she does have a cough- she is more energetic  today  Immunization History  Administered Date(s) Administered  . Influenza-Unspecified 02/04/2016, 02/03/2017  . Pneumococcal Conjugate-13 02/03/2017  . Pneumococcal-Unspecified 02/08/2016  . Tdap 01/19/2017   Pertinent  Health Maintenance Due  Topic Date Due  . URINE MICROALBUMIN  11/15/2017 (Originally 05/11/2017)  . INFLUENZA VACCINE  11/30/2017  . FOOT EXAM  01/25/2018  . OPHTHALMOLOGY EXAM  02/22/2018  . HEMOGLOBIN A1C  04/13/2018  . DEXA SCAN  Completed  . PNA vac Low Risk Adult  Completed   Fall Risk  01/19/2017 11/06/2015 09/29/2015 08/31/2015 06/30/2015  Falls in the past year? No Yes No No No  Number falls in past yr: - 2 or more - - -  Injury with Fall? - No - - -   Functional Status Survey:    Vitals:   11/09/17 1244  BP: 101/61  Pulse: (!) 55  Resp: 16  Temp: 97.6 F (36.4 C)  TempSrc: Oral  SpO2: 95%  Manual blood pressure was118/70-pulse was 68 Physical Exam   In general this is a pleasant alert talkative elderly female in no distress sitting comfortably in her wheelchair she appears more energetic than yesterday   skin is warm and dry.  Eyes sclera conjunctive are clear visual acuity appears to be intact  Oropharynx is clear mucous membranes moist.  Chest she does have some scattered rhonchi there is no labored  breathing she does cough with deep inspiration no sign of distress.  Heart is irregular regular rate and rhythm without murmur gallop or rub her edema appears to be baseline.--She has compression hose on  GU could not really appreciate suprapubic tenderness.  Abdomen is obese soft nontender with positive bowel sounds   Musculo skeletal moves all her extremities at baseline  Neurologic appears to be grossly intact her speech is clear no lateralizing findings she is bright and alert has cognitive deficits but no changes from baseline  Psych as noted above she is oriented to self will follow simple verbal commands-pleasant and cooperative   Labs reviewed: Recent Labs    10/12/17 0759 10/19/17 0729 11/08/17 1625  NA 140 140 136  K 3.8 3.7 3.1*  CL 97* 101 93*  CO2 29 29 32  GLUCOSE 138* 176* 155*  BUN 44* 23* 40*  CREATININE 1.22* 1.08* 1.43*  CALCIUM 9.7 9.0 8.9   Recent Labs    11/14/16 0730 08/08/17 1459 11/08/17 1625  AST 15 21 25   ALT 15 15 19   ALKPHOS 48 54 52  BILITOT 0.7 0.5 0.5  PROT 7.2 7.5 7.9  ALBUMIN 3.2* 3.3* 3.3*   Recent Labs    08/21/17 0330  10/12/17 0958 10/19/17 0729 11/08/17 1625  WBC 8.7   < > 11.8* 9.1 9.8  NEUTROABS 4.8  --   --  5.2 5.6  HGB 10.8*   < > 12.7 11.6* 11.9*  HCT 36.1   < > 40.8 38.3 38.2  MCV 94.3   < > 89.9 90.8 90.5  PLT 242   < > 270 224 248   < > = values in this interval not displayed.   Lab Results  Component Value Date   TSH 3.269 11/08/2017   Lab Results  Component Value Date   HGBA1C 7.4 (H) 10/12/2017   Lab Results  Component Value Date   CHOL 156 05/16/2017   HDL 38 (L) 05/16/2017   LDLCALC 104 (H) 05/16/2017   TRIG 72 05/16/2017   CHOLHDL 4.1 05/16/2017    Significant Diagnostic Results in last 30 days:  No results found.  Assessment/Plan  1.-History of possible pneumonia-she does have a fairly persistent cough will start her on antibiotic Levaquin 500 mg x 1 dose then 250 mg for 5 additional  days  Also will start a probiotic twice daily for 7 days.  Will add Mucinex 600 mg twice daily for 7 days.  Monitor vital signs pulse ox every shift for 72 hours.  2--Diabetes type 2-as noted above her Novolin 70-30-has been reduced down to 40 mg in the morning-and 20 mg at night-at this point will monitor I suspect her p.o. appetite will gradually increase and thus will make adjustments but would like to avoid chances of significant hypoglycemia --- she also is on Janumet 50-500 twice a day  #3--- low extremity  edema-again her Lasix was held this morning will resume this starting tomorrow-at this point will hold metolazone until further notice update BMP is pending for tomoorow as well as next week.  4-- hypokalemia again potassium is being more aggressively supplemented with 20 mEq 3 times a day today-then reduce to 20 mEq twice daily--update BMP pending for tomorrow and next week.    [id number]

## 2017-11-10 ENCOUNTER — Encounter (HOSPITAL_COMMUNITY)
Admission: RE | Admit: 2017-11-10 | Discharge: 2017-11-10 | Disposition: A | Discharge status: Home / Self Care | Payer: Medicare Other | Source: Skilled Nursing Facility | Attending: Internal Medicine | Admitting: Internal Medicine

## 2017-11-10 DIAGNOSIS — I5042 Chronic combined systolic (congestive) and diastolic (congestive) heart failure: Secondary | ICD-10-CM | POA: Diagnosis not present

## 2017-11-10 DIAGNOSIS — F0390 Unspecified dementia without behavioral disturbance: Secondary | ICD-10-CM | POA: Diagnosis not present

## 2017-11-10 DIAGNOSIS — E1140 Type 2 diabetes mellitus with diabetic neuropathy, unspecified: Secondary | ICD-10-CM | POA: Diagnosis not present

## 2017-11-10 DIAGNOSIS — M6281 Muscle weakness (generalized): Secondary | ICD-10-CM | POA: Diagnosis not present

## 2017-11-10 DIAGNOSIS — R262 Difficulty in walking, not elsewhere classified: Secondary | ICD-10-CM | POA: Diagnosis not present

## 2017-11-10 DIAGNOSIS — M810 Age-related osteoporosis without current pathological fracture: Secondary | ICD-10-CM | POA: Diagnosis not present

## 2017-11-10 LAB — BASIC METABOLIC PANEL
Anion gap: 10 (ref 5–15)
BUN: 33 mg/dL — ABNORMAL HIGH (ref 8–23)
CO2: 30 mmol/L (ref 22–32)
CREATININE: 1.24 mg/dL — AB (ref 0.44–1.00)
Calcium: 8.9 mg/dL (ref 8.9–10.3)
Chloride: 98 mmol/L (ref 98–111)
GFR calc Af Amer: 47 mL/min — ABNORMAL LOW (ref >60)
GFR, EST NON AFRICAN AMERICAN: 41 mL/min — AB (ref >60)
Glucose, Bld: 194 mg/dL — ABNORMAL HIGH (ref 70–99)
Potassium: 4 mmol/L (ref 3.5–5.1)
SODIUM: 138 mmol/L (ref 135–145)

## 2017-11-11 LAB — URINE CULTURE: Culture: >=100000 — AB

## 2017-11-13 ENCOUNTER — Encounter (HOSPITAL_COMMUNITY)
Admission: RE | Admit: 2017-11-13 | Discharge: 2017-11-13 | Disposition: A | Discharge status: Home / Self Care | Payer: Medicare Other | Source: Skilled Nursing Facility | Attending: *Deleted | Admitting: *Deleted

## 2017-11-13 DIAGNOSIS — M6281 Muscle weakness (generalized): Secondary | ICD-10-CM | POA: Diagnosis not present

## 2017-11-13 DIAGNOSIS — E1140 Type 2 diabetes mellitus with diabetic neuropathy, unspecified: Secondary | ICD-10-CM | POA: Diagnosis not present

## 2017-11-13 DIAGNOSIS — I5042 Chronic combined systolic (congestive) and diastolic (congestive) heart failure: Secondary | ICD-10-CM | POA: Diagnosis not present

## 2017-11-13 DIAGNOSIS — M810 Age-related osteoporosis without current pathological fracture: Secondary | ICD-10-CM | POA: Diagnosis not present

## 2017-11-13 DIAGNOSIS — R262 Difficulty in walking, not elsewhere classified: Secondary | ICD-10-CM | POA: Diagnosis not present

## 2017-11-13 DIAGNOSIS — F0390 Unspecified dementia without behavioral disturbance: Secondary | ICD-10-CM | POA: Diagnosis not present

## 2017-11-13 LAB — BASIC METABOLIC PANEL
Anion gap: 9 (ref 5–15)
BUN: 31 mg/dL — ABNORMAL HIGH (ref 8–23)
CHLORIDE: 100 mmol/L (ref 98–111)
CO2: 31 mmol/L (ref 22–32)
Calcium: 9 mg/dL (ref 8.9–10.3)
Creatinine, Ser: 1.33 mg/dL — ABNORMAL HIGH (ref 0.44–1.00)
GFR calc Af Amer: 43 mL/min — ABNORMAL LOW (ref >60)
GFR, EST NON AFRICAN AMERICAN: 37 mL/min — AB (ref >60)
Glucose, Bld: 186 mg/dL — ABNORMAL HIGH (ref 70–99)
POTASSIUM: 3.7 mmol/L (ref 3.5–5.1)
Sodium: 140 mmol/L (ref 135–145)

## 2017-11-14 DIAGNOSIS — E1140 Type 2 diabetes mellitus with diabetic neuropathy, unspecified: Secondary | ICD-10-CM | POA: Diagnosis not present

## 2017-11-14 DIAGNOSIS — M6281 Muscle weakness (generalized): Secondary | ICD-10-CM | POA: Diagnosis not present

## 2017-11-14 DIAGNOSIS — F0390 Unspecified dementia without behavioral disturbance: Secondary | ICD-10-CM | POA: Diagnosis not present

## 2017-11-14 DIAGNOSIS — M810 Age-related osteoporosis without current pathological fracture: Secondary | ICD-10-CM | POA: Diagnosis not present

## 2017-11-14 DIAGNOSIS — R262 Difficulty in walking, not elsewhere classified: Secondary | ICD-10-CM | POA: Diagnosis not present

## 2017-11-15 ENCOUNTER — Encounter (HOSPITAL_COMMUNITY)
Admission: RE | Admit: 2017-11-15 | Discharge: 2017-11-15 | Disposition: A | Discharge status: Home / Self Care | Payer: Medicare Other | Source: Skilled Nursing Facility | Attending: Internal Medicine | Admitting: Internal Medicine

## 2017-11-15 DIAGNOSIS — E1140 Type 2 diabetes mellitus with diabetic neuropathy, unspecified: Secondary | ICD-10-CM | POA: Diagnosis not present

## 2017-11-15 DIAGNOSIS — I5042 Chronic combined systolic (congestive) and diastolic (congestive) heart failure: Secondary | ICD-10-CM | POA: Diagnosis not present

## 2017-11-15 DIAGNOSIS — F329 Major depressive disorder, single episode, unspecified: Secondary | ICD-10-CM | POA: Insufficient documentation

## 2017-11-15 DIAGNOSIS — F0390 Unspecified dementia without behavioral disturbance: Secondary | ICD-10-CM | POA: Diagnosis not present

## 2017-11-15 DIAGNOSIS — R262 Difficulty in walking, not elsewhere classified: Secondary | ICD-10-CM | POA: Diagnosis not present

## 2017-11-15 DIAGNOSIS — M810 Age-related osteoporosis without current pathological fracture: Secondary | ICD-10-CM | POA: Diagnosis not present

## 2017-11-15 DIAGNOSIS — M6281 Muscle weakness (generalized): Secondary | ICD-10-CM | POA: Diagnosis not present

## 2017-11-15 LAB — BASIC METABOLIC PANEL
ANION GAP: 10 (ref 5–15)
BUN: 31 mg/dL — ABNORMAL HIGH (ref 8–23)
CALCIUM: 8.5 mg/dL — AB (ref 8.9–10.3)
CO2: 28 mmol/L (ref 22–32)
Chloride: 100 mmol/L (ref 98–111)
Creatinine, Ser: 1.27 mg/dL — ABNORMAL HIGH (ref 0.44–1.00)
GFR calc non Af Amer: 40 mL/min — ABNORMAL LOW (ref >60)
GFR, EST AFRICAN AMERICAN: 46 mL/min — AB (ref >60)
Glucose, Bld: 230 mg/dL — ABNORMAL HIGH (ref 70–99)
Potassium: 3.3 mmol/L — ABNORMAL LOW (ref 3.5–5.1)
Sodium: 138 mmol/L (ref 135–145)

## 2017-11-16 DIAGNOSIS — M810 Age-related osteoporosis without current pathological fracture: Secondary | ICD-10-CM | POA: Diagnosis not present

## 2017-11-16 DIAGNOSIS — M6281 Muscle weakness (generalized): Secondary | ICD-10-CM | POA: Diagnosis not present

## 2017-11-16 DIAGNOSIS — E1140 Type 2 diabetes mellitus with diabetic neuropathy, unspecified: Secondary | ICD-10-CM | POA: Diagnosis not present

## 2017-11-16 DIAGNOSIS — R262 Difficulty in walking, not elsewhere classified: Secondary | ICD-10-CM | POA: Diagnosis not present

## 2017-11-16 DIAGNOSIS — F0390 Unspecified dementia without behavioral disturbance: Secondary | ICD-10-CM | POA: Diagnosis not present

## 2017-11-17 ENCOUNTER — Encounter (HOSPITAL_COMMUNITY)
Admission: RE | Admit: 2017-11-17 | Discharge: 2017-11-17 | Disposition: A | Discharge status: Home / Self Care | Payer: Medicare Other | Source: Skilled Nursing Facility | Attending: Internal Medicine | Admitting: Internal Medicine

## 2017-11-17 DIAGNOSIS — R262 Difficulty in walking, not elsewhere classified: Secondary | ICD-10-CM | POA: Diagnosis not present

## 2017-11-17 DIAGNOSIS — E1140 Type 2 diabetes mellitus with diabetic neuropathy, unspecified: Secondary | ICD-10-CM | POA: Insufficient documentation

## 2017-11-17 DIAGNOSIS — I5042 Chronic combined systolic (congestive) and diastolic (congestive) heart failure: Secondary | ICD-10-CM | POA: Insufficient documentation

## 2017-11-17 DIAGNOSIS — M810 Age-related osteoporosis without current pathological fracture: Secondary | ICD-10-CM | POA: Diagnosis not present

## 2017-11-17 DIAGNOSIS — M6281 Muscle weakness (generalized): Secondary | ICD-10-CM | POA: Insufficient documentation

## 2017-11-17 DIAGNOSIS — F0390 Unspecified dementia without behavioral disturbance: Secondary | ICD-10-CM | POA: Diagnosis not present

## 2017-11-17 LAB — BASIC METABOLIC PANEL
Anion gap: 9 (ref 5–15)
BUN: 27 mg/dL — AB (ref 8–23)
CHLORIDE: 101 mmol/L (ref 98–111)
CO2: 30 mmol/L (ref 22–32)
CREATININE: 1.02 mg/dL — AB (ref 0.44–1.00)
Calcium: 9 mg/dL (ref 8.9–10.3)
GFR calc Af Amer: 60 mL/min — ABNORMAL LOW (ref >60)
GFR calc non Af Amer: 52 mL/min — ABNORMAL LOW (ref >60)
Glucose, Bld: 216 mg/dL — ABNORMAL HIGH (ref 70–99)
Potassium: 3.5 mmol/L (ref 3.5–5.1)
SODIUM: 140 mmol/L (ref 135–145)

## 2017-11-21 ENCOUNTER — Encounter: Payer: Self-pay | Admitting: Internal Medicine

## 2017-11-21 ENCOUNTER — Non-Acute Institutional Stay (SKILLED_NURSING_FACILITY): Payer: Medicare Other | Admitting: Internal Medicine

## 2017-11-21 DIAGNOSIS — I482 Chronic atrial fibrillation, unspecified: Secondary | ICD-10-CM

## 2017-11-21 DIAGNOSIS — Z794 Long term (current) use of insulin: Secondary | ICD-10-CM

## 2017-11-21 DIAGNOSIS — E1140 Type 2 diabetes mellitus with diabetic neuropathy, unspecified: Secondary | ICD-10-CM

## 2017-11-21 DIAGNOSIS — L03115 Cellulitis of right lower limb: Secondary | ICD-10-CM | POA: Diagnosis not present

## 2017-11-21 DIAGNOSIS — R600 Localized edema: Secondary | ICD-10-CM

## 2017-11-21 NOTE — Progress Notes (Signed)
Location:   [facility] Room Number: 113/W Place of Service:  SNF [phone]) Provider:  Veleta Miners MD  Celedonio Savage, MD  Patient Care Team: Celedonio Savage, MD as PCP - General (Family Medicine) Rothbart, Cristopher Estimable, MD (Cardiology) Lendon Colonel, NP as Nurse Practitioner (Nurse Practitioner)  Extended Emergency Contact Information Primary Emergency Contact: [facility] Address: [address]          [place], [place] [address] [name] of [place] Phone: [phone] Mobile Phone: [phone] Relation: Daughter  Code Status:  DNR Goals of care: Advanced Directive information Advanced Directives 11/21/2017  Does Patient Have a Medical Advance Directive? Yes  Type of Advance Directive Out of facility DNR (pink MOST or yellow form)  Does patient want to make changes to medical advance directive? No - Patient declined  Copy of [place] in Chart? No - copy requested  Pre-existing out of facility DNR order (yellow form or pink MOST form) -     Chief Complaint  Patient presents with  . Acute Visit    Patient c/o Right Leg Redness    HPI:  Pt is a 77 y.o. female seen today for an acute visit for Recurrent Right Leg Cellulitis. Patient has h/o Diabetes mellitus type 2 , atrial fibrillation on chronic anticoagulation, Venous stasis, Dementia with Behavior problems, S/P fall leading to Ocular floor fracture.Cellulitis oF LE.and recent GI bleed s/p Colonoscopy with Polypectomy. Patient has a history of recurrent right leg cellulitis.  Nurses wanted me to see her again today as her right leg is swollen tender and red and warm. Patient does not have any fever or chills.  She has gained around 3 pounds again and her weight today is 212 lbs. I had started patient on metolazone a month ago to see if that helps her swelling and recurrent cellulitis. Patient was taken off it when she was not feeling well and eating well due to upper respiratory  tract infection.  Past Medical History:  Diagnosis Date  . Allergy   . Atrial fibrillation ([place]) 08/2011   First diagnosed in 08/2011; duration of arrhythmia is uncertain  . Bilateral lower extremity edema   . Chronic diarrhea    diverticulosis  . COPD (chronic obstructive pulmonary disease) ([place])   . Dementia   . Diabetes mellitus, type 2 (HCC)    Diabetic neuropathy  . Dyspnea on exertion    pedal edema  . Gout   . Headache(784.0)    twice weekly  . Hyperlipidemia   . Hypertension   . Osteopenia    DEXA scan 01/2010  . Palpitations   . Seasonal allergies   . Stress incontinence   . Vertigo    Past Surgical History:  Procedure Laterality Date  . APPENDECTOMY    . BREAST BIOPSY  2002   Right  . CESAREAN SECTION     x 2  . CHOLECYSTECTOMY    . COLONOSCOPY  2007   Negative screening study  . COLONOSCOPY WITH PROPOFOL N/A 09/18/2017   Procedure: COLONOSCOPY WITH PROPOFOL;  Surgeon: Daneil Dolin, MD;  [facility];  Service: Endoscopy;  Laterality: N/A;  11:00am  . HAMMER TOE SURGERY     Bilateral hammer toe amputation  . INCISIONAL HERNIA REPAIR    . KNEE ARTHROSCOPY W/ MENISCAL REPAIR  2007   Bilateral  . POLYPECTOMY  09/18/2017   Procedure: POLYPECTOMY;  Surgeon: Daneil Dolin, MD;  [facility];  Service: Endoscopy;;  colon  . UMBILICAL HERNIA REPAIR      Allergies  Allergen Reactions  . Codeine Anaphylaxis and Hives  . Morphine And Related Anaphylaxis and Hives  . Penicillins Anaphylaxis  . Ace Inhibitors Cough    Outpatient Encounter Medications as of 11/21/2017  Medication Sig  . acetaminophen (TYLENOL) 325 MG tablet Take 650 mg by mouth every 6 (six) hours as needed.   Marland Kitchen alendronate (FOSAMAX) 70 MG tablet Give 1 tablet by mouth on Sunday. Take with a full glass of water on an empty stomach.  Marland Kitchen apixaban (ELIQUIS) 5 MG TABS tablet Take 5 mg by mouth 2 (two) times daily.   . carboxymethylcellulose (REFRESH PLUS) 0.5 % SOLN Place 1  drop into both eyes 4 (four) times daily.  . cholecalciferol (VITAMIN D) 1000 UNITS tablet Take 1,000 Units by mouth daily.  . citalopram (CELEXA) 20 MG tablet Take 1 tablet (20 mg total) by mouth daily.  . digoxin (LANOXIN) 0.125 MG tablet Take 1 tablet by mouth once a day (HOLD FOR AP UNDER 60)  . furosemide (LASIX) 20 MG tablet Take 40 mg by mouth daily.   . insulin NPH-regular Human (NOVOLIN 70/30) (70-30) 100 UNIT/ML injection Inject 25 Units into the skin daily with supper.   . insulin NPH-regular Human (NOVOLIN 70/30) (70-30) 100 UNIT/ML injection Inject 45 Units into the skin daily with breakfast.   . Insulin Pen Needle (EASY TOUCH PEN NEEDLES) 31G X 8 MM MISC Use twice a day  . memantine (NAMENDA) 10 MG tablet Take 10 mg by mouth 2 (two) times daily.  . metoprolol tartrate (LOPRESSOR) 25 MG tablet Take 75 mg by mouth 2 (two) times daily.  . Multiple Vitamin (MULTIVITAMIN WITH MINERALS) TABS Take 1 tablet by mouth every morning.  . pantoprazole (PROTONIX) 40 MG tablet Take 40 mg by mouth daily.  . polyethylene glycol (MIRALAX / GLYCOLAX) packet Take 17 g by mouth daily.  . potassium chloride (MICRO-K) 10 MEQ CR capsule Take 20 mEq by mouth daily. Potassium chloride extended release capsule, give 20 meq by mouth three times a day  . pravastatin (PRAVACHOL) 20 MG tablet Take 20 mg by mouth daily. Take along with 40 mg to = 60 mg  . pravastatin (PRAVACHOL) 40 MG tablet Take 40 mg by mouth daily. Take along with 20 mg to = 60 mg  . sitaGLIPtin-metformin (JANUMET) 50-500 MG tablet Take 1 tablet by mouth 2 (two) times daily with a meal.  . [DISCONTINUED] K-DUR 20 MEQ tablet TAKE 2 TABLETS BY MOUTH ONCE DAILY.  . [DISCONTINUED] metolazone (ZAROXOLYN) 2.5 MG tablet Take 2.5 mg by mouth daily. On Mon., Wed Fri   No facility-administered encounter medications on file as of 11/21/2017.      Review of Systems  Unable to perform ROS: Dementia    Immunization History  Administered Date(s)  Administered  . Influenza-Unspecified 02/04/2016, 02/03/2017  . Pneumococcal Conjugate-13 02/03/2017  . Pneumococcal-Unspecified 02/08/2016  . Tdap 01/19/2017   Pertinent  Health Maintenance Due  Topic Date Due  . URINE MICROALBUMIN  12/22/2017 (Originally 05/11/2017)  . INFLUENZA VACCINE  11/30/2017  . FOOT EXAM  01/25/2018  . OPHTHALMOLOGY EXAM  02/22/2018  . HEMOGLOBIN A1C  04/13/2018  . DEXA SCAN  Completed  . PNA vac Low Risk Adult  Completed   Fall Risk  01/19/2017 11/06/2015 09/29/2015 08/31/2015 06/30/2015  Falls in the past year? No Yes No No No  Number falls in past yr: - 2 or more - - -  Injury with Fall? - No - - -   Functional Status Survey:    Vitals:   11/21/17 1033  BP: 105/69  Pulse: 65  Resp: 18  Temp: (!) 97.2 F (36.2 C)  TempSrc: Oral   There is no height or weight on file to calculate BMI. Physical Exam  Constitutional: She appears well-developed and well-nourished.  HENT:  Head: Normocephalic.  Eyes: Pupils are equal, round, and reactive to light.  Neck: Neck supple.  Cardiovascular: Normal rate. An irregular rhythm present.  No murmur heard. Pulmonary/Chest: Effort normal and breath sounds normal. No respiratory distress. She has no wheezes.  Abdominal: Soft. She exhibits no distension. There is no tenderness. There is no guarding.  Musculoskeletal:  Right sided edema and Redness especially in the back of Shin with warmth Left side Mild Edema  Neurological: She is alert.  Skin: Skin is warm and dry.  Psychiatric: She has a normal mood and affect. Her behavior is normal. Thought content normal.    Labs reviewed: Recent Labs    11/13/17 0110 11/15/17 0700 11/17/17 0700  NA 140 138 140  K 3.7 3.3* 3.5  CL 100 100 101  CO2 31 28 30   GLUCOSE 186* 230* 216*  BUN 31* 31* 27*  CREATININE 1.33* 1.27* 1.02*  CALCIUM 9.0 8.5* 9.0   Recent Labs    08/08/17 1459 11/08/17 1625  AST 21 25  ALT 15 19  ALKPHOS 54 52  BILITOT 0.5 0.5  PROT 7.5  7.9  ALBUMIN 3.3* 3.3*   Recent Labs    08/21/17 0330  10/12/17 0958 10/19/17 0729 11/08/17 1625  WBC 8.7   < > 11.8* 9.1 9.8  NEUTROABS 4.8  --   --  5.2 5.6  HGB 10.8*   < > 12.7 11.6* 11.9*  HCT 36.1   < > 40.8 38.3 38.2  MCV 94.3   < > 89.9 90.8 90.5  PLT 242   < > 270 224 248   < > = values in this interval not displayed.   Lab Results  Component Value Date   TSH 3.269 11/08/2017   Lab Results  Component Value Date   HGBA1C 7.4 (H) 10/12/2017   Lab Results  Component Value Date   CHOL 156 05/16/2017   HDL 38 (L) 05/16/2017   LDLCALC 104 (H) 05/16/2017   TRIG 72 05/16/2017   CHOLHDL 4.1 05/16/2017    Significant Diagnostic Results in last 30 days:  No results found.  Assessment/Plan Recurrent right lower extremity cellulitis We will restart her on doxycycline for 10 days We will also restart her on metolazone and see if that helps her edema and infection Repeat BMP Diabetes mellitus Her blood sugars have been running high as her insulin was decreased also when she was not eating Will increase her 70/30 insulin to 50 units in the morning and 25 in the evening   Family/ staff Communication:   Labs/tests ordered:

## 2017-11-22 ENCOUNTER — Encounter (HOSPITAL_COMMUNITY)
Admission: RE | Admit: 2017-11-22 | Discharge: 2017-11-22 | Disposition: A | Discharge status: Home / Self Care | Payer: Medicare Other | Source: Skilled Nursing Facility | Attending: Internal Medicine | Admitting: Internal Medicine

## 2017-11-22 DIAGNOSIS — R262 Difficulty in walking, not elsewhere classified: Secondary | ICD-10-CM | POA: Insufficient documentation

## 2017-11-22 DIAGNOSIS — I5042 Chronic combined systolic (congestive) and diastolic (congestive) heart failure: Secondary | ICD-10-CM | POA: Insufficient documentation

## 2017-11-22 DIAGNOSIS — M6281 Muscle weakness (generalized): Secondary | ICD-10-CM | POA: Insufficient documentation

## 2017-11-22 DIAGNOSIS — E1140 Type 2 diabetes mellitus with diabetic neuropathy, unspecified: Secondary | ICD-10-CM | POA: Insufficient documentation

## 2017-11-22 LAB — URINALYSIS, ROUTINE W REFLEX MICROSCOPIC
BILIRUBIN URINE: NEGATIVE
Glucose, UA: NEGATIVE mg/dL
Hgb urine dipstick: NEGATIVE
KETONES UR: NEGATIVE mg/dL
Nitrite: NEGATIVE
PROTEIN: 30 mg/dL — AB
Specific Gravity, Urine: 1.018 (ref 1.005–1.030)
WBC, UA: >50 WBC/hpf — ABNORMAL HIGH (ref 0–5)
pH: 9 — ABNORMAL HIGH (ref 5.0–8.0)

## 2017-11-22 LAB — BASIC METABOLIC PANEL
ANION GAP: 7 (ref 5–15)
BUN: 20 mg/dL (ref 8–23)
CO2: 29 mmol/L (ref 22–32)
Calcium: 8.7 mg/dL — ABNORMAL LOW (ref 8.9–10.3)
Chloride: 102 mmol/L (ref 98–111)
Creatinine, Ser: 1 mg/dL (ref 0.44–1.00)
GFR calc Af Amer: >60 mL/min (ref >60)
GFR calc non Af Amer: 53 mL/min — ABNORMAL LOW (ref >60)
GLUCOSE: 199 mg/dL — AB (ref 70–99)
Potassium: 4 mmol/L (ref 3.5–5.1)
Sodium: 138 mmol/L (ref 135–145)

## 2017-11-24 ENCOUNTER — Encounter: Payer: Self-pay | Admitting: Internal Medicine

## 2017-11-24 ENCOUNTER — Non-Acute Institutional Stay (SKILLED_NURSING_FACILITY): Payer: Medicare Other | Admitting: Internal Medicine

## 2017-11-24 DIAGNOSIS — R609 Edema, unspecified: Secondary | ICD-10-CM | POA: Diagnosis not present

## 2017-11-24 DIAGNOSIS — Z794 Long term (current) use of insulin: Secondary | ICD-10-CM | POA: Diagnosis not present

## 2017-11-24 DIAGNOSIS — Z87440 Personal history of urinary (tract) infections: Secondary | ICD-10-CM

## 2017-11-24 DIAGNOSIS — L03115 Cellulitis of right lower limb: Secondary | ICD-10-CM

## 2017-11-24 DIAGNOSIS — E1140 Type 2 diabetes mellitus with diabetic neuropathy, unspecified: Secondary | ICD-10-CM | POA: Diagnosis not present

## 2017-11-24 LAB — URINE CULTURE

## 2017-11-24 NOTE — Progress Notes (Signed)
Location:   [facility] Room Number: 113/W Place of Service:  SNF [phone]) Provider:  Dionicia Abler, MD  Patient Care Team: Celedonio Savage, MD as PCP - General (Family Medicine) Rothbart, Cristopher Estimable, MD (Cardiology) Lendon Colonel, NP as Nurse Practitioner (Nurse Practitioner)  Extended Emergency Contact Information Primary Emergency Contact: [facility] Address: [address]          [place], [place] [address] [name] of [place] Phone: [phone] Mobile Phone: [phone] Relation: Daughter  Code Status:  DNR Goals of care: Advanced Directive information Advanced Directives 11/24/2017  Does Patient Have a Medical Advance Directive? Yes  Type of Advance Directive Out of facility DNR (pink MOST or yellow form)  Does patient want to make changes to medical advance directive? No - Patient declined  Copy of [place] in Chart? No - copy requested  Pre-existing out of facility DNR order (yellow form or pink MOST form) -     Chief Complaint  Patient presents with  . Acute Visit    Patient is being seen for F/U UTI  Also follow-up of right leg cellulitis--and leg edema  HPI:  Pt is a 77 y.o. female seen today for an acute visit for possible UTI as well as follow-up of right leg cellulitis and leg edema.  Patient has h/o Diabetes mellitus type 2 , atrial fibrillation on chronic anticoagulation, Venous stasis, Dementia with Behavior problems, S/P fall leading to Ocular floor fracture.Cellulitis oF LE.and recent GI bleed s/p Colonoscopy with Polypectomy  She does have a history of recurrent right leg cellulitis since she did develop some increased warmth of her right leg recently and was restarted doxycycline and this appears to be helping this appears improved per nursing today.  She also has some increased edema she is on Lasix she was started on metolazone 3 times a week earlier this week.  The edema appears to be  improved as well.  She also complained apparently is some increased urinary frequency and back pain earlier this week a culture was done which has grown out greater than 100,000 colonies of Proteus- she is not really complaining of back pain this afternoon  she is afebrile appears to be at her baseline mentally somewhat confused but very pleasantly so    Past Medical History:  Diagnosis Date  . Allergy   . Atrial fibrillation ([place]) 08/2011   First diagnosed in 08/2011; duration of arrhythmia is uncertain  . Bilateral lower extremity edema   . Chronic diarrhea    diverticulosis  . COPD (chronic obstructive pulmonary disease) ([place])   . Dementia   . Diabetes mellitus, type 2 (HCC)    Diabetic neuropathy  . Dyspnea on exertion    pedal edema  . Gout   . Headache(784.0)    twice weekly  . Hyperlipidemia   . Hypertension   . Osteopenia    DEXA scan 01/2010  . Palpitations   . Seasonal allergies   . Stress incontinence   . Vertigo    Past Surgical History:  Procedure Laterality Date  . APPENDECTOMY    . BREAST BIOPSY  2002   Right  . CESAREAN SECTION     x 2  . CHOLECYSTECTOMY    . COLONOSCOPY  2007   Negative screening study  . COLONOSCOPY WITH PROPOFOL N/A 09/18/2017   Procedure: COLONOSCOPY WITH PROPOFOL;  Surgeon: Daneil Dolin, MD;  [facility];  Service: Endoscopy;  Laterality: N/A;  11:00am  . HAMMER TOE SURGERY     Bilateral hammer toe amputation  . INCISIONAL HERNIA REPAIR    . KNEE ARTHROSCOPY W/ MENISCAL REPAIR  2007   Bilateral  . POLYPECTOMY  09/18/2017   Procedure: POLYPECTOMY;  Surgeon: Daneil Dolin, MD;  [facility];  Service: Endoscopy;;  colon  . UMBILICAL HERNIA REPAIR      Allergies  Allergen Reactions  . Codeine Anaphylaxis and Hives  . Morphine And Related Anaphylaxis and Hives  . Penicillins Anaphylaxis  . Ace Inhibitors Cough    Outpatient Encounter Medications as of 11/24/2017  Medication Sig  . acetaminophen  (TYLENOL) 325 MG tablet Take 650 mg by mouth every 6 (six) hours as needed.   Marland Kitchen alendronate (FOSAMAX) 70 MG tablet Give 1 tablet by mouth on Sunday. Take with a full glass of water on an empty stomach.  Marland Kitchen apixaban (ELIQUIS) 5 MG TABS tablet Take 5 mg by mouth 2 (two) times daily.   . carboxymethylcellulose (REFRESH PLUS) 0.5 % SOLN Place 1 drop into both eyes 4 (four) times daily.  . cholecalciferol (VITAMIN D) 1000 UNITS tablet Take 1,000 Units by mouth daily.  . citalopram (CELEXA) 20 MG tablet Take 1 tablet (20 mg total) by mouth daily.  . digoxin (LANOXIN) 0.125 MG tablet Take 1 tablet by mouth once a day (HOLD FOR AP UNDER 60)  . doxycycline (VIBRAMYCIN) 100 MG capsule Take 100 mg by mouth 2 (two) times daily.  . furosemide (LASIX) 20 MG tablet Take 40 mg by mouth daily.   . insulin NPH-regular Human (NOVOLIN 70/30) (70-30) 100 UNIT/ML injection Inject 25 Units into the skin daily with supper.   . insulin NPH-regular Human (NOVOLIN 70/30) (70-30) 100 UNIT/ML injection Inject 50 Units into the skin daily with breakfast.   . Insulin Pen Needle (EASY TOUCH PEN NEEDLES) 31G X 8 MM MISC Use twice a day  . memantine (NAMENDA) 10 MG tablet Take 10 mg by mouth 2 (two) times daily.  . metolazone (ZAROXOLYN) 2.5 MG tablet Take 2.5 mg by mouth 2 (two) times daily. Take on Mon., Fri.  . metoprolol tartrate (LOPRESSOR) 25 MG tablet Take 75 mg by mouth 2 (two) times daily.  . Multiple Vitamin (MULTIVITAMIN WITH MINERALS) TABS Take 1 tablet by mouth every morning.  . pantoprazole (PROTONIX) 40 MG tablet Take 40 mg by mouth daily.  . polyethylene glycol (MIRALAX / GLYCOLAX) packet Take 17 g by mouth daily.  . potassium chloride (MICRO-K) 10 MEQ CR capsule Take 20 mEq by mouth daily. Potassium chloride extended release capsule, give 20 meq by mouth three times a day  . pravastatin (PRAVACHOL) 20 MG tablet Take 20 mg by mouth daily. Take along with 40 mg to = 60 mg  . pravastatin (PRAVACHOL) 40 MG tablet Take  40 mg by mouth daily. Take along with 20 mg to = 60 mg  . Probiotic Product (RISA-BID PROBIOTIC) TABS Give 1 tablet by mouth once a day  . sitaGLIPtin-metformin (JANUMET) 50-500 MG tablet Take 1 tablet by mouth 2 (two) times daily with a meal.  . [DISCONTINUED] K-DUR 20 MEQ tablet TAKE 2 TABLETS BY MOUTH ONCE DAILY.   No facility-administered encounter medications on file as of 11/24/2017.     Review of Systems This is limited secondary to dementia but she is not complaining of any fever chills back pain today apparently has had some increased urinary frequency at times.  She appears to be doing well Immunization History  Administered Date(s) Administered  . Influenza-Unspecified 02/04/2016, 02/03/2017  . Pneumococcal Conjugate-13 02/03/2017  . Pneumococcal-Unspecified 02/08/2016  . Tdap 01/19/2017   Pertinent  Health Maintenance Due  Topic Date Due  . URINE MICROALBUMIN  12/22/2017 (Originally 05/11/2017)  . INFLUENZA VACCINE  11/30/2017  . FOOT EXAM  01/25/2018  . OPHTHALMOLOGY EXAM  02/22/2018  . HEMOGLOBIN A1C  04/13/2018  . DEXA SCAN  Completed  . PNA vac Low Risk Adult  Completed   Fall Risk  01/19/2017 11/06/2015 09/29/2015 08/31/2015 06/30/2015  Falls in the past year? No Yes No No No  Number falls in past yr: - 2 or more - - -  Injury with Fall? - No - - -   Functional Status Survey:    Vitals:   11/24/17 1114  BP: 114/74  Pulse: 75  Resp: 20  Temp: 98.2 F (36.8 C)  TempSrc: Oral    Physical Exam   In general this is a well-developed elderly female in no distress sitting comfortably in her wheelchair she is pleasantly confused which is her baseline.  Her skin is warm and dry the erythema on her right leg appears to be increasing it is extending from her ankle up to mid shin but is somewhat pale does not appear all that warm is less tender this appears to be improving.  She also has some mild erythema venous stasis changes left lower extremity.  Oropharynx is  clear mucous membranes moist.  Eyes sclera and conjunctive are clear visual acuity appears to be intact.  Chest is clear to auscultation with somewhat shallow air entry there is no labored breathing.  Heart is regular irregular rate and rhythm without murmur gallop or rub her edem  Abdomen is obese soft nontender with positive bowel sounds  Musculoskeletal-strength  Appears . to be intact all 4 extremities ambulates in a wheelchair   Neurologic is grossly intact her speech is clear she is at baseline.  Psych she is pleasant smiling jovial which is her baseline is oriented to self does follow simple verbal commands Labs reviewed: Recent Labs    11/15/17 0700 11/17/17 0700 11/22/17 0700  NA 138 140 138  K 3.3* 3.5 4.0  CL 100 101 102  CO2 28 30 29   GLUCOSE 230* 216* 199*  BUN 31* 27* 20  CREATININE 1.27* 1.02* 1.00  CALCIUM 8.5* 9.0 8.7*   Recent Labs    08/08/17 1459 11/08/17 1625  AST 21 25  ALT 15 19  ALKPHOS 54 52  BILITOT 0.5 0.5  PROT 7.5 7.9  ALBUMIN 3.3* 3.3*   Recent Labs    08/21/17 0330  10/12/17 0958 10/19/17 0729 11/08/17 1625  WBC 8.7   < > 11.8* 9.1 9.8  NEUTROABS 4.8  --   --  5.2 5.6  HGB 10.8*   < > 12.7 11.6* 11.9*  HCT 36.1   < > 40.8 38.3 38.2  MCV 94.3   < > 89.9 90.8 90.5  PLT 242   < > 270 224 248   < > = values in this interval not displayed.   Lab Results  Component Value Date   TSH 3.269 11/08/2017   Lab Results  Component Value Date   HGBA1C 7.4 (H) 10/12/2017   Lab Results  Component Value Date   CHOL 156 05/16/2017   HDL 38 (L) 05/16/2017   LDLCALC 104 (H) 05/16/2017   TRIG 72 05/16/2017   CHOLHDL 4.1 05/16/2017    Significant Diagnostic Results in last 30  days:  No results found.  Assessment/Plan  #1 possible UTI- challenging situation since patient does have a penicillin allergy- I did speak with her daughter who states it is an anaphylactic reaction- sensitivities for the Proteus appear to be largely  penicillin or cephalosporin based--she does not appear overtly symptomatic today no fever chills at times apparently will complain of back pain iandurinary frequency- at this point will push fluids and monitor- if she develops fever chills  Or  increaded dysuria notify provider--monitor vital signs with pulse ox to shift for 72 hours- and also will add cranberry juice twice daily Again would like to avoid any acute anaphylaxis --stated to her daughter sometimes the cure could be worse than the disease and she did state understanding  - #2- history of right leg cellulitis this appears resolving on the doxycycline which she tolerates well.  3.  Lower extremity edema this appears to be improving as well on the Lasix and metolazone 3 times a week-labs have shown stability will update this next week.  4.  Diabetes type 2--her 7030 was increased earlier this week back to her baseline of 50 units in the morning 25 units in the evening--this had been reduced previously since patient had not been eating as well but now she is back to her baseline- blood sugar this morning was 160 it was 195 yesterday- 5 PM blood sugar was 271 last night-this appears to be somewhat improving on the increased doses will monitor.  I do not see evidence of hypoglycemia-she also is on Janumet 50-500   twice daily  CPT- 99310- of note greater than 35 minutes spent assessing patient reviewing her chart and labs- discussing her status with nursing staff as well as with her daughter via phonein regards to antibiotic actions vs monitoring - and coordinating and formulating a plan of care for numerous diagnoses- of note greater than 50% of time spent coordinating plan of care with input as noted above

## 2017-11-27 ENCOUNTER — Encounter (HOSPITAL_COMMUNITY)
Admission: RE | Admit: 2017-11-27 | Discharge: 2017-11-27 | Disposition: A | Discharge status: Home / Self Care | Payer: Medicare Other | Source: Skilled Nursing Facility | Attending: Internal Medicine | Admitting: Internal Medicine

## 2017-11-27 DIAGNOSIS — R262 Difficulty in walking, not elsewhere classified: Secondary | ICD-10-CM | POA: Insufficient documentation

## 2017-11-27 DIAGNOSIS — M6281 Muscle weakness (generalized): Secondary | ICD-10-CM | POA: Insufficient documentation

## 2017-11-27 DIAGNOSIS — I5042 Chronic combined systolic (congestive) and diastolic (congestive) heart failure: Secondary | ICD-10-CM | POA: Diagnosis not present

## 2017-11-27 DIAGNOSIS — E1140 Type 2 diabetes mellitus with diabetic neuropathy, unspecified: Secondary | ICD-10-CM | POA: Insufficient documentation

## 2017-11-27 LAB — CBC WITH DIFFERENTIAL/PLATELET
BASOS ABS: 0 10*3/uL (ref 0.0–0.1)
BASOS PCT: 0 %
Eosinophils Absolute: 1.7 10*3/uL — ABNORMAL HIGH (ref 0.0–0.7)
Eosinophils Relative: 17 %
HCT: 39.6 % (ref 36.0–46.0)
HEMOGLOBIN: 12.3 g/dL (ref 12.0–15.0)
LYMPHS PCT: 18 %
Lymphs Abs: 1.8 10*3/uL (ref 0.7–4.0)
MCH: 27.6 pg (ref 26.0–34.0)
MCHC: 31.1 g/dL (ref 30.0–36.0)
MCV: 89 fL (ref 78.0–100.0)
MONO ABS: 1 10*3/uL (ref 0.1–1.0)
Monocytes Relative: 10 %
NEUTROS ABS: 5.3 10*3/uL (ref 1.7–7.7)
NEUTROS PCT: 55 %
Platelets: 233 10*3/uL (ref 150–400)
RBC: 4.45 MIL/uL (ref 3.87–5.11)
RDW: 15.4 % (ref 11.5–15.5)
WBC: 9.7 10*3/uL (ref 4.0–10.5)

## 2017-11-27 LAB — BASIC METABOLIC PANEL
ANION GAP: 8 (ref 5–15)
BUN: 28 mg/dL — ABNORMAL HIGH (ref 8–23)
CALCIUM: 9.1 mg/dL (ref 8.9–10.3)
CO2: 29 mmol/L (ref 22–32)
Chloride: 102 mmol/L (ref 98–111)
Creatinine, Ser: 1.15 mg/dL — ABNORMAL HIGH (ref 0.44–1.00)
GFR, EST AFRICAN AMERICAN: 52 mL/min — AB (ref >60)
GFR, EST NON AFRICAN AMERICAN: 45 mL/min — AB (ref >60)
Glucose, Bld: 103 mg/dL — ABNORMAL HIGH (ref 70–99)
POTASSIUM: 3.5 mmol/L (ref 3.5–5.1)
Sodium: 139 mmol/L (ref 135–145)

## 2017-12-04 ENCOUNTER — Encounter: Payer: Self-pay | Admitting: Internal Medicine

## 2017-12-04 ENCOUNTER — Non-Acute Institutional Stay (SKILLED_NURSING_FACILITY): Payer: Medicare Other | Admitting: Internal Medicine

## 2017-12-04 ENCOUNTER — Encounter (HOSPITAL_COMMUNITY)
Admission: RE | Admit: 2017-12-04 | Discharge: 2017-12-04 | Disposition: A | Discharge status: Home / Self Care | Payer: Medicare Other | Source: Skilled Nursing Facility | Attending: Internal Medicine | Admitting: Internal Medicine

## 2017-12-04 DIAGNOSIS — L03115 Cellulitis of right lower limb: Secondary | ICD-10-CM

## 2017-12-04 DIAGNOSIS — M6281 Muscle weakness (generalized): Secondary | ICD-10-CM | POA: Diagnosis not present

## 2017-12-04 DIAGNOSIS — Z794 Long term (current) use of insulin: Secondary | ICD-10-CM | POA: Diagnosis not present

## 2017-12-04 DIAGNOSIS — I5042 Chronic combined systolic (congestive) and diastolic (congestive) heart failure: Secondary | ICD-10-CM | POA: Insufficient documentation

## 2017-12-04 DIAGNOSIS — E1140 Type 2 diabetes mellitus with diabetic neuropathy, unspecified: Secondary | ICD-10-CM

## 2017-12-04 DIAGNOSIS — R262 Difficulty in walking, not elsewhere classified: Secondary | ICD-10-CM | POA: Insufficient documentation

## 2017-12-04 DIAGNOSIS — Z87440 Personal history of urinary (tract) infections: Secondary | ICD-10-CM | POA: Diagnosis not present

## 2017-12-04 DIAGNOSIS — R609 Edema, unspecified: Secondary | ICD-10-CM

## 2017-12-04 LAB — BASIC METABOLIC PANEL
Anion gap: 5 (ref 5–15)
BUN: 24 mg/dL — ABNORMAL HIGH (ref 8–23)
CALCIUM: 9.1 mg/dL (ref 8.9–10.3)
CO2: 29 mmol/L (ref 22–32)
CREATININE: 1.07 mg/dL — AB (ref 0.44–1.00)
Chloride: 105 mmol/L (ref 98–111)
GFR calc Af Amer: 57 mL/min — ABNORMAL LOW (ref >60)
GFR, EST NON AFRICAN AMERICAN: 49 mL/min — AB (ref >60)
Glucose, Bld: 52 mg/dL — ABNORMAL LOW (ref 70–99)
Potassium: 4.1 mmol/L (ref 3.5–5.1)
SODIUM: 139 mmol/L (ref 135–145)

## 2017-12-04 NOTE — Progress Notes (Signed)
Location:   [facility] Room Number: 113/W Place of Service:  SNF [phone]) Provider:  Dionicia Abler, MD  Patient Care Team: Celedonio Savage, MD as PCP - General (Family Medicine) Rothbart, Cristopher Estimable, MD (Cardiology) Lendon Colonel, NP as Nurse Practitioner (Nurse Practitioner)  Extended Emergency Contact Information Primary Emergency Contact: [facility] Address: [address]          [place], [place] [address] [name] of [place] Phone: [phone] Mobile Phone: [phone] Relation: Daughter  Code Status:  DNR Goals of care: Advanced Directive information Advanced Directives 12/04/2017  Does Patient Have a Medical Advance Directive? Yes  Type of Advance Directive Out of facility DNR (pink MOST or yellow form)  Does patient want to make changes to medical advance directive? No - Patient declined  Copy of [place] in Chart? No - copy requested  Pre-existing out of facility DNR order (yellow form or pink MOST form) -    Chief complaint- acute visit follow-up CBGs- also follow-up of right leg cellulitis-  HPI:  Pt is a 77 y.o. female seen today for an acute visit for concern for possible hypoglycemia  --She is a type II diabetic on Novolin 70/30 twice a day- at one point she was not eating well we reduced her dose but this has been titrated up since her appetite has improved.  She is currently on 50 units in the morning and 30 units p.m.  Listed blood sugars appear to be fairly stable with morning sugars in the lower to mid 100s-5 PM sugars show some more variation ranging mainly from the mid 100s to the mid 200s.  Hemoglobin A1c in June was 7.4 baseline does appear to be more in the mid sixes to low 7's  She is also on Janumet 50-500.  However metabolic panel today shows a glucose of 52 which would indicate at times she is probably hypoglycemic early in the morning.  She also was treated recently for right leg  cellulitis with doxycycline she has completed this and this appears to have improved  At one point we also did a urine culture which grew out Proteus- however she had a penicillin allergy which is anaphylactic and mostly antibiotics were either IV or cephalosporin or penicillin based- she was asymptomatic and this was treated conservatively with encouraging fluids.  She continues not complain of any dysuria remains asymptomatic.  Currently she is sitting in her wheelchair comfortably has no complaints vital signs appear to be stable     Past Medical History:  Diagnosis Date  . Allergy   . Atrial fibrillation ([place]) 08/2011   First diagnosed in 08/2011; duration of arrhythmia is uncertain  . Bilateral lower extremity edema   . Chronic diarrhea    diverticulosis  . COPD (chronic obstructive pulmonary disease) ([place])   . Dementia   . Diabetes mellitus, type 2 (HCC)    Diabetic neuropathy  . Dyspnea on exertion    pedal edema  . Gout   . Headache(784.0)    twice weekly  . Hyperlipidemia   . Hypertension   . Osteopenia    DEXA scan 01/2010  . Palpitations   . Seasonal allergies   . Stress incontinence   . Vertigo    Past Surgical History:  Procedure Laterality Date  . APPENDECTOMY    . BREAST BIOPSY  2002   Right  . CESAREAN SECTION     x 2  . CHOLECYSTECTOMY    .  COLONOSCOPY  2007   Negative screening study  . COLONOSCOPY WITH PROPOFOL N/A 09/18/2017   Procedure: COLONOSCOPY WITH PROPOFOL;  Surgeon: Daneil Dolin, MD;  [facility];  Service: Endoscopy;  Laterality: N/A;  11:00am  . HAMMER TOE SURGERY     Bilateral hammer toe amputation  . INCISIONAL HERNIA REPAIR    . KNEE ARTHROSCOPY W/ MENISCAL REPAIR  2007   Bilateral  . POLYPECTOMY  09/18/2017   Procedure: POLYPECTOMY;  Surgeon: Daneil Dolin, MD;  [facility];  Service: Endoscopy;;  colon  . UMBILICAL HERNIA REPAIR      Allergies  Allergen Reactions  . Codeine Anaphylaxis and Hives    . Morphine And Related Anaphylaxis and Hives  . Penicillins Anaphylaxis  . Ace Inhibitors Cough    Outpatient Encounter Medications as of 12/04/2017  Medication Sig  . acetaminophen (TYLENOL) 325 MG tablet Take 650 mg by mouth every 6 (six) hours as needed.   Marland Kitchen alendronate (FOSAMAX) 70 MG tablet Give 1 tablet by mouth on Sunday. Take with a full glass of water on an empty stomach.  Marland Kitchen apixaban (ELIQUIS) 5 MG TABS tablet Take 5 mg by mouth 2 (two) times daily.   . carboxymethylcellulose (REFRESH PLUS) 0.5 % SOLN Place 1 drop into both eyes 4 (four) times daily.  . cholecalciferol (VITAMIN D) 1000 UNITS tablet Take 1,000 Units by mouth daily.  . citalopram (CELEXA) 20 MG tablet Take 1 tablet (20 mg total) by mouth daily.  . digoxin (LANOXIN) 0.125 MG tablet Take 1 tablet by mouth once a day (HOLD FOR AP UNDER 60)  . furosemide (LASIX) 20 MG tablet Take 40 mg by mouth daily.   . insulin NPH-regular Human (NOVOLIN 70/30) (70-30) 100 UNIT/ML injection Inject 25 Units into the skin daily with supper.   . insulin NPH-regular Human (NOVOLIN 70/30) (70-30) 100 UNIT/ML injection Inject 50 Units into the skin daily with breakfast.   . Insulin Pen Needle (EASY TOUCH PEN NEEDLES) 31G X 8 MM MISC Use twice a day  . memantine (NAMENDA) 10 MG tablet Take 10 mg by mouth 2 (two) times daily.  . metoprolol tartrate (LOPRESSOR) 25 MG tablet Take 75 mg by mouth 2 (two) times daily.  . Multiple Vitamin (MULTIVITAMIN WITH MINERALS) TABS Take 1 tablet by mouth every morning.  . pantoprazole (PROTONIX) 40 MG tablet Take 40 mg by mouth daily.  . polyethylene glycol (MIRALAX / GLYCOLAX) packet Take 17 g by mouth daily.  . potassium chloride (MICRO-K) 10 MEQ CR capsule Take 20 mEq by mouth daily. Potassium chloride extended release capsule, give 20 meq by mouth three times a day  . pravastatin (PRAVACHOL) 20 MG tablet Take 20 mg by mouth daily. Take along with 40 mg to = 60 mg  . pravastatin (PRAVACHOL) 40 MG tablet  Take 40 mg by mouth daily. Take along with 20 mg to = 60 mg  . Probiotic Product (RISA-BID PROBIOTIC) TABS Give 1 tablet by mouth once a day  . sitaGLIPtin-metformin (JANUMET) 50-500 MG tablet Take 1 tablet by mouth 2 (two) times daily with a meal.  . [DISCONTINUED] doxycycline (VIBRAMYCIN) 100 MG capsule Take 100 mg by mouth 2 (two) times daily.  . [DISCONTINUED] K-DUR 20 MEQ tablet TAKE 2 TABLETS BY MOUTH ONCE DAILY.  . [DISCONTINUED] metolazone (ZAROXOLYN) 2.5 MG tablet Take 2.5 mg by mouth 2 (two) times daily. Take on Mon., Fri.   No facility-administered encounter medications on file as of 12/04/2017.  Review of Systems   Continues to be limited secondary to dementia nursing does not report any issues- she denies any dysuria fever or chills says she feels like she is doing well continues to be in her usual good spirits with some confusion  Immunization History  Administered Date(s) Administered  . Influenza-Unspecified 02/04/2016, 02/03/2017  . Pneumococcal Conjugate-13 02/03/2017  . Pneumococcal-Unspecified 02/08/2016  . Tdap 01/19/2017   Pertinent  Health Maintenance Due  Topic Date Due  . URINE MICROALBUMIN  12/22/2017 (Originally 05/11/2017)  . INFLUENZA VACCINE  01/04/2018 (Originally 11/30/2017)  . FOOT EXAM  01/25/2018  . OPHTHALMOLOGY EXAM  02/22/2018  . HEMOGLOBIN A1C  04/13/2018  . DEXA SCAN  Completed  . PNA vac Low Risk Adult  Completed   Fall Risk  01/19/2017 11/06/2015 09/29/2015 08/31/2015 06/30/2015  Falls in the past year? No Yes No No No  Number falls in past yr: - 2 or more - - -  Injury with Fall? - No - - -   Functional Status Survey:    Vitals:   12/04/17 1431  BP: 128/74  Pulse: 88   Temperature is 98.7 respirations of 18.  Weight is 199 pounds Physical Exam  In general this is a pleasant elderly female no distress sitting comfortably in her wheelchair.  Skin-warm and dry the suspected cellulitis right lower leg appears to be largely resolved- any  remaining erythema is quite pale and cool to touch I suspect this is more venous stasis edema-her leg is definitely less tender.  She also has venous stasis changes to her left leg   Eyes visual acuity appears to be intact sclera and conjunctive are clear  Oropharynx is clear mucous membranes moist   Chest is clear to auscultation there is no labored breathing  Heart is irregular irregular rate and rhythm without murmur gallop or rub edema appears to be relatively unchanged from previous exam and improved since metolazone was initiated   Abdomen is obese soft nontender with positive bowel sounds   Musculoskeletal--moves all her extremities at baseline with chronic lower extremity weakness   Neurologic is grossly intact her speech is clear no lateralizing findings  Psych she is oriented to self pleasant cooperative does have cognitive deficits but appears to be at baseline    Labs reviewed: Recent Labs    11/22/17 0700 11/27/17 0700 12/04/17 0700  NA 138 139 139  K 4.0 3.5 4.1  CL 102 102 105  CO2 29 29 29   GLUCOSE 199* 103* 52*  BUN 20 28* 24*  CREATININE 1.00 1.15* 1.07*  CALCIUM 8.7* 9.1 9.1   Recent Labs    08/08/17 1459 11/08/17 1625  AST 21 25  ALT 15 19  ALKPHOS 54 52  BILITOT 0.5 0.5  PROT 7.5 7.9  ALBUMIN 3.3* 3.3*   Recent Labs    10/19/17 0729 11/08/17 1625 11/27/17 0700  WBC 9.1 9.8 9.7  NEUTROABS 5.2 5.6 5.3  HGB 11.6* 11.9* 12.3  HCT 38.3 38.2 39.6  MCV 90.8 90.5 89.0  PLT 224 248 233   Lab Results  Component Value Date   TSH 3.269 11/08/2017   Lab Results  Component Value Date   HGBA1C 7.4 (H) 10/12/2017   Lab Results  Component Value Date   CHOL 156 05/16/2017   HDL 38 (L) 05/16/2017   LDLCALC 104 (H) 05/16/2017   TRIG 72 05/16/2017   CHOLHDL 4.1 05/16/2017    Significant Diagnostic Results in last 30 days:  No results found.  Assessment/Plan  #1-diabetes type 2 he is on Novolin 70/30 twice a day 50 units in the  morning and 30 in the PM There is concern as noted above for possible some potential for a.m. hypoglycemia owith we will decrease the Novolin 70/30 p.m. dose down to 25 units the blood sugar 52 on this morning's metabolic panel  Will decrease the p.m. dose of Novolin 70/30 down to 25 units and monitor #2-   #2- right leg cellulitis- this appears to have resolved status post doxycycline.  3.-  Question UTI- as noted above this was treated conservatively since she was asymptomatic and she continues to be asymptomatic.  4.  History of increased edema-metolazone was recently added twice a day twice daily in addition to the Lasix she recurrently received at 40 mg a day- her weight has trended down edema appears to be improved metabolic panel shows stability-we will update a metabolic panel in 1 week to ensure stability  [id number]- of note --.assessment and plan was discussed with Dr. Lyndel Safe

## 2017-12-11 ENCOUNTER — Encounter (HOSPITAL_COMMUNITY)
Admission: RE | Admit: 2017-12-11 | Discharge: 2017-12-11 | Disposition: A | Discharge status: Home / Self Care | Payer: Medicare Other | Source: Skilled Nursing Facility | Attending: *Deleted | Admitting: *Deleted

## 2017-12-11 ENCOUNTER — Encounter: Payer: Self-pay | Admitting: Internal Medicine

## 2017-12-11 ENCOUNTER — Non-Acute Institutional Stay (SKILLED_NURSING_FACILITY): Payer: Medicare Other | Admitting: Internal Medicine

## 2017-12-11 DIAGNOSIS — I482 Chronic atrial fibrillation, unspecified: Secondary | ICD-10-CM

## 2017-12-11 DIAGNOSIS — E1140 Type 2 diabetes mellitus with diabetic neuropathy, unspecified: Secondary | ICD-10-CM

## 2017-12-11 DIAGNOSIS — I872 Venous insufficiency (chronic) (peripheral): Secondary | ICD-10-CM

## 2017-12-11 DIAGNOSIS — I10 Essential (primary) hypertension: Secondary | ICD-10-CM | POA: Diagnosis not present

## 2017-12-11 DIAGNOSIS — M6281 Muscle weakness (generalized): Secondary | ICD-10-CM | POA: Diagnosis not present

## 2017-12-11 DIAGNOSIS — Z794 Long term (current) use of insulin: Secondary | ICD-10-CM | POA: Diagnosis not present

## 2017-12-11 DIAGNOSIS — I5042 Chronic combined systolic (congestive) and diastolic (congestive) heart failure: Secondary | ICD-10-CM | POA: Diagnosis not present

## 2017-12-11 DIAGNOSIS — R262 Difficulty in walking, not elsewhere classified: Secondary | ICD-10-CM | POA: Diagnosis not present

## 2017-12-11 LAB — BASIC METABOLIC PANEL
ANION GAP: 10 (ref 5–15)
BUN: 25 mg/dL — ABNORMAL HIGH (ref 8–23)
CO2: 33 mmol/L — ABNORMAL HIGH (ref 22–32)
Calcium: 9 mg/dL (ref 8.9–10.3)
Chloride: 97 mmol/L — ABNORMAL LOW (ref 98–111)
Creatinine, Ser: 1.24 mg/dL — ABNORMAL HIGH (ref 0.44–1.00)
GFR calc Af Amer: 47 mL/min — ABNORMAL LOW (ref >60)
GFR, EST NON AFRICAN AMERICAN: 41 mL/min — AB (ref >60)
GLUCOSE: 163 mg/dL — AB (ref 70–99)
POTASSIUM: 3 mmol/L — AB (ref 3.5–5.1)
Sodium: 140 mmol/L (ref 135–145)

## 2017-12-11 NOTE — Progress Notes (Signed)
Location:    [facility] Room Number: 113/W Place of Service:  SNF [phone]) Provider: Veleta Miners MD  Celedonio Savage, MD  Patient Care Team: Celedonio Savage, MD as PCP - General (Family Medicine) Rothbart, Cristopher Estimable, MD (Cardiology) Lendon Colonel, NP as Nurse Practitioner (Nurse Practitioner)  Extended Emergency Contact Information Primary Emergency Contact: [facility] Address: [address]          [place], [place] [address] [name] of [place] Phone: [phone] Mobile Phone: [phone] Relation: Daughter  Code Status: DNR  Goals of care: Advanced Directive information Advanced Directives 12/11/2017  Does Patient Have a Medical Advance Directive? Yes  Type of Advance Directive Out of facility DNR (pink MOST or yellow form)  Does patient want to make changes to medical advance directive? No - Patient declined  Copy of [place] in Chart? No - copy requested  Pre-existing out of facility DNR order (yellow form or pink MOST form) -     Chief Complaint  Patient presents with  . Medical Management of Chronic Issues    Patient is being seen for a routine visit of medical management    HPI:  Pt is a 77 y.o. female seen today for medical management of chronic diseases.   Patient has h/o Diabetes mellitus type 2 , atrial fibrillation on chronic anticoagulation, Venous stasis, Dementia with Behavior problems, S/P fall leading to Ocular floor fracture.Recurrent Cellulitis oF Right  LE.and recent GI bleed s/p Colonoscopy with Polypectomy  Patient has a history of recurrent right leg cellulitis.  Started on metolazone to go to see if it helps her swelling. Has lost almost 12 pounds since then.  She is down to 199 lbs.  Though she continues to have swelling and redness in her right leg. Previous Dopplers have been negative  Otherwise patient has no nursing issues.  Insulin dose was also reduced recently because of morning  hypoglycemia. Patient continues to have good appetite.  She continues to be full care due to her dementia  Past Medical History:  Diagnosis Date  . Allergy   . Atrial fibrillation ([place]) 08/2011   First diagnosed in 08/2011; duration of arrhythmia is uncertain  . Bilateral lower extremity edema   . Chronic diarrhea    diverticulosis  . COPD (chronic obstructive pulmonary disease) ([place])   . Dementia   . Diabetes mellitus, type 2 (HCC)    Diabetic neuropathy  . Dyspnea on exertion    pedal edema  . Gout   . Headache(784.0)    twice weekly  . Hyperlipidemia   . Hypertension   . Osteopenia    DEXA scan 01/2010  . Palpitations   . Seasonal allergies   . Stress incontinence   . Vertigo    Past Surgical History:  Procedure Laterality Date  . APPENDECTOMY    . BREAST BIOPSY  2002   Right  . CESAREAN SECTION     x 2  . CHOLECYSTECTOMY    . COLONOSCOPY  2007   Negative screening study  . COLONOSCOPY WITH PROPOFOL N/A 09/18/2017   Procedure: COLONOSCOPY WITH PROPOFOL;  Surgeon: Daneil Dolin, MD;  [facility];  Service: Endoscopy;  Laterality: N/A;  11:00am  . HAMMER TOE SURGERY     Bilateral hammer toe amputation  . INCISIONAL HERNIA REPAIR    . KNEE ARTHROSCOPY W/ MENISCAL REPAIR  2007   Bilateral  . POLYPECTOMY  09/18/2017   Procedure: POLYPECTOMY;  Surgeon: Manus Rudd  M, MD;  [facility];  Service: Endoscopy;;  colon  . UMBILICAL HERNIA REPAIR      Allergies  Allergen Reactions  . Codeine Anaphylaxis and Hives  . Morphine And Related Anaphylaxis and Hives  . Penicillins Anaphylaxis  . Ace Inhibitors Cough    Outpatient Encounter Medications as of 12/11/2017  Medication Sig  . acetaminophen (TYLENOL) 325 MG tablet Take 650 mg by mouth every 6 (six) hours as needed.   Marland Kitchen alendronate (FOSAMAX) 70 MG tablet Give 1 tablet by mouth on Sunday. Take with a full glass of water on an empty stomach.  Marland Kitchen apixaban (ELIQUIS) 5 MG TABS tablet Take 5 mg by  mouth 2 (two) times daily.   . carboxymethylcellulose (REFRESH PLUS) 0.5 % SOLN Place 1 drop into both eyes 4 (four) times daily.  . cholecalciferol (VITAMIN D) 1000 UNITS tablet Take 1,000 Units by mouth daily.  . citalopram (CELEXA) 20 MG tablet Take 1 tablet (20 mg total) by mouth daily.  . digoxin (LANOXIN) 0.125 MG tablet Take 1 tablet by mouth once a day (HOLD FOR AP UNDER 60)  . furosemide (LASIX) 20 MG tablet Take 40 mg by mouth daily.   . insulin NPH-regular Human (NOVOLIN 70/30) (70-30) 100 UNIT/ML injection Inject 20 Units into the skin daily with supper.   . insulin NPH-regular Human (NOVOLIN 70/30) (70-30) 100 UNIT/ML injection Inject 50 Units into the skin daily with breakfast.   . Insulin Pen Needle (EASY TOUCH PEN NEEDLES) 31G X 8 MM MISC Use twice a day  . memantine (NAMENDA) 10 MG tablet Take 10 mg by mouth 2 (two) times daily.  . metolazone (ZAROXOLYN) 2.5 MG tablet Take 2.5 mg by mouth 2 (two) times daily. Mon. Fri  . metoprolol tartrate (LOPRESSOR) 25 MG tablet Take 75 mg by mouth 2 (two) times daily.  . Multiple Vitamin (MULTIVITAMIN WITH MINERALS) TABS Take 1 tablet by mouth every morning.  . pantoprazole (PROTONIX) 40 MG tablet Take 40 mg by mouth daily.  . polyethylene glycol (MIRALAX / GLYCOLAX) packet Take 17 g by mouth daily.  . potassium chloride (MICRO-K) 10 MEQ CR capsule Take 20 mEq by mouth daily. Potassium chloride extended release capsule, give 20 meq by mouth three times a day  . pravastatin (PRAVACHOL) 20 MG tablet Take 20 mg by mouth daily. Take along with 40 mg to = 60 mg  . pravastatin (PRAVACHOL) 40 MG tablet Take 40 mg by mouth daily. Take along with 20 mg to = 60 mg  . sitaGLIPtin-metformin (JANUMET) 50-500 MG tablet Take 1 tablet by mouth 2 (two) times daily with a meal.  . [DISCONTINUED] K-DUR 20 MEQ tablet TAKE 2 TABLETS BY MOUTH ONCE DAILY.  . [DISCONTINUED] Probiotic Product (RISA-BID PROBIOTIC) TABS Give 1 tablet by mouth once a day   No  facility-administered encounter medications on file as of 12/11/2017.      Review of Systems  Unable to perform ROS: Dementia    Immunization History  Administered Date(s) Administered  . Influenza-Unspecified 02/04/2016, 02/03/2017  . Pneumococcal Conjugate-13 02/03/2017  . Pneumococcal-Unspecified 02/08/2016  . Tdap 01/19/2017   Pertinent  Health Maintenance Due  Topic Date Due  . INFLUENZA VACCINE  01/04/2018 (Originally 11/30/2017)  . FOOT EXAM  01/25/2018  . OPHTHALMOLOGY EXAM  02/22/2018  . HEMOGLOBIN A1C  04/13/2018  . URINE MICROALBUMIN  08/23/2018  . DEXA SCAN  Completed  . PNA vac Low Risk Adult  Completed   Fall Risk  01/19/2017 11/06/2015 09/29/2015  08/31/2015 06/30/2015  Falls in the past year? No Yes No No No  Number falls in past yr: - 2 or more - - -  Injury with Fall? - No - - -   Functional Status Survey:    Vitals:   12/11/17 1143  BP: 132/71  Pulse: 66  Resp: 19  Temp: (!) 97.1 F (36.2 C)  TempSrc: Oral  SpO2: 97%  Weight: 199 lb (90.3 kg)  Height: 5\' 6"  (1.676 m)   Body mass index is 32.12 kg/m. Physical Exam  Constitutional: She appears well-developed and well-nourished.  HENT:  Head: Normocephalic.  Eyes: Pupils are equal, round, and reactive to light.  Neck: Neck supple.  Cardiovascular: Normal rate. An irregular rhythm present.  No murmur heard. Pulmonary/Chest: Effort normal and breath sounds normal. No respiratory distress. She has no wheezes.  Abdominal: Soft. She exhibits no distension. There is no tenderness. There is no guarding.  Musculoskeletal:  Bilateral Edema Right More then Left. Also Has redness in right LE   Neurological: She is alert.  Skin: Skin is warm and dry.  Psychiatric: She has a normal mood and affect. Her behavior is normal. Thought content normal.    Labs reviewed: Recent Labs    11/27/17 0700 12/04/17 0700 12/11/17 0600  NA 139 139 140  K 3.5 4.1 3.0*  CL 102 105 97*  CO2 29 29 33*  GLUCOSE 103* 52*  163*  BUN 28* 24* 25*  CREATININE 1.15* 1.07* 1.24*  CALCIUM 9.1 9.1 9.0   Recent Labs    08/08/17 1459 11/08/17 1625  AST 21 25  ALT 15 19  ALKPHOS 54 52  BILITOT 0.5 0.5  PROT 7.5 7.9  ALBUMIN 3.3* 3.3*   Recent Labs    10/19/17 0729 11/08/17 1625 11/27/17 0700  WBC 9.1 9.8 9.7  NEUTROABS 5.2 5.6 5.3  HGB 11.6* 11.9* 12.3  HCT 38.3 38.2 39.6  MCV 90.8 90.5 89.0  PLT 224 248 233   Lab Results  Component Value Date   TSH 3.269 11/08/2017   Lab Results  Component Value Date   HGBA1C 7.4 (H) 10/12/2017   Lab Results  Component Value Date   CHOL 156 05/16/2017   HDL 38 (L) 05/16/2017   LDLCALC 104 (H) 05/16/2017   TRIG 72 05/16/2017   CHOLHDL 4.1 05/16/2017    Significant Diagnostic Results in last 30 days:  No results found.  Assessment/Plan Essential hypertension Patient's blood pressure stable on Lasix ,lopressor, metolazone She was taken off Lotensin due to low blood pressure  Chronic A. fib Rate control on Lopressor and digoxin Dig level was normal in 07/19  Type 2 diabetes with diabetes neuropathy A1c 7.4 in 06/19 Blood sugars some peak at around 300 in evening But she has had hypoglycemia in Mornings on Increased  dose Will continue to monitor for now. Also On Janumet  Diastolic CHF with chronic lower extremity edema Patient on Lasix 60 mg and Metolazone Continue compression stockings Will Supplement Potassium Renal Function Stable Continue to watch her redness for now. 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 Hgb stable   Family/ staff Communication:   Labs/tests ordered:

## 2017-12-18 ENCOUNTER — Encounter (HOSPITAL_COMMUNITY)
Admission: RE | Admit: 2017-12-18 | Discharge: 2017-12-18 | Disposition: A | Discharge status: Home / Self Care | Payer: Medicare Other | Source: Skilled Nursing Facility | Attending: *Deleted | Admitting: *Deleted

## 2017-12-18 DIAGNOSIS — I5042 Chronic combined systolic (congestive) and diastolic (congestive) heart failure: Secondary | ICD-10-CM | POA: Diagnosis not present

## 2017-12-18 DIAGNOSIS — R262 Difficulty in walking, not elsewhere classified: Secondary | ICD-10-CM | POA: Diagnosis not present

## 2017-12-18 DIAGNOSIS — M6281 Muscle weakness (generalized): Secondary | ICD-10-CM | POA: Diagnosis not present

## 2017-12-18 DIAGNOSIS — E1140 Type 2 diabetes mellitus with diabetic neuropathy, unspecified: Secondary | ICD-10-CM | POA: Diagnosis not present

## 2017-12-18 LAB — CBC
HCT: 38.1 % (ref 36.0–46.0)
Hemoglobin: 11.8 g/dL — ABNORMAL LOW (ref 12.0–15.0)
MCH: 27.8 pg (ref 26.0–34.0)
MCHC: 31 g/dL (ref 30.0–36.0)
MCV: 89.6 fL (ref 78.0–100.0)
PLATELETS: 217 10*3/uL (ref 150–400)
RBC: 4.25 MIL/uL (ref 3.87–5.11)
RDW: 15.3 % (ref 11.5–15.5)
WBC: 9.1 10*3/uL (ref 4.0–10.5)

## 2017-12-18 LAB — LIPID PANEL
CHOL/HDL RATIO: 4.8 ratio
Cholesterol: 149 mg/dL (ref 0–200)
HDL: 31 mg/dL — ABNORMAL LOW (ref >40)
LDL CALC: 87 mg/dL (ref 0–99)
Triglycerides: 157 mg/dL — ABNORMAL HIGH (ref <150)
VLDL: 31 mg/dL (ref 0–40)

## 2017-12-18 LAB — BASIC METABOLIC PANEL
Anion gap: 11 (ref 5–15)
BUN: 26 mg/dL — AB (ref 8–23)
CHLORIDE: 98 mmol/L (ref 98–111)
CO2: 29 mmol/L (ref 22–32)
CREATININE: 1.25 mg/dL — AB (ref 0.44–1.00)
Calcium: 8.8 mg/dL — ABNORMAL LOW (ref 8.9–10.3)
GFR calc Af Amer: 47 mL/min — ABNORMAL LOW (ref >60)
GFR calc non Af Amer: 40 mL/min — ABNORMAL LOW (ref >60)
Glucose, Bld: 238 mg/dL — ABNORMAL HIGH (ref 70–99)
Potassium: 3.8 mmol/L (ref 3.5–5.1)
Sodium: 138 mmol/L (ref 135–145)

## 2017-12-19 ENCOUNTER — Encounter: Payer: Self-pay | Admitting: Internal Medicine

## 2017-12-19 ENCOUNTER — Non-Acute Institutional Stay (SKILLED_NURSING_FACILITY): Payer: Medicare Other | Admitting: Internal Medicine

## 2017-12-19 DIAGNOSIS — Z794 Long term (current) use of insulin: Secondary | ICD-10-CM | POA: Diagnosis not present

## 2017-12-19 DIAGNOSIS — E1140 Type 2 diabetes mellitus with diabetic neuropathy, unspecified: Secondary | ICD-10-CM | POA: Diagnosis not present

## 2017-12-19 NOTE — Progress Notes (Signed)
error    This encounter was created in error - please disregard.

## 2017-12-19 NOTE — Progress Notes (Signed)
Location:  [facility] Room Number: Z858I Place of Service:  SNF [phone]) Provider:  Dr. Jefm Bryant, MD  Patient Care Team: Celedonio Savage, MD as PCP - General (Family Medicine) Rothbart, Cristopher Estimable, MD (Cardiology) Lendon Colonel, NP as Nurse Practitioner (Nurse Practitioner)  Extended Emergency Contact Information Primary Emergency Contact: [facility] Address: [address]          [place], [place] [address] [name] of [place] Phone: [phone] Mobile Phone: [phone] Relation: Daughter  Code Status:  DNR Goals of care: Advanced Directive information Advanced Directives 12/11/2017  Does Patient Have a Medical Advance Directive? Yes  Type of Advance Directive Out of facility DNR (pink MOST or yellow form)  Does patient want to make changes to medical advance directive? No - Patient declined  Copy of [place] in Chart? No - copy requested  Pre-existing out of facility DNR order (yellow form or pink MOST form) -     Chief Complaint  Patient presents with  . Acute Visit    CBG Elevated    HPI:  Pt is a 77 y.o. female seen today for an acute visit for elevated CBG.  Patient has h/o Diabetes mellitus type 2 , atrial fibrillation on chronic anticoagulation, Venous stasis, Dementia with Behavior problems, S/P fall leading to Ocular floor fracture.Recurrent Cellulitis oF Right  LE.and recent GI bleed s/p Colonoscopy with Polypectomy Patient unable to give me any history due to her Dementia. But Nurses have been concerned as patient BS have been running more then 300 in morning and evening. She does not have any fever or chills. Her right leg swelling is stable with Mild redness. Her appetite is good. Her insulin dose was adjusted before due to Morning Hypoglycemia.  Past Medical History:  Diagnosis Date  . Allergy   . Atrial fibrillation ([place]) 08/2011   First diagnosed in 08/2011; duration of arrhythmia is  uncertain  . Bilateral lower extremity edema   . Chronic diarrhea    diverticulosis  . COPD (chronic obstructive pulmonary disease) ([place])   . Dementia   . Diabetes mellitus, type 2 (HCC)    Diabetic neuropathy  . Dyspnea on exertion    pedal edema  . Gout   . Headache(784.0)    twice weekly  . Hyperlipidemia   . Hypertension   . Osteopenia    DEXA scan 01/2010  . Palpitations   . Seasonal allergies   . Stress incontinence   . Vertigo    Past Surgical History:  Procedure Laterality Date  . APPENDECTOMY    . BREAST BIOPSY  2002   Right  . CESAREAN SECTION     x 2  . CHOLECYSTECTOMY    . COLONOSCOPY  2007   Negative screening study  . COLONOSCOPY WITH PROPOFOL N/A 09/18/2017   Procedure: COLONOSCOPY WITH PROPOFOL;  Surgeon: Daneil Dolin, MD;  [facility];  Service: Endoscopy;  Laterality: N/A;  11:00am  . HAMMER TOE SURGERY     Bilateral hammer toe amputation  . INCISIONAL HERNIA REPAIR    . KNEE ARTHROSCOPY W/ MENISCAL REPAIR  2007   Bilateral  . POLYPECTOMY  09/18/2017   Procedure: POLYPECTOMY;  Surgeon: Daneil Dolin, MD;  [facility];  Service: Endoscopy;;  colon  . UMBILICAL HERNIA REPAIR      Allergies  Allergen Reactions  . Codeine Anaphylaxis and Hives  . Morphine And Related Anaphylaxis and Hives  . Penicillins  Anaphylaxis  . Ace Inhibitors Cough    Outpatient Encounter Medications as of 12/19/2017  Medication Sig  . acetaminophen (TYLENOL) 325 MG tablet Take 650 mg by mouth every 6 (six) hours as needed.   Marland Kitchen alendronate (FOSAMAX) 70 MG tablet Give 1 tablet by mouth on Sunday. Take with a full glass of water on an empty stomach.  Marland Kitchen apixaban (ELIQUIS) 5 MG TABS tablet Take 5 mg by mouth 2 (two) times daily.   . carboxymethylcellulose (REFRESH PLUS) 0.5 % SOLN Place 1 drop into both eyes 4 (four) times daily.  . cholecalciferol (VITAMIN D) 1000 UNITS tablet Take 1,000 Units by mouth daily.  . citalopram (CELEXA) 20 MG tablet  Take 1 tablet (20 mg total) by mouth daily.  . digoxin (LANOXIN) 0.125 MG tablet Take 1 tablet by mouth once a day (HOLD FOR AP UNDER 60)  . furosemide (LASIX) 20 MG tablet Take 40 mg by mouth daily.   Marland Kitchen guaiFENesin-dextromethorphan (ROBITUSSIN DM) 100-10 MG/5ML syrup Take 15 mLs by mouth every 6 (six) hours as needed for cough.  . insulin NPH-regular Human (NOVOLIN 70/30) (70-30) 100 UNIT/ML injection Inject 25 Units into the skin daily with supper.   . insulin NPH-regular Human (NOVOLIN 70/30) (70-30) 100 UNIT/ML injection Inject 55 Units into the skin daily with breakfast.   . Insulin Pen Needle (EASY TOUCH PEN NEEDLES) 31G X 8 MM MISC Use twice a day  . memantine (NAMENDA) 10 MG tablet Take 10 mg by mouth 2 (two) times daily.  . metolazone (ZAROXOLYN) 2.5 MG tablet Take 2.5 mg by mouth 2 (two) times daily. Mon. Fri  . metoprolol tartrate (LOPRESSOR) 25 MG tablet Take 75 mg by mouth 2 (two) times daily.  . Multiple Vitamin (MULTIVITAMIN WITH MINERALS) TABS Take 1 tablet by mouth every morning.  . pantoprazole (PROTONIX) 40 MG tablet Take 40 mg by mouth daily.  . polyethylene glycol (MIRALAX / GLYCOLAX) packet Take 17 g by mouth daily.  . potassium chloride (MICRO-K) 10 MEQ CR capsule Take 20 mEq by mouth daily. Potassium chloride extended release capsule, give 20 meq by mouth three times a day  . potassium chloride SA (K-DUR,KLOR-CON) 20 MEQ tablet Take 40 mEq by mouth 2 (two) times daily.  . pravastatin (PRAVACHOL) 20 MG tablet Take 20 mg by mouth daily. Take along with 40 mg to = 60 mg  . pravastatin (PRAVACHOL) 40 MG tablet Take 40 mg by mouth daily. Take along with 20 mg to = 60 mg  . sitaGLIPtin-metformin (JANUMET) 50-500 MG tablet Take 1 tablet by mouth 2 (two) times daily with a meal.   No facility-administered encounter medications on file as of 12/19/2017.     Review of Systems  Unable to perform ROS: Dementia    Immunization History  Administered Date(s) Administered  .  Influenza-Unspecified 02/04/2016, 02/03/2017  . Pneumococcal Conjugate-13 02/03/2017  . Pneumococcal-Unspecified 02/08/2016  . Tdap 01/19/2017   Pertinent  Health Maintenance Due  Topic Date Due  . INFLUENZA VACCINE  01/04/2018 (Originally 11/30/2017)  . FOOT EXAM  01/25/2018  . OPHTHALMOLOGY EXAM  02/22/2018  . HEMOGLOBIN A1C  04/13/2018  . URINE MICROALBUMIN  08/23/2018  . DEXA SCAN  Completed  . PNA vac Low Risk Adult  Completed   Fall Risk  01/19/2017 11/06/2015 09/29/2015 08/31/2015 06/30/2015  Falls in the past year? No Yes No No No  Number falls in past yr: - 2 or more - - -  Injury with Fall? - No - - -  Functional Status Survey:    Vitals:   12/19/17 1019  BP: 102/71  Pulse: 84  Resp: 17  Temp: (!) 97.5 F (36.4 C)  TempSrc: Oral  Weight: 199 lb (90.3 kg)  Height: 5\' 6"  (1.676 m)   Body mass index is 32.12 kg/m. Physical Exam  Constitutional: She appears well-developed and well-nourished.  HENT:  Head: Normocephalic.  Eyes: Pupils are equal, round, and reactive to light.  Neck: Neck supple.  Cardiovascular: Normal rate. An irregular rhythm present.  No murmur heard. Pulmonary/Chest: Effort normal and breath sounds normal. No respiratory distress. She has no wheezes.  Abdominal: Soft. She exhibits no distension. There is no tenderness. There is no guarding.  Musculoskeletal:  Bilateral Edema Right More then Left. Also Has redness in right LE   Neurological: She is alert.  Skin: Skin is warm and dry.  Psychiatric: She has a normal mood and affect. Her behavior is normal. Thought content normal.    Labs reviewed: Recent Labs    12/04/17 0700 12/11/17 0600 12/18/17 0700  NA 139 140 138  K 4.1 3.0* 3.8  CL 105 97* 98  CO2 29 33* 29  GLUCOSE 52* 163* 238*  BUN 24* 25* 26*  CREATININE 1.07* 1.24* 1.25*  CALCIUM 9.1 9.0 8.8*   Recent Labs    08/08/17 1459 11/08/17 1625  AST 21 25  ALT 15 19  ALKPHOS 54 52  BILITOT 0.5 0.5  PROT 7.5 7.9  ALBUMIN  3.3* 3.3*   Recent Labs    10/19/17 0729 11/08/17 1625 11/27/17 0700 12/18/17 0700  WBC 9.1 9.8 9.7 9.1  NEUTROABS 5.2 5.6 5.3  --   HGB 11.6* 11.9* 12.3 11.8*  HCT 38.3 38.2 39.6 38.1  MCV 90.8 90.5 89.0 89.6  PLT 224 248 233 217   Lab Results  Component Value Date   TSH 3.269 11/08/2017   Lab Results  Component Value Date   HGBA1C 7.4 (H) 10/12/2017   Lab Results  Component Value Date   CHOL 149 12/18/2017   HDL 31 (L) 12/18/2017   LDLCALC 87 12/18/2017   TRIG 157 (H) 12/18/2017   CHOLHDL 4.8 12/18/2017    Significant Diagnostic Results in last 30 days:  No results found.  Assessment/Plan Type 2 Diabetes With Neuropathy. Patients CBG are running above 300 with some above 250. Will discontinue her 70/30 and start her on Lantus long acting Insulin. Will start her on 35 units Q HS and Novolog Bolus 10 units with Meals. Continue Accu Checks  Continue Janumet  A1C was 7.4 in 06/19    Family/ staff Communication:   Labs/tests ordered:

## 2017-12-21 ENCOUNTER — Encounter (HOSPITAL_COMMUNITY)
Admission: RE | Admit: 2017-12-21 | Discharge: 2017-12-21 | Disposition: A | Discharge status: Home / Self Care | Payer: Medicare Other | Source: Skilled Nursing Facility | Attending: Internal Medicine | Admitting: Internal Medicine

## 2017-12-21 DIAGNOSIS — E876 Hypokalemia: Secondary | ICD-10-CM | POA: Diagnosis not present

## 2017-12-21 DIAGNOSIS — M6281 Muscle weakness (generalized): Secondary | ICD-10-CM | POA: Diagnosis not present

## 2017-12-21 DIAGNOSIS — I5042 Chronic combined systolic (congestive) and diastolic (congestive) heart failure: Secondary | ICD-10-CM | POA: Diagnosis not present

## 2017-12-21 DIAGNOSIS — E1140 Type 2 diabetes mellitus with diabetic neuropathy, unspecified: Secondary | ICD-10-CM | POA: Diagnosis not present

## 2017-12-21 DIAGNOSIS — F329 Major depressive disorder, single episode, unspecified: Secondary | ICD-10-CM | POA: Insufficient documentation

## 2017-12-21 LAB — BASIC METABOLIC PANEL
Anion gap: 9 (ref 5–15)
BUN: 23 mg/dL (ref 8–23)
CALCIUM: 8.8 mg/dL — AB (ref 8.9–10.3)
CHLORIDE: 98 mmol/L (ref 98–111)
CO2: 29 mmol/L (ref 22–32)
CREATININE: 1.1 mg/dL — AB (ref 0.44–1.00)
GFR calc non Af Amer: 47 mL/min — ABNORMAL LOW (ref >60)
GFR, EST AFRICAN AMERICAN: 55 mL/min — AB (ref >60)
Glucose, Bld: 266 mg/dL — ABNORMAL HIGH (ref 70–99)
Potassium: 3.5 mmol/L (ref 3.5–5.1)
SODIUM: 136 mmol/L (ref 135–145)

## 2017-12-25 ENCOUNTER — Other Ambulatory Visit (HOSPITAL_COMMUNITY)
Admission: AD | Admit: 2017-12-25 | Discharge: 2017-12-25 | Disposition: A | Discharge status: Home / Self Care | Payer: Medicare Other | Source: Skilled Nursing Facility | Attending: Internal Medicine | Admitting: Internal Medicine

## 2017-12-25 DIAGNOSIS — E876 Hypokalemia: Secondary | ICD-10-CM | POA: Diagnosis not present

## 2017-12-25 LAB — BASIC METABOLIC PANEL
Anion gap: 12 (ref 5–15)
BUN: 22 mg/dL (ref 8–23)
CALCIUM: 9.2 mg/dL (ref 8.9–10.3)
CHLORIDE: 93 mmol/L — AB (ref 98–111)
CO2: 30 mmol/L (ref 22–32)
CREATININE: 1.43 mg/dL — AB (ref 0.44–1.00)
GFR calc Af Amer: 40 mL/min — ABNORMAL LOW (ref >60)
GFR calc non Af Amer: 34 mL/min — ABNORMAL LOW (ref >60)
Glucose, Bld: 292 mg/dL — ABNORMAL HIGH (ref 70–99)
Potassium: 3.2 mmol/L — ABNORMAL LOW (ref 3.5–5.1)
Sodium: 135 mmol/L (ref 135–145)

## 2017-12-26 ENCOUNTER — Non-Acute Institutional Stay (SKILLED_NURSING_FACILITY): Payer: Medicare Other | Admitting: Internal Medicine

## 2017-12-26 ENCOUNTER — Encounter: Payer: Self-pay | Admitting: Internal Medicine

## 2017-12-26 DIAGNOSIS — I482 Chronic atrial fibrillation, unspecified: Secondary | ICD-10-CM

## 2017-12-26 DIAGNOSIS — E1140 Type 2 diabetes mellitus with diabetic neuropathy, unspecified: Secondary | ICD-10-CM

## 2017-12-26 DIAGNOSIS — Z794 Long term (current) use of insulin: Secondary | ICD-10-CM

## 2017-12-26 DIAGNOSIS — N289 Disorder of kidney and ureter, unspecified: Secondary | ICD-10-CM | POA: Diagnosis not present

## 2017-12-26 DIAGNOSIS — R600 Localized edema: Secondary | ICD-10-CM | POA: Diagnosis not present

## 2017-12-26 DIAGNOSIS — E876 Hypokalemia: Secondary | ICD-10-CM | POA: Diagnosis not present

## 2017-12-26 NOTE — Progress Notes (Signed)
Location:    [place] Room Number: 113/W Place of Service:  SNF [phone]) Provider:  Dionicia Abler, MD  Patient Care Team: Celedonio Savage, MD as PCP - General (Family Medicine) Rothbart, Cristopher Estimable, MD (Cardiology) Lendon Colonel, NP as Nurse Practitioner (Nurse Practitioner)  Extended Emergency Contact Information Primary Emergency Contact: [facility] Address: [address]          [place], [place] [address] [name] of [place] Phone: [phone] Mobile Phone: [phone] Relation: Daughter  Code Status:  DNR Goals of care: Advanced Directive information Advanced Directives 12/26/2017  Does Patient Have a Medical Advance Directive? Yes  Type of Advance Directive Out of facility DNR (pink MOST or yellow form)  Does patient want to make changes to medical advance directive? No - Patient declined  Copy of [place] in Chart? No - copy requested  Pre-existing out of facility DNR order (yellow form or pink MOST form) -     Chief Complaint  Patient presents with  . Acute Visit    Patinet is being seen for Hypokalemia    HPI:  Pt is a 77 y.o. female seen today for an acute visit for follow-up of hypokalemia  Patient has a history of diastolic CHF she is on Lasix 40 mg a day as well as metolazone 2.5 mg twice a week.  She has been on supplemental potassium but at times she tends to run somewhat low.  She had been on 40 mEq a day but appears potassium is trending down it was 3.2 on lab done yesterday previously was 3.5 and before that 3.8- she has been on various doses of potassium  She received Dose of 40 mEq of potassium last night and she received 40 mEq this morning which was her baseline dose-she also will get 20 M EQ's later today.  I do note on lab done yesterday that her creatinine has slowly risen up to 1.43 previously had been more in the 1.1- 1.25 range-  In addition to diastolic CHF she also has a  history of type 2 diabetes-atrial fibrillation-venous stasis edema-dementia- history of ocular floor fracture in the distant past-and recurrent cellulitis of right lower extremity- also recent GI bleed status post colonoscopy with polypectomy-hemoglobin has been stable.    We recently have adjusted her insulin dose also because of some elevated blood sugars at one point was fairly consistently in the 300s- her 70/30 was discontinued in favor of Basaglar insulin this has been titrated up most recently increased to 34 units a day 3 days ago.  She also is on NovoLog 10 units at lunch and dinner if CBG is greater than 150 as well as Janumet 50-500 twice daily.  Blood sugars last 3 days have been in the lower 200s in the morning- lower 300s at noon- in the higher 200s low 300s at 4 PM and in the higher 100s low 200s at at bedtime-  Currently she is lying in bed comfortably taking a nap she has no complaints nursing does not report any recent acute issues-vital signs appear to be stable    Past Medical History:  Diagnosis Date  . Allergy   . Atrial fibrillation ([place]) 08/2011   First diagnosed in 08/2011; duration of arrhythmia is uncertain  . Bilateral lower extremity edema   . Chronic diarrhea    diverticulosis  . COPD (chronic obstructive pulmonary disease) ([place])   . Dementia   . Diabetes mellitus, type 2 (  [place])    Diabetic neuropathy  . Dyspnea on exertion    pedal edema  . Gout   . Headache(784.0)    twice weekly  . Hyperlipidemia   . Hypertension   . Osteopenia    DEXA scan 01/2010  . Palpitations   . Seasonal allergies   . Stress incontinence   . Vertigo    Past Surgical History:  Procedure Laterality Date  . APPENDECTOMY    . BREAST BIOPSY  2002   Right  . CESAREAN SECTION     x 2  . CHOLECYSTECTOMY    . COLONOSCOPY  2007   Negative screening study  . COLONOSCOPY WITH PROPOFOL N/A 09/18/2017   Procedure: COLONOSCOPY WITH PROPOFOL;  Surgeon: Daneil Dolin, MD;   [facility];  Service: Endoscopy;  Laterality: N/A;  11:00am  . HAMMER TOE SURGERY     Bilateral hammer toe amputation  . INCISIONAL HERNIA REPAIR    . KNEE ARTHROSCOPY W/ MENISCAL REPAIR  2007   Bilateral  . POLYPECTOMY  09/18/2017   Procedure: POLYPECTOMY;  Surgeon: Daneil Dolin, MD;  [facility];  Service: Endoscopy;;  colon  . UMBILICAL HERNIA REPAIR      Allergies  Allergen Reactions  . Codeine Anaphylaxis and Hives  . Morphine And Related Anaphylaxis and Hives  . Penicillins Anaphylaxis  . Ace Inhibitors Cough    Outpatient Encounter Medications as of 12/26/2017  Medication Sig  . acetaminophen (TYLENOL) 325 MG tablet Take 650 mg by mouth every 6 (six) hours as needed.   Marland Kitchen alendronate (FOSAMAX) 70 MG tablet Give 1 tablet by mouth on Sunday. Take with a full glass of water on an empty stomach.  Marland Kitchen apixaban (ELIQUIS) 5 MG TABS tablet Take 5 mg by mouth 2 (two) times daily.   . carboxymethylcellulose (REFRESH PLUS) 0.5 % SOLN Place 1 drop into both eyes 4 (four) times daily.  . cholecalciferol (VITAMIN D) 1000 UNITS tablet Take 1,000 Units by mouth daily.  . citalopram (CELEXA) 20 MG tablet Take 1 tablet (20 mg total) by mouth daily.  . digoxin (LANOXIN) 0.125 MG tablet Take 1 tablet by mouth once a day (HOLD FOR AP UNDER 60)  . furosemide (LASIX) 20 MG tablet Take 40 mg by mouth daily.   . insulin aspart (NOVOLOG FLEXPEN) 100 UNIT/ML FlexPen Inject 10 Units into the skin 2 (two) times daily.  . Insulin Glargine (BASAGLAR KWIKPEN) 100 UNIT/ML SOPN Inject 34 Units into the skin at bedtime.  . Insulin Pen Needle (EASY TOUCH PEN NEEDLES) 31G X 8 MM MISC Use twice a day  . memantine (NAMENDA) 10 MG tablet Take 10 mg by mouth 2 (two) times daily.  . metolazone (ZAROXOLYN) 2.5 MG tablet Take 2.5 mg by mouth 2 (two) times daily. Mon. Fri  . metoprolol tartrate (LOPRESSOR) 25 MG tablet Take 75 mg by mouth 2 (two) times daily.  . Multiple Vitamin (MULTIVITAMIN  WITH MINERALS) TABS Take 1 tablet by mouth every morning.  Marland Kitchen omeprazole (PRILOSEC) 40 MG capsule Take 40 mg by mouth daily.  . polyethylene glycol (MIRALAX / GLYCOLAX) packet Take 17 g by mouth daily.  . potassium chloride (MICRO-K) 10 MEQ CR capsule Take 20 mEq by mouth daily. Potassium chloride extended release capsule, give 20 meq by mouth once an evening  . potassium chloride SA (K-DUR,KLOR-CON) 20 MEQ tablet Take 40 mEq by mouth daily.   . pravastatin (PRAVACHOL) 20 MG tablet Take 20 mg by mouth daily. Take  along with 40 mg to = 60 mg  . pravastatin (PRAVACHOL) 40 MG tablet Take 40 mg by mouth daily. Take along with 20 mg to = 60 mg  . sitaGLIPtin-metformin (JANUMET) 50-500 MG tablet Take 1 tablet by mouth 2 (two) times daily with a meal.  . [DISCONTINUED] guaiFENesin-dextromethorphan (ROBITUSSIN DM) 100-10 MG/5ML syrup Take 15 mLs by mouth every 6 (six) hours as needed for cough.  . [DISCONTINUED] insulin NPH-regular Human (NOVOLIN 70/30) (70-30) 100 UNIT/ML injection Inject 25 Units into the skin daily with supper.   . [DISCONTINUED] insulin NPH-regular Human (NOVOLIN 70/30) (70-30) 100 UNIT/ML injection Inject 55 Units into the skin daily with breakfast.   . [DISCONTINUED] pantoprazole (PROTONIX) 40 MG tablet Take 40 mg by mouth daily.   No facility-administered encounter medications on file as of 12/26/2017.     Review of Systems   Limited secondary to dementia provided by nursing as w  ll General she is not complaining of any fever chills--appears her weight has come down about 12 pounds over the past month  Skin is not complain of rashes or itching erythema appears to be baseline on lower extremities.  Head ears eyes nose mouth and throat no complaints of visual changes or sore throat.  Respiratory denies shortness of breath or cough.  Cardiac is not complaining of chest pain edema appears to be at baseline somewhat improved compared to several weeks ago she has compression hose  on  GI is not complaining of abdominal pain nausea vomiting diarrhea constipation  GU is not complaining of dysuria   Musculoskeletal is not complaining of any acute pain she does have tenderness to when her legs are palpated but this is not new  Neurologic is not complaining of dizziness headache numbness or syncope.    Psych is pleasant continues to be confused but cooperative with exam --nursing does not report any recent acute issues   Immunization History  Administered Date(s) Administered  . Influenza-Unspecified 02/04/2016, 02/03/2017  . Pneumococcal Conjugate-13 02/03/2017  . Pneumococcal-Unspecified 02/08/2016  . Tdap 01/19/2017   Pertinent  Health Maintenance Due  Topic Date Due  . INFLUENZA VACCINE  01/04/2018 (Originally 11/30/2017)  . FOOT EXAM  01/25/2018  . OPHTHALMOLOGY EXAM  02/22/2018  . HEMOGLOBIN A1C  04/13/2018  . URINE MICROALBUMIN  08/23/2018  . DEXA SCAN  Completed  . PNA vac Low Risk Adult  Completed   Fall Risk  01/19/2017 11/06/2015 09/29/2015 08/31/2015 06/30/2015  Falls in the past year? No Yes No No No  Number falls in past yr: - 2 or more - - -  Injury with Fall? - No - - -   Functional Status Survey:     There is 98.1 pulse is 60 respirations of 20 blood pressure 112/70--previously 110/70--- O2 saturations 98% on room ai  weight 199 pounds Physical Exam  In general this is a well-nourished elderly female no distress lying comfortably in bed.  Skin is warm and dry she has venous stasis changes lower extremities bilaterally I do not really see any increased concerning erythema  Eyes visual acuity appears to be intact sclera and conjunctive are clear.  Oropharynx is clear mucous membranes moist.  Chest is clear to auscultation with somewhat shallow air entry there is no labored breathing.  Heart is largely regular rate and rhythm with an occasional irregular beat she has chronic venous stasis edema she has compression hose on edema appears to  be improved over the past few weeks.  Abdomen is obese  soft nontender with positive bowel sounds  Musculoskeletal does move all extremities x4 at baseline with lower extremity weakness she does have bilateral edema a bit more on the right versus the left which is not new- she has chronic venous stasis erythema but I do not see evidence of cellulitis  Neurologic is grossly intact her speech is clear.  Psych she is oriented to self is pleasant and cooperative with exam Labs reviewed: Recent Labs    12/18/17 0700 12/21/17 0717 12/25/17 1800  NA 138 136 135  K 3.8 3.5 3.2*  CL 98 98 93*  CO2 29 29 30   GLUCOSE 238* 266* 292*  BUN 26* 23 22  CREATININE 1.25* 1.10* 1.43*  CALCIUM 8.8* 8.8* 9.2   Recent Labs    08/08/17 1459 11/08/17 1625  AST 21 25  ALT 15 19  ALKPHOS 54 52  BILITOT 0.5 0.5  PROT 7.5 7.9  ALBUMIN 3.3* 3.3*   Recent Labs    10/19/17 0729 11/08/17 1625 11/27/17 0700 12/18/17 0700  WBC 9.1 9.8 9.7 9.1  NEUTROABS 5.2 5.6 5.3  --   HGB 11.6* 11.9* 12.3 11.8*  HCT 38.3 38.2 39.6 38.1  MCV 90.8 90.5 89.0 89.6  PLT 224 248 233 217   Lab Results  Component Value Date   TSH 3.269 11/08/2017   Lab Results  Component Value Date   HGBA1C 7.4 (H) 10/12/2017   Lab Results  Component Value Date   CHOL 149 12/18/2017   HDL 31 (L) 12/18/2017   LDLCALC 87 12/18/2017   TRIG 157 (H) 12/18/2017   CHOLHDL 4.8 12/18/2017    Significant Diagnostic Results in last 30 days:  No results found.  Assessment/Plan  #1- history of hypokalemia- again this is being supplemented as noted above currently on 10 M EQ's a.m. and 20 mEq at night- will update a BMP Thursday, August 29  #2 mildly elevated creatinine- will cut back on her Lasix for 3 days to 20 mg- and update a metabolic panel again on Thursday to see where we stand- her edema has improved-her weight is down I suspect this is largely fluid related- again will wait further orders pending updated labs  She does  continue on metolazone twice a day on Mondays and Fridays  3.  Type 2 diabetes- blood sugars continue to be somewhat elevated although slowly improving it appears-still has frequent readings in the 200s occasionally 300s- will increase her Basaglar insulin up to 37 units and monitor  #4- hypertension this appears stable on Lopressor 12.5 mg twice daily as well as digoxin she is also on Lasix again Lasix dose has been slightly reduced her blood pressures do appear to be stable however.  5.-  History of diastolic CHF she has lost about 12 pounds over the past month-this is been largely fluid related I suspect at this point continue the metolazone twice daily twice a week will reduce her Lasix temporarily because of mildly elevated creatinine-will make further adjustments based on labs later this week.  6.  History of A. fib this appears rate controlled on Lopressor she is on digoxin as well.--Continues on Eliquis 5 mg twice daily for anticoagulation  7.  History of GI bleed statu She is already over there thank you thank you s post colonoscopy and polypectomy-hemoglobin has shown stability on most recent lab on August 19--it was 11.8  [id number]

## 2017-12-28 ENCOUNTER — Encounter (HOSPITAL_COMMUNITY)
Admission: RE | Admit: 2017-12-28 | Discharge: 2017-12-28 | Disposition: A | Discharge status: Home / Self Care | Payer: Medicare Other | Source: Skilled Nursing Facility | Attending: Internal Medicine | Admitting: Internal Medicine

## 2017-12-28 DIAGNOSIS — I5042 Chronic combined systolic (congestive) and diastolic (congestive) heart failure: Secondary | ICD-10-CM | POA: Diagnosis not present

## 2017-12-28 DIAGNOSIS — E1140 Type 2 diabetes mellitus with diabetic neuropathy, unspecified: Secondary | ICD-10-CM | POA: Diagnosis not present

## 2017-12-28 DIAGNOSIS — E876 Hypokalemia: Secondary | ICD-10-CM | POA: Diagnosis not present

## 2017-12-28 DIAGNOSIS — F329 Major depressive disorder, single episode, unspecified: Secondary | ICD-10-CM | POA: Insufficient documentation

## 2017-12-28 DIAGNOSIS — M6281 Muscle weakness (generalized): Secondary | ICD-10-CM | POA: Diagnosis not present

## 2017-12-28 LAB — BASIC METABOLIC PANEL
Anion gap: 9 (ref 5–15)
BUN: 21 mg/dL (ref 8–23)
CALCIUM: 9 mg/dL (ref 8.9–10.3)
CO2: 31 mmol/L (ref 22–32)
CREATININE: 1.07 mg/dL — AB (ref 0.44–1.00)
Chloride: 100 mmol/L (ref 98–111)
GFR calc non Af Amer: 49 mL/min — ABNORMAL LOW (ref >60)
GFR, EST AFRICAN AMERICAN: 57 mL/min — AB (ref >60)
GLUCOSE: 226 mg/dL — AB (ref 70–99)
Potassium: 3.7 mmol/L (ref 3.5–5.1)
Sodium: 140 mmol/L (ref 135–145)

## 2017-12-30 ENCOUNTER — Encounter (HOSPITAL_COMMUNITY)
Admission: RE | Admit: 2017-12-30 | Discharge: 2017-12-30 | Disposition: A | Discharge status: Home / Self Care | Payer: Medicare Other | Source: Skilled Nursing Facility

## 2017-12-30 DIAGNOSIS — R262 Difficulty in walking, not elsewhere classified: Secondary | ICD-10-CM | POA: Diagnosis not present

## 2017-12-30 DIAGNOSIS — E1140 Type 2 diabetes mellitus with diabetic neuropathy, unspecified: Secondary | ICD-10-CM | POA: Diagnosis not present

## 2017-12-30 DIAGNOSIS — M6281 Muscle weakness (generalized): Secondary | ICD-10-CM | POA: Diagnosis not present

## 2017-12-30 DIAGNOSIS — I5042 Chronic combined systolic (congestive) and diastolic (congestive) heart failure: Secondary | ICD-10-CM | POA: Diagnosis not present

## 2017-12-30 LAB — BASIC METABOLIC PANEL
ANION GAP: 8 (ref 5–15)
BUN: 18 mg/dL (ref 8–23)
CO2: 30 mmol/L (ref 22–32)
Calcium: 8.9 mg/dL (ref 8.9–10.3)
Chloride: 98 mmol/L (ref 98–111)
Creatinine, Ser: 0.99 mg/dL (ref 0.44–1.00)
GFR calc Af Amer: >60 mL/min (ref >60)
GFR calc non Af Amer: 54 mL/min — ABNORMAL LOW (ref >60)
Glucose, Bld: 205 mg/dL — ABNORMAL HIGH (ref 70–99)
POTASSIUM: 4.1 mmol/L (ref 3.5–5.1)
Sodium: 136 mmol/L (ref 135–145)

## 2018-01-02 ENCOUNTER — Encounter (HOSPITAL_COMMUNITY)
Admission: RE | Admit: 2018-01-02 | Discharge: 2018-01-02 | Disposition: A | Discharge status: Home / Self Care | Payer: Medicare Other | Source: Skilled Nursing Facility | Attending: Internal Medicine | Admitting: Internal Medicine

## 2018-01-02 DIAGNOSIS — I5042 Chronic combined systolic (congestive) and diastolic (congestive) heart failure: Secondary | ICD-10-CM | POA: Insufficient documentation

## 2018-01-02 DIAGNOSIS — E876 Hypokalemia: Secondary | ICD-10-CM | POA: Diagnosis not present

## 2018-01-02 DIAGNOSIS — E1140 Type 2 diabetes mellitus with diabetic neuropathy, unspecified: Secondary | ICD-10-CM | POA: Diagnosis not present

## 2018-01-02 LAB — BASIC METABOLIC PANEL
Anion gap: 13 (ref 5–15)
BUN: 25 mg/dL — ABNORMAL HIGH (ref 8–23)
CO2: 28 mmol/L (ref 22–32)
Calcium: 9.3 mg/dL (ref 8.9–10.3)
Chloride: 98 mmol/L (ref 98–111)
Creatinine, Ser: 1.25 mg/dL — ABNORMAL HIGH (ref 0.44–1.00)
GFR calc non Af Amer: 40 mL/min — ABNORMAL LOW (ref >60)
GFR, EST AFRICAN AMERICAN: 47 mL/min — AB (ref >60)
Glucose, Bld: 178 mg/dL — ABNORMAL HIGH (ref 70–99)
Potassium: 3.5 mmol/L (ref 3.5–5.1)
Sodium: 139 mmol/L (ref 135–145)

## 2018-01-09 ENCOUNTER — Non-Acute Institutional Stay (SKILLED_NURSING_FACILITY): Payer: Medicare Other | Admitting: Internal Medicine

## 2018-01-09 ENCOUNTER — Encounter: Payer: Self-pay | Admitting: Internal Medicine

## 2018-01-09 DIAGNOSIS — E1140 Type 2 diabetes mellitus with diabetic neuropathy, unspecified: Secondary | ICD-10-CM | POA: Diagnosis not present

## 2018-01-09 DIAGNOSIS — R600 Localized edema: Secondary | ICD-10-CM | POA: Diagnosis not present

## 2018-01-09 DIAGNOSIS — Z794 Long term (current) use of insulin: Secondary | ICD-10-CM | POA: Diagnosis not present

## 2018-01-09 DIAGNOSIS — I872 Venous insufficiency (chronic) (peripheral): Secondary | ICD-10-CM

## 2018-01-09 NOTE — Progress Notes (Signed)
Location:    [place] Room Number: 113/W Place of Service:  SNF [phone]) Provider: Veleta Miners MD  Celedonio Savage, MD  Patient Care Team: Celedonio Savage, MD as PCP - General (Family Medicine) Rothbart, Cristopher Estimable, MD (Cardiology) Lendon Colonel, NP as Nurse Practitioner (Nurse Practitioner)  Extended Emergency Contact Information Primary Emergency Contact: [facility] Address: [address]          [place], [place] [address] [name] of [place] Phone: [phone] Mobile Phone: [phone] Relation: Daughter  Code Status:  DNR Goals of care: Advanced Directive information Advanced Directives 01/09/2018  Does Patient Have a Medical Advance Directive? Yes  Type of Advance Directive Out of facility DNR (pink MOST or yellow form)  Does patient want to make changes to medical advance directive? No - Patient declined  Copy of [place] in Chart? No - copy requested  Pre-existing out of facility DNR order (yellow form or pink MOST form) -     Chief Complaint  Patient presents with  . Acute Visit    F/U B/S    HPI:  Pt is a 77 y.o. female seen today for an acute visit for Elevated BS  Patient has h/o Diabetes mellitus type 2 , atrial fibrillation on chronic anticoagulation, Venous stasis, Dementia with Behavior problems, S/P fall leading to Ocular floor fracture.Recurrent Cellulitis oFRightLE.and recent GI bleed s/p Colonoscopy with Polypectomy  Patient is unable to give me any history due to her dementia.  Her blood sugar are running more than 300 in the afternoon and at 5:00.  Her blood sugars in the morning are between 1 130-180.  He does not have any fever or chills.  Right leg swelling is stable no more redness.  Her appetite is good  Past Medical History:  Diagnosis Date  . Allergy   . Atrial fibrillation ([place]) 08/2011   First diagnosed in 08/2011; duration of arrhythmia is uncertain  . Bilateral lower extremity edema    . Chronic diarrhea    diverticulosis  . COPD (chronic obstructive pulmonary disease) ([place])   . Dementia   . Diabetes mellitus, type 2 ([facility])    Diabetic neuropathy  . Dyspnea on exertion    pedal edema  . Gout   . Headache(784.0)    twice weekly  . Hyperlipidemia   . Hypertension   . Osteopenia    DEXA scan 01/2010  . Palpitations   . Seasonal allergies   . Stress incontinence   . Vertigo    Past Surgical History:  Procedure Laterality Date  . APPENDECTOMY    . BREAST BIOPSY  2002   Right  . CESAREAN SECTION     x 2  . CHOLECYSTECTOMY    . COLONOSCOPY  2007   Negative screening study  . COLONOSCOPY WITH PROPOFOL N/A 09/18/2017   Procedure: COLONOSCOPY WITH PROPOFOL;  Surgeon: Daneil Dolin, MD;  [facility];  Service: Endoscopy;  Laterality: N/A;  11:00am  . HAMMER TOE SURGERY     Bilateral hammer toe amputation  . INCISIONAL HERNIA REPAIR    . KNEE ARTHROSCOPY W/ MENISCAL REPAIR  2007   Bilateral  . POLYPECTOMY  09/18/2017   Procedure: POLYPECTOMY;  Surgeon: Daneil Dolin, MD;  [facility];  Service: Endoscopy;;  colon  . UMBILICAL HERNIA REPAIR      Allergies  Allergen Reactions  . Codeine Anaphylaxis and Hives  . Morphine And Related Anaphylaxis and Hives  . Penicillins  Anaphylaxis  . Ace Inhibitors Cough    Outpatient Encounter Medications as of 01/09/2018  Medication Sig  . acetaminophen (TYLENOL) 325 MG tablet Take 650 mg by mouth every 6 (six) hours as needed.   Marland Kitchen alendronate (FOSAMAX) 70 MG tablet Give 1 tablet by mouth on Sunday. Take with a full glass of water on an empty stomach.  Marland Kitchen apixaban (ELIQUIS) 5 MG TABS tablet Take 5 mg by mouth 2 (two) times daily.   . carboxymethylcellulose (REFRESH PLUS) 0.5 % SOLN Place 1 drop into both eyes 4 (four) times daily.  . cholecalciferol (VITAMIN D) 1000 UNITS tablet Take 1,000 Units by mouth daily.  . citalopram (CELEXA) 20 MG tablet Take 1 tablet (20 mg total) by mouth daily.  .  digoxin (LANOXIN) 0.125 MG tablet Take 1 tablet by mouth once a day (HOLD FOR AP UNDER 60)  . furosemide (LASIX) 20 MG tablet Take 40 mg by mouth daily.   . insulin aspart (NOVOLOG FLEXPEN) 100 UNIT/ML FlexPen Inject 10 Units into the skin 2 (two) times daily.  . Insulin Glargine (BASAGLAR KWIKPEN) 100 UNIT/ML SOPN Inject 40 Units into the skin at bedtime.   . Insulin Pen Needle (EASY TOUCH PEN NEEDLES) 31G X 8 MM MISC Use twice a day  . memantine (NAMENDA) 10 MG tablet Take 10 mg by mouth 2 (two) times daily.  . metolazone (ZAROXOLYN) 2.5 MG tablet Take 2.5 mg by mouth 2 (two) times daily. Mon. Fri  . metoprolol tartrate (LOPRESSOR) 25 MG tablet Take 75 mg by mouth 2 (two) times daily.  . Multiple Vitamin (MULTIVITAMIN WITH MINERALS) TABS Take 1 tablet by mouth every morning.  Marland Kitchen omeprazole (PRILOSEC) 40 MG capsule Take 40 mg by mouth daily.  . polyethylene glycol (MIRALAX / GLYCOLAX) packet Take 17 g by mouth daily.  . potassium chloride (MICRO-K) 10 MEQ CR capsule Take 20 mEq by mouth daily. Potassium chloride extended release capsule, give 20 meq by mouth once an evening  . potassium chloride SA (K-DUR,KLOR-CON) 20 MEQ tablet Take 40 mEq by mouth daily.   . pravastatin (PRAVACHOL) 20 MG tablet Take 20 mg by mouth daily. Take along with 40 mg to = 60 mg  . pravastatin (PRAVACHOL) 40 MG tablet Take 40 mg by mouth daily. Take along with 20 mg to = 60 mg  . sitaGLIPtin-metformin (JANUMET) 50-500 MG tablet Take 1 tablet by mouth 2 (two) times daily with a meal.   No facility-administered encounter medications on file as of 01/09/2018.      Review of Systems  Unable to perform ROS: Dementia    Immunization History  Administered Date(s) Administered  . Influenza-Unspecified 02/04/2016, 02/03/2017  . Pneumococcal Conjugate-13 02/03/2017  . Pneumococcal-Unspecified 02/08/2016  . Tdap 01/19/2017   Pertinent  Health Maintenance Due  Topic Date Due  . INFLUENZA VACCINE  02/08/2018 (Originally  11/30/2017)  . FOOT EXAM  01/25/2018  . OPHTHALMOLOGY EXAM  02/22/2018  . HEMOGLOBIN A1C  04/13/2018  . URINE MICROALBUMIN  08/23/2018  . DEXA SCAN  Completed  . PNA vac Low Risk Adult  Completed   Fall Risk  01/19/2017 11/06/2015 09/29/2015 08/31/2015 06/30/2015  Falls in the past year? No Yes No No No  Number falls in past yr: - 2 or more - - -  Injury with Fall? - No - - -   Functional Status Survey:    Vitals:   01/09/18 1419  BP: 105/67  Pulse: 76  Resp: 19  Temp: 97.8 F (36.6 C)  TempSrc: Oral  SpO2: 96%   There is no height or weight on file to calculate BMI. Physical Exam  Constitutional: She appears well-developed and well-nourished.  HENT:  Head: Normocephalic.  Eyes: Pupils are equal, round, and reactive to light.  Neck: Neck supple.  Cardiovascular: Normal rate. An irregular rhythm present.  No murmur heard. Pulmonary/Chest: Effort normal and breath sounds normal. No respiratory distress. She has no wheezes.  Abdominal: Soft. She exhibits no distension. There is no tenderness. There is no guarding.  Musculoskeletal:  Bilateral Edema Right More then Left.   Neurological: She is alert.  Skin: Skin is warm and dry.  Psychiatric: She has a normal mood and affect. Her behavior is normal. Thought content normal.    Labs reviewed: Recent Labs    12/28/17 0750 12/30/17 0415 01/02/18 0700  NA 140 136 139  K 3.7 4.1 3.5  CL 100 98 98  CO2 31 30 28   GLUCOSE 226* 205* 178*  BUN 21 18 25*  CREATININE 1.07* 0.99 1.25*  CALCIUM 9.0 8.9 9.3   Recent Labs    08/08/17 1459 11/08/17 1625  AST 21 25  ALT 15 19  ALKPHOS 54 52  BILITOT 0.5 0.5  PROT 7.5 7.9  ALBUMIN 3.3* 3.3*   Recent Labs    10/19/17 0729 11/08/17 1625 11/27/17 0700 12/18/17 0700  WBC 9.1 9.8 9.7 9.1  NEUTROABS 5.2 5.6 5.3  --   HGB 11.6* 11.9* 12.3 11.8*  HCT 38.3 38.2 39.6 38.1  MCV 90.8 90.5 89.0 89.6  PLT 224 248 233 217   Lab Results  Component Value Date   TSH 3.269  11/08/2017   Lab Results  Component Value Date   HGBA1C 7.4 (H) 10/12/2017   Lab Results  Component Value Date   CHOL 149 12/18/2017   HDL 31 (L) 12/18/2017   LDLCALC 87 12/18/2017   TRIG 157 (H) 12/18/2017   CHOLHDL 4.8 12/18/2017    Significant Diagnostic Results in last 30 days:  No results found.  Assessment/Plan  Type 2 Diabetes With Neuropathy Will Increase the Lantus to 44 Units Increase Bolus to 12 units with Meals BID Repeat A1C Also Continue Janumet BID  LE edema with Recurrent Cellulitis Patient stable now on Lasix and Metolazone twice  Week. Her weight is 200 lbs and stable. She has not had Cellulitis recently Renal Function stable.  Family/ staff Communication:   Labs/tests ordered:

## 2018-01-12 ENCOUNTER — Encounter (HOSPITAL_COMMUNITY)
Admission: RE | Admit: 2018-01-12 | Discharge: 2018-01-12 | Disposition: A | Discharge status: Home / Self Care | Payer: Medicare Other | Source: Skilled Nursing Facility | Attending: Internal Medicine | Admitting: Internal Medicine

## 2018-01-12 DIAGNOSIS — E1140 Type 2 diabetes mellitus with diabetic neuropathy, unspecified: Secondary | ICD-10-CM | POA: Diagnosis not present

## 2018-01-12 DIAGNOSIS — E876 Hypokalemia: Secondary | ICD-10-CM | POA: Insufficient documentation

## 2018-01-12 DIAGNOSIS — I5042 Chronic combined systolic (congestive) and diastolic (congestive) heart failure: Secondary | ICD-10-CM | POA: Insufficient documentation

## 2018-01-12 LAB — DIGOXIN LEVEL: Digoxin Level: 0.9 ng/mL (ref 0.8–2.0)

## 2018-01-22 DIAGNOSIS — B351 Tinea unguium: Secondary | ICD-10-CM | POA: Diagnosis not present

## 2018-01-22 DIAGNOSIS — I739 Peripheral vascular disease, unspecified: Secondary | ICD-10-CM | POA: Diagnosis not present

## 2018-01-22 DIAGNOSIS — Z89422 Acquired absence of other left toe(s): Secondary | ICD-10-CM | POA: Diagnosis not present

## 2018-01-23 ENCOUNTER — Encounter (HOSPITAL_COMMUNITY)
Admission: RE | Admit: 2018-01-23 | Discharge: 2018-01-23 | Disposition: A | Discharge status: Home / Self Care | Payer: Medicare Other | Source: Skilled Nursing Facility | Attending: Internal Medicine | Admitting: Internal Medicine

## 2018-01-23 ENCOUNTER — Encounter: Payer: Self-pay | Admitting: Internal Medicine

## 2018-01-23 ENCOUNTER — Non-Acute Institutional Stay (SKILLED_NURSING_FACILITY): Payer: Medicare Other | Admitting: Internal Medicine

## 2018-01-23 DIAGNOSIS — F0391 Unspecified dementia with behavioral disturbance: Secondary | ICD-10-CM | POA: Diagnosis not present

## 2018-01-23 DIAGNOSIS — E1140 Type 2 diabetes mellitus with diabetic neuropathy, unspecified: Secondary | ICD-10-CM

## 2018-01-23 DIAGNOSIS — E785 Hyperlipidemia, unspecified: Secondary | ICD-10-CM

## 2018-01-23 DIAGNOSIS — I482 Chronic atrial fibrillation, unspecified: Secondary | ICD-10-CM

## 2018-01-23 DIAGNOSIS — F329 Major depressive disorder, single episode, unspecified: Secondary | ICD-10-CM | POA: Diagnosis not present

## 2018-01-23 DIAGNOSIS — I10 Essential (primary) hypertension: Secondary | ICD-10-CM | POA: Diagnosis not present

## 2018-01-23 DIAGNOSIS — E876 Hypokalemia: Secondary | ICD-10-CM | POA: Diagnosis not present

## 2018-01-23 DIAGNOSIS — Z794 Long term (current) use of insulin: Secondary | ICD-10-CM

## 2018-01-23 DIAGNOSIS — M6281 Muscle weakness (generalized): Secondary | ICD-10-CM | POA: Insufficient documentation

## 2018-01-23 DIAGNOSIS — D649 Anemia, unspecified: Secondary | ICD-10-CM | POA: Diagnosis not present

## 2018-01-23 DIAGNOSIS — I5042 Chronic combined systolic (congestive) and diastolic (congestive) heart failure: Secondary | ICD-10-CM | POA: Insufficient documentation

## 2018-01-23 LAB — HEMOGLOBIN A1C
Hgb A1c MFr Bld: 8.7 % — ABNORMAL HIGH (ref 4.8–5.6)
Mean Plasma Glucose: 202.99 mg/dL

## 2018-01-23 LAB — BASIC METABOLIC PANEL
ANION GAP: 9 (ref 5–15)
BUN: 21 mg/dL (ref 8–23)
CO2: 29 mmol/L (ref 22–32)
Calcium: 8.8 mg/dL — ABNORMAL LOW (ref 8.9–10.3)
Chloride: 102 mmol/L (ref 98–111)
Creatinine, Ser: 1.09 mg/dL — ABNORMAL HIGH (ref 0.44–1.00)
GFR calc Af Amer: 55 mL/min — ABNORMAL LOW (ref >60)
GFR, EST NON AFRICAN AMERICAN: 48 mL/min — AB (ref >60)
GLUCOSE: 194 mg/dL — AB (ref 70–99)
Potassium: 3.8 mmol/L (ref 3.5–5.1)
Sodium: 140 mmol/L (ref 135–145)

## 2018-01-23 NOTE — Progress Notes (Signed)
Location:    [facility] Room Number: 113/W Place of Service:  SNF [phone]) Provider: Lajean Silvius, MD  Patient Care Team: Celedonio Savage, MD as PCP - General (Family Medicine) Rothbart, Cristopher Estimable, MD (Cardiology) Lendon Colonel, NP as Nurse Practitioner (Nurse Practitioner)  Extended Emergency Contact Information Primary Emergency Contact: [facility] Address: [address]          [place], [place] [address] [name] of [place] Phone: [phone] Mobile Phone: [phone] Relation: Daughter  Code Status:  DNR Goals of care: Advanced Directive information Advanced Directives 01/23/2018  Does Patient Have a Medical Advance Directive? Yes  Type of Advance Directive Out of facility DNR (pink MOST or yellow form)  Does patient want to make changes to medical advance directive? No - Patient declined  Copy of [place] in Chart? No - copy requested  Pre-existing out of facility DNR order (yellow form or pink MOST form) -     Chief Complaint  Patient presents with  . Medical Management of Chronic Issues    Patient is being seen for routine visit of medical management  Medical management of numerous medical issues including atrial fibrillation-type 2 diabetes-history of diastolic CHF-venous stasis edema-dementia with behaviors- history of right lower extremity cellulitis-history of GI bleed-as well as osteoporosis and hypertension.    HPI:  Pt is a 77 y.o. female seen today for medical management of chronic diseases.  As noted above.  She appears to be stable at this time- she has a history of elevated blood sugars especially later in the day adjustments were made including her Basaglar long-acting insulin was increased up to 44 units recently her NovoLog at lunch and dinner also was increased to 12 units- this appears to have helped- last couple days morning sugars have run largely in the lower mid 100s which is  baseline.  Later in the day blood sugars have been more in the higher 100s occasionally 200s but I do not see frequent 300 readings which at one point was the case.  - At this point will continue to monitor.--Globin A1c is pending- hemoglobin A1c in June was 7.4  Of note she is also on Januvia 50-500 twice daily  She also has a history of recurrent right lower extremity cellulitis but this appears to have resolved for now she also had some increased edema and adjustments were made in her diuretic   she is now on Lasix 40 mg a day and metolazone twice a week this appears to have helped her weight is relatively stable at around 200 pounds  She also has a history of GI bleed she is status post colonoscopy with polypectomy-she is on Prilosec hemoglobin has shown stability since June largely in the higher 11's-low 12 range.  In regards to dementia with behaviors this is been stable she is on Namenda-she is also on Celexa for coexistent depression.  Regards to hypertension this appears stable on Lasix Lopressor and metolazone blood pressure today was 131/71 previous ones 125/77 and 120/84  Currently she is sitting in a wheelchair comfortably continues to be pleasantly confused vital signs  appear to be stable   Past Medical History:  Diagnosis Date  . Allergy   . Atrial fibrillation ([place]) 08/2011   First diagnosed in 08/2011; duration of arrhythmia is uncertain  . Bilateral lower extremity edema   . Chronic diarrhea    diverticulosis  . COPD (chronic obstructive pulmonary disease) ([place])   .  Dementia   . Diabetes mellitus, type 2 (HCC)    Diabetic neuropathy  . Dyspnea on exertion    pedal edema  . Gout   . Headache(784.0)    twice weekly  . Hyperlipidemia   . Hypertension   . Osteopenia    DEXA scan 01/2010  . Palpitations   . Seasonal allergies   . Stress incontinence   . Vertigo    Past Surgical History:  Procedure Laterality Date  . APPENDECTOMY    . BREAST BIOPSY  2002     Right  . CESAREAN SECTION     x 2  . CHOLECYSTECTOMY    . COLONOSCOPY  2007   Negative screening study  . COLONOSCOPY WITH PROPOFOL N/A 09/18/2017   Procedure: COLONOSCOPY WITH PROPOFOL;  Surgeon: Daneil Dolin, MD;  [facility];  Service: Endoscopy;  Laterality: N/A;  11:00am  . HAMMER TOE SURGERY     Bilateral hammer toe amputation  . INCISIONAL HERNIA REPAIR    . KNEE ARTHROSCOPY W/ MENISCAL REPAIR  2007   Bilateral  . POLYPECTOMY  09/18/2017   Procedure: POLYPECTOMY;  Surgeon: Daneil Dolin, MD;  [facility];  Service: Endoscopy;;  colon  . UMBILICAL HERNIA REPAIR      Allergies  Allergen Reactions  . Codeine Anaphylaxis and Hives  . Morphine And Related Anaphylaxis and Hives  . Penicillins Anaphylaxis  . Ace Inhibitors Cough    Outpatient Encounter Medications as of 01/23/2018  Medication Sig  . acetaminophen (TYLENOL) 325 MG tablet Take 650 mg by mouth every 6 (six) hours as needed.   Marland Kitchen alendronate (FOSAMAX) 70 MG tablet Give 1 tablet by mouth on Sunday. Take with a full glass of water on an empty stomach.  Marland Kitchen apixaban (ELIQUIS) 5 MG TABS tablet Take 5 mg by mouth 2 (two) times daily.   . carboxymethylcellulose (REFRESH PLUS) 0.5 % SOLN Place 1 drop into both eyes 4 (four) times daily.  . cholecalciferol (VITAMIN D) 1000 UNITS tablet Take 1,000 Units by mouth daily.  . citalopram (CELEXA) 20 MG tablet Take 1 tablet (20 mg total) by mouth daily.  . digoxin (LANOXIN) 0.125 MG tablet Take 1 tablet by mouth once a day (HOLD FOR AP UNDER 60)  . furosemide (LASIX) 20 MG tablet Take 40 mg by mouth daily.   . insulin aspart (NOVOLOG FLEXPEN) 100 UNIT/ML FlexPen Inject 12 Units into the skin 2 (two) times daily.   . Insulin Glargine (BASAGLAR KWIKPEN) 100 UNIT/ML SOPN Inject 44 Units into the skin at bedtime.   . Insulin Pen Needle (EASY TOUCH PEN NEEDLES) 31G X 8 MM MISC Use twice a day  . memantine (NAMENDA) 10 MG tablet Take 10 mg by mouth 2 (two)  times daily.  . metolazone (ZAROXOLYN) 2.5 MG tablet Take 2.5 mg by mouth 2 (two) times daily. Mon. Fri  . metoprolol tartrate (LOPRESSOR) 25 MG tablet Take 75 mg by mouth 2 (two) times daily.  . Multiple Vitamin (MULTIVITAMIN WITH MINERALS) TABS Take 1 tablet by mouth every morning.  Marland Kitchen omeprazole (PRILOSEC) 40 MG capsule Take 40 mg by mouth daily.  . polyethylene glycol (MIRALAX / GLYCOLAX) packet Take 17 g by mouth daily.  . potassium chloride (MICRO-K) 10 MEQ CR capsule Take 20 mEq by mouth daily. Potassium chloride extended release capsule, give 20 meq by mouth once an evening  . potassium chloride SA (K-DUR,KLOR-CON) 20 MEQ tablet Take 40 mEq by mouth daily.   . pravastatin (  PRAVACHOL) 20 MG tablet Take 20 mg by mouth daily. Take along with 40 mg to = 60 mg  . pravastatin (PRAVACHOL) 40 MG tablet Take 40 mg by mouth daily. Take along with 20 mg to = 60 mg  . sitaGLIPtin-metformin (JANUMET) 50-500 MG tablet Take 1 tablet by mouth 2 (two) times daily with a meal.   No facility-administered encounter medications on file as of 01/23/2018.      Review of Systems   This is limited secondary to dementia  In general no complaints of fever chills her weight is relatively stable.  Skin complain of rashes or itching.  Head ears eyes nose mouth and throat no complaints of visual changes sore throat.  Respiratory is not complaining shortness of breath or cough.  Cardiac is not complaining of any chest pain or edema appears to be baseline stable.  GI is not complaining of abdominal pain nausea vomiting diarrhea or constipation.  GU is not complaining of dysuria.  Musculoskeletal is not complaining of joint pain this afternoon.  Neurologic does not complain of being dizzy or having headaches or having numbness.  Ipsych continues to be pleasantly confused does not appear overtly depressed appears to be in good spirits does have a history of dementia but appears to do well with supportive  care  Immunization History  Administered Date(s) Administered  . Influenza-Unspecified 02/04/2016, 02/03/2017  . Pneumococcal Conjugate-13 02/03/2017  . Pneumococcal-Unspecified 02/08/2016  . Tdap 01/19/2017   Pertinent  Health Maintenance Due  Topic Date Due  . INFLUENZA VACCINE  02/08/2018 (Originally 11/30/2017)  . FOOT EXAM  01/25/2018  . OPHTHALMOLOGY EXAM  02/22/2018  . HEMOGLOBIN A1C  04/13/2018  . URINE MICROALBUMIN  08/23/2018  . DEXA SCAN  Completed  . PNA vac Low Risk Adult  Completed   Fall Risk  01/19/2017 11/06/2015 09/29/2015 08/31/2015 06/30/2015  Falls in the past year? No Yes No No No  Number falls in past yr: - 2 or more - - -  Injury with Fall? - No - - -   Functional Status Survey:    Vitals:   01/23/18 1257  BP: 131/71  Pulse: 78  Resp: 20  Temp: (!) 97.4 F (36.3 C)  TempSrc: Oral  SpO2: 97%  Weight: 202 lb 12.8 oz (92 kg)  Height: 5\' 6"  (1.676 m)   Body mass index is 32.73 kg/m. Physical Exam  In general this is a pleasant elderly female no distress sitting comfortably in her wheelchair.  Her skin is warm and dry.  Eyes visual acuity appears grossly intact sclera and conjunctive are clear.  Pharynx is clear mucous membranes moist.  Chest is clear to auscultation there is no labored breathing.  Heart is regular irregular rate and rhythm without murmur gallop or rub she has baseline lower extremity edema bilaterally possibly a bit more on the right versus left  Abdomen is soft nontender with positive bowel sounds.  Musculoskeletal- moves all extremities at baseline with some baseline lower extremity weakness.  Continues to have edema on lower extremities at baseline- I do not see any concerning erythema on either leg she does have some pale erythematous venous stasis changes but I do not see evidence of cellulitis.  Neurologic is grossly intact her speech is clear no lateralizing findings she is alert.  Psych she is oriented to self she is  pleasantly confused cooperative with exam and follow simple verbal commands   Labs reviewed: Recent Labs    12/30/17 0415 01/02/18 0700 01/23/18  0733  NA 136 139 140  K 4.1 3.5 3.8  CL 98 98 102  CO2 30 28 29   GLUCOSE 205* 178* 194*  BUN 18 25* 21  CREATININE 0.99 1.25* 1.09*  CALCIUM 8.9 9.3 8.8*   Recent Labs    08/08/17 1459 11/08/17 1625  AST 21 25  ALT 15 19  ALKPHOS 54 52  BILITOT 0.5 0.5  PROT 7.5 7.9  ALBUMIN 3.3* 3.3*   Recent Labs    10/19/17 0729 11/08/17 1625 11/27/17 0700 12/18/17 0700  WBC 9.1 9.8 9.7 9.1  NEUTROABS 5.2 5.6 5.3  --   HGB 11.6* 11.9* 12.3 11.8*  HCT 38.3 38.2 39.6 38.1  MCV 90.8 90.5 89.0 89.6  PLT 224 248 233 217   Lab Results  Component Value Date   TSH 3.269 11/08/2017   Lab Results  Component Value Date   HGBA1C 7.4 (H) 10/12/2017   Lab Results  Component Value Date   CHOL 149 12/18/2017   HDL 31 (L) 12/18/2017   LDLCALC 87 12/18/2017   TRIG 157 (H) 12/18/2017   CHOLHDL 4.8 12/18/2017    Significant Diagnostic Results in last 30 days:  No results found.  Assessment/Plan  #1 diabetes type 2-as noted above blood sugars later in the day appears to be improving  Update hemoglobin A1c is pending- she is on Basaglar 44 units daily as well as NovoLog bolus 12 units at lunch and dinner as well as Janumet 50-500 twice daily  #2-atrial fibrillation this appears rate controlled she is on Lopressor-continues on Eliquis for anticoagulation of note she is also on digoxin level was 0.9 on lab done earlier this month.  3.-  History of diastolic CHF-this appears stable on current dose of Lasix she is also on metolazone twice a week- metabolic panel today shows stability creatinine actually mildly improved at 1.09.  Of note she is also on potassium supplementation.  Her weights have been fairly stable   4.-  History of dementia with behaviors this appears relatively stable she is on Namenda continues on Celexa for coexistent  depression appears to be in good spirits today- at times apparently she will have some slightly increased agitation and confusion but this does not appear to be the case today.  5.  History of right lower extremity cellulitis this appears stable at this point I do not see evidence of cellulitis-at this point continue to monitor.  6.  History of GI bleed status post colonoscopy and polypectomy she is on a proton pump inhibitor--hemoglobin's have shown stability over the last several months hovering in the high 11's to low 12s will monitor periodically.  7.-  History of hyperlipidemia this appears stable on pravastatin LDL was 87 on lab done last month.  8.  Hypertension this appears stable as well on Lasix Lopressor metolazone blood pressure today 131/71 this appears relatively baseline.  9.  History of osteoporosis she continues on Fosamax appears to be tolerating this well.  10 history of vitamin D deficiency she is on supplementation--level was 33.2 on lab done last September.  CPT- 99310-of note greater than 35 minutes spent assessing patient-reviewing her chart and labs- and coordinating and formulating a plan of care for numerous diagnoses- of note greater than 50% of time spent coordinating plan of care with input as noted above

## 2018-01-29 ENCOUNTER — Encounter: Payer: Self-pay | Admitting: Internal Medicine

## 2018-01-29 ENCOUNTER — Non-Acute Institutional Stay (SKILLED_NURSING_FACILITY): Payer: Medicare Other | Admitting: Internal Medicine

## 2018-01-29 DIAGNOSIS — Z794 Long term (current) use of insulin: Secondary | ICD-10-CM

## 2018-01-29 DIAGNOSIS — E1140 Type 2 diabetes mellitus with diabetic neuropathy, unspecified: Secondary | ICD-10-CM

## 2018-01-29 NOTE — Progress Notes (Signed)
Location:    [facility] Room Number: 113/W Place of Service:  SNF [phone]) Provider: Veleta Miners MD  Celedonio Savage, MD  Patient Care Team: Celedonio Savage, MD as PCP - General (Family Medicine) Rothbart, Cristopher Estimable, MD (Cardiology) Lendon Colonel, NP as Nurse Practitioner (Nurse Practitioner)  Extended Emergency Contact Information Primary Emergency Contact: [facility] Address: [address]          [place], [place] [address] [name] of [place] Phone: [phone] Mobile Phone: [phone] Relation: Daughter  Code Status:  DNR Goals of care: Advanced Directive information Advanced Directives 01/29/2018  Does Patient Have a Medical Advance Directive? Yes  Type of Advance Directive Out of facility DNR (pink MOST or yellow form)  Does patient want to make changes to medical advance directive? No - Patient declined  Copy of [place] in Chart? No - copy requested  Pre-existing out of facility DNR order (yellow form or pink MOST form) -     Chief Complaint  Patient presents with  . Acute Visit    Patient is being seen for elevated BS    HPI:  Pt is a 77 y.o. female seen today for Elevated BS  Patient has h/o Diabetes mellitus type 2 , Atrial fibrillation on chronic anticoagulation, Venous stasis, Dementia with Behavior problems, S/P fall leading to Ocular floor fracture.Recurrent Cellulitis oFRightLE.and h/o GI bleed s/p Colonoscopy with Polypectomy Patient Unable to Give any history  Due to her Dementia But per nurses her BE are again running high Sometimes more then 300 especially in Evening. More then 200 in the Morning. No recent Signs of Infection. Her Right Leg stays swollen and slight Red. But no cellulitis. Her Appetite is good.  Past Medical History:  Diagnosis Date  . Allergy   . Atrial fibrillation ([place]) 08/2011   First diagnosed in 08/2011; duration of arrhythmia is uncertain  . Bilateral lower extremity  edema   . Chronic diarrhea    diverticulosis  . COPD (chronic obstructive pulmonary disease) ([place])   . Dementia   . Diabetes mellitus, type 2 (HCC)    Diabetic neuropathy  . Dyspnea on exertion    pedal edema  . Gout   . Headache(784.0)    twice weekly  . Hyperlipidemia   . Hypertension   . Osteopenia    DEXA scan 01/2010  . Palpitations   . Seasonal allergies   . Stress incontinence   . Vertigo    Past Surgical History:  Procedure Laterality Date  . APPENDECTOMY    . BREAST BIOPSY  2002   Right  . CESAREAN SECTION     x 2  . CHOLECYSTECTOMY    . COLONOSCOPY  2007   Negative screening study  . COLONOSCOPY WITH PROPOFOL N/A 09/18/2017   Procedure: COLONOSCOPY WITH PROPOFOL;  Surgeon: Daneil Dolin, MD;  [facility];  Service: Endoscopy;  Laterality: N/A;  11:00am  . HAMMER TOE SURGERY     Bilateral hammer toe amputation  . INCISIONAL HERNIA REPAIR    . KNEE ARTHROSCOPY W/ MENISCAL REPAIR  2007   Bilateral  . POLYPECTOMY  09/18/2017   Procedure: POLYPECTOMY;  Surgeon: Daneil Dolin, MD;  [facility];  Service: Endoscopy;;  colon  . UMBILICAL HERNIA REPAIR      Allergies  Allergen Reactions  . Codeine Anaphylaxis and Hives  . Morphine And Related Anaphylaxis and Hives  . Penicillins Anaphylaxis  . Ace Inhibitors Cough  Outpatient Encounter Medications as of 01/29/2018  Medication Sig  . acetaminophen (TYLENOL) 325 MG tablet Take 650 mg by mouth every 6 (six) hours as needed.   Marland Kitchen alendronate (FOSAMAX) 70 MG tablet Give 1 tablet by mouth on Sunday. Take with a full glass of water on an empty stomach.  Marland Kitchen apixaban (ELIQUIS) 5 MG TABS tablet Take 5 mg by mouth 2 (two) times daily.   . carboxymethylcellulose (REFRESH PLUS) 0.5 % SOLN Place 1 drop into both eyes 4 (four) times daily.  . cholecalciferol (VITAMIN D) 1000 UNITS tablet Take 1,000 Units by mouth daily.  . citalopram (CELEXA) 20 MG tablet Take 1 tablet (20 mg total) by mouth daily.   . digoxin (LANOXIN) 0.125 MG tablet Take 1 tablet by mouth once a day (HOLD FOR AP UNDER 60)  . furosemide (LASIX) 20 MG tablet Take 40 mg by mouth daily.   . insulin aspart (NOVOLOG FLEXPEN) 100 UNIT/ML FlexPen Inject 12 Units into the skin 2 (two) times daily.   . Insulin Glargine (BASAGLAR KWIKPEN) 100 UNIT/ML SOPN Inject 44 Units into the skin at bedtime.   . Insulin Pen Needle (EASY TOUCH PEN NEEDLES) 31G X 8 MM MISC Use twice a day  . memantine (NAMENDA) 10 MG tablet Take 10 mg by mouth 2 (two) times daily.  . metolazone (ZAROXOLYN) 2.5 MG tablet Take 5 mg by mouth daily. Mon. Fri   . metoprolol tartrate (LOPRESSOR) 25 MG tablet Take 75 mg by mouth 2 (two) times daily.  . Multiple Vitamin (MULTIVITAMIN WITH MINERALS) TABS Take 1 tablet by mouth every morning.  Marland Kitchen omeprazole (PRILOSEC) 40 MG capsule Take 40 mg by mouth daily.  . polyethylene glycol (MIRALAX / GLYCOLAX) packet Take 17 g by mouth daily.  . potassium chloride (MICRO-K) 10 MEQ CR capsule Take 20 mEq by mouth daily. Potassium chloride extended release capsule, give 20 meq by mouth once an evening  . potassium chloride SA (K-DUR,KLOR-CON) 20 MEQ tablet Take 40 mEq by mouth daily.   . pravastatin (PRAVACHOL) 20 MG tablet Take 20 mg by mouth daily. Take along with 40 mg to = 60 mg  . pravastatin (PRAVACHOL) 40 MG tablet Take 40 mg by mouth daily. Take along with 20 mg to = 60 mg  . sitaGLIPtin-metformin (JANUMET) 50-500 MG tablet Take 1 tablet by mouth 2 (two) times daily with a meal.   No facility-administered encounter medications on file as of 01/29/2018.      Review of Systems  Unable to perform ROS: Dementia    Immunization History  Administered Date(s) Administered  . Influenza-Unspecified 02/04/2016, 02/03/2017  . Pneumococcal Conjugate-13 02/03/2017  . Pneumococcal-Unspecified 02/08/2016  . Tdap 01/19/2017   Pertinent  Health Maintenance Due  Topic Date Due  . INFLUENZA VACCINE  02/08/2018 (Originally 11/30/2017)   . OPHTHALMOLOGY EXAM  02/22/2018  . HEMOGLOBIN A1C  07/24/2018  . URINE MICROALBUMIN  08/23/2018  . FOOT EXAM  10/31/2018  . DEXA SCAN  Completed  . PNA vac Low Risk Adult  Completed   Fall Risk  01/19/2017 11/06/2015 09/29/2015 08/31/2015 06/30/2015  Falls in the past year? No Yes No No No  Number falls in past yr: - 2 or more - - -  Injury with Fall? - No - - -   Functional Status Survey:    Vitals:   01/29/18 1148  BP: (!) 110/54  Pulse: 72  Resp: 15  Temp: 98.3 F (36.8 C)  TempSrc: Oral  SpO2: 98%   There  is no height or weight on file to calculate BMI. Physical Exam  Constitutional: She appears well-developed and well-nourished.  HENT:  Head: Normocephalic.  Eyes: Pupils are equal, round, and reactive to light.  Neck: Neck supple.  Cardiovascular: Normal rate. An irregular rhythm present.  No murmur heard. Pulmonary/Chest: Effort normal and breath sounds normal. No respiratory distress. She has no wheezes.  Abdominal: Soft. She exhibits no distension. There is no tenderness. There is no guarding.  Musculoskeletal:  Bilateral Edema Right More then Left.With Some redness in the Posterior side of Right LE   Neurological: She is alert.  Skin: Skin is warm and dry.  Psychiatric: She has a normal mood and affect. Her behavior is normal. Thought content normal.    Labs reviewed: Recent Labs    12/30/17 0415 01/02/18 0700 01/23/18 0733  NA 136 139 140  K 4.1 3.5 3.8  CL 98 98 102  CO2 30 28 29   GLUCOSE 205* 178* 194*  BUN 18 25* 21  CREATININE 0.99 1.25* 1.09*  CALCIUM 8.9 9.3 8.8*   Recent Labs    08/08/17 1459 11/08/17 1625  AST 21 25  ALT 15 19  ALKPHOS 54 52  BILITOT 0.5 0.5  PROT 7.5 7.9  ALBUMIN 3.3* 3.3*   Recent Labs    10/19/17 0729 11/08/17 1625 11/27/17 0700 12/18/17 0700  WBC 9.1 9.8 9.7 9.1  NEUTROABS 5.2 5.6 5.3  --   HGB 11.6* 11.9* 12.3 11.8*  HCT 38.3 38.2 39.6 38.1  MCV 90.8 90.5 89.0 89.6  PLT 224 248 233 217   Lab Results   Component Value Date   TSH 3.269 11/08/2017   Lab Results  Component Value Date   HGBA1C 8.7 (H) 01/23/2018   Lab Results  Component Value Date   CHOL 149 12/18/2017   HDL 31 (L) 12/18/2017   LDLCALC 87 12/18/2017   TRIG 157 (H) 12/18/2017   CHOLHDL 4.8 12/18/2017    Significant Diagnostic Results in last 30 days:  No results found.  Assessment/Plan Diabetes mellitus Uncontrolled with Neuropathy Increase her Lantus to 48 units Continue on Bolus Insulin but will increase the dose to 14 units if her sugars are above 250. Dietary modifications Her A1C was also elevated at 8.7 Continue Janumet     Family/ staff Communication:   Labs/tests ordered:

## 2018-01-30 ENCOUNTER — Non-Acute Institutional Stay (SKILLED_NURSING_FACILITY): Payer: Medicare Other

## 2018-01-30 DIAGNOSIS — Z0000 Encounter for general adult medical examination without abnormal findings: Secondary | ICD-10-CM | POA: Diagnosis not present

## 2018-01-30 NOTE — Patient Instructions (Signed)
Christine [name] , Thank you for taking time to come for your Medicare Wellness Visit. I appreciate your ongoing commitment to your health goals. Please review the following plan we discussed and let me know if I can assist you in the future.   Screening recommendations/referrals: Colonoscopy excluded, over age 78 Mammogram excluded, over age 24 Bone Density up to date Recommended yearly ophthalmology/optometry visit for glaucoma screening and checkup Recommended yearly dental visit for hygiene and checkup  Vaccinations: Influenza vaccine due, will receive at [facility] Pneumococcal vaccine up to date, completed Tdap vaccine up to date, due 01/20/2027 Shingles vaccine not in past reocords    Advanced directives: in chart  Conditions/risks identified: none  Next appointment: Dr. Lyndel Safe makes rounds   Preventive Care 65 Years and Older, Female Preventive care refers to lifestyle choices and visits with your health care provider that can promote health and wellness. What does preventive care include?  A yearly physical exam. This is also called an annual well check.  Dental exams once or twice a year.  Routine eye exams. Ask your health care provider how often you should have your eyes checked.  Personal lifestyle choices, including:  Daily care of your teeth and gums.  Regular physical activity.  Eating a healthy diet.  Avoiding tobacco and drug use.  Limiting alcohol use.  Practicing safe sex.  Taking low-dose aspirin every day.  Taking vitamin and mineral supplements as recommended by your health care provider. What happens during an annual well check? The services and screenings done by your health care provider during your annual well check will depend on your age, overall health, lifestyle risk factors, and family history of disease. Counseling  Your health care provider may ask you questions about your:  Alcohol use.  Tobacco use.  Drug use.  Emotional  well-being.  Home and relationship well-being.  Sexual activity.  Eating habits.  History of falls.  Memory and ability to understand (cognition).  Work and work Statistician.  Reproductive health. Screening  You may have the following tests or measurements:  Height, weight, and BMI.  Blood pressure.  Lipid and cholesterol levels. These may be checked every 5 years, or more frequently if you are over 76 years old.  Skin check.  Lung cancer screening. You may have this screening every year starting at age 23 if you have a 30-pack-year history of smoking and currently smoke or have quit within the past 15 years.  Fecal occult blood test (FOBT) of the stool. You may have this test every year starting at age 70.  Flexible sigmoidoscopy or colonoscopy. You may have a sigmoidoscopy every 5 years or a colonoscopy every 10 years starting at age 71.  Hepatitis C blood test.  Hepatitis B blood test.  Sexually transmitted disease (STD) testing.  Diabetes screening. This is done by checking your blood sugar (glucose) after you have not eaten for a while (fasting). You may have this done every 1-3 years.  Bone density scan. This is done to screen for osteoporosis. You may have this done starting at age 61.  Mammogram. This may be done every 1-2 years. Talk to your health care provider about how often you should have regular mammograms. Talk with your health care provider about your test results, treatment options, and if necessary, the need for more tests. Vaccines  Your health care provider may recommend certain vaccines, such as:  Influenza vaccine. This is recommended every year.  Tetanus, diphtheria, and acellular pertussis (Tdap, Td) vaccine.  You may need a Td booster every 10 years.  Zoster vaccine. You may need this after age 22.  Pneumococcal 13-valent conjugate (PCV13) vaccine. One dose is recommended after age 76.  Pneumococcal polysaccharide (PPSV23) vaccine. One  dose is recommended after age 69. Talk to your health care provider about which screenings and vaccines you need and how often you need them. This information is not intended to replace advice given to you by your health care provider. Make sure you discuss any questions you have with your health care provider. Document Released: 05/15/2015 Document Revised: 01/06/2016 Document Reviewed: 02/17/2015 Elsevier Interactive Patient Education  2017 [place] Prevention in the Home Falls can cause injuries. They can happen to people of all ages. There are many things you can do to make your home safe and to help prevent falls. What can I do on the outside of my home?  Regularly fix the edges of walkways and driveways and fix any cracks.  Remove anything that might make you trip as you walk through a door, such as a raised step or threshold.  Trim any bushes or trees on the path to your home.  Use bright outdoor lighting.  Clear any walking paths of anything that might make someone trip, such as rocks or tools.  Regularly check to see if handrails are loose or broken. Make sure that both sides of any steps have handrails.  Any raised decks and porches should have guardrails on the edges.  Have any leaves, snow, or ice cleared regularly.  Use sand or salt on walking paths during winter.  Clean up any spills in your garage right away. This includes oil or grease spills. What can I do in the bathroom?  Use night lights.  Install grab bars by the toilet and in the tub and shower. Do not use towel bars as grab bars.  Use non-skid mats or decals in the tub or shower.  If you need to sit down in the shower, use a plastic, non-slip stool.  Keep the floor dry. Clean up any water that spills on the floor as soon as it happens.  Remove soap buildup in the tub or shower regularly.  Attach bath mats securely with double-sided non-slip rug tape.  Do not have throw rugs and other  things on the floor that can make you trip. What can I do in the bedroom?  Use night lights.  Make sure that you have a light by your bed that is easy to reach.  Do not use any sheets or blankets that are too big for your bed. They should not hang down onto the floor.  Have a firm chair that has side arms. You can use this for support while you get dressed.  Do not have throw rugs and other things on the floor that can make you trip. What can I do in the kitchen?  Clean up any spills right away.  Avoid walking on wet floors.  Keep items that you use a lot in easy-to-reach places.  If you need to reach something above you, use a strong step stool that has a grab bar.  Keep electrical cords out of the way.  Do not use floor polish or wax that makes floors slippery. If you must use wax, use non-skid floor wax.  Do not have throw rugs and other things on the floor that can make you trip. What can I do with my stairs?  Do not leave any  items on the stairs.  Make sure that there are handrails on both sides of the stairs and use them. Fix handrails that are broken or loose. Make sure that handrails are as long as the stairways.  Check any carpeting to make sure that it is firmly attached to the stairs. Fix any carpet that is loose or worn.  Avoid having throw rugs at the top or bottom of the stairs. If you do have throw rugs, attach them to the floor with carpet tape.  Make sure that you have a light switch at the top of the stairs and the bottom of the stairs. If you do not have them, ask someone to add them for you. What else can I do to help prevent falls?  Wear shoes that:  Do not have high heels.  Have rubber bottoms.  Are comfortable and fit you well.  Are closed at the toe. Do not wear sandals.  If you use a stepladder:  Make sure that it is fully opened. Do not climb a closed stepladder.  Make sure that both sides of the stepladder are locked into place.  Ask  someone to hold it for you, if possible.  Clearly mark and make sure that you can see:  Any grab bars or handrails.  First and last steps.  Where the edge of each step is.  Use tools that help you move around (mobility aids) if they are needed. These include:  Canes.  Walkers.  Scooters.  Crutches.  Turn on the lights when you go into a dark area. Replace any light bulbs as soon as they burn out.  Set up your furniture so you have a clear path. Avoid moving your furniture around.  If any of your floors are uneven, fix them.  If there are any pets around you, be aware of where they are.  Review your medicines with your doctor. Some medicines can make you feel dizzy. This can increase your chance of falling. Ask your doctor what other things that you can do to help prevent falls. This information is not intended to replace advice given to you by your health care provider. Make sure you discuss any questions you have with your health care provider. Document Released: 02/12/2009 Document Revised: 09/24/2015 Document Reviewed: 05/23/2014 Elsevier Interactive Patient Education  2017 [employer].

## 2018-01-30 NOTE — Progress Notes (Signed)
Subjective:   Christine [name] is a 77 y.o. female who presents for Medicare Annual (Subsequent) preventive examination at [place]  Last AWV-01/19/2017    Objective:     Vitals: BP 125/70 (BP Location: Left Arm, Patient Position: Sitting)   Pulse 80   Temp 97.8 F (36.6 C) (Oral)   Ht 5\' 6"  (1.676 m)   Wt 202 lb (91.6 kg)   BMI 32.60 kg/m   Body mass index is 32.6 kg/m.  Advanced Directives 01/30/2018 01/29/2018 01/23/2018 01/09/2018 12/26/2017 12/11/2017 12/04/2017  Does Patient Have a Medical Advance Directive? Yes Yes Yes Yes Yes Yes Yes  Type of Advance Directive Out of facility DNR (pink MOST or yellow form) Out of facility DNR (pink MOST or yellow form) Out of facility DNR (pink MOST or yellow form) Out of facility DNR (pink MOST or yellow form) Out of facility DNR (pink MOST or yellow form) Out of facility DNR (pink MOST or yellow form) Out of facility DNR (pink MOST or yellow form)  Does patient want to make changes to medical advance directive? No - Patient declined No - Patient declined No - Patient declined No - Patient declined No - Patient declined No - Patient declined No - Patient declined  Copy of [place] in Chart? No - copy requested No - copy requested No - copy requested No - copy requested No - copy requested No - copy requested No - copy requested  Pre-existing out of facility DNR order (yellow form or pink MOST form) - - - - - - -    Tobacco Social History   Tobacco Use  Smoking Status Never Smoker  Smokeless Tobacco Never Used     Counseling given: Not Answered   Clinical Intake:  Pre-visit preparation completed: No  Pain : No/denies pain     Diabetes: Yes CBG done?: No Did pt. bring in CBG monitor from home?: No  How often do you need to have someone help you when you read instructions, pamphlets, or other written materials from your doctor or pharmacy?: 3 - Sometimes  Interpreter Needed?:  No  Information entered by :: Tyson Dense, RN  Past Medical History:  Diagnosis Date  . Allergy   . Atrial fibrillation ([place]) 08/2011   First diagnosed in 08/2011; duration of arrhythmia is uncertain  . Bilateral lower extremity edema   . Chronic diarrhea    diverticulosis  . COPD (chronic obstructive pulmonary disease) ([place])   . Dementia ([place])   . Diabetes mellitus, type 2 (HCC)    Diabetic neuropathy  . Dyspnea on exertion    pedal edema  . Gout   . Headache(784.0)    twice weekly  . Hyperlipidemia   . Hypertension   . Osteopenia    DEXA scan 01/2010  . Palpitations   . Seasonal allergies   . Stress incontinence   . Vertigo    Past Surgical History:  Procedure Laterality Date  . APPENDECTOMY    . BREAST BIOPSY  2002   Right  . CESAREAN SECTION     x 2  . CHOLECYSTECTOMY    . COLONOSCOPY  2007   Negative screening study  . COLONOSCOPY WITH PROPOFOL N/A 09/18/2017   Procedure: COLONOSCOPY WITH PROPOFOL;  Surgeon: Daneil Dolin, MD;  [facility];  Service: Endoscopy;  Laterality: N/A;  11:00am  . HAMMER TOE SURGERY     Bilateral hammer toe amputation  . INCISIONAL HERNIA  REPAIR    . KNEE ARTHROSCOPY W/ MENISCAL REPAIR  2007   Bilateral  . POLYPECTOMY  09/18/2017   Procedure: POLYPECTOMY;  Surgeon: Daneil Dolin, MD;  Location: [facility];  Service: Endoscopy;;  colon  . UMBILICAL HERNIA REPAIR     Family History  Adopted: Yes   Social History   Socioeconomic History  . Marital status: Widowed    Spouse name: Not on file  . Number of children: 2  . Years of education: Not on file  . Highest education level: Not on file  Occupational History  . Occupation: Pension scheme manager  . Financial resource strain: Not hard at all  . Food insecurity:    Worry: Never true    Inability: Never true  . Transportation needs:    Medical: No    Non-medical: No  Tobacco Use  . Smoking status: Never Smoker  . Smokeless tobacco: Never  Used  Substance and Sexual Activity  . Alcohol use: No  . Drug use: No  . Sexual activity: Not on file  Lifestyle  . Physical activity:    Days per week: 0 days    Minutes per session: 0 min  . Stress: Not at all  Relationships  . Social connections:    Talks on phone: Never    Gets together: Never    Attends religious service: Never    Active member of club or organization: No    Attends meetings of clubs or organizations: Never    Relationship status: Widowed  Other Topics Concern  . Not on file  Social History Narrative  . Not on file    Outpatient Encounter Medications as of 01/30/2018  Medication Sig  . acetaminophen (TYLENOL) 325 MG tablet Take 650 mg by mouth every 6 (six) hours as needed.   Marland Kitchen alendronate (FOSAMAX) 70 MG tablet Give 1 tablet by mouth on Sunday. Take with a full glass of water on an empty stomach.  Marland Kitchen apixaban (ELIQUIS) 5 MG TABS tablet Take 5 mg by mouth 2 (two) times daily.   . carboxymethylcellulose (REFRESH PLUS) 0.5 % SOLN Place 1 drop into both eyes 4 (four) times daily.  . cholecalciferol (VITAMIN D) 1000 UNITS tablet Take 1,000 Units by mouth daily.  . citalopram (CELEXA) 20 MG tablet Take 1 tablet (20 mg total) by mouth daily.  . digoxin (LANOXIN) 0.125 MG tablet Take 1 tablet by mouth once a day (HOLD FOR AP UNDER 60)  . furosemide (LASIX) 20 MG tablet Take 40 mg by mouth daily.   . insulin aspart (NOVOLOG FLEXPEN) 100 UNIT/ML FlexPen Inject 12 Units into the skin 2 (two) times daily.   . Insulin Glargine (BASAGLAR KWIKPEN) 100 UNIT/ML SOPN Inject 44 Units into the skin at bedtime.   . Insulin Pen Needle (EASY TOUCH PEN NEEDLES) 31G X 8 MM MISC Use twice a day  . memantine (NAMENDA) 10 MG tablet Take 10 mg by mouth 2 (two) times daily.  . metolazone (ZAROXOLYN) 2.5 MG tablet Take 5 mg by mouth daily. Mon. Fri   . metoprolol tartrate (LOPRESSOR) 25 MG tablet Take 75 mg by mouth 2 (two) times daily.  . Multiple Vitamin (MULTIVITAMIN WITH MINERALS)  TABS Take 1 tablet by mouth every morning.  Marland Kitchen omeprazole (PRILOSEC) 40 MG capsule Take 40 mg by mouth daily.  . polyethylene glycol (MIRALAX / GLYCOLAX) packet Take 17 g by mouth daily.  . potassium chloride (MICRO-K) 10 MEQ CR capsule Take 20 mEq by  mouth daily. Potassium chloride extended release capsule, give 20 meq by mouth once an evening  . potassium chloride SA (K-DUR,KLOR-CON) 20 MEQ tablet Take 40 mEq by mouth daily.   . pravastatin (PRAVACHOL) 20 MG tablet Take 20 mg by mouth daily. Take along with 40 mg to = 60 mg  . pravastatin (PRAVACHOL) 40 MG tablet Take 40 mg by mouth daily. Take along with 20 mg to = 60 mg  . sitaGLIPtin-metformin (JANUMET) 50-500 MG tablet Take 1 tablet by mouth 2 (two) times daily with a meal.   No facility-administered encounter medications on file as of 01/30/2018.     Activities of Daily Living In your present state of health, do you have any difficulty performing the following activities: 01/30/2018  Hearing? Y  Vision? Y  Difficulty concentrating or making decisions? Y  Walking or climbing stairs? Y  Dressing or bathing? N  Doing errands, shopping? Y  Preparing Food and eating ? Y  Using the Toilet? N  In the past six months, have you accidently leaked urine? Y  Do you have problems with loss of bowel control? Y  Managing your Medications? Y  Managing your Finances? Y  Housekeeping or managing your Housekeeping? Y  Some recent data might be hidden    Patient Care Team: Celedonio Savage, MD as PCP - General (Family Medicine) Rothbart, Cristopher Estimable, MD (Cardiology) Lendon Colonel, NP as Nurse Practitioner (Nurse Practitioner)    Assessment:   This is a routine wellness examination for Karole.  Exercise Activities and Dietary recommendations Current Exercise Habits: The patient does not participate in regular exercise at present, Exercise limited by: orthopedic condition(s);neurologic condition(s)  Goals   None     Fall Risk Fall Risk   01/30/2018 01/19/2017 11/06/2015 09/29/2015 08/31/2015  Falls in the past year? No No Yes No No  Number falls in past yr: - - 2 or more - -  Injury with Fall? - - No - -   Is the patient's home free of loose throw rugs in walkways, pet beds, electrical cords, etc?   yes      Grab bars in the bathroom? yes      Handrails on the stairs?   yes      Adequate lighting?   yes  Depression Screen PHQ 2/9 Scores 01/30/2018 01/19/2017 11/06/2015 09/29/2015  PHQ - 2 Score 0 0 1 0     Cognitive Function MMSE - Mini Mental State Exam 01/19/2017  Not completed: Refused     6CIT Screen 01/30/2018  What Year? 4 points  What month? 3 points  What time? 3 points  Count back from 20 4 points  Months in reverse 4 points  Repeat phrase 8 points  Total Score 26    Immunization History  Administered Date(s) Administered  . Influenza-Unspecified 02/04/2016, 02/03/2017  . Pneumococcal Conjugate-13 02/03/2017  . Pneumococcal-Unspecified 02/08/2016  . Tdap 01/19/2017    Qualifies for Shingles Vaccine? Not in past records  Screening Tests Health Maintenance  Topic Date Due  . INFLUENZA VACCINE  02/08/2018 (Originally 11/30/2017)  . OPHTHALMOLOGY EXAM  02/22/2018  . HEMOGLOBIN A1C  07/24/2018  . URINE MICROALBUMIN  08/23/2018  . FOOT EXAM  10/31/2018  . TETANUS/TDAP  01/20/2027  . DEXA SCAN  Completed  . PNA vac Low Risk Adult  Completed    Cancer Screenings: Lung: Low Dose CT Chest recommended if Age 11-80 years, 30 pack-year currently smoking OR have quit w/in 15years. Patient does not qualify. Breast:  Up to date on Mammogram? Yes   Up to date of Bone Density/Dexa? Yes Colorectal: up to date  Additional Screenings:  Hepatitis C Screening: declined Flu vaccine due: will receive at [place]:    I have personally reviewed and addressed the Medicare Annual Wellness questionnaire and have noted the following in the patient's chart:  A. Medical and social history B. Use of alcohol,  tobacco or illicit drugs  C. Current medications and supplements D. Functional ability and status E.  Nutritional status F.  Physical activity G. Advance directives H. List of other physicians I.  Hospitalizations, surgeries, and ER visits in previous 12 months J.  [place] to include hearing, vision, cognitive, depression L. Referrals and appointments - none  In addition, I have reviewed and discussed with patient certain preventive protocols, quality metrics, and best practice recommendations. A written personalized care plan for preventive services as well as general preventive health recommendations were provided to patient.  See attached scanned questionnaire for additional information.   Signed,   Tyson Dense, RN Nurse Health Advisor  Patient Concerns: None

## 2018-02-13 ENCOUNTER — Non-Acute Institutional Stay (SKILLED_NURSING_FACILITY): Payer: Medicare Other | Admitting: Internal Medicine

## 2018-02-13 ENCOUNTER — Encounter: Payer: Self-pay | Admitting: Internal Medicine

## 2018-02-13 DIAGNOSIS — E1140 Type 2 diabetes mellitus with diabetic neuropathy, unspecified: Secondary | ICD-10-CM

## 2018-02-13 DIAGNOSIS — R600 Localized edema: Secondary | ICD-10-CM

## 2018-02-13 DIAGNOSIS — Z794 Long term (current) use of insulin: Secondary | ICD-10-CM

## 2018-02-13 NOTE — Progress Notes (Signed)
Location:    [facility] [place] Room Number: 113/W Place of Service:  SNF [phone]) Provider:  Veleta Miners MD  Celedonio Savage, MD  Patient Care Team: Celedonio Savage, MD as PCP - General (Family Medicine) Rothbart, Cristopher Estimable, MD (Cardiology) Lendon Colonel, NP as Nurse Practitioner (Nurse Practitioner)  Extended Emergency Contact Information Primary Emergency Contact: [facility] Address: [address]          [place], [place] [address] [name] of [place] Phone: [phone] Mobile Phone: [phone] Relation: Daughter  Code Status:  DNR Goals of care: Advanced Directive information Advanced Directives 02/13/2018  Does Patient Have a Medical Advance Directive? Yes  Type of Advance Directive Out of facility DNR (pink MOST or yellow form)  Does patient want to make changes to medical advance directive? No - Patient declined  Copy of [place] in Chart? No - copy requested  Pre-existing out of facility DNR order (yellow form or pink MOST form) -     Chief Complaint  Patient presents with  . Acute Visit    F/U BS    HPI:  Pt is a 77 y.o. female seen today for an acute visit for BS Follow up  Patient has h/o Diabetes mellitus type 2 , Atrial fibrillation on chronic anticoagulation, Venous stasis, Dementia with Behavior problems, S/P fall leading to Ocular floor fracture.Recurrent Cellulitis oFRightLE.and h/o GI bleed s/p Colonoscopy with Polypectomy Patient Unable to Give any history  Due to her Dementia Patient has been transitioned on Lantus Insulin from 70/30 and her BS have been running high with last A1C was 8.7 The dose has been adjusted. She also in [place]. And Bolus Insulin with Meals. Her sugars are doing little better. The morning Sugars some days are less then 100. She still go more then 250 in afternoon and Evening.  No recent Signs of Infection. Her Right Leg stays swollen and slight Red. But no cellulitis. Her  Appetite is good.   Past Medical History:  Diagnosis Date  . Allergy   . Atrial fibrillation ([place]) 08/2011   First diagnosed in 08/2011; duration of arrhythmia is uncertain  . Bilateral lower extremity edema   . Chronic diarrhea    diverticulosis  . COPD (chronic obstructive pulmonary disease) ([place])   . Dementia ([place])   . Diabetes mellitus, type 2 (HCC)    Diabetic neuropathy  . Dyspnea on exertion    pedal edema  . Gout   . Headache(784.0)    twice weekly  . Hyperlipidemia   . Hypertension   . Osteopenia    DEXA scan 01/2010  . Palpitations   . Seasonal allergies   . Stress incontinence   . Vertigo    Past Surgical History:  Procedure Laterality Date  . APPENDECTOMY    . BREAST BIOPSY  2002   Right  . CESAREAN SECTION     x 2  . CHOLECYSTECTOMY    . COLONOSCOPY  2007   Negative screening study  . COLONOSCOPY WITH PROPOFOL N/A 09/18/2017   Procedure: COLONOSCOPY WITH PROPOFOL;  Surgeon: Daneil Dolin, MD;  [facility];  Service: Endoscopy;  Laterality: N/A;  11:00am  . HAMMER TOE SURGERY     Bilateral hammer toe amputation  . INCISIONAL HERNIA REPAIR    . KNEE ARTHROSCOPY W/ MENISCAL REPAIR  2007   Bilateral  . POLYPECTOMY  09/18/2017   Procedure: POLYPECTOMY;  Surgeon: Daneil Dolin, MD;  [facility];  Service:  Endoscopy;;  colon  . UMBILICAL HERNIA REPAIR      Allergies  Allergen Reactions  . Codeine Anaphylaxis and Hives  . Morphine And Related Anaphylaxis and Hives  . Penicillins Anaphylaxis  . Ace Inhibitors Cough    Outpatient Encounter Medications as of 02/13/2018  Medication Sig  . acetaminophen (TYLENOL) 325 MG tablet Take 650 mg by mouth every 6 (six) hours as needed.   Marland Kitchen alendronate (FOSAMAX) 70 MG tablet Give 1 tablet by mouth on Sunday. Take with a full glass of water on an empty stomach.  Marland Kitchen apixaban (ELIQUIS) 5 MG TABS tablet Take 5 mg by mouth 2 (two) times daily.   . carboxymethylcellulose (REFRESH PLUS) 0.5 %  SOLN Place 1 drop into both eyes 4 (four) times daily.  . cholecalciferol (VITAMIN D) 1000 UNITS tablet Take 1,000 Units by mouth daily.  . citalopram (CELEXA) 20 MG tablet Take 1 tablet (20 mg total) by mouth daily.  . digoxin (LANOXIN) 0.125 MG tablet Take 1 tablet by mouth once a day (HOLD FOR AP UNDER 60)  . furosemide (LASIX) 20 MG tablet Take 40 mg by mouth daily.   . insulin aspart (NOVOLOG FLEXPEN) 100 UNIT/ML FlexPen Inject 12 Units into the skin 2 (two) times daily.   . Insulin Glargine (BASAGLAR KWIKPEN) 100 UNIT/ML SOPN Inject 48 Units into the skin at bedtime.   . Insulin Pen Needle (EASY TOUCH PEN NEEDLES) 31G X 8 MM MISC Use twice a day  . memantine (NAMENDA) 10 MG tablet Take 10 mg by mouth 2 (two) times daily.  . metolazone (ZAROXOLYN) 2.5 MG tablet Take 5 mg by mouth daily. Mon. Fri   . metoprolol tartrate (LOPRESSOR) 25 MG tablet Take 75 mg by mouth 2 (two) times daily.  . Multiple Vitamin (MULTIVITAMIN WITH MINERALS) TABS Take 1 tablet by mouth every morning.  Marland Kitchen omeprazole (PRILOSEC) 40 MG capsule Take 40 mg by mouth daily.  . polyethylene glycol (MIRALAX / GLYCOLAX) packet Take 17 g by mouth daily.  . potassium chloride (MICRO-K) 10 MEQ CR capsule Take 20 mEq by mouth daily. Potassium chloride extended release capsule, give 20 meq by mouth once an evening  . pravastatin (PRAVACHOL) 20 MG tablet Take 20 mg by mouth daily. Take along with 40 mg to = 60 mg  . pravastatin (PRAVACHOL) 40 MG tablet Take 40 mg by mouth daily. Take along with 20 mg to = 60 mg  . sitaGLIPtin-metformin (JANUMET) 50-500 MG tablet Take 1 tablet by mouth 2 (two) times daily with a meal.  . [DISCONTINUED] potassium chloride SA (K-DUR,KLOR-CON) 20 MEQ tablet Take 40 mEq by mouth daily.    No facility-administered encounter medications on file as of 02/13/2018.      Review of Systems  Unable to perform ROS: Dementia    Immunization History  Administered Date(s) Administered  . Influenza-Unspecified  02/04/2016, 02/03/2017  . Pneumococcal Conjugate-13 02/03/2017  . Pneumococcal-Unspecified 02/08/2016  . Tdap 01/19/2017   Pertinent  Health Maintenance Due  Topic Date Due  . OPHTHALMOLOGY EXAM  02/22/2018  . HEMOGLOBIN A1C  07/24/2018  . URINE MICROALBUMIN  08/23/2018  . FOOT EXAM  10/31/2018  . INFLUENZA VACCINE  Completed  . DEXA SCAN  Completed  . PNA vac Low Risk Adult  Completed   Fall Risk  01/30/2018 01/19/2017 11/06/2015 09/29/2015 08/31/2015  Falls in the past year? No No Yes No No  Number falls in past yr: - - 2 or more - -  Injury with Fall? - -  No - -   Functional Status Survey:    Vitals:   02/13/18 1456  BP: 103/71  Pulse: 78  Resp: 19  Temp: 97.9 F (36.6 C)  TempSrc: Oral   There is no height or weight on file to calculate BMI. Physical Exam  Constitutional: She appears well-developed and well-nourished.  HENT:  Head: Normocephalic.  Eyes: Pupils are equal, round, and reactive to light.  Neck: Neck supple.  Cardiovascular: Normal rate. An irregular rhythm present.  No murmur heard. Pulmonary/Chest: Effort normal and breath sounds normal. No respiratory distress. She has no wheezes.  Abdominal: Soft. She exhibits no distension. There is no tenderness. There is no guarding.  Musculoskeletal:  Bilateral Edema Right More then Left.With Some redness in the Posterior side of Right LE   Neurological: She is alert.  Skin: Skin is warm and dry.  Psychiatric: She has a normal mood and affect. Her behavior is normal. Thought content normal.    Labs reviewed: Recent Labs    12/30/17 0415 01/02/18 0700 01/23/18 0733  NA 136 139 140  K 4.1 3.5 3.8  CL 98 98 102  CO2 30 28 29   GLUCOSE 205* 178* 194*  BUN 18 25* 21  CREATININE 0.99 1.25* 1.09*  CALCIUM 8.9 9.3 8.8*   Recent Labs    08/08/17 1459 11/08/17 1625  AST 21 25  ALT 15 19  ALKPHOS 54 52  BILITOT 0.5 0.5  PROT 7.5 7.9  ALBUMIN 3.3* 3.3*   Recent Labs    10/19/17 0729 11/08/17 1625  11/27/17 0700 12/18/17 0700  WBC 9.1 9.8 9.7 9.1  NEUTROABS 5.2 5.6 5.3  --   HGB 11.6* 11.9* 12.3 11.8*  HCT 38.3 38.2 39.6 38.1  MCV 90.8 90.5 89.0 89.6  PLT 224 248 233 217   Lab Results  Component Value Date   TSH 3.269 11/08/2017   Lab Results  Component Value Date   HGBA1C 8.7 (H) 01/23/2018   Lab Results  Component Value Date   CHOL 149 12/18/2017   HDL 31 (L) 12/18/2017   LDLCALC 87 12/18/2017   TRIG 157 (H) 12/18/2017   CHOLHDL 4.8 12/18/2017    Significant Diagnostic Results in last 30 days:  No results found.  Assessment/Plan  Diabetes Mellitus with Neuropathy Continue on Lantus 48 units. On Bolus Sliding Scale insulin for [employer]. Continue Accu checks. LE edema with Recurrent Cellulitis Patient stable now on Lasix and Metolazone twice  Week. Her weight is 197 lbs and stable. She has not had Cellulitis recently Renal Function stable.       Family/ staff Communication:   Labs/tests ordered:   Total time spent in this patient care encounter was 25_ minutes; greater than 50% of the visit spent counseling patient, reviewing records , Labs and coordinating care for problems addressed at this encounter.

## 2018-02-26 DIAGNOSIS — Z7984 Long term (current) use of oral hypoglycemic drugs: Secondary | ICD-10-CM | POA: Diagnosis not present

## 2018-02-26 DIAGNOSIS — Z794 Long term (current) use of insulin: Secondary | ICD-10-CM | POA: Diagnosis not present

## 2018-02-26 DIAGNOSIS — E113293 Type 2 diabetes mellitus with mild nonproliferative diabetic retinopathy without macular edema, bilateral: Secondary | ICD-10-CM | POA: Diagnosis not present

## 2018-02-26 DIAGNOSIS — H04123 Dry eye syndrome of bilateral lacrimal glands: Secondary | ICD-10-CM | POA: Diagnosis not present

## 2018-02-27 DIAGNOSIS — R1310 Dysphagia, unspecified: Secondary | ICD-10-CM | POA: Diagnosis not present

## 2018-02-27 DIAGNOSIS — E1140 Type 2 diabetes mellitus with diabetic neuropathy, unspecified: Secondary | ICD-10-CM | POA: Diagnosis not present

## 2018-02-27 DIAGNOSIS — F329 Major depressive disorder, single episode, unspecified: Secondary | ICD-10-CM | POA: Diagnosis not present

## 2018-02-27 DIAGNOSIS — F0390 Unspecified dementia without behavioral disturbance: Secondary | ICD-10-CM | POA: Diagnosis not present

## 2018-03-02 DIAGNOSIS — F329 Major depressive disorder, single episode, unspecified: Secondary | ICD-10-CM | POA: Diagnosis not present

## 2018-03-02 DIAGNOSIS — E1140 Type 2 diabetes mellitus with diabetic neuropathy, unspecified: Secondary | ICD-10-CM | POA: Diagnosis not present

## 2018-03-02 DIAGNOSIS — F0390 Unspecified dementia without behavioral disturbance: Secondary | ICD-10-CM | POA: Diagnosis not present

## 2018-03-02 DIAGNOSIS — R1310 Dysphagia, unspecified: Secondary | ICD-10-CM | POA: Diagnosis not present

## 2018-03-05 DIAGNOSIS — F0390 Unspecified dementia without behavioral disturbance: Secondary | ICD-10-CM | POA: Diagnosis not present

## 2018-03-05 DIAGNOSIS — R1310 Dysphagia, unspecified: Secondary | ICD-10-CM | POA: Diagnosis not present

## 2018-03-05 DIAGNOSIS — F329 Major depressive disorder, single episode, unspecified: Secondary | ICD-10-CM | POA: Diagnosis not present

## 2018-03-05 DIAGNOSIS — E1140 Type 2 diabetes mellitus with diabetic neuropathy, unspecified: Secondary | ICD-10-CM | POA: Diagnosis not present

## 2018-03-07 DIAGNOSIS — E1140 Type 2 diabetes mellitus with diabetic neuropathy, unspecified: Secondary | ICD-10-CM | POA: Diagnosis not present

## 2018-03-07 DIAGNOSIS — R1310 Dysphagia, unspecified: Secondary | ICD-10-CM | POA: Diagnosis not present

## 2018-03-07 DIAGNOSIS — F329 Major depressive disorder, single episode, unspecified: Secondary | ICD-10-CM | POA: Diagnosis not present

## 2018-03-07 DIAGNOSIS — F0390 Unspecified dementia without behavioral disturbance: Secondary | ICD-10-CM | POA: Diagnosis not present

## 2018-03-08 ENCOUNTER — Non-Acute Institutional Stay (SKILLED_NURSING_FACILITY): Payer: Medicare Other | Admitting: Internal Medicine

## 2018-03-08 ENCOUNTER — Encounter: Payer: Self-pay | Admitting: Internal Medicine

## 2018-03-08 DIAGNOSIS — Z794 Long term (current) use of insulin: Secondary | ICD-10-CM | POA: Diagnosis not present

## 2018-03-08 DIAGNOSIS — M810 Age-related osteoporosis without current pathological fracture: Secondary | ICD-10-CM

## 2018-03-08 DIAGNOSIS — I482 Chronic atrial fibrillation, unspecified: Secondary | ICD-10-CM

## 2018-03-08 DIAGNOSIS — I10 Essential (primary) hypertension: Secondary | ICD-10-CM

## 2018-03-08 DIAGNOSIS — E1140 Type 2 diabetes mellitus with diabetic neuropathy, unspecified: Secondary | ICD-10-CM

## 2018-03-08 DIAGNOSIS — E785 Hyperlipidemia, unspecified: Secondary | ICD-10-CM

## 2018-03-08 NOTE — Progress Notes (Signed)
Location:    [facility] Room Number: 113/W Place of Service:  SNF [phone]) Provider: Veleta Miners MD  Celedonio Savage, MD  Patient Care Team: Celedonio Savage, MD as PCP - General (Family Medicine) Rothbart, Cristopher Estimable, MD (Cardiology) Lendon Colonel, NP as Nurse Practitioner (Nurse Practitioner)  Extended Emergency Contact Information Primary Emergency Contact: [facility] Address: [address]          [place], [place] [address] [name] of [place] Phone: [phone] Mobile Phone: [phone] Relation: Daughter  Code Status:  DNR Goals of care: Advanced Directive information Advanced Directives 03/08/2018  Does Patient Have a Medical Advance Directive? Yes  Type of Advance Directive Out of facility DNR (pink MOST or yellow form)  Does patient want to make changes to medical advance directive? No - Patient declined  Copy of [place] in Chart? No - copy requested  Pre-existing out of facility DNR order (yellow form or pink MOST form) -     Chief Complaint  Patient presents with  . Medical Management of Chronic Issues    Routine visit of medical management    HPI:  Pt is a 77 y.o. female seen today for medical management of chronic diseases.    Patient has h/o Diabetes mellitus type 2 , atrial fibrillation on chronic anticoagulation, Venous stasis, Dementia with Behavior problems, S/P fall leading to Ocular floor fracture.Recurrent Cellulitis oF Right  LE.and r GI bleed s/p Colonoscopy with Polypectomy Patient has been stable in the Facility. She did not have any New Nursing issues Her weight has now stabilized at 199 Lbs Appetite is good. She continues to be Full Care Due to her Dementia She is unable to give any detail History   Past Medical History:  Diagnosis Date  . Allergy   . Atrial fibrillation ([place]) 08/2011   First diagnosed in 08/2011; duration of arrhythmia is uncertain  . Bilateral lower extremity edema   .  Chronic diarrhea    diverticulosis  . COPD (chronic obstructive pulmonary disease) ([place])   . Dementia ([place])   . Diabetes mellitus, type 2 (HCC)    Diabetic neuropathy  . Dyspnea on exertion    pedal edema  . Gout   . Headache(784.0)    twice weekly  . Hyperlipidemia   . Hypertension   . Osteopenia    DEXA scan 01/2010  . Palpitations   . Seasonal allergies   . Stress incontinence   . Vertigo    Past Surgical History:  Procedure Laterality Date  . APPENDECTOMY    . BREAST BIOPSY  2002   Right  . CESAREAN SECTION     x 2  . CHOLECYSTECTOMY    . COLONOSCOPY  2007   Negative screening study  . COLONOSCOPY WITH PROPOFOL N/A 09/18/2017   Procedure: COLONOSCOPY WITH PROPOFOL;  Surgeon: Daneil Dolin, MD;  [facility];  Service: Endoscopy;  Laterality: N/A;  11:00am  . HAMMER TOE SURGERY     Bilateral hammer toe amputation  . INCISIONAL HERNIA REPAIR    . KNEE ARTHROSCOPY W/ MENISCAL REPAIR  2007   Bilateral  . POLYPECTOMY  09/18/2017   Procedure: POLYPECTOMY;  Surgeon: Daneil Dolin, MD;  [facility];  Service: Endoscopy;;  colon  . UMBILICAL HERNIA REPAIR      Allergies  Allergen Reactions  . Codeine Anaphylaxis and Hives  . Morphine And Related Anaphylaxis and Hives  . Penicillins Anaphylaxis  . Ace Inhibitors Cough  Outpatient Encounter Medications as of 03/08/2018  Medication Sig  . acetaminophen (TYLENOL) 325 MG tablet Take 650 mg by mouth every 6 (six) hours as needed.   Marland Kitchen alendronate (FOSAMAX) 70 MG tablet Give 1 tablet by mouth on Sunday. Take with a full glass of water on an empty stomach.  Marland Kitchen apixaban (ELIQUIS) 5 MG TABS tablet Take 5 mg by mouth 2 (two) times daily.   . carboxymethylcellulose (REFRESH PLUS) 0.5 % SOLN Place 1 drop into both eyes 4 (four) times daily.  . cholecalciferol (VITAMIN D) 1000 UNITS tablet Take 1,000 Units by mouth daily.  . citalopram (CELEXA) 20 MG tablet Take 1 tablet (20 mg total) by mouth daily.  .  digoxin (LANOXIN) 0.125 MG tablet Take 1 tablet by mouth once a day (HOLD FOR AP UNDER 60)  . furosemide (LASIX) 20 MG tablet Take 40 mg by mouth daily.   . insulin aspart (NOVOLOG FLEXPEN) 100 UNIT/ML FlexPen Inject 12 Units into the skin 2 (two) times daily.   . Insulin Glargine (BASAGLAR KWIKPEN) 100 UNIT/ML SOPN Inject 48 Units into the skin at bedtime.   . Insulin Pen Needle (EASY TOUCH PEN NEEDLES) 31G X 8 MM MISC Use twice a day  . memantine (NAMENDA) 10 MG tablet Take 10 mg by mouth 2 (two) times daily.  . metolazone (ZAROXOLYN) 2.5 MG tablet Take 5 mg by mouth daily. Mon. Fri   . metoprolol tartrate (LOPRESSOR) 25 MG tablet Take 75 mg by mouth 2 (two) times daily.  . Multiple Vitamin (MULTIVITAMIN WITH MINERALS) TABS Take 1 tablet by mouth every morning.  Marland Kitchen omeprazole (PRILOSEC) 40 MG capsule Take 40 mg by mouth daily.  . polyethylene glycol (MIRALAX / GLYCOLAX) packet Take 17 g by mouth daily.  . potassium chloride (MICRO-K) 10 MEQ CR capsule Take 40 mEq by mouth daily. Potassium chloride extended release capsule, give 20 meq by mouth once an evening  . pravastatin (PRAVACHOL) 20 MG tablet Take 20 mg by mouth daily. Take along with 40 mg to = 60 mg  . pravastatin (PRAVACHOL) 40 MG tablet Take 40 mg by mouth daily. Take along with 20 mg to = 60 mg  . sitaGLIPtin-metformin (JANUMET) 50-500 MG tablet Take 1 tablet by mouth 2 (two) times daily with a meal.   No facility-administered encounter medications on file as of 03/08/2018.      Review of Systems  Unable to perform ROS: Dementia    Immunization History  Administered Date(s) Administered  . Influenza-Unspecified 02/04/2016, 02/03/2017  . Pneumococcal Conjugate-13 02/03/2017  . Pneumococcal-Unspecified 02/08/2016  . Tdap 01/19/2017   Pertinent  Health Maintenance Due  Topic Date Due  . HEMOGLOBIN A1C  07/24/2018  . URINE MICROALBUMIN  08/23/2018  . FOOT EXAM  10/31/2018  . OPHTHALMOLOGY EXAM  03/07/2019  . INFLUENZA  VACCINE  Completed  . DEXA SCAN  Completed  . PNA vac Low Risk Adult  Completed   Fall Risk  01/30/2018 01/19/2017 11/06/2015 09/29/2015 08/31/2015  Falls in the past year? No No Yes No No  Number falls in past yr: - - 2 or more - -  Injury with Fall? - - No - -   Functional Status Survey:    Vitals:   03/08/18 1140  BP: 114/61  Pulse: 75  Resp: 18  Temp: (!) 97.5 F (36.4 C)  TempSrc: Oral  SpO2: 97%  Weight: 199 lb (90.3 kg)  Height: 5\' 6"  (1.676 m)   Body mass index is 32.12 kg/m. Physical  Exam  Constitutional: She appears well-developed and well-nourished.  HENT:  Head: Normocephalic.  Eyes: Pupils are equal, round, and reactive to light.  Neck: Neck supple.  Cardiovascular: Normal rate. An irregular rhythm present.  No murmur heard. Pulmonary/Chest: Effort normal and breath sounds normal. No respiratory distress. She has no wheezes.  Abdominal: Soft. She exhibits no distension. There is no tenderness. There is no guarding.  Musculoskeletal:  Bilateral Edema Right More then Left.With Some redness in the Posterior side of Right LE   Neurological: She is alert.  Patient is Dependent for Her ADLS. She can transfer herself but is dependent on Pharmacist, community with [place] Not Oriented  Skin: Skin is warm and dry.  Psychiatric: She has a normal mood and affect. Her behavior is normal. Thought content normal.    Labs reviewed: Recent Labs    12/30/17 0415 01/02/18 0700 01/23/18 0733  NA 136 139 140  K 4.1 3.5 3.8  CL 98 98 102  CO2 30 28 29   GLUCOSE 205* 178* 194*  BUN 18 25* 21  CREATININE 0.99 1.25* 1.09*  CALCIUM 8.9 9.3 8.8*   Recent Labs    08/08/17 1459 11/08/17 1625  AST 21 25  ALT 15 19  ALKPHOS 54 52  BILITOT 0.5 0.5  PROT 7.5 7.9  ALBUMIN 3.3* 3.3*   Recent Labs    10/19/17 0729 11/08/17 1625 11/27/17 0700 12/18/17 0700  WBC 9.1 9.8 9.7 9.1  NEUTROABS 5.2 5.6 5.3  --   HGB 11.6* 11.9* 12.3 11.8*  HCT 38.3 38.2  39.6 38.1  MCV 90.8 90.5 89.0 89.6  PLT 224 248 233 217   Lab Results  Component Value Date   TSH 3.269 11/08/2017   Lab Results  Component Value Date   HGBA1C 8.7 (H) 01/23/2018   Lab Results  Component Value Date   CHOL 149 12/18/2017   HDL 31 (L) 12/18/2017   LDLCALC 87 12/18/2017   TRIG 157 (H) 12/18/2017   CHOLHDL 4.8 12/18/2017    Significant Diagnostic Results in last 30 days:  No results found.  Assessment/Plan Essential hypertension Patient's blood pressure stable on Lasix ,lopressor, metolazone She was taken off Lotensin due to low blood pressure  Chronic A. 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 Hgb stable    Family/ staff Communication:   Labs/tests ordered:

## 2018-03-09 DIAGNOSIS — F329 Major depressive disorder, single episode, unspecified: Secondary | ICD-10-CM | POA: Diagnosis not present

## 2018-03-09 DIAGNOSIS — R1310 Dysphagia, unspecified: Secondary | ICD-10-CM | POA: Diagnosis not present

## 2018-03-09 DIAGNOSIS — F0390 Unspecified dementia without behavioral disturbance: Secondary | ICD-10-CM | POA: Diagnosis not present

## 2018-03-09 DIAGNOSIS — E1140 Type 2 diabetes mellitus with diabetic neuropathy, unspecified: Secondary | ICD-10-CM | POA: Diagnosis not present

## 2018-03-12 DIAGNOSIS — F0390 Unspecified dementia without behavioral disturbance: Secondary | ICD-10-CM | POA: Diagnosis not present

## 2018-03-12 DIAGNOSIS — E1140 Type 2 diabetes mellitus with diabetic neuropathy, unspecified: Secondary | ICD-10-CM | POA: Diagnosis not present

## 2018-03-12 DIAGNOSIS — F329 Major depressive disorder, single episode, unspecified: Secondary | ICD-10-CM | POA: Diagnosis not present

## 2018-03-12 DIAGNOSIS — R1310 Dysphagia, unspecified: Secondary | ICD-10-CM | POA: Diagnosis not present

## 2018-03-14 DIAGNOSIS — F0390 Unspecified dementia without behavioral disturbance: Secondary | ICD-10-CM | POA: Diagnosis not present

## 2018-03-14 DIAGNOSIS — E1140 Type 2 diabetes mellitus with diabetic neuropathy, unspecified: Secondary | ICD-10-CM | POA: Diagnosis not present

## 2018-03-14 DIAGNOSIS — F329 Major depressive disorder, single episode, unspecified: Secondary | ICD-10-CM | POA: Diagnosis not present

## 2018-03-14 DIAGNOSIS — R1310 Dysphagia, unspecified: Secondary | ICD-10-CM | POA: Diagnosis not present

## 2018-03-16 DIAGNOSIS — F0390 Unspecified dementia without behavioral disturbance: Secondary | ICD-10-CM | POA: Diagnosis not present

## 2018-03-16 DIAGNOSIS — R1310 Dysphagia, unspecified: Secondary | ICD-10-CM | POA: Diagnosis not present

## 2018-03-16 DIAGNOSIS — F329 Major depressive disorder, single episode, unspecified: Secondary | ICD-10-CM | POA: Diagnosis not present

## 2018-03-16 DIAGNOSIS — E1140 Type 2 diabetes mellitus with diabetic neuropathy, unspecified: Secondary | ICD-10-CM | POA: Diagnosis not present

## 2018-03-19 ENCOUNTER — Encounter: Payer: Self-pay | Admitting: Internal Medicine

## 2018-03-19 ENCOUNTER — Non-Acute Institutional Stay (SKILLED_NURSING_FACILITY): Payer: Medicare Other | Admitting: Internal Medicine

## 2018-03-19 DIAGNOSIS — Z794 Long term (current) use of insulin: Secondary | ICD-10-CM

## 2018-03-19 DIAGNOSIS — W19XXXA Unspecified fall, initial encounter: Secondary | ICD-10-CM

## 2018-03-19 DIAGNOSIS — E1140 Type 2 diabetes mellitus with diabetic neuropathy, unspecified: Secondary | ICD-10-CM

## 2018-03-19 DIAGNOSIS — S0093XA Contusion of unspecified part of head, initial encounter: Secondary | ICD-10-CM | POA: Diagnosis not present

## 2018-03-19 DIAGNOSIS — I482 Chronic atrial fibrillation, unspecified: Secondary | ICD-10-CM

## 2018-03-19 NOTE — Progress Notes (Addendum)
Location:    [place] Room Number: 113/W Place of Service:  SNF [id number]) Provider:  Veleta Miners MD  Celedonio Savage, MD  Patient Care Team: Celedonio Savage, MD as PCP - General (Family Medicine) Rothbart, Cristopher Estimable, MD (Cardiology) Lendon Colonel, NP as Nurse Practitioner (Nurse Practitioner)  Extended Emergency Contact Information Primary Emergency Contact: [facility] Address: [address]          [place], [place] [address] [name] of [place] Phone: [phone] Mobile Phone: [phone] Relation: Daughter  Code Status:  DNR Goals of care: Advanced Directive information Advanced Directives 03/20/2018  Does Patient Have a Medical Advance Directive? Yes  Type of Advance Directive Out of facility DNR (pink MOST or yellow form)  Does patient want to make changes to medical advance directive? No - Patient declined  Copy of [place] in Chart? No - copy requested  Pre-existing out of facility DNR order (yellow form or pink MOST form) -     Chief Complaint  Patient presents with  . Acute Visit    Fall with injury     HPI:  Pt is a 77 y.o. female seen today for an acute visit for follow up after Fall  Patient has h/o Diabetes mellitus type 2 , atrial fibrillation on chronic anticoagulation, Venous stasis, Dementia with Behavior problems, S/P fall leading to Ocular floor fracture.Recurrent Cellulitis oFRightLE.and r GI bleed s/p Colonoscopy with Polypectomy Patient has h/o Dementia. She Golden Circle while trying to transfer herself from Wheelchair to Bed. She sustained big Bruise on her head and skin Tear on her hand. Patient is on Neuro Checks. She did not have any LOC. Patient is only c/o Headache. She does not remember falling.    Past Medical History:  Diagnosis Date  . Allergy   . Atrial fibrillation ([place]) 08/2011   First diagnosed in 08/2011; duration of arrhythmia is uncertain  . Bilateral lower extremity edema   .  Chronic diarrhea    diverticulosis  . COPD (chronic obstructive pulmonary disease) ([place])   . Dementia ([place])   . Diabetes mellitus, type 2 (HCC)    Diabetic neuropathy  . Dyspnea on exertion    pedal edema  . Gout   . Headache(784.0)    twice weekly  . Hyperlipidemia   . Hypertension   . Osteopenia    DEXA scan 01/2010  . Palpitations   . Seasonal allergies   . Stress incontinence   . Vertigo    Past Surgical History:  Procedure Laterality Date  . APPENDECTOMY    . BREAST BIOPSY  2002   Right  . CESAREAN SECTION     x 2  . CHOLECYSTECTOMY    . COLONOSCOPY  2007   Negative screening study  . COLONOSCOPY WITH PROPOFOL N/A 09/18/2017   Procedure: COLONOSCOPY WITH PROPOFOL;  Surgeon: Daneil Dolin, MD;  [facility];  Service: Endoscopy;  Laterality: N/A;  11:00am  . HAMMER TOE SURGERY     Bilateral hammer toe amputation  . INCISIONAL HERNIA REPAIR    . KNEE ARTHROSCOPY W/ MENISCAL REPAIR  2007   Bilateral  . POLYPECTOMY  09/18/2017   Procedure: POLYPECTOMY;  Surgeon: Daneil Dolin, MD;  [facility];  Service: Endoscopy;;  colon  . UMBILICAL HERNIA REPAIR      Allergies  Allergen Reactions  . Codeine Anaphylaxis and Hives  . Morphine And Related Anaphylaxis and Hives  . Penicillins Anaphylaxis  . Ace Inhibitors  Cough    Outpatient Encounter Medications as of 03/19/2018  Medication Sig  . acetaminophen (TYLENOL) 325 MG tablet Take 650 mg by mouth every 6 (six) hours as needed.   Marland Kitchen alendronate (FOSAMAX) 70 MG tablet Give 1 tablet by mouth on Sunday. Take with a full glass of water on an empty stomach.  Marland Kitchen apixaban (ELIQUIS) 5 MG TABS tablet Take 5 mg by mouth 2 (two) times daily.   . carboxymethylcellulose (REFRESH PLUS) 0.5 % SOLN Place 1 drop into both eyes 4 (four) times daily.  . cholecalciferol (VITAMIN D) 1000 UNITS tablet Take 1,000 Units by mouth daily.  . citalopram (CELEXA) 20 MG tablet Take 1 tablet (20 mg total) by mouth daily.  .  digoxin (LANOXIN) 0.125 MG tablet Take 1 tablet by mouth once a day (HOLD FOR AP UNDER 60)  . furosemide (LASIX) 20 MG tablet Take 40 mg by mouth daily.   . insulin aspart (NOVOLOG FLEXPEN) 100 UNIT/ML FlexPen Inject 12 Units into the skin 2 (two) times daily.   . Insulin Glargine (BASAGLAR KWIKPEN) 100 UNIT/ML SOPN Inject 48 Units into the skin at bedtime.   . Insulin Pen Needle (EASY TOUCH PEN NEEDLES) 31G X 8 MM MISC Use twice a day  . memantine (NAMENDA) 10 MG tablet Take 10 mg by mouth 2 (two) times daily.  . metolazone (ZAROXOLYN) 2.5 MG tablet Take 5 mg by mouth daily. Mon. Fri   . metoprolol tartrate (LOPRESSOR) 25 MG tablet Take 75 mg by mouth 2 (two) times daily.  . Multiple Vitamin (MULTIVITAMIN WITH MINERALS) TABS Take 1 tablet by mouth every morning.  Marland Kitchen omeprazole (PRILOSEC) 40 MG capsule Take 40 mg by mouth daily.  . polyethylene glycol (MIRALAX / GLYCOLAX) packet Take 17 g by mouth daily.  . potassium chloride (MICRO-K) 10 MEQ CR capsule Take 40 mEq by mouth daily. Potassium chloride extended release capsule, give 20 meq by mouth once an evening  . pravastatin (PRAVACHOL) 20 MG tablet Take 20 mg by mouth daily. Take along with 40 mg to = 60 mg  . pravastatin (PRAVACHOL) 40 MG tablet Take 40 mg by mouth daily. Take along with 20 mg to = 60 mg  . sitaGLIPtin-metformin (JANUMET) 50-500 MG tablet Take 1 tablet by mouth 2 (two) times daily with a meal.   No facility-administered encounter medications on file as of 03/19/2018.      Review of Systems  Unable to perform ROS: Dementia    Immunization History  Administered Date(s) Administered  . Influenza-Unspecified 02/04/2016, 02/03/2017  . Pneumococcal Conjugate-13 02/03/2017  . Pneumococcal-Unspecified 02/08/2016  . Tdap 01/19/2017   Pertinent  Health Maintenance Due  Topic Date Due  . HEMOGLOBIN A1C  07/24/2018  . URINE MICROALBUMIN  08/23/2018  . FOOT EXAM  10/31/2018  . OPHTHALMOLOGY EXAM  03/07/2019  . INFLUENZA  VACCINE  Completed  . DEXA SCAN  Completed  . PNA vac Low Risk Adult  Completed   Fall Risk  01/30/2018 01/19/2017 11/06/2015 09/29/2015 08/31/2015  Falls in the past year? No No Yes No No  Number falls in past yr: - - 2 or more - -  Injury with Fall? - - No - -   Functional Status Survey:    Vitals:   03/20/18 1604  BP: 110/80  Pulse: 72  Resp: 18  Temp: (!) 97.2 F (36.2 C)  SpO2: 96%   There is no height or weight on file to calculate BMI. Physical Exam  Constitutional: She appears well-developed  and well-nourished.  HENT:  Head: Normocephalic.  Bruise on her Right side of head.  Eyes: Pupils are equal, round, and reactive to light.  Neck: Neck supple.  Cardiovascular: Normal rate. An irregular rhythm present.  No murmur heard. Pulmonary/Chest: Effort normal and breath sounds normal. No respiratory distress. She has no wheezes.  Abdominal: Soft. She exhibits no distension. There is no tenderness. There is no guarding.  Musculoskeletal:  Bilateral Edema Right More then Left.With Some redness in the Posterior side of Right LE   Neurological: She is alert.  No Neuro Deficits. Her mental status is at baseline. She is alert answers appropriately. But is not oriented  Skin: Skin is warm and dry.  Has small skin tag on her Right hand and Left Leg  Psychiatric: She has a normal mood and affect. Her behavior is normal. Thought content normal.    Labs reviewed: Recent Labs    12/30/17 0415 01/02/18 0700 01/23/18 0733  NA 136 139 140  K 4.1 3.5 3.8  CL 98 98 102  CO2 30 28 29   GLUCOSE 205* 178* 194*  BUN 18 25* 21  CREATININE 0.99 1.25* 1.09*  CALCIUM 8.9 9.3 8.8*   Recent Labs    08/08/17 1459 11/08/17 1625  AST 21 25  ALT 15 19  ALKPHOS 54 52  BILITOT 0.5 0.5  PROT 7.5 7.9  ALBUMIN 3.3* 3.3*   Recent Labs    10/19/17 0729 11/08/17 1625 11/27/17 0700 12/18/17 0700  WBC 9.1 9.8 9.7 9.1  NEUTROABS 5.2 5.6 5.3  --   HGB 11.6* 11.9* 12.3 11.8*  HCT 38.3  38.2 39.6 38.1  MCV 90.8 90.5 89.0 89.6  PLT 224 248 233 217   Lab Results  Component Value Date   TSH 3.269 11/08/2017   Lab Results  Component Value Date   HGBA1C 8.7 (H) 01/23/2018   Lab Results  Component Value Date   CHOL 149 12/18/2017   HDL 31 (L) 12/18/2017   LDLCALC 87 12/18/2017   TRIG 157 (H) 12/18/2017   CHOLHDL 4.8 12/18/2017    Significant Diagnostic Results in last 30 days:  No results found.  Assessment/Plan S/p Fall Patient with h/o Dementia Golden Circle and only injury that I can see is Bruise on her head. She is on Xarelto for her Atrial Fibrillation Her BS are Stable Will continue to Neuro Checks for now. Use Tylenol PRN for Headache   Family/ staff Communication:   Labs/tests ordered:   D/W the Staff

## 2018-03-20 ENCOUNTER — Ambulatory Visit (HOSPITAL_COMMUNITY): Admit: 2018-03-20 | Payer: Medicare Other

## 2018-03-20 ENCOUNTER — Encounter: Payer: Self-pay | Admitting: Internal Medicine

## 2018-03-20 ENCOUNTER — Non-Acute Institutional Stay (SKILLED_NURSING_FACILITY): Payer: Medicare Other | Admitting: Internal Medicine

## 2018-03-20 ENCOUNTER — Ambulatory Visit (HOSPITAL_COMMUNITY)
Admission: RE | Admit: 2018-03-20 | Discharge: 2018-03-20 | Disposition: A | Discharge status: Home / Self Care | Payer: Medicare Other | Source: Ambulatory Visit | Attending: Internal Medicine | Admitting: Internal Medicine

## 2018-03-20 DIAGNOSIS — R1310 Dysphagia, unspecified: Secondary | ICD-10-CM | POA: Diagnosis not present

## 2018-03-20 DIAGNOSIS — X58XXXA Exposure to other specified factors, initial encounter: Secondary | ICD-10-CM | POA: Insufficient documentation

## 2018-03-20 DIAGNOSIS — F329 Major depressive disorder, single episode, unspecified: Secondary | ICD-10-CM | POA: Diagnosis not present

## 2018-03-20 DIAGNOSIS — I6782 Cerebral ischemia: Secondary | ICD-10-CM | POA: Diagnosis not present

## 2018-03-20 DIAGNOSIS — E1140 Type 2 diabetes mellitus with diabetic neuropathy, unspecified: Secondary | ICD-10-CM | POA: Diagnosis not present

## 2018-03-20 DIAGNOSIS — S0093XD Contusion of unspecified part of head, subsequent encounter: Secondary | ICD-10-CM

## 2018-03-20 DIAGNOSIS — F0390 Unspecified dementia without behavioral disturbance: Secondary | ICD-10-CM | POA: Diagnosis not present

## 2018-03-20 DIAGNOSIS — S0003XA Contusion of scalp, initial encounter: Secondary | ICD-10-CM | POA: Insufficient documentation

## 2018-03-20 DIAGNOSIS — G319 Degenerative disease of nervous system, unspecified: Secondary | ICD-10-CM | POA: Diagnosis not present

## 2018-03-20 DIAGNOSIS — S0083XA Contusion of other part of head, initial encounter: Secondary | ICD-10-CM | POA: Diagnosis not present

## 2018-03-20 DIAGNOSIS — S51011D Laceration without foreign body of right elbow, subsequent encounter: Secondary | ICD-10-CM

## 2018-03-20 DIAGNOSIS — S0990XA Unspecified injury of head, initial encounter: Secondary | ICD-10-CM | POA: Diagnosis not present

## 2018-03-20 DIAGNOSIS — G936 Cerebral edema: Secondary | ICD-10-CM | POA: Diagnosis not present

## 2018-03-20 LAB — POCT I-STAT CREATININE: CREATININE: 1.1 mg/dL — AB (ref 0.44–1.00)

## 2018-03-20 MED ORDER — IOHEXOL 300 MG/ML  SOLN
75.0000 mL | Freq: Once | INTRAMUSCULAR | Status: AC | PRN
  Administered 2018-03-20: 75 mL via INTRAVENOUS

## 2018-03-20 NOTE — Progress Notes (Addendum)
Location:    [place] Room Number: 113/W Place of Service:  SNF [phone]) Provider:  Veleta Miners MD  Celedonio Savage, MD  Patient Care Team: Celedonio Savage, MD as PCP - General (Family Medicine) Rothbart, Cristopher Estimable, MD (Cardiology) Lendon Colonel, NP as Nurse Practitioner (Nurse Practitioner)  Extended Emergency Contact Information Primary Emergency Contact: [facility] Address: [address]          [place], [place] [address] [name] of [place] Phone: [phone] Mobile Phone: [phone] Relation: Daughter  Code Status:  DNR Goals of care: Advanced Directive information Advanced Directives 03/20/2018  Does Patient Have a Medical Advance Directive? Yes  Type of Advance Directive Out of facility DNR (pink MOST or yellow form)  Does patient want to make changes to medical advance directive? No - Patient declined  Copy of [place] in Chart? No - copy requested  Pre-existing out of facility DNR order (yellow form or pink MOST form) -     Chief Complaint  Patient presents with  . Acute Visit    F/U of swelling of face    HPI:  Pt is a 77 y.o. female seen today for an acute visit for Worsening swelling of her face and the bruise on her Head Patient has h/o Diabetes mellitus type 2 , atrial fibrillation on chronic anticoagulation, Venous stasis, Dementia with Behavior problems, S/P fall leading to Ocular floor fracture.Recurrent Cellulitis oFRightLE.and r GI bleed s/p Colonoscopy with Polypectomy Patient has h/o Dementia. She Golden Circle yesterday  while trying to transfer herself from Wheelchair to Bed. She sustained big Bruise on her head and skin Tear on her hand. Patient is on Neuro Checks. She did not have any LOC. This morning Nurse have noticed the extension of her Bruise and swelling of her head. She continues to be alert with no changes in her Neuro exam. She is c/o Headaches.   Past Medical History:  Diagnosis Date    . Allergy   . Atrial fibrillation ([place]) 08/2011   First diagnosed in 08/2011; duration of arrhythmia is uncertain  . Bilateral lower extremity edema   . Chronic diarrhea    diverticulosis  . COPD (chronic obstructive pulmonary disease) ([place])   . Dementia ([place])   . Diabetes mellitus, type 2 (HCC)    Diabetic neuropathy  . Dyspnea on exertion    pedal edema  . Gout   . Headache(784.0)    twice weekly  . Hyperlipidemia   . Hypertension   . Osteopenia    DEXA scan 01/2010  . Palpitations   . Seasonal allergies   . Stress incontinence   . Vertigo    Past Surgical History:  Procedure Laterality Date  . APPENDECTOMY    . BREAST BIOPSY  2002   Right  . CESAREAN SECTION     x 2  . CHOLECYSTECTOMY    . COLONOSCOPY  2007   Negative screening study  . COLONOSCOPY WITH PROPOFOL N/A 09/18/2017   Procedure: COLONOSCOPY WITH PROPOFOL;  Surgeon: Daneil Dolin, MD;  [facility];  Service: Endoscopy;  Laterality: N/A;  11:00am  . HAMMER TOE SURGERY     Bilateral hammer toe amputation  . INCISIONAL HERNIA REPAIR    . KNEE ARTHROSCOPY W/ MENISCAL REPAIR  2007   Bilateral  . POLYPECTOMY  09/18/2017   Procedure: POLYPECTOMY;  Surgeon: Daneil Dolin, MD;  [facility];  Service: Endoscopy;;  colon  . UMBILICAL HERNIA REPAIR  Allergies  Allergen Reactions  . Codeine Anaphylaxis and Hives  . Morphine And Related Anaphylaxis and Hives  . Penicillins Anaphylaxis  . Ace Inhibitors Cough    Outpatient Encounter Medications as of 03/20/2018  Medication Sig  . acetaminophen (TYLENOL) 325 MG tablet Take 650 mg by mouth every 6 (six) hours as needed.   Marland Kitchen alendronate (FOSAMAX) 70 MG tablet Give 1 tablet by mouth on Sunday. Take with a full glass of water on an empty stomach.  Marland Kitchen apixaban (ELIQUIS) 5 MG TABS tablet Take 5 mg by mouth 2 (two) times daily.   . carboxymethylcellulose (REFRESH PLUS) 0.5 % SOLN Place 1 drop into both eyes 4 (four) times daily.  .  cholecalciferol (VITAMIN D) 1000 UNITS tablet Take 1,000 Units by mouth daily.  . citalopram (CELEXA) 20 MG tablet Take 1 tablet (20 mg total) by mouth daily.  . digoxin (LANOXIN) 0.125 MG tablet Take 1 tablet by mouth once a day (HOLD FOR AP UNDER 60)  . furosemide (LASIX) 20 MG tablet Take 40 mg by mouth daily.   . insulin aspart (NOVOLOG FLEXPEN) 100 UNIT/ML FlexPen Inject 12 Units into the skin 2 (two) times daily.   . Insulin Glargine (BASAGLAR KWIKPEN) 100 UNIT/ML SOPN Inject 48 Units into the skin at bedtime.   . Insulin Pen Needle (EASY TOUCH PEN NEEDLES) 31G X 8 MM MISC Use twice a day  . memantine (NAMENDA) 10 MG tablet Take 10 mg by mouth 2 (two) times daily.  . metolazone (ZAROXOLYN) 2.5 MG tablet Take 5 mg by mouth daily. Mon. Fri   . metoprolol tartrate (LOPRESSOR) 25 MG tablet Take 75 mg by mouth 2 (two) times daily.  . Multiple Vitamin (MULTIVITAMIN WITH MINERALS) TABS Take 1 tablet by mouth every morning.  Marland Kitchen omeprazole (PRILOSEC) 40 MG capsule Take 40 mg by mouth daily.  . polyethylene glycol (MIRALAX / GLYCOLAX) packet Take 17 g by mouth daily.  . potassium chloride (MICRO-K) 10 MEQ CR capsule Take 40 mEq by mouth daily. Potassium chloride extended release capsule, give 20 meq by mouth once an evening  . pravastatin (PRAVACHOL) 20 MG tablet Take 20 mg by mouth daily. Take along with 40 mg to = 60 mg  . pravastatin (PRAVACHOL) 40 MG tablet Take 40 mg by mouth daily. Take along with 20 mg to = 60 mg  . sitaGLIPtin-metformin (JANUMET) 50-500 MG tablet Take 1 tablet by mouth 2 (two) times daily with a meal.   No facility-administered encounter medications on file as of 03/20/2018.      Review of Systems  Unable to perform ROS: Dementia    Immunization History  Administered Date(s) Administered  . Influenza-Unspecified 02/04/2016, 02/03/2017  . Pneumococcal Conjugate-13 02/03/2017  . Pneumococcal-Unspecified 02/08/2016  . Tdap 01/19/2017   Pertinent  Health Maintenance  Due  Topic Date Due  . HEMOGLOBIN A1C  07/24/2018  . URINE MICROALBUMIN  08/23/2018  . FOOT EXAM  10/31/2018  . OPHTHALMOLOGY EXAM  03/07/2019  . INFLUENZA VACCINE  Completed  . DEXA SCAN  Completed  . PNA vac Low Risk Adult  Completed   Fall Risk  01/30/2018 01/19/2017 11/06/2015 09/29/2015 08/31/2015  Falls in the past year? No No Yes No No  Number falls in past yr: - - 2 or more - -  Injury with Fall? - - No - -   Functional Status Survey:    Vitals:   03/20/18 0916  BP: 124/77  Pulse: 73  Resp: 19  Temp: (!) 97.5  F (36.4 C)  TempSrc: Oral  SpO2: 96%   There is no height or weight on file to calculate BMI. Physical Exam  Constitutional: She appears well-developed and well-nourished.  HENT:  Head: Normocephalic.  Bruise on her Right side of head. And now has swelling extending to her Forehead on right side  Eyes: Pupils are equal, round, and reactive to light.  Neck: Neck supple.  Cardiovascular: Normal rate. An irregular rhythm present.  No murmur heard. Pulmonary/Chest: Effort normal and breath sounds normal. No respiratory distress. She has no wheezes.  Abdominal: Soft. She exhibits no distension. There is no tenderness. There is no guarding.  Musculoskeletal:  Bilateral Edema Right More then Left.With Some redness in the Posterior side of Right LE   Neurological: She is alert.  No Neuro Deficits. Her mental status is at baseline. She is alert answers appropriately. But is not oriented  Skin: Skin is warm and dry.  Has small skin tag on her Right hand and Left Leg  Psychiatric: She has a normal mood and affect. Her behavior is normal. Thought content normal.    Labs reviewed: Recent Labs    12/30/17 0415 01/02/18 0700 01/23/18 0733  NA 136 139 140  K 4.1 3.5 3.8  CL 98 98 102  CO2 30 28 29   GLUCOSE 205* 178* 194*  BUN 18 25* 21  CREATININE 0.99 1.25* 1.09*  CALCIUM 8.9 9.3 8.8*   Recent Labs    08/08/17 1459 11/08/17 1625  AST 21 25  ALT 15 19    ALKPHOS 54 52  BILITOT 0.5 0.5  PROT 7.5 7.9  ALBUMIN 3.3* 3.3*   Recent Labs    10/19/17 0729 11/08/17 1625 11/27/17 0700 12/18/17 0700  WBC 9.1 9.8 9.7 9.1  NEUTROABS 5.2 5.6 5.3  --   HGB 11.6* 11.9* 12.3 11.8*  HCT 38.3 38.2 39.6 38.1  MCV 90.8 90.5 89.0 89.6  PLT 224 248 233 217   Lab Results  Component Value Date   TSH 3.269 11/08/2017   Lab Results  Component Value Date   HGBA1C 8.7 (H) 01/23/2018   Lab Results  Component Value Date   CHOL 149 12/18/2017   HDL 31 (L) 12/18/2017   LDLCALC 87 12/18/2017   TRIG 157 (H) 12/18/2017   CHOLHDL 4.8 12/18/2017    Significant Diagnostic Results in last 30 days:  No results found.  Assessment/Plan  S/P Fall with now increase in the Swelling of her Head contusion  Patient is also on Eliquis for her Atrial Fibrillation Will order CT scan of her Head . Continue Neuro Checks and Tylenol PRN Skin Tears in Hand and Leg look stable Will continue to monitor.  Family/ staff Communication:   Labs/tests ordered:

## 2018-03-27 DIAGNOSIS — F329 Major depressive disorder, single episode, unspecified: Secondary | ICD-10-CM | POA: Diagnosis not present

## 2018-03-27 DIAGNOSIS — E1140 Type 2 diabetes mellitus with diabetic neuropathy, unspecified: Secondary | ICD-10-CM | POA: Diagnosis not present

## 2018-03-27 DIAGNOSIS — F0390 Unspecified dementia without behavioral disturbance: Secondary | ICD-10-CM | POA: Diagnosis not present

## 2018-03-27 DIAGNOSIS — R1310 Dysphagia, unspecified: Secondary | ICD-10-CM | POA: Diagnosis not present

## 2018-04-08 ENCOUNTER — Emergency Department (HOSPITAL_COMMUNITY): Payer: Medicare Other

## 2018-04-08 ENCOUNTER — Emergency Department (HOSPITAL_COMMUNITY)
Admission: EM | Admit: 2018-04-08 | Discharge: 2018-04-08 | Disposition: A | Discharge status: Skilled Nursing Facility | Payer: Medicare Other | Attending: Emergency Medicine | Admitting: Emergency Medicine

## 2018-04-08 ENCOUNTER — Encounter (HOSPITAL_COMMUNITY): Payer: Self-pay | Admitting: Emergency Medicine

## 2018-04-08 ENCOUNTER — Inpatient Hospital Stay
Admission: RE | Admit: 2018-04-08 | Discharge: 2020-03-30 | Disposition: A | Discharge status: Hospice | Payer: Medicare Other | Source: Ambulatory Visit | Attending: Internal Medicine | Admitting: Internal Medicine

## 2018-04-08 ENCOUNTER — Other Ambulatory Visit: Payer: Self-pay

## 2018-04-08 DIAGNOSIS — S0083XA Contusion of other part of head, initial encounter: Secondary | ICD-10-CM | POA: Insufficient documentation

## 2018-04-08 DIAGNOSIS — E1140 Type 2 diabetes mellitus with diabetic neuropathy, unspecified: Secondary | ICD-10-CM | POA: Diagnosis not present

## 2018-04-08 DIAGNOSIS — I10 Essential (primary) hypertension: Secondary | ICD-10-CM | POA: Insufficient documentation

## 2018-04-08 DIAGNOSIS — Z794 Long term (current) use of insulin: Secondary | ICD-10-CM | POA: Insufficient documentation

## 2018-04-08 DIAGNOSIS — S0990XA Unspecified injury of head, initial encounter: Secondary | ICD-10-CM | POA: Diagnosis present

## 2018-04-08 DIAGNOSIS — Y999 Unspecified external cause status: Secondary | ICD-10-CM | POA: Diagnosis not present

## 2018-04-08 DIAGNOSIS — Z7901 Long term (current) use of anticoagulants: Secondary | ICD-10-CM | POA: Insufficient documentation

## 2018-04-08 DIAGNOSIS — W06XXXA Fall from bed, initial encounter: Secondary | ICD-10-CM | POA: Insufficient documentation

## 2018-04-08 DIAGNOSIS — J449 Chronic obstructive pulmonary disease, unspecified: Secondary | ICD-10-CM | POA: Insufficient documentation

## 2018-04-08 DIAGNOSIS — R9431 Abnormal electrocardiogram [ECG] [EKG]: Secondary | ICD-10-CM | POA: Diagnosis not present

## 2018-04-08 DIAGNOSIS — F0390 Unspecified dementia without behavioral disturbance: Secondary | ICD-10-CM | POA: Diagnosis not present

## 2018-04-08 DIAGNOSIS — W19XXXA Unspecified fall, initial encounter: Secondary | ICD-10-CM

## 2018-04-08 DIAGNOSIS — Z1231 Encounter for screening mammogram for malignant neoplasm of breast: Secondary | ICD-10-CM

## 2018-04-08 DIAGNOSIS — Z79899 Other long term (current) drug therapy: Secondary | ICD-10-CM | POA: Diagnosis not present

## 2018-04-08 DIAGNOSIS — Y939 Activity, unspecified: Secondary | ICD-10-CM | POA: Diagnosis not present

## 2018-04-08 DIAGNOSIS — S199XXA Unspecified injury of neck, initial encounter: Secondary | ICD-10-CM | POA: Diagnosis not present

## 2018-04-08 DIAGNOSIS — S0003XA Contusion of scalp, initial encounter: Secondary | ICD-10-CM | POA: Diagnosis not present

## 2018-04-08 DIAGNOSIS — R7981 Abnormal blood-gas level: Principal | ICD-10-CM

## 2018-04-08 DIAGNOSIS — Y92129 Unspecified place in nursing home as the place of occurrence of the external cause: Secondary | ICD-10-CM | POA: Insufficient documentation

## 2018-04-08 NOTE — ED Notes (Signed)
EDP at bedside updating patient. 

## 2018-04-08 NOTE — Discharge Instructions (Addendum)
Take your usual prescriptions as previously directed.  You will need to keep your head elevated for the next few days, especially at night, to help decrease the swelling around your forehead area.  Apply ice several times per day for 20 minutes each time.  Call your regular medical doctor tomorrow to schedule a follow up appointment in the next 2 days.  Return to the Emergency Department immediately if worsening.

## 2018-04-08 NOTE — ED Provider Notes (Signed)
[facility] EMERGENCY DEPARTMENT Provider Note   CSN: [id number] Arrival date & time: 04/08/18  4098     History   Chief Complaint Chief Complaint  Patient presents with  . Fall    HPI Christine [name] is a 77 y.o. female.  The history is provided by the nursing home and the patient. The history is limited by the condition of the patient (Hx dementia).  Fall   Pt was seen at [place]. Per NH report and pt: Pt c/o fall out of bed approximately 0300 this morning. NH staff concerned that hematoma on forehead was "getting larger," so she was sent to the ED for further evaluation. Pt on Eliquis. Pt with significant hx of dementia and is currently acting per her baseline per NH staff. Pt denies any other complaints.     Past Medical History:  Diagnosis Date  . Allergy   . Atrial fibrillation ([place]) 08/2011   First diagnosed in 08/2011; duration of arrhythmia is uncertain  . Bilateral lower extremity edema   . Chronic diarrhea    diverticulosis  . COPD (chronic obstructive pulmonary disease) ([place])   . Dementia ([place])   . Diabetes mellitus, type 2 (HCC)    Diabetic neuropathy  . Dyspnea on exertion    pedal edema  . Gout   . Headache(784.0)    twice weekly  . Hyperlipidemia   . Hypertension   . Osteopenia    DEXA scan 01/2010  . Palpitations   . Seasonal allergies   . Stress incontinence   . Vertigo     Patient Active Problem List   Diagnosis Date Noted  . Cellulitis of right leg 11/21/2017  . Rectal bleeding 09/14/2017  . Anemia 09/14/2017  . Bilateral leg edema 01/10/2017  . Osteoporosis 08/09/2016  . Depression 11/30/2015  . Diabetic neuropathy ([place]) 09/29/2015  . Venous stasis dermatitis 05/19/2015  . Dementia with behavioral disturbance ([place]) 02/24/2015  . Chronic atrial fibrillation ([place])   . Chronic anticoagulation 09/21/2011  . Hypertension   . Hyperlipidemia   . Diabetes mellitus ([place])     Past Surgical History:  Procedure Laterality Date  .  APPENDECTOMY    . BREAST BIOPSY  2002   Right  . CESAREAN SECTION     x 2  . CHOLECYSTECTOMY    . COLONOSCOPY  2007   Negative screening study  . COLONOSCOPY WITH PROPOFOL N/A 09/18/2017   Procedure: COLONOSCOPY WITH PROPOFOL;  Surgeon: Daneil Dolin, MD;  Location: [facility]ITE;  Service: Endoscopy;  Laterality: N/A;  11:00am  . HAMMER TOE SURGERY     Bilateral hammer toe amputation  . INCISIONAL HERNIA REPAIR    . KNEE ARTHROSCOPY W/ MENISCAL REPAIR  2007   Bilateral  . POLYPECTOMY  09/18/2017   Procedure: POLYPECTOMY;  Surgeon: Daneil Dolin, MD;  Location: [facility] END[facility] SU[facility];  Service: Endoscopy;;  colon  . UMBILICAL HERNIA REPAIR       OB History    Gravida  1   Para  1   Term  1   Preterm      AB      Living        SAB      TAB      Ectopic      Multiple      Live Births               Home Medications    Prior to Admission medications   Medication Sig Start  Date End Date Taking? Authorizing Provider  acetaminophen (TYLENOL) 325 MG tablet Take 650 mg by mouth every 6 (six) hours as needed.     [provider]  alendronate (FOSAMAX) 70 MG tablet Give 1 tablet by mouth on Sunday. Take with a full glass of water on an empty stomach.    [provider]  apixaban (ELIQUIS) 5 MG TABS tablet Take 5 mg by mouth 2 (two) times daily.     [provider]  carboxymethylcellulose (REFRESH PLUS) 0.5 % SOLN Place 1 drop into both eyes 4 (four) times daily.    [provider]  cholecalciferol (VITAMIN D) 1000 UNITS tablet Take 1,000 Units by mouth daily.    [provider]  citalopram (CELEXA) 20 MG tablet Take 1 tablet (20 mg total) by mouth daily. 10/06/15   Timmothy Euler, MD  digoxin (LANOXIN) 0.125 MG tablet Take 1 tablet by mouth once a day (HOLD FOR AP UNDER 60)    [provider]  furosemide (LASIX) 20 MG tablet Take 40 mg by mouth daily.     [provider]  insulin aspart (NOVOLOG FLEXPEN) 100  UNIT/ML FlexPen Inject 12 Units into the skin 2 (two) times daily.     [provider]  Insulin Glargine (BASAGLAR KWIKPEN) 100 UNIT/ML SOPN Inject 48 Units into the skin at bedtime.     [provider]  Insulin Pen Needle (EASY TOUCH PEN NEEDLES) 31G X 8 MM MISC Use twice a day    [provider]  memantine (NAMENDA) 10 MG tablet Take 10 mg by mouth 2 (two) times daily.    [provider]  metolazone (ZAROXOLYN) 2.5 MG tablet Take 5 mg by mouth daily. Mon. Fri     [provider]  metoprolol tartrate (LOPRESSOR) 25 MG tablet Take 75 mg by mouth 2 (two) times daily.    [provider]  Multiple Vitamin (MULTIVITAMIN WITH MINERALS) TABS Take 1 tablet by mouth every morning.    [provider]  omeprazole (PRILOSEC) 40 MG capsule Take 40 mg by mouth daily.    [provider]  polyethylene glycol (MIRALAX / GLYCOLAX) packet Take 17 g by mouth daily.    [provider]  potassium chloride (MICRO-K) 10 MEQ CR capsule Take 40 mEq by mouth daily. Potassium chloride extended release capsule, give 20 meq by mouth once an evening    [provider]  pravastatin (PRAVACHOL) 20 MG tablet Take 20 mg by mouth daily. Take along with 40 mg to = 60 mg    [provider]  pravastatin (PRAVACHOL) 40 MG tablet Take 40 mg by mouth daily. Take along with 20 mg to = 60 mg    [provider]  sitaGLIPtin-metformin (JANUMET) 50-500 MG tablet Take 1 tablet by mouth 2 (two) times daily with a meal.    [provider]    Family History Family History  Adopted: Yes    Social History Social History   Tobacco Use  . Smoking status: Never Smoker  . Smokeless tobacco: Never Used  Substance Use Topics  . Alcohol use: No  . Drug use: No     Allergies   Codeine; Morphine and related; Penicillins; and Ace inhibitors   Review of Systems Review of Systems  Unable to perform ROS: Dementia      Physical Exam Updated Vital Signs BP 106/72 (BP Location: Right Arm)   Pulse 74   Temp 98 F (36.7 C) (Oral)  Resp 15   Ht 5\' 2"  (1.575 m)   Wt 90 kg   SpO2 92%   BMI 36.29 kg/m   Physical Exam 0850: Physical examination: Vital signs and O2 SAT: Reviewed; Constitutional: Well developed, Well nourished, Well hydrated, In no acute distress; Head and Face: Normocephalic, +hematoma upper mid-forehead near hairline. No open wounds..; Eyes: EOMI, PERRL, No scleral icterus; ENMT: Mouth and pharynx normal, Mucous membranes moist; Neck: Supple, Trachea midline. No abrasions or ecchymosis.; Spine:  No midline CS, TS, LS tenderness.; Cardiovascular: Regular rate and rhythm, No gallop; Respiratory: Breath sounds clear & equal bilaterally, No wheezes, Normal respiratory effort/excursion; Chest: Nontender, No deformity, Movement normal.; Abdomen: Soft, Nontender, Nondistended, Normal bowel sounds.; Genitourinary: No CVA tenderness;; Extremities: Full range of motion major/large joints of bilat UE's and LE's without pain or tenderness to palp, Neurovascularly intact, Pulses normal, No deformity. No tenderness, +2 pedal edema bilat. Pelvis stable; Neuro: Awake, alert, confused per hx dementia. No facial droop. Major CN grossly intact. Speech clear. Grips equal. Strength 5/5 equal bilat UE's and LE's. No apparent gross focal motor deficits in extremities.; Skin: Color normal, Warm, Dry   ED Treatments / Results  Labs (all labs ordered are listed, but only abnormal results are displayed)   EKG None  Radiology   Procedures Procedures (including critical care time)  Medications Ordered in ED Medications - No data to display   Initial Impression / Assessment and Plan / ED Course  I have reviewed the triage vital signs and the nursing notes.  Pertinent labs & imaging results that were available during my care of the patient were reviewed by me and considered in my medical decision making  (see chart for details).  MDM Reviewed: previous chart, nursing note and vitals Interpretation: CT scan   Ct Head Wo Contrast Result Date: 04/08/2018 CLINICAL DATA:  Fall with forehead hematoma.  History of dementia. EXAM: CT HEAD WITHOUT CONTRAST CT CERVICAL SPINE WITHOUT CONTRAST TECHNIQUE: Multidetector CT imaging of the head and cervical spine was performed following the standard protocol without intravenous contrast. Multiplanar CT image reconstructions of the cervical spine were also generated. COMPARISON:  03/20/2018 head CT.  01/14/2016 CT cervical spine. FINDINGS: CT HEAD FINDINGS Brain: Stable atrophy and small vessel disease. No evidence of intracranial hemorrhage, acute infarct, mass effect or hydrocephalus. Vascular: No hyperdense vessel or unexpected calcification. Skull: Normal. Negative for fracture or focal lesion. Sinuses/Orbits: No acute finding. Other: Prominent midline frontal scalp hematoma identified. The previous right frontal scalp hematoma is smaller in size and resolving. CT CERVICAL SPINE FINDINGS Alignment: Stable alignment without subluxation. Skull base and vertebrae: No acute fractures identified. No bony lesions. Soft tissues and spinal canal: No acute soft tissue swelling. Disc levels: Advanced degenerative disc disease again identified with slight progression since 2017. Severe disc space narrowing at C3-4. Moderately severe degenerative disc disease also present at C5-6, C6-7 and C7-T1. Upper chest: Negative. IMPRESSION: 1. New prominent midline frontal scalp hematoma without underlying skull fracture or intracranial hemorrhage. The recent right frontal scalp hematoma is smaller in size and resolving. 2. No evidence of acute cervical spine injury. There is some progression of moderately severe multilevel cervical disc disease since the prior cervical CT in 2017. Electronically Signed   By: Aletta Edouard M.D.   On: 04/08/2018 10:15    Ct Cervical Spine Wo  Contrast Result Date: 04/08/2018 CLINICAL DATA:  Fall with forehead hematoma.  History of dementia. EXAM: CT HEAD WITHOUT CONTRAST CT CERVICAL SPINE WITHOUT CONTRAST TECHNIQUE:  Multidetector CT imaging of the head and cervical spine was performed following the standard protocol without intravenous contrast. Multiplanar CT image reconstructions of the cervical spine were also generated. COMPARISON:  03/20/2018 head CT.  01/14/2016 CT cervical spine. FINDINGS: CT HEAD FINDINGS Brain: Stable atrophy and small vessel disease. No evidence of intracranial hemorrhage, acute infarct, mass effect or hydrocephalus. Vascular: No hyperdense vessel or unexpected calcification. Skull: Normal. Negative for fracture or focal lesion. Sinuses/Orbits: No acute finding. Other: Prominent midline frontal scalp hematoma identified. The previous right frontal scalp hematoma is smaller in size and resolving. CT CERVICAL SPINE FINDINGS Alignment: Stable alignment without subluxation. Skull base and vertebrae: No acute fractures identified. No bony lesions. Soft tissues and spinal canal: No acute soft tissue swelling. Disc levels: Advanced degenerative disc disease again identified with slight progression since 2017. Severe disc space narrowing at C3-4. Moderately severe degenerative disc disease also present at C5-6, C6-7 and C7-T1. Upper chest: Negative. IMPRESSION: 1. New prominent midline frontal scalp hematoma without underlying skull fracture or intracranial hemorrhage. The recent right frontal scalp hematoma is smaller in size and resolving. 2. No evidence of acute cervical spine injury. There is some progression of moderately severe multilevel cervical disc disease since the prior cervical CT in 2017. Electronically Signed   By: Aletta Edouard M.D.   On: 04/08/2018 10:15    1025:  CT reassuring. BP within baseline range on file. Pt remains per baseline, NAD, no change in assessment. Will d/c back to NH stable.     Final  Clinical Impressions(s) / ED Diagnoses   Final diagnoses:  None    ED Discharge Orders    None       Francine Graven, DO 04/13/18 1343

## 2018-04-08 NOTE — ED Triage Notes (Signed)
Pt from [facility]. Facility reports fall around 0300 this morning. She has a hematoma on forehead that is getting larger. Pt also c/o HA. Pt on Eliquis. Pt is confused at baseline. Denies any neuro changes. Denies LOC.

## 2018-04-08 NOTE — ED Notes (Signed)
[facility] arrived to transport pt back to facility.

## 2018-04-08 NOTE — ED Notes (Signed)
Patient transported to CT 

## 2018-04-09 ENCOUNTER — Encounter: Payer: Self-pay | Admitting: Internal Medicine

## 2018-04-09 ENCOUNTER — Non-Acute Institutional Stay (SKILLED_NURSING_FACILITY): Payer: Medicare Other | Admitting: Internal Medicine

## 2018-04-09 DIAGNOSIS — S0083XD Contusion of other part of head, subsequent encounter: Secondary | ICD-10-CM | POA: Diagnosis not present

## 2018-04-09 DIAGNOSIS — E1140 Type 2 diabetes mellitus with diabetic neuropathy, unspecified: Secondary | ICD-10-CM

## 2018-04-09 DIAGNOSIS — I482 Chronic atrial fibrillation, unspecified: Secondary | ICD-10-CM

## 2018-04-09 DIAGNOSIS — I10 Essential (primary) hypertension: Secondary | ICD-10-CM

## 2018-04-09 DIAGNOSIS — Z794 Long term (current) use of insulin: Secondary | ICD-10-CM

## 2018-04-09 NOTE — Progress Notes (Signed)
Location:    [facility] Room Number: 113/W Place of Service:  SNF [phone]) Provider:  Veleta Miners MD  Celedonio Savage, MD  Patient Care Team: Celedonio Savage, MD as PCP - General (Family Medicine) Rothbart, Cristopher Estimable, MD (Cardiology) Lendon Colonel, NP as Nurse Practitioner (Nurse Practitioner)  Extended Emergency Contact Information Primary Emergency Contact: [facility] Address: [address]          [place], [place] [address] [name] of [place] Phone: [phone] Mobile Phone: [phone] Relation: Daughter  Code Status:  DNR Goals of care: Advanced Directive information Advanced Directives 04/09/2018  Does Patient Have a Medical Advance Directive? Yes  Type of Advance Directive Out of facility DNR (pink MOST or yellow form)  Does patient want to make changes to medical advance directive? No - Patient declined  Copy of [place] in Chart? No - copy requested  Pre-existing out of facility DNR order (yellow form or pink MOST form) -     Chief Complaint  Patient presents with  . Acute Visit    F/U ER Visit form fall bruise to forehead    HPI:  Pt is a 77 y.o. female seen today for an acute visit for  Follow up from ED.  Patient has h/o Diabetes mellitus type 2 , atrial fibrillation on chronic anticoagulation, Venous stasis, Dementia with Behavior problems, Recurrent Cellulitis oFRightLE.and r GI bleed s/p Colonoscopy with Polypectomy and Recurrent Falls  She was send to ED after her Fall in the facility when she was trying to get up at night to go to the [place]. She sustained a bruise on top of her Head . She had CT scan of her head which did not show any acute Process beside the hematoma. She is now back in facility. She says her Head is feeling better. This is her second fall in past few weeks. She also had tongue bite in her fall. She did not have any other Complains.     Past Medical History:  Diagnosis Date    . Allergy   . Atrial fibrillation ([place]) 08/2011   First diagnosed in 08/2011; duration of arrhythmia is uncertain  . Bilateral lower extremity edema   . Chronic diarrhea    diverticulosis  . COPD (chronic obstructive pulmonary disease) ([place])   . Dementia ([place])   . Diabetes mellitus, type 2 ([facility])    Diabetic neuropathy  . Dyspnea on exertion    pedal edema  . Gout   . Headache(784.0)    twice weekly  . Hyperlipidemia   . Hypertension   . Osteopenia    DEXA scan 01/2010  . Palpitations   . Seasonal allergies   . Stress incontinence   . Vertigo    Past Surgical History:  Procedure Laterality Date  . APPENDECTOMY    . BREAST BIOPSY  2002   Right  . CESAREAN SECTION     x 2  . CHOLECYSTECTOMY    . COLONOSCOPY  2007   Negative screening study  . COLONOSCOPY WITH PROPOFOL N/A 09/18/2017   Procedure: COLONOSCOPY WITH PROPOFOL;  Surgeon: Daneil Dolin, MD;  Location: [facility];  Service: Endoscopy;  Laterality: N/A;  11:00am  . HAMMER TOE SURGERY     Bilateral hammer toe amputation  . INCISIONAL HERNIA REPAIR    . KNEE ARTHROSCOPY W/ MENISCAL REPAIR  2007   Bilateral  . POLYPECTOMY  09/18/2017   Procedure: POLYPECTOMY;  Surgeon: Daneil Dolin, MD;  Location: [facility];  Service: Endoscopy;;  colon  . UMBILICAL HERNIA REPAIR      Allergies  Allergen Reactions  . Codeine Anaphylaxis and Hives  . Morphine And Related Anaphylaxis and Hives  . Penicillins Anaphylaxis  . Ace Inhibitors Cough    Outpatient Encounter Medications as of 04/09/2018  Medication Sig  . acetaminophen (TYLENOL) 325 MG tablet Take 650 mg by mouth every 6 (six) hours as needed.   Marland Kitchen alendronate (FOSAMAX) 70 MG tablet Give 1 tablet by mouth on Sunday. Take with a full glass of water on an empty stomach.  Marland Kitchen apixaban (ELIQUIS) 5 MG TABS tablet Take 5 mg by mouth 2 (two) times daily.   . carboxymethylcellulose (REFRESH PLUS) 0.5 % SOLN Place 1 drop into both eyes 4 (four) times daily.  .  cholecalciferol (VITAMIN D) 1000 UNITS tablet Take 1,000 Units by mouth daily.  . citalopram (CELEXA) 20 MG tablet Take 1 tablet (20 mg total) by mouth daily.  . digoxin (LANOXIN) 0.125 MG tablet Take 1 tablet by mouth once a day (HOLD FOR AP UNDER 60)  . furosemide (LASIX) 20 MG tablet Take 40 mg by mouth daily.   . insulin aspart (NOVOLOG FLEXPEN) 100 UNIT/ML FlexPen Inject 12-14 Units into the skin 2 (two) times daily. Per sliding scale  . Insulin Glargine (BASAGLAR KWIKPEN) 100 UNIT/ML SOPN Inject 48 Units into the skin at bedtime.   . Insulin Pen Needle (EASY TOUCH PEN NEEDLES) 31G X 8 MM MISC Use twice a day  . memantine (NAMENDA) 10 MG tablet Take 10 mg by mouth 2 (two) times daily.  . metolazone (ZAROXOLYN) 2.5 MG tablet Take 5 mg by mouth daily. Mon. Fri   . metoprolol tartrate (LOPRESSOR) 25 MG tablet Take 75 mg by mouth 2 (two) times daily.  . Multiple Vitamin (MULTIVITAMIN WITH MINERALS) TABS Take 1 tablet by mouth every morning.  Marland Kitchen omeprazole (PRILOSEC) 40 MG capsule Take 40 mg by mouth daily.  . polyethylene glycol (MIRALAX / GLYCOLAX) packet Take 17 g by mouth daily.  . potassium chloride (MICRO-K) 10 MEQ CR capsule Take 40 mEq by mouth daily. Potassium chloride extended release capsule, give 20 meq by mouth once an evening  . pravastatin (PRAVACHOL) 20 MG tablet Take 20 mg by mouth daily. Take along with 40 mg to = 60 mg  . pravastatin (PRAVACHOL) 40 MG tablet Take 40 mg by mouth daily. Take along with 20 mg to = 60 mg  . sitaGLIPtin-metformin (JANUMET) 50-500 MG tablet Take 1 tablet by mouth 2 (two) times daily with a meal.   No facility-administered encounter medications on file as of 04/09/2018.      Review of Systems  Unable to perform ROS: Dementia    Immunization History  Administered Date(s) Administered  . Influenza-Unspecified 02/04/2016, 02/03/2017  . Pneumococcal Conjugate-13 02/03/2017  . Pneumococcal-Unspecified 02/08/2016  . Tdap 01/19/2017   Pertinent   Health Maintenance Due  Topic Date Due  . HEMOGLOBIN A1C  07/24/2018  . URINE MICROALBUMIN  08/23/2018  . FOOT EXAM  10/31/2018  . OPHTHALMOLOGY EXAM  03/07/2019  . INFLUENZA VACCINE  Completed  . DEXA SCAN  Completed  . PNA vac Low Risk Adult  Completed   Fall Risk  01/30/2018 01/19/2017 11/06/2015 09/29/2015 08/31/2015  Falls in the past year? No No Yes No No  Number falls in past yr: - - 2 or more - -  Injury with Fall? - - No - -   Functional Status Survey:  Vitals:   04/09/18 1354  BP: (!) 105/58  Pulse: 75  Resp: 18  Temp: (!) 97.4 F (36.3 C)  TempSrc: Oral  SpO2: 98%   There is no height or weight on file to calculate BMI. Physical Exam  Constitutional: She appears well-developed and well-nourished.  HENT:  Head: Normocephalic.  Old Bruise on her Right side of head. Now has another Swelling in her Forehead  Also Has tongue Bite  Eyes: Pupils are equal, round, and reactive to light.  Neck: Neck supple.  Cardiovascular: Normal rate. An irregular rhythm present.  No murmur heard. Pulmonary/Chest: Effort normal and breath sounds normal. No respiratory distress. She has no wheezes.  Abdominal: Soft. She exhibits no distension. There is no tenderness. There is no guarding.  Musculoskeletal:  Bilateral Edema Right More then Left.With Some redness in the Posterior side of Right LE   Neurological: She is alert.  No Neuro Deficits. Her mental status is at baseline. She is alert answers appropriately. But is not oriented  Skin: Skin is warm and dry.  Has small skin tag on her Right hand and Left Leg  Psychiatric: She has a normal mood and affect. Her behavior is normal. Thought content normal.    Labs reviewed: Recent Labs    12/30/17 0415 01/02/18 0700 01/23/18 0733 03/20/18 1740  NA 136 139 140  --   K 4.1 3.5 3.8  --   CL 98 98 102  --   CO2 30 28 29   --   GLUCOSE 205* 178* 194*  --   BUN 18 25* 21  --   CREATININE 0.99 1.25* 1.09* 1.10*  CALCIUM 8.9 9.3  8.8*  --    Recent Labs    08/08/17 1459 11/08/17 1625  AST 21 25  ALT 15 19  ALKPHOS 54 52  BILITOT 0.5 0.5  PROT 7.5 7.9  ALBUMIN 3.3* 3.3*   Recent Labs    10/19/17 0729 11/08/17 1625 11/27/17 0700 12/18/17 0700  WBC 9.1 9.8 9.7 9.1  NEUTROABS 5.2 5.6 5.3  --   HGB 11.6* 11.9* 12.3 11.8*  HCT 38.3 38.2 39.6 38.1  MCV 90.8 90.5 89.0 89.6  PLT 224 248 233 217   Lab Results  Component Value Date   TSH 3.269 11/08/2017   Lab Results  Component Value Date   HGBA1C 8.7 (H) 01/23/2018   Lab Results  Component Value Date   CHOL 149 12/18/2017   HDL 31 (L) 12/18/2017   LDLCALC 87 12/18/2017   TRIG 157 (H) 12/18/2017   CHOLHDL 4.8 12/18/2017    Significant Diagnostic Results in last 30 days:  Ct Head Wo Contrast  Result Date: 04/08/2018 CLINICAL DATA:  Fall with forehead hematoma.  History of dementia. EXAM: CT HEAD WITHOUT CONTRAST CT CERVICAL SPINE WITHOUT CONTRAST TECHNIQUE: Multidetector CT imaging of the head and cervical spine was performed following the standard protocol without intravenous contrast. Multiplanar CT image reconstructions of the cervical spine were also generated. COMPARISON:  03/20/2018 head CT.  01/14/2016 CT cervical spine. FINDINGS: CT HEAD FINDINGS Brain: Stable atrophy and small vessel disease. No evidence of intracranial hemorrhage, acute infarct, mass effect or hydrocephalus. Vascular: No hyperdense vessel or unexpected calcification. Skull: Normal. Negative for fracture or focal lesion. Sinuses/Orbits: No acute finding. Other: Prominent midline frontal scalp hematoma identified. The previous right frontal scalp hematoma is smaller in size and resolving. CT CERVICAL SPINE FINDINGS Alignment: Stable alignment without subluxation. Skull base and vertebrae: No acute fractures identified. No bony lesions.  Soft tissues and spinal canal: No acute soft tissue swelling. Disc levels: Advanced degenerative disc disease again identified with slight  progression since 2017. Severe disc space narrowing at C3-4. Moderately severe degenerative disc disease also present at C5-6, C6-7 and C7-T1. Upper chest: Negative. IMPRESSION: 1. New prominent midline frontal scalp hematoma without underlying skull fracture or intracranial hemorrhage. The recent right frontal scalp hematoma is smaller in size and resolving. 2. No evidence of acute cervical spine injury. There is some progression of moderately severe multilevel cervical disc disease since the prior cervical CT in 2017. Electronically Signed   By: Aletta Edouard M.D.   On: 04/08/2018 10:15   Ct Head W & Wo Contrast  Result Date: 03/20/2018 CLINICAL DATA:  [place] transferring from wheelchair. Headache and forehead bruising. On anticoagulation. History of diabetes, atrial fibrillation and dementia. EXAM: CT HEAD WITHOUT AND WITH CONTRAST TECHNIQUE: Contiguous axial images were obtained from the base of the skull through the vertex without and with intravenous contrast CONTRAST:  75mL OMNIPAQUE IOHEXOL 300 MG/ML  SOLN COMPARISON:  CT HEAD January 14, 2016 FINDINGS: BRAIN: No intraparenchymal hemorrhage, mass effect nor midline shift. Advanced mesial temporal lobe atrophy with ex vacuo dilatation temporal horns of lateral ventricles. No hydrocephalus. Patchy supratentorial white matter hypodensities less than expected for patient's age, though non-specific are most compatible with chronic small vessel ischemic disease. No abnormal intracranial enhancement. No acute large vascular territory infarcts. No abnormal extra-axial fluid collections. Basal cisterns are patent. VASCULAR: Moderate calcific atherosclerosis of the carotid siphons. SKULL: No skull fracture. Large RIGHT frontal scalp hematoma. Mild RIGHT facial/periorbital soft tissue swelling with subcutaneous fat stranding. SINUSES/ORBITS: Trace paranasal sinus mucosal thickening. Mastoid air cells are well aerated.The included ocular globes and orbital  contents are non-suspicious. Status post bilateral ocular lens implants. OTHER: None. IMPRESSION: 1. No acute intracranial process. RIGHT frontal scalp hematoma. RIGHT periorbital/facial contusion without postseptal involvement. 2. Advanced mesial temporal lobe atrophy associated with neuro degenerative syndromes. 3. Mild chronic small vessel ischemic changes. 4. These results will be called to the ordering clinician or representative by the Radiology Department at the imaging location. Electronically Signed   By: Elon Alas M.D.   On: 03/20/2018 18:31   Ct Cervical Spine Wo Contrast  Result Date: 04/08/2018 CLINICAL DATA:  Fall with forehead hematoma.  History of dementia. EXAM: CT HEAD WITHOUT CONTRAST CT CERVICAL SPINE WITHOUT CONTRAST TECHNIQUE: Multidetector CT imaging of the head and cervical spine was performed following the standard protocol without intravenous contrast. Multiplanar CT image reconstructions of the cervical spine were also generated. COMPARISON:  03/20/2018 head CT.  01/14/2016 CT cervical spine. FINDINGS: CT HEAD FINDINGS Brain: Stable atrophy and small vessel disease. No evidence of intracranial hemorrhage, acute infarct, mass effect or hydrocephalus. Vascular: No hyperdense vessel or unexpected calcification. Skull: Normal. Negative for fracture or focal lesion. Sinuses/Orbits: No acute finding. Other: Prominent midline frontal scalp hematoma identified. The previous right frontal scalp hematoma is smaller in size and resolving. CT CERVICAL SPINE FINDINGS Alignment: Stable alignment without subluxation. Skull base and vertebrae: No acute fractures identified. No bony lesions. Soft tissues and spinal canal: No acute soft tissue swelling. Disc levels: Advanced degenerative disc disease again identified with slight progression since 2017. Severe disc space narrowing at C3-4. Moderately severe degenerative disc disease also present at C5-6, C6-7 and C7-T1. Upper chest: Negative.  IMPRESSION: 1. New prominent midline frontal scalp hematoma without underlying skull fracture or intracranial hemorrhage. The recent right frontal scalp hematoma is smaller in size  and resolving. 2. No evidence of acute cervical spine injury. There is some progression of moderately severe multilevel cervical disc disease since the prior cervical CT in 2017. Electronically Signed   By: Aletta Edouard M.D.   On: 04/08/2018 10:15    Assessment/Plan  Recurrent Falls with Head contusions Head CT scan in ED was negative D/W the Nurses Patient needs to be taken to Bathroom more frequently to avoid her getting up by herself. Also Reinforced with patient to call for help Have to reconsider her Eliquis if she keeps having falls.   Essential hypertension Patient's blood pressure stable on Lasix,lopressor, metolazone She was taken off Lotensin due to low blood pressure  Chronic A. fib Rate control on Lopressor and digoxin Dig level was normal in09/19 On Eliquis for Anticoagulation  Type 2 diabetes with diabetes neuropathy A1c was elevated at 8.7 in 09/19 Since then her Bolus Novolog has been incrased Blood sugarssome peak at around 300 in evening But she has had hypoglycemia in Mornings on Increased dose She is also on Januvia Diastolic CHF with chronic lower extremity edema Patient on Lasix 40 mgand Metolazone Continue compression stockings Renal Function Stable Age-related osteoporosis Stable on Fosamax Hyperlipidemia LDL87on statin  Depression with dementia On Celexa and Namenda Is very stable H/O GI bleed Patient is s/p Colonoscopy and Polypectomy. Hgb stable  Family/ staff Communication:   Labs/tests ordered:

## 2018-04-24 ENCOUNTER — Encounter: Payer: Self-pay | Admitting: Internal Medicine

## 2018-04-24 ENCOUNTER — Non-Acute Institutional Stay (SKILLED_NURSING_FACILITY): Payer: Medicare Other | Admitting: Internal Medicine

## 2018-04-24 DIAGNOSIS — M810 Age-related osteoporosis without current pathological fracture: Secondary | ICD-10-CM | POA: Diagnosis not present

## 2018-04-24 DIAGNOSIS — I482 Chronic atrial fibrillation, unspecified: Secondary | ICD-10-CM | POA: Diagnosis not present

## 2018-04-24 DIAGNOSIS — I5032 Chronic diastolic (congestive) heart failure: Secondary | ICD-10-CM | POA: Diagnosis not present

## 2018-04-24 DIAGNOSIS — I10 Essential (primary) hypertension: Secondary | ICD-10-CM

## 2018-04-24 DIAGNOSIS — Z794 Long term (current) use of insulin: Secondary | ICD-10-CM | POA: Diagnosis not present

## 2018-04-24 DIAGNOSIS — E1140 Type 2 diabetes mellitus with diabetic neuropathy, unspecified: Secondary | ICD-10-CM | POA: Diagnosis not present

## 2018-04-24 DIAGNOSIS — F0391 Unspecified dementia with behavioral disturbance: Secondary | ICD-10-CM

## 2018-04-24 NOTE — Progress Notes (Signed)
Location:    [facility] Room Number: 113/W Place of Service:  SNF [phone]) Provider: Lajean Silvius, MD  Patient Care Team: Celedonio Savage, MD as PCP - General (Family Medicine) Rothbart, Cristopher Estimable, MD (Cardiology) Lendon Colonel, NP as Nurse Practitioner (Nurse Practitioner)  Extended Emergency Contact Information Primary Emergency Contact: [facility] Address: [address]          [place], [place] [address] [name] of [place] Phone: [phone] Mobile Phone: [phone] Relation: Daughter  Code Status: DNR Goals of care: Advanced Directive information Advanced Directives 04/24/2018  Does Patient Have a Medical Advance Directive? Yes  Type of Advance Directive Out of facility DNR (pink MOST or yellow form)  Does patient want to make changes to medical advance directive? No - Patient declined  Copy of [place] in Chart? No - copy requested  Pre-existing out of facility DNR order (yellow form or pink MOST form) -     Chief Complaint  Patient presents with  . Medical Management of Chronic Issues    Routine visit of medical management  Medical management of chronic diseases including history of dementia with behaviors as well as type 2 diabetes-atrial fibrillation lower extremity edema with diastolic CHF- history of GI bleed with history of colonoscopy with polypectomy in the past- as well as hypertension and osteoporosis in addition to hyperlipidemia.    HPI:  Pt is a 77 y.o. female seen today for medical management of chronic diseases.  As noted above.  Most recent acute issue was a fall which resulted in a bruise to her forehead and an ER visit.  Apparently she was trying to get up at night to go to the bathroom and sustained a bruise on top of her head- CT scan of the head in the ER did not show any acute process.  She also apparently bit her tongue.  She has been cautioned try to use the call  light when she goes to the bathroom he did readdress that today- however she does have a history of dementia which complicates matters.  .  Her other medical issues appear to be stable--she does have a history of dementia with behaviors but this appears to be well controlled she is on Namenda as well as Celexa she has lost a small amount of weight but I suspect this is desired.  Regards to type 2 diabetes she is on Basaglar insulin 48 units at bedtime in addition to Janumet and bolus NovoLog morning blood sugars appear to be largely in the mid 100s later in the day readings appear to be more in the higher 100s to lmid200s with some lower readings more in the mid 100s to mid 200s at at bedtime  Hemoglobin A1c back in September was 8.7 her neuro bolus insulin has since been increased-there is concern with hypoglycemia in the mornings the past so we have been somewhat conservative here will update a hemoglobin A1c   regards atrial fibrillation this appears controlled on Lopressor digoxin regards to diastolic CHF she is on Lasix 40 mg a day with potassium supplementation she is also on metolazone 5 mg 2 days a week edema appears to be stabilized she is actually lost a small amount of weight this could be fluid related.  In regards to history of GI bleed she is status post colonoscopy with polypectomy her hemoglobin has shown stability was recently was 11.8 on lab done in August will update this as  well.  Regards to hypertension she is on Lasix Lopressor as well as metolazone she is no longer on Lotensin because of tensive readings- blood pressures appear to be stable largely in the lower 762G systolically.  She also has a history of osteoporosis and continues on Fosamax.  In regards to hyperlipidemia she is on Pravachol LDL was 87 on recent lab.  Currently she is sitting in her wheelchair comfortably the bruising appears to have largely healed she appears to be bright alert somewhat confused but  pleasantly so- she did state nursing staff has been quite adamant about her not getting out of her wheelchair and trying to walk without assistance-.      Past Medical History:  Diagnosis Date  . Allergy   . Atrial fibrillation ([place]) 08/2011   First diagnosed in 08/2011; duration of arrhythmia is uncertain  . Bilateral lower extremity edema   . Chronic diarrhea    diverticulosis  . COPD (chronic obstructive pulmonary disease) ([place])   . Dementia ([place])   . Diabetes mellitus, type 2 (H[facility])    Diabetic neuropathy  . Dyspnea on exertion    pedal edema  . Gout   . Headache(784.0)    twice weekly  . Hyperlipidemia   . Hypertension   . Osteopenia    DEXA scan 01/2010  . Palpitations   . Seasonal allergies   . Stress incontinence   . Vertigo    Past Surgical History:  Procedure Laterality Date  . APPENDECTOMY    . BREAST BIOPSY  2002   Right  . CESAREAN SECTION     x 2  . CHOLECYSTECTOMY    . COLONOSCOPY  2007   Negative screening study  . COLONOSCOPY WITH PROPOFOL N/A 09/18/2017   Procedure: COLONOSCOPY WITH PROPOFOL;  Surgeon: Daneil Dolin, MD;  Location: [facility];  Service: Endoscopy;  Laterality: N/A;  11:00am  . HAMMER TOE SURGERY     Bilateral hammer toe amputation  . INCISIONAL HERNIA REPAIR    . KNEE ARTHROSCOPY W/ MENISCAL REPAIR  2007   Bilateral  . POLYPECTOMY  09/18/2017   Procedure: POLYPECTOMY;  Surgeon: Daneil Dolin, MD;  Location: [facility];  Service: Endoscopy;;  colon  . UMBILICAL HERNIA REPAIR      Allergies  Allergen Reactions  . Codeine Anaphylaxis and Hives  . Morphine And Related Anaphylaxis and Hives  . Penicillins Anaphylaxis  . Ace Inhibitors Cough  . Morphine   . Penicillin G     Outpatient Encounter Medications as of 04/24/2018  Medication Sig  . acetaminophen (TYLENOL) 325 MG tablet Take 650 mg by mouth every 6 (six) hours as needed.   Marland Kitchen alendronate (FOSAMAX) 70 MG tablet Give 1 tablet by mouth on Sunday. Take with a  full glass of water on an empty stomach.  Marland Kitchen apixaban (ELIQUIS) 5 MG TABS tablet Take 5 mg by mouth 2 (two) times daily.   . carboxymethylcellulose (REFRESH PLUS) 0.5 % SOLN Place 1 drop into both eyes 4 (four) times daily.  . cholecalciferol (VITAMIN D) 1000 UNITS tablet Take 1,000 Units by mouth daily.  . citalopram (CELEXA) 20 MG tablet Take 1 tablet (20 mg total) by mouth daily.  . digoxin (LANOXIN) 0.125 MG tablet Take 1 tablet by mouth once a day (HOLD FOR AP UNDER 60)  . furosemide (LASIX) 20 MG tablet Take 40 mg by mouth daily.   . insulin aspart (NOVOLOG FLEXPEN) 100 UNIT/ML FlexPen Inject 12-14 Units into the skin 2 (  two) times daily. Per sliding scale  . Insulin Glargine (BASAGLAR KWIKPEN) 100 UNIT/ML SOPN Inject 48 Units into the skin at bedtime.   . Insulin Pen Needle (EASY TOUCH PEN NEEDLES) 31G X 8 MM MISC Use twice a day  . memantine (NAMENDA) 10 MG tablet Take 10 mg by mouth 2 (two) times daily.  . metolazone (ZAROXOLYN) 2.5 MG tablet Take 5 mg by mouth daily. Mon. Fri   . metoprolol tartrate (LOPRESSOR) 25 MG tablet Take 75 mg by mouth 2 (two) times daily.  . Multiple Vitamin (MULTIVITAMIN WITH MINERALS) TABS Take 1 tablet by mouth every morning.  Marland Kitchen omeprazole (PRILOSEC) 40 MG capsule Take 40 mg by mouth daily.  . polyethylene glycol (MIRALAX / GLYCOLAX) packet Take 17 g by mouth daily.  . potassium chloride (MICRO-K) 10 MEQ CR capsule Take 40 mEq by mouth daily. Potassium chloride extended release capsule, give 20 meq by mouth once an evening  . pravastatin (PRAVACHOL) 20 MG tablet Take 20 mg by mouth daily. Take along with 40 mg to = 60 mg  . pravastatin (PRAVACHOL) 40 MG tablet Take 40 mg by mouth daily. Take along with 20 mg to = 60 mg  . sitaGLIPtin-metformin (JANUMET) 50-500 MG tablet Take 1 tablet by mouth 2 (two) times daily with a meal.   No facility-administered encounter medications on file as of 04/24/2018.      Review of Systems   This is limited secondary to  dementia but she is denying any pain at this time no shortness of breath chest discomfort or complaints today-  Immunization History  Administered Date(s) Administered  . Influenza-Unspecified 02/04/2016, 02/03/2017  . Pneumococcal Conjugate-13 02/03/2017  . Pneumococcal-Unspecified 02/08/2016  . Tdap 01/19/2017   Pertinent  Health Maintenance Due  Topic Date Due  . HEMOGLOBIN A1C  07/24/2018  . URINE MICROALBUMIN  08/23/2018  . FOOT EXAM  10/31/2018  . OPHTHALMOLOGY EXAM  03/07/2019  . INFLUENZA VACCINE  Completed  . DEXA SCAN  Completed  . PNA vac Low Risk Adult  Completed   Fall Risk  01/30/2018 01/19/2017 11/06/2015 09/29/2015 08/31/2015  Falls in the past year? No No Yes No No  Number falls in past yr: - - 2 or more - -  Injury with Fall? - - No - -   Functional Status Survey:    Vitals:   04/24/18 1454  BP: 113/64  Pulse: 74  Resp: 16  Temp: 98.6 F (37 C)  TempSrc: Oral  SpO2: 98%  Weight: 196 lb (88.9 kg)  Height: 5\' 6"  (1.676 m)   Body mass index is 31.64 kg/m. Physical Exam  In general this a very pleasant somewhat confused elderly female in no distress sitting comfortably in a wheelchair.  Skin is warm and dry.  The bruising of her face has largely resolved there is just minimal residual bruising.  Eyes visual acuity appears to be intact sclera and conjunctive are clear.  Oropharynx is clear mucous membranes moist.  Cannot really appreciate any significant bite marks on her tongue this appears to have healed  Chest is clear to auscultation there is no labored breathing  Heart is irregular irregular rate and rhythm without murmur gallop or rub her edema appears relatively baseline possibly slightly improved from previous exams she does have TED hose on.  Abdomen somewhat obese soft nontender with positive bowel sounds.  Musculo skeletal does move all extremities x4 at baseline she ambulates in a wheelchair-  Neurologic is grossly intact her speech is  clear could not appreciate any lateralizing findings.  Psych she is oriented to self very pleasant somewhat confused but continues to be in a jovial talkative mood  Labs reviewed: Recent Labs    12/30/17 0415 01/02/18 0700 01/23/18 0733 03/20/18 1740  NA 136 139 140  --   K 4.1 3.5 3.8  --   CL 98 98 102  --   CO2 30 28 29   --   GLUCOSE 205* 178* 194*  --   BUN 18 25* 21  --   CREATININE 0.99 1.25* 1.09* 1.10*  CALCIUM 8.9 9.3 8.8*  --    Recent Labs    08/08/17 1459 11/08/17 1625  AST 21 25  ALT 15 19  ALKPHOS 54 52  BILITOT 0.5 0.5  PROT 7.5 7.9  ALBUMIN 3.3* 3.3*   Recent Labs    10/19/17 0729 11/08/17 1625 11/27/17 0700 12/18/17 0700  WBC 9.1 9.8 9.7 9.1  NEUTROABS 5.2 5.6 5.3  --   HGB 11.6* 11.9* 12.3 11.8*  HCT 38.3 38.2 39.6 38.1  MCV 90.8 90.5 89.0 89.6  PLT 224 248 233 217   Lab Results  Component Value Date   TSH 3.269 11/08/2017   Lab Results  Component Value Date   HGBA1C 8.7 (H) 01/23/2018   Lab Results  Component Value Date   CHOL 149 12/18/2017   HDL 31 (L) 12/18/2017   LDLCALC 87 12/18/2017   TRIG 157 (H) 12/18/2017   CHOLHDL 4.8 12/18/2017    Significant Diagnostic Results in last 30 days:  Ct Head Wo Contrast  Result Date: 04/08/2018 CLINICAL DATA:  Fall with forehead hematoma.  History of dementia. EXAM: CT HEAD WITHOUT CONTRAST CT CERVICAL SPINE WITHOUT CONTRAST TECHNIQUE: Multidetector CT imaging of the head and cervical spine was performed following the standard protocol without intravenous contrast. Multiplanar CT image reconstructions of the cervical spine were also generated. COMPARISON:  03/20/2018 head CT.  01/14/2016 CT cervical spine. FINDINGS: CT HEAD FINDINGS Brain: Stable atrophy and small vessel disease. No evidence of intracranial hemorrhage, acute infarct, mass effect or hydrocephalus. Vascular: No hyperdense vessel or unexpected calcification. Skull: Normal. Negative for fracture or focal lesion. Sinuses/Orbits: No  acute finding. Other: Prominent midline frontal scalp hematoma identified. The previous right frontal scalp hematoma is smaller in size and resolving. CT CERVICAL SPINE FINDINGS Alignment: Stable alignment without subluxation. Skull base and vertebrae: No acute fractures identified. No bony lesions. Soft tissues and spinal canal: No acute soft tissue swelling. Disc levels: Advanced degenerative disc disease again identified with slight progression since 2017. Severe disc space narrowing at C3-4. Moderately severe degenerative disc disease also present at C5-6, C6-7 and C7-T1. Upper chest: Negative. IMPRESSION: 1. New prominent midline frontal scalp hematoma without underlying skull fracture or intracranial hemorrhage. The recent right frontal scalp hematoma is smaller in size and resolving. 2. No evidence of acute cervical spine injury. There is some progression of moderately severe multilevel cervical disc disease since the prior cervical CT in 2017. Electronically Signed   By: Aletta Edouard M.D.   On: 04/08/2018 10:15   Ct Cervical Spine Wo Contrast  Result Date: 04/08/2018 CLINICAL DATA:  Fall with forehead hematoma.  History of dementia. EXAM: CT HEAD WITHOUT CONTRAST CT CERVICAL SPINE WITHOUT CONTRAST TECHNIQUE: Multidetector CT imaging of the head and cervical spine was performed following the standard protocol without intravenous contrast. Multiplanar CT image reconstructions of the cervical spine were also generated. COMPARISON:  03/20/2018 head CT.  01/14/2016 CT cervical spine. FINDINGS:  CT HEAD FINDINGS Brain: Stable atrophy and small vessel disease. No evidence of intracranial hemorrhage, acute infarct, mass effect or hydrocephalus. Vascular: No hyperdense vessel or unexpected calcification. Skull: Normal. Negative for fracture or focal lesion. Sinuses/Orbits: No acute finding. Other: Prominent midline frontal scalp hematoma identified. The previous right frontal scalp hematoma is smaller in size  and resolving. CT CERVICAL SPINE FINDINGS Alignment: Stable alignment without subluxation. Skull base and vertebrae: No acute fractures identified. No bony lesions. Soft tissues and spinal canal: No acute soft tissue swelling. Disc levels: Advanced degenerative disc disease again identified with slight progression since 2017. Severe disc space narrowing at C3-4. Moderately severe degenerative disc disease also present at C5-6, C6-7 and C7-T1. Upper chest: Negative. IMPRESSION: 1. New prominent midline frontal scalp hematoma without underlying skull fracture or intracranial hemorrhage. The recent right frontal scalp hematoma is smaller in size and resolving. 2. No evidence of acute cervical spine injury. There is some progression of moderately severe multilevel cervical disc disease since the prior cervical CT in 2017. Electronically Signed   By: Aletta Edouard M.D.   On: 04/08/2018 10:15    Assessment/Plan  #1- history of dementia with behaviors this appears stable on Namenda she is on Celexa for coexistent depression- no recent behaviors noted she does at times try to get get out of her wheelchair and go to the bathroom without assistance and she is been advised to call for help she appears to understand this somewhat-but I suspect this will continue to be a challenge staff again has been trying to take her to the bathroom more often as a preventative measure  2.  History of forehead bruising again this appears to have largely resolved status post fall CT scan again was negative for any acute process.  3.  History of type 2 diabetes she continues on Janumet as well as bolus NovoLog which was recently increased she is also on Basaglar insulin as noted above hemoglobin A1c in September was 8.7 will update this-again somewhat conservative here because of risk of hypoglycemia in the past.  3.  History of diastolic CHF her edema appears stable to somewhat reduced her weight loss I suspect could be fluid  related- she is on Lasix and metolazone as well as a beta-blocker she is not complaining of any chest pain or shortness of breath.--We will update a basic metabolic panel  4.  History of atrial fibrillation this appears rate controlled with Lopressor and digoxin-she is on Eliquis for anticoagulation.  5.  History of GI bleed she is status post colonoscopy with polypectomy her hemoglobin has shown stability will update this- she appears to have tolerated the Eliquis well-again she is on this for atrial fibrillation.  6.-  History of hypertension this appears stable as noted above with blood pressures largely in low 098J systolically she is on Lasix and metolazone-2 days a week- as well as Lopressor-again has not tolerated Lotensin secondary to hypotension in the past  7.  History of osteoporosis she continues on Fosamax this appears to be well-tolerated.  8.  History of hyperlipidemia she is on Pravachol LDL satisfactory at 87.  9.  History of right lower leg recurrent cellulitis I do not see any evidence of that today.  [id number]

## 2018-04-26 ENCOUNTER — Encounter (HOSPITAL_COMMUNITY)
Admission: RE | Admit: 2018-04-26 | Discharge: 2018-04-26 | Disposition: A | Discharge status: Home / Self Care | Payer: Medicare Other | Source: Skilled Nursing Facility | Attending: Internal Medicine | Admitting: Internal Medicine

## 2018-04-26 DIAGNOSIS — I5042 Chronic combined systolic (congestive) and diastolic (congestive) heart failure: Secondary | ICD-10-CM | POA: Diagnosis not present

## 2018-04-26 DIAGNOSIS — R1310 Dysphagia, unspecified: Secondary | ICD-10-CM | POA: Insufficient documentation

## 2018-04-26 DIAGNOSIS — I480 Paroxysmal atrial fibrillation: Secondary | ICD-10-CM | POA: Insufficient documentation

## 2018-04-26 DIAGNOSIS — E1140 Type 2 diabetes mellitus with diabetic neuropathy, unspecified: Secondary | ICD-10-CM | POA: Insufficient documentation

## 2018-04-26 DIAGNOSIS — I872 Venous insufficiency (chronic) (peripheral): Secondary | ICD-10-CM | POA: Insufficient documentation

## 2018-04-26 LAB — CBC WITH DIFFERENTIAL/PLATELET
ABS IMMATURE GRANULOCYTES: 0.02 10*3/uL (ref 0.00–0.07)
BASOS ABS: 0.1 10*3/uL (ref 0.0–0.1)
BASOS PCT: 1 %
Eosinophils Absolute: 0.4 10*3/uL (ref 0.0–0.5)
Eosinophils Relative: 5 %
HCT: 38 % (ref 36.0–46.0)
Hemoglobin: 11.7 g/dL — ABNORMAL LOW (ref 12.0–15.0)
Immature Granulocytes: 0 %
LYMPHS PCT: 23 %
Lymphs Abs: 1.8 10*3/uL (ref 0.7–4.0)
MCH: 28 pg (ref 26.0–34.0)
MCHC: 30.8 g/dL (ref 30.0–36.0)
MCV: 90.9 fL (ref 80.0–100.0)
MONO ABS: 0.8 10*3/uL (ref 0.1–1.0)
Monocytes Relative: 10 %
NEUTROS ABS: 4.8 10*3/uL (ref 1.7–7.7)
NEUTROS PCT: 61 %
NRBC: 0 % (ref 0.0–0.2)
Platelets: 241 10*3/uL (ref 150–400)
RBC: 4.18 MIL/uL (ref 3.87–5.11)
RDW: 14.2 % (ref 11.5–15.5)
WBC: 7.9 10*3/uL (ref 4.0–10.5)

## 2018-04-26 LAB — BASIC METABOLIC PANEL
Anion gap: 9 (ref 5–15)
BUN: 24 mg/dL — AB (ref 8–23)
CO2: 28 mmol/L (ref 22–32)
Calcium: 9.3 mg/dL (ref 8.9–10.3)
Chloride: 101 mmol/L (ref 98–111)
Creatinine, Ser: 1.03 mg/dL — ABNORMAL HIGH (ref 0.44–1.00)
GFR, EST NON AFRICAN AMERICAN: 52 mL/min — AB (ref >60)
Glucose, Bld: 147 mg/dL — ABNORMAL HIGH (ref 70–99)
POTASSIUM: 3.6 mmol/L (ref 3.5–5.1)
SODIUM: 138 mmol/L (ref 135–145)

## 2018-05-15 IMAGING — DX DG FEMUR 2+V*R*
4 series · 4 of 4 positions shown · non-contrast
Comparison: Right femur films of 01/30/2015

CLINICAL DATA: Fell with pain and swelling in the right leg

EXAM:
RIGHT FEMUR 2 VIEWS

[femur ap (1 of 2)]
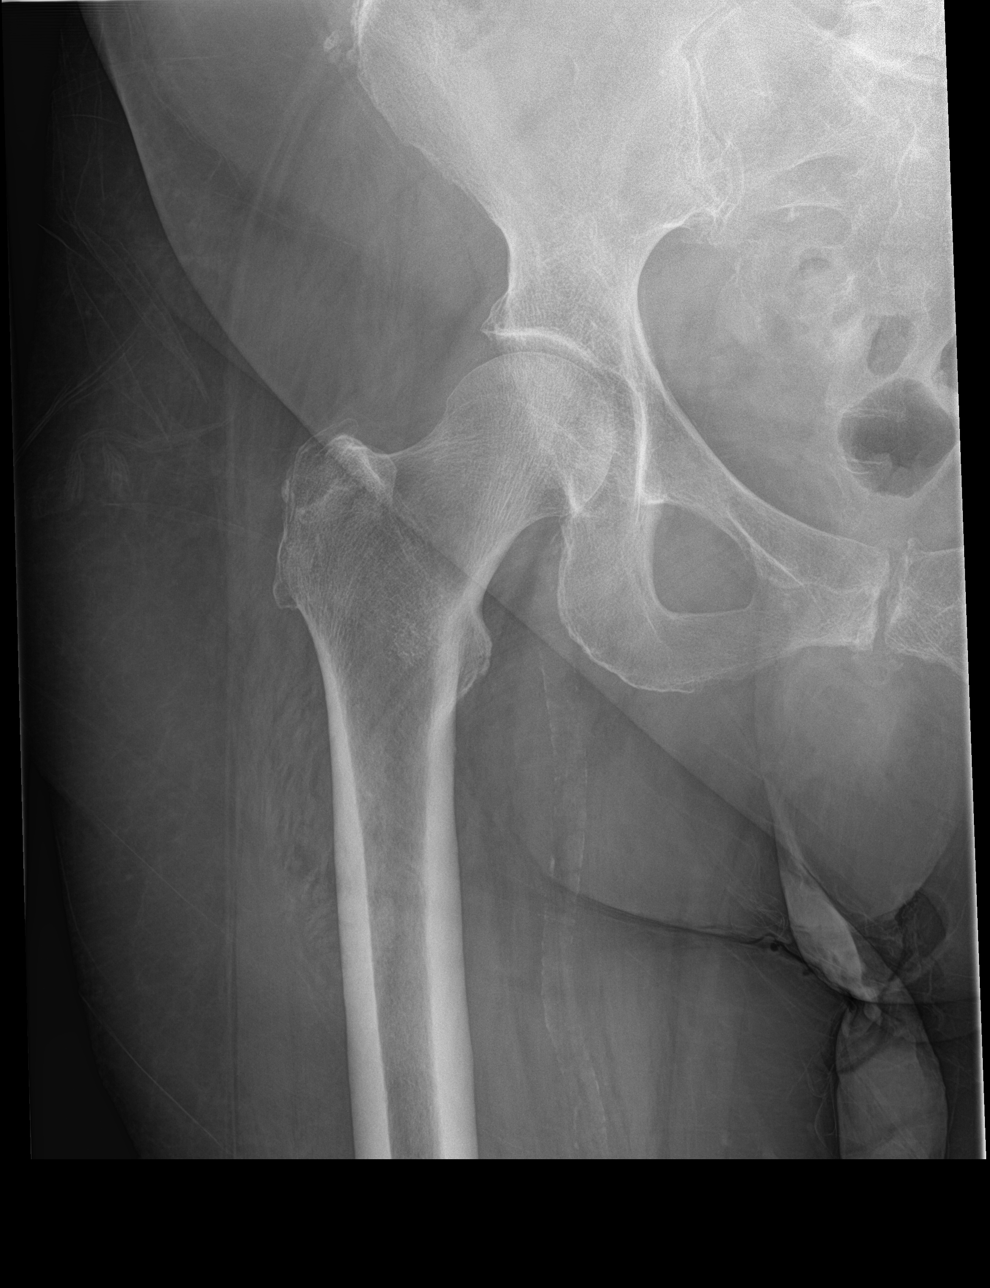

[femur ap (2 of 2)]
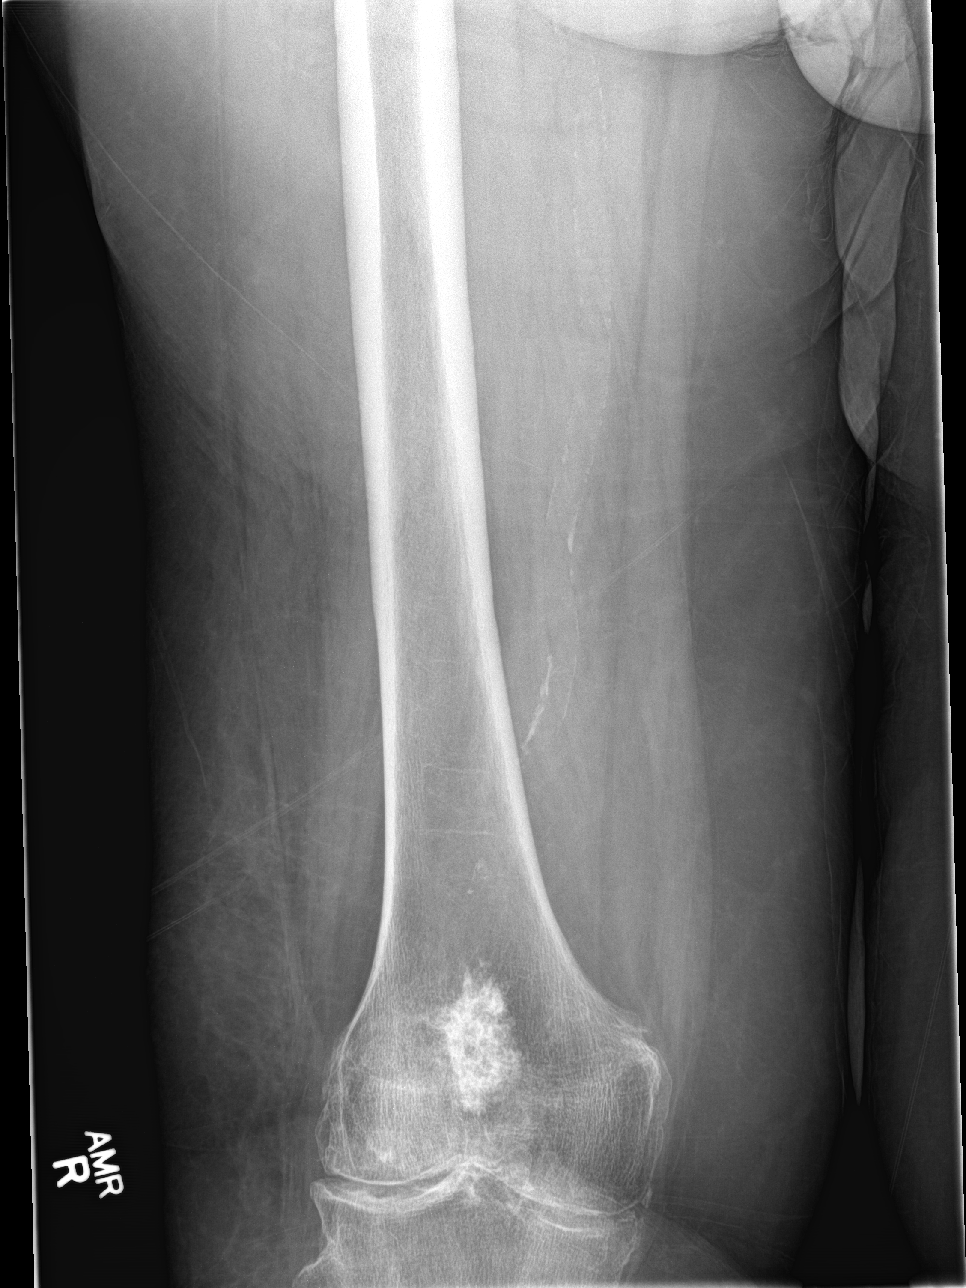

[femur lat (1 of 2)]
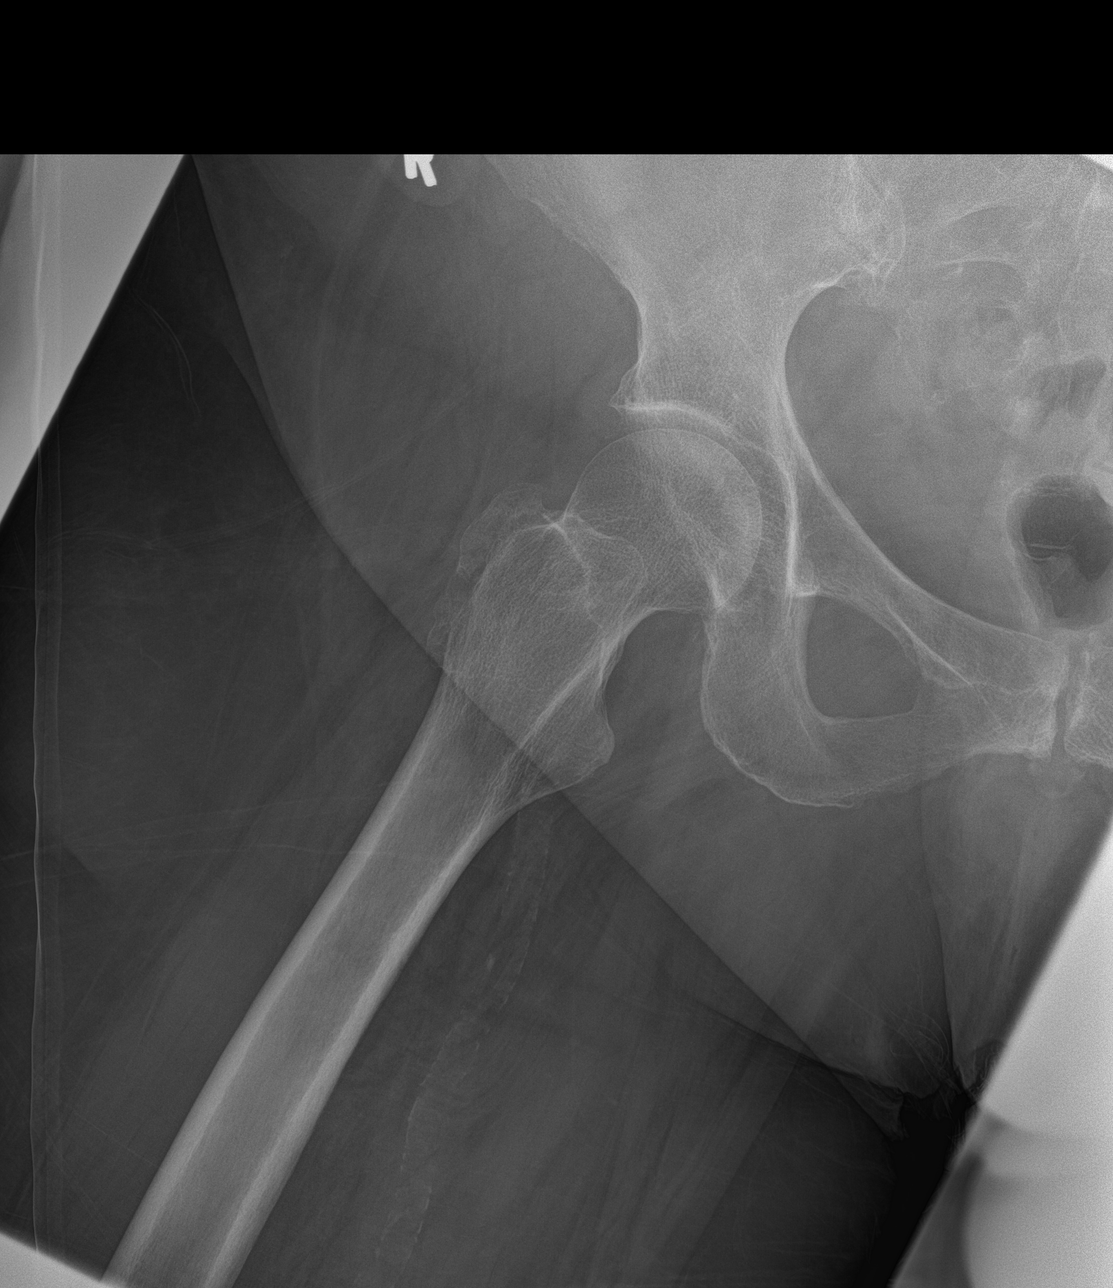

[femur lat (2 of 2)]
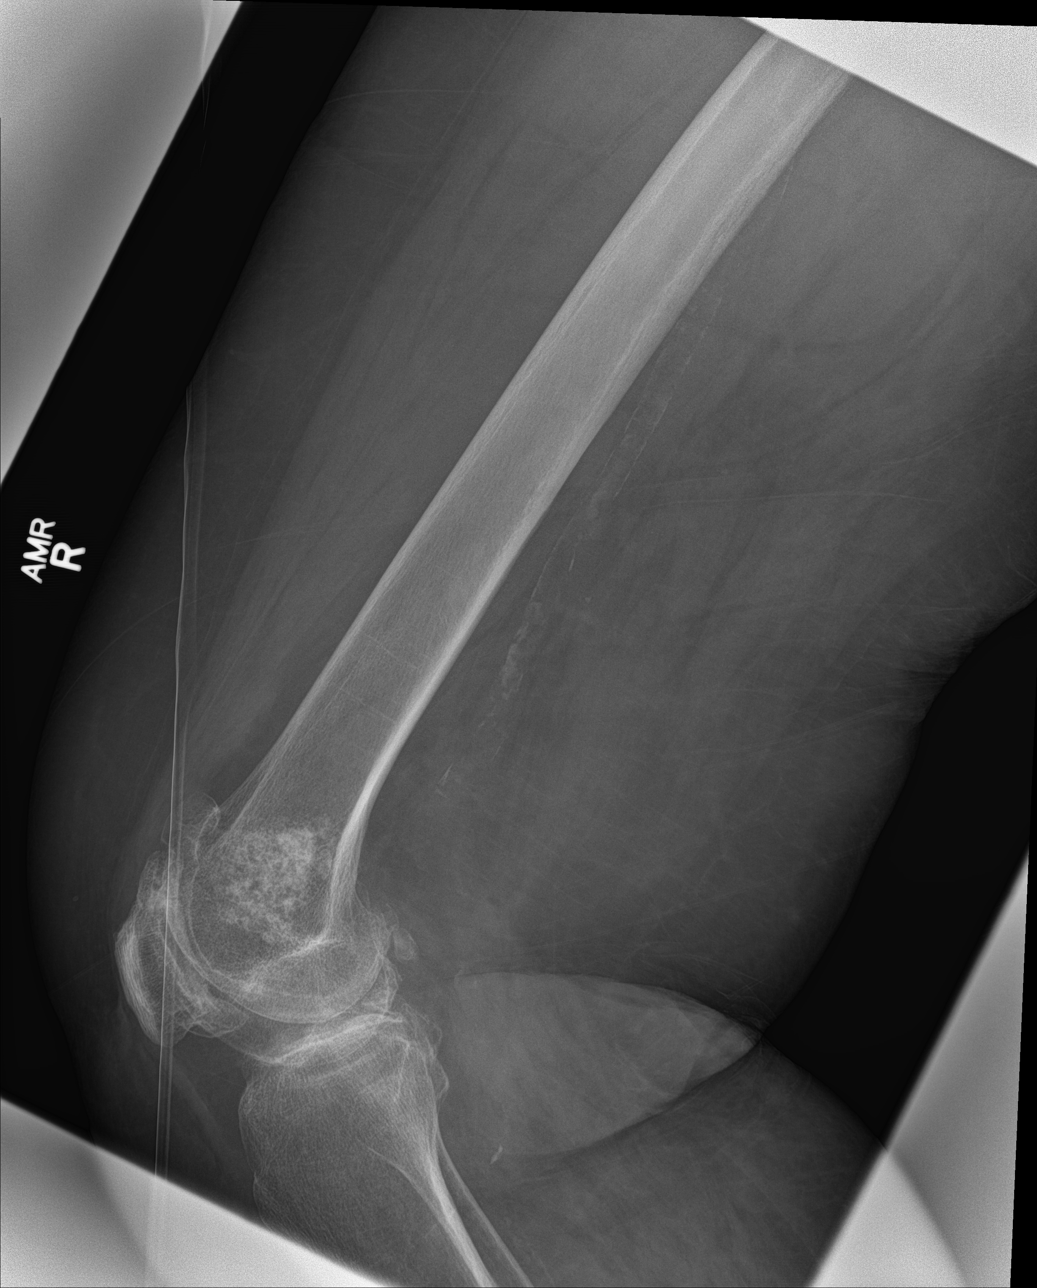

[4 of 4 positions shown; findings below may reference images not displayed]

FINDINGS: The right hip joint appears normal with only minimal degenerative
change for age. No fracture is seen. Right femur is intact. A
sclerotic area in the distal right femur is stable compared to prior
films and most likely represents a benign process. There are
calcifications of the superficial femoral artery noted with
degenerative changes present in the right knee
IMPRESSION: No acute fracture.

## 2018-05-15 IMAGING — DX DG TIBIA/FIBULA 2V*R*
2 series · 2 of 2 positions shown · non-contrast
Comparison: None

CLINICAL DATA: Fell recently with pain and swelling in the right
leg

EXAM:
RIGHT TIBIA AND FIBULA - 2 VIEW

[tibia ap]
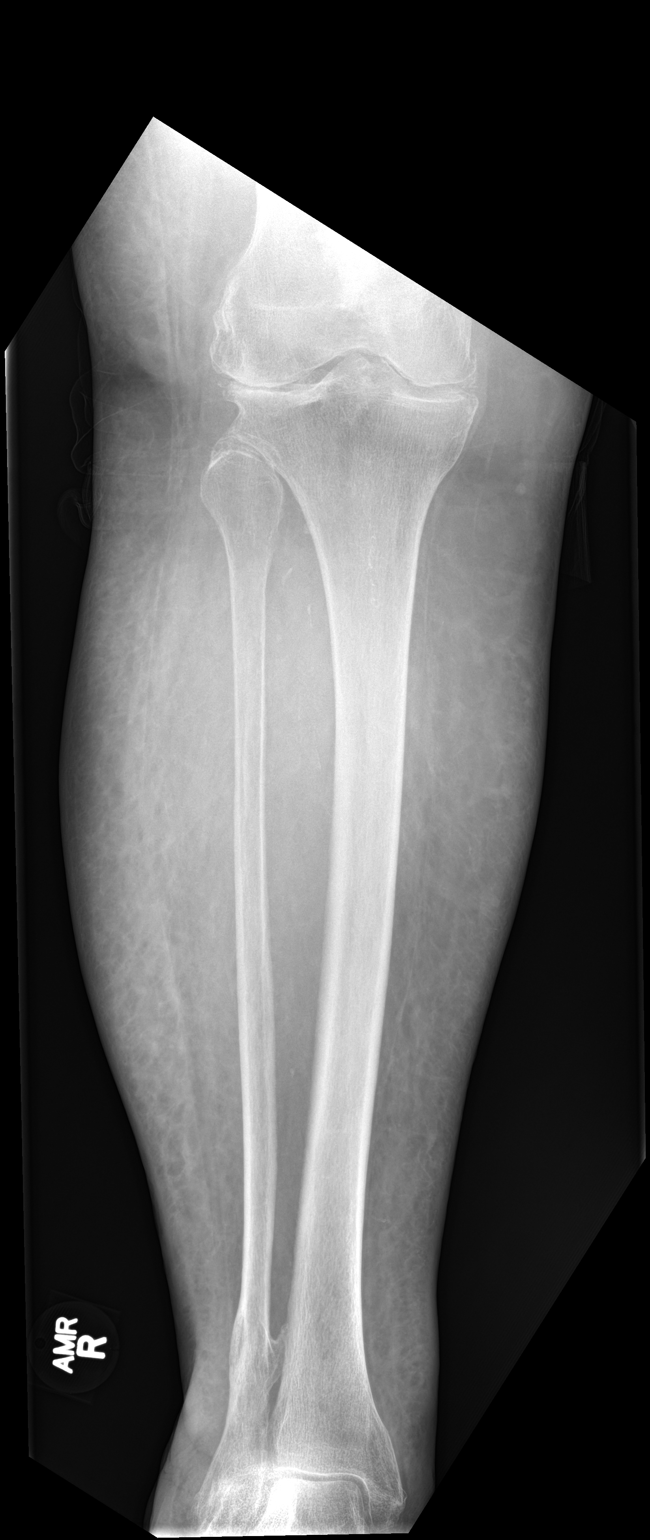

[tibia lat]
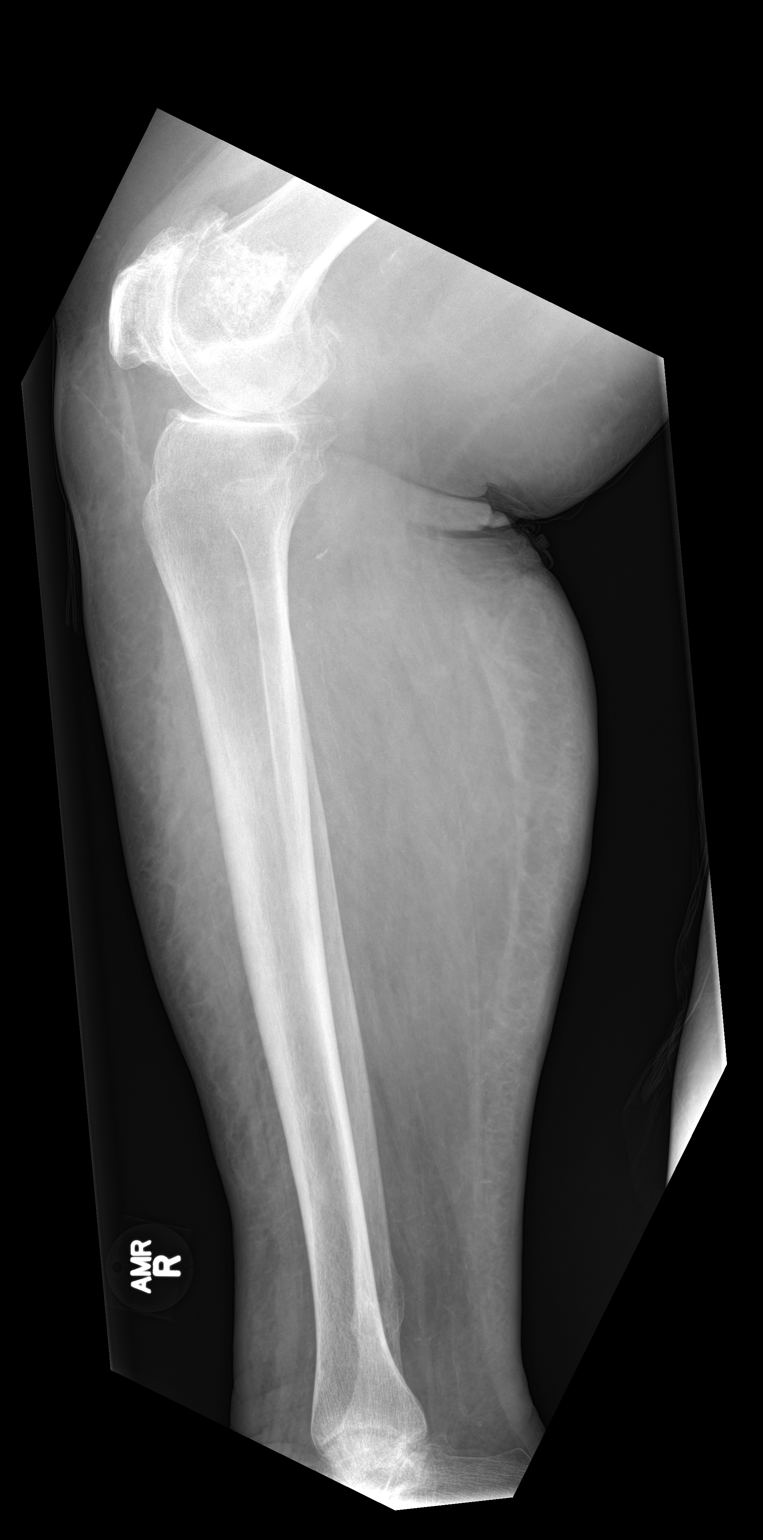

[2 of 2 positions shown; findings below may reference images not displayed]

FINDINGS: The right tibia and fibula are intact. There is bony bridging across
the distal right femur toward the tibia of a portion of the
interosseous membrane. No acute fracture is seen. There are
degenerative changes in the right knee with loss of joint space,
spurring, and probable chondrocalcinosis.
IMPRESSION: No acute fracture. Degenerative change in the right knee with
apparent chondrocalcinosis as well.

## 2018-05-21 ENCOUNTER — Encounter: Payer: Self-pay | Admitting: Internal Medicine

## 2018-05-21 ENCOUNTER — Non-Acute Institutional Stay (SKILLED_NURSING_FACILITY): Payer: Medicare Other | Admitting: Internal Medicine

## 2018-05-21 DIAGNOSIS — Y92129 Unspecified place in nursing home as the place of occurrence of the external cause: Secondary | ICD-10-CM | POA: Diagnosis not present

## 2018-05-21 DIAGNOSIS — Z7901 Long term (current) use of anticoagulants: Secondary | ICD-10-CM | POA: Diagnosis not present

## 2018-05-21 DIAGNOSIS — I482 Chronic atrial fibrillation, unspecified: Secondary | ICD-10-CM

## 2018-05-21 DIAGNOSIS — Z794 Long term (current) use of insulin: Secondary | ICD-10-CM | POA: Diagnosis not present

## 2018-05-21 DIAGNOSIS — E1140 Type 2 diabetes mellitus with diabetic neuropathy, unspecified: Secondary | ICD-10-CM | POA: Diagnosis not present

## 2018-05-21 DIAGNOSIS — R269 Unspecified abnormalities of gait and mobility: Secondary | ICD-10-CM | POA: Insufficient documentation

## 2018-05-21 DIAGNOSIS — W19XXXA Unspecified fall, initial encounter: Secondary | ICD-10-CM | POA: Diagnosis not present

## 2018-05-21 NOTE — Progress Notes (Signed)
Location:    [facility] Room Number: 113/E Place of Service:  SNF [id number]) Provider:  Dionicia Abler, MD  Patient Care Team: Celedonio Savage, MD as PCP - General (Family Medicine) Rothbart, Cristopher Estimable, MD (Cardiology) Lendon Colonel, NP as Nurse Practitioner (Nurse Practitioner)  Extended Emergency Contact Information Primary Emergency Contact: [facility] Address: [address]          [place], [place] [address] [name] Phone: [phone] Mobile Phone: [phone] Relation: Daughter  Code Status:  DNR Goals of care: Advanced Directive information Advanced Directives 05/21/2018  Does Patient Have a Medical Advance Directive? Yes  Type of Advance Directive Out of facility DNR (pink MOST or yellow form)  Does patient want to make changes to medical advance directive? No - Patient declined  Copy of [place] in Chart? No - copy requested  Pre-existing out of facility DNR order (yellow form or pink MOST form) -     Chief Complaint  Patient presents with  . Anxiety    Fall with Head Abrasion    HPI:  Pt is a 78 y.o. female seen today for an acute visit for follow-up of a fall with an abrasion small bruise on the right forehead and a small hematoma left side of her forehead.  Patient is a long-term resident of facility with a history of type 2 diabetes as well as atrial fibrillation on chronic Eliquis as well as venous stasis edema dementia with behaviors recurrent cellulitis right lower extremity and a history of GI bleed status post colonoscopy with polypectomy-she also has recurrent falls.  About a month ago she was sent to the emergency department after a fall in the facility she was trying to get up at night go to the bathroom apparently similar situation last evening.  At that time she had a bruise on her head CT of the scan of the head did not show any acute process besides the hematoma she had  significant bruising at that time of her forehead.  That has largely resolved.  Apparently last night she was trying to get out of bed and fell she was noted to have a small hematoma left side of her forehead and a small bruise right forehead.  Vital signs were stable as well as neurochecks apparently there was no loss of consciousness --basically apparently try to get out of bed again and fell  Is not complaining of any joint pain currently.  Regards to other medical issues she does have a history of atrial fibrillation again continues on the Eliquis she also is on Lopressor as well as digoxin this appears rate controlled.  Regards lower extremity edema she is on metolazone 2 times a week as well as Lasix every day her weight appears to be relatively stable over the past couple months.  Currently she is sitting in her chair comfortably is baseline confusion but appears to be essentially at her baseline she is not complaining of any headache dizziness or musculoskeletal pain at this time    Past Medical History:  Diagnosis Date  . Allergy   . Atrial fibrillation ([place]) 08/2011   First diagnosed in 08/2011; duration of arrhythmia is uncertain  . Bilateral lower extremity edema   . Chronic diarrhea    diverticulosis  . COPD (chronic obstructive pulmonary disease) ([place])   . Dementia ([place])   . Diabetes mellitus, type 2 (HCC)    Diabetic neuropathy  . Dyspnea on  exertion    pedal edema  . Gout   . Headache(784.0)    twice weekly  . Hyperlipidemia   . Hypertension   . Osteopenia    DEXA scan 01/2010  . Palpitations   . Seasonal allergies   . Stress incontinence   . Vertigo    Past Surgical History:  Procedure Laterality Date  . APPENDECTOMY    . BREAST BIOPSY  2002   Right  . CESAREAN SECTION     x 2  . CHOLECYSTECTOMY    . COLONOSCOPY  2007   Negative screening study  . COLONOSCOPY WITH PROPOFOL N/A 09/18/2017   Procedure: COLONOSCOPY WITH PROPOFOL;  Surgeon: Daneil Dolin, MD;  [facility];  Service: Endoscopy;  Laterality: N/A;  11:00am  . HAMMER TOE SURGERY     Bilateral hammer toe amputation  . INCISIONAL HERNIA REPAIR    . KNEE ARTHROSCOPY W/ MENISCAL REPAIR  2007   Bilateral  . POLYPECTOMY  09/18/2017   Procedure: POLYPECTOMY;  Surgeon: Daneil Dolin, MD;  [facility];  Service: Endoscopy;;  colon  . UMBILICAL HERNIA REPAIR      Allergies  Allergen Reactions  . Codeine Anaphylaxis and Hives  . Morphine And Related Anaphylaxis and Hives  . Penicillins Anaphylaxis  . Ace Inhibitors Cough  . Morphine   . Penicillin G     Outpatient Encounter Medications as of 05/21/2018  Medication Sig  . acetaminophen (TYLENOL) 325 MG tablet Take 650 mg by mouth every 6 (six) hours as needed.   Marland Kitchen alendronate (FOSAMAX) 70 MG tablet Give 1 tablet by mouth on Sunday. Take with a full glass of water on an empty stomach.  Marland Kitchen apixaban (ELIQUIS) 5 MG TABS tablet Take 5 mg by mouth 2 (two) times daily.   . carboxymethylcellulose (REFRESH PLUS) 0.5 % SOLN Place 1 drop into both eyes 4 (four) times daily.  . cholecalciferol (VITAMIN D) 1000 UNITS tablet Take 1,000 Units by mouth daily.  . citalopram (CELEXA) 20 MG tablet Take 1 tablet (20 mg total) by mouth daily.  . digoxin (LANOXIN) 0.125 MG tablet Take 1 tablet by mouth once a day (HOLD FOR AP UNDER 60)  . furosemide (LASIX) 20 MG tablet Take 40 mg by mouth daily.   . insulin aspart (NOVOLOG FLEXPEN) 100 UNIT/ML FlexPen Inject 12-14 Units into the skin 2 (two) times daily. Per sliding scale  . Insulin Glargine (BASAGLAR KWIKPEN) 100 UNIT/ML SOPN Inject 48 Units into the skin at bedtime.   . Insulin Pen Needle (EASY TOUCH PEN NEEDLES) 31G X 8 MM MISC Use twice a day  . memantine (NAMENDA) 10 MG tablet Take 10 mg by mouth 2 (two) times daily.  . metolazone (ZAROXOLYN) 2.5 MG tablet Take 5 mg by mouth daily. Mon. Fri   . metoprolol tartrate (LOPRESSOR) 25 MG tablet Take 75 mg by mouth 2  (two) times daily.  . Multiple Vitamin (MULTIVITAMIN WITH MINERALS) TABS Take 1 tablet by mouth every morning.  Marland Kitchen omeprazole (PRILOSEC) 40 MG capsule Take 40 mg by mouth daily.  . polyethylene glycol (MIRALAX / GLYCOLAX) packet Take 17 g by mouth daily.  . potassium chloride (MICRO-K) 10 MEQ CR capsule Take 40 mEq by mouth daily. Potassium chloride extended release capsule, give 20 meq by mouth once an evening  . pravastatin (PRAVACHOL) 20 MG tablet Take 20 mg by mouth daily. Take along with 40 mg to = 60 mg  . pravastatin (PRAVACHOL) 40 MG tablet  Take 40 mg by mouth daily. Take along with 20 mg to = 60 mg  . sitaGLIPtin-metformin (JANUMET) 50-500 MG tablet Take 1 tablet by mouth 2 (two) times daily with a meal.   No facility-administered encounter medications on file as of 05/21/2018.     Review of Systems   This is limited secondary to dementia please see HPI  Immunization History  Administered Date(s) Administered  . Influenza-Unspecified 02/04/2016, 02/03/2017  . Pneumococcal Conjugate-13 02/03/2017  . Pneumococcal-Unspecified 02/08/2016  . Tdap 01/19/2017   Pertinent  Health Maintenance Due  Topic Date Due  . HEMOGLOBIN A1C  07/24/2018  . URINE MICROALBUMIN  08/23/2018  . FOOT EXAM  10/31/2018  . OPHTHALMOLOGY EXAM  03/07/2019  . INFLUENZA VACCINE  Completed  . DEXA SCAN  Completed  . PNA vac Low Risk Adult  Completed   Fall Risk  01/30/2018 01/19/2017 11/06/2015 09/29/2015 08/31/2015  Falls in the past year? No No Yes No No  Number falls in past yr: - - 2 or more - -  Injury with Fall? - - No - -   Functional Status Survey:    Vitals:   05/21/18 1250  BP: 111/69  Pulse: 85  Resp: 18  Temp: 97.8 F (36.6 C)  TempSrc: Oral  Weight is 196 pounds Physical Exam   In general this is a pleasant elderly female somewhat confused but at her baseline continues to be pleasant sitting comfortably in her wheelchair.  Her skin is warm and dry there is a small violaceous bruise  right side of her forehead- the left forehead area there is a small hematoma approximately a centimeter in diameter there is no surrounding erythema or sign of cellulitis there is some slight tenderness palpation of the hematoma.  Eyes visual acuity appears to be intact sclera and conjunctive are clear --pupils are equal round reactive to light.  Oropharynx is clear mucous membranes moist.  Chest is clear to auscultation there is no labored breathing.  Heart is irregular irregular rate and rhythm without murmur gallop or rub she appears to have her baseline lower extremity edema bit more on the right versus the left.  I do not see signs of cellulitis there is just pale erythema at baseline.  Abdomen is somewhat obese soft nontender with positive bowel sounds.  Musculoskeletal is able to move all her extremities x4 at baseline with passive range of motion at the knees and hips cannot really appreciate any significant pain--- grip strength appears to be intact upper extremities with flexion and extension---she is able to raise her arms she says she has some mild chronic discomfort with this but nothing new   Neurologic is grossly intact her speech is clear no lateralizing findings.  Psych she is oriented to self she is pleasant follow simple verbal commands  Labs reviewed: Recent Labs    01/02/18 0700 01/23/18 0733 03/20/18 1740 04/26/18 0740  NA 139 140  --  138  K 3.5 3.8  --  3.6  CL 98 102  --  101  CO2 28 29  --  28  GLUCOSE 178* 194*  --  147*  BUN 25* 21  --  24*  CREATININE 1.25* 1.09* 1.10* 1.03*  CALCIUM 9.3 8.8*  --  9.3   Recent Labs    08/08/17 1459 11/08/17 1625  AST 21 25  ALT 15 19  ALKPHOS 54 52  BILITOT 0.5 0.5  PROT 7.5 7.9  ALBUMIN 3.3* 3.3*   Recent Labs  11/08/17 1625 11/27/17 0700 12/18/17 0700 04/26/18 0740  WBC 9.8 9.7 9.1 7.9  NEUTROABS 5.6 5.3  --  4.8  HGB 11.9* 12.3 11.8* 11.7*  HCT 38.2 39.6 38.1 38.0  MCV 90.5 89.0 89.6 90.9    PLT 248 233 217 241   Lab Results  Component Value Date   TSH 3.269 11/08/2017   Lab Results  Component Value Date   HGBA1C 8.7 (H) 01/23/2018   Lab Results  Component Value Date   CHOL 149 12/18/2017   HDL 31 (L) 12/18/2017   LDLCALC 87 12/18/2017   TRIG 157 (H) 12/18/2017   CHOLHDL 4.8 12/18/2017    Significant Diagnostic Results in last 30 days:  No results found.  Assessment/Plan  #1 fall recurrent- with gait abnormality-- she appears to be at her baseline today-neurologically is intact vital signs are stable-apparently she once again was trying to get to the bathroom overnight and fell.  He has been reinforced again to call for help although again I suspect this is a challenge.  She does continue on Eliquis at this point.--However per discussion with Dr. Lyndel Safe any additional fall I suspect will prompt serious consideration of discontinuing this- will try to discuss this with her daughter  2.  History of atrial fibrillation appears rate controlled on Lopressor and digoxin digoxin level was normal back in September 2018 she is on Eliquis 5 mg twice daily for anticoagulation.  3.  History of type 2 diabetes hemoglobin A1c was 8.7 in September will update this--- if this is 8 or less suspect we will not be aggressive changing medications-her bolus NovoLog has been increased  Her sugars continue to be variable running mainly in the mid 100s in the morning the lowest I see is 73 2 mornings ago.  At noon often have readings the higher 100s to mid 200s.  At 5 PM also appears to have readings mainly in the 200s.  At at bedtime readings ranging from the higher 100s to 300s at times.  She is on Basaglar insulin 48 units nightly- as well as Janumet 50-500 twice daily.  And NovoLog bolus 12 units 151-250 as well as 14 units if CBG is between 251 and 350.  This is done at lunch and supper.  4.  History of lower extremity edema with diastolic CHF her weight of edema appears to  be stable on metolazone 5 mg 2 days a week as well as Lasix 40 mg every day recent metabolic panel showed stability.  5.  History of GI bleed with history of colonoscopy and polypectomy in the past hemoglobin shows stability at 11.7 on lab done in late December.   [id number]

## 2018-05-21 NOTE — Progress Notes (Signed)
This encounter was created in error - please disregard.

## 2018-05-23 ENCOUNTER — Encounter (HOSPITAL_COMMUNITY)
Admission: RE | Admit: 2018-05-23 | Discharge: 2018-05-23 | Disposition: A | Discharge status: Home / Self Care | Payer: Medicare Other | Source: Skilled Nursing Facility | Attending: Internal Medicine | Admitting: Internal Medicine

## 2018-05-23 DIAGNOSIS — E1140 Type 2 diabetes mellitus with diabetic neuropathy, unspecified: Secondary | ICD-10-CM | POA: Insufficient documentation

## 2018-05-23 DIAGNOSIS — R1310 Dysphagia, unspecified: Secondary | ICD-10-CM | POA: Insufficient documentation

## 2018-05-23 DIAGNOSIS — I480 Paroxysmal atrial fibrillation: Secondary | ICD-10-CM | POA: Insufficient documentation

## 2018-05-23 DIAGNOSIS — I5042 Chronic combined systolic (congestive) and diastolic (congestive) heart failure: Secondary | ICD-10-CM | POA: Insufficient documentation

## 2018-05-23 LAB — HEMOGLOBIN A1C
Hgb A1c MFr Bld: 8 % — ABNORMAL HIGH (ref 4.8–5.6)
MEAN PLASMA GLUCOSE: 182.9 mg/dL

## 2018-05-31 DIAGNOSIS — R41841 Cognitive communication deficit: Secondary | ICD-10-CM | POA: Diagnosis not present

## 2018-05-31 DIAGNOSIS — M6281 Muscle weakness (generalized): Secondary | ICD-10-CM | POA: Diagnosis not present

## 2018-05-31 DIAGNOSIS — F0390 Unspecified dementia without behavioral disturbance: Secondary | ICD-10-CM | POA: Diagnosis not present

## 2018-05-31 DIAGNOSIS — E1140 Type 2 diabetes mellitus with diabetic neuropathy, unspecified: Secondary | ICD-10-CM | POA: Diagnosis not present

## 2018-06-01 DIAGNOSIS — E1140 Type 2 diabetes mellitus with diabetic neuropathy, unspecified: Secondary | ICD-10-CM | POA: Diagnosis not present

## 2018-06-01 DIAGNOSIS — F0390 Unspecified dementia without behavioral disturbance: Secondary | ICD-10-CM | POA: Diagnosis not present

## 2018-06-01 DIAGNOSIS — R41841 Cognitive communication deficit: Secondary | ICD-10-CM | POA: Diagnosis not present

## 2018-06-01 DIAGNOSIS — M6281 Muscle weakness (generalized): Secondary | ICD-10-CM | POA: Diagnosis not present

## 2018-06-02 DIAGNOSIS — F0390 Unspecified dementia without behavioral disturbance: Secondary | ICD-10-CM | POA: Diagnosis not present

## 2018-06-02 DIAGNOSIS — R41841 Cognitive communication deficit: Secondary | ICD-10-CM | POA: Diagnosis not present

## 2018-06-02 DIAGNOSIS — M6281 Muscle weakness (generalized): Secondary | ICD-10-CM | POA: Diagnosis not present

## 2018-06-02 DIAGNOSIS — E1140 Type 2 diabetes mellitus with diabetic neuropathy, unspecified: Secondary | ICD-10-CM | POA: Diagnosis not present

## 2018-06-03 DIAGNOSIS — F0390 Unspecified dementia without behavioral disturbance: Secondary | ICD-10-CM | POA: Diagnosis not present

## 2018-06-03 DIAGNOSIS — R41841 Cognitive communication deficit: Secondary | ICD-10-CM | POA: Diagnosis not present

## 2018-06-03 DIAGNOSIS — E1140 Type 2 diabetes mellitus with diabetic neuropathy, unspecified: Secondary | ICD-10-CM | POA: Diagnosis not present

## 2018-06-03 DIAGNOSIS — M6281 Muscle weakness (generalized): Secondary | ICD-10-CM | POA: Diagnosis not present

## 2018-06-05 DIAGNOSIS — E1140 Type 2 diabetes mellitus with diabetic neuropathy, unspecified: Secondary | ICD-10-CM | POA: Diagnosis not present

## 2018-06-05 DIAGNOSIS — R41841 Cognitive communication deficit: Secondary | ICD-10-CM | POA: Diagnosis not present

## 2018-06-05 DIAGNOSIS — F0390 Unspecified dementia without behavioral disturbance: Secondary | ICD-10-CM | POA: Diagnosis not present

## 2018-06-05 DIAGNOSIS — M6281 Muscle weakness (generalized): Secondary | ICD-10-CM | POA: Diagnosis not present

## 2018-06-07 DIAGNOSIS — B351 Tinea unguium: Secondary | ICD-10-CM | POA: Diagnosis not present

## 2018-06-07 DIAGNOSIS — E1140 Type 2 diabetes mellitus with diabetic neuropathy, unspecified: Secondary | ICD-10-CM | POA: Diagnosis not present

## 2018-06-07 DIAGNOSIS — L603 Nail dystrophy: Secondary | ICD-10-CM | POA: Diagnosis not present

## 2018-06-07 DIAGNOSIS — Z7984 Long term (current) use of oral hypoglycemic drugs: Secondary | ICD-10-CM | POA: Diagnosis not present

## 2018-06-07 DIAGNOSIS — M79674 Pain in right toe(s): Secondary | ICD-10-CM | POA: Diagnosis not present

## 2018-06-07 DIAGNOSIS — M79675 Pain in left toe(s): Secondary | ICD-10-CM | POA: Diagnosis not present

## 2018-06-07 DIAGNOSIS — E1151 Type 2 diabetes mellitus with diabetic peripheral angiopathy without gangrene: Secondary | ICD-10-CM | POA: Diagnosis not present

## 2018-06-08 DIAGNOSIS — R41841 Cognitive communication deficit: Secondary | ICD-10-CM | POA: Diagnosis not present

## 2018-06-08 DIAGNOSIS — M6281 Muscle weakness (generalized): Secondary | ICD-10-CM | POA: Diagnosis not present

## 2018-06-08 DIAGNOSIS — F0390 Unspecified dementia without behavioral disturbance: Secondary | ICD-10-CM | POA: Diagnosis not present

## 2018-06-08 DIAGNOSIS — E1140 Type 2 diabetes mellitus with diabetic neuropathy, unspecified: Secondary | ICD-10-CM | POA: Diagnosis not present

## 2018-06-09 DIAGNOSIS — R41841 Cognitive communication deficit: Secondary | ICD-10-CM | POA: Diagnosis not present

## 2018-06-09 DIAGNOSIS — F0390 Unspecified dementia without behavioral disturbance: Secondary | ICD-10-CM | POA: Diagnosis not present

## 2018-06-09 DIAGNOSIS — M6281 Muscle weakness (generalized): Secondary | ICD-10-CM | POA: Diagnosis not present

## 2018-06-09 DIAGNOSIS — E1140 Type 2 diabetes mellitus with diabetic neuropathy, unspecified: Secondary | ICD-10-CM | POA: Diagnosis not present

## 2018-06-11 DIAGNOSIS — E1140 Type 2 diabetes mellitus with diabetic neuropathy, unspecified: Secondary | ICD-10-CM | POA: Diagnosis not present

## 2018-06-11 DIAGNOSIS — M6281 Muscle weakness (generalized): Secondary | ICD-10-CM | POA: Diagnosis not present

## 2018-06-11 DIAGNOSIS — F0390 Unspecified dementia without behavioral disturbance: Secondary | ICD-10-CM | POA: Diagnosis not present

## 2018-06-11 DIAGNOSIS — R41841 Cognitive communication deficit: Secondary | ICD-10-CM | POA: Diagnosis not present

## 2018-06-12 DIAGNOSIS — R41841 Cognitive communication deficit: Secondary | ICD-10-CM | POA: Diagnosis not present

## 2018-06-12 DIAGNOSIS — M6281 Muscle weakness (generalized): Secondary | ICD-10-CM | POA: Diagnosis not present

## 2018-06-12 DIAGNOSIS — F0390 Unspecified dementia without behavioral disturbance: Secondary | ICD-10-CM | POA: Diagnosis not present

## 2018-06-12 DIAGNOSIS — E1140 Type 2 diabetes mellitus with diabetic neuropathy, unspecified: Secondary | ICD-10-CM | POA: Diagnosis not present

## 2018-06-15 DIAGNOSIS — R41841 Cognitive communication deficit: Secondary | ICD-10-CM | POA: Diagnosis not present

## 2018-06-15 DIAGNOSIS — E1140 Type 2 diabetes mellitus with diabetic neuropathy, unspecified: Secondary | ICD-10-CM | POA: Diagnosis not present

## 2018-06-15 DIAGNOSIS — M6281 Muscle weakness (generalized): Secondary | ICD-10-CM | POA: Diagnosis not present

## 2018-06-15 DIAGNOSIS — F0390 Unspecified dementia without behavioral disturbance: Secondary | ICD-10-CM | POA: Diagnosis not present

## 2018-06-20 DIAGNOSIS — R41841 Cognitive communication deficit: Secondary | ICD-10-CM | POA: Diagnosis not present

## 2018-06-20 DIAGNOSIS — F0390 Unspecified dementia without behavioral disturbance: Secondary | ICD-10-CM | POA: Diagnosis not present

## 2018-06-20 DIAGNOSIS — M6281 Muscle weakness (generalized): Secondary | ICD-10-CM | POA: Diagnosis not present

## 2018-06-20 DIAGNOSIS — E1140 Type 2 diabetes mellitus with diabetic neuropathy, unspecified: Secondary | ICD-10-CM | POA: Diagnosis not present

## 2018-06-26 ENCOUNTER — Encounter: Payer: Self-pay | Admitting: Adult Health

## 2018-06-26 ENCOUNTER — Non-Acute Institutional Stay (SKILLED_NURSING_FACILITY): Payer: Medicare Other | Admitting: Adult Health

## 2018-06-26 DIAGNOSIS — E1169 Type 2 diabetes mellitus with other specified complication: Secondary | ICD-10-CM | POA: Diagnosis not present

## 2018-06-26 DIAGNOSIS — M810 Age-related osteoporosis without current pathological fracture: Secondary | ICD-10-CM | POA: Diagnosis not present

## 2018-06-26 DIAGNOSIS — N182 Chronic kidney disease, stage 2 (mild): Secondary | ICD-10-CM

## 2018-06-26 DIAGNOSIS — E1151 Type 2 diabetes mellitus with diabetic peripheral angiopathy without gangrene: Secondary | ICD-10-CM | POA: Diagnosis not present

## 2018-06-26 DIAGNOSIS — E1165 Type 2 diabetes mellitus with hyperglycemia: Secondary | ICD-10-CM

## 2018-06-26 DIAGNOSIS — F0391 Unspecified dementia with behavioral disturbance: Secondary | ICD-10-CM | POA: Diagnosis not present

## 2018-06-26 DIAGNOSIS — IMO0002 Reserved for concepts with insufficient information to code with codable children: Secondary | ICD-10-CM

## 2018-06-26 DIAGNOSIS — K219 Gastro-esophageal reflux disease without esophagitis: Secondary | ICD-10-CM | POA: Diagnosis not present

## 2018-06-26 DIAGNOSIS — E785 Hyperlipidemia, unspecified: Secondary | ICD-10-CM

## 2018-06-26 DIAGNOSIS — F339 Major depressive disorder, recurrent, unspecified: Secondary | ICD-10-CM | POA: Diagnosis not present

## 2018-06-26 DIAGNOSIS — E876 Hypokalemia: Secondary | ICD-10-CM

## 2018-06-26 DIAGNOSIS — I10 Essential (primary) hypertension: Secondary | ICD-10-CM | POA: Diagnosis not present

## 2018-06-26 DIAGNOSIS — R600 Localized edema: Secondary | ICD-10-CM

## 2018-06-26 DIAGNOSIS — E1122 Type 2 diabetes mellitus with diabetic chronic kidney disease: Secondary | ICD-10-CM | POA: Diagnosis not present

## 2018-06-26 NOTE — Progress Notes (Signed)
Location:   [facility] Room Number: [place] of Service:  SNF (31)   CODE STATUS: DNR  Allergies  Allergen Reactions  . Codeine Anaphylaxis and Hives  . Morphine And Related Anaphylaxis and Hives  . Penicillins Anaphylaxis  . Ace Inhibitors Cough    Chief Complaint  Patient presents with  . Medical Management of Chronic Issues    Essential hypertension; bilateral leg edema; hypokalemia.     HPI:  She is a 78 year old long term resident of this facility being seen for the management of her chronic illnesses: hypertension; edema; hypokalemia. There are on reports of worsening edema; no changes in appetite; no reports of anxiety or agitation. Her cbg readings remain elevated.   Past Medical History:  Diagnosis Date  . Allergy   . Atrial fibrillation ([place]) 08/2011   First diagnosed in 08/2011; duration of arrhythmia is uncertain  . Bilateral lower extremity edema   . Chronic diarrhea    diverticulosis  . COPD (chronic obstructive pulmonary disease) ([place])   . Dementia ([place])   . Diabetes mellitus, type 2 (HCC)    Diabetic neuropathy  . Dyspnea on exertion    pedal edema  . Gout   . Headache(784.0)    twice weekly  . Hyperlipidemia   . Hypertension   . Osteopenia    DEXA scan 01/2010  . Palpitations   . Seasonal allergies   . Stress incontinence   . Vertigo     Past Surgical History:  Procedure Laterality Date  . APPENDECTOMY    . BREAST BIOPSY  2002   Right  . CESAREAN SECTION     x 2  . CHOLECYSTECTOMY    . COLONOSCOPY  2007   Negative screening study  . COLONOSCOPY WITH PROPOFOL N/A 09/18/2017   Procedure: COLONOSCOPY WITH PROPOFOL;  Surgeon: Daneil Dolin, MD;  [facility];  Service: Endoscopy;  Laterality: N/A;  11:00am  . HAMMER TOE SURGERY     Bilateral hammer toe amputation  . INCISIONAL HERNIA REPAIR    . KNEE ARTHROSCOPY W/ MENISCAL REPAIR  2007   Bilateral  . POLYPECTOMY  09/18/2017   Procedure:  POLYPECTOMY;  Surgeon: Daneil Dolin, MD;  [facility];  Service: Endoscopy;;  colon  . UMBILICAL HERNIA REPAIR      Social History   Socioeconomic History  . Marital status: Widowed    Spouse name: Not on file  . Number of children: 2  . Years of education: Not on file  . Highest education level: Not on file  Occupational History  . Occupation: Pension scheme manager  . Financial resource strain: Not hard at all  . Food insecurity:    Worry: Never true    Inability: Never true  . Transportation needs:    Medical: No    Non-medical: No  Tobacco Use  . Smoking status: Never Smoker  . Smokeless tobacco: Never Used  Substance and Sexual Activity  . Alcohol use: No  . Drug use: No  . Sexual activity: Not on file  Lifestyle  . Physical activity:    Days per week: 0 days    Minutes per session: 0 min  . Stress: Not at all  Relationships  . Social connections:    Talks on phone: Never    Gets together: Never    Attends religious service: Never    Active member of club or organization: No  Attends meetings of clubs or organizations: Never    Relationship status: Widowed  . Intimate partner violence:    Fear of current or ex partner: No    Emotionally abused: No    Physically abused: No    Forced sexual activity: No  Other Topics Concern  . Not on file  Social History Narrative  . Not on file   Family History  Adopted: Yes      VITAL SIGNS BP 106/67   Pulse 70   Temp 98.4 F (36.9 C)   Resp 16   Ht 5\' 6"  (1.676 m)   Wt 195 lb 3.2 oz (88.5 kg)   SpO2 98%   BMI 31.51 kg/m   Outpatient Encounter Medications as of 06/26/2018  Medication Sig  . acetaminophen (TYLENOL) 325 MG tablet Take 650 mg by mouth every 6 (six) hours as needed.   Marland Kitchen alendronate (FOSAMAX) 70 MG tablet Give 1 tablet by mouth on Sunday. Take with a full glass of water on an empty stomach.  Marland Kitchen apixaban (ELIQUIS) 5 MG TABS tablet Take 5 mg by mouth 2 (two) times daily.    . carboxymethylcellulose (REFRESH PLUS) 0.5 % SOLN Place 1 drop into both eyes 4 (four) times daily.  . cholecalciferol (VITAMIN D) 1000 UNITS tablet Take 1,000 Units by mouth daily.  . citalopram (CELEXA) 20 MG tablet Take 1 tablet (20 mg total) by mouth daily.  Marland Kitchen Dextromethorphan-guaiFENesin (ROBITUSSIN DM PO) Take 15 mLs by mouth every 6 (six) hours as needed. May give up to 48 hours.  Inform physician immediately if cough or congestion is associated with fever or SOB  . digoxin (LANOXIN) 0.125 MG tablet Take 1 tablet by mouth once a day (HOLD FOR AP UNDER 60)  . furosemide (LASIX) 40 MG tablet Take 40 mg by mouth daily.   . insulin aspart (NOVOLOG FLEXPEN) 100 UNIT/ML FlexPen Inject subcutaneously two times daily with Lunch and Supper Per sliding scale: 151 - 250 = 12 units 251 - 350 = 14 units  . Insulin Glargine (BASAGLAR KWIKPEN) 100 UNIT/ML SOPN Inject 48 Units into the skin at bedtime.   . Insulin Pen Needle (EASY TOUCH PEN NEEDLES) 31G X 8 MM MISC Use twice a day  . memantine (NAMENDA) 10 MG tablet Take 10 mg by mouth 2 (two) times daily.  . metolazone (ZAROXOLYN) 2.5 MG tablet Take 5 mg by mouth daily. Mon. Fri   . metoprolol tartrate (LOPRESSOR) 25 MG tablet Take 75 mg by mouth 2 (two) times daily.  . Multiple Vitamin (MULTIVITAMIN WITH MINERALS) TABS Take 1 tablet by mouth every morning.  . NON FORMULARY Diet Type:  NAS, consistent CHO, Dysphagia 3 with thin liquids  Give Cranberry Juice two times daily at 9 am and 5 pm  . omeprazole (PRILOSEC) 40 MG capsule Take 40 mg by mouth daily.  . polyethylene glycol (MIRALAX / GLYCOLAX) packet Take 17 g by mouth daily as needed.   . potassium chloride (MICRO-K) 10 MEQ CR capsule Give 2 tablets (40 mg) by mouth every morning @ 8 am and Give 4 tablets (40 mg) by mouth every evening @ 6 pm  . pravastatin (PRAVACHOL) 20 MG tablet Take 20 mg by mouth daily. Take along with 40 mg to = 60 mg  . pravastatin (PRAVACHOL) 40 MG tablet Take 40 mg by  mouth daily. Take along with 20 mg to = 60 mg  . sitaGLIPtin-metformin (JANUMET) 50-500 MG tablet Take 1 tablet by mouth 2 (two) times daily  with a meal.   No facility-administered encounter medications on file as of 06/26/2018.      SIGNIFICANT DIAGNOSTIC EXAMS  LABS REVIEWED TODAY:   11-08-17: tsh 3.269 12-18-17: chol 149; ldl 87; trig 157; hdl 31  01-12-18: dig 0.9 04-26-18: wbc 7.9; hgb 11.7; hct 38.0; mcv 90.9; plt 241; glucose 147; bun 24; creat 1.03 ;k+ 3.6; na++ 138; ca 9.3  05-23-18: hgb a1c 8.0    Review of Systems  Unable to perform ROS: Dementia (unable to participate )    Physical Exam Constitutional:      General: She is not in acute distress.    Appearance: She is well-developed. She is not diaphoretic.  Neck:     Thyroid: No thyromegaly.  Cardiovascular:     Rate and Rhythm: Normal rate. Rhythm irregular.     Heart sounds: Normal heart sounds.  Pulmonary:     Effort: Pulmonary effort is normal. No respiratory distress.     Breath sounds: Normal breath sounds.  Abdominal:     General: Bowel sounds are normal. There is no distension.     Palpations: Abdomen is soft.     Tenderness: There is no abdominal tenderness.  Musculoskeletal:     Right lower leg: Edema present.     Left lower leg: Edema present.     Comments: Trace bilateral lower extremity Is able to move all extremities   Lymphadenopathy:     Cervical: No cervical adenopathy.  Skin:    General: Skin is warm and dry.  Neurological:     Mental Status: She is alert. Mental status is at baseline.  Psychiatric:        Mood and Affect: Mood normal.      ASSESSMENT/ PLAN:  TODAY:   1.  Essential hypertension: is stable b/p 106/67:will continue lopressor 75 mg twice daily will monitor  2. Chronic atrial fibrillation: heart rate stable: will continue digoxin 0.125 mg daily; lopressor 75 mg twice daily for rate control; eliquis 5 mg twice daily   3. Bilateral leg edema: is stable will continue  lasix 40 mg daily and zaroxolyn 5 mg on Monday and Fridays;   4. Hypokalemia: is stable k+ 3.8; will continue k+ 20 meq in the AM and 40 in the PM  5.  GERD without esophagitis: is stable will continue prilosec 40 mg daily   6. Type 2 diabetes uncontrolled with peripheral circulatory disorder: hgb a1c 8.0; CBGs are elevated: will continue basaglar 48 units nightly; junamet 50.500 mg twice daily; will change novolog to 16 units with meals hold for cbg <150  7. Dyslipidemia associated with type 2 diabetes: is stable LDL 87; will continue pravachol 60 mg daily   8. CKD stage 2 due to type 2 diabetes mellitus: is stable bun 24; creat 1.03  9. Unspecified dementia with behavorial disturbance: is without change weight is 195 pounds; will continue namenda 10 mg twice daily   10. Age related osteoporosis without current pathological fracture: is stable T score -4.4 (05/2016) will continue fosamax 70 mg weekly is on supplements   11. Major depression recurrent chronic: is emotionally stable will continue celexa 20 mg daily       MD is aware of resident's narcotic use and is in agreement with current plan of care. We will attempt to wean resident as apropriate   Ok Edwards NP [facility]  Contact [phone] Monday through Friday 8am- 5pm  After hours call [phone]

## 2018-07-02 DIAGNOSIS — F339 Major depressive disorder, recurrent, unspecified: Secondary | ICD-10-CM | POA: Insufficient documentation

## 2018-07-02 DIAGNOSIS — K219 Gastro-esophageal reflux disease without esophagitis: Secondary | ICD-10-CM | POA: Insufficient documentation

## 2018-07-02 DIAGNOSIS — E1169 Type 2 diabetes mellitus with other specified complication: Secondary | ICD-10-CM | POA: Insufficient documentation

## 2018-07-02 DIAGNOSIS — E785 Hyperlipidemia, unspecified: Secondary | ICD-10-CM

## 2018-07-02 DIAGNOSIS — E876 Hypokalemia: Secondary | ICD-10-CM | POA: Insufficient documentation

## 2018-07-02 DIAGNOSIS — N182 Chronic kidney disease, stage 2 (mild): Secondary | ICD-10-CM

## 2018-07-02 DIAGNOSIS — E1122 Type 2 diabetes mellitus with diabetic chronic kidney disease: Secondary | ICD-10-CM | POA: Insufficient documentation

## 2018-07-25 ENCOUNTER — Encounter: Payer: Self-pay | Admitting: Internal Medicine

## 2018-07-25 NOTE — Progress Notes (Signed)
Entered in error

## 2018-07-25 NOTE — Progress Notes (Deleted)
Location:  [place] Room Number: [place] of Service:  SNF [phone]) Provider:  Veleta Miners, MD  Celedonio Savage, MD  Patient Care Team: Celedonio Savage, MD as PCP - General (Family Medicine) Rothbart, Cristopher Estimable, MD (Cardiology) Lendon Colonel, NP as Nurse Practitioner (Nurse Practitioner)  Extended Emergency Contact Information Primary Emergency Contact: [facility] Address: [address]          [place], [name] [address] [name] of [place] Phone: [phone] Mobile Phone: [phone] Relation: Daughter  Code Status:  DNR Goals of care: Advanced Directive information Advanced Directives 07/25/2018  Does Patient Have a Medical Advance Directive? Yes  Type of Advance Directive Out of facility DNR (pink MOST or yellow form)  Does patient want to make changes to medical advance directive? No - Patient declined  Copy of [place] in Chart? -  Pre-existing out of facility DNR order (yellow form or pink MOST form) Yellow form placed in chart (order not valid for inpatient use)     Chief Complaint  Patient presents with  . Medical Management of Chronic Issues    Hypertension, Diabetes Mellitus Type 2    HPI:  Pt is a 78 y.o. [place] [name] seen today for medical management of chronic diseases.     Past Medical History:  Diagnosis Date  . Allergy   . Atrial fibrillation ([place]) 08/2011   First diagnosed in 08/2011; duration of arrhythmia is uncertain  . Bilateral lower extremity edema   . Chronic diarrhea    diverticulosis  . COPD (chronic obstructive pulmonary disease) ([place])   . Dementia ([place])   . Diabetes mellitus, type 2 (HCC)    Diabetic neuropathy  . Dyspnea on exertion    pedal edema  . Gout   . Headache(784.0)    twice weekly  . Hyperlipidemia   . Hypertension   . Osteopenia    DEXA scan 01/2010  . Palpitations   . Seasonal allergies   . Stress incontinence   . Vertigo    Past Surgical History:  Procedure  Laterality Date  . APPENDECTOMY    . BREAST BIOPSY  2002   Right  . CESAREAN SECTION     x 2  . CHOLECYSTECTOMY    . COLONOSCOPY  2007   Negative screening study  . COLONOSCOPY WITH PROPOFOL N/A 09/18/2017   Procedure: COLONOSCOPY WITH PROPOFOL;  Surgeon: Daneil Dolin, MD;  [facility];  Service: Endoscopy;  Laterality: N/A;  11:00am  . HAMMER TOE SURGERY     Bilateral hammer toe amputation  . INCISIONAL HERNIA REPAIR    . KNEE ARTHROSCOPY W/ MENISCAL REPAIR  2007   Bilateral  . POLYPECTOMY  09/18/2017   Procedure: POLYPECTOMY;  Surgeon: Daneil Dolin, MD;  [facility];  Service: Endoscopy;;  colon  . UMBILICAL HERNIA REPAIR      Allergies  Allergen Reactions  . Codeine Anaphylaxis and Hives  . Morphine And Related Anaphylaxis and Hives  . Penicillins Anaphylaxis  . Ace Inhibitors Cough    Outpatient Encounter Medications as of 07/25/2018  Medication Sig  . acetaminophen (TYLENOL) 325 MG tablet Take 650 mg by mouth every 6 (six) hours as needed.   Marland Kitchen alendronate (FOSAMAX) 70 MG tablet Give 1 tablet by mouth on Sunday. Take with a full glass of water on an empty stomach.  Marland Kitchen apixaban (ELIQUIS) 5 MG TABS tablet Take 5 mg by mouth 2 (two) times daily.   Marland Kitchen  carboxymethylcellulose (REFRESH PLUS) 0.5 % SOLN Place 1 drop into both eyes 4 (four) times daily.  . cholecalciferol (VITAMIN D) 1000 UNITS tablet Take 1,000 Units by mouth daily.  . citalopram (CELEXA) 20 MG tablet Take 1 tablet (20 mg total) by mouth daily.  Marland Kitchen Dextromethorphan-guaiFENesin (ROBITUSSIN DM PO) Take 15 mLs by mouth every 6 (six) hours as needed. May give up to 48 hours.  Inform physician immediately if cough or congestion is associated with fever or SOB  . digoxin (LANOXIN) 0.125 MG tablet Take 1 tablet by mouth once a day (HOLD FOR AP UNDER 60)  . furosemide (LASIX) 40 MG tablet Take 40 mg by mouth daily.   . insulin aspart (NOVOLOG FLEXPEN) 100 UNIT/ML FlexPen Inject 16 Units into the  skin 3 (three) times daily with meals.   . Insulin Glargine (BASAGLAR KWIKPEN) 100 UNIT/ML SOPN Inject 48 Units into the skin at bedtime.   . Insulin Pen Needle (EASY TOUCH PEN NEEDLES) 31G X 8 MM MISC Use twice a day  . memantine (NAMENDA) 10 MG tablet Take 10 mg by mouth 2 (two) times daily.  . metolazone (ZAROXOLYN) 2.5 MG tablet Take 5 mg by mouth daily. Mon. Fri   . metoprolol tartrate (LOPRESSOR) 25 MG tablet Take 75 mg by mouth 2 (two) times daily.  . Multiple Vitamin (MULTIVITAMIN WITH MINERALS) TABS Take 1 tablet by mouth every morning.  . NON FORMULARY Diet Type:  NAS, consistent CHO, Dysphagia 3 with thin liquids  Give Cranberry Juice two times daily at 9 am and 5 pm  . omeprazole (PRILOSEC) 40 MG capsule Take 40 mg by mouth daily.  . polyethylene glycol (MIRALAX / GLYCOLAX) packet Take 17 g by mouth daily as needed.   . potassium chloride (MICRO-K) 10 MEQ CR capsule Give 2 tablets (40 mg) by mouth every morning @ 8 am and Give 4 tablets (40 mg) by mouth every evening @ 6 pm  . pravastatin (PRAVACHOL) 20 MG tablet Take 20 mg by mouth daily. Take along with 40 mg to = 60 mg  . pravastatin (PRAVACHOL) 40 MG tablet Take 40 mg by mouth daily. Take along with 20 mg to = 60 mg  . sitaGLIPtin-metformin (JANUMET) 50-500 MG tablet Take 1 tablet by mouth 2 (two) times daily with a meal.   No facility-administered encounter medications on file as of 07/25/2018.     Review of Systems  Immunization History  Administered Date(s) Administered  . Influenza-Unspecified 02/04/2016, 02/03/2017, 02/01/2018  . Pneumococcal Conjugate-13 02/03/2017  . Pneumococcal-Unspecified 02/08/2016  . Tdap 01/19/2017   Pertinent  Health Maintenance Due  Topic Date Due  . URINE MICROALBUMIN  08/23/2018  . FOOT EXAM  10/31/2018  . HEMOGLOBIN A1C  11/21/2018  . OPHTHALMOLOGY EXAM  03/07/2019  . INFLUENZA VACCINE  Completed  . DEXA SCAN  Completed  . PNA vac Low Risk Adult  Completed   Fall Risk  01/30/2018  01/19/2017 11/06/2015 09/29/2015 08/31/2015  Falls in the past year? No No Yes No No  Number falls in past yr: - - 2 or more - -  Injury with Fall? - - No - -   Functional Status Survey:    Vitals:   07/25/18 1424  Weight: 195 lb (88.5 kg)  Height: 5\' 6"  (1.676 m)   Body mass index is 31.47 kg/m. Physical Exam  Labs reviewed: Recent Labs    01/02/18 0700 01/23/18 0733 03/20/18 1740 04/26/18 0740  NA 139 140  --  138  K  3.5 3.8  --  3.6  CL 98 102  --  101  CO2 28 29  --  28  GLUCOSE 178* 194*  --  147*  BUN 25* 21  --  24*  CREATININE 1.25* 1.09* 1.10* 1.03*  CALCIUM 9.3 8.8*  --  9.3   Recent Labs    08/08/17 1459 11/08/17 1625  AST 21 25  ALT 15 19  ALKPHOS 54 52  BILITOT 0.5 0.5  PROT 7.5 7.9  ALBUMIN 3.3* 3.3*   Recent Labs    11/08/17 1625 11/27/17 0700 12/18/17 0700 04/26/18 0740  WBC 9.8 9.7 9.1 7.9  NEUTROABS 5.6 5.3  --  4.8  HGB 11.9* 12.3 11.8* 11.7*  HCT 38.2 39.6 38.1 38.0  MCV 90.5 89.0 89.6 90.9  PLT 248 233 217 241   Lab Results  Component Value Date   TSH 3.269 11/08/2017   Lab Results  Component Value Date   HGBA1C 8.0 (H) 05/23/2018   Lab Results  Component Value Date   CHOL 149 12/18/2017   HDL 31 (L) 12/18/2017   LDLCALC 87 12/18/2017   TRIG 157 (H) 12/18/2017   CHOLHDL 4.8 12/18/2017    Significant Diagnostic Results in last 30 days:  No results found.  Assessment/Plan There are no diagnoses linked to this encounter.   Family/ staff Communication: ***  Labs/tests ordered:  ***

## 2018-07-26 ENCOUNTER — Non-Acute Institutional Stay (SKILLED_NURSING_FACILITY): Payer: Medicare Other | Admitting: Internal Medicine

## 2018-07-26 ENCOUNTER — Encounter: Payer: Self-pay | Admitting: Internal Medicine

## 2018-07-26 DIAGNOSIS — E785 Hyperlipidemia, unspecified: Secondary | ICD-10-CM | POA: Diagnosis not present

## 2018-07-26 DIAGNOSIS — I482 Chronic atrial fibrillation, unspecified: Secondary | ICD-10-CM | POA: Diagnosis not present

## 2018-07-26 DIAGNOSIS — E1151 Type 2 diabetes mellitus with diabetic peripheral angiopathy without gangrene: Secondary | ICD-10-CM

## 2018-07-26 DIAGNOSIS — E1169 Type 2 diabetes mellitus with other specified complication: Secondary | ICD-10-CM

## 2018-07-26 DIAGNOSIS — I10 Essential (primary) hypertension: Secondary | ICD-10-CM

## 2018-07-26 DIAGNOSIS — IMO0002 Reserved for concepts with insufficient information to code with codable children: Secondary | ICD-10-CM

## 2018-07-26 DIAGNOSIS — F0391 Unspecified dementia with behavioral disturbance: Secondary | ICD-10-CM | POA: Diagnosis not present

## 2018-07-26 DIAGNOSIS — E1165 Type 2 diabetes mellitus with hyperglycemia: Secondary | ICD-10-CM | POA: Diagnosis not present

## 2018-07-26 NOTE — Progress Notes (Signed)
Location:  [place] Room Number: [place] of Service:  SNF [phone]) Provider:  Veleta Miners, MD  Celedonio Savage, MD  Patient Care Team: Celedonio Savage, MD as PCP - General (Family Medicine) Rothbart, Cristopher Estimable, MD (Cardiology) Lendon Colonel, NP as Nurse Practitioner (Nurse Practitioner)  Extended Emergency Contact Information Primary Emergency Contact: [facility] Address: [address]          [place], [place] [address] [name] of [place] Phone: [phone] Mobile Phone: [phone] Relation: Daughter  Code Status:  DNR Goals of care: Advanced Directive information Advanced Directives 07/26/2018  Does Patient Have a Medical Advance Directive? Yes  Type of Advance Directive Out of facility DNR (pink MOST or yellow form)  Does patient want to make changes to medical advance directive? No - Patient declined  Copy of [place] in Chart? -  Pre-existing out of facility DNR order (yellow form or pink MOST form) Yellow form placed in chart (order not valid for inpatient use)     Chief Complaint  Patient presents with  . Medical Management of Chronic Issues    Hypertension, Diabetes Type 2    HPI:  Pt is a 78 y.o. female seen today for medical management of chronic diseases.   Patient has h/o Diabetes mellitus type 2 , Atrial fibrillation on chronic anticoagulation, Venous stasis, Dementia with Behavior problems, Recurrent Cellulitis oFRightLE.and r GI bleed s/p Colonoscopy with Polypectomy and Recurrent Falls Patient has been stable in facility. She has no new nursing issues Weight is stable Appetite is good. Continues to be full care due to dementia No Recent falls   Past Medical History:  Diagnosis Date  . Allergy   . Atrial fibrillation ([place]) 08/2011   First diagnosed in 08/2011; duration of arrhythmia is uncertain  . Bilateral lower extremity edema   . Chronic diarrhea    diverticulosis  . COPD (chronic  obstructive pulmonary disease) ([place])   . Dementia ([place])   . Diabetes mellitus, type 2 (HCC)    Diabetic neuropathy  . Dyspnea on exertion    pedal edema  . Gout   . Headache(784.0)    twice weekly  . Hyperlipidemia   . Hypertension   . Osteopenia    DEXA scan 01/2010  . Palpitations   . Seasonal allergies   . Stress incontinence   . Vertigo    Past Surgical History:  Procedure Laterality Date  . APPENDECTOMY    . BREAST BIOPSY  2002   Right  . CESAREAN SECTION     x 2  . CHOLECYSTECTOMY    . COLONOSCOPY  2007   Negative screening study  . COLONOSCOPY WITH PROPOFOL N/A 09/18/2017   Procedure: COLONOSCOPY WITH PROPOFOL;  Surgeon: Daneil Dolin, MD;  [facility];  Service: Endoscopy;  Laterality: N/A;  11:00am  . HAMMER TOE SURGERY     Bilateral hammer toe amputation  . INCISIONAL HERNIA REPAIR    . KNEE ARTHROSCOPY W/ MENISCAL REPAIR  2007   Bilateral  . POLYPECTOMY  09/18/2017   Procedure: POLYPECTOMY;  Surgeon: Daneil Dolin, MD;  [facility];  Service: Endoscopy;;  colon  . UMBILICAL HERNIA REPAIR      Allergies  Allergen Reactions  . Codeine Anaphylaxis and Hives  . Morphine And Related Anaphylaxis and Hives  . Penicillins Anaphylaxis  . Ace Inhibitors Cough    Outpatient Encounter Medications as of 07/26/2018  Medication Sig  . acetaminophen (TYLENOL)  325 MG tablet Take 650 mg by mouth every 6 (six) hours as needed.   Marland Kitchen alendronate (FOSAMAX) 70 MG tablet Give 1 tablet by mouth on Sunday. Take with a full glass of water on an empty stomach.  Marland Kitchen apixaban (ELIQUIS) 5 MG TABS tablet Take 5 mg by mouth 2 (two) times daily.   . carboxymethylcellulose (REFRESH PLUS) 0.5 % SOLN Place 1 drop into both eyes 4 (four) times daily.  . cholecalciferol (VITAMIN D) 1000 UNITS tablet Take 1,000 Units by mouth daily.  . citalopram (CELEXA) 20 MG tablet Take 1 tablet (20 mg total) by mouth daily.  Marland Kitchen Dextromethorphan-guaiFENesin (ROBITUSSIN DM PO) Take  15 mLs by mouth every 6 (six) hours as needed. May give up to 48 hours.  Inform physician immediately if cough or congestion is associated with fever or SOB  . digoxin (LANOXIN) 0.125 MG tablet Take 1 tablet by mouth once a day (HOLD FOR AP UNDER 60)  . furosemide (LASIX) 40 MG tablet Take 40 mg by mouth daily.   . insulin aspart (NOVOLOG FLEXPEN) 100 UNIT/ML FlexPen Inject 16 Units into the skin 3 (three) times daily with meals.   . Insulin Glargine (BASAGLAR KWIKPEN) 100 UNIT/ML SOPN Inject 48 Units into the skin at bedtime.   . Insulin Pen Needle (EASY TOUCH PEN NEEDLES) 31G X 8 MM MISC Use twice a day  . memantine (NAMENDA) 10 MG tablet Take 10 mg by mouth 2 (two) times daily.  . metolazone (ZAROXOLYN) 2.5 MG tablet Take 5 mg by mouth daily. Mon. Fri   . metoprolol tartrate (LOPRESSOR) 25 MG tablet Take 75 mg by mouth 2 (two) times daily.  . Multiple Vitamin (MULTIVITAMIN WITH MINERALS) TABS Take 1 tablet by mouth every morning.  . NON FORMULARY Diet Type:  NAS, consistent CHO, Dysphagia 3 with thin liquids  Give Cranberry Juice two times daily at 9 am and 5 pm  . omeprazole (PRILOSEC) 40 MG capsule Take 40 mg by mouth daily.  . polyethylene glycol (MIRALAX / GLYCOLAX) packet Take 17 g by mouth daily as needed.   . potassium chloride (MICRO-K) 10 MEQ CR capsule Give 2 tablets (40 mg) by mouth every morning @ 8 am and Give 4 tablets (40 mg) by mouth every evening @ 6 pm  . pravastatin (PRAVACHOL) 20 MG tablet Take 20 mg by mouth daily. Take along with 40 mg to = 60 mg  . pravastatin (PRAVACHOL) 40 MG tablet Take 40 mg by mouth daily. Take along with 20 mg to = 60 mg  . sitaGLIPtin-metformin (JANUMET) 50-500 MG tablet Take 1 tablet by mouth 2 (two) times daily with a meal.   No facility-administered encounter medications on file as of 07/26/2018.     Review of Systems  Unable to perform ROS: Dementia    Immunization History  Administered Date(s) Administered  . Influenza-Unspecified  02/04/2016, 02/03/2017, 02/01/2018  . Pneumococcal Conjugate-13 02/03/2017  . Pneumococcal-Unspecified 02/08/2016  . Tdap 01/19/2017   Pertinent  Health Maintenance Due  Topic Date Due  . URINE MICROALBUMIN  08/23/2018  . FOOT EXAM  10/31/2018  . HEMOGLOBIN A1C  11/21/2018  . OPHTHALMOLOGY EXAM  03/07/2019  . INFLUENZA VACCINE  Completed  . DEXA SCAN  Completed  . PNA vac Low Risk Adult  Completed   Fall Risk  01/30/2018 01/19/2017 11/06/2015 09/29/2015 08/31/2015  Falls in the past year? No No Yes No No  Number falls in past yr: - - 2 or more - -  Injury  with Fall? - - No - -   Functional Status Survey:    Vitals:   07/26/18 0925  BP: (!) 126/58  Pulse: 73  Resp: 20  Temp: 97.6 F (36.4 C)  Weight: 153 lb 6.4 oz (69.6 kg)  Height: 5\' 8"  (1.727 m)   Body mass index is 23.32 kg/m. Physical Exam Constitutional:      Appearance: She is well-developed.  HENT:     Head: Normocephalic.  Eyes:     Pupils: Pupils are equal, round, and reactive to light.  Neck:     Musculoskeletal: Neck supple.  Cardiovascular:     Rate and Rhythm: Normal rate. Rhythm irregular.     Heart sounds: No murmur.  Pulmonary:     Effort: Pulmonary effort is normal. No respiratory distress.     Breath sounds: Normal breath sounds. No wheezing.  Abdominal:     General: There is no distension.     Palpations: Abdomen is soft.     Tenderness: There is no abdominal tenderness. There is no guarding.  Musculoskeletal:     Comments: Bilateral Edema Right More then Left.With Some redness in the Posterior side of Right LE   Skin:    General: Skin is warm and dry.     Comments: Has small skin tag on her Right hand and Left Leg  Neurological:     Mental Status: She is alert.     Comments: No Neuro Deficits. Not oriented. Walks with Assist but mostly wheelchair dependent  Psychiatric:        Behavior: Behavior normal.        Thought Content: Thought content normal.     Labs reviewed: Recent Labs     01/02/18 0700 01/23/18 0733 03/20/18 1740 04/26/18 0740  NA 139 140  --  138  K 3.5 3.8  --  3.6  CL 98 102  --  101  CO2 28 29  --  28  GLUCOSE 178* 194*  --  147*  BUN 25* 21  --  24*  CREATININE 1.25* 1.09* 1.10* 1.03*  CALCIUM 9.3 8.8*  --  9.3   Recent Labs    08/08/17 1459 11/08/17 1625  AST 21 25  ALT 15 19  ALKPHOS 54 52  BILITOT 0.5 0.5  PROT 7.5 7.9  ALBUMIN 3.3* 3.3*   Recent Labs    11/08/17 1625 11/27/17 0700 12/18/17 0700 04/26/18 0740  WBC 9.8 9.7 9.1 7.9  NEUTROABS 5.6 5.3  --  4.8  HGB 11.9* 12.3 11.8* 11.7*  HCT 38.2 39.6 38.1 38.0  MCV 90.5 89.0 89.6 90.9  PLT 248 233 217 241   Lab Results  Component Value Date   TSH 3.269 11/08/2017   Lab Results  Component Value Date   HGBA1C 8.0 (H) 05/23/2018   Lab Results  Component Value Date   CHOL 149 12/18/2017   HDL 31 (L) 12/18/2017   LDLCALC 87 12/18/2017   TRIG 157 (H) 12/18/2017   CHOLHDL 4.8 12/18/2017    Significant Diagnostic Results in last 30 days:  No results found.  Assessment/Plan Essential hypertension BP stable on Lopressor,Lasix and Metolazone  Chronic A. 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 Hgb stable Health Maintenance Will give her Zoster Vaccination   Family/ staff Communication:   Labs/tests ordered:  BMP,CBC,HBA1C, Lipid Panel   Total time spent in this patient care encounter was 25_ minutes; greater than 50% of the visit spent, reviewing records , Labs and coordinating care for problems addressed at this encounter.

## 2018-08-09 ENCOUNTER — Encounter (HOSPITAL_COMMUNITY)
Admission: RE | Admit: 2018-08-09 | Discharge: 2018-08-09 | Disposition: A | Discharge status: Home / Self Care | Payer: Medicare Other | Source: Skilled Nursing Facility | Attending: Internal Medicine | Admitting: Internal Medicine

## 2018-08-09 DIAGNOSIS — N182 Chronic kidney disease, stage 2 (mild): Secondary | ICD-10-CM | POA: Diagnosis not present

## 2018-08-09 DIAGNOSIS — E1169 Type 2 diabetes mellitus with other specified complication: Secondary | ICD-10-CM | POA: Insufficient documentation

## 2018-08-09 DIAGNOSIS — I110 Hypertensive heart disease with heart failure: Secondary | ICD-10-CM | POA: Insufficient documentation

## 2018-08-09 DIAGNOSIS — E1140 Type 2 diabetes mellitus with diabetic neuropathy, unspecified: Secondary | ICD-10-CM | POA: Diagnosis not present

## 2018-08-09 DIAGNOSIS — E1122 Type 2 diabetes mellitus with diabetic chronic kidney disease: Secondary | ICD-10-CM | POA: Insufficient documentation

## 2018-08-09 DIAGNOSIS — F339 Major depressive disorder, recurrent, unspecified: Secondary | ICD-10-CM | POA: Insufficient documentation

## 2018-08-09 DIAGNOSIS — Z7901 Long term (current) use of anticoagulants: Secondary | ICD-10-CM | POA: Diagnosis not present

## 2018-08-09 DIAGNOSIS — Z794 Long term (current) use of insulin: Secondary | ICD-10-CM | POA: Insufficient documentation

## 2018-08-09 DIAGNOSIS — E1165 Type 2 diabetes mellitus with hyperglycemia: Secondary | ICD-10-CM | POA: Insufficient documentation

## 2018-08-09 DIAGNOSIS — F0390 Unspecified dementia without behavioral disturbance: Secondary | ICD-10-CM | POA: Diagnosis not present

## 2018-08-09 DIAGNOSIS — E1151 Type 2 diabetes mellitus with diabetic peripheral angiopathy without gangrene: Secondary | ICD-10-CM | POA: Insufficient documentation

## 2018-08-09 DIAGNOSIS — I5042 Chronic combined systolic (congestive) and diastolic (congestive) heart failure: Secondary | ICD-10-CM | POA: Insufficient documentation

## 2018-08-09 DIAGNOSIS — E785 Hyperlipidemia, unspecified: Secondary | ICD-10-CM | POA: Diagnosis not present

## 2018-08-09 DIAGNOSIS — R41841 Cognitive communication deficit: Secondary | ICD-10-CM | POA: Diagnosis not present

## 2018-08-09 DIAGNOSIS — K5900 Constipation, unspecified: Secondary | ICD-10-CM | POA: Insufficient documentation

## 2018-08-09 DIAGNOSIS — K219 Gastro-esophageal reflux disease without esophagitis: Secondary | ICD-10-CM | POA: Insufficient documentation

## 2018-08-09 DIAGNOSIS — Z79899 Other long term (current) drug therapy: Secondary | ICD-10-CM | POA: Diagnosis not present

## 2018-08-09 LAB — CBC WITH DIFFERENTIAL/PLATELET
Abs Immature Granulocytes: 0.04 10*3/uL (ref 0.00–0.07)
Basophils Absolute: 0.1 10*3/uL (ref 0.0–0.1)
Basophils Relative: 1 %
Eosinophils Absolute: 0.5 10*3/uL (ref 0.0–0.5)
Eosinophils Relative: 5 %
HCT: 39.2 % (ref 36.0–46.0)
Hemoglobin: 12.1 g/dL (ref 12.0–15.0)
Immature Granulocytes: 0 %
Lymphocytes Relative: 24 %
Lymphs Abs: 2.5 10*3/uL (ref 0.7–4.0)
MCH: 28.5 pg (ref 26.0–34.0)
MCHC: 30.9 g/dL (ref 30.0–36.0)
MCV: 92.2 fL (ref 80.0–100.0)
Monocytes Absolute: 0.9 10*3/uL (ref 0.1–1.0)
Monocytes Relative: 9 %
Neutro Abs: 6.4 10*3/uL (ref 1.7–7.7)
Neutrophils Relative %: 61 %
Platelets: 242 10*3/uL (ref 150–400)
RBC: 4.25 MIL/uL (ref 3.87–5.11)
RDW: 13.8 % (ref 11.5–15.5)
WBC: 10.3 10*3/uL (ref 4.0–10.5)
nRBC: 0 % (ref 0.0–0.2)

## 2018-08-09 LAB — LIPID PANEL
Cholesterol: 148 mg/dL (ref 0–200)
HDL: 32 mg/dL — ABNORMAL LOW (ref >40)
LDL Cholesterol: 95 mg/dL (ref 0–99)
Total CHOL/HDL Ratio: 4.6 RATIO
Triglycerides: 107 mg/dL (ref <150)
VLDL: 21 mg/dL (ref 0–40)

## 2018-08-09 LAB — BASIC METABOLIC PANEL
Anion gap: 10 (ref 5–15)
BUN: 31 mg/dL — ABNORMAL HIGH (ref 8–23)
CO2: 32 mmol/L (ref 22–32)
Calcium: 9.3 mg/dL (ref 8.9–10.3)
Chloride: 99 mmol/L (ref 98–111)
Creatinine, Ser: 1.08 mg/dL — ABNORMAL HIGH (ref 0.44–1.00)
GFR calc Af Amer: 57 mL/min — ABNORMAL LOW (ref >60)
GFR calc non Af Amer: 49 mL/min — ABNORMAL LOW (ref >60)
Glucose, Bld: 86 mg/dL (ref 70–99)
Potassium: 3.2 mmol/L — ABNORMAL LOW (ref 3.5–5.1)
Sodium: 141 mmol/L (ref 135–145)

## 2018-08-09 LAB — HEMOGLOBIN A1C
Hgb A1c MFr Bld: 7.9 % — ABNORMAL HIGH (ref 4.8–5.6)
Mean Plasma Glucose: 180.03 mg/dL

## 2018-08-17 DIAGNOSIS — R262 Difficulty in walking, not elsewhere classified: Secondary | ICD-10-CM | POA: Diagnosis not present

## 2018-08-17 DIAGNOSIS — J449 Chronic obstructive pulmonary disease, unspecified: Secondary | ICD-10-CM | POA: Diagnosis not present

## 2018-08-17 DIAGNOSIS — E1140 Type 2 diabetes mellitus with diabetic neuropathy, unspecified: Secondary | ICD-10-CM | POA: Diagnosis not present

## 2018-08-17 DIAGNOSIS — M6281 Muscle weakness (generalized): Secondary | ICD-10-CM | POA: Diagnosis not present

## 2018-08-17 DIAGNOSIS — I480 Paroxysmal atrial fibrillation: Secondary | ICD-10-CM | POA: Diagnosis not present

## 2018-08-20 DIAGNOSIS — E1140 Type 2 diabetes mellitus with diabetic neuropathy, unspecified: Secondary | ICD-10-CM | POA: Diagnosis not present

## 2018-08-20 DIAGNOSIS — I480 Paroxysmal atrial fibrillation: Secondary | ICD-10-CM | POA: Diagnosis not present

## 2018-08-20 DIAGNOSIS — R262 Difficulty in walking, not elsewhere classified: Secondary | ICD-10-CM | POA: Diagnosis not present

## 2018-08-20 DIAGNOSIS — M6281 Muscle weakness (generalized): Secondary | ICD-10-CM | POA: Diagnosis not present

## 2018-08-20 DIAGNOSIS — J449 Chronic obstructive pulmonary disease, unspecified: Secondary | ICD-10-CM | POA: Diagnosis not present

## 2018-08-21 DIAGNOSIS — M6281 Muscle weakness (generalized): Secondary | ICD-10-CM | POA: Diagnosis not present

## 2018-08-21 DIAGNOSIS — E1140 Type 2 diabetes mellitus with diabetic neuropathy, unspecified: Secondary | ICD-10-CM | POA: Diagnosis not present

## 2018-08-21 DIAGNOSIS — J449 Chronic obstructive pulmonary disease, unspecified: Secondary | ICD-10-CM | POA: Diagnosis not present

## 2018-08-21 DIAGNOSIS — I480 Paroxysmal atrial fibrillation: Secondary | ICD-10-CM | POA: Diagnosis not present

## 2018-08-21 DIAGNOSIS — R262 Difficulty in walking, not elsewhere classified: Secondary | ICD-10-CM | POA: Diagnosis not present

## 2018-08-22 DIAGNOSIS — E1140 Type 2 diabetes mellitus with diabetic neuropathy, unspecified: Secondary | ICD-10-CM | POA: Diagnosis not present

## 2018-08-22 DIAGNOSIS — I480 Paroxysmal atrial fibrillation: Secondary | ICD-10-CM | POA: Diagnosis not present

## 2018-08-22 DIAGNOSIS — R262 Difficulty in walking, not elsewhere classified: Secondary | ICD-10-CM | POA: Diagnosis not present

## 2018-08-22 DIAGNOSIS — J449 Chronic obstructive pulmonary disease, unspecified: Secondary | ICD-10-CM | POA: Diagnosis not present

## 2018-08-22 DIAGNOSIS — M6281 Muscle weakness (generalized): Secondary | ICD-10-CM | POA: Diagnosis not present

## 2018-08-23 ENCOUNTER — Encounter (HOSPITAL_COMMUNITY)
Admission: RE | Admit: 2018-08-23 | Discharge: 2018-08-23 | Disposition: A | Discharge status: Home / Self Care | Payer: Medicare Other | Source: Skilled Nursing Facility | Attending: Internal Medicine | Admitting: Internal Medicine

## 2018-08-23 DIAGNOSIS — E1169 Type 2 diabetes mellitus with other specified complication: Secondary | ICD-10-CM | POA: Diagnosis not present

## 2018-08-23 DIAGNOSIS — E1151 Type 2 diabetes mellitus with diabetic peripheral angiopathy without gangrene: Secondary | ICD-10-CM | POA: Diagnosis not present

## 2018-08-23 DIAGNOSIS — I110 Hypertensive heart disease with heart failure: Secondary | ICD-10-CM | POA: Diagnosis not present

## 2018-08-23 DIAGNOSIS — E1165 Type 2 diabetes mellitus with hyperglycemia: Secondary | ICD-10-CM | POA: Diagnosis not present

## 2018-08-23 DIAGNOSIS — I5042 Chronic combined systolic (congestive) and diastolic (congestive) heart failure: Secondary | ICD-10-CM | POA: Diagnosis not present

## 2018-08-23 DIAGNOSIS — E1140 Type 2 diabetes mellitus with diabetic neuropathy, unspecified: Secondary | ICD-10-CM | POA: Diagnosis not present

## 2018-08-23 LAB — BASIC METABOLIC PANEL
Anion gap: 7 (ref 5–15)
BUN: 30 mg/dL — ABNORMAL HIGH (ref 8–23)
CO2: 30 mmol/L (ref 22–32)
Calcium: 9.1 mg/dL (ref 8.9–10.3)
Chloride: 105 mmol/L (ref 98–111)
Creatinine, Ser: 0.99 mg/dL (ref 0.44–1.00)
GFR calc Af Amer: >60 mL/min (ref >60)
GFR calc non Af Amer: 55 mL/min — ABNORMAL LOW (ref >60)
Glucose, Bld: 174 mg/dL — ABNORMAL HIGH (ref 70–99)
Potassium: 3.6 mmol/L (ref 3.5–5.1)
Sodium: 142 mmol/L (ref 135–145)

## 2018-08-27 ENCOUNTER — Non-Acute Institutional Stay (SKILLED_NURSING_FACILITY): Payer: Medicare Other | Admitting: Adult Health

## 2018-08-27 ENCOUNTER — Encounter: Payer: Self-pay | Admitting: Adult Health

## 2018-08-27 DIAGNOSIS — I480 Paroxysmal atrial fibrillation: Secondary | ICD-10-CM | POA: Diagnosis not present

## 2018-08-27 DIAGNOSIS — E1122 Type 2 diabetes mellitus with diabetic chronic kidney disease: Secondary | ICD-10-CM | POA: Diagnosis not present

## 2018-08-27 DIAGNOSIS — E785 Hyperlipidemia, unspecified: Secondary | ICD-10-CM | POA: Diagnosis not present

## 2018-08-27 DIAGNOSIS — J449 Chronic obstructive pulmonary disease, unspecified: Secondary | ICD-10-CM | POA: Diagnosis not present

## 2018-08-27 DIAGNOSIS — E1151 Type 2 diabetes mellitus with diabetic peripheral angiopathy without gangrene: Secondary | ICD-10-CM

## 2018-08-27 DIAGNOSIS — E1165 Type 2 diabetes mellitus with hyperglycemia: Secondary | ICD-10-CM | POA: Diagnosis not present

## 2018-08-27 DIAGNOSIS — E1169 Type 2 diabetes mellitus with other specified complication: Secondary | ICD-10-CM

## 2018-08-27 DIAGNOSIS — N182 Chronic kidney disease, stage 2 (mild): Secondary | ICD-10-CM | POA: Diagnosis not present

## 2018-08-27 DIAGNOSIS — IMO0002 Reserved for concepts with insufficient information to code with codable children: Secondary | ICD-10-CM

## 2018-08-27 DIAGNOSIS — M6281 Muscle weakness (generalized): Secondary | ICD-10-CM | POA: Diagnosis not present

## 2018-08-27 DIAGNOSIS — R262 Difficulty in walking, not elsewhere classified: Secondary | ICD-10-CM | POA: Diagnosis not present

## 2018-08-27 DIAGNOSIS — E1140 Type 2 diabetes mellitus with diabetic neuropathy, unspecified: Secondary | ICD-10-CM | POA: Diagnosis not present

## 2018-08-27 NOTE — Progress Notes (Signed)
Location:   [facility] Room Number: [place] of Service:  SNF (31)   CODE STATUS: DNR  Allergies  Allergen Reactions  . Codeine Anaphylaxis and Hives  . Morphine And Related Anaphylaxis and Hives  . Penicillins Anaphylaxis  . Ace Inhibitors Cough    Chief Complaint  Patient presents with  . Medical Management of Chronic Issues    Type 2 diabetes uncontrolled with peripheral circulatory disorder; dyslipidemia associated with type 2 diabetes mellitus; CKD stage 2 due to type 2 diabetes mellitus     HPI:  She is a 78 year old long term resident of this facility being seen for the management of her chronic illnesses: diabetes; dyslipidemia; ckd stage 2. There are no reports of changes in appetite; no uncontrolled pain no reports of agitation present. There are no reports of fevers present.   Past Medical History:  Diagnosis Date  . Allergy   . Atrial fibrillation ([place]) 08/2011   First diagnosed in 08/2011; duration of arrhythmia is uncertain  . Bilateral lower extremity edema   . Chronic diarrhea    diverticulosis  . COPD (chronic obstructive pulmonary disease) ([place])   . Dementia ([place])   . Diabetes mellitus, type 2 (HCC)    Diabetic neuropathy  . Dyspnea on exertion    pedal edema  . Gout   . Headache(784.0)    twice weekly  . Hyperlipidemia   . Hypertension   . Osteopenia    DEXA scan 01/2010  . Palpitations   . Seasonal allergies   . Stress incontinence   . Vertigo     Past Surgical History:  Procedure Laterality Date  . APPENDECTOMY    . BREAST BIOPSY  2002   Right  . CESAREAN SECTION     x 2  . CHOLECYSTECTOMY    . COLONOSCOPY  2007   Negative screening study  . COLONOSCOPY WITH PROPOFOL N/A 09/18/2017   Procedure: COLONOSCOPY WITH PROPOFOL;  Surgeon: Daneil Dolin, MD;  [facility];  Service: Endoscopy;  Laterality: N/A;  11:00am  . HAMMER TOE SURGERY     Bilateral hammer toe amputation  . INCISIONAL HERNIA  REPAIR    . KNEE ARTHROSCOPY W/ MENISCAL REPAIR  2007   Bilateral  . POLYPECTOMY  09/18/2017   Procedure: POLYPECTOMY;  Surgeon: Daneil Dolin, MD;  [facility];  Service: Endoscopy;;  colon  . UMBILICAL HERNIA REPAIR      Social History   Socioeconomic History  . Marital status: Widowed    Spouse name: Not on file  . Number of children: 2  . Years of education: Not on file  . Highest education level: Not on file  Occupational History  . Occupation: Pension scheme manager  . Financial resource strain: Not hard at all  . Food insecurity:    Worry: Never true    Inability: Never true  . Transportation needs:    Medical: No    Non-medical: No  Tobacco Use  . Smoking status: Never Smoker  . Smokeless tobacco: Never Used  Substance and Sexual Activity  . Alcohol use: No  . Drug use: No  . Sexual activity: Not on file  Lifestyle  . Physical activity:    Days per week: 0 days    Minutes per session: 0 min  . Stress: Not at all  Relationships  . Social connections:    Talks on phone: Never    Gets together:  Never    Attends religious service: Never    Active member of club or organization: No    Attends meetings of clubs or organizations: Never    Relationship status: Widowed  . Intimate partner violence:    Fear of current or ex partner: No    Emotionally abused: No    Physically abused: No    Forced sexual activity: No  Other Topics Concern  . Not on file  Social History Narrative  . Not on file   Family History  Adopted: Yes      VITAL SIGNS BP (!) 119/51   Pulse 74   Temp 98.2 F (36.8 C)   Resp 20   Ht 5\' 6"  (1.676 m)   Wt 194 lb 3.2 oz (88.1 kg)   BMI 31.34 kg/m   Outpatient Encounter Medications as of 08/27/2018  Medication Sig  . acetaminophen (TYLENOL) 325 MG tablet Take 650 mg by mouth every 6 (six) hours as needed.   Marland Kitchen alendronate (FOSAMAX) 70 MG tablet Give 1 tablet by mouth on Sunday. Take with a full glass of water  on an empty stomach.  Marland Kitchen apixaban (ELIQUIS) 5 MG TABS tablet Take 5 mg by mouth 2 (two) times daily.   . carboxymethylcellulose (REFRESH PLUS) 0.5 % SOLN Place 1 drop into both eyes 4 (four) times daily.  . cholecalciferol (VITAMIN D) 1000 UNITS tablet Take 1,000 Units by mouth daily.  . citalopram (CELEXA) 20 MG tablet Take 1 tablet (20 mg total) by mouth daily.  . Cranberry-Vitamin C-Inulin (UTI-STAT) LIQD Take 30 mLs by mouth 2 (two) times a day.  Marland Kitchen Dextromethorphan-guaiFENesin (ROBITUSSIN DM PO) Take 15 mLs by mouth every 6 (six) hours as needed. May give up to 48 hours.  Inform physician immediately if cough or congestion is associated with fever or SOB  . digoxin (LANOXIN) 0.125 MG tablet Take 1 tablet by mouth once a day (HOLD FOR AP UNDER 60)  . furosemide (LASIX) 40 MG tablet Take 40 mg by mouth daily.   . insulin aspart (NOVOLOG FLEXPEN) 100 UNIT/ML FlexPen Inject 16 Units into the skin 3 (three) times daily with meals. Hold for CBG < 150  . Insulin Glargine (BASAGLAR KWIKPEN) 100 UNIT/ML SOPN Inject 48 Units into the skin at bedtime.   . memantine (NAMENDA) 10 MG tablet Take 10 mg by mouth 2 (two) times daily.  . metolazone (ZAROXOLYN) 2.5 MG tablet Take 5 mg by mouth daily. Mon. Fri   . metoprolol tartrate (LOPRESSOR) 25 MG tablet Take 75 mg by mouth 2 (two) times daily.  . Multiple Vitamin (MULTIVITAMIN WITH MINERALS) TABS Take 1 tablet by mouth every morning.  . NON FORMULARY Diet Type:  NAS, consistent CHO, Dysphagia 3 with thin liquids  Give Cranberry Juice two times daily at 9 am and 5 pm  . omeprazole (PRILOSEC) 40 MG capsule Take 40 mg by mouth daily.  . polyethylene glycol (MIRALAX / GLYCOLAX) packet Take 17 g by mouth daily as needed.   . potassium chloride (MICRO-K) 10 MEQ CR capsule Give 4 tablets (40 mg) by mouth two times daily  . pravastatin (PRAVACHOL) 20 MG tablet Take 20 mg by mouth daily. Take along with 40 mg to = 60 mg  . pravastatin (PRAVACHOL) 40 MG tablet Take  40 mg by mouth daily. Take along with 20 mg to = 60 mg  . sitaGLIPtin-metformin (JANUMET) 50-500 MG tablet Take 1 tablet by mouth 2 (two) times daily with a meal.  . [DISCONTINUED]  Insulin Pen Needle (EASY TOUCH PEN NEEDLES) 31G X 8 MM MISC Use twice a day   No facility-administered encounter medications on file as of 08/27/2018.      SIGNIFICANT DIAGNOSTIC EXAMS  LABS REVIEWED PREVIOUS:   11-08-17: tsh 3.269 12-18-17: chol 149; ldl 87; trig 157; hdl 31  01-12-18: dig 0.9 04-26-18: wbc 7.9; hgb 11.7; hct 38.0; mcv 90.9; plt 241; glucose 147; bun 24; creat 1.03 ;k+ 3.6; na++ 138; ca 9.3  05-23-18: hgb a1c 8.0   TODAY:   08-09-18: wbc 10.3 hgb 12.1; hct 39.2 mcv 92.2 ;plt 242; glucose 86 bun 31; creat 1.08; k+ 3.2; na++ 141; ca 9.3; chol 148; ldl 95; trig 107; hdl 32 hgb a1c 7.9 08-23-18: glucose 174; bun 30; creat 0.99; k+ 3.6; na++ 142; ca 9.1    Review of Systems  Unable to perform ROS: Dementia (unable to participate )    Physical Exam Constitutional:      General: She is not in acute distress.    Appearance: She is well-developed. She is not diaphoretic.  Neck:     Musculoskeletal: Neck supple.     Thyroid: No thyromegaly.  Cardiovascular:     Rate and Rhythm: Normal rate. Rhythm irregular.     Pulses: Normal pulses.     Heart sounds: Normal heart sounds.  Pulmonary:     Effort: Pulmonary effort is normal. No respiratory distress.     Breath sounds: Normal breath sounds.  Abdominal:     General: Bowel sounds are normal. There is no distension.     Palpations: Abdomen is soft.     Tenderness: There is no abdominal tenderness.  Musculoskeletal:     Right lower leg: Edema present.     Left lower leg: Edema present.     Comments: Trace bilateral lower extremity Is able to move all extremities    Lymphadenopathy:     Cervical: No cervical adenopathy.  Skin:    General: Skin is warm and dry.  Neurological:     Mental Status: She is alert. Mental status is at baseline.   Psychiatric:        Mood and Affect: Mood normal.     ASSESSMENT/ PLAN:  TODAY:   1. Type 2 diabetes uncontrolled with peripheral circulatory disorder:  hgb a1c 7.9 (8.0) is stable will continue basaglar 48 units nightly junamet 50/500 mg twice daily; and novolog 16 units with meals hold for cbg <150  2. Dyslipidemia associated with type 2 diabetes: is stable LDL 95 (87); will continue pravachol 60 mg daily   3. CKD stage 2 due to type 2 diabetes mellitus: is stable bun 30 creat 0.99    PREVIOUS   4.  Essential hypertension: is stable b/p 119/51:will continue lopressor 75 mg twice daily will monitor  5. Chronic atrial fibrillation: heart rate stable: will continue digoxin 0.125 mg daily; lopressor 75 mg twice daily for rate control; eliquis 5 mg twice daily   6. Bilateral leg edema: is stable will continue lasix 40 mg daily and zaroxolyn 5 mg on Monday and Fridays;   7. Hypokalemia: is stable k+ 3.6; will continue k+ 40 meq twice daily   8.  GERD without esophagitis: is stable will continue prilosec 40 mg daily   9. Unspecified dementia with behavorial disturbance: is without change weight is 194 (previous 195) pounds; will continue namenda 10 mg twice daily   10. Age related osteoporosis without current pathological fracture: is stable T score -4.4 (05/2016) will continue fosamax  70 mg weekly is on supplements   11. Major depression recurrent chronic: is emotionally stable will continue celexa 20 mg daily   MD is aware of resident's narcotic use and is in agreement with current plan of care. We will attempt to wean resident as apropriate   Ok Edwards NP [facility]  Contact [phone] Monday through Friday 8am- 5pm  After hours call [phone]

## 2018-08-28 DIAGNOSIS — I480 Paroxysmal atrial fibrillation: Secondary | ICD-10-CM | POA: Diagnosis not present

## 2018-08-28 DIAGNOSIS — R262 Difficulty in walking, not elsewhere classified: Secondary | ICD-10-CM | POA: Diagnosis not present

## 2018-08-28 DIAGNOSIS — J449 Chronic obstructive pulmonary disease, unspecified: Secondary | ICD-10-CM | POA: Diagnosis not present

## 2018-08-28 DIAGNOSIS — M6281 Muscle weakness (generalized): Secondary | ICD-10-CM | POA: Diagnosis not present

## 2018-08-28 DIAGNOSIS — E1140 Type 2 diabetes mellitus with diabetic neuropathy, unspecified: Secondary | ICD-10-CM | POA: Diagnosis not present

## 2018-08-29 ENCOUNTER — Other Ambulatory Visit: Payer: Self-pay | Admitting: Adult Health

## 2018-08-29 DIAGNOSIS — I480 Paroxysmal atrial fibrillation: Secondary | ICD-10-CM | POA: Diagnosis not present

## 2018-08-29 DIAGNOSIS — E1140 Type 2 diabetes mellitus with diabetic neuropathy, unspecified: Secondary | ICD-10-CM | POA: Diagnosis not present

## 2018-08-29 DIAGNOSIS — R262 Difficulty in walking, not elsewhere classified: Secondary | ICD-10-CM | POA: Diagnosis not present

## 2018-08-29 DIAGNOSIS — J449 Chronic obstructive pulmonary disease, unspecified: Secondary | ICD-10-CM | POA: Diagnosis not present

## 2018-08-29 DIAGNOSIS — M6281 Muscle weakness (generalized): Secondary | ICD-10-CM | POA: Diagnosis not present

## 2018-08-30 DIAGNOSIS — M6281 Muscle weakness (generalized): Secondary | ICD-10-CM | POA: Diagnosis not present

## 2018-08-30 DIAGNOSIS — E1140 Type 2 diabetes mellitus with diabetic neuropathy, unspecified: Secondary | ICD-10-CM | POA: Diagnosis not present

## 2018-08-30 DIAGNOSIS — R262 Difficulty in walking, not elsewhere classified: Secondary | ICD-10-CM | POA: Diagnosis not present

## 2018-08-30 DIAGNOSIS — J449 Chronic obstructive pulmonary disease, unspecified: Secondary | ICD-10-CM | POA: Diagnosis not present

## 2018-08-30 DIAGNOSIS — I480 Paroxysmal atrial fibrillation: Secondary | ICD-10-CM | POA: Diagnosis not present

## 2018-08-31 DIAGNOSIS — J449 Chronic obstructive pulmonary disease, unspecified: Secondary | ICD-10-CM | POA: Diagnosis not present

## 2018-08-31 DIAGNOSIS — R262 Difficulty in walking, not elsewhere classified: Secondary | ICD-10-CM | POA: Diagnosis not present

## 2018-08-31 DIAGNOSIS — M6281 Muscle weakness (generalized): Secondary | ICD-10-CM | POA: Diagnosis not present

## 2018-08-31 DIAGNOSIS — E1140 Type 2 diabetes mellitus with diabetic neuropathy, unspecified: Secondary | ICD-10-CM | POA: Diagnosis not present

## 2018-08-31 DIAGNOSIS — I480 Paroxysmal atrial fibrillation: Secondary | ICD-10-CM | POA: Diagnosis not present

## 2018-09-03 DIAGNOSIS — M6281 Muscle weakness (generalized): Secondary | ICD-10-CM | POA: Diagnosis not present

## 2018-09-03 DIAGNOSIS — R262 Difficulty in walking, not elsewhere classified: Secondary | ICD-10-CM | POA: Diagnosis not present

## 2018-09-03 DIAGNOSIS — I480 Paroxysmal atrial fibrillation: Secondary | ICD-10-CM | POA: Diagnosis not present

## 2018-09-03 DIAGNOSIS — J449 Chronic obstructive pulmonary disease, unspecified: Secondary | ICD-10-CM | POA: Diagnosis not present

## 2018-09-03 DIAGNOSIS — E1140 Type 2 diabetes mellitus with diabetic neuropathy, unspecified: Secondary | ICD-10-CM | POA: Diagnosis not present

## 2018-09-04 DIAGNOSIS — M6281 Muscle weakness (generalized): Secondary | ICD-10-CM | POA: Diagnosis not present

## 2018-09-04 DIAGNOSIS — J449 Chronic obstructive pulmonary disease, unspecified: Secondary | ICD-10-CM | POA: Diagnosis not present

## 2018-09-04 DIAGNOSIS — E1140 Type 2 diabetes mellitus with diabetic neuropathy, unspecified: Secondary | ICD-10-CM | POA: Diagnosis not present

## 2018-09-04 DIAGNOSIS — I480 Paroxysmal atrial fibrillation: Secondary | ICD-10-CM | POA: Diagnosis not present

## 2018-09-04 DIAGNOSIS — R262 Difficulty in walking, not elsewhere classified: Secondary | ICD-10-CM | POA: Diagnosis not present

## 2018-09-05 DIAGNOSIS — R262 Difficulty in walking, not elsewhere classified: Secondary | ICD-10-CM | POA: Diagnosis not present

## 2018-09-05 DIAGNOSIS — E1140 Type 2 diabetes mellitus with diabetic neuropathy, unspecified: Secondary | ICD-10-CM | POA: Diagnosis not present

## 2018-09-05 DIAGNOSIS — I480 Paroxysmal atrial fibrillation: Secondary | ICD-10-CM | POA: Diagnosis not present

## 2018-09-05 DIAGNOSIS — M6281 Muscle weakness (generalized): Secondary | ICD-10-CM | POA: Diagnosis not present

## 2018-09-05 DIAGNOSIS — J449 Chronic obstructive pulmonary disease, unspecified: Secondary | ICD-10-CM | POA: Diagnosis not present

## 2018-09-06 DIAGNOSIS — I480 Paroxysmal atrial fibrillation: Secondary | ICD-10-CM | POA: Diagnosis not present

## 2018-09-06 DIAGNOSIS — M6281 Muscle weakness (generalized): Secondary | ICD-10-CM | POA: Diagnosis not present

## 2018-09-06 DIAGNOSIS — J449 Chronic obstructive pulmonary disease, unspecified: Secondary | ICD-10-CM | POA: Diagnosis not present

## 2018-09-06 DIAGNOSIS — R262 Difficulty in walking, not elsewhere classified: Secondary | ICD-10-CM | POA: Diagnosis not present

## 2018-09-06 DIAGNOSIS — E1140 Type 2 diabetes mellitus with diabetic neuropathy, unspecified: Secondary | ICD-10-CM | POA: Diagnosis not present

## 2018-09-10 DIAGNOSIS — J449 Chronic obstructive pulmonary disease, unspecified: Secondary | ICD-10-CM | POA: Diagnosis not present

## 2018-09-10 DIAGNOSIS — M6281 Muscle weakness (generalized): Secondary | ICD-10-CM | POA: Diagnosis not present

## 2018-09-10 DIAGNOSIS — R262 Difficulty in walking, not elsewhere classified: Secondary | ICD-10-CM | POA: Diagnosis not present

## 2018-09-10 DIAGNOSIS — E1140 Type 2 diabetes mellitus with diabetic neuropathy, unspecified: Secondary | ICD-10-CM | POA: Diagnosis not present

## 2018-09-10 DIAGNOSIS — I480 Paroxysmal atrial fibrillation: Secondary | ICD-10-CM | POA: Diagnosis not present

## 2018-09-12 DIAGNOSIS — E1140 Type 2 diabetes mellitus with diabetic neuropathy, unspecified: Secondary | ICD-10-CM | POA: Diagnosis not present

## 2018-09-12 DIAGNOSIS — M6281 Muscle weakness (generalized): Secondary | ICD-10-CM | POA: Diagnosis not present

## 2018-09-12 DIAGNOSIS — R262 Difficulty in walking, not elsewhere classified: Secondary | ICD-10-CM | POA: Diagnosis not present

## 2018-09-12 DIAGNOSIS — J449 Chronic obstructive pulmonary disease, unspecified: Secondary | ICD-10-CM | POA: Diagnosis not present

## 2018-09-12 DIAGNOSIS — I480 Paroxysmal atrial fibrillation: Secondary | ICD-10-CM | POA: Diagnosis not present

## 2018-09-13 DIAGNOSIS — M6281 Muscle weakness (generalized): Secondary | ICD-10-CM | POA: Diagnosis not present

## 2018-09-13 DIAGNOSIS — E1140 Type 2 diabetes mellitus with diabetic neuropathy, unspecified: Secondary | ICD-10-CM | POA: Diagnosis not present

## 2018-09-13 DIAGNOSIS — I480 Paroxysmal atrial fibrillation: Secondary | ICD-10-CM | POA: Diagnosis not present

## 2018-09-13 DIAGNOSIS — R262 Difficulty in walking, not elsewhere classified: Secondary | ICD-10-CM | POA: Diagnosis not present

## 2018-09-13 DIAGNOSIS — J449 Chronic obstructive pulmonary disease, unspecified: Secondary | ICD-10-CM | POA: Diagnosis not present

## 2018-09-14 DIAGNOSIS — R262 Difficulty in walking, not elsewhere classified: Secondary | ICD-10-CM | POA: Diagnosis not present

## 2018-09-14 DIAGNOSIS — M6281 Muscle weakness (generalized): Secondary | ICD-10-CM | POA: Diagnosis not present

## 2018-09-14 DIAGNOSIS — I480 Paroxysmal atrial fibrillation: Secondary | ICD-10-CM | POA: Diagnosis not present

## 2018-09-14 DIAGNOSIS — J449 Chronic obstructive pulmonary disease, unspecified: Secondary | ICD-10-CM | POA: Diagnosis not present

## 2018-09-14 DIAGNOSIS — E1140 Type 2 diabetes mellitus with diabetic neuropathy, unspecified: Secondary | ICD-10-CM | POA: Diagnosis not present

## 2018-09-17 DIAGNOSIS — I480 Paroxysmal atrial fibrillation: Secondary | ICD-10-CM | POA: Diagnosis not present

## 2018-09-17 DIAGNOSIS — E1140 Type 2 diabetes mellitus with diabetic neuropathy, unspecified: Secondary | ICD-10-CM | POA: Diagnosis not present

## 2018-09-17 DIAGNOSIS — J449 Chronic obstructive pulmonary disease, unspecified: Secondary | ICD-10-CM | POA: Diagnosis not present

## 2018-09-17 DIAGNOSIS — R262 Difficulty in walking, not elsewhere classified: Secondary | ICD-10-CM | POA: Diagnosis not present

## 2018-09-17 DIAGNOSIS — M6281 Muscle weakness (generalized): Secondary | ICD-10-CM | POA: Diagnosis not present

## 2018-09-18 DIAGNOSIS — E1140 Type 2 diabetes mellitus with diabetic neuropathy, unspecified: Secondary | ICD-10-CM | POA: Diagnosis not present

## 2018-09-18 DIAGNOSIS — M6281 Muscle weakness (generalized): Secondary | ICD-10-CM | POA: Diagnosis not present

## 2018-09-18 DIAGNOSIS — I480 Paroxysmal atrial fibrillation: Secondary | ICD-10-CM | POA: Diagnosis not present

## 2018-09-18 DIAGNOSIS — J449 Chronic obstructive pulmonary disease, unspecified: Secondary | ICD-10-CM | POA: Diagnosis not present

## 2018-09-18 DIAGNOSIS — R262 Difficulty in walking, not elsewhere classified: Secondary | ICD-10-CM | POA: Diagnosis not present

## 2018-09-19 DIAGNOSIS — M6281 Muscle weakness (generalized): Secondary | ICD-10-CM | POA: Diagnosis not present

## 2018-09-19 DIAGNOSIS — R262 Difficulty in walking, not elsewhere classified: Secondary | ICD-10-CM | POA: Diagnosis not present

## 2018-09-19 DIAGNOSIS — J449 Chronic obstructive pulmonary disease, unspecified: Secondary | ICD-10-CM | POA: Diagnosis not present

## 2018-09-19 DIAGNOSIS — I480 Paroxysmal atrial fibrillation: Secondary | ICD-10-CM | POA: Diagnosis not present

## 2018-09-19 DIAGNOSIS — E1140 Type 2 diabetes mellitus with diabetic neuropathy, unspecified: Secondary | ICD-10-CM | POA: Diagnosis not present

## 2018-09-20 DIAGNOSIS — R262 Difficulty in walking, not elsewhere classified: Secondary | ICD-10-CM | POA: Diagnosis not present

## 2018-09-20 DIAGNOSIS — J449 Chronic obstructive pulmonary disease, unspecified: Secondary | ICD-10-CM | POA: Diagnosis not present

## 2018-09-20 DIAGNOSIS — M6281 Muscle weakness (generalized): Secondary | ICD-10-CM | POA: Diagnosis not present

## 2018-09-20 DIAGNOSIS — E1140 Type 2 diabetes mellitus with diabetic neuropathy, unspecified: Secondary | ICD-10-CM | POA: Diagnosis not present

## 2018-09-20 DIAGNOSIS — I480 Paroxysmal atrial fibrillation: Secondary | ICD-10-CM | POA: Diagnosis not present

## 2018-09-26 ENCOUNTER — Encounter: Payer: Self-pay | Admitting: Adult Health

## 2018-09-26 ENCOUNTER — Non-Acute Institutional Stay (SKILLED_NURSING_FACILITY): Payer: Medicare Other | Admitting: Adult Health

## 2018-09-26 DIAGNOSIS — I482 Chronic atrial fibrillation, unspecified: Secondary | ICD-10-CM | POA: Diagnosis not present

## 2018-09-26 DIAGNOSIS — N182 Chronic kidney disease, stage 2 (mild): Secondary | ICD-10-CM

## 2018-09-26 DIAGNOSIS — E1122 Type 2 diabetes mellitus with diabetic chronic kidney disease: Secondary | ICD-10-CM | POA: Diagnosis not present

## 2018-09-26 DIAGNOSIS — I129 Hypertensive chronic kidney disease with stage 1 through stage 4 chronic kidney disease, or unspecified chronic kidney disease: Secondary | ICD-10-CM | POA: Diagnosis not present

## 2018-09-26 DIAGNOSIS — R600 Localized edema: Secondary | ICD-10-CM

## 2018-09-26 NOTE — Progress Notes (Signed)
Location:   [place] Room Number: [place] of Service:  SNF (31)   CODE STATUS: DNR  Allergies  Allergen Reactions  . Codeine Anaphylaxis and Hives  . Morphine And Related Anaphylaxis and Hives  . Penicillins Anaphylaxis  . Ace Inhibitors Cough    Chief Complaint  Patient presents with  . Medical Management of Chronic Issues    Hypertension associated with stage 2 chronic kidney disease due to type 2 diabetes mellitus; chronic atrial fibrillation; bilateral leg edema.     HPI:  She is a 78 year old long term resident of this facility being seen for the management of her chronic illnesses: hypertension; afib; leg edema. There are no reports of uncontrolled pain; no lower extremity edema; no changes in her appetite. There are no reports of fevers present.   Past Medical History:  Diagnosis Date  . Allergy   . Atrial fibrillation ([place]) 08/2011   First diagnosed in 08/2011; duration of arrhythmia is uncertain  . Bilateral lower extremity edema   . Chronic diarrhea    diverticulosis  . COPD (chronic obstructive pulmonary disease) ([place])   . Dementia ([place])   . Diabetes mellitus, type 2 (HCC)    Diabetic neuropathy  . Dyspnea on exertion    pedal edema  . Gout   . Headache(784.0)    twice weekly  . Hyperlipidemia   . Hypertension   . Osteopenia    DEXA scan 01/2010  . Palpitations   . Seasonal allergies   . Stress incontinence   . Vertigo     Past Surgical History:  Procedure Laterality Date  . APPENDECTOMY    . BREAST BIOPSY  2002   Right  . CESAREAN SECTION     x 2  . CHOLECYSTECTOMY    . COLONOSCOPY  2007   Negative screening study  . COLONOSCOPY WITH PROPOFOL N/A 09/18/2017   Procedure: COLONOSCOPY WITH PROPOFOL;  Surgeon: Daneil Dolin, MD;  [facility];  Service: Endoscopy;  Laterality: N/A;  11:00am  . HAMMER TOE SURGERY     Bilateral hammer toe amputation  . INCISIONAL HERNIA REPAIR    . KNEE ARTHROSCOPY  W/ MENISCAL REPAIR  2007   Bilateral  . POLYPECTOMY  09/18/2017   Procedure: POLYPECTOMY;  Surgeon: Daneil Dolin, MD;  [facility];  Service: Endoscopy;;  colon  . UMBILICAL HERNIA REPAIR      Social History   Socioeconomic History  . Marital status: Widowed    Spouse name: Not on file  . Number of children: 2  . Years of education: Not on file  . Highest education level: Not on file  Occupational History  . Occupation: Pension scheme manager  . Financial resource strain: Not hard at all  . Food insecurity:    Worry: Never true    Inability: Never true  . Transportation needs:    Medical: No    Non-medical: No  Tobacco Use  . Smoking status: Never Smoker  . Smokeless tobacco: Never Used  Substance and Sexual Activity  . Alcohol use: No  . Drug use: No  . Sexual activity: Not on file  Lifestyle  . Physical activity:    Days per week: 0 days    Minutes per session: 0 min  . Stress: Not at all  Relationships  . Social connections:    Talks on phone: Never    Gets together: Never    Attends  religious service: Never    Active member of club or organization: No    Attends meetings of clubs or organizations: Never    Relationship status: Widowed  . Intimate partner violence:    Fear of current or ex partner: No    Emotionally abused: No    Physically abused: No    Forced sexual activity: No  Other Topics Concern  . Not on file  Social History Narrative  . Not on file   Family History  Adopted: Yes      VITAL SIGNS Ht 5\' 6"  (1.676 m)   Wt 192 lb 9.6 oz (87.4 kg)   BMI 31.09 kg/m   Outpatient Encounter Medications as of 09/26/2018  Medication Sig  . acetaminophen (TYLENOL) 325 MG tablet Take 650 mg by mouth every 6 (six) hours as needed.   Marland Kitchen alendronate (FOSAMAX) 70 MG tablet Give 1 tablet by mouth on Sunday. Take with a full glass of water on an empty stomach.  Marland Kitchen apixaban (ELIQUIS) 5 MG TABS tablet Take 5 mg by mouth 2 (two) times  daily.   . carboxymethylcellulose (REFRESH PLUS) 0.5 % SOLN Place 1 drop into both eyes 4 (four) times daily.  . cholecalciferol (VITAMIN D) 1000 UNITS tablet Take 1,000 Units by mouth daily.  . citalopram (CELEXA) 20 MG tablet Take 1 tablet (20 mg total) by mouth daily.  . Cranberry-Vitamin C-Inulin (UTI-STAT) LIQD Take 30 mLs by mouth 2 (two) times a day.  Marland Kitchen Dextromethorphan-guaiFENesin (ROBITUSSIN DM PO) Take 15 mLs by mouth every 6 (six) hours as needed. May give up to 48 hours.  Inform physician immediately if cough or congestion is associated with fever or SOB  . digoxin (LANOXIN) 0.125 MG tablet Take 1 tablet by mouth once a day (HOLD FOR AP UNDER 60)  . furosemide (LASIX) 40 MG tablet Take 40 mg by mouth daily.   . insulin aspart (NOVOLOG FLEXPEN) 100 UNIT/ML FlexPen Inject 16 Units into the skin 3 (three) times daily with meals. Hold for CBG < 150  . Insulin Glargine (BASAGLAR KWIKPEN) 100 UNIT/ML SOPN Inject 48 Units into the skin at bedtime.   . memantine (NAMENDA) 10 MG tablet Take 10 mg by mouth 2 (two) times daily.  . metolazone (ZAROXOLYN) 2.5 MG tablet Take 5 mg by mouth daily. Mon. Fri   . metoprolol tartrate (LOPRESSOR) 25 MG tablet Take 75 mg by mouth 2 (two) times daily.  . Multiple Vitamin (MULTIVITAMIN WITH MINERALS) TABS Take 1 tablet by mouth every morning.  . NON FORMULARY Diet Type:  NAS, consistent CHO, Dysphagia 3 with thin liquids  Give Cranberry Juice two times daily at 9 am and 5 pm  . omeprazole (PRILOSEC) 40 MG capsule Take 40 mg by mouth daily.  . polyethylene glycol (MIRALAX / GLYCOLAX) packet Take 17 g by mouth daily as needed.   . potassium chloride (MICRO-K) 10 MEQ CR capsule Give 4 tablets (40 mg) by mouth two times daily  . pravastatin (PRAVACHOL) 20 MG tablet Take 20 mg by mouth daily. Take along with 40 mg to = 60 mg  . pravastatin (PRAVACHOL) 40 MG tablet Take 40 mg by mouth daily. Take along with 20 mg to = 60 mg  . sitaGLIPtin-metformin (JANUMET)  50-500 MG tablet Take 1 tablet by mouth 2 (two) times daily with a meal.   No facility-administered encounter medications on file as of 09/26/2018.      SIGNIFICANT DIAGNOSTIC EXAMS  LABS REVIEWED PREVIOUS:   11-08-17: tsh 3.269  12-18-17: chol 149; ldl 87; trig 157; hdl 31  01-12-18: dig 0.9 04-26-18: wbc 7.9; hgb 11.7; hct 38.0; mcv 90.9; plt 241; glucose 147; bun 24; creat 1.03 ;k+ 3.6; na++ 138; ca 9.3  05-23-18: hgb a1c 8.0  08-09-18: wbc 10.3 hgb 12.1; hct 39.2 mcv 92.2 ;plt 242; glucose 86 bun 31; creat 1.08; k+ 3.2; na++ 141; ca 9.3; chol 148; ldl 95; trig 107; hdl 32 hgb a1c 7.9 08-23-18: glucose 174; bun 30; creat 0.99; k+ 3.6; na++ 142; ca 9.1   NO NEW LABS.   Review of Systems  Unable to perform ROS: Dementia (unable to participate )   Physical Exam Constitutional:      General: She is not in acute distress.    Appearance: She is well-developed and overweight. She is not diaphoretic.  Neck:     Musculoskeletal: Neck supple.     Thyroid: No thyromegaly.  Cardiovascular:     Rate and Rhythm: Normal rate. Rhythm irregular.     Pulses: Normal pulses.     Heart sounds: Normal heart sounds.  Pulmonary:     Effort: Pulmonary effort is normal. No respiratory distress.     Breath sounds: Normal breath sounds.  Abdominal:     General: Bowel sounds are normal. There is no distension.     Palpations: Abdomen is soft.     Tenderness: There is no abdominal tenderness.  Musculoskeletal:     Right lower leg: Edema present.     Left lower leg: Edema present.     Comments: Trace bilateral lower extremity Is able to move all extremities   Lymphadenopathy:     Cervical: No cervical adenopathy.  Skin:    General: Skin is warm and dry.  Neurological:     Mental Status: She is alert. Mental status is at baseline.  Psychiatric:        Mood and Affect: Mood normal.     ASSESSMENT/ PLAN:  TODAY:   1. Hypertension associated with stage 2 chronic kidney disease due to type 2  diabetes mellitus: is stable will continue lopressor 75 mg twice daily  2. Chronic atrial fibrillation heart rate stable will continue digoxin 0.125 mg daily lopressor 75 mg twice daily for rate control and eliquis 5 mg twice daily   3. Bilateral lower extremity edema; is stable will continue lasix 40 mg daily and zaroxolyn 5 mg twice weekly   PREVIOUS   4. Hypokalemia: is stable k+ 3.6; will continue k+ 40 meq twice daily   5.  GERD without esophagitis: is stable will continue prilosec 40 mg daily   6. Unspecified dementia with behavorial disturbance: is without change weight is 192 (previous 194, 195) pounds; will continue namenda 10 mg twice daily   7. Age related osteoporosis without current pathological fracture: is stable T score -4.4 (05/2016) will continue fosamax 70 mg weekly is on supplements   8. Major depression recurrent chronic: is emotionally stable will continue celexa 20 mg daily   9. 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 Dyslipidemia associated with type 2 diabetes: is stable LDL 95 (87); will continue pravachol 60 mg daily   11. CKD stage 2 due to type 2 diabetes mellitus: is stable bun 30 creat 0.99     MD is aware of resident's narcotic use and is in  agreement with current plan of care. We will attempt to wean resident as apropriate   Ok Edwards NP [facility]  Contact [phone] Monday through Friday 8am- 5pm  After hours call [phone]

## 2018-09-27 DIAGNOSIS — E1122 Type 2 diabetes mellitus with diabetic chronic kidney disease: Secondary | ICD-10-CM | POA: Insufficient documentation

## 2018-09-27 DIAGNOSIS — N182 Chronic kidney disease, stage 2 (mild): Secondary | ICD-10-CM | POA: Insufficient documentation

## 2018-10-08 ENCOUNTER — Encounter: Payer: Self-pay | Admitting: Internal Medicine

## 2018-10-08 ENCOUNTER — Non-Acute Institutional Stay (SKILLED_NURSING_FACILITY): Payer: Medicare Other | Admitting: Internal Medicine

## 2018-10-08 DIAGNOSIS — E1151 Type 2 diabetes mellitus with diabetic peripheral angiopathy without gangrene: Secondary | ICD-10-CM | POA: Diagnosis not present

## 2018-10-08 DIAGNOSIS — I482 Chronic atrial fibrillation, unspecified: Secondary | ICD-10-CM

## 2018-10-08 DIAGNOSIS — IMO0002 Reserved for concepts with insufficient information to code with codable children: Secondary | ICD-10-CM

## 2018-10-08 DIAGNOSIS — F0391 Unspecified dementia with behavioral disturbance: Secondary | ICD-10-CM | POA: Diagnosis not present

## 2018-10-08 DIAGNOSIS — E1165 Type 2 diabetes mellitus with hyperglycemia: Secondary | ICD-10-CM

## 2018-10-08 DIAGNOSIS — I10 Essential (primary) hypertension: Secondary | ICD-10-CM | POA: Diagnosis not present

## 2018-10-08 NOTE — Progress Notes (Deleted)
Location:  [place] Room Number: [place] of Service:  SNF [phone]) Provider:  Veleta Miners, MD  Celedonio Savage, MD  Patient Care Team: Celedonio Savage, MD as PCP - General (Family Medicine) Rothbart, Cristopher Estimable, MD (Cardiology) Lendon Colonel, NP as Nurse Practitioner (Nurse Practitioner)  Extended Emergency Contact Information Primary Emergency Contact: [facility] Address: [address]          [place], [place] [address] [name] of [place] Phone: [phone] Mobile Phone: [phone] Relation: Daughter  Code Status:  DNR Goals of care: Advanced Directive information Advanced Directives 10/08/2018  Does Patient Have a Medical Advance Directive? Yes  Type of Advance Directive Out of facility DNR (pink MOST or yellow form)  Does patient want to make changes to medical advance directive? No - Patient declined  Copy of [place] in Chart? -  Pre-existing out of facility DNR order (yellow form or pink MOST form) Yellow form placed in chart (order not valid for inpatient use)     Chief Complaint  Patient presents with  . Medical Management of Chronic Issues    Diabetes Mellitus Type 2, Hypertension, Hyperlipidemia  . Best Practice Recommendations    Due for Urine Microalbumin    HPI:  Pt is a 78 y.o. female seen today for medical management of chronic diseases.     Past Medical History:  Diagnosis Date  . Allergy   . Atrial fibrillation ([place]) 08/2011   First diagnosed in 08/2011; duration of arrhythmia is uncertain  . Bilateral lower extremity edema   . Chronic diarrhea    diverticulosis  . COPD (chronic obstructive pulmonary disease) ([place])   . Dementia ([place])   . Diabetes mellitus, type 2 (HCC)    Diabetic neuropathy  . Dyspnea on exertion    pedal edema  . Gout   . Headache(784.0)    twice weekly  . Hyperlipidemia   . Hypertension   . Osteopenia    DEXA scan 01/2010  . Palpitations   . Seasonal allergies    . Stress incontinence   . Vertigo    Past Surgical History:  Procedure Laterality Date  . APPENDECTOMY    . BREAST BIOPSY  2002   Right  . CESAREAN SECTION     x 2  . CHOLECYSTECTOMY    . COLONOSCOPY  2007   Negative screening study  . COLONOSCOPY WITH PROPOFOL N/A 09/18/2017   Procedure: COLONOSCOPY WITH PROPOFOL;  Surgeon: Daneil Dolin, MD;  [facility];  Service: Endoscopy;  Laterality: N/A;  11:00am  . HAMMER TOE SURGERY     Bilateral hammer toe amputation  . INCISIONAL HERNIA REPAIR    . KNEE ARTHROSCOPY W/ MENISCAL REPAIR  2007   Bilateral  . POLYPECTOMY  09/18/2017   Procedure: POLYPECTOMY;  Surgeon: Daneil Dolin, MD;  [facility];  Service: Endoscopy;;  colon  . UMBILICAL HERNIA REPAIR      Allergies  Allergen Reactions  . Codeine Anaphylaxis and Hives  . Morphine And Related Anaphylaxis and Hives  . Penicillins Anaphylaxis  . Ace Inhibitors Cough    Outpatient Encounter Medications as of 10/08/2018  Medication Sig  . acetaminophen (TYLENOL) 325 MG tablet Take 650 mg by mouth every 6 (six) hours as needed.   Marland Kitchen alendronate (FOSAMAX) 70 MG tablet Give 1 tablet by mouth on Sunday. Take with a full glass of water on an empty stomach.  Marland Kitchen apixaban (ELIQUIS) 5 MG TABS  tablet Take 5 mg by mouth 2 (two) times daily.   . carboxymethylcellulose (REFRESH PLUS) 0.5 % SOLN Place 1 drop into both eyes 4 (four) times daily.  . cholecalciferol (VITAMIN D) 1000 UNITS tablet Take 1,000 Units by mouth daily.  . citalopram (CELEXA) 20 MG tablet Take 1 tablet (20 mg total) by mouth daily.  . Cranberry-Vitamin C-Inulin (UTI-STAT) LIQD Take 30 mLs by mouth 2 (two) times a day.  Marland Kitchen Dextromethorphan-guaiFENesin (ROBITUSSIN DM PO) Take 15 mLs by mouth every 6 (six) hours as needed. May give up to 48 hours.  Inform physician immediately if cough or congestion is associated with fever or SOB  . digoxin (LANOXIN) 0.125 MG tablet Take 1 tablet by mouth once a day (HOLD  FOR AP UNDER 60)  . furosemide (LASIX) 40 MG tablet Take 40 mg by mouth daily.   . insulin aspart (NOVOLOG FLEXPEN) 100 UNIT/ML FlexPen Inject 16 Units into the skin 3 (three) times daily with meals. Hold for CBG < 150  . Insulin Glargine (BASAGLAR KWIKPEN) 100 UNIT/ML SOPN Inject 48 Units into the skin at bedtime.   . memantine (NAMENDA) 10 MG tablet Take 10 mg by mouth 2 (two) times daily.  . metolazone (ZAROXOLYN) 2.5 MG tablet Take 5 mg by mouth daily. Mon. Fri   . metoprolol tartrate (LOPRESSOR) 25 MG tablet Take 75 mg by mouth 2 (two) times daily.  . Multiple Vitamin (MULTIVITAMIN WITH MINERALS) TABS Take 1 tablet by mouth every morning.  . NON FORMULARY Diet Type:  NAS, consistent CHO, Dysphagia 3 with thin liquids  Give Cranberry Juice two times daily at 9 am and 5 pm  . omeprazole (PRILOSEC) 40 MG capsule Take 40 mg by mouth daily.  . polyethylene glycol (MIRALAX / GLYCOLAX) packet Take 17 g by mouth daily as needed.   . potassium chloride (MICRO-K) 10 MEQ CR capsule Give 4 tablets (40 mg) by mouth two times daily  . pravastatin (PRAVACHOL) 20 MG tablet Take 20 mg by mouth daily. Take along with 40 mg to = 60 mg  . pravastatin (PRAVACHOL) 40 MG tablet Take 40 mg by mouth daily. Take along with 20 mg to = 60 mg  . sitaGLIPtin-metformin (JANUMET) 50-500 MG tablet Take 1 tablet by mouth 2 (two) times daily with a meal.   No facility-administered encounter medications on file as of 10/08/2018.     Review of Systems  Immunization History  Administered Date(s) Administered  . Influenza-Unspecified 02/04/2016, 02/03/2017, 02/01/2018  . Pneumococcal Conjugate-13 02/03/2017  . Pneumococcal-Unspecified 02/08/2016  . Tdap 01/19/2017  . Zoster 08/02/2018   Pertinent  Health Maintenance Due  Topic Date Due  . URINE MICROALBUMIN  11/07/2018 (Originally 08/23/2018)  . FOOT EXAM  10/31/2018  . INFLUENZA VACCINE  12/01/2018  . HEMOGLOBIN A1C  02/08/2019  . OPHTHALMOLOGY EXAM  03/07/2019  .  DEXA SCAN  Completed  . PNA vac Low Risk Adult  Completed   Fall Risk  01/30/2018 01/19/2017 11/06/2015 09/29/2015 08/31/2015  Falls in the past year? No No Yes No No  Number falls in past yr: - - 2 or more - -  Injury with Fall? - - No - -   Functional Status Survey:    Vitals:   10/08/18 0848  BP: 107/65  Pulse: 65  Resp: (!) 22  Temp: 98.2 F (36.8 C)  Weight: 191 lb 3.2 oz (86.7 kg)  Height: 5\' 6"  (1.676 m)   Body mass index is 30.86 kg/m. Physical Exam  Labs  reviewed: Recent Labs    04/26/18 0740 08/09/18 0318 08/23/18 0500  NA 138 141 142  K 3.6 3.2* 3.6  CL 101 99 105  CO2 28 32 30  GLUCOSE 147* 86 174*  BUN 24* 31* 30*  CREATININE 1.03* 1.08* 0.99  CALCIUM 9.3 9.3 9.1   Recent Labs    11/08/17 1625  AST 25  ALT 19  ALKPHOS 52  BILITOT 0.5  PROT 7.9  ALBUMIN 3.3*   Recent Labs    11/27/17 0700 12/18/17 0700 04/26/18 0740 08/09/18 0318  WBC 9.7 9.1 7.9 10.3  NEUTROABS 5.3  --  4.8 6.4  HGB 12.3 11.8* 11.7* 12.1  HCT 39.6 38.1 38.0 39.2  MCV 89.0 89.6 90.9 92.2  PLT 233 217 241 242   Lab Results  Component Value Date   TSH 3.269 11/08/2017   Lab Results  Component Value Date   HGBA1C 7.9 (H) 08/09/2018   Lab Results  Component Value Date   CHOL 148 08/09/2018   HDL 32 (L) 08/09/2018   LDLCALC 95 08/09/2018   TRIG 107 08/09/2018   CHOLHDL 4.6 08/09/2018    Significant Diagnostic Results in last 30 days:  No results found.  Assessment/Plan  Essential hypertension BP stable on Lopressor,Lasix and Metolazone Chronic A. 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 This morning it was 64 Will decrease her Lantus to 44 units Decrease her Novolog bolus with Lunch 10 units  Discontinue rest of the bolus Diastolic CHF with chronic lower extremity  edema On Lasix and Metolazone On Potassium Repeat BMP Age-related osteoporosis Stable on Fosamax Hyperlipidemia LDL95on statin Depression with dementia On Celexa and Namenda Seems Depressed Will consider Remeron if does not improve Nurses think it is due to her progression of Dementia Has H/OGI bleed Patient is s/p Colonoscopy and Polypectomy. Hgb stable Health Maintenance Will give her Zoster Vaccination  Family/ staff Communication:   Labs/tests ordered:  BMP,CBC And Dig Level, TSH

## 2018-10-08 NOTE — Progress Notes (Signed)
Location:  [place] Room Number: [place] of Service:  SNF [phone]) Provider:  Veleta Miners, MD  Celedonio Savage, MD  Patient Care Team: Celedonio Savage, MD as PCP - General (Family Medicine) Rothbart, Cristopher Estimable, MD (Cardiology) Lendon Colonel, NP as Nurse Practitioner (Nurse Practitioner)  Extended Emergency Contact Information Primary Emergency Contact: [facility] Address: [address]          [place], [place] [address] [name] of [place] Phone: [phone] Mobile Phone: [phone] Relation: Daughter  Code Status:  DNR Goals of care: Advanced Directive information Advanced Directives 10/08/2018  Does Patient Have a Medical Advance Directive? Yes  Type of Advance Directive Out of facility DNR (pink MOST or yellow form)  Does patient want to make changes to medical advance directive? No - Patient declined  Copy of [place] in Chart? -  Pre-existing out of facility DNR order (yellow form or pink MOST form) Yellow form placed in chart (order not valid for inpatient use)     Chief Complaint  Patient presents with  . Acute Visit    Diabetes Mellitus    HPI:  Pt is a 78 y.o. female seen today for an acute visit for Hyperglycemia and Worsening Confusion  Patient has h/o Diabetes mellitus type 2 , Atrial fibrillation on chronic anticoagulation, Venous stasis, Dementia with Behavior problems, Recurrent Cellulitis oFRightLE.and r GI bleed s/p Colonoscopy with Polypectomy and Recurrent Falls  Per Nurses patient seems to have been more confused in past few weeks Not eating well. Has lost few pounds. Patient also Has been running BS less then 100 and this Morning it was 64. She did not have Fever or Cough. But she said that she was feeling weak and tired all the time. Denies any abdominal Pain or Nausea  Past Medical History:  Diagnosis Date  . Allergy   . Atrial fibrillation ([place]) 08/2011   First diagnosed in 08/2011;  duration of arrhythmia is uncertain  . Bilateral lower extremity edema   . Chronic diarrhea    diverticulosis  . COPD (chronic obstructive pulmonary disease) ([place])   . Dementia ([place])   . Diabetes mellitus, type 2 (HCC)    Diabetic neuropathy  . Dyspnea on exertion    pedal edema  . Gout   . Headache(784.0)    twice weekly  . Hyperlipidemia   . Hypertension   . Osteopenia    DEXA scan 01/2010  . Palpitations   . Seasonal allergies   . Stress incontinence   . Vertigo    Past Surgical History:  Procedure Laterality Date  . APPENDECTOMY    . BREAST BIOPSY  2002   Right  . CESAREAN SECTION     x 2  . CHOLECYSTECTOMY    . COLONOSCOPY  2007   Negative screening study  . COLONOSCOPY WITH PROPOFOL N/A 09/18/2017   Procedure: COLONOSCOPY WITH PROPOFOL;  Surgeon: Daneil Dolin, MD;  [facility];  Service: Endoscopy;  Laterality: N/A;  11:00am  . HAMMER TOE SURGERY     Bilateral hammer toe amputation  . INCISIONAL HERNIA REPAIR    . KNEE ARTHROSCOPY W/ MENISCAL REPAIR  2007   Bilateral  . POLYPECTOMY  09/18/2017   Procedure: POLYPECTOMY;  Surgeon: Daneil Dolin, MD;  [facility];  Service: Endoscopy;;  colon  . UMBILICAL HERNIA REPAIR      Allergies  Allergen Reactions  . Codeine Anaphylaxis and Hives  . Morphine And  Related Anaphylaxis and Hives  . Penicillins Anaphylaxis  . Ace Inhibitors Cough    Outpatient Encounter Medications as of 10/08/2018  Medication Sig  . acetaminophen (TYLENOL) 325 MG tablet Take 650 mg by mouth every 6 (six) hours as needed.   Marland Kitchen alendronate (FOSAMAX) 70 MG tablet Give 1 tablet by mouth on Sunday. Take with a full glass of water on an empty stomach.  Marland Kitchen apixaban (ELIQUIS) 5 MG TABS tablet Take 5 mg by mouth 2 (two) times daily.   . carboxymethylcellulose (REFRESH PLUS) 0.5 % SOLN Place 1 drop into both eyes 4 (four) times daily.  . cholecalciferol (VITAMIN D) 1000 UNITS tablet Take 1,000 Units by mouth daily.  .  citalopram (CELEXA) 20 MG tablet Take 1 tablet (20 mg total) by mouth daily.  . Cranberry-Vitamin C-Inulin (UTI-STAT) LIQD Take 30 mLs by mouth 2 (two) times a day.  Marland Kitchen Dextromethorphan-guaiFENesin (ROBITUSSIN DM PO) Take 15 mLs by mouth every 6 (six) hours as needed. May give up to 48 hours.  Inform physician immediately if cough or congestion is associated with fever or SOB  . digoxin (LANOXIN) 0.125 MG tablet Take 1 tablet by mouth once a day (HOLD FOR AP UNDER 60)  . furosemide (LASIX) 40 MG tablet Take 40 mg by mouth daily.   . insulin aspart (NOVOLOG) 100 UNIT/ML injection Inject 10 Units into the skin once. Only With Lunch if BS are more then 150  . insulin glargine (LANTUS) 100 UNIT/ML injection Inject 44 Units into the skin at bedtime.  . memantine (NAMENDA) 10 MG tablet Take 10 mg by mouth 2 (two) times daily.  . metolazone (ZAROXOLYN) 2.5 MG tablet Take 5 mg by mouth daily. Mon. Fri   . metoprolol tartrate (LOPRESSOR) 25 MG tablet Take 75 mg by mouth 2 (two) times daily.  . Multiple Vitamin (MULTIVITAMIN WITH MINERALS) TABS Take 1 tablet by mouth every morning.  . NON FORMULARY Diet Type:  NAS, consistent CHO, Dysphagia 3 with thin liquids  Give Cranberry Juice two times daily at 9 am and 5 pm  . omeprazole (PRILOSEC) 40 MG capsule Take 40 mg by mouth daily.  . polyethylene glycol (MIRALAX / GLYCOLAX) packet Take 17 g by mouth daily as needed.   . potassium chloride (MICRO-K) 10 MEQ CR capsule Give 4 tablets (40 mg) by mouth two times daily  . pravastatin (PRAVACHOL) 20 MG tablet Take 20 mg by mouth daily. Take along with 40 mg to = 60 mg  . pravastatin (PRAVACHOL) 40 MG tablet Take 40 mg by mouth daily. Take along with 20 mg to = 60 mg  . sitaGLIPtin-metformin (JANUMET) 50-500 MG tablet Take 1 tablet by mouth 2 (two) times daily with a meal.   No facility-administered encounter medications on file as of 10/08/2018.     Review of Systems  Unable to perform ROS: Dementia     Immunization History  Administered Date(s) Administered  . Influenza-Unspecified 02/04/2016, 02/03/2017, 02/01/2018  . Pneumococcal Conjugate-13 02/03/2017  . Pneumococcal-Unspecified 02/08/2016  . Tdap 01/19/2017  . Zoster 08/02/2018   Pertinent  Health Maintenance Due  Topic Date Due  . URINE MICROALBUMIN  11/07/2018 (Originally 08/23/2018)  . FOOT EXAM  10/31/2018  . INFLUENZA VACCINE  12/01/2018  . HEMOGLOBIN A1C  02/08/2019  . OPHTHALMOLOGY EXAM  03/07/2019  . DEXA SCAN  Completed  . PNA vac Low Risk Adult  Completed   Fall Risk  01/30/2018 01/19/2017 11/06/2015 09/29/2015 08/31/2015  Falls in the past year? No No  Yes No No  Number falls in past yr: - - 2 or more - -  Injury with Fall? - - No - -   Functional Status Survey:    Vitals:   10/08/18 1424  BP: 107/65  Pulse: 82  Resp: (!) 22  Temp: 98.4 F (36.9 C)  Weight: 191 lb 3.2 oz (86.7 kg)  Height: 5\' 6"  (1.676 m)   Body mass index is 30.86 kg/m. Physical Exam Vitals signs reviewed.  Constitutional:      Appearance: Normal appearance.  HENT:     Head: Normocephalic.     Nose: Nose normal.     Mouth/Throat:     Mouth: Mucous membranes are moist.     Pharynx: Oropharynx is clear.  Neck:     Musculoskeletal: Neck supple.  Cardiovascular:     Rate and Rhythm: Normal rate. Rhythm irregular.     Pulses: Normal pulses.     Heart sounds: Normal heart sounds.  Pulmonary:     Effort: Pulmonary effort is normal. No respiratory distress.     Breath sounds: Normal breath sounds. No wheezing.  Abdominal:     General: Abdomen is flat. Bowel sounds are normal.     Palpations: Abdomen is soft.  Musculoskeletal:     Comments: Continues to have Chronic Swelling Bilateral  Right more then Left Right has Chronic Redness  Skin:    General: Skin is warm and dry.  Neurological:     General: No focal deficit present.     Mental Status: She is alert.     Comments: No Neuro Deficits. Not oriented. Walks with Assist but  mostly wheelchair dependent  Psychiatric:        Mood and Affect: Mood normal.        Thought Content: Thought content normal.        Judgment: Judgment normal.     Labs reviewed: Recent Labs    04/26/18 0740 08/09/18 0318 08/23/18 0500  NA 138 141 142  K 3.6 3.2* 3.6  CL 101 99 105  CO2 28 32 30  GLUCOSE 147* 86 174*  BUN 24* 31* 30*  CREATININE 1.03* 1.08* 0.99  CALCIUM 9.3 9.3 9.1   Recent Labs    11/08/17 1625  AST 25  ALT 19  ALKPHOS 52  BILITOT 0.5  PROT 7.9  ALBUMIN 3.3*   Recent Labs    11/27/17 0700 12/18/17 0700 04/26/18 0740 08/09/18 0318  WBC 9.7 9.1 7.9 10.3  NEUTROABS 5.3  --  4.8 6.4  HGB 12.3 11.8* 11.7* 12.1  HCT 39.6 38.1 38.0 39.2  MCV 89.0 89.6 90.9 92.2  PLT 233 217 241 242   Lab Results  Component Value Date   TSH 3.269 11/08/2017   Lab Results  Component Value Date   HGBA1C 7.9 (H) 08/09/2018   Lab Results  Component Value Date   CHOL 148 08/09/2018   HDL 32 (L) 08/09/2018   LDLCALC 95 08/09/2018   TRIG 107 08/09/2018   CHOLHDL 4.6 08/09/2018    Significant Diagnostic Results in last 30 days:  No results found.  Assessment/Plan  Type 2 diabetes with diabetes neuropathy A1cis 7.9 in 04/20  Her BS were running low in the mornings and Late in evening. This morning it was 64 Will decrease her Lantus to 44 units Decrease her Novolog bolus with Lunch 10 units  Discontinue rest of the bolus Depression with dementia On Celexa and Namenda Seems Depressed Will consider Remeron if does not improve  Nurses think it is due to her progression of Dementia Will Do lab Work   Essential hypertension BP stable on Lopressor,Lasix and Metolazone Chronic A. fib Rate control on Lopressor and digoxin Dig level was normal in09/19 On Eliquis Will recheck Dig level  Diastolic CHF with chronic lower extremity edema On Lasix and Metolazone On Potassium Repeat BMP Age-related osteoporosis Stable on Fosamax Hyperlipidemia  LDL95on statin  Has H/OGI bleed Patient is s/p Colonoscopy and Polypectomy. Hgb stable   Family/ staff Communication:   Labs/tests ordered:  BMP,CBC And Dig Level, TSH

## 2018-10-08 NOTE — Progress Notes (Signed)
Entered in error

## 2018-10-09 ENCOUNTER — Encounter (HOSPITAL_COMMUNITY)
Admission: RE | Admit: 2018-10-09 | Discharge: 2018-10-09 | Disposition: A | Discharge status: Home / Self Care | Payer: Medicare Other | Source: Skilled Nursing Facility | Attending: Internal Medicine | Admitting: Internal Medicine

## 2018-10-09 DIAGNOSIS — M6281 Muscle weakness (generalized): Secondary | ICD-10-CM | POA: Diagnosis not present

## 2018-10-09 DIAGNOSIS — I5042 Chronic combined systolic (congestive) and diastolic (congestive) heart failure: Secondary | ICD-10-CM | POA: Diagnosis not present

## 2018-10-09 DIAGNOSIS — R262 Difficulty in walking, not elsewhere classified: Secondary | ICD-10-CM | POA: Diagnosis not present

## 2018-10-09 DIAGNOSIS — R1310 Dysphagia, unspecified: Secondary | ICD-10-CM | POA: Diagnosis not present

## 2018-10-09 DIAGNOSIS — R41841 Cognitive communication deficit: Secondary | ICD-10-CM | POA: Diagnosis not present

## 2018-10-09 DIAGNOSIS — I110 Hypertensive heart disease with heart failure: Secondary | ICD-10-CM | POA: Insufficient documentation

## 2018-10-09 DIAGNOSIS — R0609 Other forms of dyspnea: Secondary | ICD-10-CM | POA: Diagnosis not present

## 2018-10-09 DIAGNOSIS — M810 Age-related osteoporosis without current pathological fracture: Secondary | ICD-10-CM | POA: Diagnosis not present

## 2018-10-09 DIAGNOSIS — E1140 Type 2 diabetes mellitus with diabetic neuropathy, unspecified: Secondary | ICD-10-CM | POA: Insufficient documentation

## 2018-10-09 DIAGNOSIS — Z7901 Long term (current) use of anticoagulants: Secondary | ICD-10-CM | POA: Diagnosis not present

## 2018-10-09 DIAGNOSIS — I480 Paroxysmal atrial fibrillation: Secondary | ICD-10-CM | POA: Diagnosis not present

## 2018-10-09 DIAGNOSIS — F329 Major depressive disorder, single episode, unspecified: Secondary | ICD-10-CM | POA: Diagnosis not present

## 2018-10-09 DIAGNOSIS — J301 Allergic rhinitis due to pollen: Secondary | ICD-10-CM | POA: Diagnosis not present

## 2018-10-09 LAB — CBC WITH DIFFERENTIAL/PLATELET
Abs Immature Granulocytes: 0.03 10*3/uL (ref 0.00–0.07)
Basophils Absolute: 0.1 10*3/uL (ref 0.0–0.1)
Basophils Relative: 1 %
Eosinophils Absolute: 0.3 10*3/uL (ref 0.0–0.5)
Eosinophils Relative: 3 %
HCT: 38.2 % (ref 36.0–46.0)
Hemoglobin: 11.9 g/dL — ABNORMAL LOW (ref 12.0–15.0)
Immature Granulocytes: 0 %
Lymphocytes Relative: 22 %
Lymphs Abs: 1.9 10*3/uL (ref 0.7–4.0)
MCH: 28.5 pg (ref 26.0–34.0)
MCHC: 31.2 g/dL (ref 30.0–36.0)
MCV: 91.6 fL (ref 80.0–100.0)
Monocytes Absolute: 0.8 10*3/uL (ref 0.1–1.0)
Monocytes Relative: 10 %
Neutro Abs: 5.3 10*3/uL (ref 1.7–7.7)
Neutrophils Relative %: 64 %
Platelets: 232 10*3/uL (ref 150–400)
RBC: 4.17 MIL/uL (ref 3.87–5.11)
RDW: 14.1 % (ref 11.5–15.5)
WBC: 8.3 10*3/uL (ref 4.0–10.5)
nRBC: 0 % (ref 0.0–0.2)

## 2018-10-09 LAB — BASIC METABOLIC PANEL
Anion gap: 9 (ref 5–15)
BUN: 29 mg/dL — ABNORMAL HIGH (ref 8–23)
CO2: 29 mmol/L (ref 22–32)
Calcium: 9.2 mg/dL (ref 8.9–10.3)
Chloride: 99 mmol/L (ref 98–111)
Creatinine, Ser: 1.06 mg/dL — ABNORMAL HIGH (ref 0.44–1.00)
GFR calc Af Amer: 58 mL/min — ABNORMAL LOW (ref >60)
GFR calc non Af Amer: 50 mL/min — ABNORMAL LOW (ref >60)
Glucose, Bld: 203 mg/dL — ABNORMAL HIGH (ref 70–99)
Potassium: 3.5 mmol/L (ref 3.5–5.1)
Sodium: 137 mmol/L (ref 135–145)

## 2018-10-09 LAB — TSH: TSH: 2.608 u[IU]/mL (ref 0.350–4.500)

## 2018-10-09 LAB — DIGOXIN LEVEL: Digoxin Level: 1 ng/mL (ref 0.8–2.0)

## 2018-10-11 ENCOUNTER — Encounter: Payer: Self-pay | Admitting: Adult Health

## 2018-10-11 ENCOUNTER — Non-Acute Institutional Stay (SKILLED_NURSING_FACILITY): Payer: Medicare Other | Admitting: Adult Health

## 2018-10-11 DIAGNOSIS — F339 Major depressive disorder, recurrent, unspecified: Secondary | ICD-10-CM

## 2018-10-11 DIAGNOSIS — F0391 Unspecified dementia with behavioral disturbance: Secondary | ICD-10-CM

## 2018-10-11 NOTE — Progress Notes (Signed)
Location:   [place] Room Number: [place] of Service:  SNF (31)   CODE STATUS: DNR  Allergies  Allergen Reactions  . Codeine Anaphylaxis and Hives  . Morphine And Related Anaphylaxis and Hives  . Penicillins Anaphylaxis  . Ace Inhibitors Cough    Chief Complaint  Patient presents with  . Acute Visit    Psychotropic Review    HPI:  She is on long term celexa 20 mg daily for her depression. She has been on this dose long term. There are no reports of depression or anxiety. The care plan tem feel as though it is time to lower her celexa. Dr. Lyndel Safe does not feel that it is appropriate at this time to lower her celexa dose. There are no reports of insomnia or changes in appetite.   Past Medical History:  Diagnosis Date  . Allergy   . Atrial fibrillation ([place]) 08/2011   First diagnosed in 08/2011; duration of arrhythmia is uncertain  . Bilateral lower extremity edema   . Chronic diarrhea    diverticulosis  . COPD (chronic obstructive pulmonary disease) ([place])   . Dementia ([place])   . Diabetes mellitus, type 2 (HCC)    Diabetic neuropathy  . Dyspnea on exertion    pedal edema  . Gout   . Headache(784.0)    twice weekly  . Hyperlipidemia   . Hypertension   . Osteopenia    DEXA scan 01/2010  . Palpitations   . Seasonal allergies   . Stress incontinence   . Vertigo     Past Surgical History:  Procedure Laterality Date  . APPENDECTOMY    . BREAST BIOPSY  2002   Right  . CESAREAN SECTION     x 2  . CHOLECYSTECTOMY    . COLONOSCOPY  2007   Negative screening study  . COLONOSCOPY WITH PROPOFOL N/A 09/18/2017   Procedure: COLONOSCOPY WITH PROPOFOL;  Surgeon: Daneil Dolin, MD;  [facility];  Service: Endoscopy;  Laterality: N/A;  11:00am  . HAMMER TOE SURGERY     Bilateral hammer toe amputation  . INCISIONAL HERNIA REPAIR    . KNEE ARTHROSCOPY W/ MENISCAL REPAIR  2007   Bilateral  . POLYPECTOMY  09/18/2017   Procedure:  POLYPECTOMY;  Surgeon: Daneil Dolin, MD;  Location: [facility];  Service: Endoscopy;;  colon  . UMBILICAL HERNIA REPAIR      Social History   Socioeconomic History  . Marital status: Widowed    Spouse name: Not on file  . Number of children: 2  . Years of education: Not on file  . Highest education level: Not on file  Occupational History  . Occupation: Pension scheme manager  . Financial resource strain: Not hard at all  . Food insecurity    Worry: Never true    Inability: Never true  . Transportation needs    Medical: No    Non-medical: No  Tobacco Use  . Smoking status: Never Smoker  . Smokeless tobacco: Never Used  Substance and Sexual Activity  . Alcohol use: No  . Drug use: No  . Sexual activity: Not on file  Lifestyle  . Physical activity    Days per week: 0 days    Minutes per session: 0 min  . Stress: Not at all  Relationships  . Social Herbalist on phone: Never    Gets together: Never    Attends religious  service: Never    Active member of club or organization: No    Attends meetings of clubs or organizations: Never    Relationship status: Widowed  . Intimate partner violence    Fear of current or ex partner: No    Emotionally abused: No    Physically abused: No    Forced sexual activity: No  Other Topics Concern  . Not on file  Social History Narrative  . Not on file   Family History  Adopted: Yes      VITAL SIGNS BP 107/65   Pulse 68   Temp 98.6 F (37 C)   Resp (!) 22   Ht 5\' 6"  (1.676 m)   Wt 191 lb 3.2 oz (86.7 kg)   BMI 30.86 kg/m   Outpatient Encounter Medications as of 10/11/2018  Medication Sig  . acetaminophen (TYLENOL) 325 MG tablet Take 650 mg by mouth every 6 (six) hours as needed.   Marland Kitchen alendronate (FOSAMAX) 70 MG tablet Give 1 tablet by mouth on Sunday. Take with a full glass of water on an empty stomach.  Marland Kitchen apixaban (ELIQUIS) 5 MG TABS tablet Take 5 mg by mouth 2 (two) times daily.   .  carboxymethylcellulose (REFRESH PLUS) 0.5 % SOLN Place 1 drop into both eyes 4 (four) times daily.  . cholecalciferol (VITAMIN D) 1000 UNITS tablet Take 1,000 Units by mouth daily.  . citalopram (CELEXA) 20 MG tablet Take 1 tablet (20 mg total) by mouth daily.  Marland Kitchen Dextromethorphan-guaiFENesin (ROBITUSSIN DM PO) Take 15 mLs by mouth every 6 (six) hours as needed. May give up to 48 hours.  Inform physician immediately if cough or congestion is associated with fever or SOB  . digoxin (LANOXIN) 0.125 MG tablet Take 1 tablet by mouth once a day (HOLD FOR AP UNDER 60)  . furosemide (LASIX) 40 MG tablet Take 40 mg by mouth daily.   . insulin aspart (NOVOLOG) 100 UNIT/ML injection Inject 10 Units into the skin once. Only With Lunch if BS are more then 150  . insulin glargine (LANTUS) 100 UNIT/ML injection Inject 44 Units into the skin at bedtime.  . memantine (NAMENDA) 10 MG tablet Take 10 mg by mouth 2 (two) times daily.  . metolazone (ZAROXOLYN) 2.5 MG tablet Take 5 mg by mouth daily. Mon. Fri   . metoprolol tartrate (LOPRESSOR) 25 MG tablet Take 75 mg by mouth 2 (two) times daily.  . Multiple Vitamin (MULTIVITAMIN WITH MINERALS) TABS Take 1 tablet by mouth every morning.  . NON FORMULARY Diet Type:  NAS, consistent CHO, Dysphagia 3 with thin liquids  Give Cranberry Juice two times daily at 9 am and 5 pm  . omeprazole (PRILOSEC) 40 MG capsule Take 40 mg by mouth daily.  . polyethylene glycol (MIRALAX / GLYCOLAX) packet Take 17 g by mouth daily as needed.   . potassium chloride (MICRO-K) 10 MEQ CR capsule Give 4 tablets (40 mg) by mouth two times daily  . pravastatin (PRAVACHOL) 20 MG tablet Take 20 mg by mouth daily. Take along with 40 mg to = 60 mg  . pravastatin (PRAVACHOL) 40 MG tablet Take 40 mg by mouth daily. Take along with 20 mg to = 60 mg  . sitaGLIPtin-metformin (JANUMET) 50-500 MG tablet Take 1 tablet by mouth 2 (two) times daily with a meal.  . [DISCONTINUED] Cranberry-Vitamin C-Inulin  (UTI-STAT) LIQD Take 30 mLs by mouth 2 (two) times a day.   No facility-administered encounter medications on file as of 10/11/2018.  SIGNIFICANT DIAGNOSTIC EXAMS  LABS REVIEWED PREVIOUS:   11-08-17: tsh 3.269 12-18-17: chol 149; ldl 87; trig 157; hdl 31  01-12-18: dig 0.9 04-26-18: wbc 7.9; hgb 11.7; hct 38.0; mcv 90.9; plt 241; glucose 147; bun 24; creat 1.03 ;k+ 3.6; na++ 138; ca 9.3  05-23-18: hgb a1c 8.0  08-09-18: wbc 10.3 hgb 12.1; hct 39.2 mcv 92.2 ;plt 242; glucose 86 bun 31; creat 1.08; k+ 3.2; na++ 141; ca 9.3; chol 148; ldl 95; trig 107; hdl 32 hgb a1c 7.9 08-23-18: glucose 174; bun 30; creat 0.99; k+ 3.6; na++ 142; ca 9.1   NO NEW LABS.    Review of Systems  Unable to perform ROS: Dementia (unable to participate )     Physical Exam Constitutional:      General: She is not in acute distress.    Appearance: She is well-developed. She is not diaphoretic.  Neck:     Musculoskeletal: Neck supple.     Thyroid: No thyromegaly.  Cardiovascular:     Rate and Rhythm: Normal rate. Rhythm irregular.     Pulses: Normal pulses.     Heart sounds: Normal heart sounds.  Pulmonary:     Effort: Pulmonary effort is normal. No respiratory distress.     Breath sounds: Normal breath sounds.  Abdominal:     General: Bowel sounds are normal. There is no distension.     Palpations: Abdomen is soft.     Tenderness: There is no abdominal tenderness.  Musculoskeletal:     Right lower leg: No edema.     Left lower leg: No edema.     Comments: Trace bilateral lower extremity Is able to move all extremities    Lymphadenopathy:     Cervical: No cervical adenopathy.  Skin:    General: Skin is warm and dry.  Neurological:     Mental Status: She is alert. Mental status is at baseline.  Psychiatric:        Mood and Affect: Mood normal.      ASSESSMENT/ PLAN:  TODAY:   1.  Unspecified dementia with behavorial disturbance: 2. Major depression recurrent chronic:  At this time  will continue her celexa 20 mg daily and will monitor her status.    MD is aware of resident's narcotic use and is in agreement with current plan of care. We will attempt to wean resident as apropriate   Ok Edwards NP [facility]  Contact [phone] Monday through Friday 8am- 5pm  After hours call [phone]

## 2018-10-19 ENCOUNTER — Encounter: Payer: Self-pay | Admitting: Adult Health

## 2018-10-19 ENCOUNTER — Non-Acute Institutional Stay (SKILLED_NURSING_FACILITY): Payer: Medicare Other | Admitting: Adult Health

## 2018-10-19 DIAGNOSIS — E876 Hypokalemia: Secondary | ICD-10-CM | POA: Diagnosis not present

## 2018-10-19 DIAGNOSIS — F0391 Unspecified dementia with behavioral disturbance: Secondary | ICD-10-CM | POA: Diagnosis not present

## 2018-10-19 DIAGNOSIS — K219 Gastro-esophageal reflux disease without esophagitis: Secondary | ICD-10-CM

## 2018-10-19 NOTE — Progress Notes (Signed)
Location:   [place] Room Number: [place] of Service:  SNF (31)   CODE STATUS: DNR  Allergies  Allergen Reactions  . Codeine Anaphylaxis and Hives  . Morphine And Related Anaphylaxis and Hives  . Penicillins Anaphylaxis  . Ace Inhibitors Cough    Chief Complaint  Patient presents with  . Medical Management of Chronic Issues       Hypokalemia:  GERD without esophagitis:  Unspecified dementia with behavioral disturbance:    HPI:  She is a 78 year old long term resident of this facility being seen for the management of her chronic illnesses; hypokalemia; gerd; dementia. There are no reports of agitation or anxiety. There are no reports of changes in her appetite; no reports of uncontrolled pain. There are no reports of fevers present.   Past Medical History:  Diagnosis Date  . Allergy   . Atrial fibrillation ([place]) 08/2011   First diagnosed in 08/2011; duration of arrhythmia is uncertain  . Bilateral lower extremity edema   . Chronic diarrhea    diverticulosis  . COPD (chronic obstructive pulmonary disease) ([place])   . Dementia ([place])   . Diabetes mellitus, type 2 (HCC)    Diabetic neuropathy  . Dyspnea on exertion    pedal edema  . Gout   . Headache(784.0)    twice weekly  . Hyperlipidemia   . Hypertension   . Osteopenia    DEXA scan 01/2010  . Palpitations   . Seasonal allergies   . Stress incontinence   . Vertigo     Past Surgical History:  Procedure Laterality Date  . APPENDECTOMY    . BREAST BIOPSY  2002   Right  . CESAREAN SECTION     x 2  . CHOLECYSTECTOMY    . COLONOSCOPY  2007   Negative screening study  . COLONOSCOPY WITH PROPOFOL N/A 09/18/2017   Procedure: COLONOSCOPY WITH PROPOFOL;  Surgeon: Daneil Dolin, MD;  [facility];  Service: Endoscopy;  Laterality: N/A;  11:00am  . HAMMER TOE SURGERY     Bilateral hammer toe amputation  . INCISIONAL HERNIA REPAIR    . KNEE ARTHROSCOPY W/ MENISCAL REPAIR   2007   Bilateral  . POLYPECTOMY  09/18/2017   Procedure: POLYPECTOMY;  Surgeon: Daneil Dolin, MD;  [facility];  Service: Endoscopy;;  colon  . UMBILICAL HERNIA REPAIR      Social History   Socioeconomic History  . Marital status: Widowed    Spouse name: Not on file  . Number of children: 2  . Years of education: Not on file  . Highest education level: Not on file  Occupational History  . Occupation: Pension scheme manager  . Financial resource strain: Not hard at all  . Food insecurity    Worry: Never true    Inability: Never true  . Transportation needs    Medical: No    Non-medical: No  Tobacco Use  . Smoking status: Never Smoker  . Smokeless tobacco: Never Used  Substance and Sexual Activity  . Alcohol use: No  . Drug use: No  . Sexual activity: Not on file  Lifestyle  . Physical activity    Days per week: 0 days    Minutes per session: 0 min  . Stress: Not at all  Relationships  . Social Herbalist on phone: Never    Gets together: Never    Attends religious service:  Never    Active member of club or organization: No    Attends meetings of clubs or organizations: Never    Relationship status: Widowed  . Intimate partner violence    Fear of current or ex partner: No    Emotionally abused: No    Physically abused: No    Forced sexual activity: No  Other Topics Concern  . Not on file  Social History Narrative  . Not on file   Family History  Adopted: Yes      VITAL SIGNS BP (!) 116/57   Pulse 71   Temp 98.9 F (37.2 C)   Resp 18   Ht 5\' 6"  (1.676 m)   Wt 191 lb 3.2 oz (86.7 kg)   BMI 30.86 kg/m   Outpatient Encounter Medications as of 10/19/2018  Medication Sig  . acetaminophen (TYLENOL) 325 MG tablet Take 650 mg by mouth every 6 (six) hours as needed.   Marland Kitchen alendronate (FOSAMAX) 70 MG tablet Give 1 tablet by mouth on Sunday. Take with a full glass of water on an empty stomach.  Marland Kitchen apixaban (ELIQUIS) 5 MG TABS  tablet Take 5 mg by mouth 2 (two) times daily.   . carboxymethylcellulose (REFRESH PLUS) 0.5 % SOLN Place 1 drop into both eyes 4 (four) times daily.  . cholecalciferol (VITAMIN D) 1000 UNITS tablet Take 1,000 Units by mouth daily.  . citalopram (CELEXA) 20 MG tablet Take 1 tablet (20 mg total) by mouth daily.  Marland Kitchen Dextromethorphan-guaiFENesin (ROBITUSSIN DM PO) Take 15 mLs by mouth every 6 (six) hours as needed. May give up to 48 hours.  Inform physician immediately if cough or congestion is associated with fever or SOB  . digoxin (LANOXIN) 0.125 MG tablet Take 1 tablet by mouth once a day (HOLD FOR AP UNDER 60)  . furosemide (LASIX) 40 MG tablet Take 40 mg by mouth daily.   . insulin aspart (NOVOLOG) 100 UNIT/ML injection Inject 12 Units into the skin once. Only With Lunch if BS are more then 150   . insulin glargine (LANTUS) 100 UNIT/ML injection Inject 46 Units into the skin at bedtime.   . memantine (NAMENDA) 10 MG tablet Take 10 mg by mouth 2 (two) times daily.  . metolazone (ZAROXOLYN) 2.5 MG tablet Take 5 mg by mouth daily. Mon. Fri   . metoprolol tartrate (LOPRESSOR) 25 MG tablet Take 75 mg by mouth 2 (two) times daily.  . Multiple Vitamin (MULTIVITAMIN WITH MINERALS) TABS Take 1 tablet by mouth every morning.  . NON FORMULARY Diet Type:  NAS, consistent CHO, Dysphagia 3 with thin liquids  Give Cranberry Juice two times daily at 9 am and 5 pm  . omeprazole (PRILOSEC) 40 MG capsule Take 40 mg by mouth daily.  . polyethylene glycol (MIRALAX / GLYCOLAX) packet Take 17 g by mouth daily as needed.   . potassium chloride (MICRO-K) 10 MEQ CR capsule Give 4 tablets (40 mg) by mouth two times daily  . pravastatin (PRAVACHOL) 20 MG tablet Take 20 mg by mouth daily. Take along with 40 mg to = 60 mg  . pravastatin (PRAVACHOL) 40 MG tablet Take 40 mg by mouth daily. Take along with 20 mg to = 60 mg  . sitaGLIPtin-metformin (JANUMET) 50-500 MG tablet Take 1 tablet by mouth 2 (two) times daily with a  meal.   No facility-administered encounter medications on file as of 10/19/2018.      SIGNIFICANT DIAGNOSTIC EXAMS  LABS REVIEWED PREVIOUS:   11-08-17: tsh 3.269  12-18-17: chol 149; ldl 87; trig 157; hdl 31  01-12-18: dig 0.9 04-26-18: wbc 7.9; hgb 11.7; hct 38.0; mcv 90.9; plt 241; glucose 147; bun 24; creat 1.03 ;k+ 3.6; na++ 138; ca 9.3  05-23-18: hgb a1c 8.0  08-09-18: wbc 10.3 hgb 12.1; hct 39.2 mcv 92.2 ;plt 242; glucose 86 bun 31; creat 1.08; k+ 3.2; na++ 141; ca 9.3; chol 148; ldl 95; trig 107; hdl 32 hgb a1c 7.9 08-23-18: glucose 174; bun 30; creat 0.99; k+ 3.6; na++ 142; ca 9.1   TODAY;   10-09-18: wbc 8.3; hgb 11.9; hct 38.2; mcv 91.6; plt 232; gluocse 203; bun 29; creat 1.06; k+ 3.5;na++ 137; ca 8.2; tsh 2.608; dig 1.0     Review of Systems  Unable to perform ROS: Dementia (unable to participate )     Physical Exam Constitutional:      General: She is not in acute distress.    Appearance: She is well-developed. She is not diaphoretic.  Neck:     Musculoskeletal: Neck supple.     Thyroid: No thyromegaly.  Cardiovascular:     Rate and Rhythm: Normal rate. Rhythm irregular.     Pulses: Normal pulses.     Heart sounds: Normal heart sounds.  Pulmonary:     Effort: Pulmonary effort is normal. No respiratory distress.     Breath sounds: Normal breath sounds.  Abdominal:     General: Bowel sounds are normal. There is no distension.     Palpations: Abdomen is soft.     Tenderness: There is no abdominal tenderness.  Musculoskeletal:     Right lower leg: Edema present.     Left lower leg: Edema present.     Comments: Trace bilateral lower extremity Is able to move all extremities     Lymphadenopathy:     Cervical: No cervical adenopathy.  Skin:    General: Skin is warm and dry.  Neurological:     Mental Status: She is alert. Mental status is at baseline.  Psychiatric:        Mood and Affect: Mood normal.      ASSESSMENT/ PLAN:  TODAY:   1. Hypokalemia: is  stable k+ 3.5; will continue k+ 40 meq twice daily   2. GERD without esophagitis: is stable will continue prilosec 40 mg daily   3. Unspecified dementia with behavioral disturbance: is without change: weight is 191 (previous 192, 194, 195) pounds; will continue namenda 10 mg twice daily will monitor     PREVIOUS   4. 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 Major depression recurrent chronic: is emotionally stable will continue celexa 20 mg daily   6. Type 2 diabetes uncontrolled with peripheral circulatory disorder:  hgb a1c 7.9 (8.0) is stable will continue basaglar 48 units nightly junamet 50/500 mg twice daily; and novolog 16 units with meals hold for cbg <150  7. Dyslipidemia associated with type 2 diabetes: is stable LDL 95 (87); will continue pravachol 60 mg daily   8. CKD stage 2 due to type 2 diabetes mellitus: is stable bun 29 creat 1.06  9. Hypertension associated with stage 2 chronic kidney disease due to type 2 diabetes mellitus: is stable b/p 116/57 will continue lopressor 75 mg twice daily  10. Chronic atrial fibrillation heart rate stable will continue digoxin 0.125 mg daily lopressor 75 mg twice daily for rate control and eliquis 5 mg twice  daily   11. Bilateral lower extremity edema; is stable will continue lasix 40 mg daily and zaroxolyn 5 mg twice weekly      MD is aware of resident's narcotic use and is in agreement with current plan of care. We will attempt to wean resident as apropriate   Ok Edwards NP [facility]  Contact [phone] Monday through Friday 8am- 5pm  After hours call [phone]

## 2018-11-07 ENCOUNTER — Encounter: Payer: Self-pay | Admitting: Adult Health

## 2018-11-07 ENCOUNTER — Non-Acute Institutional Stay (SKILLED_NURSING_FACILITY): Payer: Medicare Other | Admitting: Adult Health

## 2018-11-07 DIAGNOSIS — F0391 Unspecified dementia with behavioral disturbance: Secondary | ICD-10-CM

## 2018-11-07 DIAGNOSIS — F339 Major depressive disorder, recurrent, unspecified: Secondary | ICD-10-CM

## 2018-11-07 DIAGNOSIS — I482 Chronic atrial fibrillation, unspecified: Secondary | ICD-10-CM

## 2018-11-07 NOTE — Progress Notes (Signed)
Location:   [facility] Room Number: [place] of Service:  SNF (31)   CODE STATUS: DNR  Allergies  Allergen Reactions  . Codeine Anaphylaxis and Hives  . Morphine And Related Anaphylaxis and Hives  . Penicillins Anaphylaxis  . Ace Inhibitors Cough    Chief Complaint  Patient presents with  . Acute Visit    Care Plan Meeting    HPI:  We have come together for her care plan meeting. BIMS 5/13. Psych services has been asked for. She is having increased difficulty feeding herself is spilling her food; there has been a therapy referral. There are no reports of recent falls. She has lost 9 pounds over the past 3 months.  There are no reports of uncontrolled pain; no agitation or anxiety.   Past Medical History:  Diagnosis Date  . Allergy   . Atrial fibrillation ([place]) 08/2011   First diagnosed in 08/2011; duration of arrhythmia is uncertain  . Bilateral lower extremity edema   . Chronic diarrhea    diverticulosis  . COPD (chronic obstructive pulmonary disease) ([place])   . Dementia ([place])   . Diabetes mellitus, type 2 (HCC)    Diabetic neuropathy  . Dyspnea on exertion    pedal edema  . Gout   . Headache(784.0)    twice weekly  . Hyperlipidemia   . Hypertension   . Osteopenia    DEXA scan 01/2010  . Palpitations   . Seasonal allergies   . Stress incontinence   . Vertigo     Past Surgical History:  Procedure Laterality Date  . APPENDECTOMY    . BREAST BIOPSY  2002   Right  . CESAREAN SECTION     x 2  . CHOLECYSTECTOMY    . COLONOSCOPY  2007   Negative screening study  . COLONOSCOPY WITH PROPOFOL N/A 09/18/2017   Procedure: COLONOSCOPY WITH PROPOFOL;  Surgeon: Daneil Dolin, MD;  [facility]: [facility];  Service: Endoscopy;  Laterality: N/A;  11:00am  . HAMMER TOE SURGERY     Bilateral hammer toe amputation  . INCISIONAL HERNIA REPAIR    . KNEE ARTHROSCOPY W/ MENISCAL REPAIR  2007   Bilateral  . POLYPECTOMY  09/18/2017   Procedure: POLYPECTOMY;  Surgeon: Daneil Dolin, MD;  Location: [facility];  Service: Endoscopy;;  colon  . UMBILICAL HERNIA REPAIR      Social History   Socioeconomic History  . Marital status: Widowed    Spouse name: Not on file  . Number of children: 2  . Years of education: Not on file  . Highest education level: Not on file  Occupational History  . Occupation: Pension scheme manager  . Financial resource strain: Not hard at all  . Food insecurity    Worry: Never true    Inability: Never true  . Transportation needs    Medical: No    Non-medical: No  Tobacco Use  . Smoking status: Never Smoker  . Smokeless tobacco: Never Used  Substance and Sexual Activity  . Alcohol use: No  . Drug use: No  . Sexual activity: Not on file  Lifestyle  . Physical activity    Days per week: 0 days    Minutes per session: 0 min  . Stress: Not at all  Relationships  . Social Herbalist on phone: Never    Gets together: Never    Attends religious service: Never    Active  member of club or organization: No    Attends meetings of clubs or organizations: Never    Relationship status: Widowed  . Intimate partner violence    Fear of current or ex partner: No    Emotionally abused: No    Physically abused: No    Forced sexual activity: No  Other Topics Concern  . Not on file  Social History Narrative  . Not on file   Family History  Adopted: Yes      VITAL SIGNS Ht 5\' 6"  (1.676 m)   Wt 185 lb 3.2 oz (84 kg)   BMI 29.89 kg/m   Outpatient Encounter Medications as of 11/07/2018  Medication Sig  . acetaminophen (TYLENOL) 325 MG tablet Take 650 mg by mouth every 6 (six) hours as needed.   Marland Kitchen alendronate (FOSAMAX) 70 MG tablet Give 1 tablet by mouth on Sunday. Take with a full glass of water on an empty stomach.  Marland Kitchen apixaban (ELIQUIS) 5 MG TABS tablet Take 5 mg by mouth 2 (two) times daily.   . carboxymethylcellulose (REFRESH PLUS) 0.5 % SOLN Place 1 drop  into both eyes 4 (four) times daily.  . cholecalciferol (VITAMIN D) 1000 UNITS tablet Take 1,000 Units by mouth daily.  . citalopram (CELEXA) 20 MG tablet Take 1 tablet (20 mg total) by mouth daily.  Marland Kitchen Dextromethorphan-guaiFENesin (ROBITUSSIN DM PO) Take 15 mLs by mouth every 6 (six) hours as needed. May give up to 48 hours.  Inform physician immediately if cough or congestion is associated with fever or SOB  . digoxin (LANOXIN) 0.125 MG tablet Take 1 tablet by mouth once a day (HOLD FOR AP UNDER 60)  . furosemide (LASIX) 40 MG tablet Take 40 mg by mouth daily.   . insulin aspart (NOVOLOG) 100 UNIT/ML injection Inject 12 Units into the skin once. Only With Lunch if BS are more then 150   . insulin glargine (LANTUS) 100 UNIT/ML injection Inject 46 Units into the skin at bedtime.   . memantine (NAMENDA) 10 MG tablet Take 10 mg by mouth 2 (two) times daily.  . metolazone (ZAROXOLYN) 2.5 MG tablet Take 5 mg by mouth daily. Mon. Fri   . metoprolol tartrate (LOPRESSOR) 25 MG tablet Take 75 mg by mouth 2 (two) times daily.  . Multiple Vitamin (MULTIVITAMIN WITH MINERALS) TABS Take 1 tablet by mouth every morning.  . NON FORMULARY Diet Type:  NAS, consistent CHO, Dysphagia 3 with thin liquids  Give Cranberry Juice two times daily at 9 am and 5 pm  . omeprazole (PRILOSEC) 40 MG capsule Take 40 mg by mouth daily.  . polyethylene glycol (MIRALAX / GLYCOLAX) packet Take 17 g by mouth daily as needed.   . potassium chloride (MICRO-K) 10 MEQ CR capsule Give 4 tablets (40 mg) by mouth two times daily  . pravastatin (PRAVACHOL) 20 MG tablet Take 20 mg by mouth daily. Take along with 40 mg to = 60 mg  . pravastatin (PRAVACHOL) 40 MG tablet Take 40 mg by mouth daily. Take along with 20 mg to = 60 mg  . sitaGLIPtin-metformin (JANUMET) 50-500 MG tablet Take 1 tablet by mouth 2 (two) times daily with a meal.   No facility-administered encounter medications on file as of 11/07/2018.      SIGNIFICANT DIAGNOSTIC  EXAMS  LABS REVIEWED PREVIOUS:   11-08-17: tsh 3.269 12-18-17: chol 149; ldl 87; trig 157; hdl 31  01-12-18: dig 0.9 04-26-18: wbc 7.9; hgb 11.7; hct 38.0; mcv 90.9; plt 241; glucose  147; bun 24; creat 1.03 ;k+ 3.6; na++ 138; ca 9.3  05-23-18: hgb a1c 8.0  08-09-18: wbc 10.3 hgb 12.1; hct 39.2 mcv 92.2 ;plt 242; glucose 86 bun 31; creat 1.08; k+ 3.2; na++ 141; ca 9.3; chol 148; ldl 95; trig 107; hdl 32 hgb a1c 7.9 08-23-18: glucose 174; bun 30; creat 0.99; k+ 3.6; na++ 142; ca 9.1  10-09-18: wbc 8.3; hgb 11.9; hct 38.2; mcv 91.6; plt 232; gluocse 203; bun 29; creat 1.06; k+ 3.5;na++ 137; ca 8.2; tsh 2.608; dig 1.0    NO NEW LABS.     Review of Systems  Unable to perform ROS: Dementia (unable to participate )     Physical Exam Constitutional:      General: She is not in acute distress.    Appearance: She is well-developed. She is not diaphoretic.  Neck:     Musculoskeletal: Neck supple.     Thyroid: No thyromegaly.  Cardiovascular:     Rate and Rhythm: Normal rate. Rhythm irregular.     Pulses: Normal pulses.     Heart sounds: Normal heart sounds.  Pulmonary:     Effort: Pulmonary effort is normal. No respiratory distress.     Breath sounds: Normal breath sounds.  Abdominal:     General: Bowel sounds are normal. There is no distension.     Palpations: Abdomen is soft.     Tenderness: There is no abdominal tenderness.  Musculoskeletal: Normal range of motion.     Right lower leg: Edema present.     Left lower leg: Edema present.     Comments: Trace bilateral lower extremity Is able to move all extremities      Lymphadenopathy:     Cervical: No cervical adenopathy.  Skin:    General: Skin is warm and dry.  Neurological:     Mental Status: She is alert. Mental status is at baseline.  Psychiatric:        Mood and Affect: Mood normal.       ASSESSMENT/ PLAN:  TODAY:   1. Unspecified dementia with behavioral disturbance 2. Major depression recurrent chronic 3. Chronic  atrial fibrillation  Will continue current medications Will continue plan of care Will continue to monitor her status.      MD is aware of resident's narcotic use and is in agreement with current plan of care. We will attempt to wean resident as apropriate   Ok Edwards NP [facility]  Contact [phone] Monday through Friday 8am- 5pm  After hours call [phone]

## 2018-11-09 DIAGNOSIS — G301 Alzheimer's disease with late onset: Secondary | ICD-10-CM | POA: Diagnosis not present

## 2018-11-09 DIAGNOSIS — F331 Major depressive disorder, recurrent, moderate: Secondary | ICD-10-CM | POA: Diagnosis not present

## 2018-11-13 ENCOUNTER — Ambulatory Visit (HOSPITAL_COMMUNITY): Payer: Medicare Other | Attending: Internal Medicine

## 2018-11-13 ENCOUNTER — Encounter: Payer: Self-pay | Admitting: Adult Health

## 2018-11-13 ENCOUNTER — Non-Acute Institutional Stay (SKILLED_NURSING_FACILITY): Payer: Medicare Other | Admitting: Adult Health

## 2018-11-13 DIAGNOSIS — I482 Chronic atrial fibrillation, unspecified: Secondary | ICD-10-CM

## 2018-11-13 DIAGNOSIS — R001 Bradycardia, unspecified: Secondary | ICD-10-CM | POA: Diagnosis not present

## 2018-11-13 DIAGNOSIS — R0902 Hypoxemia: Secondary | ICD-10-CM | POA: Diagnosis not present

## 2018-11-13 DIAGNOSIS — R7981 Abnormal blood-gas level: Secondary | ICD-10-CM | POA: Diagnosis not present

## 2018-11-13 NOTE — Progress Notes (Signed)
Location:   [facility] Room Number: [place] of Service:  SNF (31)   CODE STATUS: DNR  Allergies  Allergen Reactions  . Codeine Anaphylaxis and Hives  . Morphine And Related Anaphylaxis and Hives  . Penicillins Anaphylaxis  . Ace Inhibitors Cough    Chief Complaint  Patient presents with  . Acute Visit    Status    HPI:  She had a fall over the past weekend without injury. She had at that time pulse OX 86-88% on room air with a pulse of 53-59.  There are no reports of cough or shortness of breath present. There are no reports of fevers present.    Past Medical History:  Diagnosis Date  . Allergy   . Atrial fibrillation ([place]) 08/2011   First diagnosed in 08/2011; duration of arrhythmia is uncertain  . Bilateral lower extremity edema   . Chronic diarrhea    diverticulosis  . COPD (chronic obstructive pulmonary disease) ([place])   . Dementia ([place])   . Diabetes mellitus, type 2 (HCC)    Diabetic neuropathy  . Dyspnea on exertion    pedal edema  . Gout   . Headache(784.0)    twice weekly  . Hyperlipidemia   . Hypertension   . Osteopenia    DEXA scan 01/2010  . Palpitations   . Seasonal allergies   . Stress incontinence   . Vertigo     Past Surgical History:  Procedure Laterality Date  . APPENDECTOMY    . BREAST BIOPSY  2002   Right  . CESAREAN SECTION     x 2  . CHOLECYSTECTOMY    . COLONOSCOPY  2007   Negative screening study  . COLONOSCOPY WITH PROPOFOL N/A 09/18/2017   Procedure: COLONOSCOPY WITH PROPOFOL;  Surgeon: Daneil Dolin, MD;  [facility];  Service: Endoscopy;  Laterality: N/A;  11:00am  . HAMMER TOE SURGERY     Bilateral hammer toe amputation  . INCISIONAL HERNIA REPAIR    . KNEE ARTHROSCOPY W/ MENISCAL REPAIR  2007   Bilateral  . POLYPECTOMY  09/18/2017   Procedure: POLYPECTOMY;  Surgeon: Daneil Dolin, MD;  [facility];  Service: Endoscopy;;  colon  . UMBILICAL HERNIA REPAIR       Social History   Socioeconomic History  . Marital status: Widowed    Spouse name: Not on file  . Number of children: 2  . Years of education: Not on file  . Highest education level: Not on file  Occupational History  . Occupation: Pension scheme manager  . Financial resource strain: Not hard at all  . Food insecurity    Worry: Never true    Inability: Never true  . Transportation needs    Medical: No    Non-medical: No  Tobacco Use  . Smoking status: Never Smoker  . Smokeless tobacco: Never Used  Substance and Sexual Activity  . Alcohol use: No  . Drug use: No  . Sexual activity: Not on file  Lifestyle  . Physical activity    Days per week: 0 days    Minutes per session: 0 min  . Stress: Not at all  Relationships  . Social Herbalist on phone: Never    Gets together: Never    Attends religious service: Never    Active member of club or organization: No    Attends meetings of clubs or organizations: Never  Relationship status: Widowed  . Intimate partner violence    Fear of current or ex partner: No    Emotionally abused: No    Physically abused: No    Forced sexual activity: No  Other Topics Concern  . Not on file  Social History Narrative  . Not on file   Family History  Adopted: Yes      VITAL SIGNS BP 105/60   Pulse 66   Temp 98.6 F (37 C)   Resp 18   Ht 5\' 6"  (1.676 m)   Wt 185 lb 3.2 oz (84 kg)   BMI 29.89 kg/m   Outpatient Encounter Medications as of 11/13/2018  Medication Sig  . acetaminophen (TYLENOL) 325 MG tablet Take 650 mg by mouth every 6 (six) hours as needed.   Marland Kitchen alendronate (FOSAMAX) 70 MG tablet Give 1 tablet by mouth on Sunday. Take with a full glass of water on an empty stomach.  Marland Kitchen apixaban (ELIQUIS) 5 MG TABS tablet Take 5 mg by mouth 2 (two) times daily.   . carboxymethylcellulose (REFRESH PLUS) 0.5 % SOLN Place 1 drop into both eyes 4 (four) times daily.  . cholecalciferol (VITAMIN D) 1000 UNITS tablet  Take 1,000 Units by mouth daily.  . citalopram (CELEXA) 20 MG tablet Take 1 tablet (20 mg total) by mouth daily.  Marland Kitchen Dextromethorphan-guaiFENesin (ROBITUSSIN DM PO) Take 15 mLs by mouth every 6 (six) hours as needed. May give up to 48 hours.  Inform physician immediately if cough or congestion is associated with fever or SOB  . digoxin (LANOXIN) 0.125 MG tablet Take 1 tablet by mouth once a day (HOLD FOR AP UNDER 60)  . furosemide (LASIX) 40 MG tablet Take 40 mg by mouth daily.   . insulin aspart (NOVOLOG) 100 UNIT/ML injection Inject 12 Units into the skin once. Only With Lunch if BS are more then 150   . insulin glargine (LANTUS) 100 UNIT/ML injection Inject 46 Units into the skin at bedtime.   . memantine (NAMENDA) 10 MG tablet Take 10 mg by mouth 2 (two) times daily.  . metolazone (ZAROXOLYN) 2.5 MG tablet Take 5 mg by mouth daily. Mon. Fri   . metoprolol tartrate (LOPRESSOR) 25 MG tablet Take 75 mg by mouth 2 (two) times daily.  . Multiple Vitamin (MULTIVITAMIN WITH MINERALS) TABS Take 1 tablet by mouth every morning.  . NON FORMULARY Diet Type:  NAS, consistent CHO, Dysphagia 3 with thin liquids  Give Cranberry Juice two times daily at 9 am and 5 pm  . omeprazole (PRILOSEC) 40 MG capsule Take 40 mg by mouth daily.  . polyethylene glycol (MIRALAX / GLYCOLAX) packet Take 17 g by mouth daily as needed.   . potassium chloride (MICRO-K) 10 MEQ CR capsule Give 4 tablets (40 mg) by mouth two times daily  . pravastatin (PRAVACHOL) 20 MG tablet Take 20 mg by mouth daily. Take along with 40 mg to = 60 mg  . pravastatin (PRAVACHOL) 40 MG tablet Take 40 mg by mouth daily. Take along with 20 mg to = 60 mg  . sitaGLIPtin-metformin (JANUMET) 50-500 MG tablet Take 1 tablet by mouth 2 (two) times daily with a meal.   No facility-administered encounter medications on file as of 11/13/2018.      SIGNIFICANT DIAGNOSTIC EXAMS  LABS REVIEWED PREVIOUS:   11-08-17: tsh 3.269 12-18-17: chol 149; ldl 87; trig  157; hdl 31  01-12-18: dig 0.9 04-26-18: wbc 7.9; hgb 11.7; hct 38.0; mcv 90.9; plt 241; glucose  147; bun 24; creat 1.03 ;k+ 3.6; na++ 138; ca 9.3  05-23-18: hgb a1c 8.0  08-09-18: wbc 10.3 hgb 12.1; hct 39.2 mcv 92.2 ;plt 242; glucose 86 bun 31; creat 1.08; k+ 3.2; na++ 141; ca 9.3; chol 148; ldl 95; trig 107; hdl 32 hgb a1c 7.9 08-23-18: glucose 174; bun 30; creat 0.99; k+ 3.6; na++ 142; ca 9.1  10-09-18: wbc 8.3; hgb 11.9; hct 38.2; mcv 91.6; plt 232; gluocse 203; bun 29; creat 1.06; k+ 3.5;na++ 137; ca 8.2; tsh 2.608; dig 1.0    NO NEW LABS.   Review of Systems  Unable to perform ROS: Dementia (unable to participate )     Physical Exam Constitutional:      General: She is not in acute distress.    Appearance: She is well-developed. She is not diaphoretic.  Neck:     Musculoskeletal: Neck supple.     Thyroid: No thyromegaly.  Cardiovascular:     Rate and Rhythm: Normal rate. Rhythm irregular.     Pulses: Normal pulses.     Heart sounds: Normal heart sounds.  Pulmonary:     Effort: Pulmonary effort is normal. No respiratory distress.     Breath sounds: Normal breath sounds.  Abdominal:     General: Bowel sounds are normal. There is no distension.     Palpations: Abdomen is soft.     Tenderness: There is no abdominal tenderness.  Musculoskeletal:     Right lower leg: No edema.     Left lower leg: No edema.     Comments: Is able to move all extremities   Lymphadenopathy:     Cervical: No cervical adenopathy.  Skin:    General: Skin is warm and dry.  Neurological:     Mental Status: She is alert. Mental status is at baseline.  Psychiatric:        Mood and Affect: Mood normal.       ASSESSMENT/ PLAN:  TODAY:   1.  Chronic atrial fibrillation 2. Bradycardia 3. Hypoxemia  Will check dig level Will get chest x-ray Will lower lopressor to 50 mg twice daily  Will monitor her status.      MD is aware of resident's narcotic use and is in agreement with current plan  of care. We will attempt to wean resident as apropriate   Ok Edwards NP [facility]  Contact [phone] Monday through Friday 8am- 5pm  After hours call [phone]

## 2018-11-14 ENCOUNTER — Other Ambulatory Visit (HOSPITAL_COMMUNITY)
Admission: RE | Admit: 2018-11-14 | Discharge: 2018-11-14 | Disposition: A | Discharge status: Home / Self Care | Payer: Medicare Other | Source: Skilled Nursing Facility | Attending: Adult Health | Admitting: Adult Health

## 2018-11-14 ENCOUNTER — Non-Acute Institutional Stay (SKILLED_NURSING_FACILITY): Payer: Medicare Other | Admitting: Internal Medicine

## 2018-11-14 ENCOUNTER — Encounter: Payer: Self-pay | Admitting: Internal Medicine

## 2018-11-14 DIAGNOSIS — I10 Essential (primary) hypertension: Secondary | ICD-10-CM | POA: Diagnosis not present

## 2018-11-14 DIAGNOSIS — K219 Gastro-esophageal reflux disease without esophagitis: Secondary | ICD-10-CM

## 2018-11-14 DIAGNOSIS — R0902 Hypoxemia: Secondary | ICD-10-CM | POA: Insufficient documentation

## 2018-11-14 DIAGNOSIS — R001 Bradycardia, unspecified: Secondary | ICD-10-CM | POA: Insufficient documentation

## 2018-11-14 DIAGNOSIS — I482 Chronic atrial fibrillation, unspecified: Secondary | ICD-10-CM

## 2018-11-14 DIAGNOSIS — I480 Paroxysmal atrial fibrillation: Secondary | ICD-10-CM | POA: Insufficient documentation

## 2018-11-14 LAB — DIGOXIN LEVEL: Digoxin Level: 0.9 ng/mL (ref 0.8–2.0)

## 2018-11-14 NOTE — Progress Notes (Signed)
Location:  [name] Room Number: 113/W Place of Service:  SNF (31)  Hennie Duos, MD  Patient Care Team: Hennie Duos, MD as PCP - General (Internal Medicine) Rothbart, Cristopher Estimable, MD (Cardiology) [facility] [place] ([place]) [name], Phylis Bougie, NP as Nurse Practitioner (Geriatric Medicine)  Extended Emergency Contact Information Primary Emergency Contact: [facility] Address: [address]          [place], [place] [address] [name] of [place] Phone: [phone] Mobile Phone: [phone] Relation: Daughter   Chief Complaint  Patient presents with  . Medical Management of Chronic Issues    Routine visit of medical management  . Quality Metric Gaps    Foot Exam, Urine Microalbumin    HPI:  Pt is a 78 y.o. female seen today for routine issues of chronic atrial fibrillation, hypertension, and GERD.   Past Medical History:  Diagnosis Date  . Allergy   . Atrial fibrillation ([place]) 08/2011   First diagnosed in 08/2011; duration of arrhythmia is uncertain  . Bilateral lower extremity edema   . Chronic diarrhea    diverticulosis  . COPD (chronic obstructive pulmonary disease) ([place])   . Dementia ([place])   . Diabetes mellitus, type 2 (HCC)    Diabetic neuropathy  . Dyspnea on exertion    pedal edema  . Gout   . Headache(784.0)    twice weekly  . Hyperlipidemia   . Hypertension   . Osteopenia    DEXA scan 01/2010  . Palpitations   . Seasonal allergies   . Stress incontinence   . Vertigo    Past Surgical History:  Procedure Laterality Date  . APPENDECTOMY    . BREAST BIOPSY  2002   Right  . CESAREAN SECTION     x 2  . CHOLECYSTECTOMY    . COLONOSCOPY  2007   Negative screening study  . COLONOSCOPY WITH PROPOFOL N/A 09/18/2017   Procedure: COLONOSCOPY WITH PROPOFOL;  Surgeon: Daneil Dolin, MD;  [facility];  Service: Endoscopy;  Laterality: N/A;  11:00am  . HAMMER TOE SURGERY     Bilateral hammer toe amputation  . INCISIONAL HERNIA REPAIR    . KNEE ARTHROSCOPY W/ MENISCAL REPAIR  2007   Bilateral  . POLYPECTOMY  09/18/2017   Procedure: POLYPECTOMY;  Surgeon: Daneil Dolin, MD;  [facility];  Service: Endoscopy;;  colon  . UMBILICAL HERNIA REPAIR      Allergies  Allergen Reactions  . Codeine Anaphylaxis and Hives  . Morphine And Related Anaphylaxis and Hives  . Penicillins Anaphylaxis  . Ace Inhibitors Cough    Outpatient Encounter Medications as of 11/14/2018  Medication Sig  . acetaminophen (TYLENOL) 325 MG tablet Take 650 mg by mouth every 6 (six) hours as needed.   Marland Kitchen alendronate (FOSAMAX) 70 MG tablet Give 1 tablet by mouth on Sunday. Take with a full glass of water on an empty stomach.  Marland Kitchen apixaban (ELIQUIS) 5 MG TABS tablet Take 5 mg by mouth 2 (two) times daily.   . carboxymethylcellulose (REFRESH PLUS) 0.5 % SOLN Place 1 drop into both eyes 4 (four) times daily.  . cholecalciferol (VITAMIN D) 1000 UNITS tablet Take 1,000 Units by mouth daily.  . citalopram (CELEXA) 20 MG tablet Take 1 tablet (20 mg total) by mouth daily.  Marland Kitchen Dextromethorphan-guaiFENesin (ROBITUSSIN DM PO) Take 15 mLs by mouth every 6 (six) hours as needed. May give up to 48 hours.  Inform physician immediately  if cough or congestion is associated with fever or SOB  . digoxin (LANOXIN) 0.125 MG tablet Take 1 tablet by mouth once a day (HOLD FOR AP UNDER 60)  . furosemide (LASIX) 40 MG tablet Take 40 mg by mouth daily.   . insulin aspart (NOVOLOG) 100 UNIT/ML injection Inject 12 Units into the skin once. Only With Lunch if BS are more then 150   . Insulin Glargine (BASAGLAR KWIKPEN) 100 UNIT/ML SOPN Inject 46 Units into the skin at bedtime.  . memantine (NAMENDA) 10 MG tablet Take 10 mg by mouth 2 (two) times daily.  . metolazone (ZAROXOLYN) 2.5 MG tablet Take 5 mg by mouth daily. Mon. Fri   . metoprolol tartrate (LOPRESSOR) 25 MG tablet Take 50 mg by mouth 2 (two) times daily.    . Multiple Vitamin (MULTIVITAMIN WITH MINERALS) TABS Take 1 tablet by mouth every morning.  . NON FORMULARY Diet Type:  NAS, consistent CHO, Dysphagia 3 with thin liquids  Give Cranberry Juice two times daily at 9 am and 5 pm  . omeprazole (PRILOSEC) 40 MG capsule Take 40 mg by mouth daily.  . polyethylene glycol (MIRALAX / GLYCOLAX) packet Take 17 g by mouth daily as needed.   . potassium chloride (MICRO-K) 10 MEQ CR capsule Give 4 tablets (40 mg) by mouth two times daily  . pravastatin (PRAVACHOL) 20 MG tablet Take 20 mg by mouth daily. Take along with 40 mg to = 60 mg  . pravastatin (PRAVACHOL) 40 MG tablet Take 40 mg by mouth daily. Take along with 20 mg to = 60 mg  . sitaGLIPtin-metformin (JANUMET) 50-500 MG tablet Take 1 tablet by mouth 2 (two) times daily with a meal.  . [DISCONTINUED] insulin glargine (LANTUS) 100 UNIT/ML injection Inject 46 Units into the skin at bedtime.    No facility-administered encounter medications on file as of 11/14/2018.     Review of Systems  DATA OBTAINED: from patient, nurse GENERAL:  no fevers, fatigue, appetite changes SKIN: No itching, rash HEENT: No complaint RESPIRATORY: No cough, wheezing, SOB CARDIAC: No chest pain, palpitations, lower extremity edema  GI: No abdominal pain, No N/V/D or constipation, No heartburn or reflux  GU: No dysuria, frequency or urgency, or incontinence  MUSCULOSKELETAL: No unrelieved bone/joint pain NEUROLOGIC: No headache, dizziness  PSYCHIATRIC: No overt anxiety or sadness   Immunization History  Administered Date(s) Administered  . Influenza-Unspecified 02/04/2016, 02/03/2017, 02/01/2018  . Pneumococcal Conjugate-13 02/03/2017  . Pneumococcal-Unspecified 02/08/2016  . Tdap 01/19/2017  . Zoster 08/02/2018   Pertinent  Health Maintenance Due  Topic Date Due  . FOOT EXAM  12/10/2018 (Originally 10/31/2018)  . URINE MICROALBUMIN  12/14/2018 (Originally 08/23/2018)  . INFLUENZA VACCINE  12/01/2018  .  HEMOGLOBIN A1C  02/08/2019  . OPHTHALMOLOGY EXAM  03/07/2019  . DEXA SCAN  Completed  . PNA vac Low Risk Adult  Completed   Fall Risk  01/30/2018 01/19/2017 11/06/2015 09/29/2015 08/31/2015  Falls in the past year? No No Yes No No  Number falls in past yr: - - 2 or more - -  Injury with Fall? - - No - -    Vitals:   11/14/18 1434  BP: 99/63  Pulse: 66  Resp: 18  Temp: 98.2 F (36.8 C)  TempSrc: Oral  SpO2: 98%  Weight: 185 lb 3.2 oz (84 kg)  Height: 5\' 6"  (1.676 m)   Body mass index is 29.89 kg/m.  Physical Exam  GENERAL APPEARANCE: Alert, conversant, No acute distress  SKIN: No  diaphoresis rash HEENT: Unremarkable RESPIRATORY: Breathing is even, unlabored. Lung sounds are clear   CARDIOVASCULAR: Heart irregular no murmurs, rubs or gallops. No peripheral edema  GASTROINTESTINAL: Abdomen is soft, non-tender, not distended w/ normal bowel sounds.  GENITOURINARY: Bladder non tender, not distended  MUSCULOSKELETAL: No abnormal joints or musculature NEUROLOGIC: Cranial nerves 2-12 grossly intact. Moves all extremities PSYCHIATRIC: Mood and affect dementia, no behavioral issues  Labs reviewed: Recent Labs    08/09/18 0318 08/23/18 0500 10/09/18 0702  NA 141 142 137  K 3.2* 3.6 3.5  CL 99 105 99  CO2 32 30 29  GLUCOSE 86 174* 203*  BUN 31* 30* 29*  CREATININE 1.08* 0.99 1.06*  CALCIUM 9.3 9.1 9.2   No results for input(s): AST, ALT, ALKPHOS, BILITOT, PROT, ALBUMIN in the last 8760 hours. Recent Labs    04/26/18 0740 08/09/18 0318 10/09/18 0702  WBC 7.9 10.3 8.3  NEUTROABS 4.8 6.4 5.3  HGB 11.7* 12.1 11.9*  HCT 38.0 39.2 38.2  MCV 90.9 92.2 91.6  PLT 241 242 232   Recent Labs    12/18/17 0700 08/09/18 0318  CHOL 149 148  LDLCALC 87 95  TRIG 157* 107   Lab Results  Component Value Date   MICROALBUR 3.7 (H) 05/11/2016   Lab Results  Component Value Date   TSH 2.608 10/09/2018   Lab Results  Component Value Date   HGBA1C 7.9 (H) 08/09/2018   Lab  Results  Component Value Date   CHOL 148 08/09/2018   HDL 32 (L) 08/09/2018   LDLCALC 95 08/09/2018   TRIG 107 08/09/2018   CHOLHDL 4.6 08/09/2018    Significant Diagnostic Results in last 30 days:  Dg Chest 2 View  Result Date: 11/13/2018 CLINICAL DATA:  Low O2 sats today. EXAM: CHEST - 2 VIEW COMPARISON:  Chest x-rays dated 01/13/2016 and 02/05/2015. FINDINGS: Stable cardiomegaly. Overall cardiomediastinal silhouette is grossly stable in size and configuration. Lungs are clear. No pleural effusion or pneumothorax seen no acute or suspicious osseous finding. Degenerative changes again noted within the a thoracolumbar spine. Stable levoscoliosis of the thoracolumbar spine. IMPRESSION: 1. No active cardiopulmonary disease. No evidence of pneumonia or pulmonary edema. 2. Stable cardiomegaly. Electronically Signed   By: Franki Cabot M.D.   On: 11/13/2018 11:22    Assessment/Plan Chronic atrial fibrillation (HCC) Chronic and stable; continue digoxin 0.125 mg 1 daily and metoprolol 50 mg twice daily for rate and Eliquis 5 mg twice daily as prophylaxis  Hypertension Controlled; continue Lopressor 50 mg twice daily  GERD without esophagitis No complaints; continue omeprazole 40 mg daily      Orpheus Hayhurst D. Sheppard Coil MD

## 2018-11-17 ENCOUNTER — Encounter: Payer: Self-pay | Admitting: Internal Medicine

## 2018-11-17 NOTE — Assessment & Plan Note (Signed)
Controlled; continue Lopressor 50 mg twice daily

## 2018-11-17 NOTE — Assessment & Plan Note (Signed)
No complaints; continue omeprazole 40 mg daily

## 2018-11-17 NOTE — Assessment & Plan Note (Signed)
Chronic and stable; continue digoxin 0.125 mg 1 daily and metoprolol 50 mg twice daily for rate and Eliquis 5 mg twice daily as prophylaxis

## 2018-11-19 DIAGNOSIS — M810 Age-related osteoporosis without current pathological fracture: Secondary | ICD-10-CM | POA: Diagnosis not present

## 2018-11-19 DIAGNOSIS — R279 Unspecified lack of coordination: Secondary | ICD-10-CM | POA: Diagnosis not present

## 2018-11-19 DIAGNOSIS — F0390 Unspecified dementia without behavioral disturbance: Secondary | ICD-10-CM | POA: Diagnosis not present

## 2018-11-19 DIAGNOSIS — M6281 Muscle weakness (generalized): Secondary | ICD-10-CM | POA: Diagnosis not present

## 2018-11-19 DIAGNOSIS — J449 Chronic obstructive pulmonary disease, unspecified: Secondary | ICD-10-CM | POA: Diagnosis not present

## 2018-11-19 DIAGNOSIS — Z9181 History of falling: Secondary | ICD-10-CM | POA: Diagnosis not present

## 2018-11-19 DIAGNOSIS — R633 Feeding difficulties: Secondary | ICD-10-CM | POA: Diagnosis not present

## 2018-11-20 DIAGNOSIS — M810 Age-related osteoporosis without current pathological fracture: Secondary | ICD-10-CM | POA: Diagnosis not present

## 2018-11-20 DIAGNOSIS — R279 Unspecified lack of coordination: Secondary | ICD-10-CM | POA: Diagnosis not present

## 2018-11-20 DIAGNOSIS — Z9181 History of falling: Secondary | ICD-10-CM | POA: Diagnosis not present

## 2018-11-20 DIAGNOSIS — F0390 Unspecified dementia without behavioral disturbance: Secondary | ICD-10-CM | POA: Diagnosis not present

## 2018-11-20 DIAGNOSIS — J449 Chronic obstructive pulmonary disease, unspecified: Secondary | ICD-10-CM | POA: Diagnosis not present

## 2018-11-20 DIAGNOSIS — M6281 Muscle weakness (generalized): Secondary | ICD-10-CM | POA: Diagnosis not present

## 2018-11-21 DIAGNOSIS — F0390 Unspecified dementia without behavioral disturbance: Secondary | ICD-10-CM | POA: Diagnosis not present

## 2018-11-21 DIAGNOSIS — M810 Age-related osteoporosis without current pathological fracture: Secondary | ICD-10-CM | POA: Diagnosis not present

## 2018-11-21 DIAGNOSIS — R279 Unspecified lack of coordination: Secondary | ICD-10-CM | POA: Diagnosis not present

## 2018-11-21 DIAGNOSIS — Z9181 History of falling: Secondary | ICD-10-CM | POA: Diagnosis not present

## 2018-11-21 DIAGNOSIS — J449 Chronic obstructive pulmonary disease, unspecified: Secondary | ICD-10-CM | POA: Diagnosis not present

## 2018-11-21 DIAGNOSIS — M6281 Muscle weakness (generalized): Secondary | ICD-10-CM | POA: Diagnosis not present

## 2018-11-22 DIAGNOSIS — M6281 Muscle weakness (generalized): Secondary | ICD-10-CM | POA: Diagnosis not present

## 2018-11-22 DIAGNOSIS — Z9181 History of falling: Secondary | ICD-10-CM | POA: Diagnosis not present

## 2018-11-22 DIAGNOSIS — J449 Chronic obstructive pulmonary disease, unspecified: Secondary | ICD-10-CM | POA: Diagnosis not present

## 2018-11-22 DIAGNOSIS — R279 Unspecified lack of coordination: Secondary | ICD-10-CM | POA: Diagnosis not present

## 2018-11-22 DIAGNOSIS — F0390 Unspecified dementia without behavioral disturbance: Secondary | ICD-10-CM | POA: Diagnosis not present

## 2018-11-22 DIAGNOSIS — M810 Age-related osteoporosis without current pathological fracture: Secondary | ICD-10-CM | POA: Diagnosis not present

## 2018-11-23 DIAGNOSIS — J449 Chronic obstructive pulmonary disease, unspecified: Secondary | ICD-10-CM | POA: Diagnosis not present

## 2018-11-23 DIAGNOSIS — M6281 Muscle weakness (generalized): Secondary | ICD-10-CM | POA: Diagnosis not present

## 2018-11-23 DIAGNOSIS — Z9181 History of falling: Secondary | ICD-10-CM | POA: Diagnosis not present

## 2018-11-23 DIAGNOSIS — R279 Unspecified lack of coordination: Secondary | ICD-10-CM | POA: Diagnosis not present

## 2018-11-23 DIAGNOSIS — F0390 Unspecified dementia without behavioral disturbance: Secondary | ICD-10-CM | POA: Diagnosis not present

## 2018-11-23 DIAGNOSIS — M810 Age-related osteoporosis without current pathological fracture: Secondary | ICD-10-CM | POA: Diagnosis not present

## 2018-11-26 DIAGNOSIS — J449 Chronic obstructive pulmonary disease, unspecified: Secondary | ICD-10-CM | POA: Diagnosis not present

## 2018-11-26 DIAGNOSIS — M6281 Muscle weakness (generalized): Secondary | ICD-10-CM | POA: Diagnosis not present

## 2018-11-26 DIAGNOSIS — M810 Age-related osteoporosis without current pathological fracture: Secondary | ICD-10-CM | POA: Diagnosis not present

## 2018-11-26 DIAGNOSIS — F0390 Unspecified dementia without behavioral disturbance: Secondary | ICD-10-CM | POA: Diagnosis not present

## 2018-11-26 DIAGNOSIS — Z9181 History of falling: Secondary | ICD-10-CM | POA: Diagnosis not present

## 2018-11-26 DIAGNOSIS — R279 Unspecified lack of coordination: Secondary | ICD-10-CM | POA: Diagnosis not present

## 2018-11-27 DIAGNOSIS — F0390 Unspecified dementia without behavioral disturbance: Secondary | ICD-10-CM | POA: Diagnosis not present

## 2018-11-27 DIAGNOSIS — M810 Age-related osteoporosis without current pathological fracture: Secondary | ICD-10-CM | POA: Diagnosis not present

## 2018-11-27 DIAGNOSIS — Z9181 History of falling: Secondary | ICD-10-CM | POA: Diagnosis not present

## 2018-11-27 DIAGNOSIS — J449 Chronic obstructive pulmonary disease, unspecified: Secondary | ICD-10-CM | POA: Diagnosis not present

## 2018-11-27 DIAGNOSIS — R279 Unspecified lack of coordination: Secondary | ICD-10-CM | POA: Diagnosis not present

## 2018-11-27 DIAGNOSIS — M6281 Muscle weakness (generalized): Secondary | ICD-10-CM | POA: Diagnosis not present

## 2018-11-28 DIAGNOSIS — M6281 Muscle weakness (generalized): Secondary | ICD-10-CM | POA: Diagnosis not present

## 2018-11-28 DIAGNOSIS — M810 Age-related osteoporosis without current pathological fracture: Secondary | ICD-10-CM | POA: Diagnosis not present

## 2018-11-28 DIAGNOSIS — R279 Unspecified lack of coordination: Secondary | ICD-10-CM | POA: Diagnosis not present

## 2018-11-28 DIAGNOSIS — Z9181 History of falling: Secondary | ICD-10-CM | POA: Diagnosis not present

## 2018-11-28 DIAGNOSIS — F0390 Unspecified dementia without behavioral disturbance: Secondary | ICD-10-CM | POA: Diagnosis not present

## 2018-11-28 DIAGNOSIS — J449 Chronic obstructive pulmonary disease, unspecified: Secondary | ICD-10-CM | POA: Diagnosis not present

## 2018-11-29 DIAGNOSIS — J449 Chronic obstructive pulmonary disease, unspecified: Secondary | ICD-10-CM | POA: Diagnosis not present

## 2018-11-29 DIAGNOSIS — M6281 Muscle weakness (generalized): Secondary | ICD-10-CM | POA: Diagnosis not present

## 2018-11-29 DIAGNOSIS — M810 Age-related osteoporosis without current pathological fracture: Secondary | ICD-10-CM | POA: Diagnosis not present

## 2018-11-29 DIAGNOSIS — F0390 Unspecified dementia without behavioral disturbance: Secondary | ICD-10-CM | POA: Diagnosis not present

## 2018-11-29 DIAGNOSIS — Z9181 History of falling: Secondary | ICD-10-CM | POA: Diagnosis not present

## 2018-11-29 DIAGNOSIS — R279 Unspecified lack of coordination: Secondary | ICD-10-CM | POA: Diagnosis not present

## 2018-11-30 DIAGNOSIS — M6281 Muscle weakness (generalized): Secondary | ICD-10-CM | POA: Diagnosis not present

## 2018-11-30 DIAGNOSIS — F0390 Unspecified dementia without behavioral disturbance: Secondary | ICD-10-CM | POA: Diagnosis not present

## 2018-11-30 DIAGNOSIS — Z9181 History of falling: Secondary | ICD-10-CM | POA: Diagnosis not present

## 2018-11-30 DIAGNOSIS — M810 Age-related osteoporosis without current pathological fracture: Secondary | ICD-10-CM | POA: Diagnosis not present

## 2018-11-30 DIAGNOSIS — J449 Chronic obstructive pulmonary disease, unspecified: Secondary | ICD-10-CM | POA: Diagnosis not present

## 2018-11-30 DIAGNOSIS — R279 Unspecified lack of coordination: Secondary | ICD-10-CM | POA: Diagnosis not present

## 2018-12-03 DIAGNOSIS — R279 Unspecified lack of coordination: Secondary | ICD-10-CM | POA: Diagnosis not present

## 2018-12-03 DIAGNOSIS — J449 Chronic obstructive pulmonary disease, unspecified: Secondary | ICD-10-CM | POA: Diagnosis not present

## 2018-12-03 DIAGNOSIS — F0390 Unspecified dementia without behavioral disturbance: Secondary | ICD-10-CM | POA: Diagnosis not present

## 2018-12-03 DIAGNOSIS — R633 Feeding difficulties: Secondary | ICD-10-CM | POA: Diagnosis not present

## 2018-12-03 DIAGNOSIS — M810 Age-related osteoporosis without current pathological fracture: Secondary | ICD-10-CM | POA: Diagnosis not present

## 2018-12-03 DIAGNOSIS — Z9181 History of falling: Secondary | ICD-10-CM | POA: Diagnosis not present

## 2018-12-03 DIAGNOSIS — M6281 Muscle weakness (generalized): Secondary | ICD-10-CM | POA: Diagnosis not present

## 2018-12-04 DIAGNOSIS — J449 Chronic obstructive pulmonary disease, unspecified: Secondary | ICD-10-CM | POA: Diagnosis not present

## 2018-12-04 DIAGNOSIS — R279 Unspecified lack of coordination: Secondary | ICD-10-CM | POA: Diagnosis not present

## 2018-12-04 DIAGNOSIS — F0390 Unspecified dementia without behavioral disturbance: Secondary | ICD-10-CM | POA: Diagnosis not present

## 2018-12-04 DIAGNOSIS — Z20828 Contact with and (suspected) exposure to other viral communicable diseases: Secondary | ICD-10-CM | POA: Diagnosis not present

## 2018-12-04 DIAGNOSIS — M810 Age-related osteoporosis without current pathological fracture: Secondary | ICD-10-CM | POA: Diagnosis not present

## 2018-12-04 DIAGNOSIS — Z9181 History of falling: Secondary | ICD-10-CM | POA: Diagnosis not present

## 2018-12-04 DIAGNOSIS — M6281 Muscle weakness (generalized): Secondary | ICD-10-CM | POA: Diagnosis not present

## 2018-12-05 DIAGNOSIS — M6281 Muscle weakness (generalized): Secondary | ICD-10-CM | POA: Diagnosis not present

## 2018-12-05 DIAGNOSIS — F0390 Unspecified dementia without behavioral disturbance: Secondary | ICD-10-CM | POA: Diagnosis not present

## 2018-12-05 DIAGNOSIS — J449 Chronic obstructive pulmonary disease, unspecified: Secondary | ICD-10-CM | POA: Diagnosis not present

## 2018-12-05 DIAGNOSIS — R279 Unspecified lack of coordination: Secondary | ICD-10-CM | POA: Diagnosis not present

## 2018-12-05 DIAGNOSIS — Z9181 History of falling: Secondary | ICD-10-CM | POA: Diagnosis not present

## 2018-12-05 DIAGNOSIS — M810 Age-related osteoporosis without current pathological fracture: Secondary | ICD-10-CM | POA: Diagnosis not present

## 2018-12-06 DIAGNOSIS — Z9181 History of falling: Secondary | ICD-10-CM | POA: Diagnosis not present

## 2018-12-06 DIAGNOSIS — F0390 Unspecified dementia without behavioral disturbance: Secondary | ICD-10-CM | POA: Diagnosis not present

## 2018-12-06 DIAGNOSIS — R279 Unspecified lack of coordination: Secondary | ICD-10-CM | POA: Diagnosis not present

## 2018-12-06 DIAGNOSIS — M6281 Muscle weakness (generalized): Secondary | ICD-10-CM | POA: Diagnosis not present

## 2018-12-06 DIAGNOSIS — J449 Chronic obstructive pulmonary disease, unspecified: Secondary | ICD-10-CM | POA: Diagnosis not present

## 2018-12-06 DIAGNOSIS — M810 Age-related osteoporosis without current pathological fracture: Secondary | ICD-10-CM | POA: Diagnosis not present

## 2018-12-07 DIAGNOSIS — M6281 Muscle weakness (generalized): Secondary | ICD-10-CM | POA: Diagnosis not present

## 2018-12-07 DIAGNOSIS — J449 Chronic obstructive pulmonary disease, unspecified: Secondary | ICD-10-CM | POA: Diagnosis not present

## 2018-12-07 DIAGNOSIS — M810 Age-related osteoporosis without current pathological fracture: Secondary | ICD-10-CM | POA: Diagnosis not present

## 2018-12-07 DIAGNOSIS — F0390 Unspecified dementia without behavioral disturbance: Secondary | ICD-10-CM | POA: Diagnosis not present

## 2018-12-07 DIAGNOSIS — Z9181 History of falling: Secondary | ICD-10-CM | POA: Diagnosis not present

## 2018-12-07 DIAGNOSIS — R279 Unspecified lack of coordination: Secondary | ICD-10-CM | POA: Diagnosis not present

## 2018-12-14 ENCOUNTER — Non-Acute Institutional Stay (SKILLED_NURSING_FACILITY): Payer: Medicare Other | Admitting: Adult Health

## 2018-12-14 ENCOUNTER — Encounter: Payer: Self-pay | Admitting: Adult Health

## 2018-12-14 DIAGNOSIS — E1122 Type 2 diabetes mellitus with diabetic chronic kidney disease: Secondary | ICD-10-CM | POA: Diagnosis not present

## 2018-12-14 DIAGNOSIS — I482 Chronic atrial fibrillation, unspecified: Secondary | ICD-10-CM

## 2018-12-14 DIAGNOSIS — E1169 Type 2 diabetes mellitus with other specified complication: Secondary | ICD-10-CM | POA: Diagnosis not present

## 2018-12-14 DIAGNOSIS — F0391 Unspecified dementia with behavioral disturbance: Secondary | ICD-10-CM

## 2018-12-14 DIAGNOSIS — F339 Major depressive disorder, recurrent, unspecified: Secondary | ICD-10-CM | POA: Diagnosis not present

## 2018-12-14 DIAGNOSIS — E1165 Type 2 diabetes mellitus with hyperglycemia: Secondary | ICD-10-CM | POA: Diagnosis not present

## 2018-12-14 DIAGNOSIS — E785 Hyperlipidemia, unspecified: Secondary | ICD-10-CM | POA: Diagnosis not present

## 2018-12-14 DIAGNOSIS — M810 Age-related osteoporosis without current pathological fracture: Secondary | ICD-10-CM | POA: Diagnosis not present

## 2018-12-14 DIAGNOSIS — E1151 Type 2 diabetes mellitus with diabetic peripheral angiopathy without gangrene: Secondary | ICD-10-CM

## 2018-12-14 DIAGNOSIS — E876 Hypokalemia: Secondary | ICD-10-CM | POA: Diagnosis not present

## 2018-12-14 DIAGNOSIS — I129 Hypertensive chronic kidney disease with stage 1 through stage 4 chronic kidney disease, or unspecified chronic kidney disease: Secondary | ICD-10-CM

## 2018-12-14 DIAGNOSIS — IMO0002 Reserved for concepts with insufficient information to code with codable children: Secondary | ICD-10-CM

## 2018-12-14 DIAGNOSIS — N182 Chronic kidney disease, stage 2 (mild): Secondary | ICD-10-CM

## 2018-12-14 DIAGNOSIS — K219 Gastro-esophageal reflux disease without esophagitis: Secondary | ICD-10-CM | POA: Diagnosis not present

## 2018-12-14 NOTE — Progress Notes (Signed)
Location:    [facility] Room Number: 113/W Place of Service:  SNF (31)   CODE STATUS: DNR  Allergies  Allergen Reactions  . Codeine Anaphylaxis and Hives  . Morphine And Related Anaphylaxis and Hives  . Penicillins Anaphylaxis  . Ace Inhibitors Cough    Chief Complaint  Patient presents with  . Annual Exam        HPI:  She is a 78 year old long term resident of this facility being seen for her annual comprehensive exam. She has not had any hospitalizations. She has had a colonoscopy related to rectal bleeding this past year she has had 3 falls without injuries. She has been slowly losing weight over the past months: her weight in Dec 2019 was 196 pounds with her current weight at 183 pounds. Weight loss is an unfortunate outcome in the later stages of this disease process. There are no reports of uncontrolled pain; no reports of anxiety or agitation. No reports of further rectal bleeding. She continues to be followed for her chronic illnesses including: osteoporosis; afib; edema.   Past Medical History:  Diagnosis Date  . Allergy   . Atrial fibrillation ([place]) 08/2011   First diagnosed in 08/2011; duration of arrhythmia is uncertain  . Bilateral lower extremity edema   . Chronic diarrhea    diverticulosis  . COPD (chronic obstructive pulmonary disease) ([place])   . Dementia ([place])   . Diabetes mellitus, type 2 (HCC)    Diabetic neuropathy  . Dyspnea on exertion    pedal edema  . Gout   . Headache(784.0)    twice weekly  . Hyperlipidemia   . Hypertension   . Osteopenia    DEXA scan 01/2010  . Palpitations   . Seasonal allergies   . Stress incontinence   . Vertigo     Past Surgical History:  Procedure Laterality Date  . APPENDECTOMY    . BREAST BIOPSY  2002   Right  . CESAREAN SECTION     x 2  . CHOLECYSTECTOMY    . COLONOSCOPY  2007   Negative screening study  . COLONOSCOPY WITH PROPOFOL N/A 09/18/2017   Procedure: COLONOSCOPY WITH  PROPOFOL;  Surgeon: Daneil Dolin, MD;  [facility];  Service: Endoscopy;  Laterality: N/A;  11:00am  . HAMMER TOE SURGERY     Bilateral hammer toe amputation  . INCISIONAL HERNIA REPAIR    . KNEE ARTHROSCOPY W/ MENISCAL REPAIR  2007   Bilateral  . POLYPECTOMY  09/18/2017   Procedure: POLYPECTOMY;  Surgeon: Daneil Dolin, MD;  [facility];  Service: Endoscopy;;  colon  . UMBILICAL HERNIA REPAIR      Social History   Socioeconomic History  . Marital status: Widowed    Spouse name: Not on file  . Number of children: 2  . Years of education: Not on file  . Highest education level: Not on file  Occupational History  . Occupation: Pension scheme manager  . Financial resource strain: Not hard at all  . Food insecurity    Worry: Never true    Inability: Never true  . Transportation needs    Medical: No    Non-medical: No  Tobacco Use  . Smoking status: Never Smoker  . Smokeless tobacco: Never Used  Substance and Sexual Activity  . Alcohol use: No  . Drug use: No  . Sexual activity: Not on file  Lifestyle  . Physical activity  Days per week: 0 days    Minutes per session: 0 min  . Stress: Not at all  Relationships  . Social Herbalist on phone: Never    Gets together: Never    Attends religious service: Never    Active member of club or organization: No    Attends meetings of clubs or organizations: Never    Relationship status: Widowed  . Intimate partner violence    Fear of current or ex partner: No    Emotionally abused: No    Physically abused: No    Forced sexual activity: No  Other Topics Concern  . Not on file  Social History Narrative  . Not on file   Family History  Adopted: Yes      VITAL SIGNS BP 99/63   Pulse 71   Temp 98.2 F (36.8 C) (Oral)   Resp 18   Ht 5\' 6"  (1.676 m)   Wt 183 lb 12.8 oz (83.4 kg)   SpO2 98%   BMI 29.67 kg/m   Outpatient Encounter Medications as of 12/14/2018   Medication Sig  . acetaminophen (TYLENOL) 325 MG tablet Take 650 mg by mouth every 6 (six) hours as needed.   Marland Kitchen alendronate (FOSAMAX) 70 MG tablet Give 1 tablet by mouth on Sunday. Take with a full glass of water on an empty stomach.  Marland Kitchen apixaban (ELIQUIS) 5 MG TABS tablet Take 5 mg by mouth 2 (two) times daily.   . carboxymethylcellulose (REFRESH PLUS) 0.5 % SOLN Place 1 drop into both eyes 4 (four) times daily.  . cholecalciferol (VITAMIN D) 1000 UNITS tablet Take 1,000 Units by mouth daily.  . citalopram (CELEXA) 10 MG tablet Take 10 mg by mouth daily.  Marland Kitchen Dextromethorphan-guaiFENesin (ROBITUSSIN DM PO) Take 15 mLs by mouth every 6 (six) hours as needed. May give up to 48 hours.  Inform physician immediately if cough or congestion is associated with fever or SOB  . digoxin (LANOXIN) 0.125 MG tablet Take 1 tablet by mouth once a day (HOLD FOR AP UNDER 60)  . furosemide (LASIX) 40 MG tablet Take 40 mg by mouth daily.   . insulin aspart (NOVOLOG) 100 UNIT/ML injection Inject 12 Units into the skin once. Only With Lunch if BS are more then 150   . Insulin Glargine (BASAGLAR KWIKPEN) 100 UNIT/ML SOPN Inject 46 Units into the skin at bedtime.  . memantine (NAMENDA) 10 MG tablet Take 10 mg by mouth 2 (two) times daily.  . metolazone (ZAROXOLYN) 2.5 MG tablet Take 5 mg by mouth daily. Mon. Fri   . metoprolol tartrate (LOPRESSOR) 25 MG tablet Take 50 mg by mouth 2 (two) times daily.   . Multiple Vitamin (MULTIVITAMIN WITH MINERALS) TABS Take 1 tablet by mouth every morning.  . NON FORMULARY Diet Type:  NAS, consistent CHO, Dysphagia 3 with thin liquids  Give Cranberry Juice two times daily at 9 am and 5 pm  . omeprazole (PRILOSEC) 40 MG capsule Take 40 mg by mouth daily.  . polyethylene glycol (MIRALAX / GLYCOLAX) packet Take 17 g by mouth daily as needed.   . potassium chloride (MICRO-K) 10 MEQ CR capsule Give 4 tablets (40 mg) by mouth two times daily  . pravastatin (PRAVACHOL) 20 MG tablet Take 20  mg by mouth daily. Take along with 40 mg to = 60 mg  . pravastatin (PRAVACHOL) 40 MG tablet Take 40 mg by mouth daily. Take along with 20 mg to = 60 mg  .  sitaGLIPtin-metformin (JANUMET) 50-500 MG tablet Take 1 tablet by mouth 2 (two) times daily with a meal.   No facility-administered encounter medications on file as of 12/14/2018.      SIGNIFICANT DIAGNOSTIC EXAMS   LABS REVIEWED PREVIOUS:   12-18-17: chol 149; ldl 87; trig 157; hdl 31  01-12-18: dig 0.9 04-26-18: wbc 7.9; hgb 11.7; hct 38.0; mcv 90.9; plt 241; glucose 147; bun 24; creat 1.03 ;k+ 3.6; na++ 138; ca 9.3  05-23-18: hgb a1c 8.0  08-09-18: wbc 10.3 hgb 12.1; hct 39.2 mcv 92.2 ;plt 242; glucose 86 bun 31; creat 1.08; k+ 3.2; na++ 141; ca 9.3; chol 148; ldl 95; trig 107; hdl 32 hgb a1c 7.9 08-23-18: glucose 174; bun 30; creat 0.99; k+ 3.6; na++ 142; ca 9.1  10-09-18: wbc 8.3; hgb 11.9; hct 38.2; mcv 91.6; plt 232; gluocse 203; bun 29; creat 1.06; k+ 3.5;na++ 137; ca 8.2; tsh 2.608; dig 1.0    NO NEW LABS.    Review of Systems  Unable to perform ROS: Dementia (unable to participate )   Physical Exam Constitutional:      General: She is not in acute distress.    Appearance: She is well-developed. She is not diaphoretic.  HENT:     Right Ear: External ear normal.     Left Ear: External ear normal.     Nose: Nose normal.     Mouth/Throat:     Mouth: Mucous membranes are moist.     Pharynx: Oropharynx is clear.  Neck:     Musculoskeletal: Neck supple.     Thyroid: No thyromegaly.  Cardiovascular:     Rate and Rhythm: Normal rate. Rhythm irregular.     Pulses: Normal pulses.     Heart sounds: Normal heart sounds.  Pulmonary:     Effort: Pulmonary effort is normal. No respiratory distress.     Breath sounds: Normal breath sounds.  Abdominal:     General: Bowel sounds are normal. There is no distension.     Palpations: Abdomen is soft.     Tenderness: There is no abdominal tenderness.  Musculoskeletal:     Right lower  leg: No edema.     Left lower leg: No edema.     Comments: Is able to move all extremities   Lymphadenopathy:     Cervical: No cervical adenopathy.  Skin:    General: Skin is warm and dry.  Neurological:     Mental Status: She is alert. Mental status is at baseline.  Psychiatric:        Mood and Affect: Mood normal.      ASSESSMENT/ PLAN:  TODAY:   1. 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 Major depression recurrent chronic: is emotionally stable is tolerating celexa 10 mg daily without difficulty  3. Type 2 diabetes uncontrolled with peripheral circulatory disorder: hgb a1c 7.9; cbgs are all elevated about 200. Will continue junamet 50/500 mg twice daily will change to basaglar 50 units nightly and novolog 10 units with meals for cbg >150  4. Dyslipidemia associated with type 2 diabetes mellitus: is stable LDL 95 will continue pravachol 60 mg daily   5. CKD stage 2 due to type 2 diabetes mellitus: is tsable bun 29 creat 1.06  6. Hypertension associated with stage 2 chronic kidney disease due to type 2 diabetes mellitus: is stable b/p 116/57 will continue lopressor 75 mg twice daily  7. Chronic atrial fibrillation: heart rate is table will continue digoxin 0.125 mg daily and lopressor 75 mg twice daily for rate control is on eliquis 5 mg twice daily   8. Bilateral lower extremity edema: is stable will continue lasix 40 mg daily and zaroxolyn 5 mg twice weekly   9. Hypokalemia is stable k+ 2.5 will continue k+ 40 meq twice daily   10. GERD without esophagitis: is stable will continue prilosec 40 mg daily  11. Unspecified dementia with behavioral disturbance: is without change is slowly losing weight: weight is 183 (previous 191,192,194,195) pounds; will continue namenda 10 mg twice daily will monitor .  Weight loss is an unfortunate outcome in later stages of this disease  process.   Will setup DEXA scan Will check hgb a1c      MD is aware of resident's narcotic use and is in agreement with current plan of care. We will attempt to wean resident as apropriate   Ok Edwards NP [facility] Adult Medicine  Contact [phone] Monday through Friday 8am- 5pm  After hours call [phone]

## 2018-12-18 ENCOUNTER — Other Ambulatory Visit (HOSPITAL_COMMUNITY)
Admission: RE | Admit: 2018-12-18 | Discharge: 2018-12-18 | Disposition: A | Discharge status: Home / Self Care | Payer: Medicare Other | Source: Skilled Nursing Facility | Attending: Adult Health | Admitting: Adult Health

## 2018-12-18 DIAGNOSIS — E1140 Type 2 diabetes mellitus with diabetic neuropathy, unspecified: Secondary | ICD-10-CM | POA: Diagnosis not present

## 2018-12-18 LAB — HEMOGLOBIN A1C
Hgb A1c MFr Bld: 7.8 % — ABNORMAL HIGH (ref 4.8–5.6)
Mean Plasma Glucose: 177.16 mg/dL

## 2018-12-21 DIAGNOSIS — G301 Alzheimer's disease with late onset: Secondary | ICD-10-CM | POA: Diagnosis not present

## 2018-12-21 DIAGNOSIS — F331 Major depressive disorder, recurrent, moderate: Secondary | ICD-10-CM | POA: Diagnosis not present

## 2019-01-18 ENCOUNTER — Encounter: Payer: Self-pay | Admitting: Adult Health

## 2019-01-18 ENCOUNTER — Non-Acute Institutional Stay (SKILLED_NURSING_FACILITY): Payer: Medicare Other | Admitting: Adult Health

## 2019-01-18 DIAGNOSIS — F339 Major depressive disorder, recurrent, unspecified: Secondary | ICD-10-CM | POA: Diagnosis not present

## 2019-01-18 DIAGNOSIS — M810 Age-related osteoporosis without current pathological fracture: Secondary | ICD-10-CM

## 2019-01-18 DIAGNOSIS — E1151 Type 2 diabetes mellitus with diabetic peripheral angiopathy without gangrene: Secondary | ICD-10-CM | POA: Diagnosis not present

## 2019-01-18 DIAGNOSIS — E1165 Type 2 diabetes mellitus with hyperglycemia: Secondary | ICD-10-CM

## 2019-01-18 DIAGNOSIS — IMO0002 Reserved for concepts with insufficient information to code with codable children: Secondary | ICD-10-CM

## 2019-01-18 NOTE — Progress Notes (Signed)
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  There are no reports of uncontrolled pain;no reports of agitation or anxiety; no reports of changes in appetite.   Past Medical History:  Diagnosis Date   Allergy    Atrial fibrillation ([place]) 08/2011   First diagnosed in 08/2011; duration of arrhythmia is uncertain   Bilateral lower extremity edema    Chronic diarrhea    diverticulosis   COPD (chronic obstructive pulmonary disease) (HCC)    Dementia (HCC)    Diabetes mellitus, type 2 ([place])    Diabetic neuropathy   Dyspnea on exertion    pedal edema   Gout    Headache(784.0)    twice weekly   Hyperlipidemia    Hypertension    Osteopenia    DEXA scan 01/2010   Palpitations    Seasonal allergies    Stress incontinence    Vertigo     Past Surgical History:  Procedure Laterality Date   APPENDECTOMY     BREAST BIOPSY  2002   Right   CESAREAN SECTION     x 2   CHOLECYSTECTOMY     COLONOSCOPY  2007   Negative screening study   COLONOSCOPY WITH PROPOFOL N/A 09/18/2017   Procedure: COLONOSCOPY WITH PROPOFOL;  Surgeon: Daneil Dolin, MD;  [facility];  Service: Endoscopy;  Laterality: N/A;  11:00am   HAMMER TOE SURGERY     Bilateral hammer toe amputation   INCISIONAL HERNIA REPAIR     KNEE  ARTHROSCOPY W/ MENISCAL REPAIR  2007   Bilateral   POLYPECTOMY  09/18/2017   Procedure: POLYPECTOMY;  Surgeon: Daneil Dolin, MD;  [facility];  Service: Endoscopy;;  colon   UMBILICAL HERNIA REPAIR      Social History   Socioeconomic History   Marital status: Widowed    Spouse name: Not on file   Number of children: 2   Years of education: Not on file   Highest education level: Not on file  Occupational History   Occupation: Retail banker strain: Not hard at all   Food insecurity    Worry: Never true    Inability: Never true   Transportation needs    Medical: No    Non-medical: No  Tobacco Use   Smoking status: Never Smoker   Smokeless tobacco: Never Used  Substance and Sexual Activity   Alcohol use: No   Drug use: No   Sexual activity: Not on file  Lifestyle   Physical activity    Days per week: 0 days    Minutes per session: 0 min   Stress: Not at all  Relationships   Social connections    Talks on phone: Never    Gets together: Never    Attends religious service: Never  Active member of club or organization: No    Attends meetings of clubs or organizations: Never    Relationship status: Widowed   Intimate partner violence    Fear of current or ex partner: No    Emotionally abused: No    Physically abused: No    Forced sexual activity: No  Other Topics Concern   Not on file  Social History Narrative   Not on file   Family History  Adopted: Yes      VITAL SIGNS BP 110/64    Pulse 63    Temp 98.4 F (36.9 C) (Oral)    Resp 16    Ht 5\' 6"  (1.676 m)    Wt 184 lb 6.4 oz (83.6 kg)    SpO2 98%    BMI 29.76 kg/m   Outpatient Encounter Medications as of 01/18/2019  Medication Sig   acetaminophen (TYLENOL) 325 MG tablet Take 650 mg by mouth every 6 (six) hours as needed.    alendronate (FOSAMAX) 70 MG tablet Give 1 tablet by mouth on Sunday. Take with a full glass of water on an  empty stomach.   apixaban (ELIQUIS) 5 MG TABS tablet Take 5 mg by mouth 2 (two) times daily.    carboxymethylcellulose (REFRESH PLUS) 0.5 % SOLN Place 1 drop into both eyes 4 (four) times daily.   cholecalciferol (VITAMIN D) 1000 UNITS tablet Take 1,000 Units by mouth daily.   citalopram (CELEXA) 10 MG tablet Take 10 mg by mouth daily.   Dextromethorphan-guaiFENesin (ROBITUSSIN DM PO) Take 15 mLs by mouth every 6 (six) hours as needed. May give up to 48 hours.  Inform physician immediately if cough or congestion is associated with fever or SOB   digoxin (LANOXIN) 0.125 MG tablet Take 1 tablet by mouth once a day (HOLD FOR AP UNDER 60)   furosemide (LASIX) 40 MG tablet Take 40 mg by mouth daily.    insulin aspart (NOVOLOG) 100 UNIT/ML injection Inject 10 Units into the skin once. Only With Lunch if BS are more then 150    Insulin Glargine (BASAGLAR KWIKPEN) 100 UNIT/ML SOPN Inject 50 Units into the skin at bedtime.    memantine (NAMENDA) 10 MG tablet Take 10 mg by mouth 2 (two) times daily.   metolazone (ZAROXOLYN) 2.5 MG tablet Take 5 mg by mouth daily. Mon. Fri    metoprolol tartrate (LOPRESSOR) 25 MG tablet Take 50 mg by mouth 2 (two) times daily.    Multiple Vitamin (MULTIVITAMIN WITH MINERALS) TABS Take 1 tablet by mouth every morning.   NON FORMULARY Diet Type:  NAS, consistent CHO, Dysphagia 3 with thin liquids  Give Cranberry Juice two times daily at 9 am and 5 pm   omeprazole (PRILOSEC) 40 MG capsule Take 40 mg by mouth daily.   polyethylene glycol (MIRALAX / GLYCOLAX) packet Take 17 g by mouth daily as needed.    potassium chloride (MICRO-K) 10 MEQ CR capsule Give 4 tablets (40 mg) by mouth two times daily   pravastatin (PRAVACHOL) 20 MG tablet Take 20 mg by mouth daily. Take along with 40 mg to = 60 mg   pravastatin (PRAVACHOL) 40 MG tablet Take 40 mg by mouth daily. Take along with 20 mg to = 60 mg   sitaGLIPtin-metformin (JANUMET) 50-500 MG tablet Take 1 tablet  by mouth 2 (two) times daily with a meal.   No facility-administered encounter medications on file as of 01/18/2019.      SIGNIFICANT DIAGNOSTIC EXAMS   LABS  REVIEWED PREVIOUS:   01-12-18: dig 0.9 04-26-18: wbc 7.9; hgb 11.7; hct 38.0; mcv 90.9; plt 241; glucose 147; bun 24; creat 1.03 ;k+ 3.6; na++ 138; ca 9.3  05-23-18: hgb a1c 8.0  08-09-18: wbc 10.3 hgb 12.1; hct 39.2 mcv 92.2 ;plt 242; glucose 86 bun 31; creat 1.08; k+ 3.2; na++ 141; ca 9.3; chol 148; ldl 95; trig 107; hdl 32 hgb a1c 7.9 08-23-18: glucose 174; bun 30; creat 0.99; k+ 3.6; na++ 142; ca 9.1  10-09-18: wbc 8.3; hgb 11.9; hct 38.2; mcv 91.6; plt 232; gluocse 203; bun 29; creat 1.06; k+ 3.5;na++ 137; ca 8.2; tsh 2.608; dig 1.0    TODAY;   11-14-18: dig 0.9 12-18-18: hgb a1c 7.8     Review of Systems  Unable to perform ROS: Dementia (unable to participate )    Physical Exam Constitutional:      General: She is not in acute distress.    Appearance: She is well-developed. She is not diaphoretic.  Neck:     Musculoskeletal: Neck supple.     Thyroid: No thyromegaly.  Cardiovascular:     Rate and Rhythm: Normal rate. Rhythm irregular.     Pulses: Normal pulses.     Heart sounds: Normal heart sounds.  Pulmonary:     Effort: Pulmonary effort is normal. No respiratory distress.     Breath sounds: Normal breath sounds.  Abdominal:     General: Bowel sounds are normal. There is no distension.     Palpations: Abdomen is soft.     Tenderness: There is no abdominal tenderness.  Musculoskeletal:     Right lower leg: No edema.     Left lower leg: No edema.     Comments: Is able to participate   Lymphadenopathy:     Cervical: No cervical adenopathy.  Skin:    General: Skin is warm and dry.  Neurological:     Mental Status: She is alert. Mental status is at baseline.  Psychiatric:        Mood and Affect: Mood normal.       ASSESSMENT/ PLAN:  TODAY:   1. Age related osteoporosis without current pathological  fracture: is stable T score -4.4 will continue fosamax 70 mg weekly with supplements  2. Major depression recurrent chronic: is emotionally stable will continue celexa 10 mg daily   3. 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    PREVIOUS   4. Dyslipidemia associated with type 2 diabetes mellitus: is stable LDL 95 will continue pravachol 60 mg daily   5. CKD stage 2 due to type 2 diabetes mellitus: is tsable bun 29 creat 1.06  6. Hypertension associated with stage 2 chronic kidney disease due to type 2 diabetes mellitus: is stable b/p 116/57 will continue lopressor 75 mg twice daily   7. Chronic atrial fibrillation: heart rate is table will continue digoxin 0.125 mg daily and lopressor 75 mg twice daily for rate control is on eliquis 5 mg twice daily   8. Bilateral lower extremity edema: is stable will continue lasix 40 mg daily and zaroxolyn 5 mg twice weekly   9. Hypokalemia is stable k+ 2.5 will continue k+ 40 meq twice daily   10. GERD without esophagitis: is stable will continue prilosec 40 mg daily  11. Unspecified dementia with behavioral disturbance: is without change is slowly losing weight: weight is 184  pounds;  will continue namenda 10 mg twice daily will monitor .  Weight loss is an unfortunate outcome in later stages of this disease process.    Will setup dexa and screening mammogram  Urine for micro-albumin    MD is aware of resident's narcotic use and is in agreement with current plan of care. We will attempt to wean resident as appropriate.  Ok Edwards NP [facility]  Contact [phone] Monday through Friday 8am- 5pm  After hours call [phone]

## 2019-01-24 ENCOUNTER — Other Ambulatory Visit (HOSPITAL_COMMUNITY)
Admission: RE | Admit: 2019-01-24 | Discharge: 2019-01-24 | Disposition: A | Discharge status: Home / Self Care | Payer: Medicare Other | Source: Skilled Nursing Facility | Attending: Adult Health | Admitting: Adult Health

## 2019-01-24 DIAGNOSIS — E1140 Type 2 diabetes mellitus with diabetic neuropathy, unspecified: Secondary | ICD-10-CM | POA: Diagnosis not present

## 2019-01-29 ENCOUNTER — Encounter: Payer: Self-pay | Admitting: Adult Health

## 2019-01-29 ENCOUNTER — Non-Acute Institutional Stay (SKILLED_NURSING_FACILITY): Payer: Medicare Other | Admitting: Adult Health

## 2019-01-29 DIAGNOSIS — E1151 Type 2 diabetes mellitus with diabetic peripheral angiopathy without gangrene: Secondary | ICD-10-CM

## 2019-01-29 DIAGNOSIS — E1165 Type 2 diabetes mellitus with hyperglycemia: Secondary | ICD-10-CM | POA: Diagnosis not present

## 2019-01-29 DIAGNOSIS — IMO0002 Reserved for concepts with insufficient information to code with codable children: Secondary | ICD-10-CM

## 2019-01-29 NOTE — Progress Notes (Signed)
Location:    [place] Room Number: 113/W Place of Service:  SNF (31)   CODE STATUS: DNR  Allergies  Allergen Reactions  . Codeine Anaphylaxis and Hives  . Morphine And Related Anaphylaxis and Hives  . Penicillins Anaphylaxis  . Ace Inhibitors Cough    Chief Complaint  Patient presents with  . Acute Visit    Diabetes Management, Blood Sugar 68 mg/dl    HPI:  She is on long term insulin to manage her diabetes. Her cbg readings in the AM are good; throughout the day they become more elevated. There are no reports of excessive appetite or thirst. There are no reports of uncontrolled pain.   Past Medical History:  Diagnosis Date  . Allergy   . Atrial fibrillation ([place]) 08/2011   First diagnosed in 08/2011; duration of arrhythmia is uncertain  . Bilateral lower extremity edema   . Chronic diarrhea    diverticulosis  . COPD (chronic obstructive pulmonary disease) ([place])   . Dementia ([place])   . Diabetes mellitus, type 2 (H[facility])    Diabetic neuropathy  . Dyspnea on exertion    pedal edema  . Gout   . Headache(784.0)    twice weekly  . Hyperlipidemia   . Hypertension   . Osteopenia    DEXA scan 01/2010  . Palpitations   . Seasonal allergies   . Stress incontinence   . Vertigo     Past Surgical History:  Procedure Laterality Date  . APPENDECTOMY    . BREAST BIOPSY  2002   Right  . CESAREAN SECTION     x 2  . CHOLECYSTECTOMY    . COLONOSCOPY  2007   Negative screening study  . COLONOSCOPY WITH PROPOFOL N/A 09/18/2017   Procedure: COLONOSCOPY WITH PROPOFOL;  Surgeon: Daneil Dolin, MD;  [facility];  Service: Endoscopy;  Laterality: N/A;  11:00am  . HAMMER TOE SURGERY     Bilateral hammer toe amputation  . INCISIONAL HERNIA REPAIR    . KNEE ARTHROSCOPY W/ MENISCAL REPAIR  2007   Bilateral  . POLYPECTOMY  09/18/2017   Procedure: POLYPECTOMY;  Surgeon: Daneil Dolin, MD;  [facility];  Service: Endoscopy;;  colon  .  UMBILICAL HERNIA REPAIR      Social History   Socioeconomic History  . Marital status: Widowed    Spouse name: Not on file  . Number of children: 2  . Years of education: Not on file  . Highest education level: Not on file  Occupational History  . Occupation: Pension scheme manager  . Financial resource strain: Not hard at all  . Food insecurity    Worry: Never true    Inability: Never true  . Transportation needs    Medical: No    Non-medical: No  Tobacco Use  . Smoking status: Never Smoker  . Smokeless tobacco: Never Used  Substance and Sexual Activity  . Alcohol use: No  . Drug use: No  . Sexual activity: Not on file  Lifestyle  . Physical activity    Days per week: 0 days    Minutes per session: 0 min  . Stress: Not at all  Relationships  . Social Herbalist on phone: Never    Gets together: Never    Attends religious service: Never    Active member of club or organization: No    Attends meetings of clubs or organizations: Never  Relationship status: Widowed  . Intimate partner violence    Fear of current or ex partner: No    Emotionally abused: No    Physically abused: No    Forced sexual activity: No  Other Topics Concern  . Not on file  Social History Narrative  . Not on file   Family History  Adopted: Yes      VITAL SIGNS BP 105/70   Pulse 68   Temp 98.6 F (37 C) (Oral)   Resp 16   Ht 5\' 6"  (1.676 m)   Wt 184 lb 6.4 oz (83.6 kg)   SpO2 98%   BMI 29.76 kg/m   Outpatient Encounter Medications as of 01/29/2019  Medication Sig  . acetaminophen (TYLENOL) 325 MG tablet Take 650 mg by mouth every 6 (six) hours as needed.   Marland Kitchen alendronate (FOSAMAX) 70 MG tablet Give 1 tablet by mouth on Sunday. Take with a full glass of water on an empty stomach.  Marland Kitchen apixaban (ELIQUIS) 5 MG TABS tablet Take 5 mg by mouth 2 (two) times daily.   . carboxymethylcellulose (REFRESH PLUS) 0.5 % SOLN Place 1 drop into both eyes 4 (four) times  daily.  . cholecalciferol (VITAMIN D) 1000 UNITS tablet Take 1,000 Units by mouth daily.  . citalopram (CELEXA) 10 MG tablet Take 10 mg by mouth daily.  . [employer] CRANBERRY PO 2 (two) times daily.  Marland Kitchen Dextromethorphan-guaiFENesin (ROBITUSSIN DM PO) Take 15 mLs by mouth every 6 (six) hours as needed. May give up to 48 hours.  Inform physician immediately if cough or congestion is associated with fever or SOB  . digoxin (LANOXIN) 0.125 MG tablet Take 1 tablet by mouth once a day (HOLD FOR AP UNDER 60)  . furosemide (LASIX) 40 MG tablet Take 40 mg by mouth daily.   . insulin aspart (NOVOLOG) 100 UNIT/ML injection Inject 10 Units into the skin 3 (three) times daily with meals. Special Instructions: if BS above 150  . Insulin Glargine (BASAGLAR KWIKPEN) 100 UNIT/ML SOPN Inject 50 Units into the skin at bedtime.   . memantine (NAMENDA) 10 MG tablet Take 10 mg by mouth 2 (two) times daily.  . metolazone (ZAROXOLYN) 2.5 MG tablet Take 5 mg by mouth daily. Mon. Fri   . metoprolol tartrate (LOPRESSOR) 25 MG tablet Take 50 mg by mouth 2 (two) times daily.   . Multiple Vitamin (MULTIVITAMIN WITH MINERALS) TABS Take 1 tablet by mouth every morning.  . NON FORMULARY Diet Type:  NAS, consistent CHO, Dysphagia 3 with thin liquids  Give Cranberry Juice two times daily at 9 am and 5 pm  . omeprazole (PRILOSEC) 40 MG capsule Take 40 mg by mouth daily.  . polyethylene glycol (MIRALAX / GLYCOLAX) packet Take 17 g by mouth daily as needed.   . potassium chloride (MICRO-K) 10 MEQ CR capsule Give 4 tablets (40 mg) by mouth two times daily  . pravastatin (PRAVACHOL) 20 MG tablet Take 20 mg by mouth daily. Take along with 40 mg to = 60 mg  . pravastatin (PRAVACHOL) 40 MG tablet Take 40 mg by mouth daily. Take along with 20 mg to = 60 mg  . sitaGLIPtin-metformin (JANUMET) 50-500 MG tablet Take 1 tablet by mouth 2 (two) times daily with a meal.   No facility-administered encounter medications on file as of 01/29/2019.       SIGNIFICANT DIAGNOSTIC EXAMS   LABS REVIEWED PREVIOUS:   01-12-18: dig 0.9 04-26-18: wbc 7.9; hgb 11.7; hct 38.0; mcv 90.9; plt  241; glucose 147; bun 24; creat 1.03 ;k+ 3.6; na++ 138; ca 9.3  05-23-18: hgb a1c 8.0  08-09-18: wbc 10.3 hgb 12.1; hct 39.2 mcv 92.2 ;plt 242; glucose 86 bun 31; creat 1.08; k+ 3.2; na++ 141; ca 9.3; chol 148; ldl 95; trig 107; hdl 32 hgb a1c 7.9 08-23-18: glucose 174; bun 30; creat 0.99; k+ 3.6; na++ 142; ca 9.1  10-09-18: wbc 8.3; hgb 11.9; hct 38.2; mcv 91.6; plt 232; gluocse 203; bun 29; creat 1.06; k+ 3.5;na++ 137; ca 8.2; tsh 2.608; dig 1.0   11-14-18: dig 0.9 12-18-18: hgb a1c 7.8    NO NEW LABS   Review of Systems  Unable to perform ROS: Dementia (unable to participate )    Physical Exam Constitutional:      General: She is not in acute distress.    Appearance: She is well-developed. She is not diaphoretic.  Neck:     Musculoskeletal: Neck supple.     Thyroid: No thyromegaly.  Cardiovascular:     Rate and Rhythm: Normal rate. Rhythm irregular.     Pulses: Normal pulses.     Heart sounds: Normal heart sounds.  Pulmonary:     Effort: Pulmonary effort is normal. No respiratory distress.     Breath sounds: Normal breath sounds.  Abdominal:     General: Bowel sounds are normal. There is no distension.     Palpations: Abdomen is soft.     Tenderness: There is no abdominal tenderness.  Musculoskeletal:     Right lower leg: No edema.     Left lower leg: No edema.     Comments: Able to move all extremities   Lymphadenopathy:     Cervical: No cervical adenopathy.  Skin:    General: Skin is warm and dry.  Neurological:     Mental Status: She is alert. Mental status is at baseline.  Psychiatric:        Mood and Affect: Mood normal.     ASSESSMENT/ PLAN:  TODAY:   1. Type 2 diabetes mellitus uncontrolled with peripheral circulatory disorder: hgb a1c 7.8; cbgs are elevated: will continue junamet 50/500 mg twice daily basaglar 50 units nightly and  will change to novolog 10 units with breakfast; 13 units with lunch and supper.      MD is aware of resident's narcotic use and is in agreement with current plan of care. We will attempt to wean resident as appropriate.  Ok Edwards NP [facility]  Contact [phone] Monday through Friday 8am- 5pm  After hours call [phone]

## 2019-01-30 ENCOUNTER — Encounter: Payer: Self-pay | Admitting: Adult Health

## 2019-01-30 ENCOUNTER — Non-Acute Institutional Stay (SKILLED_NURSING_FACILITY): Payer: Medicare Other | Admitting: Adult Health

## 2019-01-30 ENCOUNTER — Other Ambulatory Visit: Payer: Self-pay | Admitting: Adult Health

## 2019-01-30 DIAGNOSIS — F0391 Unspecified dementia with behavioral disturbance: Secondary | ICD-10-CM | POA: Diagnosis not present

## 2019-01-30 DIAGNOSIS — F339 Major depressive disorder, recurrent, unspecified: Secondary | ICD-10-CM

## 2019-01-30 DIAGNOSIS — I482 Chronic atrial fibrillation, unspecified: Secondary | ICD-10-CM | POA: Diagnosis not present

## 2019-01-30 NOTE — Progress Notes (Signed)
Location:    [place] Room Number: 113/W Place of Service:  SNF (31)   CODE STATUS: DNR  Allergies  Allergen Reactions  . Codeine Anaphylaxis and Hives  . Morphine And Related Anaphylaxis and Hives  . Penicillins Anaphylaxis  . Ace Inhibitors Cough    Chief Complaint  Patient presents with  . Acute Visit    Care Plan Meeting,     HPI:  We have come together for her routine care plan meeting. BIMS 5/15 mood 6/30. She has lost 16 pounds over the past year; which is favorable. There have been no falls. She is presently not involved in therapy. There are no reports in pain present. There are no reports of depressive or anxious thoughts.   Past Medical History:  Diagnosis Date  . Allergy   . Atrial fibrillation ([place]) 08/2011   First diagnosed in 08/2011; duration of arrhythmia is uncertain  . Bilateral lower extremity edema   . Chronic diarrhea    diverticulosis  . COPD (chronic obstructive pulmonary disease) ([place])   . Dementia ([place])   . Diabetes mellitus, type 2 (HCC)    Diabetic neuropathy  . Dyspnea on exertion    pedal edema  . Gout   . Headache(784.0)    twice weekly  . Hyperlipidemia   . Hypertension   . Osteopenia    DEXA scan 01/2010  . Palpitations   . Seasonal allergies   . Stress incontinence   . Vertigo     Past Surgical History:  Procedure Laterality Date  . APPENDECTOMY    . BREAST BIOPSY  2002   Right  . CESAREAN SECTION     x 2  . CHOLECYSTECTOMY    . COLONOSCOPY  2007   Negative screening study  . COLONOSCOPY WITH PROPOFOL N/A 09/18/2017   Procedure: COLONOSCOPY WITH PROPOFOL;  Surgeon: Daneil Dolin, MD;  [facility];  Service: Endoscopy;  Laterality: N/A;  11:00am  . HAMMER TOE SURGERY     Bilateral hammer toe amputation  . INCISIONAL HERNIA REPAIR    . KNEE ARTHROSCOPY W/ MENISCAL REPAIR  2007   Bilateral  . POLYPECTOMY  09/18/2017   Procedure: POLYPECTOMY;  Surgeon: Daneil Dolin, MD;   [facility];  Service: Endoscopy;;  colon  . UMBILICAL HERNIA REPAIR      Social History   Socioeconomic History  . Marital status: Widowed    Spouse name: Not on file  . Number of children: 2  . Years of education: Not on file  . Highest education level: Not on file  Occupational History  . Occupation: Pension scheme manager  . Financial resource strain: Not hard at all  . Food insecurity    Worry: Never true    Inability: Never true  . Transportation needs    Medical: No    Non-medical: No  Tobacco Use  . Smoking status: Never Smoker  . Smokeless tobacco: Never Used  Substance and Sexual Activity  . Alcohol use: No  . Drug use: No  . Sexual activity: Not on file  Lifestyle  . Physical activity    Days per week: 0 days    Minutes per session: 0 min  . Stress: Not at all  Relationships  . Social Herbalist on phone: Never    Gets together: Never    Attends religious service: Never    Active member of club or organization: No  Attends meetings of clubs or organizations: Never    Relationship status: Widowed  . Intimate partner violence    Fear of current or ex partner: No    Emotionally abused: No    Physically abused: No    Forced sexual activity: No  Other Topics Concern  . Not on file  Social History Narrative  . Not on file   Family History  Adopted: Yes      VITAL SIGNS BP 105/70   Pulse 68   Temp 98 F (36.7 C) (Oral)   Resp 16   Ht 5\' 6"  (1.676 m)   Wt 184 lb 6.4 oz (83.6 kg)   SpO2 98%   BMI 29.76 kg/m   Outpatient Encounter Medications as of 01/30/2019  Medication Sig  . acetaminophen (TYLENOL) 325 MG tablet Take 650 mg by mouth every 6 (six) hours as needed.   Marland Kitchen alendronate (FOSAMAX) 70 MG tablet Give 1 tablet by mouth on Sunday. Take with a full glass of water on an empty stomach.  Marland Kitchen apixaban (ELIQUIS) 5 MG TABS tablet Take 5 mg by mouth 2 (two) times daily.   . carboxymethylcellulose (REFRESH PLUS)  0.5 % SOLN Place 1 drop into both eyes 4 (four) times daily.  . cholecalciferol (VITAMIN D) 1000 UNITS tablet Take 1,000 Units by mouth daily.  . citalopram (CELEXA) 10 MG tablet Take 10 mg by mouth daily.  . [employer] CRANBERRY PO 2 (two) times daily.  Marland Kitchen Dextromethorphan-guaiFENesin (ROBITUSSIN DM PO) Take 15 mLs by mouth every 6 (six) hours as needed. May give up to 48 hours.  Inform physician immediately if cough or congestion is associated with fever or SOB  . digoxin (LANOXIN) 0.125 MG tablet Take 1 tablet by mouth once a day (HOLD FOR AP UNDER 60)  . furosemide (LASIX) 40 MG tablet Take 40 mg by mouth daily.   . insulin aspart (NOVOLOG) 100 UNIT/ML injection Inject 13 Units into the skin 2 (two) times daily. If BS above 150  . Insulin Glargine (BASAGLAR KWIKPEN) 100 UNIT/ML SOPN Inject 50 Units into the skin at bedtime.   . memantine (NAMENDA) 10 MG tablet Take 10 mg by mouth 2 (two) times daily.  . metolazone (ZAROXOLYN) 2.5 MG tablet Take 5 mg by mouth daily. Mon. Fri   . metoprolol tartrate (LOPRESSOR) 25 MG tablet Take 50 mg by mouth 2 (two) times daily.   . Multiple Vitamin (MULTIVITAMIN WITH MINERALS) TABS Take 1 tablet by mouth every morning.  . NON FORMULARY Diet Type:  NAS, consistent CHO, Dysphagia 3 with thin liquids  Give Cranberry Juice two times daily at 9 am and 5 pm  . omeprazole (PRILOSEC) 40 MG capsule Take 40 mg by mouth daily.  . polyethylene glycol (MIRALAX / GLYCOLAX) packet Take 17 g by mouth daily as needed.   . potassium chloride (MICRO-K) 10 MEQ CR capsule Give 4 tablets (40 mg) by mouth two times daily  . pravastatin (PRAVACHOL) 20 MG tablet Take 20 mg by mouth daily. Take along with 40 mg to = 60 mg  . pravastatin (PRAVACHOL) 40 MG tablet Take 40 mg by mouth daily. Take along with 20 mg to = 60 mg  . sitaGLIPtin-metformin (JANUMET) 50-500 MG tablet Take 1 tablet by mouth 2 (two) times daily with a meal.   No facility-administered encounter medications on file as of  01/30/2019.      SIGNIFICANT DIAGNOSTIC EXAMS   LABS REVIEWED PREVIOUS:   01-12-18: dig 0.9 04-26-18: wbc 7.9; hgb  11.7; hct 38.0; mcv 90.9; plt 241; glucose 147; bun 24; creat 1.03 ;k+ 3.6; na++ 138; ca 9.3  05-23-18: hgb a1c 8.0  08-09-18: wbc 10.3 hgb 12.1; hct 39.2 mcv 92.2 ;plt 242; glucose 86 bun 31; creat 1.08; k+ 3.2; na++ 141; ca 9.3; chol 148; ldl 95; trig 107; hdl 32 hgb a1c 7.9 08-23-18: glucose 174; bun 30; creat 0.99; k+ 3.6; na++ 142; ca 9.1  10-09-18: wbc 8.3; hgb 11.9; hct 38.2; mcv 91.6; plt 232; gluocse 203; bun 29; creat 1.06; k+ 3.5;na++ 137; ca 8.2; tsh 2.608; dig 1.0   11-14-18: dig 0.9 12-18-18: hgb a1c 7.8    TODAY  01-24-19: urine micro-albumin: 5.3   Review of Systems  Unable to perform ROS: Dementia (unable to participate )    Physical Exam Constitutional:      General: She is not in acute distress.    Appearance: She is well-developed. She is not diaphoretic.  Neck:     Musculoskeletal: Neck supple.     Thyroid: No thyromegaly.  Cardiovascular:     Rate and Rhythm: Normal rate. Rhythm irregular.     Pulses: Normal pulses.     Heart sounds: Normal heart sounds.  Pulmonary:     Effort: Pulmonary effort is normal. No respiratory distress.     Breath sounds: Normal breath sounds.  Abdominal:     General: Bowel sounds are normal. There is no distension.     Palpations: Abdomen is soft.     Tenderness: There is no abdominal tenderness.  Musculoskeletal:     Right lower leg: No edema.     Left lower leg: No edema.     Comments: Is able to move all extremities   Lymphadenopathy:     Cervical: No cervical adenopathy.  Skin:    General: Skin is warm and dry.  Neurological:     Mental Status: She is alert. Mental status is at baseline.  Psychiatric:        Mood and Affect: Mood normal.       ASSESSMENT/ PLAN:  TODAY:   1.  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 Chronic atrial fibrillation 3. Major depression chronic  recurrent  Will continue current medications Will continue current plan of care Will continue to monitor her status.   MD is aware of resident's narcotic use and is in agreement with current plan of care. We will attempt to wean resident as appropriate.  Ok Edwards NP [facility]  Contact [phone] Monday through Friday 8am- 5pm  After hours call [phone]

## 2019-01-31 LAB — MICROALBUMIN, URINE: Microalb, Ur: 5.3 ug/mL — ABNORMAL HIGH

## 2019-02-19 ENCOUNTER — Other Ambulatory Visit: Payer: Self-pay | Admitting: Adult Health

## 2019-02-20 ENCOUNTER — Other Ambulatory Visit: Payer: Self-pay | Admitting: Adult Health

## 2019-02-20 ENCOUNTER — Encounter: Payer: Self-pay | Admitting: Internal Medicine

## 2019-02-20 ENCOUNTER — Non-Acute Institutional Stay (SKILLED_NURSING_FACILITY): Payer: Medicare Other | Admitting: Internal Medicine

## 2019-02-20 DIAGNOSIS — Z794 Long term (current) use of insulin: Secondary | ICD-10-CM | POA: Diagnosis not present

## 2019-02-20 DIAGNOSIS — N182 Chronic kidney disease, stage 2 (mild): Secondary | ICD-10-CM | POA: Diagnosis not present

## 2019-02-20 DIAGNOSIS — E118 Type 2 diabetes mellitus with unspecified complications: Secondary | ICD-10-CM

## 2019-02-20 DIAGNOSIS — E1169 Type 2 diabetes mellitus with other specified complication: Secondary | ICD-10-CM | POA: Diagnosis not present

## 2019-02-20 DIAGNOSIS — E785 Hyperlipidemia, unspecified: Secondary | ICD-10-CM

## 2019-02-20 DIAGNOSIS — M810 Age-related osteoporosis without current pathological fracture: Secondary | ICD-10-CM | POA: Diagnosis not present

## 2019-02-20 DIAGNOSIS — E1122 Type 2 diabetes mellitus with diabetic chronic kidney disease: Secondary | ICD-10-CM

## 2019-02-20 NOTE — Progress Notes (Signed)
Location:  [place] Room Number: 113-W Place of Service:  SNF (31)  Hennie Duos, MD  Patient Care Team: Hennie Duos, MD as PCP - General (Internal Medicine) Rothbart, Cristopher Estimable, MD (Cardiology) [facility] [place] ([place]) Nyoka Cowden Phylis Bougie, NP as Nurse Practitioner (Geriatric Medicine)  Extended Emergency Contact Information Primary Emergency Contact: [facility] Address: [address]          [place], [place] [address] [name] of [place] Phone: [phone] Mobile Phone: [phone] Relation: Daughter    Allergies: Codeine, Morphine and related, Penicillins, and Ace inhibitors  Chief Complaint  Patient presents with   Medical Management of Chronic Issues    Routine [place] visit    HPI: Patient is a 78 y.o. female who is being seen for routine issues of chronic kidney disease stage II, diabetes mellitus type 2, and hyperlipidemia.  Past Medical History:  Diagnosis Date   Allergy    Atrial fibrillation ([place]) 08/2011   First diagnosed in 08/2011; duration of arrhythmia is uncertain   Bilateral lower extremity edema    Chronic diarrhea    diverticulosis   COPD (chronic obstructive pulmonary disease) (HCC)    Dementia (HCC)    Diabetes mellitus, type 2 ([place])    Diabetic neuropathy   Dyspnea on exertion    pedal edema   Gout    Headache(784.0)    twice weekly   Hyperlipidemia    Hypertension    Osteopenia    DEXA scan 01/2010   Palpitations    Seasonal allergies    Stress incontinence    Vertigo     Past Surgical History:  Procedure Laterality Date   APPENDECTOMY     BREAST BIOPSY  2002   Right   CESAREAN SECTION     x 2   CHOLECYSTECTOMY     COLONOSCOPY  2007   Negative screening study   COLONOSCOPY WITH PROPOFOL N/A 09/18/2017   Procedure: COLONOSCOPY WITH PROPOFOL;  Surgeon: Daneil Dolin, MD;  [facility];  Service: Endoscopy;   Laterality: N/A;  11:00am   HAMMER TOE SURGERY     Bilateral hammer toe amputation   INCISIONAL HERNIA REPAIR     KNEE ARTHROSCOPY W/ MENISCAL REPAIR  2007   Bilateral   POLYPECTOMY  09/18/2017   Procedure: POLYPECTOMY;  Surgeon: Daneil Dolin, MD;  [facility];  Service: Endoscopy;;  colon   UMBILICAL HERNIA REPAIR      Allergies as of 02/20/2019      Reactions   Codeine Anaphylaxis, Hives   Morphine And Related Anaphylaxis, Hives   Penicillins Anaphylaxis   Ace Inhibitors Cough      Medication List    Notice   This visit is during an admission. Changes to the med list made in this visit will be reflected in the After Visit Summary of the admission.    Current Outpatient Medications on File Prior to Visit  Medication Sig Dispense Refill   acetaminophen (TYLENOL) 325 MG tablet Take 650 mg by mouth every 6 (six) hours as needed.      alendronate (FOSAMAX) 70 MG tablet Give 1 tablet by mouth on Sunday. Take with a full glass of water on an empty stomach.     apixaban (ELIQUIS) 5 MG TABS tablet Take 5 mg by mouth 2 (two) times daily.      carboxymethylcellulose (REFRESH PLUS) 0.5 % SOLN Place 1 drop into both eyes 4 (four)  times daily.     cholecalciferol (VITAMIN D) 1000 UNITS tablet Take 1,000 Units by mouth daily.     citalopram (CELEXA) 10 MG tablet Take 10 mg by mouth daily.     Dextromethorphan-guaiFENesin (ROBITUSSIN DM PO) Take 15 mLs by mouth every 6 (six) hours as needed. May give up to 48 hours.  Inform physician immediately if cough or congestion is associated with fever or SOB     digoxin (LANOXIN) 0.125 MG tablet Take 1 tablet by mouth once a day (HOLD FOR AP UNDER 60)     furosemide (LASIX) 40 MG tablet Take 40 mg by mouth daily.      insulin aspart (NOVOLOG FLEXPEN) 100 UNIT/ML FlexPen Inject 10 Units into the skin daily as needed for high blood sugar. Give at 8 AM     insulin aspart (NOVOLOG) 100 UNIT/ML injection Inject 13 Units into  the skin 2 (two) times daily. If BS above 150     Insulin Glargine (BASAGLAR KWIKPEN) 100 UNIT/ML SOPN Inject 50 Units into the skin at bedtime.      memantine (NAMENDA) 10 MG tablet Take 10 mg by mouth 2 (two) times daily.     metolazone (ZAROXOLYN) 2.5 MG tablet Take 5 mg by mouth See admin instructions. Mon and Friday.  2 tabs to = 5 mg     metoprolol tartrate (LOPRESSOR) 25 MG tablet Take 50 mg by mouth 2 (two) times daily.      Multiple Vitamin (MULTIVITAMIN WITH MINERALS) TABS Take 1 tablet by mouth every morning.     NON FORMULARY Diet Type:  NAS, consistent CHO, Dysphagia 3 with thin liquids  Give Cranberry Juice two times daily at 9 am and 5 pm     omeprazole (PRILOSEC) 40 MG capsule Take 40 mg by mouth daily.     polyethylene glycol (MIRALAX / GLYCOLAX) packet Take 17 g by mouth daily as needed.      potassium chloride (MICRO-K) 10 MEQ CR capsule Give 4 tablets (40 mg) by mouth two times daily     pravastatin (PRAVACHOL) 20 MG tablet Take 20 mg by mouth daily. Take along with 40 mg to = 60 mg     pravastatin (PRAVACHOL) 40 MG tablet Take 40 mg by mouth daily. Take along with 20 mg to = 60 mg     sitaGLIPtin-metformin (JANUMET) 50-500 MG tablet Take 1 tablet by mouth 2 (two) times daily with a meal.     No current facility-administered medications on file prior to visit.      No orders of the defined types were placed in this encounter.   Immunization History  Administered Date(s) Administered   Influenza-Unspecified 02/03/2017, 02/01/2018, 02/04/2019   Pneumococcal Conjugate-13 02/03/2017   Pneumococcal-Unspecified 02/08/2016   Tdap 01/19/2017   Zoster 08/02/2018    Social History   Tobacco Use   Smoking status: Never Smoker   Smokeless tobacco: Never Used  Substance Use Topics   Alcohol use: No    Review of Systems    unable to obtain secondary to dementia; nursing-no acute concerns      Vitals:   02/20/19 1626  BP: 119/70  Pulse: 64   Resp: (!) 24  Temp: 98.6 F (37 C)  SpO2: 98%   Body mass index is 29.7 kg/m. Physical Exam  GENERAL APPEARANCE: Alert,  No acute distress  SKIN: No diaphoresis rash HEENT: Unremarkable RESPIRATORY: Breathing is even, unlabored. Lung sounds are clear   CARDIOVASCULAR: Heart RRR no murmurs, rubs or  gallops. No peripheral edema  GASTROINTESTINAL: Abdomen is soft, non-tender, not distended w/ normal bowel sounds.  GENITOURINARY: Bladder non tender, not distended  MUSCULOSKELETAL: No abnormal joints or musculature NEUROLOGIC: Cranial nerves 2-12 grossly intact. Moves all extremities PSYCHIATRIC: Mood and affect with dementia, no behavioral issues  Patient Active Problem List   Diagnosis Date Noted   Controlled diabetes mellitus type 2 with complications ([place]) XX123456   Bradycardia 11/14/2018   Hypertension associated with stage 2 chronic kidney disease due to type 2 diabetes mellitus ([place]) 09/27/2018   Hypokalemia 07/02/2018   GERD without esophagitis 07/02/2018   Dyslipidemia associated with type 2 diabetes mellitus ([place]) 07/02/2018   CKD stage 2 due to type 2 diabetes mellitus ([place]) 07/02/2018   Major depression, recurrent, chronic (HCC) 07/02/2018   Anemia 09/14/2017   Bilateral leg edema 01/10/2017   Osteoporosis 08/09/2016   Diabetic neuropathy ([place]) 09/29/2015   Dementia with behavioral disturbance ([place]) 02/24/2015   Chronic atrial fibrillation (HCC)    Chronic anticoagulation 09/21/2011   Hypertension    Type 2 diabetes, uncontrolled, with peripheral circulatory disorder (HCC)     CMP     Component Value Date/Time   NA 137 10/09/2018 0702   NA 139 06/30/2015 1712   K 3.5 10/09/2018 0702   CL 99 10/09/2018 0702   CO2 29 10/09/2018 0702   GLUCOSE 203 (H) 10/09/2018 0702   BUN 29 (H) 10/09/2018 0702   BUN 15 06/30/2015 1712   CREATININE 1.06 (H) 10/09/2018 0702   CREATININE 0.90 07/04/2012 1326   CALCIUM 9.2 10/09/2018 0702   PROT 7.9  11/08/2017 1625   PROT 7.3 06/30/2015 1712   ALBUMIN 3.3 (L) 11/08/2017 1625   ALBUMIN 3.5 06/30/2015 1712   AST 25 11/08/2017 1625   ALT 19 11/08/2017 1625   ALKPHOS 52 11/08/2017 1625   BILITOT 0.5 11/08/2017 1625   BILITOT 0.2 06/30/2015 1712   GFRNONAA 50 (L) 10/09/2018 0702   GFRAA 58 (L) 10/09/2018 0702   Recent Labs    08/09/18 0318 08/23/18 0500 10/09/18 0702  NA 141 142 137  K 3.2* 3.6 3.5  CL 99 105 99  CO2 32 30 29  GLUCOSE 86 174* 203*  BUN 31* 30* 29*  CREATININE 1.08* 0.99 1.06*  CALCIUM 9.3 9.1 9.2   No results for input(s): AST, ALT, ALKPHOS, BILITOT, PROT, ALBUMIN in the last 8760 hours. Recent Labs    04/26/18 0740 08/09/18 0318 10/09/18 0702  WBC 7.9 10.3 8.3  NEUTROABS 4.8 6.4 5.3  HGB 11.7* 12.1 11.9*  HCT 38.0 39.2 38.2  MCV 90.9 92.2 91.6  PLT 241 242 232   Recent Labs    08/09/18 0318  CHOL 148  LDLCALC 95  TRIG 107   Lab Results  Component Value Date   MICROALBUR 5.3 (H) 01/24/2019   Lab Results  Component Value Date   TSH 2.608 10/09/2018   Lab Results  Component Value Date   HGBA1C 7.8 (H) 12/18/2018   Lab Results  Component Value Date   CHOL 148 08/09/2018   HDL 32 (L) 08/09/2018   LDLCALC 95 08/09/2018   TRIG 107 08/09/2018   CHOLHDL 4.6 08/09/2018    Significant Diagnostic Results in last 30 days:  No results found.  Assessment and Plan  CKD stage 2 due to type 2 diabetes mellitus (HCC) GFR 50 with a creatinine of 1.06; will monitor at intervals  Controlled diabetes mellitus type 2 with complications (HCC) 123456 7.8; continue Janumet 50/501 p.o. twice daily,  glargine insulin 50 units daily and sliding scale insulin twice a day with NovoLog  Dyslipidemia associated with type 2 diabetes mellitus ([place]) LDL is 95 patient is on Pravachol 40 mg daily; continue current regimen     Hennie Duos, MD

## 2019-02-22 DIAGNOSIS — B351 Tinea unguium: Secondary | ICD-10-CM | POA: Diagnosis not present

## 2019-02-22 DIAGNOSIS — E1140 Type 2 diabetes mellitus with diabetic neuropathy, unspecified: Secondary | ICD-10-CM | POA: Diagnosis not present

## 2019-02-22 DIAGNOSIS — Z7984 Long term (current) use of oral hypoglycemic drugs: Secondary | ICD-10-CM | POA: Diagnosis not present

## 2019-02-22 DIAGNOSIS — L603 Nail dystrophy: Secondary | ICD-10-CM | POA: Diagnosis not present

## 2019-02-22 DIAGNOSIS — E1151 Type 2 diabetes mellitus with diabetic peripheral angiopathy without gangrene: Secondary | ICD-10-CM | POA: Diagnosis not present

## 2019-02-22 LAB — HM DIABETES FOOT EXAM

## 2019-02-23 ENCOUNTER — Encounter: Payer: Self-pay | Admitting: Internal Medicine

## 2019-02-23 DIAGNOSIS — E118 Type 2 diabetes mellitus with unspecified complications: Secondary | ICD-10-CM | POA: Insufficient documentation

## 2019-02-23 NOTE — Assessment & Plan Note (Signed)
A1c 7.8; continue Janumet 50/501 p.o. twice daily, glargine insulin 50 units daily and sliding scale insulin twice a day with NovoLog

## 2019-02-23 NOTE — Assessment & Plan Note (Signed)
LDL is 95 patient is on Pravachol 40 mg daily; continue current regimen

## 2019-02-23 NOTE — Assessment & Plan Note (Signed)
GFR 50 with a creatinine of 1.06; will monitor at intervals

## 2019-02-25 ENCOUNTER — Encounter: Payer: Self-pay | Admitting: Adult Health

## 2019-02-25 ENCOUNTER — Non-Acute Institutional Stay (SKILLED_NURSING_FACILITY): Payer: Medicare Other | Admitting: Adult Health

## 2019-02-25 DIAGNOSIS — M810 Age-related osteoporosis without current pathological fracture: Secondary | ICD-10-CM | POA: Diagnosis not present

## 2019-02-25 NOTE — Progress Notes (Signed)
Location:    [place] Room Number: 113/W Place of Service:  SNF (31)   CODE STATUS: DNR  Allergies  Allergen Reactions  . Codeine Anaphylaxis and Hives  . Morphine And Related Anaphylaxis and Hives  . Penicillins Anaphylaxis  . Ace Inhibitors Cough    Chief Complaint  Patient presents with  . Follow-up    Osteoporosis    HPI:  She is a on chronic fosamax weekly for her osteoporosis without history of fracture. Her t score is -3.504.   No reports of  any joint or back pain. No reports of heart burn; nausea or vomiting.    Past Medical History:  Diagnosis Date  . Allergy   . Atrial fibrillation ([place]) 08/2011   First diagnosed in 08/2011; duration of arrhythmia is uncertain  . Bilateral lower extremity edema   . Chronic diarrhea    diverticulosis  . COPD (chronic obstructive pulmonary disease) ([place])   . Dementia ([address])   . Diabetes mellitus, type 2 (HCC)    Diabetic neuropathy  . Dyspnea on exertion    pedal edema  . Gout   . Headache(784.0)    twice weekly  . Hyperlipidemia   . Hypertension   . Osteopenia    DEXA scan 01/2010  . Palpitations   . Seasonal allergies   . Stress incontinence   . Vertigo     Past Surgical History:  Procedure Laterality Date  . APPENDECTOMY    . BREAST BIOPSY  2002   Right  . CESAREAN SECTION     x 2  . CHOLECYSTECTOMY    . COLONOSCOPY  2007   Negative screening study  . COLONOSCOPY WITH PROPOFOL N/A 09/18/2017   Procedure: COLONOSCOPY WITH PROPOFOL;  Surgeon: Daneil Dolin, MD;  [facility];  Service: Endoscopy;  Laterality: N/A;  11:00am  . HAMMER TOE SURGERY     Bilateral hammer toe amputation  . INCISIONAL HERNIA REPAIR    . KNEE ARTHROSCOPY W/ MENISCAL REPAIR  2007   Bilateral  . POLYPECTOMY  09/18/2017   Procedure: POLYPECTOMY;  Surgeon: Daneil Dolin, MD;  [facility]: [facility];  Service: Endoscopy;;  colon  . UMBILICAL HERNIA REPAIR      Social History   Socioeconomic  History  . Marital status: Widowed    Spouse name: Not on file  . Number of children: 2  . Years of education: Not on file  . Highest education level: Not on file  Occupational History  . Occupation: Pension scheme manager  . Financial resource strain: Not hard at all  . Food insecurity    Worry: Never true    Inability: Never true  . Transportation needs    Medical: No    Non-medical: No  Tobacco Use  . Smoking status: Never Smoker  . Smokeless tobacco: Never Used  Substance and Sexual Activity  . Alcohol use: No  . Drug use: No  . Sexual activity: Not on file  Lifestyle  . Physical activity    Days per week: 0 days    Minutes per session: 0 min  . Stress: Not at all  Relationships  . Social Herbalist on phone: Never    Gets together: Never    Attends religious service: Never    Active member of club or organization: No    Attends meetings of clubs or organizations: Never    Relationship status: Widowed  . Intimate partner violence  Fear of current or ex partner: No    Emotionally abused: No    Physically abused: No    Forced sexual activity: No  Other Topics Concern  . Not on file  Social History Narrative  . Not on file   Family History  Adopted: Yes      VITAL SIGNS BP 123/74   Pulse 86   Temp 98.4 F (36.9 C) (Oral)   Resp 20   Ht 5\' 6"  (1.676 m)   Wt 184 lb (83.5 kg)   SpO2 98%   BMI 29.70 kg/m   Outpatient Encounter Medications as of 02/25/2019  Medication Sig  . acetaminophen (TYLENOL) 325 MG tablet Take 650 mg by mouth every 6 (six) hours as needed.   Marland Kitchen apixaban (ELIQUIS) 5 MG TABS tablet Take 5 mg by mouth 2 (two) times daily.   . carboxymethylcellulose (REFRESH PLUS) 0.5 % SOLN Place 1 drop into both eyes 4 (four) times daily.  . cholecalciferol (VITAMIN D) 1000 UNITS tablet Take 1,000 Units by mouth daily.  . citalopram (CELEXA) 10 MG tablet Take 10 mg by mouth daily.  Marland Kitchen CRANBERRY FRUIT PO Take by mouth. Give  twice a day  . denosumab (PROLIA) 60 MG/ML SOSY injection Inject 60 mg into the skin every 6 (six) months.  . Dextromethorphan-guaiFENesin (ROBITUSSIN DM PO) Take 15 mLs by mouth every 6 (six) hours as needed. May give up to 48 hours.  Inform physician immediately if cough or congestion is associated with fever or SOB  . digoxin (LANOXIN) 0.125 MG tablet Take 1 tablet by mouth once a day (HOLD FOR AP UNDER 60)  . furosemide (LASIX) 40 MG tablet Take 40 mg by mouth daily.   . insulin aspart (NOVOLOG FLEXPEN) 100 UNIT/ML FlexPen Inject 10 Units into the skin daily as needed for high blood sugar. Give at 8 AM  . insulin aspart (NOVOLOG) 100 UNIT/ML injection Inject 13 Units into the skin 2 (two) times daily. If BS above 150  . Insulin Glargine (BASAGLAR KWIKPEN) 100 UNIT/ML SOPN Inject 50 Units into the skin at bedtime.   . memantine (NAMENDA) 10 MG tablet Take 10 mg by mouth 2 (two) times daily.  . metolazone (ZAROXOLYN) 2.5 MG tablet Take 5 mg by mouth See admin instructions. Mon and Friday.  2 tabs to = 5 mg  . metoprolol tartrate (LOPRESSOR) 25 MG tablet Take 50 mg by mouth 2 (two) times daily.   . Multiple Vitamin (MULTIVITAMIN WITH MINERALS) TABS Take 1 tablet by mouth every morning.  . NON FORMULARY Diet Type:  NAS, consistent CHO, Dysphagia 3 with thin liquids  Give Cranberry Juice two times daily at 9 am and 5 pm  . omeprazole (PRILOSEC) 40 MG capsule Take 40 mg by mouth daily.  . polyethylene glycol (MIRALAX / GLYCOLAX) packet Take 17 g by mouth daily as needed.   . potassium chloride (MICRO-K) 10 MEQ CR capsule Give 4 tablets (40 mg) by mouth two times daily  . pravastatin (PRAVACHOL) 20 MG tablet Take 20 mg by mouth daily. Take along with 40 mg to = 60 mg  . pravastatin (PRAVACHOL) 40 MG tablet Take 40 mg by mouth daily. Take along with 20 mg to = 60 mg  . sitaGLIPtin-metformin (JANUMET) 50-500 MG tablet Take 1 tablet by mouth 2 (two) times daily with a meal.  . [DISCONTINUED]  alendronate (FOSAMAX) 70 MG tablet Give 1 tablet by mouth on Sunday. Take with a full glass of water on an empty  stomach.   No facility-administered encounter medications on file as of 02/25/2019.      SIGNIFICANT DIAGNOSTIC EXAMS  TODAY  02-21-19: DEXA: t score -3.504   LABS REVIEWED PREVIOUS:   04-26-18: wbc 7.9; hgb 11.7; hct 38.0; mcv 90.9; plt 241; glucose 147; bun 24; creat 1.03 ;k+ 3.6; na++ 138; ca 9.3  05-23-18: hgb a1c 8.0  08-09-18: wbc 10.3 hgb 12.1; hct 39.2 mcv 92.2 ;plt 242; glucose 86 bun 31; creat 1.08; k+ 3.2; na++ 141; ca 9.3; chol 148; ldl 95; trig 107; hdl 32 hgb a1c 7.9 08-23-18: glucose 174; bun 30; creat 0.99; k+ 3.6; na++ 142; ca 9.1  10-09-18: wbc 8.3; hgb 11.9; hct 38.2; mcv 91.6; plt 232; gluocse 203; bun 29; creat 1.06; k+ 3.5;na++ 137; ca 8.2; tsh 2.608; dig 1.0   11-14-18: dig 0.9 12-18-18: hgb a1c 7.8   01-24-19: urine micro-albumin: 5.3  NO NEW LABS.   Review of Systems  Unable to perform ROS: Dementia (unable to participate )    Physical Exam Constitutional:      General: She is not in acute distress.    Appearance: She is well-developed. She is not diaphoretic.  Neck:     Musculoskeletal: Neck supple.     Thyroid: No thyromegaly.  Cardiovascular:     Rate and Rhythm: Normal rate. Rhythm irregular.     Heart sounds: Normal heart sounds.  Pulmonary:     Effort: Pulmonary effort is normal. No respiratory distress.     Breath sounds: Normal breath sounds.  Abdominal:     General: Bowel sounds are normal. There is no distension.     Palpations: Abdomen is soft.     Tenderness: There is no abdominal tenderness.  Musculoskeletal:     Right lower leg: No edema.     Left lower leg: No edema.     Comments: Able to move all extremities   Lymphadenopathy:     Cervical: No cervical adenopathy.  Skin:    General: Skin is warm and dry.  Neurological:     Mental Status: She is alert. Mental status is at baseline.  Psychiatric:        Mood and  Affect: Mood normal.      ASSESSMENT/ PLAN:  TODAY  1. Age related osteoporosis without current pathological fracture: her t score remains significant at -3.504 will stop fosamax and will begin prolia 60 mg every 6 months and will monitor her status.     MD is aware of resident's narcotic use and is in agreement with current plan of care. We will attempt to wean resident as appropriate.  Ok Edwards NP [facility]  Contact [phone] Monday through Friday 8am- 5pm  After hours call [phone]

## 2019-03-11 DIAGNOSIS — Z7984 Long term (current) use of oral hypoglycemic drugs: Secondary | ICD-10-CM | POA: Diagnosis not present

## 2019-03-11 DIAGNOSIS — E113293 Type 2 diabetes mellitus with mild nonproliferative diabetic retinopathy without macular edema, bilateral: Secondary | ICD-10-CM | POA: Diagnosis not present

## 2019-03-11 DIAGNOSIS — H04123 Dry eye syndrome of bilateral lacrimal glands: Secondary | ICD-10-CM | POA: Diagnosis not present

## 2019-03-11 DIAGNOSIS — Z794 Long term (current) use of insulin: Secondary | ICD-10-CM | POA: Diagnosis not present

## 2019-03-11 LAB — HM DIABETES EYE EXAM

## 2019-03-20 ENCOUNTER — Non-Acute Institutional Stay (SKILLED_NURSING_FACILITY): Payer: Medicare Other | Admitting: Adult Health

## 2019-03-20 ENCOUNTER — Encounter: Payer: Self-pay | Admitting: Adult Health

## 2019-03-20 DIAGNOSIS — E1169 Type 2 diabetes mellitus with other specified complication: Secondary | ICD-10-CM | POA: Diagnosis not present

## 2019-03-20 DIAGNOSIS — E1122 Type 2 diabetes mellitus with diabetic chronic kidney disease: Secondary | ICD-10-CM

## 2019-03-20 DIAGNOSIS — E785 Hyperlipidemia, unspecified: Secondary | ICD-10-CM | POA: Diagnosis not present

## 2019-03-20 DIAGNOSIS — I129 Hypertensive chronic kidney disease with stage 1 through stage 4 chronic kidney disease, or unspecified chronic kidney disease: Secondary | ICD-10-CM | POA: Diagnosis not present

## 2019-03-20 DIAGNOSIS — N182 Chronic kidney disease, stage 2 (mild): Secondary | ICD-10-CM

## 2019-03-20 NOTE — Progress Notes (Signed)
Location:  [place] Room Number: 113-W Place of Service:  SNF (31)   CODE STATUS: DNR  Allergies  Allergen Reactions  . Codeine Anaphylaxis and Hives  . Morphine And Related Anaphylaxis and Hives  . Penicillins Anaphylaxis  . Ace Inhibitors Cough    Chief Complaint  Patient presents with  . Medical Management of Chronic Issues        Dyslipidemia associated with type 2 diabetes mellitus:  CKD stage 2 due to type 2 diabetes mellitus:   Hypertension; associated with stage 2 chronic disease due to type 2 diabetes mellitus:    HPI:  She is a 78 year old long term resident of this facility being seen for the management of her chronic illnesses: dyslipidemia; ckd stage 2; hypertension. There are no reports of changes in her appetite; she has lost one pound over the past month which is insignificant. No reports of anxiety or agitation. No report of uncontrolled pain.   Past Medical History:  Diagnosis Date  . Allergy   . Atrial fibrillation ([place]) 08/2011   First diagnosed in 08/2011; duration of arrhythmia is uncertain  . Bilateral lower extremity edema   . Chronic diarrhea    diverticulosis  . COPD (chronic obstructive pulmonary disease) ([place] )   . Dementia ([place])   . Diabetes mellitus, type 2 (HCC)    Diabetic neuropathy  . Dyspnea on exertion    pedal edema  . Gout   . Headache(784.0)    twice weekly  . Hyperlipidemia   . Hypertension   . Osteopenia    DEXA scan 01/2010  . Palpitations   . Seasonal allergies   . Stress incontinence   . Vertigo     Past Surgical History:  Procedure Laterality Date  . APPENDECTOMY    . BREAST BIOPSY  2002   Right  . CESAREAN SECTION     x 2  . CHOLECYSTECTOMY    . COLONOSCOPY  2007   Negative screening study  . COLONOSCOPY WITH PROPOFOL N/A 09/18/2017   Procedure: COLONOSCOPY WITH PROPOFOL;  Surgeon: Daneil Dolin, MD;  [facility];  Service: Endoscopy;  Laterality: N/A;  11:00am  . HAMMER  TOE SURGERY     Bilateral hammer toe amputation  . INCISIONAL HERNIA REPAIR    . KNEE ARTHROSCOPY W/ MENISCAL REPAIR  2007   Bilateral  . POLYPECTOMY  09/18/2017   Procedure: POLYPECTOMY;  Surgeon: Daneil Dolin, MD;  [facility];  Service: Endoscopy;;  colon  . UMBILICAL HERNIA REPAIR      Social History   Socioeconomic History  . Marital status: Widowed    Spouse name: Not on file  . Number of children: 2  . Years of education: Not on file  . Highest education level: Not on file  Occupational History  . Occupation: Pension scheme manager  . Financial resource strain: Not hard at all  . Food insecurity    Worry: Never true    Inability: Never true  . Transportation needs    Medical: No    Non-medical: No  Tobacco Use  . Smoking status: Never Smoker  . Smokeless tobacco: Never Used  Substance and Sexual Activity  . Alcohol use: No  . Drug use: No  . Sexual activity: Not on file  Lifestyle  . Physical activity    Days per week: 0 days    Minutes per session: 0 min  . Stress: Not at all  Relationships  . Social Herbalist on phone: Never    Gets together: Never    Attends religious service: Never    Active member of club or organization: No    Attends meetings of clubs or organizations: Never    Relationship status: Widowed  . Intimate partner violence    Fear of current or ex partner: No    Emotionally abused: No    Physically abused: No    Forced sexual activity: No  Other Topics Concern  . Not on file  Social History Narrative  . Not on file   Family History  Adopted: Yes      VITAL SIGNS BP 121/70   Pulse 65   Temp 98.2 F (36.8 C) (Oral)   Resp 18   Ht 5\' 6"  (1.676 m)   Wt 183 lb 6.4 oz (83.2 kg)   SpO2 98%   BMI 29.60 kg/m   Outpatient Encounter Medications as of 03/20/2019  Medication Sig  . acetaminophen (TYLENOL) 325 MG tablet Take 650 mg by mouth every 6 (six) hours as needed.   Marland Kitchen apixaban (ELIQUIS)  5 MG TABS tablet Take 5 mg by mouth 2 (two) times daily.   . carboxymethylcellulose (REFRESH PLUS) 0.5 % SOLN Place 1 drop into both eyes 4 (four) times daily.  . cholecalciferol (VITAMIN D) 1000 UNITS tablet Take 1,000 Units by mouth daily.  . citalopram (CELEXA) 10 MG tablet Take 10 mg by mouth daily.  Marland Kitchen CRANBERRY FRUIT PO Take by mouth. Give twice a day  . denosumab (PROLIA) 60 MG/ML SOSY injection Inject 60 mg into the skin every 6 (six) months.  . digoxin (LANOXIN) 0.125 MG tablet Take 1 tablet by mouth once a day (HOLD FOR AP UNDER 60)  . furosemide (LASIX) 40 MG tablet Take 40 mg by mouth daily.   . insulin aspart (NOVOLOG FLEXPEN) 100 UNIT/ML FlexPen Inject 10 Units into the skin daily as needed for high blood sugar. Give at 8 AM  . insulin aspart (NOVOLOG) 100 UNIT/ML injection Inject 13 Units into the skin 2 (two) times daily. If BS above 150  . Insulin Glargine (BASAGLAR KWIKPEN) 100 UNIT/ML SOPN Inject 50 Units into the skin at bedtime.   . memantine (NAMENDA) 10 MG tablet Take 10 mg by mouth 2 (two) times daily.  . metolazone (ZAROXOLYN) 2.5 MG tablet Take 5 mg by mouth See admin instructions. Mon and Friday.  2 tabs to = 5 mg  . metoprolol tartrate (LOPRESSOR) 25 MG tablet Take 50 mg by mouth 2 (two) times daily.   . Multiple Vitamin (MULTIVITAMIN WITH MINERALS) TABS Take 1 tablet by mouth every morning.  . NON FORMULARY Diet Type:  NAS, consistent CHO, Dysphagia 3 with thin liquids  Give Cranberry Juice two times daily at 9 am and 5 pm  . omeprazole (PRILOSEC) 40 MG capsule Take 40 mg by mouth daily.  . polyethylene glycol (MIRALAX / GLYCOLAX) packet Take 17 g by mouth daily as needed.   . potassium chloride (MICRO-K) 10 MEQ CR capsule Give 4 tablets (40 mg) by mouth two times daily  . pravastatin (PRAVACHOL) 20 MG tablet Take 20 mg by mouth daily. Take along with 40 mg to = 60 mg  . pravastatin (PRAVACHOL) 40 MG tablet Take 40 mg by mouth daily. Take along with 20 mg to = 60 mg   . sitaGLIPtin-metformin (JANUMET) 50-500 MG tablet Take 1 tablet by mouth 2 (two) times daily with a meal.  . [  DISCONTINUED] Dextromethorphan-guaiFENesin (ROBITUSSIN DM PO) Take 15 mLs by mouth every 6 (six) hours as needed. May give up to 48 hours.  Inform physician immediately if cough or congestion is associated with fever or SOB   No facility-administered encounter medications on file as of 03/20/2019.      SIGNIFICANT DIAGNOSTIC EXAMS  PREVIOUS  02-21-19: DEXA: t score -3.504  NO NEW EXAMS.    LABS REVIEWED PREVIOUS:   04-26-18: wbc 7.9; hgb 11.7; hct 38.0; mcv 90.9; plt 241; glucose 147; bun 24; creat 1.03 ;k+ 3.6; na++ 138; ca 9.3  05-23-18: hgb a1c 8.0  08-09-18: wbc 10.3 hgb 12.1; hct 39.2 mcv 92.2 ;plt 242; glucose 86 bun 31; creat 1.08; k+ 3.2; na++ 141; ca 9.3; chol 148; ldl 95; trig 107; hdl 32 hgb a1c 7.9 08-23-18: glucose 174; bun 30; creat 0.99; k+ 3.6; na++ 142; ca 9.1  10-09-18: wbc 8.3; hgb 11.9; hct 38.2; mcv 91.6; plt 232; gluocse 203; bun 29; creat 1.06; k+ 3.5;na++ 137; ca 8.2; tsh 2.608; dig 1.0   11-14-18: dig 0.9 12-18-18: hgb a1c 7.8   01-24-19: urine micro-albumin: 5.3  NO NEW LABS.   Review of Systems  Unable to perform ROS: Dementia (unable to participate )   Physical Exam Constitutional:      General: She is not in acute distress.    Appearance: She is well-developed. She is not diaphoretic.  Neck:     Musculoskeletal: Neck supple.     Thyroid: No thyromegaly.  Cardiovascular:     Rate and Rhythm: Normal rate. Rhythm irregular.     Pulses: Normal pulses.     Heart sounds: Normal heart sounds.  Pulmonary:     Effort: Pulmonary effort is normal. No respiratory distress.     Breath sounds: Normal breath sounds.  Abdominal:     General: Bowel sounds are normal. There is no distension.     Palpations: Abdomen is soft.     Tenderness: There is no abdominal tenderness.  Musculoskeletal: Normal range of motion.     Right lower leg: No edema.     Left  lower leg: No edema.  Lymphadenopathy:     Cervical: No cervical adenopathy.  Skin:    General: Skin is warm and dry.  Neurological:     Mental Status: She is alert. Mental status is at baseline.  Psychiatric:        Mood and Affect: Mood normal.     ASSESSMENT/ PLAN:  TODAY:   1. Dyslipidemia associated with type 2 diabetes mellitus: is stable LDL 95 will continue pravachol 60 mg daily   2. CKD stage 2 due to type 2 diabetes mellitus: is stable bun 29; creat 1.06  3. Hypertension; associated with stage 2 chronic disease due to type 2 diabetes mellitus: is stable b/p 121/70 will continue lopressor 50 mg twice daily    PREVIOUS   4. Chronic atrial fibrillation: heart rate is table will continue digoxin 0.125 mg daily and lopressor 50 mg twice daily for rate control is on eliquis 5 mg twice daily   5. Bilateral lower extremity edema: is stable will continue lasix 40 mg daily and zaroxolyn 5 mg twice weekly   6. Hypokalemia is stable k+ 2.5 will continue k+ 40 meq twice daily   7. GERD without esophagitis: is stable will continue prilosec 40 mg daily  8. Unspecified dementia with behavioral disturbance: is without change is slowly losing weight: weight is 183  pounds; will continue namenda 10 mg twice  daily will monitor .  Weight loss is an unfortunate outcome in later stages of this disease process.   9. Age related osteoporosis without current pathological fracture: is stable T score -3.504 will continue prolia 60 mg every 6 months  with supplements  10. Major depression recurrent chronic: is emotionally stable will continue celexa 10 mg daily   11. Type 2 diabetes uncontrolled with periperal circulatory disorder: hgb a1c 7.9 will continue junamet 50/500 twice daily basaglar 50 units nightly and novolog 10 units with breakfast 13 units with lunch and supper.    MD is aware of resident's narcotic use and is in agreement with current plan of care. We will attempt to wean  resident as appropriate.  Ok Edwards NP [facility]  Contact [phone] Monday through Friday 8am- 5pm  After hours call [phone]

## 2019-03-22 DIAGNOSIS — F331 Major depressive disorder, recurrent, moderate: Secondary | ICD-10-CM | POA: Diagnosis not present

## 2019-03-22 DIAGNOSIS — G301 Alzheimer's disease with late onset: Secondary | ICD-10-CM | POA: Diagnosis not present

## 2019-03-27 ENCOUNTER — Ambulatory Visit (HOSPITAL_COMMUNITY): Payer: Medicare Other

## 2019-04-01 ENCOUNTER — Non-Acute Institutional Stay (SKILLED_NURSING_FACILITY): Payer: Medicare Other | Admitting: Internal Medicine

## 2019-04-01 ENCOUNTER — Encounter: Payer: Self-pay | Admitting: Internal Medicine

## 2019-04-01 DIAGNOSIS — N182 Chronic kidney disease, stage 2 (mild): Secondary | ICD-10-CM

## 2019-04-01 DIAGNOSIS — E1122 Type 2 diabetes mellitus with diabetic chronic kidney disease: Secondary | ICD-10-CM

## 2019-04-01 DIAGNOSIS — I482 Chronic atrial fibrillation, unspecified: Secondary | ICD-10-CM | POA: Diagnosis not present

## 2019-04-01 DIAGNOSIS — R600 Localized edema: Secondary | ICD-10-CM | POA: Diagnosis not present

## 2019-04-01 NOTE — Progress Notes (Signed)
This is an acute visit.  Level care skilled.  Facility is [facility].  Chief complaint-acute visit secondary to assess need for metolazone.  History of present illness.  Patient is a pleasant 78 year old female who is a long-term resident of the facility-her diagnoses include atrial fibrillation dementia as well as type 2 diabetes chronic kidney disease stage II-hypertension and lower extremity edema.  Her cardiac echo done in 2016 ejection fraction is normal at 55 to 60%-diastolic dysfunction could not be assessed because of her atrial fibrillation  She is on Lasix 40 mg a day and metolazone 5 mg on Monday  and Fridays along with potassium supplementation.  Her weight and edema appears to be stable and has been stable now it appears for some time.       Pharmacy has notified facility that metolazone is not currently reimbursed by insurance.  Apparently she missed her dose on Friday.  I did discuss this with nursing and they have discussed it with pharmacy and they will be apparently sending the medication today she receives it on Mondays and Fridays  Patient herself appears to be stable appears his weights been stable now for some time-she is lying in bed comfortably does not report any shortness of breath or chest pain-she is a poor historian secondary to dementia but nursing staff concurs that she appears to be doing well.  Past Medical History:  Diagnosis Date  . Allergy   . Atrial fibrillation ([place]) 08/2011   First diagnosed in 08/2011; duration of arrhythmia is uncertain  . Bilateral lower extremity edema   . Chronic diarrhea    diverticulosis  . COPD (chronic obstructive pulmonary disease) ([place])   . Dementia ([place])   . Diabetes mellitus, type 2 (HCC)    Diabetic neuropathy  . Dyspnea on exertion    pedal edema  . Gout   . Headache(784.0)    twice weekly  . Hyperlipidemia   . Hypertension   . Osteopenia    DEXA scan 01/2010  .  Palpitations   . Seasonal allergies   . Stress incontinence   . Vertigo          Past Surgical History:  Procedure Laterality Date  . APPENDECTOMY    . BREAST BIOPSY  2002   Right  . CESAREAN SECTION     x 2  . CHOLECYSTECTOMY    . COLONOSCOPY  2007   Negative screening study  . COLONOSCOPY WITH PROPOFOL N/A 09/18/2017   Procedure: COLONOSCOPY WITH PROPOFOL;  Surgeon: Daneil Dolin, MD;  [facility]: [facility];  Service: Endoscopy;  Laterality: N/A;  11:00am  . HAMMER TOE SURGERY     Bilateral hammer toe amputation  . INCISIONAL HERNIA REPAIR    . KNEE ARTHROSCOPY W/ MENISCAL REPAIR  2007   Bilateral  . POLYPECTOMY  09/18/2017   Procedure: POLYPECTOMY;  Surgeon: Daneil Dolin, MD;  Location: [facility];  Service: Endoscopy;;  colon  . UMBILICAL HERNIA REPAIR      Social History        Socioeconomic History  . Marital status: Widowed    Spouse name: Not on file  . Number of children: 2  . Years of education: Not on file  . Highest education level: Not on file  Occupational History  . Occupation: Pension scheme manager  . Financial resource strain: Not hard at all  . Food insecurity    Worry: Never true    Inability: Never true  .  Transportation needs    Medical: No    Non-medical: No  Tobacco Use  . Smoking status: Never Smoker  . Smokeless tobacco: Never Used  Substance and Sexual Activity  . Alcohol use: No  . Drug use: No  . Sexual activity: Not on file  Lifestyle  . Physical activity    Days per week: 0 days    Minutes per session: 0 min  . Stress: Not at all  Relationships  . Social Herbalist on phone: Never    Gets together: Never    Attends religious service: Never    Active member of club or organization: No    Attends meetings of clubs or organizations: Never    Relationship status: Widowed  . Intimate partner violence    Fear of current or ex partner: No     Emotionally abused: No    Physically abused: No    Forced sexual activity: No  Other Topics Concern  . Not on file  Social History Narrative  . Not on file   Family History  Adopted: Yes             MEDICATIONS   Medication Sig  . acetaminophen (TYLENOL) 325 MG tablet Take 650 mg by mouth every 6 (six) hours as needed.   Marland Kitchen apixaban (ELIQUIS) 5 MG TABS tablet Take 5 mg by mouth 2 (two) times daily.   . carboxymethylcellulose (REFRESH PLUS) 0.5 % SOLN Place 1 drop into both eyes 4 (four) times daily.  . cholecalciferol (VITAMIN D) 1000 UNITS tablet Take 1,000 Units by mouth daily.  . citalopram (CELEXA) 10 MG tablet Take 10 mg by mouth daily.  Marland Kitchen CRANBERRY FRUIT PO Take by mouth. Give twice a day  . denosumab (PROLIA) 60 MG/ML SOSY injection Inject 60 mg into the skin every 6 (six) months.  . digoxin (LANOXIN) 0.125 MG tablet Take 1 tablet by mouth once a day (HOLD FOR AP UNDER 60)  . furosemide (LASIX) 40 MG tablet Take 40 mg by mouth daily.   . insulin aspart (NOVOLOG FLEXPEN) 100 UNIT/ML FlexPen Inject 10 Units into the skin daily as needed for high blood sugar. Give at 8 AM  . insulin aspart (NOVOLOG) 100 UNIT/ML injection Inject 13 Units into the skin 2 (two) times daily. If BS above 150  . Insulin Glargine (BASAGLAR KWIKPEN) 100 UNIT/ML SOPN Inject 50 Units into the skin at bedtime.   . memantine (NAMENDA) 10 MG tablet Take 10 mg by mouth 2 (two) times daily.  . metolazone (ZAROXOLYN) 2.5 MG tablet Take 5 mg by mouth See admin instructions. Mon and Friday.  2 tabs to = 5 mg  . metoprolol tartrate (LOPRESSOR) 25 MG tablet Take 50 mg by mouth 2 (two) times daily.   . Multiple Vitamin (MULTIVITAMIN WITH MINERALS) TABS Take 1 tablet by mouth every morning.  . NON FORMULARY Diet Type:  NAS, consistent CHO, Dysphagia 3 with thin liquids  Give Cranberry Juice two times daily at 9 am and 5 pm  . omeprazole (PRILOSEC) 40 MG capsule Take 40 mg by mouth daily.  .  polyethylene glycol (MIRALAX / GLYCOLAX) packet Take 17 g by mouth daily as needed.   . potassium chloride (MICRO-K) 10 MEQ CR capsule Give 4 tablets (40 mg) by mouth two times daily  . pravastatin (PRAVACHOL) 20 MG tablet Take 20 mg by mouth daily. Take along with 40 mg to = 60 mg  . pravastatin (PRAVACHOL) 40 MG tablet Take  40 mg by mouth daily. Take along with 20 mg to = 60 mg  . sitaGLIPtin-metformin (JANUMET) 50-500 MG tablet Take 1 tablet by mouth 2 (two) times daily with a meal.  . [DISCONTINUED] Dextromethorphan-guaiFENesin (ROBITUSSIN DM PO) Take 15 mLs by mouth every 6 (six) hours as needed. May give up to 48 hours.  Inform physician immediately if cough or congestion is associated with fever or SOB      Review of systems-this was limited secondary to dementia provided by nursing as well.  In general no complaints of fever or chills.  Skin does not complain of rashes or itching.  Head ears eyes nose mouth and throat does not complain of visual changes sore throat.  Respiratory denies shortness of breath or cough.  Cardiac does not complain of chest pain she does have chronic lower extremity edema.  GI does not complain of abdominal discomfort nausea vomiting diarrhea constipation says she is looking forward to eating supper.  GU is not complain of dysuria.  Musculoskeletal is not complaining of having joint pain currently.  Neurologic does not complain of feeling dizzy or syncopal or having a headache.  And psych does not complain of being depressed or anxious she does have a history of dementia she appears to do well with supportive care   Physical exam.  Temperature is 98.6 pulse 87 respirations 18 blood pressure 106/69.  In general this is a pleasant elderly female in no distress resting comfortably in bed.  Her skin is warm and dry.  Eyes visual acuity appears to be intact sclera and conjunctive are clear.  Oropharynx is clear mucous membranes moist.  Chest  is clear to auscultation could not appreciate any labored breathing respiratory effort is somewhat poor  Heart is regular irregular rate and rhythm without murmur gallop or rub she has chronic lower extremity 1-2+ edema this appears stable.  Abdomen is obese soft nontender with positive bowel sounds.  Musculoskeletal Limited exam since she is in bed but appears able to move all extremities x4 she has chronic tenderness when you touch her legs but this is not new.  Neurologic appears grossly intact her speech is clear.  Psych she is oriented to self she is pleasant cooperative.  Labs.  December 18, 2018.  Hemoglobin A1c was 7.8.  November 17, 2018-digoxin level 0.9.  October 09, 2018.  Sodium 137 potassium 3.5 BUN 29 creatinine 1.06.  WBC 8.3 hemoglobin 11.9 platelets 232.  TSH was 2.608.  Assessment and plan.  1.  History of lower extremity edema-per discussion above apparently there are insurance issues with the metolazone but she has had significant stability with this in addition to the Lasix-this wasdiscussed with nursing and pharmacy will send the medication.  I suspect discontinuing the metolazone may compromise the patient somewhat since she has been so stable on it. Also will update a metabolic panel later this week ensure stability of electrolytes.  2 atrial fibrillation this appears rate controlled she is on digoxin 0.125 mg a day Lopressor 50 mg a day for rate control she is also on Eliquis 5 mg twice daily   #3 chronic kidney disease creatinine shows stability at 1.06 again will have this updated   [id number]

## 2019-04-05 ENCOUNTER — Other Ambulatory Visit (HOSPITAL_COMMUNITY)
Admission: RE | Admit: 2019-04-05 | Discharge: 2019-04-05 | Disposition: A | Discharge status: Home / Self Care | Payer: Medicare Other | Source: Skilled Nursing Facility | Attending: Adult Health | Admitting: Adult Health

## 2019-04-05 DIAGNOSIS — I130 Hypertensive heart and chronic kidney disease with heart failure and stage 1 through stage 4 chronic kidney disease, or unspecified chronic kidney disease: Secondary | ICD-10-CM | POA: Insufficient documentation

## 2019-04-05 LAB — BASIC METABOLIC PANEL
Anion gap: 11 (ref 5–15)
BUN: 34 mg/dL — ABNORMAL HIGH (ref 8–23)
CO2: 26 mmol/L (ref 22–32)
Calcium: 9.4 mg/dL (ref 8.9–10.3)
Chloride: 101 mmol/L (ref 98–111)
Creatinine, Ser: 1.25 mg/dL — ABNORMAL HIGH (ref 0.44–1.00)
GFR calc Af Amer: 47 mL/min — ABNORMAL LOW (ref >60)
GFR calc non Af Amer: 41 mL/min — ABNORMAL LOW (ref >60)
Glucose, Bld: 132 mg/dL — ABNORMAL HIGH (ref 70–99)
Potassium: 4.3 mmol/L (ref 3.5–5.1)
Sodium: 138 mmol/L (ref 135–145)

## 2019-04-07 ENCOUNTER — Other Ambulatory Visit: Payer: Self-pay | Admitting: Internal Medicine

## 2019-04-07 ENCOUNTER — Other Ambulatory Visit (HOSPITAL_COMMUNITY)
Admission: RE | Admit: 2019-04-07 | Discharge: 2019-04-07 | Disposition: A | Discharge status: Home / Self Care | Payer: Medicare Other | Source: Ambulatory Visit | Attending: Internal Medicine | Admitting: Internal Medicine

## 2019-04-07 DIAGNOSIS — Z20828 Contact with and (suspected) exposure to other viral communicable diseases: Secondary | ICD-10-CM | POA: Insufficient documentation

## 2019-04-07 DIAGNOSIS — Z9189 Other specified personal risk factors, not elsewhere classified: Secondary | ICD-10-CM

## 2019-04-09 LAB — SARS CORONAVIRUS 2 (TAT 6-24 HRS): SARS Coronavirus 2: NEGATIVE

## 2019-04-13 ENCOUNTER — Other Ambulatory Visit (HOSPITAL_COMMUNITY)
Admission: RE | Admit: 2019-04-13 | Discharge: 2019-04-13 | Disposition: A | Discharge status: Home / Self Care | Payer: Medicare Other | Source: Ambulatory Visit | Attending: Internal Medicine | Admitting: Internal Medicine

## 2019-04-13 ENCOUNTER — Other Ambulatory Visit: Payer: Self-pay | Admitting: Internal Medicine

## 2019-04-13 DIAGNOSIS — Z20828 Contact with and (suspected) exposure to other viral communicable diseases: Secondary | ICD-10-CM | POA: Diagnosis present

## 2019-04-13 DIAGNOSIS — Z9189 Other specified personal risk factors, not elsewhere classified: Secondary | ICD-10-CM

## 2019-04-14 LAB — SARS CORONAVIRUS 2 (TAT 6-24 HRS): SARS Coronavirus 2: NEGATIVE

## 2019-04-17 DIAGNOSIS — Z20828 Contact with and (suspected) exposure to other viral communicable diseases: Secondary | ICD-10-CM | POA: Diagnosis present

## 2019-04-18 LAB — NOVEL CORONAVIRUS, NAA (HOSP ORDER, SEND-OUT TO REF LAB; TAT 18-24 HRS): SARS-CoV-2, NAA: NOT DETECTED

## 2019-04-22 ENCOUNTER — Other Ambulatory Visit (HOSPITAL_COMMUNITY)
Admission: RE | Admit: 2019-04-22 | Discharge: 2019-04-22 | Disposition: A | Discharge status: Home / Self Care | Payer: Medicare Other | Source: Ambulatory Visit | Attending: Internal Medicine | Admitting: Internal Medicine

## 2019-04-22 DIAGNOSIS — Z20828 Contact with and (suspected) exposure to other viral communicable diseases: Secondary | ICD-10-CM | POA: Insufficient documentation

## 2019-04-23 ENCOUNTER — Encounter (HOSPITAL_COMMUNITY)
Admission: RE | Admit: 2019-04-23 | Discharge: 2019-04-23 | Disposition: A | Discharge status: Home / Self Care | Payer: Medicare Other | Source: Skilled Nursing Facility | Attending: Adult Health | Admitting: Adult Health

## 2019-04-23 ENCOUNTER — Encounter: Payer: Self-pay | Admitting: Adult Health

## 2019-04-23 ENCOUNTER — Non-Acute Institutional Stay (SKILLED_NURSING_FACILITY): Payer: Medicare Other | Admitting: Adult Health

## 2019-04-23 DIAGNOSIS — F0391 Unspecified dementia with behavioral disturbance: Secondary | ICD-10-CM | POA: Diagnosis not present

## 2019-04-23 DIAGNOSIS — E1122 Type 2 diabetes mellitus with diabetic chronic kidney disease: Secondary | ICD-10-CM | POA: Diagnosis not present

## 2019-04-23 DIAGNOSIS — I129 Hypertensive chronic kidney disease with stage 1 through stage 4 chronic kidney disease, or unspecified chronic kidney disease: Secondary | ICD-10-CM | POA: Diagnosis not present

## 2019-04-23 DIAGNOSIS — N182 Chronic kidney disease, stage 2 (mild): Secondary | ICD-10-CM

## 2019-04-23 DIAGNOSIS — I10 Essential (primary) hypertension: Secondary | ICD-10-CM | POA: Insufficient documentation

## 2019-04-23 DIAGNOSIS — E118 Type 2 diabetes mellitus with unspecified complications: Secondary | ICD-10-CM

## 2019-04-23 DIAGNOSIS — Z794 Long term (current) use of insulin: Secondary | ICD-10-CM

## 2019-04-23 LAB — COMPREHENSIVE METABOLIC PANEL
ALT: 19 U/L (ref 0–44)
AST: 21 U/L (ref 15–41)
Albumin: 3.2 g/dL — ABNORMAL LOW (ref 3.5–5.0)
Alkaline Phosphatase: 60 U/L (ref 38–126)
Anion gap: 14 (ref 5–15)
BUN: 37 mg/dL — ABNORMAL HIGH (ref 8–23)
CO2: 28 mmol/L (ref 22–32)
Calcium: 9.3 mg/dL (ref 8.9–10.3)
Chloride: 97 mmol/L — ABNORMAL LOW (ref 98–111)
Creatinine, Ser: 1.36 mg/dL — ABNORMAL HIGH (ref 0.44–1.00)
GFR calc Af Amer: 43 mL/min — ABNORMAL LOW (ref >60)
GFR calc non Af Amer: 37 mL/min — ABNORMAL LOW (ref >60)
Glucose, Bld: 187 mg/dL — ABNORMAL HIGH (ref 70–99)
Potassium: 3.3 mmol/L — ABNORMAL LOW (ref 3.5–5.1)
Sodium: 139 mmol/L (ref 135–145)
Total Bilirubin: 0.5 mg/dL (ref 0.3–1.2)
Total Protein: 7.4 g/dL (ref 6.5–8.1)

## 2019-04-23 LAB — CBC WITH DIFFERENTIAL/PLATELET
Abs Immature Granulocytes: 0.02 10*3/uL (ref 0.00–0.07)
Basophils Absolute: 0 10*3/uL (ref 0.0–0.1)
Basophils Relative: 0 %
Eosinophils Absolute: 0.1 10*3/uL (ref 0.0–0.5)
Eosinophils Relative: 1 %
HCT: 41.4 % (ref 36.0–46.0)
Hemoglobin: 13 g/dL (ref 12.0–15.0)
Immature Granulocytes: 0 %
Lymphocytes Relative: 24 %
Lymphs Abs: 2.3 10*3/uL (ref 0.7–4.0)
MCH: 28.4 pg (ref 26.0–34.0)
MCHC: 31.4 g/dL (ref 30.0–36.0)
MCV: 90.6 fL (ref 80.0–100.0)
Monocytes Absolute: 0.7 10*3/uL (ref 0.1–1.0)
Monocytes Relative: 7 %
Neutro Abs: 6.4 10*3/uL (ref 1.7–7.7)
Neutrophils Relative %: 68 %
Platelets: 280 10*3/uL (ref 150–400)
RBC: 4.57 MIL/uL (ref 3.87–5.11)
RDW: 13.7 % (ref 11.5–15.5)
WBC: 9.6 10*3/uL (ref 4.0–10.5)
nRBC: 0 % (ref 0.0–0.2)

## 2019-04-23 NOTE — Progress Notes (Signed)
Location:  [facility] Room Number: 113-W Place of Service:  SNF (31)   CODE STATUS:  DNR  Allergies  Allergen Reactions  . Codeine Anaphylaxis and Hives  . Morphine And Related Anaphylaxis and Hives  . Penicillins Anaphylaxis  . Ace Inhibitors Cough    Chief Complaint  Patient presents with  . Acute Visit    Patient is seen for COVID vaccination    HPI:  We have the mederna vaccine. Her risk factors include: age; long term resident of snf; diabetes; hypertension; dementia. I have discussed with the family the 2 injection process; effectiveness; side effects; possible adverse reactions. At this time they want to discuss the vaccine before making a decision. They have no questions at this time and verbalized understanding of information given to them. There are no reports of cough; shortness of breath; no reports of changes in appetite; no uncontrolled pain.    Past Medical History:  Diagnosis Date  . Allergy   . Atrial fibrillation ([place]) 08/2011   First diagnosed in 08/2011; duration of arrhythmia is uncertain  . Bilateral lower extremity edema   . Chronic diarrhea    diverticulosis  . COPD (chronic obstructive pulmonary disease) ([place])   . Dementia ([place])   . Diabetes mellitus, type 2 (HCC)    Diabetic neuropathy  . Dyspnea on exertion    pedal edema  . Gout   . Headache(784.0)    twice weekly  . Hyperlipidemia   . Hypertension   . Osteopenia    DEXA scan 01/2010  . Palpitations   . Seasonal allergies   . Stress incontinence   . Vertigo     Past Surgical History:  Procedure Laterality Date  . APPENDECTOMY    . BREAST BIOPSY  2002   Right  . CESAREAN SECTION     x 2  . CHOLECYSTECTOMY    . COLONOSCOPY  2007   Negative screening study  . COLONOSCOPY WITH PROPOFOL N/A 09/18/2017   Procedure: COLONOSCOPY WITH PROPOFOL;  Surgeon: Daneil Dolin, MD;  [facility];  Service: Endoscopy;  Laterality: N/A;  11:00am  . HAMMER TOE  SURGERY     Bilateral hammer toe amputation  . INCISIONAL HERNIA REPAIR    . KNEE ARTHROSCOPY W/ MENISCAL REPAIR  2007   Bilateral  . POLYPECTOMY  09/18/2017   Procedure: POLYPECTOMY;  Surgeon: Daneil Dolin, MD;  [facility];  Service: Endoscopy;;  colon  . UMBILICAL HERNIA REPAIR      Social History   Socioeconomic History  . Marital status: Widowed    Spouse name: Not on file  . Number of children: 2  . Years of education: Not on file  . Highest education level: Not on file  Occupational History  . Occupation: Engineer, materials  Tobacco Use  . Smoking status: Never Smoker  . Smokeless tobacco: Never Used  Substance and Sexual Activity  . Alcohol use: No  . Drug use: No  . Sexual activity: Not on file  Other Topics Concern  . Not on file  Social History Narrative  . Not on file   Social Determinants of Health   Financial Resource Strain:   . Difficulty of Paying Living Expenses: Not on file  Food Insecurity:   . Worried About Charity fundraiser in the Last Year: Not on file  . Ran Out of Food in the Last Year: Not on file  Transportation Needs:   . Lack of  Transportation (Medical): Not on file  . Lack of Transportation (Non-Medical): Not on file  Physical Activity:   . Days of Exercise per Week: Not on file  . Minutes of Exercise per Session: Not on file  Stress:   . Feeling of Stress : Not on file  Social Connections:   . Frequency of Communication with Friends and Family: Not on file  . Frequency of Social Gatherings with Friends and Family: Not on file  . Attends Religious Services: Not on file  . Active Member of Clubs or Organizations: Not on file  . Attends Archivist Meetings: Not on file  . Marital Status: Not on file  Intimate Partner Violence:   . Fear of Current or Ex-Partner: Not on file  . Emotionally Abused: Not on file  . Physically Abused: Not on file  . Sexually Abused: Not on file   Family History  Adopted: Yes        VITAL SIGNS BP 126/81   Pulse 76   Temp 98.6 F (37 C) (Oral)   Resp 20   Ht 5\' 6"  (1.676 m)   Wt 183 lb 12.8 oz (83.4 kg)   SpO2 98%   BMI 29.67 kg/m   Outpatient Encounter Medications as of 04/23/2019  Medication Sig  . acetaminophen (TYLENOL) 325 MG tablet Take 650 mg by mouth every 6 (six) hours as needed.   Marland Kitchen apixaban (ELIQUIS) 5 MG TABS tablet Take 5 mg by mouth 2 (two) times daily.   . carboxymethylcellulose (REFRESH PLUS) 0.5 % SOLN Place 1 drop into both eyes 4 (four) times daily.  . cholecalciferol (VITAMIN D) 1000 UNITS tablet Take 1,000 Units by mouth daily.  Marland Kitchen CRANBERRY FRUIT PO Take by mouth. Give twice a day  . denosumab (PROLIA) 60 MG/ML SOSY injection Inject 60 mg into the skin every 6 (six) months.  . digoxin (LANOXIN) 0.125 MG tablet Take 1 tablet by mouth once a day (HOLD FOR AP UNDER 60)  . furosemide (LASIX) 40 MG tablet Take 40 mg by mouth daily.   . insulin aspart (NOVOLOG FLEXPEN) 100 UNIT/ML FlexPen Inject 10 Units into the skin daily as needed for high blood sugar. Give at 8 AM  . insulin aspart (NOVOLOG) 100 UNIT/ML injection Inject 13 Units into the skin 2 (two) times daily. If BS above 150  . Insulin Glargine (BASAGLAR KWIKPEN) 100 UNIT/ML SOPN Inject 50 Units into the skin at bedtime.   . memantine (NAMENDA) 10 MG tablet Take 10 mg by mouth 2 (two) times daily.  . metolazone (ZAROXOLYN) 2.5 MG tablet Take 5 mg by mouth See admin instructions. Mon and Friday.  2 tabs to = 5 mg  . metoprolol tartrate (LOPRESSOR) 25 MG tablet Take 50 mg by mouth 2 (two) times daily.   . Multiple Vitamin (MULTIVITAMIN WITH MINERALS) TABS Take 1 tablet by mouth every morning.  . NON FORMULARY Diet Type:  NAS, consistent CHO, Dysphagia 3 with thin liquids  Give Cranberry Juice two times daily at 9 am and 5 pm  . omeprazole (PRILOSEC) 40 MG capsule Take 40 mg by mouth daily.  . polyethylene glycol (MIRALAX / GLYCOLAX) packet Take 17 g by mouth daily as needed.   .  potassium chloride (MICRO-K) 10 MEQ CR capsule Give 4 tablets (40 mg) by mouth two times daily  . pravastatin (PRAVACHOL) 20 MG tablet Take 20 mg by mouth daily. Take along with 40 mg to = 60 mg  . pravastatin (PRAVACHOL) 40 MG  tablet Take 40 mg by mouth daily. Take along with 20 mg to = 60 mg  . sitaGLIPtin-metformin (JANUMET) 50-500 MG tablet Take 1 tablet by mouth 2 (two) times daily with a meal.  . [DISCONTINUED] citalopram (CELEXA) 10 MG tablet Take 10 mg by mouth daily.   No facility-administered encounter medications on file as of 04/23/2019.     SIGNIFICANT DIAGNOSTIC EXAMS  PREVIOUS  02-21-19: DEXA: t score -3.504  NO NEW EXAMS.    LABS REVIEWED PREVIOUS:   04-26-18: wbc 7.9; hgb 11.7; hct 38.0; mcv 90.9; plt 241; glucose 147; bun 24; creat 1.03 ;k+ 3.6; na++ 138; ca 9.3  05-23-18: hgb a1c 8.0  08-09-18: wbc 10.3 hgb 12.1; hct 39.2 mcv 92.2 ;plt 242; glucose 86 bun 31; creat 1.08; k+ 3.2; na++ 141; ca 9.3; chol 148; ldl 95; trig 107; hdl 32 hgb a1c 7.9 08-23-18: glucose 174; bun 30; creat 0.99; k+ 3.6; na++ 142; ca 9.1  10-09-18: wbc 8.3; hgb 11.9; hct 38.2; mcv 91.6; plt 232; gluocse 203; bun 29; creat 1.06; k+ 3.5;na++ 137; ca 8.2; tsh 2.608; dig 1.0   11-14-18: dig 0.9 12-18-18: hgb a1c 7.8   01-24-19: urine micro-albumin: 5.3  TODAY  04-23-19: wbc 9.6; hgb 13.0; hct 41.4; mcv 90. 6 plt 280; glucose 187; bun 37; creat 1.36; k+ 3.3; na++ 139; ca 9.3 liver normal albumin 3.3   Review of Systems  Unable to perform ROS: Dementia (unable to participate )   Physical Exam Constitutional:      General: She is not in acute distress.    Appearance: She is well-developed. She is not diaphoretic.  Neck:     Thyroid: No thyromegaly.  Cardiovascular:     Rate and Rhythm: Normal rate. Rhythm irregular.     Pulses: Normal pulses.     Heart sounds: Normal heart sounds.  Pulmonary:     Effort: Pulmonary effort is normal. No respiratory distress.     Breath sounds: Normal breath  sounds.  Abdominal:     General: Bowel sounds are normal. There is no distension.     Palpations: Abdomen is soft.     Tenderness: There is no abdominal tenderness.  Musculoskeletal:        General: Normal range of motion.     Cervical back: Neck supple.     Right lower leg: No edema.     Left lower leg: No edema.  Lymphadenopathy:     Cervical: No cervical adenopathy.  Skin:    General: Skin is warm and dry.  Neurological:     Mental Status: She is alert. Mental status is at baseline.  Psychiatric:        Mood and Affect: Mood normal.       ASSESSMENT/ PLAN:  TODAY  1. Advanced age 77. Long term resident of snf 3. Hypertension associated with stage 2 chronic kidney disease and diabetes mellitus type 2 4. Controlled type 2 diabetes mellitus with complication with long term current use of insulin 5. 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  6. Hypokalemia  Will increase k+ to 40 meq three times daily will check k+ on 05-01-19  On 04-29-19: did agree to injection: will setup for 04-30-19    MD is aware of resident's narcotic use and is in agreement with current plan of care. We will attempt to wean resident as appropriate.  Ok Edwards NP [facility]  Contact [phone] Monday through Friday 8am- 5pm  After hours call [phone]

## 2019-04-24 ENCOUNTER — Encounter: Payer: Self-pay | Admitting: Internal Medicine

## 2019-04-24 ENCOUNTER — Non-Acute Institutional Stay (SKILLED_NURSING_FACILITY): Payer: Medicare Other | Admitting: Internal Medicine

## 2019-04-24 DIAGNOSIS — F0391 Unspecified dementia with behavioral disturbance: Secondary | ICD-10-CM

## 2019-04-24 DIAGNOSIS — R600 Localized edema: Secondary | ICD-10-CM | POA: Diagnosis not present

## 2019-04-24 DIAGNOSIS — M810 Age-related osteoporosis without current pathological fracture: Secondary | ICD-10-CM

## 2019-04-24 LAB — NOVEL CORONAVIRUS, NAA (HOSP ORDER, SEND-OUT TO REF LAB; TAT 18-24 HRS): SARS-CoV-2, NAA: NOT DETECTED

## 2019-04-24 NOTE — Progress Notes (Signed)
Location:  [facility] Room Number: 113-W Place of Service:  SNF (31)  Christine Duos, MD  Patient Care Team: Christine Duos, MD as PCP - General (Internal Medicine) Rothbart, Cristopher Estimable, MD (Cardiology) [facility] [place] ([place]) Gerlene Fee, NP as Nurse Practitioner (Geriatric Medicine)  Extended Emergency Contact Information Primary Emergency Contact: [facility] Address: [address]          [place], [place] [address] [name] of [place] Phone: [phone] Mobile Phone: [phone] Relation: Daughter    Allergies: Codeine, Morphine and related, Penicillins, and Ace inhibitors  Chief Complaint  Patient presents with  . Medical Management of Chronic Issues    Routine [place] visit    HPI: Patient is a 78 y.o. female who is being seen for routine issues of dementia, osteoporosis, and bilateral leg edema.  Past Medical History:  Diagnosis Date  . Allergy   . Atrial fibrillation ([place]) 08/2011   First diagnosed in 08/2011; duration of arrhythmia is uncertain  . Bilateral lower extremity edema   . Chronic diarrhea    diverticulosis  . COPD (chronic obstructive pulmonary disease) ([place])   . Dementia ([place])   . Diabetes mellitus, type 2 (HCC)    Diabetic neuropathy  . Dyspnea on exertion    pedal edema  . Gout   . Headache(784.0)    twice weekly  . Hyperlipidemia   . Hypertension   . Osteopenia    DEXA scan 01/2010  . Palpitations   . Seasonal allergies   . Stress incontinence   . Vertigo     Past Surgical History:  Procedure Laterality Date  . APPENDECTOMY    . BREAST BIOPSY  2002   Right  . CESAREAN SECTION     x 2  . CHOLECYSTECTOMY    . COLONOSCOPY  2007   Negative screening study  . COLONOSCOPY WITH PROPOFOL N/A 09/18/2017   Procedure: COLONOSCOPY WITH PROPOFOL;  Surgeon: Daneil Dolin, MD;  [facility];  Service: Endoscopy;  Laterality: N/A;  11:00am  .  HAMMER TOE SURGERY     Bilateral hammer toe amputation  . INCISIONAL HERNIA REPAIR    . KNEE ARTHROSCOPY W/ MENISCAL REPAIR  2007   Bilateral  . POLYPECTOMY  09/18/2017   Procedure: POLYPECTOMY;  Surgeon: Daneil Dolin, MD;  [facility];  Service: Endoscopy;;  colon  . UMBILICAL HERNIA REPAIR      Allergies as of 04/24/2019      Reactions   Codeine Anaphylaxis, Hives   Morphine And Related Anaphylaxis, Hives   Penicillins Anaphylaxis   Ace Inhibitors Cough      Medication List    Notice   This visit is during an admission. Changes to the med list made in this visit will be reflected in the After Visit Summary of the admission.     No orders of the defined types were placed in this encounter.   Immunization History  Administered Date(s) Administered  . Influenza-Unspecified 02/03/2017, 02/01/2018, 02/04/2019  . Pneumococcal Conjugate-13 02/03/2017  . Pneumococcal-Unspecified 02/08/2016  . Tdap 01/19/2017  . Zoster 08/02/2018    Social History   Tobacco Use  . Smoking status: Never Smoker  . Smokeless tobacco: Never Used  Substance Use Topics  . Alcohol use: No    Review of Systems   GENERAL:  no fevers, fatigue, appetite changes SKIN: No itching, rash HEENT: No complaint RESPIRATORY: No cough, wheezing, SOB CARDIAC: No  chest pain, palpitations, lower extremity edema  GI: No abdominal pain, No N/V/D or constipation, No heartburn or reflux  GU: No dysuria, frequency or urgency, or incontinence  MUSCULOSKELETAL: No unrelieved bone/joint pain NEUROLOGIC: No headache, dizziness  PSYCHIATRIC: No overt anxiety or sadness  Vitals:   04/24/19 0912  BP: 126/81  Pulse: 76  Resp: 20  Temp: 98.6 F (37 C)  SpO2: 98%   Body mass index is 29.67 kg/m. Physical Exam  GENERAL APPEARANCE: Alert, conversant, No acute distress  SKIN: No diaphoresis rash HEENT: Unremarkable RESPIRATORY: Breathing is even, unlabored. Lung sounds are clear     CARDIOVASCULAR: Heart RRR no murmurs, rubs or gallops.  TED hose, no peripheral edema  GASTROINTESTINAL: Abdomen is soft, non-tender, not distended w/ normal bowel sounds.  GENITOURINARY: Bladder non tender, not distended  MUSCULOSKELETAL: No abnormal joints or musculature NEUROLOGIC: Cranial nerves 2-12 grossly intact. Moves all extremities PSYCHIATRIC: Confused with dementia, no behavioral issues  Patient Active Problem List   Diagnosis Date Noted  . Controlled diabetes mellitus type 2 with complications ([employer]) XX123456  . Bradycardia 11/14/2018  . Hypertension associated with stage 2 chronic kidney disease due to type 2 diabetes mellitus ([place]) 09/27/2018  . Hypokalemia 07/02/2018  . GERD without esophagitis 07/02/2018  . Dyslipidemia associated with type 2 diabetes mellitus ([place]) 07/02/2018  . CKD stage 2 due to type 2 diabetes mellitus ([place]) 07/02/2018  . Major depression, recurrent, chronic ([place]) 07/02/2018  . Anemia 09/14/2017  . Bilateral leg edema 01/10/2017  . Osteoporosis 08/09/2016  . Diabetic neuropathy ([place]) 09/29/2015  . Dementia with behavioral disturbance ([place]) 02/24/2015  . Chronic atrial fibrillation ([place])   . Chronic anticoagulation 09/21/2011  . Hypertension   . Type 2 diabetes, uncontrolled, with peripheral circulatory disorder (HCC)     CMP     Component Value Date/Time   NA 139 04/23/2019 1227   NA 139 06/30/2015 1712   K 4.3 05/01/2019 0415   CL 97 (L) 04/23/2019 1227   CO2 28 04/23/2019 1227   GLUCOSE 187 (H) 04/23/2019 1227   BUN 37 (H) 04/23/2019 1227   BUN 15 06/30/2015 1712   CREATININE 1.36 (H) 04/23/2019 1227   CREATININE 0.90 07/04/2012 1326   CALCIUM 9.3 04/23/2019 1227   PROT 7.4 04/23/2019 1227   PROT 7.3 06/30/2015 1712   ALBUMIN 3.2 (L) 04/23/2019 1227   ALBUMIN 3.5 06/30/2015 1712   AST 21 04/23/2019 1227   ALT 19 04/23/2019 1227   ALKPHOS 60 04/23/2019 1227   BILITOT 0.5 04/23/2019 1227   BILITOT 0.2 06/30/2015 1712    GFRNONAA 37 (L) 04/23/2019 1227   GFRAA 43 (L) 04/23/2019 1227   Recent Labs    10/09/18 0702 04/05/19 0405 04/23/19 1227 05/01/19 0415  NA 137 138 139  --   K 3.5 4.3 3.3* 4.3  CL 99 101 97*  --   CO2 29 26 28   --   GLUCOSE 203* 132* 187*  --   BUN 29* 34* 37*  --   CREATININE 1.06* 1.25* 1.36*  --   CALCIUM 9.2 9.4 9.3  --    Recent Labs    04/23/19 1227  AST 21  ALT 19  ALKPHOS 60  BILITOT 0.5  PROT 7.4  ALBUMIN 3.2*   Recent Labs    08/09/18 0318 10/09/18 0702 04/23/19 1227  WBC 10.3 8.3 9.6  NEUTROABS 6.4 5.3 6.4  HGB 12.1 11.9* 13.0  HCT 39.2 38.2 41.4  MCV 92.2 91.6 90.6  PLT 242  232 280   Recent Labs    08/09/18 0318  CHOL 148  LDLCALC 95  TRIG 107   Lab Results  Component Value Date   MICROALBUR 5.3 (H) 01/24/2019   Lab Results  Component Value Date   TSH 2.608 10/09/2018   Lab Results  Component Value Date   HGBA1C 7.8 (H) 12/18/2018   Lab Results  Component Value Date   CHOL 148 08/09/2018   HDL 32 (L) 08/09/2018   LDLCALC 95 08/09/2018   TRIG 107 08/09/2018   CHOLHDL 4.6 08/09/2018    Significant Diagnostic Results in last 30 days:  No results found.  Assessment and Plan  Dementia with behavioral disturbance Chronic stable; continue Namenda 10 mg twice daily  Osteoporosis Without reported fracture; continue Prolia 60 mg subcu every 6 months  Bilateral leg edema Patient is wearing TED hose and edema not currently present; continue TED hose as well as Lasix 40 mg daily     Christine Duos, MD

## 2019-04-28 ENCOUNTER — Other Ambulatory Visit (HOSPITAL_COMMUNITY)
Admission: RE | Admit: 2019-04-28 | Discharge: 2019-04-28 | Disposition: A | Discharge status: Home / Self Care | Payer: Medicare Other | Source: Ambulatory Visit | Attending: Adult Health | Admitting: Adult Health

## 2019-04-28 DIAGNOSIS — Z20828 Contact with and (suspected) exposure to other viral communicable diseases: Secondary | ICD-10-CM | POA: Diagnosis present

## 2019-04-29 LAB — NOVEL CORONAVIRUS, NAA (HOSP ORDER, SEND-OUT TO REF LAB; TAT 18-24 HRS): SARS-CoV-2, NAA: NOT DETECTED

## 2019-05-01 ENCOUNTER — Other Ambulatory Visit (HOSPITAL_COMMUNITY)
Admission: RE | Admit: 2019-05-01 | Discharge: 2019-05-01 | Disposition: A | Discharge status: Home / Self Care | Payer: Medicare Other | Source: Ambulatory Visit | Attending: Adult Health | Admitting: Adult Health

## 2019-05-01 DIAGNOSIS — M810 Age-related osteoporosis without current pathological fracture: Secondary | ICD-10-CM | POA: Diagnosis not present

## 2019-05-01 DIAGNOSIS — R41841 Cognitive communication deficit: Secondary | ICD-10-CM | POA: Diagnosis not present

## 2019-05-01 DIAGNOSIS — E876 Hypokalemia: Secondary | ICD-10-CM | POA: Insufficient documentation

## 2019-05-01 DIAGNOSIS — F0390 Unspecified dementia without behavioral disturbance: Secondary | ICD-10-CM | POA: Diagnosis not present

## 2019-05-01 DIAGNOSIS — M6281 Muscle weakness (generalized): Secondary | ICD-10-CM | POA: Diagnosis not present

## 2019-05-01 DIAGNOSIS — F329 Major depressive disorder, single episode, unspecified: Secondary | ICD-10-CM | POA: Diagnosis not present

## 2019-05-01 DIAGNOSIS — Z741 Need for assistance with personal care: Secondary | ICD-10-CM | POA: Diagnosis not present

## 2019-05-01 LAB — POTASSIUM: Potassium: 4.3 mmol/L (ref 3.5–5.1)

## 2019-05-02 ENCOUNTER — Encounter: Payer: Self-pay | Admitting: Internal Medicine

## 2019-05-02 DIAGNOSIS — F329 Major depressive disorder, single episode, unspecified: Secondary | ICD-10-CM | POA: Diagnosis not present

## 2019-05-02 DIAGNOSIS — Z741 Need for assistance with personal care: Secondary | ICD-10-CM | POA: Diagnosis not present

## 2019-05-02 DIAGNOSIS — R41841 Cognitive communication deficit: Secondary | ICD-10-CM | POA: Diagnosis not present

## 2019-05-02 DIAGNOSIS — F0390 Unspecified dementia without behavioral disturbance: Secondary | ICD-10-CM | POA: Diagnosis not present

## 2019-05-02 DIAGNOSIS — M6281 Muscle weakness (generalized): Secondary | ICD-10-CM | POA: Diagnosis not present

## 2019-05-02 DIAGNOSIS — M810 Age-related osteoporosis without current pathological fracture: Secondary | ICD-10-CM | POA: Diagnosis not present

## 2019-05-02 NOTE — Assessment & Plan Note (Signed)
Without reported fracture; continue Prolia 60 mg subcu every 6 months

## 2019-05-02 NOTE — Assessment & Plan Note (Signed)
Chronic stable; continue Namenda 10 mg twice daily

## 2019-05-02 NOTE — Assessment & Plan Note (Signed)
Patient is wearing TED hose and edema not currently present; continue TED hose as well as Lasix 40 mg daily

## 2019-05-03 DIAGNOSIS — F0390 Unspecified dementia without behavioral disturbance: Secondary | ICD-10-CM | POA: Diagnosis not present

## 2019-05-03 DIAGNOSIS — M810 Age-related osteoporosis without current pathological fracture: Secondary | ICD-10-CM | POA: Diagnosis not present

## 2019-05-03 DIAGNOSIS — F329 Major depressive disorder, single episode, unspecified: Secondary | ICD-10-CM | POA: Diagnosis not present

## 2019-05-03 DIAGNOSIS — R41841 Cognitive communication deficit: Secondary | ICD-10-CM | POA: Diagnosis not present

## 2019-05-03 DIAGNOSIS — M6281 Muscle weakness (generalized): Secondary | ICD-10-CM | POA: Diagnosis not present

## 2019-05-03 DIAGNOSIS — Z741 Need for assistance with personal care: Secondary | ICD-10-CM | POA: Diagnosis not present

## 2019-05-06 DIAGNOSIS — Z741 Need for assistance with personal care: Secondary | ICD-10-CM | POA: Diagnosis not present

## 2019-05-06 DIAGNOSIS — M810 Age-related osteoporosis without current pathological fracture: Secondary | ICD-10-CM | POA: Diagnosis not present

## 2019-05-06 DIAGNOSIS — R41841 Cognitive communication deficit: Secondary | ICD-10-CM | POA: Diagnosis not present

## 2019-05-06 DIAGNOSIS — M6281 Muscle weakness (generalized): Secondary | ICD-10-CM | POA: Diagnosis not present

## 2019-05-06 DIAGNOSIS — F0390 Unspecified dementia without behavioral disturbance: Secondary | ICD-10-CM | POA: Diagnosis not present

## 2019-05-06 DIAGNOSIS — F329 Major depressive disorder, single episode, unspecified: Secondary | ICD-10-CM | POA: Diagnosis not present

## 2019-05-07 DIAGNOSIS — F329 Major depressive disorder, single episode, unspecified: Secondary | ICD-10-CM | POA: Diagnosis not present

## 2019-05-07 DIAGNOSIS — M810 Age-related osteoporosis without current pathological fracture: Secondary | ICD-10-CM | POA: Diagnosis not present

## 2019-05-07 DIAGNOSIS — Z741 Need for assistance with personal care: Secondary | ICD-10-CM | POA: Diagnosis not present

## 2019-05-07 DIAGNOSIS — R41841 Cognitive communication deficit: Secondary | ICD-10-CM | POA: Diagnosis not present

## 2019-05-07 DIAGNOSIS — M6281 Muscle weakness (generalized): Secondary | ICD-10-CM | POA: Diagnosis not present

## 2019-05-07 DIAGNOSIS — F0390 Unspecified dementia without behavioral disturbance: Secondary | ICD-10-CM | POA: Diagnosis not present

## 2019-05-08 DIAGNOSIS — I5042 Chronic combined systolic (congestive) and diastolic (congestive) heart failure: Secondary | ICD-10-CM | POA: Diagnosis not present

## 2019-05-08 DIAGNOSIS — R41841 Cognitive communication deficit: Secondary | ICD-10-CM | POA: Diagnosis not present

## 2019-05-08 DIAGNOSIS — E1140 Type 2 diabetes mellitus with diabetic neuropathy, unspecified: Secondary | ICD-10-CM | POA: Diagnosis not present

## 2019-05-08 DIAGNOSIS — M6281 Muscle weakness (generalized): Secondary | ICD-10-CM | POA: Diagnosis not present

## 2019-05-08 DIAGNOSIS — F0390 Unspecified dementia without behavioral disturbance: Secondary | ICD-10-CM | POA: Diagnosis not present

## 2019-05-08 DIAGNOSIS — Z1159 Encounter for screening for other viral diseases: Secondary | ICD-10-CM | POA: Diagnosis not present

## 2019-05-08 DIAGNOSIS — F329 Major depressive disorder, single episode, unspecified: Secondary | ICD-10-CM | POA: Diagnosis not present

## 2019-05-08 DIAGNOSIS — Z23 Encounter for immunization: Secondary | ICD-10-CM | POA: Diagnosis not present

## 2019-05-08 DIAGNOSIS — Z741 Need for assistance with personal care: Secondary | ICD-10-CM | POA: Diagnosis not present

## 2019-05-08 DIAGNOSIS — M810 Age-related osteoporosis without current pathological fracture: Secondary | ICD-10-CM | POA: Diagnosis not present

## 2019-05-09 ENCOUNTER — Encounter: Payer: Self-pay | Admitting: Adult Health

## 2019-05-09 ENCOUNTER — Other Ambulatory Visit (HOSPITAL_COMMUNITY)
Admission: RE | Admit: 2019-05-09 | Discharge: 2019-05-09 | Disposition: A | Discharge status: Home / Self Care | Payer: Medicare Other | Source: Skilled Nursing Facility | Attending: Adult Health | Admitting: Adult Health

## 2019-05-09 ENCOUNTER — Non-Acute Institutional Stay (SKILLED_NURSING_FACILITY): Payer: Medicare Other | Admitting: Adult Health

## 2019-05-09 DIAGNOSIS — Z741 Need for assistance with personal care: Secondary | ICD-10-CM | POA: Diagnosis not present

## 2019-05-09 DIAGNOSIS — M6281 Muscle weakness (generalized): Secondary | ICD-10-CM | POA: Diagnosis not present

## 2019-05-09 DIAGNOSIS — R41841 Cognitive communication deficit: Secondary | ICD-10-CM | POA: Diagnosis not present

## 2019-05-09 DIAGNOSIS — E876 Hypokalemia: Secondary | ICD-10-CM | POA: Insufficient documentation

## 2019-05-09 DIAGNOSIS — F329 Major depressive disorder, single episode, unspecified: Secondary | ICD-10-CM | POA: Diagnosis not present

## 2019-05-09 DIAGNOSIS — G301 Alzheimer's disease with late onset: Secondary | ICD-10-CM | POA: Diagnosis not present

## 2019-05-09 DIAGNOSIS — F331 Major depressive disorder, recurrent, moderate: Secondary | ICD-10-CM | POA: Diagnosis not present

## 2019-05-09 DIAGNOSIS — M810 Age-related osteoporosis without current pathological fracture: Secondary | ICD-10-CM | POA: Diagnosis not present

## 2019-05-09 DIAGNOSIS — F0390 Unspecified dementia without behavioral disturbance: Secondary | ICD-10-CM | POA: Diagnosis not present

## 2019-05-09 LAB — VITAMIN B12: Vitamin B-12: 480 pg/mL (ref 180–914)

## 2019-05-09 LAB — POTASSIUM: Potassium: 3.3 mmol/L — ABNORMAL LOW (ref 3.5–5.1)

## 2019-05-09 NOTE — Progress Notes (Signed)
Location:    [facility] Room Number: 113/W Place of Service:  SNF (31)   CODE STATUS: DNR  Allergies  Allergen Reactions  . Codeine Anaphylaxis and Hives  . Morphine And Related Anaphylaxis and Hives  . Penicillins Anaphylaxis  . Ace Inhibitors Cough    Chief Complaint  Patient presents with  . Follow-up    Lab Follow Up    HPI:  Her k+ is 3.3 is presently taking 40 meq twice daily. There are no reports of uncontrolled pain; no changes in appetite; no reports of anxiety.    Past Medical History:  Diagnosis Date  . Allergy   . Atrial fibrillation ([place]) 08/2011   First diagnosed in 08/2011; duration of arrhythmia is uncertain  . Bilateral lower extremity edema   . Chronic diarrhea    diverticulosis  . COPD (chronic obstructive pulmonary disease) ([place])   . Dementia ([place])   . Diabetes mellitus, type 2 (HCC)    Diabetic neuropathy  . Dyspnea on exertion    pedal edema  . Gout   . Headache(784.0)    twice weekly  . Hyperlipidemia   . Hypertension   . Osteopenia    DEXA scan 01/2010  . Palpitations   . Seasonal allergies   . Stress incontinence   . Vertigo     Past Surgical History:  Procedure Laterality Date  . APPENDECTOMY    . BREAST BIOPSY  2002   Right  . CESAREAN SECTION     x 2  . CHOLECYSTECTOMY    . COLONOSCOPY  2007   Negative screening study  . COLONOSCOPY WITH PROPOFOL N/A 09/18/2017   Procedure: COLONOSCOPY WITH PROPOFOL;  Surgeon: Daneil Dolin, MD;  [facility];  Service: Endoscopy;  Laterality: N/A;  11:00am  . HAMMER TOE SURGERY     Bilateral hammer toe amputation  . INCISIONAL HERNIA REPAIR    . KNEE ARTHROSCOPY W/ MENISCAL REPAIR  2007   Bilateral  . POLYPECTOMY  09/18/2017   Procedure: POLYPECTOMY;  Surgeon: Daneil Dolin, MD;  [facility];  Service: Endoscopy;;  colon  . UMBILICAL HERNIA REPAIR      Social History   Socioeconomic History  . Marital status: Widowed    Spouse  name: Not on file  . Number of children: 2  . Years of education: Not on file  . Highest education level: Not on file  Occupational History  . Occupation: Engineer, materials  Tobacco Use  . Smoking status: Never Smoker  . Smokeless tobacco: Never Used  Substance and Sexual Activity  . Alcohol use: No  . Drug use: No  . Sexual activity: Not on file  Other Topics Concern  . Not on file  Social History Narrative  . Not on file   Social Determinants of Health   Financial Resource Strain:   . Difficulty of Paying Living Expenses: Not on file  Food Insecurity:   . Worried About Charity fundraiser in the Last Year: Not on file  . Ran Out of Food in the Last Year: Not on file  Transportation Needs:   . Lack of Transportation (Medical): Not on file  . Lack of Transportation (Non-Medical): Not on file  Physical Activity:   . Days of Exercise per Week: Not on file  . Minutes of Exercise per Session: Not on file  Stress:   . Feeling of Stress : Not on file  Social Connections:   . Frequency  of Communication with Friends and Family: Not on file  . Frequency of Social Gatherings with Friends and Family: Not on file  . Attends Religious Services: Not on file  . Active Member of Clubs or Organizations: Not on file  . Attends Archivist Meetings: Not on file  . Marital Status: Not on file  Intimate Partner Violence:   . Fear of Current or Ex-Partner: Not on file  . Emotionally Abused: Not on file  . Physically Abused: Not on file  . Sexually Abused: Not on file   Family History  Adopted: Yes      VITAL SIGNS BP 120/68   Pulse 72   Temp 98.4 F (36.9 C) (Oral)   Resp (!) 24   Ht 5\' 6"  (1.676 m)   Wt 184 lb 6.4 oz (83.6 kg)   SpO2 98%   BMI 29.76 kg/m   Outpatient Encounter Medications as of 05/09/2019  Medication Sig  . acetaminophen (TYLENOL) 325 MG tablet Take 650 mg by mouth every 6 (six) hours as needed.   Marland Kitchen apixaban (ELIQUIS) 5 MG TABS tablet Take 5 mg by  mouth 2 (two) times daily.   . carboxymethylcellulose (REFRESH PLUS) 0.5 % SOLN Place 1 drop into both eyes 4 (four) times daily.  . cholecalciferol (VITAMIN D) 1000 UNITS tablet Take 1,000 Units by mouth daily.  Marland Kitchen CRANBERRY FRUIT PO Take by mouth. Give twice a day  . denosumab (PROLIA) 60 MG/ML SOSY injection Inject 60 mg into the skin every 6 (six) months.  . digoxin (LANOXIN) 0.125 MG tablet Take 1 tablet by mouth once a day (HOLD FOR AP UNDER 60)  . furosemide (LASIX) 40 MG tablet Take 40 mg by mouth daily.   . insulin aspart (NOVOLOG FLEXPEN) 100 UNIT/ML FlexPen Inject 10 Units into the skin daily. Give at 8 AM   . insulin aspart (NOVOLOG) 100 UNIT/ML injection Inject 13 Units into the skin 2 (two) times daily. If BS above 150  . Insulin Glargine (BASAGLAR KWIKPEN) 100 UNIT/ML SOPN Inject 50 Units into the skin at bedtime.   . memantine (NAMENDA) 10 MG tablet Take 10 mg by mouth 2 (two) times daily.  . metolazone (ZAROXOLYN) 2.5 MG tablet Take 5 mg by mouth See admin instructions. Mon and Friday.  2 tabs to = 5 mg  . metoprolol tartrate (LOPRESSOR) 25 MG tablet Take 50 mg by mouth 2 (two) times daily.   . NON FORMULARY Diet Type:  NAS, consistent CHO, Dysphagia 3 with thin liquids  Give Cranberry Juice two times daily at 9 am and 5 pm  . omeprazole (PRILOSEC) 40 MG capsule Take 40 mg by mouth daily.  . polyethylene glycol (MIRALAX / GLYCOLAX) packet Take 17 g by mouth daily as needed.   . potassium chloride (MICRO-K) 10 MEQ CR capsule Give 4 tablets (40 mg) by mouth three times daily  . pravastatin (PRAVACHOL) 20 MG tablet Take 20 mg by mouth daily. Take along with 40 mg to = 60 mg  . pravastatin (PRAVACHOL) 40 MG tablet Take 40 mg by mouth daily. Take along with 20 mg to = 60 mg  . sitaGLIPtin-metformin (JANUMET) 50-500 MG tablet Take 1 tablet by mouth 2 (two) times daily with a meal.  . [DISCONTINUED] Multiple Vitamin (MULTIVITAMIN WITH MINERALS) TABS Take 1 tablet by mouth every  morning.   No facility-administered encounter medications on file as of 05/09/2019.     SIGNIFICANT DIAGNOSTIC EXAMS   PREVIOUS  02-21-19: DEXA: t score -  3.504  NO NEW EXAMS.    LABS REVIEWED PREVIOUS:   04-26-18: wbc 7.9; hgb 11.7; hct 38.0; mcv 90.9; plt 241; glucose 147; bun 24; creat 1.03 ;k+ 3.6; na++ 138; ca 9.3  05-23-18: hgb a1c 8.0  08-09-18: wbc 10.3 hgb 12.1; hct 39.2 mcv 92.2 ;plt 242; glucose 86 bun 31; creat 1.08; k+ 3.2; na++ 141; ca 9.3; chol 148; ldl 95; trig 107; hdl 32 hgb a1c 7.9 08-23-18: glucose 174; bun 30; creat 0.99; k+ 3.6; na++ 142; ca 9.1  10-09-18: wbc 8.3; hgb 11.9; hct 38.2; mcv 91.6; plt 232; gluocse 203; bun 29; creat 1.06; k+ 3.5;na++ 137; ca 8.2; tsh 2.608; dig 1.0   11-14-18: dig 0.9 12-18-18: hgb a1c 7.8   01-24-19: urine micro-albumin: 5.3 04-23-19: wbc 9.6; hgb 13.0; hct 41.4; mcv 90. 6 plt 280; glucose 187; bun 37; creat 1.36; k+ 3.3; na++ 139; ca 9.3 liver normal albumin 3.3   TODAY  05-09-19: k+ 3.3; vit B12: 480  Review of Systems  Unable to perform ROS: Dementia (unable to participate )   Physical Exam Constitutional:      General: She is not in acute distress.    Appearance: She is well-developed. She is not diaphoretic.  Neck:     Thyroid: No thyromegaly.  Cardiovascular:     Rate and Rhythm: Normal rate. Rhythm irregular.     Heart sounds: Normal heart sounds.  Pulmonary:     Effort: Pulmonary effort is normal. No respiratory distress.     Breath sounds: Normal breath sounds.  Abdominal:     General: Bowel sounds are normal. There is no distension.     Palpations: Abdomen is soft.     Tenderness: There is no abdominal tenderness.  Musculoskeletal:        General: Normal range of motion.     Cervical back: Neck supple.     Right lower leg: No edema.     Left lower leg: No edema.  Lymphadenopathy:     Cervical: No cervical adenopathy.  Skin:    General: Skin is warm and dry.  Neurological:     Mental Status: She is alert.  Mental status is at baseline.  Psychiatric:        Mood and Affect: Mood normal.       ASSESSMENT/ PLAN:  TODAY  1. Hypokalemia: is worse: k+ 3.3 will increase k+ to 40 meq three times daily will repeat on 05-13-19   MD is aware of resident's narcotic use and is in agreement with current plan of care. We will attempt to wean resident as appropriate.  Ok Edwards NP [facility]  Contact [phone] Monday through Friday 8am- 5pm  After hours call [phone]

## 2019-05-10 DIAGNOSIS — F329 Major depressive disorder, single episode, unspecified: Secondary | ICD-10-CM | POA: Diagnosis not present

## 2019-05-10 DIAGNOSIS — M810 Age-related osteoporosis without current pathological fracture: Secondary | ICD-10-CM | POA: Diagnosis not present

## 2019-05-10 DIAGNOSIS — R41841 Cognitive communication deficit: Secondary | ICD-10-CM | POA: Diagnosis not present

## 2019-05-10 DIAGNOSIS — F0390 Unspecified dementia without behavioral disturbance: Secondary | ICD-10-CM | POA: Diagnosis not present

## 2019-05-10 DIAGNOSIS — Z741 Need for assistance with personal care: Secondary | ICD-10-CM | POA: Diagnosis not present

## 2019-05-10 DIAGNOSIS — M6281 Muscle weakness (generalized): Secondary | ICD-10-CM | POA: Diagnosis not present

## 2019-05-13 ENCOUNTER — Other Ambulatory Visit (HOSPITAL_COMMUNITY)
Admission: RE | Admit: 2019-05-13 | Discharge: 2019-05-13 | Disposition: A | Discharge status: Home / Self Care | Payer: Medicare Other | Source: Skilled Nursing Facility | Attending: Adult Health | Admitting: Adult Health

## 2019-05-13 DIAGNOSIS — M6281 Muscle weakness (generalized): Secondary | ICD-10-CM | POA: Diagnosis not present

## 2019-05-13 DIAGNOSIS — F0390 Unspecified dementia without behavioral disturbance: Secondary | ICD-10-CM | POA: Diagnosis not present

## 2019-05-13 DIAGNOSIS — E871 Hypo-osmolality and hyponatremia: Secondary | ICD-10-CM | POA: Diagnosis not present

## 2019-05-13 DIAGNOSIS — Z741 Need for assistance with personal care: Secondary | ICD-10-CM | POA: Diagnosis not present

## 2019-05-13 DIAGNOSIS — R41841 Cognitive communication deficit: Secondary | ICD-10-CM | POA: Diagnosis not present

## 2019-05-13 DIAGNOSIS — F329 Major depressive disorder, single episode, unspecified: Secondary | ICD-10-CM | POA: Diagnosis not present

## 2019-05-13 DIAGNOSIS — M810 Age-related osteoporosis without current pathological fracture: Secondary | ICD-10-CM | POA: Diagnosis not present

## 2019-05-13 LAB — POTASSIUM: Potassium: 3.8 mmol/L (ref 3.5–5.1)

## 2019-05-14 DIAGNOSIS — M810 Age-related osteoporosis without current pathological fracture: Secondary | ICD-10-CM | POA: Diagnosis not present

## 2019-05-14 DIAGNOSIS — M6281 Muscle weakness (generalized): Secondary | ICD-10-CM | POA: Diagnosis not present

## 2019-05-14 DIAGNOSIS — R41841 Cognitive communication deficit: Secondary | ICD-10-CM | POA: Diagnosis not present

## 2019-05-14 DIAGNOSIS — Z741 Need for assistance with personal care: Secondary | ICD-10-CM | POA: Diagnosis not present

## 2019-05-14 DIAGNOSIS — F329 Major depressive disorder, single episode, unspecified: Secondary | ICD-10-CM | POA: Diagnosis not present

## 2019-05-14 DIAGNOSIS — F0390 Unspecified dementia without behavioral disturbance: Secondary | ICD-10-CM | POA: Diagnosis not present

## 2019-05-15 DIAGNOSIS — R41841 Cognitive communication deficit: Secondary | ICD-10-CM | POA: Diagnosis not present

## 2019-05-15 DIAGNOSIS — F329 Major depressive disorder, single episode, unspecified: Secondary | ICD-10-CM | POA: Diagnosis not present

## 2019-05-15 DIAGNOSIS — M810 Age-related osteoporosis without current pathological fracture: Secondary | ICD-10-CM | POA: Diagnosis not present

## 2019-05-15 DIAGNOSIS — Z741 Need for assistance with personal care: Secondary | ICD-10-CM | POA: Diagnosis not present

## 2019-05-15 DIAGNOSIS — M6281 Muscle weakness (generalized): Secondary | ICD-10-CM | POA: Diagnosis not present

## 2019-05-15 DIAGNOSIS — F0390 Unspecified dementia without behavioral disturbance: Secondary | ICD-10-CM | POA: Diagnosis not present

## 2019-05-16 DIAGNOSIS — Z741 Need for assistance with personal care: Secondary | ICD-10-CM | POA: Diagnosis not present

## 2019-05-16 DIAGNOSIS — M810 Age-related osteoporosis without current pathological fracture: Secondary | ICD-10-CM | POA: Diagnosis not present

## 2019-05-16 DIAGNOSIS — F329 Major depressive disorder, single episode, unspecified: Secondary | ICD-10-CM | POA: Diagnosis not present

## 2019-05-16 DIAGNOSIS — E1140 Type 2 diabetes mellitus with diabetic neuropathy, unspecified: Secondary | ICD-10-CM | POA: Diagnosis not present

## 2019-05-16 DIAGNOSIS — I5042 Chronic combined systolic (congestive) and diastolic (congestive) heart failure: Secondary | ICD-10-CM | POA: Diagnosis not present

## 2019-05-16 DIAGNOSIS — R41841 Cognitive communication deficit: Secondary | ICD-10-CM | POA: Diagnosis not present

## 2019-05-16 DIAGNOSIS — M6281 Muscle weakness (generalized): Secondary | ICD-10-CM | POA: Diagnosis not present

## 2019-05-16 DIAGNOSIS — F0390 Unspecified dementia without behavioral disturbance: Secondary | ICD-10-CM | POA: Diagnosis not present

## 2019-05-16 DIAGNOSIS — Z1159 Encounter for screening for other viral diseases: Secondary | ICD-10-CM | POA: Diagnosis not present

## 2019-05-17 DIAGNOSIS — F0390 Unspecified dementia without behavioral disturbance: Secondary | ICD-10-CM | POA: Diagnosis not present

## 2019-05-17 DIAGNOSIS — R41841 Cognitive communication deficit: Secondary | ICD-10-CM | POA: Diagnosis not present

## 2019-05-17 DIAGNOSIS — Z741 Need for assistance with personal care: Secondary | ICD-10-CM | POA: Diagnosis not present

## 2019-05-17 DIAGNOSIS — M810 Age-related osteoporosis without current pathological fracture: Secondary | ICD-10-CM | POA: Diagnosis not present

## 2019-05-17 DIAGNOSIS — F329 Major depressive disorder, single episode, unspecified: Secondary | ICD-10-CM | POA: Diagnosis not present

## 2019-05-17 DIAGNOSIS — M6281 Muscle weakness (generalized): Secondary | ICD-10-CM | POA: Diagnosis not present

## 2019-05-20 DIAGNOSIS — F4323 Adjustment disorder with mixed anxiety and depressed mood: Secondary | ICD-10-CM | POA: Diagnosis not present

## 2019-05-20 DIAGNOSIS — F331 Major depressive disorder, recurrent, moderate: Secondary | ICD-10-CM | POA: Diagnosis not present

## 2019-05-22 ENCOUNTER — Non-Acute Institutional Stay (SKILLED_NURSING_FACILITY): Payer: Medicare Other | Admitting: Adult Health

## 2019-05-22 ENCOUNTER — Encounter: Payer: Self-pay | Admitting: Adult Health

## 2019-05-22 DIAGNOSIS — E1165 Type 2 diabetes mellitus with hyperglycemia: Secondary | ICD-10-CM | POA: Diagnosis not present

## 2019-05-22 DIAGNOSIS — E1151 Type 2 diabetes mellitus with diabetic peripheral angiopathy without gangrene: Secondary | ICD-10-CM

## 2019-05-22 DIAGNOSIS — IMO0002 Reserved for concepts with insufficient information to code with codable children: Secondary | ICD-10-CM

## 2019-05-22 NOTE — Progress Notes (Signed)
Location:    [place] Room Number: [id number] Place of Service:  SNF (31) Phillips Grout NP    CODE STATUS: DNR  Allergies  Allergen Reactions  . Codeine Anaphylaxis and Hives  . Morphine And Related Anaphylaxis and Hives  . Penicillins Anaphylaxis  . Ace Inhibitors Cough    Chief Complaint  Patient presents with  . Acute Visit    Diabetes    HPI:  Her cbg readings have been elevated. She is presently taking junamet twice daily basaglar 50 units nightly  and novolog 10 units with  breakfast 13 units with lunch and supper. There are no reports of excessive hunger or thirst.   Past Medical History:  Diagnosis Date  . Allergy   . Atrial fibrillation ([place]) 08/2011   First diagnosed in 08/2011; duration of arrhythmia is uncertain  . Bilateral lower extremity edema   . Chronic diarrhea    diverticulosis  . COPD (chronic obstructive pulmonary disease) ([place])   . Dementia ([place])   . Diabetes mellitus, type 2 (HCC)    Diabetic neuropathy  . Dyspnea on exertion    pedal edema  . Gout   . Headache(784.0)    twice weekly  . Hyperlipidemia   . Hypertension   . Osteopenia    DEXA scan 01/2010  . Palpitations   . Seasonal allergies   . Stress incontinence   . Vertigo     Past Surgical History:  Procedure Laterality Date  . APPENDECTOMY    . BREAST BIOPSY  2002   Right  . CESAREAN SECTION     x 2  . CHOLECYSTECTOMY    . COLONOSCOPY  2007   Negative screening study  . COLONOSCOPY WITH PROPOFOL N/A 09/18/2017   Procedure: COLONOSCOPY WITH PROPOFOL;  Surgeon: Daneil Dolin, MD;  [facility];  Service: Endoscopy;  Laterality: N/A;  11:00am  . HAMMER TOE SURGERY     Bilateral hammer toe amputation  . INCISIONAL HERNIA REPAIR    . KNEE ARTHROSCOPY W/ MENISCAL REPAIR  2007   Bilateral  . POLYPECTOMY  09/18/2017   Procedure: POLYPECTOMY;  Surgeon: Daneil Dolin, MD;  Location: [facility];  Service: Endoscopy;;  colon  . UMBILICAL HERNIA  REPAIR      Social History   Socioeconomic History  . Marital status: Widowed    Spouse name: Not on file  . Number of children: 2  . Years of education: Not on file  . Highest education level: Not on file  Occupational History  . Occupation: Engineer, materials  Tobacco Use  . Smoking status: Never Smoker  . Smokeless tobacco: Never Used  Substance and Sexual Activity  . Alcohol use: No  . Drug use: No  . Sexual activity: Not on file  Other Topics Concern  . Not on file  Social History Narrative  . Not on file   Social Determinants of Health   Financial Resource Strain:   . Difficulty of Paying Living Expenses: Not on file  Food Insecurity:   . Worried About Charity fundraiser in the Last Year: Not on file  . Ran Out of Food in the Last Year: Not on file  Transportation Needs:   . Lack of Transportation (Medical): Not on file  . Lack of Transportation (Non-Medical): Not on file  Physical Activity:   . Days of Exercise per Week: Not on file  . Minutes of Exercise per Session: Not on file  Stress:   .  Feeling of Stress : Not on file  Social Connections:   . Frequency of Communication with Friends and Family: Not on file  . Frequency of Social Gatherings with Friends and Family: Not on file  . Attends Religious Services: Not on file  . Active Member of Clubs or Organizations: Not on file  . Attends Archivist Meetings: Not on file  . Marital Status: Not on file  Intimate Partner Violence:   . Fear of Current or Ex-Partner: Not on file  . Emotionally Abused: Not on file  . Physically Abused: Not on file  . Sexually Abused: Not on file   Family History  Adopted: Yes      VITAL SIGNS BP 112/70   Pulse 82   Temp 98.6 F (37 C) (Oral)   Resp 20   Ht 5\' 6"  (1.676 m)   Wt 184 lb 6.4 oz (83.6 kg)   SpO2 98%   BMI 29.76 kg/m   Outpatient Encounter Medications as of 05/22/2019  Medication Sig  . acetaminophen (TYLENOL) 325 MG tablet Take 650 mg by  mouth every 6 (six) hours as needed.   Marland Kitchen apixaban (ELIQUIS) 5 MG TABS tablet Take 5 mg by mouth 2 (two) times daily.   . carboxymethylcellulose (REFRESH PLUS) 0.5 % SOLN Place 1 drop into both eyes 4 (four) times daily.  . cholecalciferol (VITAMIN D) 1000 UNITS tablet Take 1,000 Units by mouth daily.  Marland Kitchen CRANBERRY FRUIT PO Take by mouth. Give Cranberry Juice, BID Twice A Day  . denosumab (PROLIA) 60 MG/ML SOSY injection Inject 60 mg into the skin every 6 (six) months.  . digoxin (LANOXIN) 0.125 MG tablet Take 1 tablet by mouth once a day (HOLD FOR AP UNDER 60)  . furosemide (LASIX) 40 MG tablet Take 40 mg by mouth daily.   . insulin aspart (NOVOLOG FLEXPEN) 100 UNIT/ML FlexPen Inject 10 Units into the skin daily. Give at 8 AM   . insulin aspart (NOVOLOG) 100 UNIT/ML injection Inject 13 Units into the skin 2 (two) times daily. If BS above 150  . Insulin Glargine (BASAGLAR KWIKPEN) 100 UNIT/ML SOPN Inject 50 Units into the skin at bedtime.   . memantine (NAMENDA) 10 MG tablet Take 10 mg by mouth 2 (two) times daily.  . metolazone (ZAROXOLYN) 5 MG tablet Take 5 mg by mouth 2 (two) times daily. On Monday and Friday  . metoprolol succinate (TOPROL-XL) 50 MG 24 hr tablet Take 50 mg by mouth 2 (two) times daily. Take with or immediately following a meal.  . NON FORMULARY Diet Type:  NAS, consistent CHO, Dysphagia 3 with thin liquids  Give Cranberry Juice two times daily at 9 am and 5 pm  . omeprazole (PRILOSEC) 40 MG capsule Take 40 mg by mouth daily.  . polyethylene glycol (MIRALAX / GLYCOLAX) packet Take 17 g by mouth daily as needed. daily prn for constipation - mix with 6 oz of liquid and drink  . potassium chloride SA (KLOR-CON) 20 MEQ tablet Take 40 mEq by mouth 3 (three) times daily.  . pravastatin (PRAVACHOL) 20 MG tablet Take 20 mg by mouth daily. Take along with 40 mg to = 60 mg  . pravastatin (PRAVACHOL) 40 MG tablet Take 40 mg by mouth daily. Take along with 20 mg to = 60 mg  .  sitaGLIPtin-metformin (JANUMET) 50-500 MG tablet Take 1 tablet by mouth 2 (two) times daily with a meal.  . [DISCONTINUED] metolazone (ZAROXOLYN) 2.5 MG tablet Take 5 mg by  mouth See admin instructions. Mon and Friday.  2 tabs to = 5 mg  . [DISCONTINUED] metoprolol tartrate (LOPRESSOR) 25 MG tablet Take 50 mg by mouth 2 (two) times daily.   . [DISCONTINUED] potassium chloride (MICRO-K) 10 MEQ CR capsule Give 4 tablets (40 mg) by mouth three times daily   No facility-administered encounter medications on file as of 05/22/2019.     SIGNIFICANT DIAGNOSTIC EXAMS   PREVIOUS  02-21-19: DEXA: t score -3.504  NO NEW EXAMS.    LABS REVIEWED PREVIOUS:   05-23-18: hgb a1c 8.0  08-09-18: wbc 10.3 hgb 12.1; hct 39.2 mcv 92.2 ;plt 242; glucose 86 bun 31; creat 1.08; k+ 3.2; na++ 141; ca 9.3; chol 148; ldl 95; trig 107; hdl 32 hgb a1c 7.9 08-23-18: glucose 174; bun 30; creat 0.99; k+ 3.6; na++ 142; ca 9.1  10-09-18: wbc 8.3; hgb 11.9; hct 38.2; mcv 91.6; plt 232; gluocse 203; bun 29; creat 1.06; k+ 3.5;na++ 137; ca 8.2; tsh 2.608; dig 1.0   11-14-18: dig 0.9 12-18-18: hgb a1c 7.8   01-24-19: urine micro-albumin: 5.3 04-23-19: wbc 9.6; hgb 13.0; hct 41.4; mcv 90. 6 plt 280; glucose 187; bun 37; creat 1.36; k+ 3.3; na++ 139; ca 9.3 liver normal albumin 3.3   TODAY  05-01-19 k+ 4.3 05-09-19: vit B 12: 480 k+ 3.3 05-13-19: k+ 3.8    Review of Systems  Unable to perform ROS: Dementia (unable to participate )    Physical Exam Constitutional:      General: She is not in acute distress.    Appearance: She is well-developed. She is not diaphoretic.  Neck:     Thyroid: No thyromegaly.  Cardiovascular:     Rate and Rhythm: Normal rate. Rhythm irregular.     Pulses: Normal pulses.     Heart sounds: Normal heart sounds.  Pulmonary:     Effort: Pulmonary effort is normal. No respiratory distress.     Breath sounds: Normal breath sounds.  Abdominal:     General: Bowel sounds are normal. There is no  distension.     Palpations: Abdomen is soft.     Tenderness: There is no abdominal tenderness.  Musculoskeletal:        General: Normal range of motion.     Cervical back: Neck supple.     Right lower leg: No edema.     Left lower leg: No edema.  Lymphadenopathy:     Cervical: No cervical adenopathy.  Skin:    General: Skin is warm and dry.  Neurological:     Mental Status: She is alert. Mental status is at baseline.  Psychiatric:        Mood and Affect: Mood normal.       ASSESSMENT/ PLAN:  TODAY  1. Type 2 diabetes mellitus uncontrolled with peripheral circulatory disorder: hgb a1c 7.9. cbg's not well controlled: will continue junamet 50/500 mg twice daily will change to: basaglar 53 units nightly and novolog 17 units with meals will monitor    Type 2 diabetes uncontrolled with periperal circulatory disorder: hgb a1c 7.9 will continue junamet 50/500 twice daily basaglar 50 units nightly and novolog 10 units with breakfast 13 units with lunch and supper.   MD is aware of resident's narcotic use and is in agreement with current plan of care. We will attempt to wean resident as appropriate.  Ok Edwards NP [facility]  Contact [phone] Monday through Friday 8am- 5pm  After hours call [phone]

## 2019-05-23 DIAGNOSIS — E1140 Type 2 diabetes mellitus with diabetic neuropathy, unspecified: Secondary | ICD-10-CM | POA: Diagnosis not present

## 2019-05-23 DIAGNOSIS — I5042 Chronic combined systolic (congestive) and diastolic (congestive) heart failure: Secondary | ICD-10-CM | POA: Diagnosis not present

## 2019-05-23 DIAGNOSIS — Z1159 Encounter for screening for other viral diseases: Secondary | ICD-10-CM | POA: Diagnosis not present

## 2019-05-30 ENCOUNTER — Non-Acute Institutional Stay (SKILLED_NURSING_FACILITY): Payer: Medicare Other | Admitting: Internal Medicine

## 2019-05-30 ENCOUNTER — Encounter: Payer: Self-pay | Admitting: Internal Medicine

## 2019-05-30 DIAGNOSIS — E1151 Type 2 diabetes mellitus with diabetic peripheral angiopathy without gangrene: Secondary | ICD-10-CM

## 2019-05-30 DIAGNOSIS — R600 Localized edema: Secondary | ICD-10-CM | POA: Diagnosis not present

## 2019-05-30 DIAGNOSIS — N182 Chronic kidney disease, stage 2 (mild): Secondary | ICD-10-CM

## 2019-05-30 DIAGNOSIS — F0391 Unspecified dementia with behavioral disturbance: Secondary | ICD-10-CM

## 2019-05-30 DIAGNOSIS — E1165 Type 2 diabetes mellitus with hyperglycemia: Secondary | ICD-10-CM

## 2019-05-30 DIAGNOSIS — I5042 Chronic combined systolic (congestive) and diastolic (congestive) heart failure: Secondary | ICD-10-CM | POA: Diagnosis not present

## 2019-05-30 DIAGNOSIS — I482 Chronic atrial fibrillation, unspecified: Secondary | ICD-10-CM | POA: Diagnosis not present

## 2019-05-30 DIAGNOSIS — IMO0002 Reserved for concepts with insufficient information to code with codable children: Secondary | ICD-10-CM

## 2019-05-30 DIAGNOSIS — E1140 Type 2 diabetes mellitus with diabetic neuropathy, unspecified: Secondary | ICD-10-CM | POA: Diagnosis not present

## 2019-05-30 DIAGNOSIS — E1122 Type 2 diabetes mellitus with diabetic chronic kidney disease: Secondary | ICD-10-CM

## 2019-05-30 DIAGNOSIS — Z1159 Encounter for screening for other viral diseases: Secondary | ICD-10-CM | POA: Diagnosis not present

## 2019-05-30 NOTE — Progress Notes (Signed)
Location:  [place] Room Number: [id number] Place of Service:  SNF [phone]) Provider:  Wille Celeste, PA-C   Hennie Duos, MD  Patient Care Team: Hennie Duos, MD as PCP - General (Internal Medicine) Rothbart, Cristopher Estimable, MD (Cardiology) [facility]) [place] Cowden, Phylis Bougie, NP as Nurse Practitioner (Geriatric Medicine)  Extended Emergency Contact Information Primary Emergency Contact: [facility] Address: [address]          [place], [place] [address] [name] of [place] Phone: [phone] Mobile Phone: [phone] Relation: Daughter  Code Status:  DNR Goals of care: Advanced Directive information Advanced Directives 05/30/2019  Does Patient Have a Medical Advance Directive? Yes  Type of Advance Directive Out of facility DNR (pink MOST or yellow form)  Does patient want to make changes to medical advance directive? No - Patient declined  Copy of [place] in Chart? -  Pre-existing out of facility DNR order (yellow form or pink MOST form) Yellow form placed in chart (order not valid for inpatient use)     Chief Complaint  Patient presents with  . Medical Management of Chronic Issues    Routine Visit   Medical management of chronic medical conditions including dementia-lower extremity edema-atrial fibrillation-chronic kidney disease-type 2 diabetes-osteoporosis.   HPI:  Pt is a 79 y.o. female seen today for medical management of chronic diseases.  As noted above.  Nursing does not report recent acute issues She was seen recently for elevated blood sugars she is a type II diabetic and her Basaglar insulin  was increased to 53 units her NovoLog with meals also was increased to 17 units.  She also is on Januvia 50-500 twice daily  She continues to have some variability with morning sugars ranging from 97 up to the mid 200s recently-at noon blood sugars appear to be largely in the 200s  range to around 300 at times- at dinnertime some more variability ranging from 123 to mid 200s recently  Regards to her other issues she does have a history of dementia she is on Namenda 10 mg twice daily weight has been stable in the mid 180s.  She does have lower extremity edema continues on Lasix 40 mg a day and metolazone 5 mg twice a week-again her weight and edema appear to be stable.  For atrial fibrillation she is on digoxin she is also on Lopressor 50 mg twice daily for rate control-this appears to be stable she is on Eliquis 5 mg twice daily for anticoagulation.  Currently she is sitting in her chair comfortably her dementia continues to slowly progress per nursing she is pleasant and cooperative with exam but is confused  Past Medical History:  Diagnosis Date  . Allergy   . Atrial fibrillation ([place]) 08/2011   First diagnosed in 08/2011; duration of arrhythmia is uncertain  . Bilateral lower extremity edema   . Chronic diarrhea    diverticulosis  . COPD (chronic obstructive pulmonary disease) ([place])   . Dementia ([place])   . Diabetes mellitus, type 2 (HCC)    Diabetic neuropathy  . Dyspnea on exertion    pedal edema  . Gout   . Headache(784.0)    twice weekly  . Hyperlipidemia   . Hypertension   . Osteopenia    DEXA scan 01/2010  . Palpitations   . Seasonal allergies   . Stress incontinence   . Vertigo    Past Surgical History:  Procedure Laterality Date  .  APPENDECTOMY    . BREAST BIOPSY  2002   Right  . CESAREAN SECTION     x 2  . CHOLECYSTECTOMY    . COLONOSCOPY  2007   Negative screening study  . COLONOSCOPY WITH PROPOFOL N/A 09/18/2017   Procedure: COLONOSCOPY WITH PROPOFOL;  Surgeon: Daneil Dolin, MD;  [facility];  Service: Endoscopy;  Laterality: N/A;  11:00am  . HAMMER TOE SURGERY     Bilateral hammer toe amputation  . INCISIONAL HERNIA REPAIR    . KNEE ARTHROSCOPY W/ MENISCAL REPAIR  2007   Bilateral  . POLYPECTOMY  09/18/2017    Procedure: POLYPECTOMY;  Surgeon: Daneil Dolin, MD;  [facility];  Service: Endoscopy;;  colon  . UMBILICAL HERNIA REPAIR      Allergies  Allergen Reactions  . Codeine Anaphylaxis and Hives  . Morphine And Related Anaphylaxis and Hives  . Penicillins Anaphylaxis  . Ace Inhibitors Cough    Outpatient Encounter Medications as of 05/30/2019  Medication Sig  . acetaminophen (TYLENOL) 325 MG tablet Take 650 mg by mouth every 6 (six) hours as needed.   Marland Kitchen apixaban (ELIQUIS) 5 MG TABS tablet Take 5 mg by mouth 2 (two) times daily.   . carboxymethylcellulose (REFRESH PLUS) 0.5 % SOLN Place 1 drop into both eyes 4 (four) times daily.  . cholecalciferol (VITAMIN D) 1000 UNITS tablet Take 1,000 Units by mouth daily.  Marland Kitchen CRANBERRY FRUIT PO Take by mouth. Give Cranberry Juice, BID Twice A Day  . denosumab (PROLIA) 60 MG/ML SOSY injection Inject 60 mg into the skin every 6 (six) months.  . digoxin (LANOXIN) 0.125 MG tablet Take 1 tablet by mouth once a day (HOLD FOR AP UNDER 60)  . furosemide (LASIX) 40 MG tablet Take 40 mg by mouth daily.   Marland Kitchen glucose blood test strip 1 each by Other route 3 (three) times daily. Notify MD of results under 60 or over 300  . insulin aspart (NOVOLOG) 100 UNIT/ML injection Inject 17 Units into the skin 3 (three) times daily with meals. If BS above 150  . memantine (NAMENDA) 10 MG tablet Take 10 mg by mouth 2 (two) times daily.  . metolazone (ZAROXOLYN) 5 MG tablet Take 5 mg by mouth 2 (two) times daily. On Monday and Friday  . metoprolol succinate (TOPROL-XL) 50 MG 24 hr tablet Take 50 mg by mouth 2 (two) times daily. Take with or immediately following a meal.  . NON FORMULARY Diet Type:  NAS, consistent CHO, Dysphagia 3 with thin liquids  Give Cranberry Juice two times daily at 9 am and 5 pm  . omeprazole (PRILOSEC) 40 MG capsule Take 40 mg by mouth daily.  . polyethylene glycol (MIRALAX / GLYCOLAX) packet Take 17 g by mouth daily as needed. daily prn for  constipation - mix with 6 oz of liquid and drink  . potassium chloride SA (KLOR-CON) 20 MEQ tablet Take 40 mEq by mouth 3 (three) times daily.  . pravastatin (PRAVACHOL) 20 MG tablet Take 20 mg by mouth daily. Take along with 40 mg to = 60 mg  . pravastatin (PRAVACHOL) 40 MG tablet Take 40 mg by mouth daily. Take along with 20 mg to = 60 mg  . sitaGLIPtin-metformin (JANUMET) 50-500 MG tablet Take 1 tablet by mouth 2 (two) times daily with a meal.  . [DISCONTINUED] insulin aspart (NOVOLOG FLEXPEN) 100 UNIT/ML FlexPen Inject 10 Units into the skin daily. Give at 8 AM   . [DISCONTINUED] Insulin  Glargine (BASAGLAR KWIKPEN) 100 UNIT/ML SOPN Inject 50 Units into the skin at bedtime.    No facility-administered encounter medications on file as of 05/30/2019.    OF NOTE--_SHE IS ON BASAGLAR INSULIN 53 UNITS QD CURRENTLY__increased from 50 units previously   Review of Systems   Is essentially unobtainable secondary to dementia nursing does not report any recent issues she is is not complaining of being in pain or having any short of breath or discomfort  Immunization History  Administered Date(s) Administered  . Influenza-Unspecified 02/03/2017, 02/01/2018, 02/04/2019  . Moderna SARS-COVID-2 Vaccination 05/08/2019  . Pneumococcal Conjugate-13 02/03/2017  . Pneumococcal-Unspecified 02/08/2016  . Tdap 01/19/2017  . Zoster 08/02/2018   Pertinent  Health Maintenance Due  Topic Date Due  . HEMOGLOBIN A1C  06/20/2019  . URINE MICROALBUMIN  01/24/2020  . FOOT EXAM  02/22/2020  . OPHTHALMOLOGY EXAM  03/10/2020  . INFLUENZA VACCINE  Completed  . DEXA SCAN  Completed  . PNA vac Low Risk Adult  Completed   Fall Risk  12/17/2018 12/17/2018 01/30/2018 01/19/2017 11/06/2015  Falls in the past year? 1 1 No No Yes  Number falls in past yr: 1 1 - - 2 or more  Injury with Fall? 0 0 - - No  Risk for fall due to : History of fall(s);Impaired balance/gait;Impaired mobility History of fall(s);Impaired  balance/gait;Impaired mobility - - -  Follow up Falls evaluation completed Falls evaluation completed - - -   Functional Status Survey:    Vitals:   05/30/19 0903  BP: 112/70  Pulse: 84  Resp: 20  Temp: 98.2 F (36.8 C)  TempSrc: Oral  SpO2: 98%  Weight: 184 lb 12.8 oz (83.8 kg)  Height: 5\' 6"  (1.676 m)   Body mass index is 29.83 kg/m. Physical Exam   In general this is a pleasant [place] confused elderly female in no distress sitting comfortably in her wheelchair.  Her skin is warm and dry she does have venous stasis changes lower extremities bilaterally this appears baseline.  Eyes visual acuity appears to be intact sclera and conjunctive are clear.  Oropharynx is clear mucous membranes moist.  Chest is clear to auscultation there is no labored breathing.  Heart is regular irregular rate and rhythm without murmur gallop or rub she has baseline lower extremity edema a little more on the right versus the left.  Abdomen is obese soft nontender with positive bowel sounds.  Musculoskeletal is able to move all extremities x4 at baseline.  Neurologic is grossly intact cannot really appreciate lateralizing findings her speech is clear.  Psych she is she is oriented to self --will follow simple verbal commands with prompting-she is pleasant and cooperative.    Labs reviewed: Recent Labs    10/09/18 0702 10/09/18 0702 04/05/19 0405 04/05/19 0405 04/23/19 1227 04/23/19 1227 05/01/19 0415 05/09/19 0500 05/13/19 0410  NA 137  --  138  --  139  --   --   --   --   K 3.5   < > 4.3   < > 3.3*   < > 4.3 3.3* 3.8  CL 99  --  101  --  97*  --   --   --   --   CO2 29  --  26  --  28  --   --   --   --   GLUCOSE 203*  --  132*  --  187*  --   --   --   --  BUN 29*  --  34*  --  37*  --   --   --   --   CREATININE 1.06*  --  1.25*  --  1.36*  --   --   --   --   CALCIUM 9.2  --  9.4  --  9.3  --   --   --   --    < > = values in this interval not displayed.   Recent Labs     04/23/19 1227  AST 21  ALT 19  ALKPHOS 60  BILITOT 0.5  PROT 7.4  ALBUMIN 3.2*   Recent Labs    08/09/18 0318 10/09/18 0702 04/23/19 1227  WBC 10.3 8.3 9.6  NEUTROABS 6.4 5.3 6.4  HGB 12.1 11.9* 13.0  HCT 39.2 38.2 41.4  MCV 92.2 91.6 90.6  PLT 242 232 280   Lab Results  Component Value Date   TSH 2.608 10/09/2018   Lab Results  Component Value Date   HGBA1C 7.8 (H) 12/18/2018   Lab Results  Component Value Date   CHOL 148 08/09/2018   HDL 32 (L) 08/09/2018   LDLCALC 95 08/09/2018   TRIG 107 08/09/2018   CHOLHDL 4.6 08/09/2018    Significant Diagnostic Results in last 30 days:   Assessment/Plan  #1 history of dementia at this point appears to be stable continue supportive care her weight is stable she does have some gradually progressive dementia but this is to be expected she is on Namenda 10 mg twice daily.  2.  History of edema lower extremity this appears baseline her weight is stable she is on Lasix 40 mg a day and metolazone 2 times a week 5 mg-we will update a metabolic panel it appears on last metabolic panel potassium was borderline low at 3.3 she is on supplementation 40 mEq 3 times daily.  3.  Atrial fibrillation appears rate controlled she is on digoxin 0.125 mg a day as well as Lopressor 50 mg twice daily-she continues on Eliquis 5 mg twice daily for anticoagulation.  4.  History of chronic kidney disease last creatinine was 1.36 on lab done about a month ago baseline appears to be in the low ones will have this updated as well.  5.  History of diabetes type 2 recent adjustments were made including increasing her Basaglar  to 53 units and NovoLog with meals to 17 units she continues on Janumet 50-500 mg twice daily-she still has somewhat variable blood sugars at this point will give then medication changes a bit more time to work.  [id number]

## 2019-05-31 ENCOUNTER — Other Ambulatory Visit (HOSPITAL_COMMUNITY)
Admission: RE | Admit: 2019-05-31 | Discharge: 2019-05-31 | Disposition: A | Discharge status: Home / Self Care | Payer: Medicare Other | Source: Ambulatory Visit | Attending: Internal Medicine | Admitting: Internal Medicine

## 2019-05-31 DIAGNOSIS — I5042 Chronic combined systolic (congestive) and diastolic (congestive) heart failure: Secondary | ICD-10-CM | POA: Diagnosis not present

## 2019-05-31 LAB — BASIC METABOLIC PANEL
Anion gap: 10 (ref 5–15)
BUN: 27 mg/dL — ABNORMAL HIGH (ref 8–23)
CO2: 28 mmol/L (ref 22–32)
Calcium: 9.4 mg/dL (ref 8.9–10.3)
Chloride: 100 mmol/L (ref 98–111)
Creatinine, Ser: 1.39 mg/dL — ABNORMAL HIGH (ref 0.44–1.00)
GFR calc Af Amer: 42 mL/min — ABNORMAL LOW (ref >60)
GFR calc non Af Amer: 36 mL/min — ABNORMAL LOW (ref >60)
Glucose, Bld: 287 mg/dL — ABNORMAL HIGH (ref 70–99)
Potassium: 4.4 mmol/L (ref 3.5–5.1)
Sodium: 138 mmol/L (ref 135–145)

## 2019-06-05 DIAGNOSIS — F331 Major depressive disorder, recurrent, moderate: Secondary | ICD-10-CM | POA: Diagnosis not present

## 2019-06-05 DIAGNOSIS — Z23 Encounter for immunization: Secondary | ICD-10-CM | POA: Diagnosis not present

## 2019-06-05 DIAGNOSIS — F4323 Adjustment disorder with mixed anxiety and depressed mood: Secondary | ICD-10-CM | POA: Diagnosis not present

## 2019-06-06 DIAGNOSIS — Z1159 Encounter for screening for other viral diseases: Secondary | ICD-10-CM | POA: Diagnosis not present

## 2019-06-06 DIAGNOSIS — E1140 Type 2 diabetes mellitus with diabetic neuropathy, unspecified: Secondary | ICD-10-CM | POA: Diagnosis not present

## 2019-06-06 DIAGNOSIS — I5042 Chronic combined systolic (congestive) and diastolic (congestive) heart failure: Secondary | ICD-10-CM | POA: Diagnosis not present

## 2019-06-14 DIAGNOSIS — Z1159 Encounter for screening for other viral diseases: Secondary | ICD-10-CM | POA: Diagnosis not present

## 2019-06-14 DIAGNOSIS — I5042 Chronic combined systolic (congestive) and diastolic (congestive) heart failure: Secondary | ICD-10-CM | POA: Diagnosis not present

## 2019-06-14 DIAGNOSIS — E1140 Type 2 diabetes mellitus with diabetic neuropathy, unspecified: Secondary | ICD-10-CM | POA: Diagnosis not present

## 2019-06-19 DIAGNOSIS — F4323 Adjustment disorder with mixed anxiety and depressed mood: Secondary | ICD-10-CM | POA: Diagnosis not present

## 2019-06-19 DIAGNOSIS — F331 Major depressive disorder, recurrent, moderate: Secondary | ICD-10-CM | POA: Diagnosis not present

## 2019-06-21 DIAGNOSIS — I5042 Chronic combined systolic (congestive) and diastolic (congestive) heart failure: Secondary | ICD-10-CM | POA: Diagnosis not present

## 2019-06-21 DIAGNOSIS — Z1159 Encounter for screening for other viral diseases: Secondary | ICD-10-CM | POA: Diagnosis not present

## 2019-06-21 DIAGNOSIS — E1140 Type 2 diabetes mellitus with diabetic neuropathy, unspecified: Secondary | ICD-10-CM | POA: Diagnosis not present

## 2019-06-26 DIAGNOSIS — F4323 Adjustment disorder with mixed anxiety and depressed mood: Secondary | ICD-10-CM | POA: Diagnosis not present

## 2019-06-26 DIAGNOSIS — F331 Major depressive disorder, recurrent, moderate: Secondary | ICD-10-CM | POA: Diagnosis not present

## 2019-06-28 ENCOUNTER — Encounter: Payer: Self-pay | Admitting: Adult Health

## 2019-06-28 ENCOUNTER — Non-Acute Institutional Stay (SKILLED_NURSING_FACILITY): Payer: Medicare Other | Admitting: Adult Health

## 2019-06-28 DIAGNOSIS — E876 Hypokalemia: Secondary | ICD-10-CM | POA: Diagnosis not present

## 2019-06-28 DIAGNOSIS — R600 Localized edema: Secondary | ICD-10-CM

## 2019-06-28 DIAGNOSIS — I482 Chronic atrial fibrillation, unspecified: Secondary | ICD-10-CM | POA: Diagnosis not present

## 2019-06-28 NOTE — Progress Notes (Signed)
Location:    [facility] Room Number: [id number] Place of Service:  SNF (31) Phillips Grout NP    CODE STATUS: DNR  Allergies  Allergen Reactions  . Codeine Anaphylaxis and Hives  . Morphine And Related Anaphylaxis and Hives  . Penicillins Anaphylaxis  . Ace Inhibitors Cough    Chief Complaint  Patient presents with  . Medical Management of Chronic Issues        Chronic atrial fibrillation:   Bilateral lower extremity edema: Hypokalemia:    HPI:  She is a 79 year old long term resident of this facility being seen for the management of her chronic illnesses: afib; edema; hypokalemia. No reports of uncontrolled pain; no reports of appetite changes; no reports of worsening lower extremity edema.   Past Medical History:  Diagnosis Date  . Allergy   . Atrial fibrillation ([place]) 08/2011   First diagnosed in 08/2011; duration of arrhythmia is uncertain  . Bilateral lower extremity edema   . Chronic diarrhea    diverticulosis  . COPD (chronic obstructive pulmonary disease) ([place])   . Dementia ([place])   . Diabetes mellitus, type 2 (HCC)    Diabetic neuropathy  . Dyspnea on exertion    pedal edema  . Gout   . Headache(784.0)    twice weekly  . Hyperlipidemia   . Hypertension   . Osteopenia    DEXA scan 01/2010  . Palpitations   . Seasonal allergies   . Stress incontinence   . Vertigo     Past Surgical History:  Procedure Laterality Date  . APPENDECTOMY    . BREAST BIOPSY  2002   Right  . CESAREAN SECTION     x 2  . CHOLECYSTECTOMY    . COLONOSCOPY  2007   Negative screening study  . COLONOSCOPY WITH PROPOFOL N/A 09/18/2017   Procedure: COLONOSCOPY WITH PROPOFOL;  Surgeon: Daneil Dolin, MD;  [facility];  Service: Endoscopy;  Laterality: N/A;  11:00am  . HAMMER TOE SURGERY     Bilateral hammer toe amputation  . INCISIONAL HERNIA REPAIR    . KNEE ARTHROSCOPY W/ MENISCAL REPAIR  2007   Bilateral  . POLYPECTOMY  09/18/2017   Procedure:  POLYPECTOMY;  Surgeon: Daneil Dolin, MD;  [facility];  Service: Endoscopy;;  colon  . UMBILICAL HERNIA REPAIR      Social History   Socioeconomic History  . Marital status: Widowed    Spouse name: Not on file  . Number of children: 2  . Years of education: Not on file  . Highest education level: Not on file  Occupational History  . Occupation: Engineer, materials  Tobacco Use  . Smoking status: Never Smoker  . Smokeless tobacco: Never Used  Substance and Sexual Activity  . Alcohol use: No  . Drug use: No  . Sexual activity: Not on file  Other Topics Concern  . Not on file  Social History Narrative  . Not on file   Social Determinants of Health   Financial Resource Strain:   . Difficulty of Paying Living Expenses: Not on file  Food Insecurity:   . Worried About Charity fundraiser in the Last Year: Not on file  . Ran Out of Food in the Last Year: Not on file  Transportation Needs:   . Lack of Transportation (Medical): Not on file  . Lack of Transportation (Non-Medical): Not on file  Physical Activity:   . Days of Exercise  per Week: Not on file  . Minutes of Exercise per Session: Not on file  Stress:   . Feeling of Stress : Not on file  Social Connections:   . Frequency of Communication with Friends and Family: Not on file  . Frequency of Social Gatherings with Friends and Family: Not on file  . Attends Religious Services: Not on file  . Active Member of Clubs or Organizations: Not on file  . Attends Archivist Meetings: Not on file  . Marital Status: Not on file  Intimate Partner Violence:   . Fear of Current or Ex-Partner: Not on file  . Emotionally Abused: Not on file  . Physically Abused: Not on file  . Sexually Abused: Not on file   Family History  Adopted: Yes      VITAL SIGNS BP 110/66   Pulse 74   Temp 98.2 F (36.8 C) (Oral)   Resp 18   Ht 5\' 6"  (1.676 m)   Wt 182 lb (82.6 kg)   SpO2 98%   BMI 29.38 kg/m    Outpatient Encounter Medications as of 06/28/2019  Medication Sig  . acetaminophen (TYLENOL) 325 MG tablet Take 650 mg by mouth every 6 (six) hours as needed.   Marland Kitchen apixaban (ELIQUIS) 5 MG TABS tablet Take 5 mg by mouth 2 (two) times daily.   . carboxymethylcellulose (REFRESH PLUS) 0.5 % SOLN Place 1 drop into both eyes 4 (four) times daily.  . cholecalciferol (VITAMIN D) 1000 UNITS tablet Take 1,000 Units by mouth daily.  Marland Kitchen CRANBERRY FRUIT PO Take by mouth. Give Cranberry Juice, BID Twice A Day  . denosumab (PROLIA) 60 MG/ML SOSY injection Inject 60 mg into the skin every 6 (six) months.  . digoxin (LANOXIN) 0.125 MG tablet Take 1 tablet by mouth once a day (HOLD FOR AP UNDER 60)  . furosemide (LASIX) 40 MG tablet Take 40 mg by mouth daily.   Marland Kitchen glucose blood test strip 1 each by Other route 3 (three) times daily. Notify MD of results under 60 or over 300  . insulin aspart (NOVOLOG) 100 UNIT/ML injection Inject 17 Units into the skin 3 (three) times daily with meals. If BS above 150  . Insulin Glargine (BASAGLAR KWIKPEN [place]) Inject 53 Units into the skin daily.  . memantine (NAMENDA) 10 MG tablet Take 10 mg by mouth 2 (two) times daily.  . metolazone (ZAROXOLYN) 5 MG tablet Take 5 mg by mouth 2 (two) times daily. On Monday and Friday  . metoprolol succinate (TOPROL-XL) 50 MG 24 hr tablet Take 50 mg by mouth 2 (two) times daily. Take with or immediately following a meal.  . NON FORMULARY Diet Type:  NAS, consistent CHO, Dysphagia 3 with thin liquids  Give Cranberry Juice two times daily at 9 am and 5 pm  . omeprazole (PRILOSEC) 40 MG capsule Take 40 mg by mouth daily.  . polyethylene glycol (MIRALAX / GLYCOLAX) packet Take 17 g by mouth daily as needed. daily prn for constipation - mix with 6 oz of liquid and drink  . potassium chloride SA (KLOR-CON) 20 MEQ tablet Take 40 mEq by mouth 3 (three) times daily.  . pravastatin (PRAVACHOL) 20 MG tablet Take 20 mg by mouth daily. Take along with 40 mg  to = 60 mg  . pravastatin (PRAVACHOL) 40 MG tablet Take 40 mg by mouth daily. Take along with 20 mg to = 60 mg  . sitaGLIPtin-metformin (JANUMET) 50-500 MG tablet Take 1 tablet by mouth  2 (two) times daily with a meal.   No facility-administered encounter medications on file as of 06/28/2019.     SIGNIFICANT DIAGNOSTIC EXAMS   PREVIOUS  02-21-19: DEXA: t score -3.504  NO NEW EXAMS.    LABS REVIEWED PREVIOUS:   08-09-18: wbc 10.3 hgb 12.1; hct 39.2 mcv 92.2 ;plt 242; glucose 86 bun 31; creat 1.08; k+ 3.2; na++ 141; ca 9.3; chol 148; ldl 95; trig 107; hdl 32 hgb a1c 7.9 08-23-18: glucose 174; bun 30; creat 0.99; k+ 3.6; na++ 142; ca 9.1  10-09-18: wbc 8.3; hgb 11.9; hct 38.2; mcv 91.6; plt 232; gluocse 203; bun 29; creat 1.06; k+ 3.5;na++ 137; ca 8.2; tsh 2.608; dig 1.0   11-14-18: dig 0.9 12-18-18: hgb a1c 7.8   01-24-19: urine micro-albumin: 5.3   TODAY  04-23-19: wbc 9.6; hgb 13.0; hct 41.4; mcv 90.6 plt 280; glucose 187; bun 37 creat 1.36; k+ 3.3; na++ 139; ca 9.3 ;liver normal albumin 3.2 05-01-19: k+ 4.3 05-09-19: k+ 3.3; vit B 12: 480 05-13-19: k+ 3.8 05-31-19: glucose 287; bun 27; creat 1.39; k+ 4.4; na++ 138; ca 9.4   Review of Systems  Unable to perform ROS: Dementia (unable to participate)   Physical Exam Constitutional:      General: She is not in acute distress.    Appearance: She is well-developed. She is not diaphoretic.  Neck:     Thyroid: No thyromegaly.  Cardiovascular:     Rate and Rhythm: Normal rate. Rhythm irregular.     Pulses: Normal pulses.     Heart sounds: Normal heart sounds.  Pulmonary:     Effort: Pulmonary effort is normal. No respiratory distress.     Breath sounds: Normal breath sounds.  Abdominal:     General: Bowel sounds are normal. There is no distension.     Palpations: Abdomen is soft.     Tenderness: There is no abdominal tenderness.  Musculoskeletal:        General: Normal range of motion.     Cervical back: Neck supple.     Right  lower leg: No edema.     Left lower leg: No edema.  Lymphadenopathy:     Cervical: No cervical adenopathy.  Skin:    General: Skin is warm and dry.  Neurological:     Mental Status: She is alert. Mental status is at baseline.  Psychiatric:        Mood and Affect: Mood normal.      ASSESSMENT/ PLAN:  TODAY:   1. Chronic atrial fibrillation: is stable will continue digoxin 0.125 mcg daily lopressor 50 mg twice daily for rate control is on long term eliquis 5 mg twice daily   2. Bilateral lower extremity edema: is stable will continue lasix 40 mg daily and zaroxolyn 5 mg twice weekly   3. Hypokalemia: is stable k+ 4.4 will continue k+ 40 meq three times daily    PREVIOUS   4. GERD without esophagitis: is stable will continue prilosec 40 mg daily  5. Unspecified dementia with behavioral disturbance: is without change is slowly losing weight: weight is 182  pounds; will continue namenda 10 mg twice daily will monitor .  Weight loss is an unfortunate outcome in later stages of this disease process.   6. 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 Major depression recurrent chronic: is emotionally stable will continue celexa 10 mg daily  8. Type 2 diabetes uncontrolled with periperal circulatory disorder: hgb a1c 7.9 will continue junamet 50/500 twice daily basaglar 50 units nightly and novolog 10 units with breakfast 13 units with lunch and supper.   9. Dyslipidemia associated with type 2 diabetes mellitus: is stable LDL 95 will continue pravachol 60 mg daily   10. CKD stage 2 due to type 2 diabetes mellitus: is stable bun 27; creat 1.39  11. Hypertension; associated with stage 2 chronic disease due to type 2 diabetes mellitus: is stable b/p 110/66 will continue lopressor 50 mg twice daily        MD is aware of resident's narcotic use and is in agreement with current  plan of care. We will attempt to wean resident as appropriate.  Ok Edwards NP [facility]  Contact [phone] Monday through Friday 8am- 5pm  After hours call [phone]

## 2019-07-01 ENCOUNTER — Encounter: Payer: Self-pay | Admitting: Adult Health

## 2019-07-01 NOTE — Progress Notes (Signed)
Location:    [facility] Room Number: 113/W Place of Service:  SNF (31)   CODE STATUS: DNR  Allergies  Allergen Reactions  . Codeine Anaphylaxis and Hives  . Morphine And Related Anaphylaxis and Hives  . Penicillins Anaphylaxis  . Ace Inhibitors Cough    Chief Complaint  Patient presents with  . Acute Visit    Right leg swelling    HPI:    Past Medical History:  Diagnosis Date  . Allergy   . Atrial fibrillation ([place]) 08/2011   First diagnosed in 08/2011; duration of arrhythmia is uncertain  . Bilateral lower extremity edema   . Chronic diarrhea    diverticulosis  . COPD (chronic obstructive pulmonary disease) ([place])   . Dementia ([place])   . Diabetes mellitus, type 2 (HCC)    Diabetic neuropathy  . Dyspnea on exertion    pedal edema  . Gout   . Headache(784.0)    twice weekly  . Hyperlipidemia   . Hypertension   . Osteopenia    DEXA scan 01/2010  . Palpitations   . Seasonal allergies   . Stress incontinence   . Vertigo     Past Surgical History:  Procedure Laterality Date  . APPENDECTOMY    . BREAST BIOPSY  2002   Right  . CESAREAN SECTION     x 2  . CHOLECYSTECTOMY    . COLONOSCOPY  2007   Negative screening study  . COLONOSCOPY WITH PROPOFOL N/A 09/18/2017   Procedure: COLONOSCOPY WITH PROPOFOL;  Surgeon: Daneil Dolin, MD;  [facility];  Service: Endoscopy;  Laterality: N/A;  11:00am  . HAMMER TOE SURGERY     Bilateral hammer toe amputation  . INCISIONAL HERNIA REPAIR    . KNEE ARTHROSCOPY W/ MENISCAL REPAIR  2007   Bilateral  . POLYPECTOMY  09/18/2017   Procedure: POLYPECTOMY;  Surgeon: Daneil Dolin, MD;  [facility];  Service: Endoscopy;;  colon  . UMBILICAL HERNIA REPAIR      Social History   Socioeconomic History  . Marital status: Widowed    Spouse name: Not on file  . Number of children: 2  . Years of education: Not on file  . Highest education level: Not on file  Occupational  History  . Occupation: Engineer, materials  Tobacco Use  . Smoking status: Never Smoker  . Smokeless tobacco: Never Used  Substance and Sexual Activity  . Alcohol use: No  . Drug use: No  . Sexual activity: Not on file  Other Topics Concern  . Not on file  Social History Narrative  . Not on file   Social Determinants of Health   Financial Resource Strain:   . Difficulty of Paying Living Expenses: Not on file  Food Insecurity:   . Worried About Charity fundraiser in the Last Year: Not on file  . Ran Out of Food in the Last Year: Not on file  Transportation Needs:   . Lack of Transportation (Medical): Not on file  . Lack of Transportation (Non-Medical): Not on file  Physical Activity:   . Days of Exercise per Week: Not on file  . Minutes of Exercise per Session: Not on file  Stress:   . Feeling of Stress : Not on file  Social Connections:   . Frequency of Communication with Friends and Family: Not on file  . Frequency of Social Gatherings with Friends and Family: Not on file  . Attends Religious  Services: Not on file  . Active Member of Clubs or Organizations: Not on file  . Attends Archivist Meetings: Not on file  . Marital Status: Not on file  Intimate Partner Violence:   . Fear of Current or Ex-Partner: Not on file  . Emotionally Abused: Not on file  . Physically Abused: Not on file  . Sexually Abused: Not on file   Family History  Adopted: Yes      VITAL SIGNS BP 110/66   Pulse 74   Temp 98.2 F (36.8 C) (Oral)   Resp 18   Ht 5\' 6"  (1.676 m)   Wt 182 lb (82.6 kg)   SpO2 98%   BMI 29.38 kg/m   Outpatient Encounter Medications as of 07/01/2019  Medication Sig  . acetaminophen (TYLENOL) 325 MG tablet Take 650 mg by mouth every 6 (six) hours as needed.   Marland Kitchen apixaban (ELIQUIS) 5 MG TABS tablet Take 5 mg by mouth 2 (two) times daily.   . carboxymethylcellulose (REFRESH PLUS) 0.5 % SOLN Place 1 drop into both eyes 4 (four) times daily.  .  cholecalciferol (VITAMIN D) 1000 UNITS tablet Take 1,000 Units by mouth daily.  Marland Kitchen CRANBERRY FRUIT PO Take by mouth. Give Cranberry Juice, BID Twice A Day  . denosumab (PROLIA) 60 MG/ML SOSY injection Inject 60 mg into the skin every 6 (six) months.  . digoxin (LANOXIN) 0.125 MG tablet Take 1 tablet by mouth once a day (HOLD FOR AP UNDER 60)  . furosemide (LASIX) 40 MG tablet Take 40 mg by mouth daily.   Marland Kitchen glucose blood test strip 1 each by Other route 3 (three) times daily. Notify MD of results under 60 or over 300  . insulin aspart (NOVOLOG) 100 UNIT/ML injection Inject 17 Units into the skin 3 (three) times daily with meals. If BS above 150  . Insulin Glargine (BASAGLAR KWIKPEN [place]) Inject 53 Units into the skin daily.  . memantine (NAMENDA) 10 MG tablet Take 10 mg by mouth 2 (two) times daily.  . metolazone (ZAROXOLYN) 5 MG tablet Take 5 mg by mouth 2 (two) times daily. On Monday and Friday  . metoprolol succinate (TOPROL-XL) 50 MG 24 hr tablet Take 50 mg by mouth 2 (two) times daily. Take with or immediately following a meal.  . NON FORMULARY Diet Type:  NAS, consistent CHO, Dysphagia 3 with thin liquids  Give Cranberry Juice two times daily at 9 am and 5 pm  . omeprazole (PRILOSEC) 40 MG capsule Take 40 mg by mouth daily.  . polyethylene glycol (MIRALAX / GLYCOLAX) packet Take 17 g by mouth daily as needed. daily prn for constipation - mix with 6 oz of liquid and drink  . potassium chloride SA (KLOR-CON) 20 MEQ tablet Take 40 mEq by mouth 3 (three) times daily.  . pravastatin (PRAVACHOL) 20 MG tablet Take 20 mg by mouth daily. Take along with 40 mg to = 60 mg  . pravastatin (PRAVACHOL) 40 MG tablet Take 40 mg by mouth daily. Take along with 20 mg to = 60 mg  . sitaGLIPtin-metformin (JANUMET) 50-500 MG tablet Take 1 tablet by mouth 2 (two) times daily with a meal.   No facility-administered encounter medications on file as of 07/01/2019.     SIGNIFICANT DIAGNOSTIC  EXAMS       ASSESSMENT/ PLAN:    MD is aware of resident's narcotic use and is in agreement with current plan of care. We will attempt to wean resident as appropriate.  Ok Edwards NP [facility]  Contact [phone] Monday through Friday 8am- 5pm  After hours call [phone]

## 2019-07-03 DIAGNOSIS — F4323 Adjustment disorder with mixed anxiety and depressed mood: Secondary | ICD-10-CM | POA: Diagnosis not present

## 2019-07-03 DIAGNOSIS — I5042 Chronic combined systolic (congestive) and diastolic (congestive) heart failure: Secondary | ICD-10-CM | POA: Diagnosis not present

## 2019-07-03 DIAGNOSIS — F331 Major depressive disorder, recurrent, moderate: Secondary | ICD-10-CM | POA: Diagnosis not present

## 2019-07-03 DIAGNOSIS — E1140 Type 2 diabetes mellitus with diabetic neuropathy, unspecified: Secondary | ICD-10-CM | POA: Diagnosis not present

## 2019-07-03 DIAGNOSIS — Z1159 Encounter for screening for other viral diseases: Secondary | ICD-10-CM | POA: Diagnosis not present

## 2019-07-04 ENCOUNTER — Encounter: Payer: Self-pay | Admitting: Adult Health

## 2019-07-04 ENCOUNTER — Non-Acute Institutional Stay (SKILLED_NURSING_FACILITY): Payer: Medicare Other | Admitting: Adult Health

## 2019-07-04 ENCOUNTER — Encounter (HOSPITAL_COMMUNITY)
Admission: RE | Admit: 2019-07-04 | Discharge: 2019-07-04 | Disposition: A | Discharge status: Home / Self Care | Payer: Medicare Other | Source: Skilled Nursing Facility | Attending: Internal Medicine | Admitting: Internal Medicine

## 2019-07-04 DIAGNOSIS — E1140 Type 2 diabetes mellitus with diabetic neuropathy, unspecified: Secondary | ICD-10-CM | POA: Diagnosis not present

## 2019-07-04 DIAGNOSIS — Z741 Need for assistance with personal care: Secondary | ICD-10-CM | POA: Insufficient documentation

## 2019-07-04 DIAGNOSIS — I130 Hypertensive heart and chronic kidney disease with heart failure and stage 1 through stage 4 chronic kidney disease, or unspecified chronic kidney disease: Secondary | ICD-10-CM | POA: Insufficient documentation

## 2019-07-04 DIAGNOSIS — G301 Alzheimer's disease with late onset: Secondary | ICD-10-CM | POA: Diagnosis not present

## 2019-07-04 DIAGNOSIS — I5042 Chronic combined systolic (congestive) and diastolic (congestive) heart failure: Secondary | ICD-10-CM | POA: Diagnosis not present

## 2019-07-04 DIAGNOSIS — F331 Major depressive disorder, recurrent, moderate: Secondary | ICD-10-CM | POA: Diagnosis not present

## 2019-07-04 DIAGNOSIS — E876 Hypokalemia: Secondary | ICD-10-CM | POA: Diagnosis not present

## 2019-07-04 LAB — CBC
HCT: 33.9 % — ABNORMAL LOW (ref 36.0–46.0)
Hemoglobin: 10.7 g/dL — ABNORMAL LOW (ref 12.0–15.0)
MCH: 29.1 pg (ref 26.0–34.0)
MCHC: 31.6 g/dL (ref 30.0–36.0)
MCV: 92.1 fL (ref 80.0–100.0)
Platelets: 266 10*3/uL (ref 150–400)
RBC: 3.68 MIL/uL — ABNORMAL LOW (ref 3.87–5.11)
RDW: 13.6 % (ref 11.5–15.5)
WBC: 8.9 10*3/uL (ref 4.0–10.5)
nRBC: 0 % (ref 0.0–0.2)

## 2019-07-04 LAB — LIPID PANEL
Cholesterol: 129 mg/dL (ref 0–200)
HDL: 22 mg/dL — ABNORMAL LOW (ref >40)
LDL Cholesterol: 82 mg/dL (ref 0–99)
Total CHOL/HDL Ratio: 5.9 RATIO
Triglycerides: 125 mg/dL (ref <150)
VLDL: 25 mg/dL (ref 0–40)

## 2019-07-04 LAB — TSH: TSH: 2.028 u[IU]/mL (ref 0.350–4.500)

## 2019-07-04 LAB — BASIC METABOLIC PANEL
Anion gap: 9 (ref 5–15)
BUN: 33 mg/dL — ABNORMAL HIGH (ref 8–23)
CO2: 29 mmol/L (ref 22–32)
Calcium: 9.2 mg/dL (ref 8.9–10.3)
Chloride: 99 mmol/L (ref 98–111)
Creatinine, Ser: 1.23 mg/dL — ABNORMAL HIGH (ref 0.44–1.00)
GFR calc Af Amer: 48 mL/min — ABNORMAL LOW (ref >60)
GFR calc non Af Amer: 42 mL/min — ABNORMAL LOW (ref >60)
Glucose, Bld: 118 mg/dL — ABNORMAL HIGH (ref 70–99)
Potassium: 3.2 mmol/L — ABNORMAL LOW (ref 3.5–5.1)
Sodium: 137 mmol/L (ref 135–145)

## 2019-07-04 LAB — HEMOGLOBIN A1C
Hgb A1c MFr Bld: 7.1 % — ABNORMAL HIGH (ref 4.8–5.6)
Mean Plasma Glucose: 157.07 mg/dL

## 2019-07-04 NOTE — Progress Notes (Signed)
Location:    [place] Room Number: 113/W Place of Service:  SNF (31)   CODE STATUS: DNR  Allergies  Allergen Reactions  . Codeine Anaphylaxis and Hives  . Morphine And Related Anaphylaxis and Hives  . Penicillins Anaphylaxis  . Ace Inhibitors Cough    Chief Complaint  Patient presents with  . Follow-up    Lab Follow Up, Was patient given the second Covid Shot not in chart    HPI:    Past Medical History:  Diagnosis Date  . Allergy   . Atrial fibrillation ([place]) 08/2011   First diagnosed in 08/2011; duration of arrhythmia is uncertain  . Bilateral lower extremity edema   . Chronic diarrhea    diverticulosis  . COPD (chronic obstructive pulmonary disease) ([place])   . Dementia ([place])   . Diabetes mellitus, type 2 (HCC)    Diabetic neuropathy  . Dyspnea on exertion    pedal edema  . Gout   . Headache(784.0)    twice weekly  . Hyperlipidemia   . Hypertension   . Osteopenia    DEXA scan 01/2010  . Palpitations   . Seasonal allergies   . Stress incontinence   . Vertigo     Past Surgical History:  Procedure Laterality Date  . APPENDECTOMY    . BREAST BIOPSY  2002   Right  . CESAREAN SECTION     x 2  . CHOLECYSTECTOMY    . COLONOSCOPY  2007   Negative screening study  . COLONOSCOPY WITH PROPOFOL N/A 09/18/2017   Procedure: COLONOSCOPY WITH PROPOFOL;  Surgeon: Daneil Dolin, MD;  [facility];  Service: Endoscopy;  Laterality: N/A;  11:00am  . HAMMER TOE SURGERY     Bilateral hammer toe amputation  . INCISIONAL HERNIA REPAIR    . KNEE ARTHROSCOPY W/ MENISCAL REPAIR  2007   Bilateral  . POLYPECTOMY  09/18/2017   Procedure: POLYPECTOMY;  Surgeon: Daneil Dolin, MD;  [facility];  Service: Endoscopy;;  colon  . UMBILICAL HERNIA REPAIR      Social History   Socioeconomic History  . Marital status: Widowed    Spouse name: Not on file  . Number of children: 2  . Years of education: Not on file  . Highest  education level: Not on file  Occupational History  . Occupation: Engineer, materials  Tobacco Use  . Smoking status: Never Smoker  . Smokeless tobacco: Never Used  Substance and Sexual Activity  . Alcohol use: No  . Drug use: No  . Sexual activity: Not on file  Other Topics Concern  . Not on file  Social History Narrative  . Not on file   Social Determinants of Health   Financial Resource Strain:   . Difficulty of Paying Living Expenses: Not on file  Food Insecurity:   . Worried About Charity fundraiser in the Last Year: Not on file  . Ran Out of Food in the Last Year: Not on file  Transportation Needs:   . Lack of Transportation (Medical): Not on file  . Lack of Transportation (Non-Medical): Not on file  Physical Activity:   . Days of Exercise per Week: Not on file  . Minutes of Exercise per Session: Not on file  Stress:   . Feeling of Stress : Not on file  Social Connections:   . Frequency of Communication with Friends and Family: Not on file  . Frequency of Social Gatherings with Friends  and Family: Not on file  . Attends Religious Services: Not on file  . Active Member of Clubs or Organizations: Not on file  . Attends Archivist Meetings: Not on file  . Marital Status: Not on file  Intimate Partner Violence:   . Fear of Current or Ex-Partner: Not on file  . Emotionally Abused: Not on file  . Physically Abused: Not on file  . Sexually Abused: Not on file   Family History  Adopted: Yes      VITAL SIGNS BP 102/67   Pulse 73   Temp 98.1 F (36.7 C) (Oral)   Resp 18   Ht 5\' 6"  (1.676 m)   Wt 176 lb 9.6 oz (80.1 kg)   SpO2 98%   BMI 28.50 kg/m   Outpatient Encounter Medications as of 07/04/2019  Medication Sig  . acetaminophen (TYLENOL) 325 MG tablet Take 650 mg by mouth every 6 (six) hours as needed.   Marland Kitchen apixaban (ELIQUIS) 5 MG TABS tablet Take 5 mg by mouth 2 (two) times daily.   . carboxymethylcellulose (REFRESH PLUS) 0.5 % SOLN Place 1 drop  into both eyes 4 (four) times daily.  . cholecalciferol (VITAMIN D) 1000 UNITS tablet Take 1,000 Units by mouth daily.  Marland Kitchen CRANBERRY FRUIT PO Take by mouth. Give Cranberry Juice, BID Twice A Day  . denosumab (PROLIA) 60 MG/ML SOSY injection Inject 60 mg into the skin every 6 (six) months.  . digoxin (LANOXIN) 0.125 MG tablet Take 1 tablet by mouth once a day (HOLD FOR AP UNDER 60)  . furosemide (LASIX) 40 MG tablet Take 40 mg by mouth daily.   . insulin aspart (NOVOLOG) 100 UNIT/ML injection Inject 17 Units into the skin 3 (three) times daily with meals. If BS above 150  . Insulin Glargine (BASAGLAR KWIKPEN [place]) Inject 53 Units into the skin daily.  . memantine (NAMENDA) 10 MG tablet Take 10 mg by mouth 2 (two) times daily.  . metolazone (ZAROXOLYN) 5 MG tablet Take 5 mg by mouth 2 (two) times daily. On Monday and Friday  . metoprolol succinate (TOPROL-XL) 50 MG 24 hr tablet Take 50 mg by mouth 2 (two) times daily. Take with or immediately following a meal.  . NON FORMULARY Diet Type:  NAS, consistent CHO, Dysphagia 3 with thin liquids  . omeprazole (PRILOSEC) 40 MG capsule Take 40 mg by mouth daily.  . polyethylene glycol (MIRALAX / GLYCOLAX) packet Take 17 g by mouth daily as needed. daily prn for constipation - mix with 6 oz of liquid and drink  . potassium chloride SA (KLOR-CON) 20 MEQ tablet Take 40 mEq by mouth 4 (four) times daily.   . pravastatin (PRAVACHOL) 20 MG tablet Take 20 mg by mouth daily. Take along with 40 mg to = 60 mg  . pravastatin (PRAVACHOL) 40 MG tablet Take 40 mg by mouth daily. Take along with 20 mg to = 60 mg  . sitaGLIPtin-metformin (JANUMET) 50-500 MG tablet Take 1 tablet by mouth 2 (two) times daily with a meal.  . [DISCONTINUED] glucose blood test strip 1 each by Other route 3 (three) times daily. Notify MD of results under 55 or over 300   No facility-administered encounter medications on file as of 07/04/2019.     SIGNIFICANT DIAGNOSTIC  EXAMS  PREVIOUS  02-21-19: DEXA: t score -3.504  NO NEW EXAMS.    LABS REVIEWED PREVIOUS:   08-09-18: wbc 10.3 hgb 12.1; hct 39.2 mcv 92.2 ;plt 242; glucose 86 bun 31;  creat 1.08; k+ 3.2; na++ 141; ca 9.3; chol 148; ldl 95; trig 107; hdl 32 hgb a1c 7.9 08-23-18: glucose 174; bun 30; creat 0.99; k+ 3.6; na++ 142; ca 9.1  10-09-18: wbc 8.3; hgb 11.9; hct 38.2; mcv 91.6; plt 232; gluocse 203; bun 29; creat 1.06; k+ 3.5;na++ 137; ca 8.2; tsh 2.608; dig 1.0   11-14-18: dig 0.9 12-18-18: hgb a1c 7.8   01-24-19: urine micro-albumin: 5.3 04-23-19: wbc 9.6; hgb 13.0; hct 41.4; mcv 90.6 plt 280; glucose 187; bun 37 creat 1.36; k+ 3.3; na++ 139; ca 9.3 ;liver normal albumin 3.2 05-01-19: k+ 4.3 05-09-19: k+ 3.3; vit B 12: 480 05-13-19: k+ 3.8 05-31-19: glucose 287; bun 27; creat 1.39; k+ 4.4; na++ 138; ca 9.4   TODAY  07-04-19: wbc 8.9; hgb 10.7; hct 33.9; mcv 92.1 plt 266; glucose 118; bum 33; creat 1.23; k+ 3.2; na++ 137; ca 9.2; tsh 2.028 hgb a1c 7.1; chol 129 ldl 82 trig 125 hdl 22    Review of Systems  Unable to perform ROS: Dementia (unable to participate )   Physical Exam Constitutional:      General: She is not in acute distress.    Appearance: She is well-developed. She is not diaphoretic.  Neck:     Thyroid: No thyromegaly.  Cardiovascular:     Rate and Rhythm: Normal rate and regular rhythm.     Pulses: Normal pulses.     Heart sounds: Normal heart sounds.  Pulmonary:     Effort: Pulmonary effort is normal. No respiratory distress.     Breath sounds: Normal breath sounds.  Abdominal:     General: Bowel sounds are normal. There is no distension.     Palpations: Abdomen is soft.     Tenderness: There is no abdominal tenderness.  Musculoskeletal:        General: Normal range of motion.     Cervical back: Neck supple.     Right lower leg: No edema.     Left lower leg: No edema.  Lymphadenopathy:     Cervical: No cervical adenopathy.  Skin:    General: Skin is warm and dry.   Neurological:     Mental Status: She is alert. Mental status is at baseline.  Psychiatric:        Mood and Affect: Mood normal.        ASSESSMENT/ PLAN:  TODAY  1. Hypokalemia: is worse will incease k+ to 40 meq four times daily and will repeat k+ on 07-11-19.   MD is aware of resident's narcotic use and is in agreement with current plan of care. We will attempt to wean resident as appropriate.  Ok Edwards NP [facility]  Contact [phone] Monday through Friday 8am- 5pm  After hours call [phone]

## 2019-07-06 NOTE — Progress Notes (Signed)
This encounter was created in error - please disregard.

## 2019-07-10 DIAGNOSIS — F331 Major depressive disorder, recurrent, moderate: Secondary | ICD-10-CM | POA: Diagnosis not present

## 2019-07-10 DIAGNOSIS — F4323 Adjustment disorder with mixed anxiety and depressed mood: Secondary | ICD-10-CM | POA: Diagnosis not present

## 2019-07-11 ENCOUNTER — Encounter: Payer: Self-pay | Admitting: Adult Health

## 2019-07-11 ENCOUNTER — Other Ambulatory Visit (HOSPITAL_COMMUNITY)
Admission: RE | Admit: 2019-07-11 | Discharge: 2019-07-11 | Disposition: A | Discharge status: Home / Self Care | Payer: Medicare Other | Source: Skilled Nursing Facility | Attending: Adult Health | Admitting: Adult Health

## 2019-07-11 ENCOUNTER — Non-Acute Institutional Stay (SKILLED_NURSING_FACILITY): Payer: Medicare Other | Admitting: Adult Health

## 2019-07-11 DIAGNOSIS — E876 Hypokalemia: Secondary | ICD-10-CM

## 2019-07-11 DIAGNOSIS — R600 Localized edema: Secondary | ICD-10-CM

## 2019-07-11 DIAGNOSIS — E1140 Type 2 diabetes mellitus with diabetic neuropathy, unspecified: Secondary | ICD-10-CM | POA: Diagnosis not present

## 2019-07-11 DIAGNOSIS — I5042 Chronic combined systolic (congestive) and diastolic (congestive) heart failure: Secondary | ICD-10-CM | POA: Diagnosis not present

## 2019-07-11 DIAGNOSIS — Z1159 Encounter for screening for other viral diseases: Secondary | ICD-10-CM | POA: Diagnosis not present

## 2019-07-11 LAB — POTASSIUM: Potassium: 3.3 mmol/L — ABNORMAL LOW (ref 3.5–5.1)

## 2019-07-11 NOTE — Progress Notes (Signed)
Location:    [facility] Number: 113/W Place of Service:  SNF (31)   CODE STATUS: DNR  Allergies  Allergen Reactions  . Codeine Anaphylaxis and Hives  . Morphine And Related Anaphylaxis and Hives  . Penicillins Anaphylaxis  . Ace Inhibitors Cough    Chief Complaint  Patient presents with  . Follow-up    Follow Up Labs    HPI:  Her k+ is 3.3 and she is presently taking 40 meq k+ four times daily. She is presently taking lasix 40 mg daily. I have spoken with Dr. Sheppard Coil regarding her lab result. We ned to increase k+ level. Will alter her diuretics to better manage her electrolytes. There are no reports of uncontrolled pain; no shortness of breath; no cough. There are no reports of uncontrolled pain.   Past Medical History:  Diagnosis Date  . Allergy   . Atrial fibrillation ([place]) 08/2011   First diagnosed in 08/2011; duration of arrhythmia is uncertain  . Bilateral lower extremity edema   . Chronic diarrhea    diverticulosis  . COPD (chronic obstructive pulmonary disease) ([place])   . Dementia ([place])   . Diabetes mellitus, type 2 (HCC)    Diabetic neuropathy  . Dyspnea on exertion    pedal edema  . Gout   . Headache(784.0)    twice weekly  . Hyperlipidemia   . Hypertension   . Osteopenia    DEXA scan 01/2010  . Palpitations   . Seasonal allergies   . Stress incontinence   . Vertigo     Past Surgical History:  Procedure Laterality Date  . APPENDECTOMY    . BREAST BIOPSY  2002   Right  . CESAREAN SECTION     x 2  . CHOLECYSTECTOMY    . COLONOSCOPY  2007   Negative screening study  . COLONOSCOPY WITH PROPOFOL N/A 09/18/2017   Procedure: COLONOSCOPY WITH PROPOFOL;  Surgeon: Daneil Dolin, MD;  [facility];  Service: Endoscopy;  Laterality: N/A;  11:00am  . HAMMER TOE SURGERY     Bilateral hammer toe amputation  . INCISIONAL HERNIA REPAIR    . KNEE ARTHROSCOPY W/ MENISCAL REPAIR  2007   Bilateral  . POLYPECTOMY   09/18/2017   Procedure: POLYPECTOMY;  Surgeon: Daneil Dolin, MD;  Location[facility];  Service: Endoscopy;;  colon  . UMBILICAL HERNIA REPAIR      Social History   Socioeconomic History  . Marital status: Widowed    Spouse name: Not on file  . Number of children: 2  . Years of education: Not on file  . Highest education level: Not on file  Occupational History  . Occupation: Engineer, materials  Tobacco Use  . Smoking status: Never Smoker  . Smokeless tobacco: Never Used  Substance and Sexual Activity  . Alcohol use: No  . Drug use: No  . Sexual activity: Not on file  Other Topics Concern  . Not on file  Social History Narrative  . Not on file   Social Determinants of Health   Financial Resource Strain:   . Difficulty of Paying Living Expenses:   Food Insecurity:   . Worried About Charity fundraiser in the Last Year:   . Arboriculturist in the Last Year:   Transportation Needs:   . Film/video editor (Medical):   Marland Kitchen Lack of Transportation (Non-Medical):   Physical Activity:   . Days of Exercise per Week:   .  Minutes of Exercise per Session:   Stress:   . Feeling of Stress :   Social Connections:   . Frequency of Communication with Friends and Family:   . Frequency of Social Gatherings with Friends and Family:   . Attends Religious Services:   . Active Member of Clubs or Organizations:   . Attends Archivist Meetings:   Marland Kitchen Marital Status:   Intimate Partner Violence:   . Fear of Current or Ex-Partner:   . Emotionally Abused:   Marland Kitchen Physically Abused:   . Sexually Abused:    Family History  Adopted: Yes      VITAL SIGNS BP 116/72   Pulse 74   Temp 98.6 F (37 C) (Oral)   Resp 18   Ht 5\' 6"  (1.676 m)   Wt 176 lb 9.6 oz (80.1 kg)   SpO2 98%   BMI 28.50 kg/m   Outpatient Encounter Medications as of 07/11/2019  Medication Sig  . acetaminophen (TYLENOL) 325 MG tablet Take 650 mg by mouth every 6 (six) hours as needed.   Marland Kitchen apixaban  (ELIQUIS) 5 MG TABS tablet Take 5 mg by mouth 2 (two) times daily.   . carboxymethylcellulose (REFRESH PLUS) 0.5 % SOLN Place 1 drop into both eyes 4 (four) times daily.  . cholecalciferol (VITAMIN D) 1000 UNITS tablet Take 1,000 Units by mouth daily.  Marland Kitchen CRANBERRY FRUIT PO Take by mouth. Give Cranberry Juice, BID Twice A Day  . denosumab (PROLIA) 60 MG/ML SOSY injection Inject 60 mg into the skin every 6 (six) months.  . digoxin (LANOXIN) 0.125 MG tablet Take 1 tablet by mouth once a day (HOLD FOR AP UNDER 60)  . furosemide (LASIX) 40 MG tablet Take 20 mg by mouth daily.   . insulin aspart (NOVOLOG) 100 UNIT/ML injection Inject 17 Units into the skin 3 (three) times daily with meals. If BS above 150  . Insulin Glargine (BASAGLAR KWIKPEN [place]) Inject 53 Units into the skin daily.  . memantine (NAMENDA) 10 MG tablet Take 10 mg by mouth 2 (two) times daily.  . metolazone (ZAROXOLYN) 5 MG tablet Take 5 mg by mouth 2 (two) times daily. On Monday and Friday  . metoprolol succinate (TOPROL-XL) 50 MG 24 hr tablet Take 50 mg by mouth 2 (two) times daily. Take with or immediately following a meal.  . NON FORMULARY Diet Type:  NAS, consistent CHO, Dysphagia 3 with thin liquids  . omeprazole (PRILOSEC) 40 MG capsule Take 40 mg by mouth daily.  . polyethylene glycol (MIRALAX / GLYCOLAX) packet Take 17 g by mouth daily as needed. daily prn for constipation - mix with 6 oz of liquid and drink  . potassium chloride SA (KLOR-CON) 20 MEQ tablet Take 40 mEq by mouth 4 (four) times daily.   . pravastatin (PRAVACHOL) 20 MG tablet Take 20 mg by mouth daily. Take along with 40 mg to = 60 mg  . pravastatin (PRAVACHOL) 40 MG tablet Take 40 mg by mouth daily. Take along with 20 mg to = 60 mg  . sitaGLIPtin-metformin (JANUMET) 50-500 MG tablet Take 1 tablet by mouth 2 (two) times daily with a meal.   No facility-administered encounter medications on file as of 07/11/2019.     SIGNIFICANT DIAGNOSTIC  EXAMS   PREVIOUS  02-21-19: DEXA: t score -3.504  NO NEW EXAMS.    LABS REVIEWED PREVIOUS:   08-09-18: wbc 10.3 hgb 12.1; hct 39.2 mcv 92.2 ;plt 242; glucose 86 bun 31; creat 1.08; k+ 3.2;  na++ 141; ca 9.3; chol 148; ldl 95; trig 107; hdl 32 hgb a1c 7.9 08-23-18: glucose 174; bun 30; creat 0.99; k+ 3.6; na++ 142; ca 9.1  10-09-18: wbc 8.3; hgb 11.9; hct 38.2; mcv 91.6; plt 232; gluocse 203; bun 29; creat 1.06; k+ 3.5;na++ 137; ca 8.2; tsh 2.608; dig 1.0   11-14-18: dig 0.9 12-18-18: hgb a1c 7.8   01-24-19: urine micro-albumin: 5.3 04-23-19: wbc 9.6; hgb 13.0; hct 41.4; mcv 90.6 plt 280; glucose 187; bun 37 creat 1.36; k+ 3.3; na++ 139; ca 9.3 ;liver normal albumin 3.2 05-01-19: k+ 4.3 05-09-19: k+ 3.3; vit B 12: 480 05-13-19: k+ 3.8 05-31-19: glucose 287; bun 27; creat 1.39; k+ 4.4; na++ 138; ca 9.4   TODAY  07-04-19: wbc 8.9; hgb 10.7; hct 33.9; mcv 92.1 plt 266; glucose 118; bum 33; creat 1.23; k+ 3.2; na++ 137; ca 9.2; tsh 2.028 hgb a1c 7.1; chol 129 ldl 82 trig 125 hdl 22  07-11-19 k+ 3.3  Review of Systems  Unable to perform ROS: Dementia (unable to participate)   Physical Exam Constitutional:      General: She is not in acute distress.    Appearance: She is well-developed. She is not diaphoretic.  Neck:     Thyroid: No thyromegaly.  Cardiovascular:     Rate and Rhythm: Normal rate and regular rhythm.     Pulses: Normal pulses.     Heart sounds: Normal heart sounds.  Pulmonary:     Effort: Pulmonary effort is normal. No respiratory distress.     Breath sounds: Normal breath sounds.  Abdominal:     General: Bowel sounds are normal. There is no distension.     Palpations: Abdomen is soft.     Tenderness: There is no abdominal tenderness.  Musculoskeletal:        General: Normal range of motion.     Cervical back: Neck supple.     Right lower leg: No edema.     Left lower leg: No edema.  Lymphadenopathy:     Cervical: No cervical adenopathy.  Skin:    General: Skin is warm  and dry.  Neurological:     Mental Status: She is alert. Mental status is at baseline.  Psychiatric:        Mood and Affect: Mood normal.        ASSESSMENT/ PLAN:  TODAY  1. Bilateral leg edema 2. Hypokalemia Will change lasix to 20 mg daily  Will begin spironolactone 12.5 mg daily  Will check BMP 07-15-19    MD is aware of resident's narcotic use and is in agreement with current plan of care. We will attempt to wean resident as appropriate.  Ok Edwards NP [facility]  Contact [phone] Monday through Friday 8am- 5pm  After hours call [phone]

## 2019-07-16 ENCOUNTER — Other Ambulatory Visit (HOSPITAL_COMMUNITY)
Admission: RE | Admit: 2019-07-16 | Discharge: 2019-07-16 | Disposition: A | Discharge status: Home / Self Care | Payer: Medicare Other | Source: Skilled Nursing Facility | Attending: Adult Health | Admitting: Adult Health

## 2019-07-16 DIAGNOSIS — I130 Hypertensive heart and chronic kidney disease with heart failure and stage 1 through stage 4 chronic kidney disease, or unspecified chronic kidney disease: Secondary | ICD-10-CM | POA: Diagnosis not present

## 2019-07-16 LAB — BASIC METABOLIC PANEL
Anion gap: 7 (ref 5–15)
BUN: 26 mg/dL — ABNORMAL HIGH (ref 8–23)
CO2: 28 mmol/L (ref 22–32)
Calcium: 9.3 mg/dL (ref 8.9–10.3)
Chloride: 104 mmol/L (ref 98–111)
Creatinine, Ser: 1.15 mg/dL — ABNORMAL HIGH (ref 0.44–1.00)
GFR calc Af Amer: 52 mL/min — ABNORMAL LOW (ref >60)
GFR calc non Af Amer: 45 mL/min — ABNORMAL LOW (ref >60)
Glucose, Bld: 112 mg/dL — ABNORMAL HIGH (ref 70–99)
Potassium: 4.2 mmol/L (ref 3.5–5.1)
Sodium: 139 mmol/L (ref 135–145)

## 2019-07-17 ENCOUNTER — Non-Acute Institutional Stay (SKILLED_NURSING_FACILITY): Payer: Medicare Other | Admitting: Adult Health

## 2019-07-17 ENCOUNTER — Encounter: Payer: Self-pay | Admitting: Adult Health

## 2019-07-17 DIAGNOSIS — N6310 Unspecified lump in the right breast, unspecified quadrant: Secondary | ICD-10-CM

## 2019-07-17 DIAGNOSIS — Z1159 Encounter for screening for other viral diseases: Secondary | ICD-10-CM | POA: Diagnosis not present

## 2019-07-17 DIAGNOSIS — E1140 Type 2 diabetes mellitus with diabetic neuropathy, unspecified: Secondary | ICD-10-CM | POA: Diagnosis not present

## 2019-07-17 DIAGNOSIS — F339 Major depressive disorder, recurrent, unspecified: Secondary | ICD-10-CM | POA: Diagnosis not present

## 2019-07-17 DIAGNOSIS — F0391 Unspecified dementia with behavioral disturbance: Secondary | ICD-10-CM

## 2019-07-17 DIAGNOSIS — I482 Chronic atrial fibrillation, unspecified: Secondary | ICD-10-CM

## 2019-07-17 DIAGNOSIS — I5042 Chronic combined systolic (congestive) and diastolic (congestive) heart failure: Secondary | ICD-10-CM | POA: Diagnosis not present

## 2019-07-17 NOTE — Progress Notes (Signed)
Location:    [facility] Room Number: 113/W Place of Service:  SNF (31)   CODE STATUS: DNR  Allergies  Allergen Reactions  . Codeine Sulfate [Codeine] Anaphylaxis and Hives  . Morphine And Related Anaphylaxis and Hives  . Penicillins Anaphylaxis  . Ace Inhibitors Cough    Chief Complaint  Patient presents with  . Acute Visit    Care Plan    HPI:  We have come together for her care plan meeting.  Unable to do BIMS mood 6/30. She has had fall X2 with an abrasion. She has lost 7 pounds in the past 3 months. There are no reports of uncontrolled pain. She has a large right breast mass present which is tender to touch. She will be unable to participate in a mammogram due to her poor cognition. She continues to be followed for her chronic illnesses including: afib dementia; depression.   Past Medical History:  Diagnosis Date  . Allergy   . Atrial fibrillation ([place]) 08/2011   First diagnosed in 08/2011; duration of arrhythmia is uncertain  . Bilateral lower extremity edema   . Chronic diarrhea    diverticulosis  . COPD (chronic obstructive pulmonary disease) ([place])   . Dementia ([place])   . Diabetes mellitus, type 2 (HCC)    Diabetic neuropathy  . Dyspnea on exertion    pedal edema  . Gout   . Headache(784.0)    twice weekly  . Hyperlipidemia   . Hypertension   . Osteopenia    DEXA scan 01/2010  . Palpitations   . Seasonal allergies   . Stress incontinence   . Vertigo     Past Surgical History:  Procedure Laterality Date  . APPENDECTOMY    . BREAST BIOPSY  2002   Right  . CESAREAN SECTION     x 2  . CHOLECYSTECTOMY    . COLONOSCOPY  2007   Negative screening study  . COLONOSCOPY WITH PROPOFOL N/A 09/18/2017   Procedure: COLONOSCOPY WITH PROPOFOL;  Surgeon: Daneil Dolin, MD;  [facility];  Service: Endoscopy;  Laterality: N/A;  11:00am  . HAMMER TOE SURGERY     Bilateral hammer toe amputation  . INCISIONAL HERNIA REPAIR    .  KNEE ARTHROSCOPY W/ MENISCAL REPAIR  2007   Bilateral  . POLYPECTOMY  09/18/2017   Procedure: POLYPECTOMY;  Surgeon: Daneil Dolin, MD;  [facility];  Service: Endoscopy;;  colon  . UMBILICAL HERNIA REPAIR      Social History   Socioeconomic History  . Marital status: Widowed    Spouse name: Not on file  . Number of children: 2  . Years of education: Not on file  . Highest education level: Not on file  Occupational History  . Occupation: Engineer, materials  Tobacco Use  . Smoking status: Never Smoker  . Smokeless tobacco: Never Used  Substance and Sexual Activity  . Alcohol use: No  . Drug use: No  . Sexual activity: Not on file  Other Topics Concern  . Not on file  Social History Narrative  . Not on file   Social Determinants of Health   Financial Resource Strain:   . Difficulty of Paying Living Expenses:   Food Insecurity:   . Worried About Charity fundraiser in the Last Year:   . Arboriculturist in the Last Year:   Transportation Needs:   . Film/video editor (Medical):   Marland Kitchen Lack of  Transportation (Non-Medical):   Physical Activity:   . Days of Exercise per Week:   . Minutes of Exercise per Session:   Stress:   . Feeling of Stress :   Social Connections:   . Frequency of Communication with Friends and Family:   . Frequency of Social Gatherings with Friends and Family:   . Attends Religious Services:   . Active Member of Clubs or Organizations:   . Attends Archivist Meetings:   Marland Kitchen Marital Status:   Intimate Partner Violence:   . Fear of Current or Ex-Partner:   . Emotionally Abused:   Marland Kitchen Physically Abused:   . Sexually Abused:    Family History  Adopted: Yes      VITAL SIGNS BP 110/66   Pulse 78   Temp 99 F (37.2 C) (Oral)   Resp 18   Ht 5\' 6"  (1.676 m)   Wt 176 lb 9.6 oz (80.1 kg)   SpO2 98%   BMI 28.50 kg/m   Outpatient Encounter Medications as of 07/17/2019  Medication Sig  . acetaminophen (TYLENOL) 325 MG  tablet Take 650 mg by mouth every 6 (six) hours as needed.   Marland Kitchen apixaban (ELIQUIS) 5 MG TABS tablet Take 5 mg by mouth 2 (two) times daily.   . carboxymethylcellulose (REFRESH PLUS) 0.5 % SOLN Place 1 drop into both eyes 4 (four) times daily.  . cholecalciferol (VITAMIN D) 1000 UNITS tablet Take 1,000 Units by mouth daily.  Marland Kitchen CRANBERRY FRUIT PO Take by mouth. Give Cranberry Juice, BID Twice A Day  . denosumab (PROLIA) 60 MG/ML SOSY injection Inject 60 mg into the skin every 6 (six) months.  . digoxin (LANOXIN) 0.125 MG tablet Take 1 tablet by mouth once a day (HOLD FOR AP UNDER 60)  . furosemide (LASIX) 20 MG tablet Take 20 mg by mouth daily.  . insulin aspart (NOVOLOG) 100 UNIT/ML injection Inject 17 Units into the skin 3 (three) times daily with meals. If BS above 150  . Insulin Glargine (BASAGLAR KWIKPEN [place]) Inject 53 Units into the skin daily.  . memantine (NAMENDA) 10 MG tablet Take 10 mg by mouth 2 (two) times daily.  . metolazone (ZAROXOLYN) 5 MG tablet Take 5 mg by mouth daily. On Monday and Friday   . metoprolol tartrate (LOPRESSOR) 50 MG tablet Take 50 mg by mouth 2 (two) times daily.  . NON FORMULARY Diet Type:  NAS, consistent CHO, Dysphagia 3 with thin liquids  . omeprazole (PRILOSEC) 40 MG capsule Take 40 mg by mouth daily.  . polyethylene glycol (MIRALAX / GLYCOLAX) packet Take 17 g by mouth daily as needed. daily prn for constipation - mix with 6 oz of liquid and drink  . potassium chloride SA (KLOR-CON) 20 MEQ tablet Take 40 mEq by mouth 4 (four) times daily.   . pravastatin (PRAVACHOL) 20 MG tablet Take 20 mg by mouth daily. Take along with 40 mg to = 60 mg  . pravastatin (PRAVACHOL) 40 MG tablet Take 40 mg by mouth daily. Take along with 20 mg to = 60 mg  . sitaGLIPtin-metformin (JANUMET) 50-500 MG tablet Take 1 tablet by mouth 2 (two) times daily with a meal.  . spironolactone (ALDACTONE) 25 MG tablet Take 12.5 mg by mouth daily. 1/2 Tablet = 12.5mg   . [DISCONTINUED]  furosemide (LASIX) 40 MG tablet Take 20 mg by mouth daily.   . [DISCONTINUED] metoprolol succinate (TOPROL-XL) 50 MG 24 hr tablet Take 50 mg by mouth 2 (two) times daily. Take  with or immediately following a meal.   No facility-administered encounter medications on file as of 07/17/2019.     SIGNIFICANT DIAGNOSTIC EXAMS   PREVIOUS  02-21-19: DEXA: t score -3.504  NO NEW EXAMS.    LABS REVIEWED PREVIOUS:   08-09-18: wbc 10.3 hgb 12.1; hct 39.2 mcv 92.2 ;plt 242; glucose 86 bun 31; creat 1.08; k+ 3.2; na++ 141; ca 9.3; chol 148; ldl 95; trig 107; hdl 32 hgb a1c 7.9 08-23-18: glucose 174; bun 30; creat 0.99; k+ 3.6; na++ 142; ca 9.1  10-09-18: wbc 8.3; hgb 11.9; hct 38.2; mcv 91.6; plt 232; gluocse 203; bun 29; creat 1.06; k+ 3.5;na++ 137; ca 8.2; tsh 2.608; dig 1.0   11-14-18: dig 0.9 12-18-18: hgb a1c 7.8   01-24-19: urine micro-albumin: 5.3 04-23-19: wbc 9.6; hgb 13.0; hct 41.4; mcv 90.6 plt 280; glucose 187; bun 37 creat 1.36; k+ 3.3; na++ 139; ca 9.3 ;liver normal albumin 3.2 05-01-19: k+ 4.3 05-09-19: k+ 3.3; vit B 12: 480 05-13-19: k+ 3.8 05-31-19: glucose 287; bun 27; creat 1.39; k+ 4.4; na++ 138; ca 9.4  07-04-19: wbc 8.9; hgb 10.7; hct 33.9; mcv 92.1 plt 266; glucose 118; bum 33; creat 1.23; k+ 3.2; na++ 137; ca 9.2; tsh 2.028 hgb a1c 7.1; chol 129 ldl 82 trig 125 hdl 22  07-11-19 k+ 3.3  TODAY  07-16-19: glucose 112; bun 26; creat 1.15; k+ 4.2; na++ 139; ca 9.3   Review of Systems  Unable to perform ROS: Dementia (unable to participate )    Physical Exam Constitutional:      General: She is not in acute distress.    Appearance: She is well-developed. She is not diaphoretic.  Neck:     Thyroid: No thyromegaly.  Cardiovascular:     Rate and Rhythm: Normal rate and regular rhythm.     Pulses: Normal pulses.     Heart sounds: Normal heart sounds.  Pulmonary:     Effort: Pulmonary effort is normal. No respiratory distress.     Breath sounds: Normal breath sounds.  Chest:      Comments: Right upper breast mass with tenderness to palpation; immobile  Abdominal:     General: Bowel sounds are normal. There is no distension.     Palpations: Abdomen is soft.     Tenderness: There is no abdominal tenderness.  Musculoskeletal:        General: Normal range of motion.     Cervical back: Neck supple.     Right lower leg: No edema.     Left lower leg: No edema.  Lymphadenopathy:     Cervical: No cervical adenopathy.  Skin:    General: Skin is warm and dry.  Neurological:     Mental Status: She is alert. Mental status is at baseline.  Psychiatric:        Mood and Affect: Mood normal.        ASSESSMENT/ PLAN:  TODAY  1. Chronic atrial fibrillation 2. Dementia with behavioral disturbance unspecified dementia type 3. Chronic depression major recurrent 4. Right breast mass  Will setup an ultrasound Will continue current medications Will continue to monitor his status.      MD is aware of resident's narcotic use and is in agreement with current plan of care. We will attempt to wean resident as appropriate.  Ok Edwards NP [facility]  Contact [phone] Monday through Friday 8am- 5pm  After hours call [phone]

## 2019-07-18 DIAGNOSIS — N6311 Unspecified lump in the right breast, upper outer quadrant: Secondary | ICD-10-CM | POA: Diagnosis not present

## 2019-07-18 DIAGNOSIS — R937 Abnormal findings on diagnostic imaging of other parts of musculoskeletal system: Secondary | ICD-10-CM | POA: Diagnosis not present

## 2019-07-24 ENCOUNTER — Encounter: Payer: Self-pay | Admitting: Internal Medicine

## 2019-07-24 ENCOUNTER — Non-Acute Institutional Stay (SKILLED_NURSING_FACILITY): Payer: Medicare Other | Admitting: Internal Medicine

## 2019-07-24 DIAGNOSIS — N182 Chronic kidney disease, stage 2 (mild): Secondary | ICD-10-CM | POA: Diagnosis not present

## 2019-07-24 DIAGNOSIS — F331 Major depressive disorder, recurrent, moderate: Secondary | ICD-10-CM | POA: Diagnosis not present

## 2019-07-24 DIAGNOSIS — E876 Hypokalemia: Secondary | ICD-10-CM

## 2019-07-24 DIAGNOSIS — F339 Major depressive disorder, recurrent, unspecified: Secondary | ICD-10-CM | POA: Diagnosis not present

## 2019-07-24 DIAGNOSIS — N6310 Unspecified lump in the right breast, unspecified quadrant: Secondary | ICD-10-CM | POA: Insufficient documentation

## 2019-07-24 DIAGNOSIS — E1122 Type 2 diabetes mellitus with diabetic chronic kidney disease: Secondary | ICD-10-CM | POA: Diagnosis not present

## 2019-07-24 DIAGNOSIS — I129 Hypertensive chronic kidney disease with stage 1 through stage 4 chronic kidney disease, or unspecified chronic kidney disease: Secondary | ICD-10-CM | POA: Diagnosis not present

## 2019-07-24 DIAGNOSIS — F4323 Adjustment disorder with mixed anxiety and depressed mood: Secondary | ICD-10-CM | POA: Diagnosis not present

## 2019-07-24 IMAGING — DX CHEST - 2 VIEW
2 series · 2 of 2 positions shown · non-contrast
Comparison: Chest x-rays dated 01/13/2016 and 02/05/2015.

CLINICAL DATA: Low O2 sats today.

EXAM:
CHEST - 2 VIEW

[chest pa]
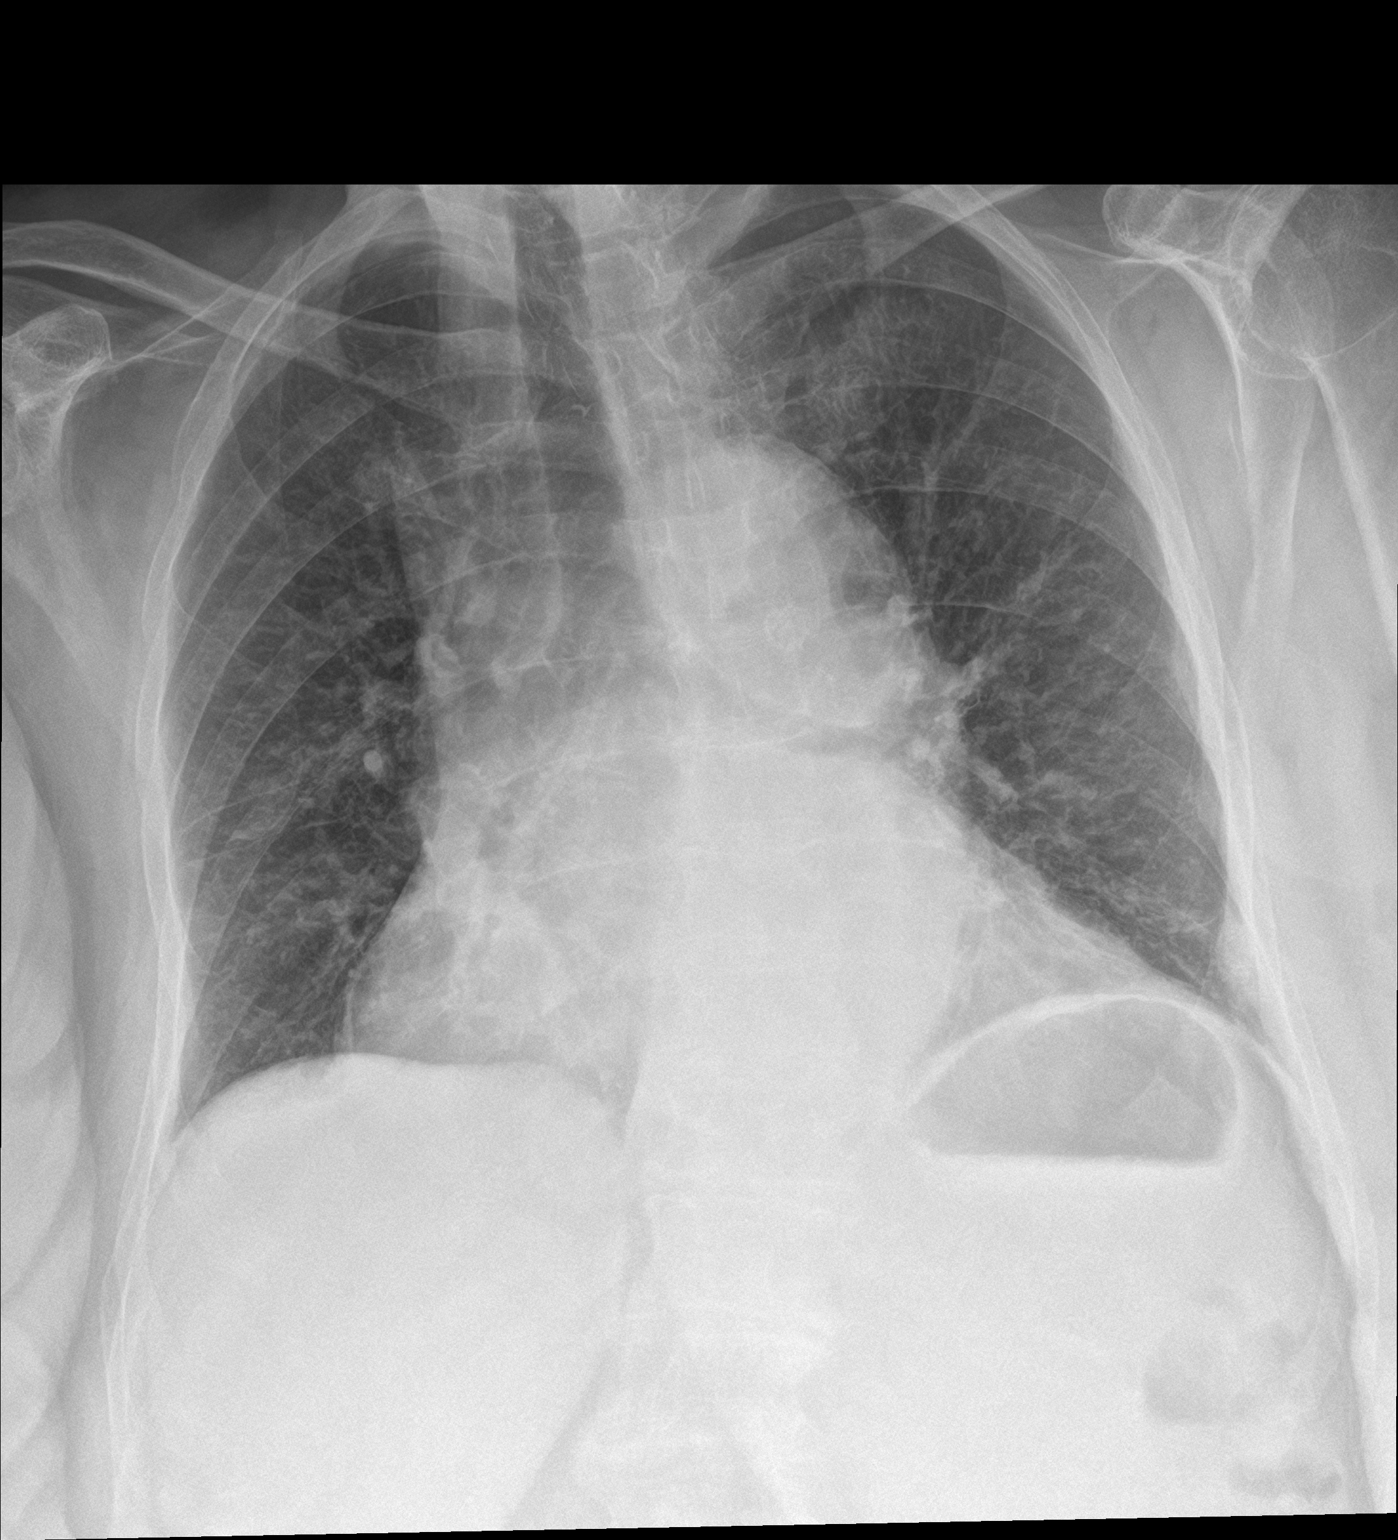

[chest lat]
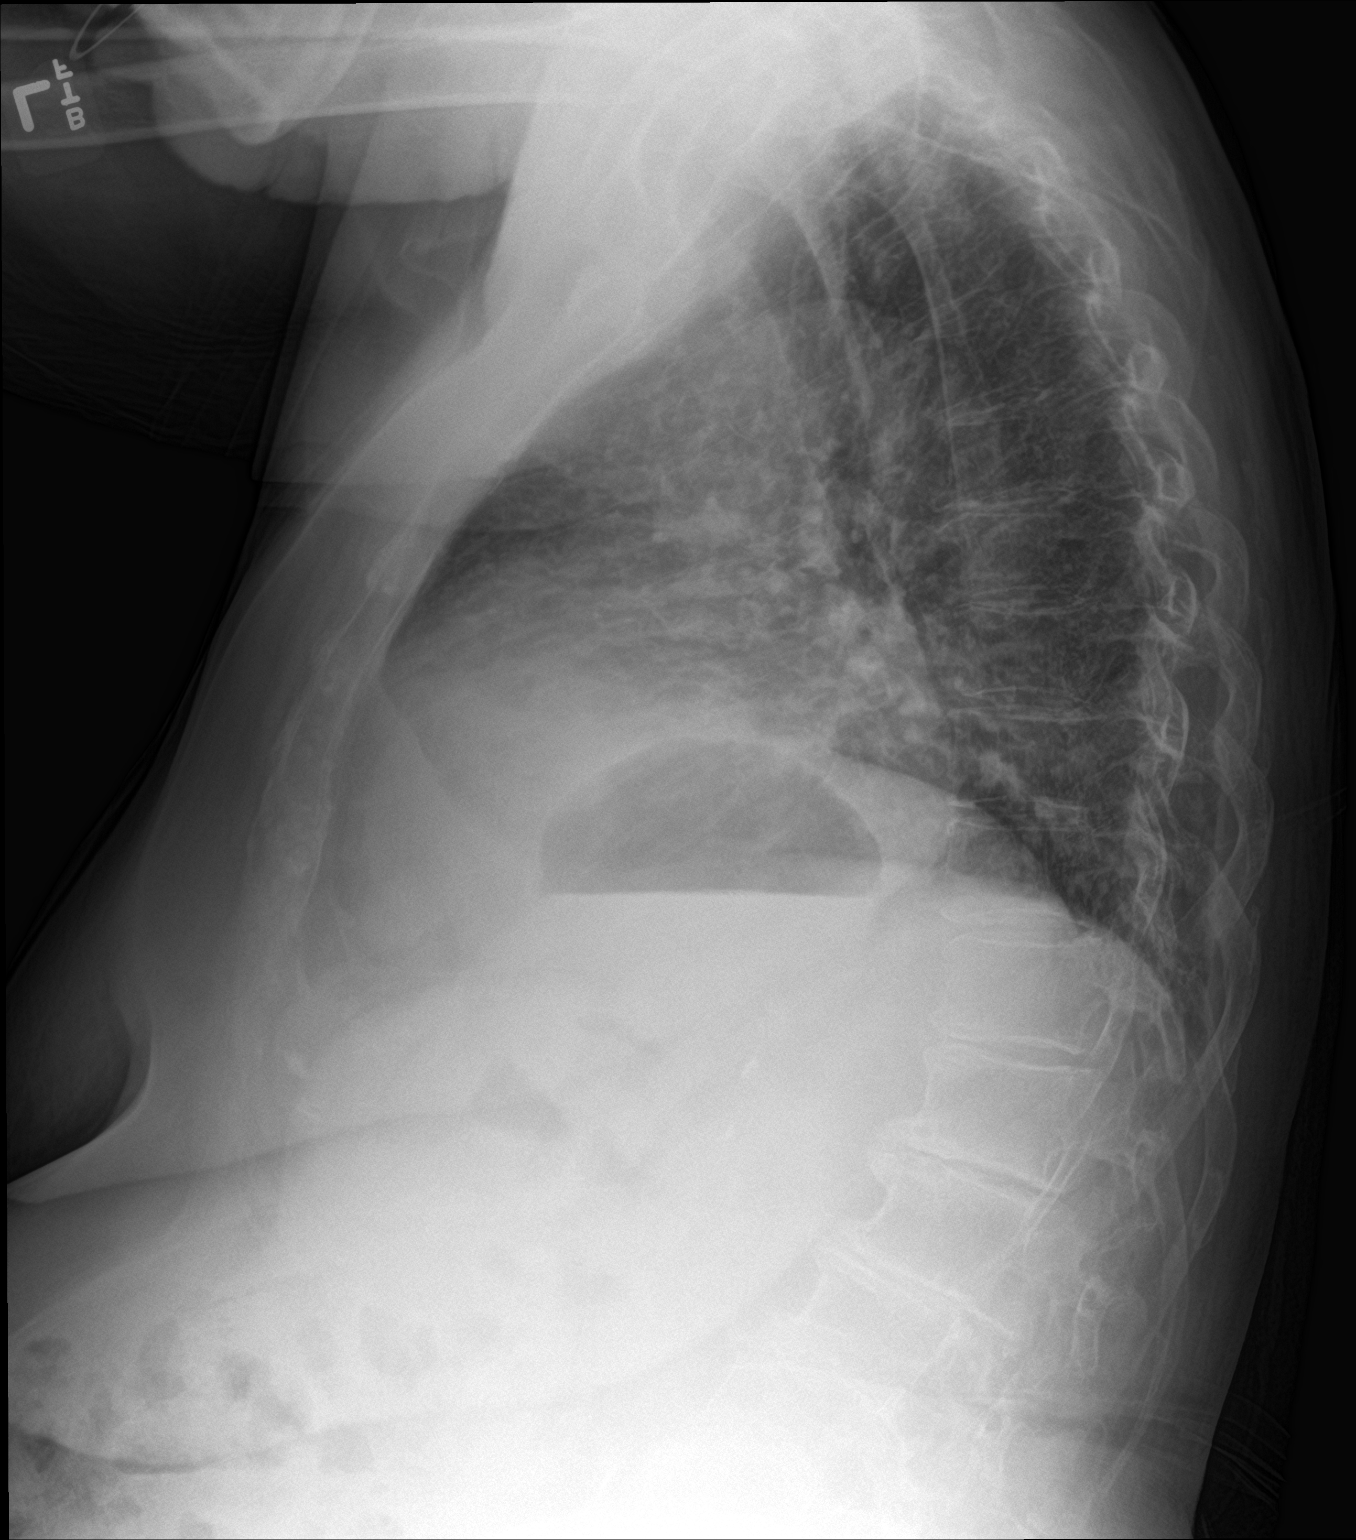

[2 of 2 positions shown; findings below may reference images not displayed]

FINDINGS: Stable cardiomegaly. Overall cardiomediastinal silhouette is grossly
stable in size and configuration. Lungs are clear. No pleural
effusion or pneumothorax seen no acute or suspicious osseous
finding. Degenerative changes again noted within the a thoracolumbar
spine. Stable levoscoliosis of the thoracolumbar spine.
IMPRESSION: 1. No active cardiopulmonary disease. No evidence of pneumonia or
pulmonary edema.
2. Stable cardiomegaly.

## 2019-07-24 NOTE — Progress Notes (Signed)
Location:  [facility] Room Number: 113-W Place of Service:  SNF (31)  Hennie Duos, MD  Patient Care Team: Hennie Duos, MD as PCP - General (Internal Medicine) Rothbart, Cristopher Estimable, MD (Cardiology) [facility] [place] ([place]) Gerlene Fee, NP as Nurse Practitioner (Geriatric Medicine)  Extended Emergency Contact Information Primary Emergency Contact: [facility] Address: [address]          [place], [place] [address] [name] of [place] Phone: [phone] Mobile Phone: [phone] Relation: Daughter    Allergies: Codeine sulfate [codeine], Morphine and related, Penicillins, and Ace inhibitors  Chief Complaint  Patient presents with  . Medical Management of Chronic Issues    Routine [place] visit    HPI: Patient is a 79 y.o. female who is being seen for routine issues of depression, hypokalemia, and hypertension.  Past Medical History:  Diagnosis Date  . Allergy   . Atrial fibrillation ([place]) 08/2011   First diagnosed in 08/2011; duration of arrhythmia is uncertain  . Bilateral lower extremity edema   . Chronic diarrhea    diverticulosis  . COPD (chronic obstructive pulmonary disease) ([place])   . Dementia ([place])   . Diabetes mellitus, type 2 (HCC)    Diabetic neuropathy  . Dyspnea on exertion    pedal edema  . Gout   . Headache(784.0)    twice weekly  . Hyperlipidemia   . Hypertension   . Osteopenia    DEXA scan 01/2010  . Palpitations   . Seasonal allergies   . Stress incontinence   . Vertigo     Past Surgical History:  Procedure Laterality Date  . APPENDECTOMY    . BREAST BIOPSY  2002   Right  . CESAREAN SECTION     x 2  . CHOLECYSTECTOMY    . COLONOSCOPY  2007   Negative screening study  . COLONOSCOPY WITH PROPOFOL N/A 09/18/2017   Procedure: COLONOSCOPY WITH PROPOFOL;  Surgeon: Daneil Dolin, MD;  [facility];  Service: Endoscopy;  Laterality: N/A;   11:00am  . HAMMER TOE SURGERY     Bilateral hammer toe amputation  . INCISIONAL HERNIA REPAIR    . KNEE ARTHROSCOPY W/ MENISCAL REPAIR  2007   Bilateral  . POLYPECTOMY  09/18/2017   Procedure: POLYPECTOMY;  Surgeon: Daneil Dolin, MD;  [facility];  Service: Endoscopy;;  colon  . UMBILICAL HERNIA REPAIR      Allergies as of 07/24/2019      Reactions   Codeine Sulfate [codeine] Anaphylaxis, Hives   Morphine And Related Anaphylaxis, Hives   Penicillins Anaphylaxis   Ace Inhibitors Cough      Medication List    Notice   This visit is during an admission. Changes to the med list made in this visit will be reflected in the After Visit Summary of the admission.     No orders of the defined types were placed in this encounter.   Immunization History  Administered Date(s) Administered  . Influenza-Unspecified 02/03/2017, 02/01/2018, 02/04/2019  . Moderna SARS-COVID-2 Vaccination 05/08/2019, 06/05/2019  . Pneumococcal Conjugate-13 02/03/2017  . Pneumococcal-Unspecified 02/08/2016  . Tdap 01/19/2017  . Zoster 08/02/2018    Social History   Tobacco Use  . Smoking status: Never Smoker  . Smokeless tobacco: Never Used  Substance Use Topics  . Alcohol use: No    Review of Systems  DATA OBTAINED: from patient, nurse, medical record, family member GENERAL:  no fevers,  fatigue, appetite changes SKIN: No itching, rash HEENT: No complaint RESPIRATORY: No cough, wheezing, SOB CARDIAC: No chest pain, palpitations, lower extremity edema  GI: No abdominal pain, No N/V/D or constipation, No heartburn or reflux  GU: No dysuria, frequency or urgency, or incontinence  MUSCULOSKELETAL: No unrelieved bone/joint pain NEUROLOGIC: No headache, dizziness  PSYCHIATRIC: No overt anxiety or sadness  Vitals:   07/24/19 1141  BP: 110/66  Pulse: 74  Resp: 18  Temp: 98.6 F (37 C)  SpO2: 98%   Body mass index is 28.5 kg/m. Physical Exam  GENERAL APPEARANCE: Alert,  conversant, No acute distress  SKIN: No diaphoresis rash HEENT: Unremarkable RESPIRATORY: Breathing is even, unlabored. Lung sounds are clear   CARDIOVASCULAR: Heart irregular no murmurs, rubs or gallops. No peripheral edema  GASTROINTESTINAL: Abdomen is soft, non-tender, not distended w/ normal bowel sounds.  GENITOURINARY: Bladder non tender, not distended  MUSCULOSKELETAL: No abnormal joints or musculature NEUROLOGIC: Cranial nerves 2-12 grossly intact. Moves all extremities PSYCHIATRIC:, no behavioral issues  Patient Active Problem List   Diagnosis Date Noted  . Breast mass, right 07/24/2019  . Controlled diabetes mellitus type 2 with complications ([place]) [id number]  . Bradycardia 11/14/2018  . 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 Hypokalemia 07/02/2018  . GERD without esophagitis 07/02/2018  . Dyslipidemia associated with type 2 diabetes mellitus ([place]) 07/02/2018  . CKD stage 2 due to type 2 diabetes mellitus ([place]) 07/02/2018  . Major depression, recurrent, chronic ([place]) 07/02/2018  . Anemia 09/14/2017  . Bilateral leg edema 01/10/2017  . Osteoporosis 08/09/2016  . Diabetic neuropathy ([place]) 09/29/2015  . Dementia with behavioral disturbance ([place]) 02/24/2015  . Chronic atrial fibrillation ([place])   . Chronic anticoagulation 09/21/2011  . Hypertension   . Type 2 diabetes, uncontrolled, with peripheral circulatory disorder (HCC)     CMP     Component Value Date/Time   NA 139 07/16/2019 0340   NA 139 06/30/2015 1712   K 4.2 07/16/2019 0340   CL 104 07/16/2019 0340   CO2 28 07/16/2019 0340   GLUCOSE 112 (H) 07/16/2019 0340   BUN 26 (H) 07/16/2019 0340   BUN 15 06/30/2015 1712   CREATININE 1.15 (H) 07/16/2019 0340   CREATININE 0.90 07/04/2012 1326   CALCIUM 9.3 07/16/2019 0340   PROT 7.4 04/23/2019 1227   PROT 7.3 06/30/2015 1712   ALBUMIN 3.2 (L) 04/23/2019 1227   ALBUMIN 3.5 06/30/2015 1712    AST 21 04/23/2019 1227   ALT 19 04/23/2019 1227   ALKPHOS 60 04/23/2019 1227   BILITOT 0.5 04/23/2019 1227   BILITOT 0.2 06/30/2015 1712   GFRNONAA 45 (L) 07/16/2019 0340   GFRAA 52 (L) 07/16/2019 0340   Recent Labs    05/31/19 0405 05/31/19 0405 07/04/19 0420 07/11/19 0818 07/16/19 0340  NA 138  --  137  --  139  K 4.4   < > 3.2* 3.3* 4.2  CL 100  --  99  --  104  CO2 28  --  29  --  28  GLUCOSE 287*  --  118*  --  112*  BUN 27*  --  33*  --  26*  CREATININE 1.39*  --  1.23*  --  1.15*  CALCIUM 9.4  --  9.2  --  9.3   < > = values in this interval not displayed.   Recent Labs    04/23/19 1227  AST 21  ALT 19  ALKPHOS 60  BILITOT 0.5  PROT 7.4  ALBUMIN 3.2*   Recent Labs    08/09/18 0318 08/09/18 0318 10/09/18 0702 04/23/19 1227 07/04/19 0420  WBC 10.3   < > 8.3 9.6 8.9  NEUTROABS 6.4  --  5.3 6.4  --   HGB 12.1   < > 11.9* 13.0 10.7*  HCT 39.2   < > 38.2 41.4 33.9*  MCV 92.2   < > 91.6 90.6 92.1  PLT 242   < > 232 280 266   < > = values in this interval not displayed.   Recent Labs    08/09/18 0318 07/04/19 0420  CHOL 148 129  LDLCALC 95 82  TRIG 107 125   Lab Results  Component Value Date   MICROALBUR 5.3 (H) 01/24/2019   Lab Results  Component Value Date   TSH 2.028 07/04/2019   Lab Results  Component Value Date   HGBA1C 7.1 (H) 07/04/2019   Lab Results  Component Value Date   CHOL 129 07/04/2019   HDL 22 (L) 07/04/2019   LDLCALC 82 07/04/2019   TRIG 125 07/04/2019   CHOLHDL 5.9 07/04/2019    Significant Diagnostic Results in last 30 days:  No results found.  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

## 2019-07-28 ENCOUNTER — Encounter: Payer: Self-pay | Admitting: Internal Medicine

## 2019-07-28 NOTE — Assessment & Plan Note (Signed)
Stable; continue potassium 40 mEq 3 times daily

## 2019-07-28 NOTE — Assessment & Plan Note (Signed)
Controlled; continue metoprolol 50 mg twice daily, Lasix 20 mg daily, and spironolactone 12.5 mg daily

## 2019-07-28 NOTE — Assessment & Plan Note (Signed)
Chronic and stable; continue Celexa 10 mg daily

## 2019-07-29 ENCOUNTER — Inpatient Hospital Stay (HOSPITAL_COMMUNITY): Payer: Medicare Other | Attending: Hematology | Admitting: Hematology

## 2019-07-29 ENCOUNTER — Encounter (HOSPITAL_COMMUNITY): Payer: Self-pay | Admitting: Hematology

## 2019-07-29 DIAGNOSIS — I890 Lymphedema, not elsewhere classified: Secondary | ICD-10-CM | POA: Insufficient documentation

## 2019-07-29 DIAGNOSIS — M8580 Other specified disorders of bone density and structure, unspecified site: Secondary | ICD-10-CM | POA: Diagnosis not present

## 2019-07-29 DIAGNOSIS — F0390 Unspecified dementia without behavioral disturbance: Secondary | ICD-10-CM | POA: Insufficient documentation

## 2019-07-29 DIAGNOSIS — E785 Hyperlipidemia, unspecified: Secondary | ICD-10-CM | POA: Diagnosis not present

## 2019-07-29 DIAGNOSIS — Z993 Dependence on wheelchair: Secondary | ICD-10-CM | POA: Insufficient documentation

## 2019-07-29 DIAGNOSIS — I10 Essential (primary) hypertension: Secondary | ICD-10-CM | POA: Diagnosis not present

## 2019-07-29 DIAGNOSIS — E1140 Type 2 diabetes mellitus with diabetic neuropathy, unspecified: Secondary | ICD-10-CM | POA: Insufficient documentation

## 2019-07-29 DIAGNOSIS — N6310 Unspecified lump in the right breast, unspecified quadrant: Secondary | ICD-10-CM | POA: Insufficient documentation

## 2019-07-29 DIAGNOSIS — N6341 Unspecified lump in right breast, subareolar: Secondary | ICD-10-CM

## 2019-07-29 DIAGNOSIS — J449 Chronic obstructive pulmonary disease, unspecified: Secondary | ICD-10-CM | POA: Insufficient documentation

## 2019-07-29 DIAGNOSIS — I4891 Unspecified atrial fibrillation: Secondary | ICD-10-CM | POA: Insufficient documentation

## 2019-07-29 NOTE — Assessment & Plan Note (Addendum)
1.  Right breast mass: -She has been a resident at [facility] for the past 2 years secondary to severe dementia. -Incidental right breast lump was noticed about 2 weeks ago. -Limited right breast ultrasound on 07/18/2019 shows 3.4 x 4.4 x 3.6 cm rounded hypoechoic mass with vascularity in the right breast. -She is accompanied by her daughter today.  Reportedly dementia gotten worse since Covid restrictions began. -Physical examination today revealed about 5 cm right breast mass behind the retroareolar region.  No clearly palpable adenopathy.  No masses palpable in the other breast.  She has chronic lymphedema of the lower extremities, right more than left of several years duration. -We had a prolonged discussion with the patient's daughter about normal prognosis and management of breast cancer.  We also talked about further work-up including scans for staging.  However after discussing the procedures, it is felt that patient would not be able to do the scans.  We also talked about the possibility of biopsy.  Given her advanced dementia, I have recommended against it at this time. -Her daughter will call us if she wishes to have either scans or biopsy done.

## 2019-07-29 NOTE — Progress Notes (Signed)
[facility] NOTE  Patient Care Team: Hennie Duos, MD as PCP - General (Internal Medicine) Rothbart, Cristopher Estimable, MD (Cardiology) [facility] [place] ([place]) Nyoka Cowden, Phylis Bougie, NP as Nurse Practitioner (Geriatric Medicine) Donetta Potts, RN as Oncology Nurse Navigator (Oncology) Derek Jack, MD as Consulting Physician (Hematology)  CHIEF COMPLAINTS/PURPOSE OF CONSULTATION:  Newly diagnosed right breast mass  HISTORY OF PRESENTING ILLNESS:  Christine [name] 79 y.o. female is seen for further management of newly diagnosed right breast mass.  She has been a resident at [facility] for 2 years secondary to advanced dementia.  She remembers her daughter.  Daughter is present with the patient today.  She reports that her dementia has gotten worse since last year.  Breast mass was noticed about 2 weeks ago.  Limited breast ultrasound was done in the nursing home which showed a 3.4 x 4.4 x 3.6 cm mass in the right breast.  There is no indication that the patient is in any kind of pain from this mass.  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  She was never smoker.  She has used to drink alcohol when she was young.  Patient was raised in an orphanage as her parents died at age 85.  Hence there is no family history of malignancies or dementia.  Most of the history is provided by patient's daughter.  She is confined to a wheelchair most part of the day. I reviewed her records extensively and collaborated the history with the patient.  SUMMARY OF ONCOLOGIC HISTORY: Oncology History   No history exists.    In terms of breast cancer risk profile:  She menarched at unknown age and went to menopause at unknown age. She had 2 pregnancies, her first child was born at age 38 It is not clear if she ever used OCPs.  She did not use HRT's. Family history not clear as her parents died at age 66 and she was brought up in an orphanage.  MEDICAL HISTORY:   Past Medical History:  Diagnosis Date  . Allergy   . Atrial fibrillation ([place]) 08/2011   First diagnosed in 08/2011; duration of arrhythmia is uncertain  . Bilateral lower extremity edema   . Chronic diarrhea    diverticulosis  . COPD (chronic obstructive pulmonary disease) ([place])   . Dementia ([place])   . Diabetes mellitus, type 2 (HCC)    Diabetic neuropathy  . Dyspnea on exertion    pedal edema  . Gout   . Headache(784.0)    twice weekly  . Hyperlipidemia   . Hypertension   . Osteopenia    DEXA scan 01/2010  . Palpitations   . Seasonal allergies   . Stress incontinence   . Vertigo     SURGICAL HISTORY: Past Surgical History:  Procedure Laterality Date  . APPENDECTOMY    . BREAST BIOPSY  2002   Right  . CESAREAN SECTION     x 2  . CHOLECYSTECTOMY    . COLONOSCOPY  2007   Negative screening study  . COLONOSCOPY WITH PROPOFOL N/A 09/18/2017   Procedure: COLONOSCOPY WITH PROPOFOL;  Surgeon: Daneil Dolin, MD;  [facility];  Service: Endoscopy;  Laterality: N/A;  11:00am  . HAMMER TOE SURGERY     Bilateral hammer toe amputation  . INCISIONAL HERNIA REPAIR    . KNEE ARTHROSCOPY W/ MENISCAL REPAIR  2007   Bilateral  . POLYPECTOMY  09/18/2017   Procedure: POLYPECTOMY;  Surgeon: Daneil Dolin, MD;  [facility];  Service: Endoscopy;;  colon  . UMBILICAL HERNIA REPAIR      SOCIAL HISTORY: Social History   Socioeconomic History  . Marital status: Widowed    Spouse name: Not on file  . Number of children: 2  . Years of education: Not on file  . Highest education level: Not on file  Occupational History  . Occupation: Engineer, materials  Tobacco Use  . Smoking status: Never Smoker  . Smokeless tobacco: Never Used  Substance and Sexual Activity  . Alcohol use: No  . Drug use: No  . Sexual activity: Not Currently  Other Topics Concern  . Not on file  Social History Narrative  . Not on file   Social Determinants of Health   Financial  Resource Strain: Low Risk   . Difficulty of Paying Living Expenses: Not hard at all  Food Insecurity: No Food Insecurity  . Worried About Charity fundraiser in the Last Year: Never true  . Ran Out of Food in the Last Year: Never true  Transportation Needs: No Transportation Needs  . Lack of Transportation (Medical): No  . Lack of Transportation (Non-Medical): No  Physical Activity: Inactive  . Days of Exercise per Week: 0 days  . Minutes of Exercise per Session: 0 min  Stress:   . Feeling of Stress :   Social Connections: Moderately Isolated  . Frequency of Communication with Friends and Family: Never  . Frequency of Social Gatherings with Friends and Family: Never  . Attends Religious Services: Never  . Active Member of Clubs or Organizations: Yes  . Attends Archivist Meetings: Never  . Marital Status: Widowed  Intimate Partner Violence: Not At Risk  . Fear of Current or Ex-Partner: No  . Emotionally Abused: No  . Physically Abused: No  . Sexually Abused: No    FAMILY HISTORY: Family History  Adopted: Yes    ALLERGIES:  is allergic to codeine sulfate [codeine]; morphine and related; penicillins; and ace inhibitors.  MEDICATIONS:  No current outpatient medications on file.   No current facility-administered medications for this visit.    REVIEW OF SYSTEMS:   Constitutional: Denies fevers, chills or abnormal night sweats Eyes: Denies blurriness of vision, double vision or watery eyes Ears, nose, mouth, throat, and face: Denies mucositis or sore throat Respiratory: Denies cough, dyspnea or wheezes Cardiovascular: Denies palpitation, chest discomfort.  Positive for leg swelling. Gastrointestinal:  Denies nausea, heartburn or change in bowel habits Skin: Denies abnormal skin rashes Lymphatics: Denies new lymphadenopathy or easy bruising Neurological:Denies numbness, tingling or new weaknesses Behavioral/Psych: Mood is stable, no new changes  Breast: Denies  any pain in the breast. All other systems were reviewed with the patient and are negative.  PHYSICAL EXAMINATION: ECOG PERFORMANCE STATUS: 3 - Symptomatic, >50% confined to bed  Vitals:   07/29/19 1348  BP: (!) 84/67  Pulse: 82  Resp: 19  Temp: (!) 97.1 F (36.2 C)  SpO2: 100%   Filed Weights   07/29/19 1348  Weight: 173 lb 3.2 oz (78.6 kg)    GENERAL:alert, no distress and comfortable SKIN: skin color, texture, turgor are normal, no rashes or significant lesions EYES: normal, conjunctiva are pink and non-injected, sclera clear OROPHARYNX:no exudate, no erythema and lips, buccal mucosa, and tongue normal  NECK: supple, thyroid normal size, non-tender, without nodularity LYMPH:  no palpable lymphadenopathy in the cervical, axillary or inguinal LUNGS: clear to auscultation and percussion with  normal breathing effort HEART: regular rate & rhythm and no murmurs.  Bilateral leg swelling present, right more than left. ABDOMEN:abdomen soft, non-tender and normal bowel sounds Musculoskeletal:no cyanosis of digits and no clubbing  PSYCH: alert & oriented x 3 with fluent speech NEURO: no focal motor/sensory deficits BREAST: There is a 5 cm mass in the right breast occupying most of the breast.  No palpable axillary adenopathy.  LABORATORY DATA:  I have reviewed the data as listed Lab Results  Component Value Date   WBC 8.9 07/04/2019   HGB 10.7 (L) 07/04/2019   HCT 33.9 (L) 07/04/2019   MCV 92.1 07/04/2019   PLT 266 07/04/2019   Lab Results  Component Value Date   NA 139 07/16/2019   K 4.2 07/16/2019   CL 104 07/16/2019   CO2 28 07/16/2019    RADIOGRAPHIC STUDIES: I have personally reviewed the radiological reports and agreed with the findings in the report.  ASSESSMENT AND PLAN:  Breast mass, right 1.  Right breast mass: -She has been a resident at [facility] for the past 2 years secondary to severe dementia. -Incidental right breast lump was noticed about 2 weeks  ago. -Limited right breast ultrasound on 07/18/2019 shows 3.4 x 4.4 x 3.6 cm rounded hypoechoic mass with vascularity in the right breast. -She is accompanied by her daughter today.  Reportedly dementia gotten worse since Covid restrictions began. -Physical examination today revealed about 5 cm right breast mass behind the retroareolar region.  No clearly palpable adenopathy.  No masses palpable in the other breast.  She has chronic lymphedema of the lower extremities, right more than left of several years duration. -We had a prolonged discussion with the patient's daughter about normal prognosis and management of breast cancer.  We also talked about further work-up including scans for staging.  However after discussing the procedures, it is felt that patient would not be able to do the scans.  We also talked about the possibility of biopsy.  Given her advanced dementia, I have recommended against it at this time. -Her daughter will call us if she wishes to have either scans or biopsy done.  Total time spent is 60 minutes with more than 50% of the time spent face-to-face discussing possible diagnosis, prognosis, counseling and coordination of care. All questions were answered. The patient knows to call the clinic with any problems, questions or concerns.    Derek Jack, MD 07/29/19

## 2019-07-29 NOTE — Patient Instructions (Addendum)
[facility] at [facility] Discharge Instructions  You were seen today by Dr. Delton Coombes. He went over your history, family history and how you've been feeling lately. The ultra sound showed that you have a lump in your breast that is about 2 inches big.   Thank you for choosing [facility] to provide your oncology and hematology care.  To afford each patient quality time with our provider, please arrive at least 15 minutes before your scheduled appointment time.   If you have a lab appointment with the [facility] please come in thru the  Main Entrance and check in at the main information desk  You need to re-schedule your appointment should you arrive 10 or more minutes late.  We strive to give you quality time with our providers, and arriving late affects you and other patients whose appointments are after yours.  Also, if you no show three or more times for appointments you may be dismissed from the clinic at the providers discretion.     Again, thank you for choosing [facility].  Our hope is that these requests will decrease the amount of time that you wait before being seen by our physicians.       _____________________________________________________________  Should you have questions after your visit to [facility], please contact our office at [phone] between the hours of 8:00 a.m. and 4:30 p.m.  Voicemails left after 4:00 p.m. will not be returned until the following business day.  For prescription refill requests, have your pharmacy contact our office and allow 72 hours.    Cancer Center Support Programs:   > Cancer Support Group  2nd Tuesday of the month 1pm-2pm, Journey Room

## 2019-07-30 ENCOUNTER — Encounter: Payer: Self-pay | Admitting: Adult Health

## 2019-07-30 ENCOUNTER — Non-Acute Institutional Stay (SKILLED_NURSING_FACILITY): Payer: Medicare Other | Admitting: Adult Health

## 2019-07-30 DIAGNOSIS — Z0000 Encounter for general adult medical examination without abnormal findings: Secondary | ICD-10-CM | POA: Diagnosis not present

## 2019-07-30 NOTE — Progress Notes (Signed)
Subjective:   TRINATI CIHLAR is a 79 y.o. female who presents for Medicare Annual (Subsequent) preventive examination.  Review of Systems:  Review of Systems  Unable to perform ROS: Dementia (unable to participate )         Objective:     Vitals: BP 109/64   Pulse 77   Temp 98.4 F (36.9 C) (Oral)   Resp 20   Ht 5\' 6"  (1.676 m)   Wt 176 lb 9.6 oz (80.1 kg)   SpO2 98%   BMI 28.50 kg/m   Body mass index is 28.5 kg/m.  Advanced Directives 07/30/2019 07/29/2019 07/24/2019 07/17/2019 07/11/2019 07/04/2019 07/01/2019  Does Patient Have a Medical Advance Directive? Yes Yes Yes Yes Yes Yes Yes  Type of Advance Directive Out of facility DNR (pink MOST or yellow form) [place];Living will Out of facility DNR (pink MOST or yellow form) Out of facility DNR (pink MOST or yellow form) Out of facility DNR (pink MOST or yellow form) Out of facility DNR (pink MOST or yellow form) Out of facility DNR (pink MOST or yellow form)  Does patient want to make changes to medical advance directive? No - Patient declined No - Patient declined No - Patient declined No - Patient declined No - Patient declined No - Patient declined No - Patient declined  Copy of [place] in Chart? - No - copy requested - - - - -  Pre-existing out of facility DNR order (yellow form or pink MOST form) - - Yellow form placed in chart (order not valid for inpatient use) Yellow form placed in chart (order not valid for inpatient use) Yellow form placed in chart (order not valid for inpatient use) Yellow form placed in chart (order not valid for inpatient use) Yellow form placed in chart (order not valid for inpatient use)    Tobacco Social History   Tobacco Use  Smoking Status Never Smoker  Smokeless Tobacco Never Used     Counseling given: Not Answered   Clinical Intake:  Pre-visit preparation completed: Yes  Pain : No/denies pain     BMI - recorded: 28.5 Nutritional Status: BMI  25 -29 Overweight Diabetes: Yes CBG done?: (per nursing staff)     Interpreter Needed?: No     Past Medical History:  Diagnosis Date  . Allergy   . Atrial fibrillation ([place]) 08/2011   First diagnosed in 08/2011; duration of arrhythmia is uncertain  . Bilateral lower extremity edema   . Chronic diarrhea    diverticulosis  . COPD (chronic obstructive pulmonary disease) ([place])   . Dementia ([place])   . Diabetes mellitus, type 2 (HCC)    Diabetic neuropathy  . Dyspnea on exertion    pedal edema  . Gout   . Headache(784.0)    twice weekly  . Hyperlipidemia   . Hypertension   . Osteopenia    DEXA scan 01/2010  . Palpitations   . Seasonal allergies   . Stress incontinence   . Vertigo    Past Surgical History:  Procedure Laterality Date  . APPENDECTOMY    . BREAST BIOPSY  2002   Right  . CESAREAN SECTION     x 2  . CHOLECYSTECTOMY    . COLONOSCOPY  2007   Negative screening study  . COLONOSCOPY WITH PROPOFOL N/A 09/18/2017   Procedure: COLONOSCOPY WITH PROPOFOL;  Surgeon: Daneil Dolin, MD;  [facility];  Service: Endoscopy;  Laterality: N/A;  11:00am  .  HAMMER TOE SURGERY     Bilateral hammer toe amputation  . INCISIONAL HERNIA REPAIR    . KNEE ARTHROSCOPY W/ MENISCAL REPAIR  2007   Bilateral  . POLYPECTOMY  09/18/2017   Procedure: POLYPECTOMY;  Surgeon: Daneil Dolin, MD;  [facility];  Service: Endoscopy;;  colon  . UMBILICAL HERNIA REPAIR     Family History  Adopted: Yes   Social History   Socioeconomic History  . Marital status: Widowed    Spouse name: Not on file  . Number of children: 2  . Years of education: Not on file  . Highest education level: Not on file  Occupational History  . Occupation: Engineer, materials  Tobacco Use  . Smoking status: Never Smoker  . Smokeless tobacco: Never Used  Substance and Sexual Activity  . Alcohol use: No  . Drug use: No  . Sexual activity: Not Currently  Other Topics Concern  . Not on  file  Social History Narrative  . Not on file   Social Determinants of Health   Financial Resource Strain: Low Risk   . Difficulty of Paying Living Expenses: Not hard at all  Food Insecurity: No Food Insecurity  . Worried About Charity fundraiser in the Last Year: Never true  . Ran Out of Food in the Last Year: Never true  Transportation Needs: No Transportation Needs  . Lack of Transportation (Medical): No  . Lack of Transportation (Non-Medical): No  Physical Activity: Inactive  . Days of Exercise per Week: 0 days  . Minutes of Exercise per Session: 0 min  Stress:   . Feeling of Stress :   Social Connections: Moderately Isolated  . Frequency of Communication with Friends and Family: Never  . Frequency of Social Gatherings with Friends and Family: Never  . Attends Religious Services: Never  . Active Member of Clubs or Organizations: Yes  . Attends Archivist Meetings: Never  . Marital Status: Widowed    Outpatient Encounter Medications as of 07/30/2019  Medication Sig  . acetaminophen (TYLENOL) 325 MG tablet Take 650 mg by mouth every 6 (six) hours as needed.   Marland Kitchen apixaban (ELIQUIS) 5 MG TABS tablet Take 5 mg by mouth 2 (two) times daily.   . carboxymethylcellulose (REFRESH PLUS) 0.5 % SOLN Place 1 drop into both eyes 4 (four) times daily.  . cholecalciferol (VITAMIN D) 1000 UNITS tablet Take 1,000 Units by mouth daily.  Marland Kitchen denosumab (PROLIA) 60 MG/ML SOSY injection Inject 60 mg into the skin every 6 (six) months.  . digoxin (LANOXIN) 0.125 MG tablet Take 1 tablet by mouth once a day (HOLD FOR AP UNDER 60)  . furosemide (LASIX) 20 MG tablet Take 20 mg by mouth daily.  . insulin aspart (NOVOLOG) 100 UNIT/ML injection Inject 17 Units into the skin 3 (three) times daily with meals. If BS above 150  . LANTUS SOLOSTAR 100 UNIT/ML Solostar Pen Inject 53 Units into the skin at bedtime.   . memantine (NAMENDA) 10 MG tablet Take 10 mg by mouth 2 (two) times daily.  . metolazone  (ZAROXOLYN) 5 MG tablet Take 5 mg by mouth daily. On Monday and Friday   . metoprolol tartrate (LOPRESSOR) 50 MG tablet Take 50 mg by mouth 2 (two) times daily.  . NON FORMULARY Diet Type:  NAS, consistent CHO, Dysphagia 3 with thin liquids  . NON FORMULARY Give Cranberry Juice twice a day  . omeprazole (PRILOSEC) 40 MG capsule Take 40 mg by mouth  daily.  . polyethylene glycol (MIRALAX / GLYCOLAX) packet Take 17 g by mouth daily as needed. daily prn for constipation - mix with 6 oz of liquid and drink  . potassium chloride SA (KLOR-CON) 20 MEQ tablet Take 40 mEq by mouth 4 (four) times daily.   . pravastatin (PRAVACHOL) 20 MG tablet Take 20 mg by mouth daily. Take along with 40 mg to = 60 mg  . pravastatin (PRAVACHOL) 40 MG tablet Take 40 mg by mouth daily. Take along with 20 mg to = 60 mg  . sitaGLIPtin-metformin (JANUMET) 50-500 MG tablet Take 1 tablet by mouth 2 (two) times daily with a meal.  . spironolactone (ALDACTONE) 25 MG tablet Take 12.5 mg by mouth daily. 1/2 Tablet = 12.5mg   . [DISCONTINUED] doxycycline (VIBRAMYCIN) 100 MG capsule Take 100 mg by mouth 2 (two) times daily.  . [DISCONTINUED] Insulin Glargine (BASAGLAR KWIKPEN [place]) Inject 53 Units into the skin daily.  . [DISCONTINUED] NOVOFINE AUTOCOVER 30G X 8 MM MISC    No facility-administered encounter medications on file as of 07/30/2019.    Activities of Daily Living No flowsheet data found.  Patient Care Team: Hennie Duos, MD as PCP - General (Internal Medicine) Rothbart, Cristopher Estimable, MD (Cardiology) [facility] [place] [facility]) Nyoka Cowden, Phylis Bougie, NP as Nurse Practitioner (Geriatric Medicine) Donetta Potts, RN as Oncology Nurse Navigator (Oncology) Derek Jack, MD as Consulting Physician (Hematology)    Assessment:   This is a routine wellness examination for Declynn.  Exercise Activities and Dietary recommendations    Goals    . DIET - INCREASE WATER INTAKE    . Follow up with  Primary Care Provider       Fall Risk Fall Risk  07/30/2019 12/17/2018 12/17/2018 01/30/2018 01/19/2017  Falls in the past year? 1 1 1  No No  Number falls in past yr: 0 1 1 - -  Injury with Fall? 0 0 0 - -  Risk for fall due to : History of fall(s);Impaired balance/gait;Impaired mobility History of fall(s);Impaired balance/gait;Impaired mobility History of fall(s);Impaired balance/gait;Impaired mobility - -  Follow up - Falls evaluation completed Falls evaluation completed - -   Is the patient's home free of loose throw rugs in walkways, pet beds, electrical cords, etc?   yes      Grab bars in the bathroom? yes      Handrails on the stairs?   n/a      Adequate lighting?   yes  Timed Get Up and Go performed: unable to participate   Depression Screen PHQ 2/9 Scores 07/30/2019 12/17/2018 01/30/2018 01/19/2017  PHQ - 2 Score - 0 0 0  Exception Documentation (No Data) Medical reason - -     Cognitive Function MMSE - Mini Mental State Exam 01/19/2017  Not completed: Refused     6CIT Screen 01/30/2018  What Year? 4 points  What month? 3 points  What time? 3 points  Count back from 20 4 points  Months in reverse 4 points  Repeat phrase 8 points  Total Score 26    Immunization History  Administered Date(s) Administered  . Influenza-Unspecified 02/03/2017, 02/01/2018, 02/04/2019  . Moderna SARS-COVID-2 Vaccination 05/08/2019, 06/05/2019  . Pneumococcal Conjugate-13 02/03/2017  . Pneumococcal-Unspecified 02/08/2016  . Tdap 01/19/2017  . Zoster 08/02/2018    Qualifies for Shingles Vaccine? Long term resident of [facility]   Screening Tests Health Maintenance  Topic Date Due  . HEMOGLOBIN A1C  01/04/2020  . URINE MICROALBUMIN  01/24/2020  .  FOOT EXAM  02/22/2020  . OPHTHALMOLOGY EXAM  03/10/2020  . TETANUS/TDAP  01/20/2027  . INFLUENZA VACCINE  Completed  . DEXA SCAN  Completed  . PNA vac Low Risk Adult  Completed    Cancer Screenings: Lung: Low Dose CT Chest recommended if Age  98-80 years, 30 pack-year currently smoking OR have quit w/in 15years. Patient does not  qualify. Breast:  Up to date on Mammogram? yes   Up to date of Bone Density/Dexa? yes Colorectal: n/a  Additional Screenings: n/a Hepatitis C Screening:      Plan:     I have personally reviewed and noted the following in the patient's chart:   . Medical and social history . Use of alcohol, tobacco or illicit drugs  . Current medications and supplements . Functional ability and status . Nutritional status . Physical activity . Advanced directives . List of other physicians . Hospitalizations, surgeries, and ER visits in previous 12 months . Vitals . Screenings to include cognitive, depression, and falls . Referrals and appointments  In addition, I have reviewed and discussed with patient certain preventive protocols, quality metrics, and best practice recommendations. A written personalized care plan for preventive services as well as general preventive health recommendations were provided to patient.     Gerlene Fee, NP  07/30/2019

## 2019-07-31 DIAGNOSIS — F4323 Adjustment disorder with mixed anxiety and depressed mood: Secondary | ICD-10-CM | POA: Diagnosis not present

## 2019-07-31 DIAGNOSIS — F331 Major depressive disorder, recurrent, moderate: Secondary | ICD-10-CM | POA: Diagnosis not present

## 2019-08-06 DIAGNOSIS — E1140 Type 2 diabetes mellitus with diabetic neuropathy, unspecified: Secondary | ICD-10-CM | POA: Diagnosis not present

## 2019-08-06 DIAGNOSIS — Z1159 Encounter for screening for other viral diseases: Secondary | ICD-10-CM | POA: Diagnosis not present

## 2019-08-08 ENCOUNTER — Encounter: Payer: Self-pay | Admitting: Adult Health

## 2019-08-08 ENCOUNTER — Non-Acute Institutional Stay (SKILLED_NURSING_FACILITY): Payer: Medicare Other | Admitting: Adult Health

## 2019-08-08 DIAGNOSIS — R627 Adult failure to thrive: Secondary | ICD-10-CM | POA: Diagnosis not present

## 2019-08-08 DIAGNOSIS — C50911 Malignant neoplasm of unspecified site of right female breast: Secondary | ICD-10-CM | POA: Diagnosis not present

## 2019-08-08 DIAGNOSIS — F0391 Unspecified dementia with behavioral disturbance: Secondary | ICD-10-CM | POA: Diagnosis not present

## 2019-08-08 NOTE — Progress Notes (Signed)
Location:    [facility] Room Number: 113/W Place of Service:  SNF (31)   CODE STATUS: DNR  Allergies  Allergen Reactions  . Codeine Sulfate [Codeine] Anaphylaxis and Hives  . Morphine And Related Anaphylaxis and Hives  . Penicillins Anaphylaxis  . Ace Inhibitors Cough   Chief Complaint  Patient presents with  . Acute Visit    care plan meeting     HPI:  We have come together for her care plan meeting. She has been diagnosed with right breast cancer. She has been seen by oncology who has determined that she is not a candidate for any treatment. I have spoken with her daughter regarding her advanced care planning. She is a DNR at this time. We have discussed tube feeding the pros and cons; we have discussed hospitalizations the pros and cons; abt and ivf. We have filled out the MOST form. Her daughter would for [place] to treat those illnesses that can be treated here.   Past Medical History:  Diagnosis Date  . Allergy   . Atrial fibrillation ([place]) 08/2011   First diagnosed in 08/2011; duration of arrhythmia is uncertain  . Bilateral lower extremity edema   . Chronic diarrhea    diverticulosis  . COPD (chronic obstructive pulmonary disease) ([place])   . Dementia ([place])   . Diabetes mellitus, type 2 (HCC)    Diabetic neuropathy  . Dyspnea on exertion    pedal edema  . Gout   . Headache(784.0)    twice weekly  . Hyperlipidemia   . Hypertension   . Osteopenia    DEXA scan 01/2010  . Palpitations   . Seasonal allergies   . Stress incontinence   . Vertigo     Past Surgical History:  Procedure Laterality Date  . APPENDECTOMY    . BREAST BIOPSY  2002   Right  . CESAREAN SECTION     x 2  . CHOLECYSTECTOMY    . COLONOSCOPY  2007   Negative screening study  . COLONOSCOPY WITH PROPOFOL N/A 09/18/2017   Procedure: COLONOSCOPY WITH PROPOFOL;  Surgeon: Daneil Dolin, MD;  [facility];  Service: Endoscopy;  Laterality: N/A;  11:00am  . HAMMER  TOE SURGERY     Bilateral hammer toe amputation  . INCISIONAL HERNIA REPAIR    . KNEE ARTHROSCOPY W/ MENISCAL REPAIR  2007   Bilateral  . POLYPECTOMY  09/18/2017   Procedure: POLYPECTOMY;  Surgeon: Daneil Dolin, MD;  [facility];  Service: Endoscopy;;  colon  . UMBILICAL HERNIA REPAIR      Social History   Socioeconomic History  . Marital status: Widowed    Spouse name: Not on file  . Number of children: 2  . Years of education: Not on file  . Highest education level: Not on file  Occupational History  . Occupation: Engineer, materials  Tobacco Use  . Smoking status: Never Smoker  . Smokeless tobacco: Never Used  Substance and Sexual Activity  . Alcohol use: No  . Drug use: No  . Sexual activity: Not Currently  Other Topics Concern  . Not on file  Social History Narrative  . Not on file   Social Determinants of Health   Financial Resource Strain: Low Risk   . Difficulty of Paying Living Expenses: Not hard at all  Food Insecurity: No Food Insecurity  . Worried About Charity fundraiser in the Last Year: Never true  . Ran Out of  Food in the Last Year: Never true  Transportation Needs: No Transportation Needs  . Lack of Transportation (Medical): No  . Lack of Transportation (Non-Medical): No  Physical Activity: Inactive  . Days of Exercise per Week: 0 days  . Minutes of Exercise per Session: 0 min  Stress:   . Feeling of Stress :   Social Connections: Moderately Isolated  . Frequency of Communication with Friends and Family: Never  . Frequency of Social Gatherings with Friends and Family: Never  . Attends Religious Services: Never  . Active Member of Clubs or Organizations: Yes  . Attends Archivist Meetings: Never  . Marital Status: Widowed  Intimate Partner Violence: Not At Risk  . Fear of Current or Ex-Partner: No  . Emotionally Abused: No  . Physically Abused: No  . Sexually Abused: No   Family History  Adopted: Yes      VITAL  SIGNS BP 106/75   Pulse 78   Temp 98.2 F (36.8 C) (Oral)   Resp 18   Ht 5\' 6"  (1.676 m)   Wt 179 lb 6.4 oz (81.4 kg)   SpO2 98%   BMI 28.96 kg/m   Outpatient Encounter Medications as of 08/08/2019  Medication Sig  . acetaminophen (TYLENOL) 325 MG tablet Take 650 mg by mouth every 6 (six) hours as needed.   Marland Kitchen apixaban (ELIQUIS) 5 MG TABS tablet Take 5 mg by mouth 2 (two) times daily.   . carboxymethylcellulose (REFRESH PLUS) 0.5 % SOLN Place 1 drop into both eyes 4 (four) times daily.  . cholecalciferol (VITAMIN D) 1000 UNITS tablet Take 1,000 Units by mouth daily.  Marland Kitchen denosumab (PROLIA) 60 MG/ML SOSY injection Inject 60 mg into the skin every 6 (six) months.  . digoxin (LANOXIN) 0.125 MG tablet Take 1 tablet by mouth once a day (HOLD FOR AP UNDER 60)  . furosemide (LASIX) 20 MG tablet Take 20 mg by mouth daily.  . insulin aspart (NOVOLOG) 100 UNIT/ML injection Inject 17 Units into the skin 3 (three) times daily with meals. If BS above 150  . LANTUS SOLOSTAR 100 UNIT/ML Solostar Pen Inject 53 Units into the skin at bedtime.   . memantine (NAMENDA) 10 MG tablet Take 10 mg by mouth 2 (two) times daily.  . metolazone (ZAROXOLYN) 5 MG tablet Take 5 mg by mouth daily. On Monday and Friday   . metoprolol tartrate (LOPRESSOR) 50 MG tablet Take 50 mg by mouth 2 (two) times daily.  . NON FORMULARY Diet Type:  NAS, consistent CHO, Dysphagia 3 with thin liquids  . NON FORMULARY Give Cranberry Juice twice a day  . omeprazole (PRILOSEC) 40 MG capsule Take 40 mg by mouth daily.  . polyethylene glycol (MIRALAX / GLYCOLAX) packet Take 17 g by mouth daily as needed. daily prn for constipation - mix with 6 oz of liquid and drink  . potassium chloride SA (KLOR-CON) 20 MEQ tablet Take 40 mEq by mouth 4 (four) times daily.   . pravastatin (PRAVACHOL) 20 MG tablet Take 20 mg by mouth daily. Take along with 40 mg to = 60 mg  . pravastatin (PRAVACHOL) 40 MG tablet Take 40 mg by mouth daily. Take along with 20  mg to = 60 mg  . sitaGLIPtin-metformin (JANUMET) 50-500 MG tablet Take 1 tablet by mouth 2 (two) times daily with a meal.  . spironolactone (ALDACTONE) 25 MG tablet Take 12.5 mg by mouth daily. 1/2 Tablet = 12.5mg    No facility-administered encounter medications on file as  of 08/08/2019.     SIGNIFICANT DIAGNOSTIC EXAMS   PREVIOUS  02-21-19: DEXA: t score -3.504  TODAY  07-18-19: right breast ultrasound: Right breast mass which is 3.4 cm x 3.6 cm x 4.4 cm.     LABS REVIEWED PREVIOUS:   08-09-18: wbc 10.3 hgb 12.1; hct 39.2 mcv 92.2 ;plt 242; glucose 86 bun 31; creat 1.08; k+ 3.2; na++ 141; ca 9.3; chol 148; ldl 95; trig 107; hdl 32 hgb a1c 7.9 08-23-18: glucose 174; bun 30; creat 0.99; k+ 3.6; na++ 142; ca 9.1  10-09-18: wbc 8.3; hgb 11.9; hct 38.2; mcv 91.6; plt 232; gluocse 203; bun 29; creat 1.06; k+ 3.5;na++ 137; ca 8.2; tsh 2.608; dig 1.0   11-14-18: dig 0.9 12-18-18: hgb a1c 7.8   01-24-19: urine micro-albumin: 5.3 04-23-19: wbc 9.6; hgb 13.0; hct 41.4; mcv 90.6 plt 280; glucose 187; bun 37 creat 1.36; k+ 3.3; na++ 139; ca 9.3 ;liver normal albumin 3.2 05-01-19: k+ 4.3 05-09-19: k+ 3.3; vit B 12: 480 05-13-19: k+ 3.8 05-31-19: glucose 287; bun 27; creat 1.39; k+ 4.4; na++ 138; ca 9.4  07-04-19: wbc 8.9; hgb 10.7; hct 33.9; mcv 92.1 plt 266; glucose 118; bum 33; creat 1.23; k+ 3.2; na++ 137; ca 9.2; tsh 2.028 hgb a1c 7.1; chol 129 ldl 82 trig 125 hdl 22  07-11-19 k+ 3.3 07-16-19: glucose 112; bun 26; creat 1.15; k+ 4.2; na++ 139; ca 9.3  NO NEW LABS.   Review of Systems  Unable to perform ROS: Dementia (unable to participate )   Physical Exam Constitutional:      General: She is not in acute distress.    Appearance: She is well-developed. She is not diaphoretic.  Neck:     Thyroid: No thyromegaly.  Cardiovascular:     Rate and Rhythm: Normal rate and regular rhythm.     Heart sounds: Normal heart sounds.  Pulmonary:     Effort: Pulmonary effort is normal. No respiratory  distress.     Breath sounds: Normal breath sounds.  Chest:     Comments: Right breast mass Abdominal:     General: Bowel sounds are normal. There is no distension.     Palpations: Abdomen is soft.     Tenderness: There is no abdominal tenderness.  Musculoskeletal:        General: Normal range of motion.     Right lower leg: No edema.     Left lower leg: No edema.  Lymphadenopathy:     Cervical: No cervical adenopathy.  Skin:    General: Skin is warm and dry.  Neurological:     Mental Status: She is alert. Mental status is at baseline.  Psychiatric:        Mood and Affect: Mood normal.       ASSESSMENT/ PLAN:  TODAY  1. Right breast cancer; unspecified side unspecified estrogen receptor status female 2. Dementia with behavioral disturbance unspecified dementia type 3. Failure to thrive in adult  We have filled out the MOST FORM: DNR; no hospitalization; no tube feeding ok for abt and ivf Will stop the following medications: vitamin D; pravachol prolia Will continue to monitor her  status.   Time spent with patient and family: 45 minutes: (20 minutes with advanced directives) discussed MOST form and filled out; medications; goals of care. Verbalized understanding.    MD is aware of resident's narcotic use and is in agreement with current plan of care. We will attempt to wean resident as appropriate.  Ok Edwards NP  [place] [phone] Monday through Friday 8am- 5pm  After hours call [phone]

## 2019-08-12 DIAGNOSIS — C50911 Malignant neoplasm of unspecified site of right female breast: Secondary | ICD-10-CM | POA: Insufficient documentation

## 2019-08-12 DIAGNOSIS — R627 Adult failure to thrive: Secondary | ICD-10-CM | POA: Insufficient documentation

## 2019-08-12 NOTE — ACP (Advance Care Planning) (Signed)
MOST form filled out:  DNR No hospitalization No tube feeding Ok for abt and ivf

## 2019-08-13 DIAGNOSIS — F331 Major depressive disorder, recurrent, moderate: Secondary | ICD-10-CM | POA: Diagnosis not present

## 2019-08-13 DIAGNOSIS — G301 Alzheimer's disease with late onset: Secondary | ICD-10-CM | POA: Diagnosis not present

## 2019-08-14 DIAGNOSIS — F331 Major depressive disorder, recurrent, moderate: Secondary | ICD-10-CM | POA: Diagnosis not present

## 2019-08-14 DIAGNOSIS — F4323 Adjustment disorder with mixed anxiety and depressed mood: Secondary | ICD-10-CM | POA: Diagnosis not present

## 2019-08-21 DIAGNOSIS — F4323 Adjustment disorder with mixed anxiety and depressed mood: Secondary | ICD-10-CM | POA: Diagnosis not present

## 2019-08-21 DIAGNOSIS — F331 Major depressive disorder, recurrent, moderate: Secondary | ICD-10-CM | POA: Diagnosis not present

## 2019-08-23 ENCOUNTER — Non-Acute Institutional Stay (SKILLED_NURSING_FACILITY): Payer: Medicare Other | Admitting: Adult Health

## 2019-08-23 ENCOUNTER — Encounter: Payer: Self-pay | Admitting: Adult Health

## 2019-08-23 DIAGNOSIS — F0391 Unspecified dementia with behavioral disturbance: Secondary | ICD-10-CM

## 2019-08-23 DIAGNOSIS — K219 Gastro-esophageal reflux disease without esophagitis: Secondary | ICD-10-CM

## 2019-08-23 DIAGNOSIS — C50911 Malignant neoplasm of unspecified site of right female breast: Secondary | ICD-10-CM | POA: Diagnosis not present

## 2019-08-23 NOTE — Progress Notes (Signed)
Location:    [facility] Room Number: 113/W Place of Service:  SNF (31)   CODE STATUS: DNR  Allergies  Allergen Reactions  . Codeine Sulfate [Codeine] Anaphylaxis and Hives  . Morphine And Related Anaphylaxis and Hives  . Penicillins Anaphylaxis  . Ace Inhibitors Cough    Chief Complaint  Patient presents with  . Medical Management of Chronic Issues        Right breast cancer:   GERD without esophagitis:   Unspecified dementia with behavioral disturbance       HPI:  She is a 79 year old long term resident of this facility being seen for the management of her chronic illnesses: right breast cancer; gerd dementia. There are no repots of uncontrolled pain; no reports of agitation; is slowly losing weight. No reports of constipation.   Past Medical History:  Diagnosis Date  . Allergy   . Atrial fibrillation ([place]) 08/2011   First diagnosed in 08/2011; duration of arrhythmia is uncertain  . Bilateral lower extremity edema   . Chronic diarrhea    diverticulosis  . COPD (chronic obstructive pulmonary disease) ([place])   . Dementia ([place])   . Diabetes mellitus, type 2 (HCC)    Diabetic neuropathy  . Dyspnea on exertion    pedal edema  . Gout   . Headache(784.0)    twice weekly  . Hyperlipidemia   . Hypertension   . Osteopenia    DEXA scan 01/2010  . Palpitations   . Seasonal allergies   . Stress incontinence   . Vertigo     Past Surgical History:  Procedure Laterality Date  . APPENDECTOMY    . BREAST BIOPSY  2002   Right  . CESAREAN SECTION     x 2  . CHOLECYSTECTOMY    . COLONOSCOPY  2007   Negative screening study  . COLONOSCOPY WITH PROPOFOL N/A 09/18/2017   Procedure: COLONOSCOPY WITH PROPOFOL;  Surgeon: Daneil Dolin, MD;  [facility];  Service: Endoscopy;  Laterality: N/A;  11:00am  . HAMMER TOE SURGERY     Bilateral hammer toe amputation  . INCISIONAL HERNIA REPAIR    . KNEE ARTHROSCOPY W/ MENISCAL REPAIR  2007   Bilateral  . POLYPECTOMY  09/18/2017   Procedure: POLYPECTOMY;  Surgeon: Daneil Dolin, MD;  [facility];  Service: Endoscopy;;  colon  . UMBILICAL HERNIA REPAIR      Social History   Socioeconomic History  . Marital status: Widowed    Spouse name: Not on file  . Number of children: 2  . Years of education: Not on file  . Highest education level: Not on file  Occupational History  . Occupation: Engineer, materials  Tobacco Use  . Smoking status: Never Smoker  . Smokeless tobacco: Never Used  Substance and Sexual Activity  . Alcohol use: No  . Drug use: No  . Sexual activity: Not Currently  Other Topics Concern  . Not on file  Social History Narrative  . Not on file   Social Determinants of Health   Financial Resource Strain: Low Risk   . Difficulty of Paying Living Expenses: Not hard at all  Food Insecurity: No Food Insecurity  . Worried About Charity fundraiser in the Last Year: Never true  . Ran Out of Food in the Last Year: Never true  Transportation Needs: No Transportation Needs  . Lack of Transportation (Medical): No  . Lack of Transportation (Non-Medical): No  Physical Activity: Inactive  . Days of Exercise per Week: 0 days  . Minutes of Exercise per Session: 0 min  Stress:   . Feeling of Stress :   Social Connections: Moderately Isolated  . Frequency of Communication with Friends and Family: Never  . Frequency of Social Gatherings with Friends and Family: Never  . Attends Religious Services: Never  . Active Member of Clubs or Organizations: Yes  . Attends Archivist Meetings: Never  . Marital Status: Widowed  Intimate Partner Violence: Not At Risk  . Fear of Current or Ex-Partner: No  . Emotionally Abused: No  . Physically Abused: No  . Sexually Abused: No   Family History  Adopted: Yes      VITAL SIGNS BP 103/68   Pulse 69   Temp 98.6 F (37 C) (Oral)   Resp 18   Ht 5\' 6"  (1.676 m)   Wt 179 lb 6.4 oz (81.4 kg)   SpO2  98%   BMI 28.96 kg/m   Outpatient Encounter Medications as of 08/23/2019  Medication Sig  . acetaminophen (TYLENOL) 325 MG tablet Take 650 mg by mouth every 6 (six) hours as needed.   Marland Kitchen apixaban (ELIQUIS) 5 MG TABS tablet Take 5 mg by mouth 2 (two) times daily.   . carboxymethylcellulose (REFRESH PLUS) 0.5 % SOLN Place 1 drop into both eyes 4 (four) times daily.  . digoxin (LANOXIN) 0.125 MG tablet Take 1 tablet by mouth once a day (HOLD FOR AP UNDER 60)  . furosemide (LASIX) 20 MG tablet Take 20 mg by mouth daily.  . insulin aspart (NOVOLOG) 100 UNIT/ML injection Inject 17 Units into the skin 3 (three) times daily with meals. If BS above 150  . LANTUS SOLOSTAR 100 UNIT/ML Solostar Pen Inject 53 Units into the skin at bedtime.   . memantine (NAMENDA) 10 MG tablet Take 10 mg by mouth 2 (two) times daily.  . metolazone (ZAROXOLYN) 5 MG tablet Take 5 mg by mouth daily. On Monday and Friday   . metoprolol tartrate (LOPRESSOR) 50 MG tablet Take 50 mg by mouth 2 (two) times daily.  . NON FORMULARY Diet Type:  NAS, consistent CHO, Dysphagia 3 with thin liquids  . NON FORMULARY Give Cranberry Juice twice a day  . omeprazole (PRILOSEC) 40 MG capsule Take 40 mg by mouth daily.  . polyethylene glycol (MIRALAX / GLYCOLAX) packet Take 17 g by mouth daily as needed. daily prn for constipation - mix with 6 oz of liquid and drink  . potassium chloride SA (KLOR-CON) 20 MEQ tablet Take 40 mEq by mouth 4 (four) times daily.   . sitaGLIPtin-metformin (JANUMET) 50-500 MG tablet Take 1 tablet by mouth 2 (two) times daily with a meal.  . spironolactone (ALDACTONE) 25 MG tablet Take 12.5 mg by mouth daily. 1/2 Tablet = 12.5mg   . [DISCONTINUED] cholecalciferol (VITAMIN D) 1000 UNITS tablet Take 1,000 Units by mouth daily.  . [DISCONTINUED] denosumab (PROLIA) 60 MG/ML SOSY injection Inject 60 mg into the skin every 6 (six) months.  . [DISCONTINUED] pravastatin (PRAVACHOL) 20 MG tablet Take 20 mg by mouth daily. Take  along with 40 mg to = 60 mg  . [DISCONTINUED] pravastatin (PRAVACHOL) 40 MG tablet Take 40 mg by mouth daily. Take along with 20 mg to = 60 mg   No facility-administered encounter medications on file as of 08/23/2019.     SIGNIFICANT DIAGNOSTIC EXAMS   PREVIOUS  02-21-19: DEXA: t score -3.504  NO NEW EXAMS.  LABS REVIEWED PREVIOUS:   08-09-18: wbc 10.3 hgb 12.1; hct 39.2 mcv 92.2 ;plt 242; glucose 86 bun 31; creat 1.08; k+ 3.2; na++ 141; ca 9.3; chol 148; ldl 95; trig 107; hdl 32 hgb a1c 7.9 08-23-18: glucose 174; bun 30; creat 0.99; k+ 3.6; na++ 142; ca 9.1  10-09-18: wbc 8.3; hgb 11.9; hct 38.2; mcv 91.6; plt 232; gluocse 203; bun 29; creat 1.06; k+ 3.5;na++ 137; ca 8.2; tsh 2.608; dig 1.0   11-14-18: dig 0.9 12-18-18: hgb a1c 7.8   01-24-19: urine micro-albumin: 5.3 04-23-19: wbc 9.6; hgb 13.0; hct 41.4; mcv 90.6 plt 280; glucose 187; bun 37 creat 1.36; k+ 3.3; na++ 139; ca 9.3 ;liver normal albumin 3.2 05-01-19: k+ 4.3 05-09-19: k+ 3.3; vit B 12: 480 05-13-19: k+ 3.8 05-31-19: glucose 287; bun 27; creat 1.39; k+ 4.4; na++ 138; ca 9.4   TODAY  07-04-30: wbc 8.9; hgb 10.7; hct 33.9; mcv 92.1 plt 266; glucose 118; bun 33; creat 1.23; k+ 3.2; na++ 137; ca 9.2 chol 129; ldl 82 trig 125; hdl 22 07-11-19: k+ 3.3 07-16-19: glucose 112; bun 26; creat 1.15; k+ 4.2; na++ 139; ca 9.3    Review of Systems  Unable to perform ROS: Dementia (unable to participate )   Physical Exam Constitutional:      General: She is not in acute distress.    Appearance: She is well-developed. She is not diaphoretic.  Neck:     Thyroid: No thyromegaly.  Cardiovascular:     Rate and Rhythm: Normal rate. Rhythm irregular.     Heart sounds: Normal heart sounds.  Pulmonary:     Effort: Pulmonary effort is normal. No respiratory distress.     Breath sounds: Normal breath sounds.  Chest:     Comments: Right breast mass  Abdominal:     General: Bowel sounds are normal. There is no distension.     Palpations:  Abdomen is soft.     Tenderness: There is no abdominal tenderness.  Musculoskeletal:     Right lower leg: No edema.     Left lower leg: No edema.     Comments: Is able to move all extremities   Lymphadenopathy:     Cervical: No cervical adenopathy.  Skin:    General: Skin is warm and dry.  Neurological:     Mental Status: She is alert. Mental status is at baseline.  Psychiatric:        Mood and Affect: Mood normal.       ASSESSMENT/ PLAN:  TODAY:   1. Right breast cancer: is without change in status; is not a candidate for any treatment will monitor her status.   2. GERD without esophagitis: is stable will continue prilosec 40 mg daily   3. Unspecified dementia with behavioral disturbance is without significant change in status is slowly losing weight; weight is 179 pounds will continue namenda 10 mg twice daily unfortunately weight loss is an expected outcome in the late stages of this disease process.    PREVIOUS   4. 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 Major depression recurrent chronic: is emotionally stable is off medications will monitor   6. Type 2 diabetes uncontrolled with periperal circulatory disorder: hgb a1c 7.1 will continue junamet 50/500 twice daily basaglar 53 units nightly and novolog 17 units with meals.   7. Dyslipidemia associated with type 2 diabetes mellitus: is stable LDL 125 will continue to  monitor   8. CKD stage 2 due to type 2 diabetes mellitus: is stable bun 26; creat 1.15  9. Hypertension; associated with stage 2 chronic disease due to type 2 diabetes mellitus: is stable b/p 108/63  will continue lopressor 50 mg twice daily   10. Chronic atrial fibrillation: is stable will continue digoxin 0.125 mcg daily lopressor 50 mg twice daily for rate control is on long term eliquis 5 mg twice daily   11. Bilateral lower extremity edema: is stable will continue  lasix 40 mg daily and zaroxolyn 5 mg twice weekly   12. Hypokalemia: is stable k+ 4.2 will continue k+ 40 meq four times daily    MD is aware of resident's narcotic use and is in agreement with current plan of care. We will attempt to wean resident as appropriate.  Ok Edwards NP [facility]  Contact [phone] Monday through Friday 8am- 5pm  After hours call [phone]

## 2019-08-28 DIAGNOSIS — F331 Major depressive disorder, recurrent, moderate: Secondary | ICD-10-CM | POA: Diagnosis not present

## 2019-08-28 DIAGNOSIS — F4323 Adjustment disorder with mixed anxiety and depressed mood: Secondary | ICD-10-CM | POA: Diagnosis not present

## 2019-08-28 DIAGNOSIS — E1140 Type 2 diabetes mellitus with diabetic neuropathy, unspecified: Secondary | ICD-10-CM | POA: Diagnosis not present

## 2019-08-28 DIAGNOSIS — Z1159 Encounter for screening for other viral diseases: Secondary | ICD-10-CM | POA: Diagnosis not present

## 2019-09-05 DIAGNOSIS — F331 Major depressive disorder, recurrent, moderate: Secondary | ICD-10-CM | POA: Diagnosis not present

## 2019-09-05 DIAGNOSIS — F4323 Adjustment disorder with mixed anxiety and depressed mood: Secondary | ICD-10-CM | POA: Diagnosis not present

## 2019-09-11 ENCOUNTER — Other Ambulatory Visit: Payer: Self-pay | Admitting: Adult Health

## 2019-09-11 ENCOUNTER — Non-Acute Institutional Stay (SKILLED_NURSING_FACILITY): Payer: Medicare Other | Admitting: Adult Health

## 2019-09-11 DIAGNOSIS — C50911 Malignant neoplasm of unspecified site of right female breast: Secondary | ICD-10-CM | POA: Diagnosis not present

## 2019-09-11 MED ORDER — TRAMADOL HCL 50 MG PO TABS: 50.0000 mg | ORAL_TABLET | Freq: Four times a day (QID) | ORAL | 0 refills | Status: DC

## 2019-09-13 DIAGNOSIS — G301 Alzheimer's disease with late onset: Secondary | ICD-10-CM | POA: Diagnosis not present

## 2019-09-13 DIAGNOSIS — F331 Major depressive disorder, recurrent, moderate: Secondary | ICD-10-CM | POA: Diagnosis not present

## 2019-09-16 NOTE — Progress Notes (Signed)
Location:   [facility] Room Number: [id number] of Service:  SNF (31)   CODE STATUS: DNR  Allergies  Allergen Reactions  . Codeine Sulfate [Codeine] Anaphylaxis and Hives  . Morphine And Related Anaphylaxis and Hives  . Penicillins Anaphylaxis  . Ace Inhibitors Cough    Chief Complaint  Patient presents with  . Acute Visit    pain management     HPI:  Staff reports that she is experiencing an increased pain in her right breast. She has increased pain when the breast is touched. She is spending more time in bed due to her pain. She has not lost any weight; no reports of changes in appetite.   Past Medical History:  Diagnosis Date  . Allergy   . Atrial fibrillation ([place]) 08/2011   First diagnosed in 08/2011; duration of arrhythmia is uncertain  . Bilateral lower extremity edema   . Chronic diarrhea    diverticulosis  . COPD (chronic obstructive pulmonary disease) ([place])   . Dementia ([place])   . Diabetes mellitus, type 2 (HCC)    Diabetic neuropathy  . Dyspnea on exertion    pedal edema  . Gout   . Headache(784.0)    twice weekly  . Hyperlipidemia   . Hypertension   . Osteopenia    DEXA scan 01/2010  . Palpitations   . Seasonal allergies   . Stress incontinence   . Vertigo     Past Surgical History:  Procedure Laterality Date  . APPENDECTOMY    . BREAST BIOPSY  2002   Right  . CESAREAN SECTION     x 2  . CHOLECYSTECTOMY    . COLONOSCOPY  2007   Negative screening study  . COLONOSCOPY WITH PROPOFOL N/A 09/18/2017   Procedure: COLONOSCOPY WITH PROPOFOL;  Surgeon: Daneil Dolin, MD;  [facility];  Service: Endoscopy;  Laterality: N/A;  11:00am  . HAMMER TOE SURGERY     Bilateral hammer toe amputation  . INCISIONAL HERNIA REPAIR    . KNEE ARTHROSCOPY W/ MENISCAL REPAIR  2007   Bilateral  . POLYPECTOMY  09/18/2017   Procedure: POLYPECTOMY;  Surgeon: Daneil Dolin, MD;  [facility];  Service: Endoscopy;;  colon  . UMBILICAL  HERNIA REPAIR      Social History   Socioeconomic History  . Marital status: Widowed    Spouse name: Not on file  . Number of children: 2  . Years of education: Not on file  . Highest education level: Not on file  Occupational History  . Occupation: Engineer, materials  Tobacco Use  . Smoking status: Never Smoker  . Smokeless tobacco: Never Used  Substance and Sexual Activity  . Alcohol use: No  . Drug use: No  . Sexual activity: Not Currently  Other Topics Concern  . Not on file  Social History Narrative  . Not on file   Social Determinants of Health   Financial Resource Strain: Low Risk   . Difficulty of Paying Living Expenses: Not hard at all  Food Insecurity: No Food Insecurity  . Worried About Charity fundraiser in the Last Year: Never true  . Ran Out of Food in the Last Year: Never true  Transportation Needs: No Transportation Needs  . Lack of Transportation (Medical): No  . Lack of Transportation (Non-Medical): No  Physical Activity: Inactive  . Days of Exercise per Week: 0 days  . Minutes of Exercise per Session: 0 min  Stress:   . Feeling of Stress :   Social Connections: Moderately Isolated  . Frequency of Communication with Friends and Family: Never  . Frequency of Social Gatherings with Friends and Family: Never  . Attends Religious Services: Never  . Active Member of Clubs or Organizations: Yes  . Attends Archivist Meetings: Never  . Marital Status: Widowed  Intimate Partner Violence: Not At Risk  . Fear of Current or Ex-Partner: No  . Emotionally Abused: No  . Physically Abused: No  . Sexually Abused: No   Family History  Adopted: Yes      VITAL SIGNS BP 117/74   Pulse 80   Temp (!) 97.2 F (36.2 C)   Resp 16   Ht 5\' 6"  (1.676 m)   Wt 181 lb 12.8 oz (82.5 kg)   BMI 29.34 kg/m   Outpatient Encounter Medications as of 09/11/2019  Medication Sig  . acetaminophen (TYLENOL) 325 MG tablet Take 650 mg by mouth every 6 (six) hours  as needed.   Marland Kitchen apixaban (ELIQUIS) 5 MG TABS tablet Take 5 mg by mouth 2 (two) times daily.   . carboxymethylcellulose (REFRESH PLUS) 0.5 % SOLN Place 1 drop into both eyes 4 (four) times daily.  . digoxin (LANOXIN) 0.125 MG tablet Take 1 tablet by mouth once a day (HOLD FOR AP UNDER 60)  . furosemide (LASIX) 20 MG tablet Take 20 mg by mouth daily.  . insulin aspart (NOVOLOG) 100 UNIT/ML injection Inject 17 Units into the skin 3 (three) times daily with meals. If BS above 150  . LANTUS SOLOSTAR 100 UNIT/ML Solostar Pen Inject 53 Units into the skin at bedtime.   . memantine (NAMENDA) 10 MG tablet Take 10 mg by mouth 2 (two) times daily.  . metolazone (ZAROXOLYN) 5 MG tablet Take 5 mg by mouth daily. On Monday and Friday   . metoprolol tartrate (LOPRESSOR) 50 MG tablet Take 50 mg by mouth 2 (two) times daily.  . NON FORMULARY Diet Type:  NAS, consistent CHO, Dysphagia 3 with thin liquids  . NON FORMULARY Give Cranberry Juice twice a day  . omeprazole (PRILOSEC) 40 MG capsule Take 40 mg by mouth daily.  . polyethylene glycol (MIRALAX / GLYCOLAX) packet Take 17 g by mouth daily as needed. daily prn for constipation - mix with 6 oz of liquid and drink  . potassium chloride SA (KLOR-CON) 20 MEQ tablet Take 40 mEq by mouth 4 (four) times daily.   . sitaGLIPtin-metformin (JANUMET) 50-500 MG tablet Take 1 tablet by mouth 2 (two) times daily with a meal.  . spironolactone (ALDACTONE) 25 MG tablet Take 12.5 mg by mouth daily. 1/2 Tablet = 12.5mg   . traMADol (ULTRAM) 50 MG tablet Take 1 tablet (50 mg total) by mouth every 6 (six) hours.   No facility-administered encounter medications on file as of 09/11/2019.     SIGNIFICANT DIAGNOSTIC EXAMS   PREVIOUS  02-21-19: DEXA: t score -3.504  NO NEW EXAMS.    LABS REVIEWED PREVIOUS:   10-09-18: wbc 8.3; hgb 11.9; hct 38.2; mcv 91.6; plt 232; gluocse 203; bun 29; creat 1.06; k+ 3.5;na++ 137; ca 8.2; tsh 2.608; dig 1.0   11-14-18: dig 0.9 12-18-18: hgb  a1c 7.8   01-24-19: urine micro-albumin: 5.3 04-23-19: wbc 9.6; hgb 13.0; hct 41.4; mcv 90.6 plt 280; glucose 187; bun 37 creat 1.36; k+ 3.3; na++ 139; ca 9.3 ;liver normal albumin 3.2 05-01-19: k+ 4.3 05-09-19: k+ 3.3; vit B 12: 480 05-13-19: k+ 3.8  05-31-19: glucose 287; bun 27; creat 1.39; k+ 4.4; na++ 138; ca 9.4 07-04-30: wbc 8.9; hgb 10.7; hct 33.9; mcv 92.1 plt 266; glucose 118; bun 33; creat 1.23; k+ 3.2; na++ 137; ca 9.2 chol 129; ldl 82 trig 125; hdl 22 07-11-19: k+ 3.3 07-16-19: glucose 112; bun 26; creat 1.15; k+ 4.2; na++ 139; ca 9.3    NO NEW LABS   Review of Systems  Unable to perform ROS: Dementia (unable to participate )    Physical Exam Constitutional:      General: She is not in acute distress.    Appearance: She is well-developed. She is not diaphoretic.  Neck:     Thyroid: No thyromegaly.  Cardiovascular:     Rate and Rhythm: Normal rate. Rhythm irregular.     Pulses: Normal pulses.     Heart sounds: Normal heart sounds.  Pulmonary:     Effort: Pulmonary effort is normal. No respiratory distress.     Breath sounds: Normal breath sounds.  Chest:     Comments: Right breast mass;large and painful to touch  Abdominal:     General: Bowel sounds are normal. There is no distension.     Palpations: Abdomen is soft.     Tenderness: There is no abdominal tenderness.  Musculoskeletal:     Cervical back: Neck supple.     Right lower leg: No edema.     Left lower leg: No edema.     Comments: Able to move all extremities   Lymphadenopathy:     Cervical: No cervical adenopathy.  Skin:    General: Skin is warm and dry.  Neurological:     Mental Status: She is alert. Mental status is at baseline.  Psychiatric:        Mood and Affect: Mood normal.    ASSESSMENT/ PLAN:  TODAY  1. Right breast cancer Her status is worse:  Will begin ultram 50 mg every 6 hours along with tylenol 650 mg every 6 hours  Will monitor her status.      MD is aware of resident's  narcotic use and is in agreement with current plan of care. We will attempt to wean resident as appropriate.  Ok Edwards NP [facility]  Contact [phone] Monday through Friday 8am- 5pm  After hours call [phone]

## 2019-09-17 ENCOUNTER — Encounter: Payer: Self-pay | Admitting: Adult Health

## 2019-09-17 ENCOUNTER — Non-Acute Institutional Stay (SKILLED_NURSING_FACILITY): Payer: Medicare Other | Admitting: Adult Health

## 2019-09-17 DIAGNOSIS — C50911 Malignant neoplasm of unspecified site of right female breast: Secondary | ICD-10-CM

## 2019-09-17 NOTE — Progress Notes (Signed)
Location:    [place] Room Number: 113/W Place of Service:  SNF (31)   CODE STATUS: DNR  Allergies  Allergen Reactions  . Codeine Sulfate [Codeine] Anaphylaxis and Hives  . Morphine And Related Anaphylaxis and Hives  . Penicillins Anaphylaxis  . Ace Inhibitors Cough    Chief Complaint  Patient presents with  . Acute Visit    Pain    HPI:  Staff reports that she is having increased pain in her right breast. She is spending more time in bed. There are no reports of changes in appetite. She is tolerating the routine ultram.   Past Medical History:  Diagnosis Date  . Allergy   . Atrial fibrillation ([place]) 08/2011   First diagnosed in 08/2011; duration of arrhythmia is uncertain  . Bilateral lower extremity edema   . Chronic diarrhea    diverticulosis  . COPD (chronic obstructive pulmonary disease) ([place])   . Dementia ([place])   . Diabetes mellitus, type 2 (HCC)    Diabetic neuropathy  . Dyspnea on exertion    pedal edema  . Gout   . Headache(784.0)    twice weekly  . Hyperlipidemia   . Hypertension   . Osteopenia    DEXA scan 01/2010  . Palpitations   . Seasonal allergies   . Stress incontinence   . Vertigo     Past Surgical History:  Procedure Laterality Date  . APPENDECTOMY    . BREAST BIOPSY  2002   Right  . CESAREAN SECTION     x 2  . CHOLECYSTECTOMY    . COLONOSCOPY  2007   Negative screening study  . COLONOSCOPY WITH PROPOFOL N/A 09/18/2017   Procedure: COLONOSCOPY WITH PROPOFOL;  Surgeon: Daneil Dolin, MD;  [facility];  Service: Endoscopy;  Laterality: N/A;  11:00am  . HAMMER TOE SURGERY     Bilateral hammer toe amputation  . INCISIONAL HERNIA REPAIR    . KNEE ARTHROSCOPY W/ MENISCAL REPAIR  2007   Bilateral  . POLYPECTOMY  09/18/2017   Procedure: POLYPECTOMY;  Surgeon: Daneil Dolin, MD;  Location: [facility];  Service: Endoscopy;;  colon  . UMBILICAL HERNIA REPAIR      Social History   Socioeconomic  History  . Marital status: Widowed    Spouse name: Not on file  . Number of children: 2  . Years of education: Not on file  . Highest education level: Not on file  Occupational History  . Occupation: Engineer, materials  Tobacco Use  . Smoking status: Never Smoker  . Smokeless tobacco: Never Used  Substance and Sexual Activity  . Alcohol use: No  . Drug use: No  . Sexual activity: Not Currently  Other Topics Concern  . Not on file  Social History Narrative  . Not on file   Social Determinants of Health   Financial Resource Strain: Low Risk   . Difficulty of Paying Living Expenses: Not hard at all  Food Insecurity: No Food Insecurity  . Worried About Charity fundraiser in the Last Year: Never true  . Ran Out of Food in the Last Year: Never true  Transportation Needs: No Transportation Needs  . Lack of Transportation (Medical): No  . Lack of Transportation (Non-Medical): No  Physical Activity: Inactive  . Days of Exercise per Week: 0 days  . Minutes of Exercise per Session: 0 min  Stress:   . Feeling of Stress :   Social Connections: Moderately  Isolated  . Frequency of Communication with Friends and Family: Never  . Frequency of Social Gatherings with Friends and Family: Never  . Attends Religious Services: Never  . Active Member of Clubs or Organizations: Yes  . Attends Archivist Meetings: Never  . Marital Status: Widowed  Intimate Partner Violence: Not At Risk  . Fear of Current or Ex-Partner: No  . Emotionally Abused: No  . Physically Abused: No  . Sexually Abused: No   Family History  Adopted: Yes      VITAL SIGNS BP 109/76   Pulse 84   Temp 98.4 F (36.9 C) (Oral)   Resp 20   Ht 5\' 6"  (1.676 m)   Wt 181 lb 12.8 oz (82.5 kg)   SpO2 98%   BMI 29.34 kg/m   Outpatient Encounter Medications as of 09/17/2019  Medication Sig  . acetaminophen (TYLENOL) 325 MG tablet Take 650 mg by mouth every 6 (six) hours as needed.   Marland Kitchen apixaban (ELIQUIS) 5 MG  TABS tablet Take 5 mg by mouth 2 (two) times daily.   . carboxymethylcellulose (REFRESH PLUS) 0.5 % SOLN Place 1 drop into both eyes 4 (four) times daily.  . digoxin (LANOXIN) 0.125 MG tablet Take 1 tablet by mouth once a day (HOLD FOR AP UNDER 60)  . furosemide (LASIX) 20 MG tablet Take 20 mg by mouth daily.  . insulin aspart (NOVOLOG) 100 UNIT/ML injection Inject 17 Units into the skin 3 (three) times daily with meals. If BS above 150  . LANTUS SOLOSTAR 100 UNIT/ML Solostar Pen Inject 53 Units into the skin at bedtime.   . memantine (NAMENDA) 10 MG tablet Take 10 mg by mouth 2 (two) times daily.  . metolazone (ZAROXOLYN) 5 MG tablet Take 5 mg by mouth daily. On Monday and Friday   . metoprolol tartrate (LOPRESSOR) 50 MG tablet Take 50 mg by mouth 2 (two) times daily.  . NON FORMULARY Diet Type:  NAS, consistent CHO, Dysphagia 3 with thin liquids  . NON FORMULARY Give Cranberry Juice twice a day  . omeprazole (PRILOSEC) 40 MG capsule Take 40 mg by mouth daily.  . polyethylene glycol (MIRALAX / GLYCOLAX) packet Take 17 g by mouth daily as needed. daily prn for constipation - mix with 6 oz of liquid and drink  . potassium chloride SA (KLOR-CON) 20 MEQ tablet Take 40 mEq by mouth 4 (four) times daily.   . sitaGLIPtin-metformin (JANUMET) 50-500 MG tablet Take 1 tablet by mouth 2 (two) times daily with a meal.  . spironolactone (ALDACTONE) 25 MG tablet Take 12.5 mg by mouth daily. 1/2 Tablet = 12.5mg   . traMADol (ULTRAM) 50 MG tablet Take 1 tablet (50 mg total) by mouth every 6 (six) hours.   No facility-administered encounter medications on file as of 09/17/2019.     SIGNIFICANT DIAGNOSTIC EXAMS   PREVIOUS  02-21-19: DEXA: t score -3.504  NO NEW EXAMS.    LABS REVIEWED PREVIOUS:   10-09-18: wbc 8.3; hgb 11.9; hct 38.2; mcv 91.6; plt 232; gluocse 203; bun 29; creat 1.06; k+ 3.5;na++ 137; ca 8.2; tsh 2.608; dig 1.0   11-14-18: dig 0.9 12-18-18: hgb a1c 7.8   01-24-19: urine micro-albumin:  5.3 04-23-19: wbc 9.6; hgb 13.0; hct 41.4; mcv 90.6 plt 280; glucose 187; bun 37 creat 1.36; k+ 3.3; na++ 139; ca 9.3 ;liver normal albumin 3.2 05-01-19: k+ 4.3 05-09-19: k+ 3.3; vit B 12: 480 05-13-19: k+ 3.8 05-31-19: glucose 287; bun 27; creat 1.39; k+ 4.4;  na++ 138; ca 9.4 07-04-30: wbc 8.9; hgb 10.7; hct 33.9; mcv 92.1 plt 266; glucose 118; bun 33; creat 1.23; k+ 3.2; na++ 137; ca 9.2 chol 129; ldl 82 trig 125; hdl 22 07-11-19: k+ 3.3 07-16-19: glucose 112; bun 26; creat 1.15; k+ 4.2; na++ 139; ca 9.3    NO NEW LABS   Review of Systems  Unable to perform ROS: Dementia (unable to participate )    Physical Exam Constitutional:      General: She is not in acute distress.    Appearance: She is well-developed. She is not diaphoretic.  Neck:     Thyroid: No thyromegaly.  Cardiovascular:     Rate and Rhythm: Normal rate. Rhythm irregular.     Pulses: Normal pulses.     Heart sounds: Normal heart sounds.  Pulmonary:     Effort: Pulmonary effort is normal. No respiratory distress.     Breath sounds: Normal breath sounds.  Chest:     Comments: Right breast mass;large and painful to touch  Abdominal:     General: Bowel sounds are normal. There is no distension.     Palpations: Abdomen is soft.     Tenderness: There is no abdominal tenderness.  Musculoskeletal:     Cervical back: Neck supple.     Right lower leg: No edema.     Left lower leg: No edema.     Comments: Is able to move all extremities   Lymphadenopathy:     Cervical: No cervical adenopathy.  Skin:    General: Skin is warm and dry.  Neurological:     Mental Status: She is alert. Mental status is at baseline.  Psychiatric:        Mood and Affect: Mood normal.      ASSESSMENT/ PLAN:  TODAY  1. Right breast cancer:  Pain is worse:  Will change to tylenol 650 mg every 6 hours Will continue ultram 50 mg every 6 hours     MD is aware of resident's narcotic use and is in agreement with current plan of care. We  will attempt to wean resident as appropriate.  Ok Edwards NP [facility]  Contact [phone] Monday through Friday 8am- 5pm  After hours call [phone]

## 2019-09-18 ENCOUNTER — Encounter: Payer: Self-pay | Admitting: Adult Health

## 2019-09-18 ENCOUNTER — Non-Acute Institutional Stay (SKILLED_NURSING_FACILITY): Payer: Medicare Other | Admitting: Adult Health

## 2019-09-18 DIAGNOSIS — F0391 Unspecified dementia with behavioral disturbance: Secondary | ICD-10-CM

## 2019-09-18 DIAGNOSIS — C50911 Malignant neoplasm of unspecified site of right female breast: Secondary | ICD-10-CM | POA: Diagnosis not present

## 2019-09-18 DIAGNOSIS — R627 Adult failure to thrive: Secondary | ICD-10-CM | POA: Diagnosis not present

## 2019-09-18 DIAGNOSIS — F4323 Adjustment disorder with mixed anxiety and depressed mood: Secondary | ICD-10-CM | POA: Diagnosis not present

## 2019-09-18 DIAGNOSIS — F331 Major depressive disorder, recurrent, moderate: Secondary | ICD-10-CM | POA: Diagnosis not present

## 2019-09-18 NOTE — Progress Notes (Signed)
Location:    [facility] Room Number: 113/W Place of Service:  SNF (31)   CODE STATUS: DNR  Allergies  Allergen Reactions  . Codeine Sulfate [Codeine] Anaphylaxis and Hives  . Morphine And Related Anaphylaxis and Hives  . Penicillins Anaphylaxis  . Ace Inhibitors Cough    Chief Complaint  Patient presents with  . Acute Visit    Care Plan Meeting    HPI:  We have come together for her care plan meeting her family is present. Her daughter is questioning the need for palliative mastectomy for her cancer. We have discussed breast cancer; outcomes; needed treatments in the future. We discussed possible outcomes of surgical intervention. Her pain is presently being managed. She continues to eat. No reports of constipation present.   Past Medical History:  Diagnosis Date  . Allergy   . Atrial fibrillation ([place]) 08/2011   First diagnosed in 08/2011; duration of arrhythmia is uncertain  . Bilateral lower extremity edema   . Chronic diarrhea    diverticulosis  . COPD (chronic obstructive pulmonary disease) ([place])   . Dementia ([place])   . Diabetes mellitus, type 2 (HCC)    Diabetic neuropathy  . Dyspnea on exertion    pedal edema  . Gout   . Headache(784.0)    twice weekly  . Hyperlipidemia   . Hypertension   . Osteopenia    DEXA scan 01/2010  . Palpitations   . Seasonal allergies   . Stress incontinence   . Vertigo     Past Surgical History:  Procedure Laterality Date  . APPENDECTOMY    . BREAST BIOPSY  2002   Right  . CESAREAN SECTION     x 2  . CHOLECYSTECTOMY    . COLONOSCOPY  2007   Negative screening study  . COLONOSCOPY WITH PROPOFOL N/A 09/18/2017   Procedure: COLONOSCOPY WITH PROPOFOL;  Surgeon: Daneil Dolin, MD;  [facility];  Service: Endoscopy;  Laterality: N/A;  11:00am  . HAMMER TOE SURGERY     Bilateral hammer toe amputation  . INCISIONAL HERNIA REPAIR    . KNEE ARTHROSCOPY W/ MENISCAL REPAIR  2007   Bilateral  . POLYPECTOMY   09/18/2017   Procedure: POLYPECTOMY;  Surgeon: Daneil Dolin, MD;  [facility];  Service: Endoscopy;;  colon  . UMBILICAL HERNIA REPAIR      Social History   Socioeconomic History  . Marital status: Widowed    Spouse name: Not on file  . Number of children: 2  . Years of education: Not on file  . Highest education level: Not on file  Occupational History  . Occupation: Engineer, materials  Tobacco Use  . Smoking status: Never Smoker  . Smokeless tobacco: Never Used  Substance and Sexual Activity  . Alcohol use: No  . Drug use: No  . Sexual activity: Not Currently  Other Topics Concern  . Not on file  Social History Narrative  . Not on file   Social Determinants of Health   Financial Resource Strain: Low Risk   . Difficulty of Paying Living Expenses: Not hard at all  Food Insecurity: No Food Insecurity  . Worried About Charity fundraiser in the Last Year: Never true  . Ran Out of Food in the Last Year: Never true  Transportation Needs: No Transportation Needs  . Lack of Transportation (Medical): No  . Lack of Transportation (Non-Medical): No  Physical Activity: Inactive  . Days of Exercise per Week:  0 days  . Minutes of Exercise per Session: 0 min  Stress:   . Feeling of Stress :   Social Connections: Moderately Isolated  . Frequency of Communication with Friends and Family: Never  . Frequency of Social Gatherings with Friends and Family: Never  . Attends Religious Services: Never  . Active Member of Clubs or Organizations: Yes  . Attends Archivist Meetings: Never  . Marital Status: Widowed  Intimate Partner Violence: Not At Risk  . Fear of Current or Ex-Partner: No  . Emotionally Abused: No  . Physically Abused: No  . Sexually Abused: No   Family History  Adopted: Yes      VITAL SIGNS BP 109/76   Pulse 84   Temp 98.1 F (36.7 C) (Oral)   Resp 20   Ht 5\' 6"  (1.676 m)   Wt 181 lb 12.8 oz (82.5 kg)   SpO2 98%   BMI 29.34 kg/m     Outpatient Encounter Medications as of 09/18/2019  Medication Sig  . acetaminophen (TYLENOL) 325 MG tablet Take 650 mg by mouth every 6 (six) hours as needed.   Marland Kitchen apixaban (ELIQUIS) 5 MG TABS tablet Take 5 mg by mouth 2 (two) times daily.   . carboxymethylcellulose (REFRESH PLUS) 0.5 % SOLN Place 1 drop into both eyes 4 (four) times daily.  . digoxin (LANOXIN) 0.125 MG tablet Take 1 tablet by mouth once a day (HOLD FOR AP UNDER 60)  . furosemide (LASIX) 20 MG tablet Take 20 mg by mouth daily.  . insulin aspart (NOVOLOG) 100 UNIT/ML injection Inject 17 Units into the skin 3 (three) times daily with meals. If BS above 150  . LANTUS SOLOSTAR 100 UNIT/ML Solostar Pen Inject 53 Units into the skin at bedtime.   . memantine (NAMENDA) 10 MG tablet Take 10 mg by mouth 2 (two) times daily.  . metolazone (ZAROXOLYN) 5 MG tablet Take 5 mg by mouth daily. On Monday and Friday   . metoprolol tartrate (LOPRESSOR) 50 MG tablet Take 50 mg by mouth 2 (two) times daily.  . NON FORMULARY Diet Type:  NAS, consistent CHO, Dysphagia 3 with thin liquids  . NON FORMULARY Give Cranberry Juice twice a day  . omeprazole (PRILOSEC) 40 MG capsule Take 40 mg by mouth daily.  . polyethylene glycol (MIRALAX / GLYCOLAX) packet Take 17 g by mouth daily as needed. daily prn for constipation - mix with 6 oz of liquid and drink  . potassium chloride SA (KLOR-CON) 20 MEQ tablet Take 40 mEq by mouth 4 (four) times daily.   . sitaGLIPtin-metformin (JANUMET) 50-500 MG tablet Take 1 tablet by mouth 2 (two) times daily with a meal.  . spironolactone (ALDACTONE) 25 MG tablet Take 12.5 mg by mouth daily. 1/2 Tablet = 12.5mg   . traMADol (ULTRAM) 50 MG tablet Take 1 tablet (50 mg total) by mouth every 6 (six) hours.   No facility-administered encounter medications on file as of 09/18/2019.     SIGNIFICANT DIAGNOSTIC EXAMS   PREVIOUS  02-21-19: DEXA: t score -3.504  NO NEW EXAMS.    LABS REVIEWED PREVIOUS:   10-09-18: wbc 8.3;  hgb 11.9; hct 38.2; mcv 91.6; plt 232; gluocse 203; bun 29; creat 1.06; k+ 3.5;na++ 137; ca 8.2; tsh 2.608; dig 1.0   11-14-18: dig 0.9 12-18-18: hgb a1c 7.8   01-24-19: urine micro-albumin: 5.3 04-23-19: wbc 9.6; hgb 13.0; hct 41.4; mcv 90.6 plt 280; glucose 187; bun 37 creat 1.36; k+ 3.3; na++ 139; ca 9.3 ;  liver normal albumin 3.2 05-01-19: k+ 4.3 05-09-19: k+ 3.3; vit B 12: 480 05-13-19: k+ 3.8 05-31-19: glucose 287; bun 27; creat 1.39; k+ 4.4; na++ 138; ca 9.4 07-04-30: wbc 8.9; hgb 10.7; hct 33.9; mcv 92.1 plt 266; glucose 118; bun 33; creat 1.23; k+ 3.2; na++ 137; ca 9.2 chol 129; ldl 82 trig 125; hdl 22 07-11-19: k+ 3.3 07-16-19: glucose 112; bun 26; creat 1.15; k+ 4.2; na++ 139; ca 9.3    NO NEW LABS   Review of Systems  Unable to perform ROS: Dementia (unable to participate )    Physical Exam Constitutional:      General: She is not in acute distress.    Appearance: She is well-developed. She is not diaphoretic.  Neck:     Thyroid: No thyromegaly.  Cardiovascular:     Rate and Rhythm: Normal rate. Rhythm irregular.     Pulses: Normal pulses.     Heart sounds: Normal heart sounds.  Pulmonary:     Effort: Pulmonary effort is normal. No respiratory distress.     Breath sounds: Normal breath sounds.  Chest:     Comments: Right breast mass;large and painful to touch  Abdominal:     General: Bowel sounds are normal. There is no distension.     Palpations: Abdomen is soft.     Tenderness: There is no abdominal tenderness.  Musculoskeletal:     Cervical back: Neck supple.     Right lower leg: No edema.     Left lower leg: No edema.     Comments:  Is able to move all extremities    Lymphadenopathy:     Cervical: No cervical adenopathy.  Skin:    General: Skin is warm and dry.  Neurological:     Mental Status: She is alert. Mental status is at baseline.  Psychiatric:        Mood and Affect: Mood normal.      ASSESSMENT/ PLAN:  TODAY  1. Failure to thrive in adult 2.  Dementia without behavioral disturbance unspecified dementia type 3. Right breast cancer  Her family has decided on no tube feeding. Remains DNR MOST form given to family to consider further interventions Family to contact oncology to discuss possible palliative surgical intervention.   Time spent with patient and family 60 minutes (45 minutes discussed advanced directives) at this time they wish to consider how to fill out MOST form pending a conversation with oncology.        MD is aware of resident's narcotic use and is in agreement with current plan of care. We will attempt to wean resident as appropriate.  Ok Edwards NP [facility]  Contact [phone] Monday through Friday 8am- 5pm  After hours call [phone]

## 2019-09-24 ENCOUNTER — Encounter: Payer: Self-pay | Admitting: Adult Health

## 2019-09-24 ENCOUNTER — Non-Acute Institutional Stay (SKILLED_NURSING_FACILITY): Payer: Medicare Other | Admitting: Adult Health

## 2019-09-24 ENCOUNTER — Encounter (HOSPITAL_COMMUNITY): Payer: Self-pay | Admitting: *Deleted

## 2019-09-24 DIAGNOSIS — F339 Major depressive disorder, recurrent, unspecified: Secondary | ICD-10-CM | POA: Diagnosis not present

## 2019-09-24 DIAGNOSIS — M810 Age-related osteoporosis without current pathological fracture: Secondary | ICD-10-CM | POA: Diagnosis not present

## 2019-09-24 DIAGNOSIS — C50911 Malignant neoplasm of unspecified site of right female breast: Secondary | ICD-10-CM

## 2019-09-24 NOTE — Progress Notes (Signed)
Location:    [facility] Room Number: 113/W Place of Service:  SNF (31)   CODE STATUS: DNR  Allergies  Allergen Reactions  . Codeine Sulfate [Codeine] Anaphylaxis and Hives  . Morphine And Related Anaphylaxis and Hives  . Penicillins Anaphylaxis  . Ace Inhibitors Cough    Chief Complaint  Patient presents with  . Medical Management of Chronic Issues        Right breast cancer:  Age related osteoporosis without current pathological fracture: is Major depression current chronic    HPI:  She is a 79 year old long term resident of this facility being seen for the management of her chronic illnesses: right breast cancer; osteoporosis; depression. There are no reports of uncontrolled pain; no reports of agitation or anxiety. She has an upcoming appointment to see if she is a candidate for a palliative mastectomy.   Past Medical History:  Diagnosis Date  . Allergy   . Atrial fibrillation ([place]) 08/2011   First diagnosed in 08/2011; duration of arrhythmia is uncertain  . Bilateral lower extremity edema   . Chronic diarrhea    diverticulosis  . COPD (chronic obstructive pulmonary disease) ([place])   . Dementia ([place])   . Diabetes mellitus, type 2 (H[facility])    Diabetic neuropathy  . Dyspnea on exertion    pedal edema  . Gout   . Headache(784.0)    twice weekly  . Hyperlipidemia   . Hypertension   . Osteopenia    DEXA scan 01/2010  . Palpitations   . Seasonal allergies   . Stress incontinence   . Vertigo     Past Surgical History:  Procedure Laterality Date  . APPENDECTOMY    . BREAST BIOPSY  2002   Right  . CESAREAN SECTION     x 2  . CHOLECYSTECTOMY    . COLONOSCOPY  2007   Negative screening study  . COLONOSCOPY WITH PROPOFOL N/A 09/18/2017   Procedure: COLONOSCOPY WITH PROPOFOL;  Surgeon: Daneil Dolin, MD;  [facility];  Service: Endoscopy;  Laterality: N/A;  11:00am  . HAMMER TOE SURGERY     Bilateral hammer toe amputation  .  INCISIONAL HERNIA REPAIR    . KNEE ARTHROSCOPY W/ MENISCAL REPAIR  2007   Bilateral  . POLYPECTOMY  09/18/2017   Procedure: POLYPECTOMY;  Surgeon: Daneil Dolin, MD;  [facility];  Service: Endoscopy;;  colon  . UMBILICAL HERNIA REPAIR      Social History   Socioeconomic History  . Marital status: Widowed    Spouse name: Not on file  . Number of children: 2  . Years of education: Not on file  . Highest education level: Not on file  Occupational History  . Occupation: Engineer, materials  Tobacco Use  . Smoking status: Never Smoker  . Smokeless tobacco: Never Used  Substance and Sexual Activity  . Alcohol use: No  . Drug use: No  . Sexual activity: Not Currently  Other Topics Concern  . Not on file  Social History Narrative  . Not on file   Social Determinants of Health   Financial Resource Strain: Low Risk   . Difficulty of Paying Living Expenses: Not hard at all  Food Insecurity: No Food Insecurity  . Worried About Charity fundraiser in the Last Year: Never true  . Ran Out of Food in the Last Year: Never true  Transportation Needs: No Transportation Needs  . Lack of Transportation (Medical):  No  . Lack of Transportation (Non-Medical): No  Physical Activity: Inactive  . Days of Exercise per Week: 0 days  . Minutes of Exercise per Session: 0 min  Stress:   . Feeling of Stress :   Social Connections: Moderately Isolated  . Frequency of Communication with Friends and Family: Never  . Frequency of Social Gatherings with Friends and Family: Never  . Attends Religious Services: Never  . Active Member of Clubs or Organizations: Yes  . Attends Archivist Meetings: Never  . Marital Status: Widowed  Intimate Partner Violence: Not At Risk  . Fear of Current or Ex-Partner: No  . Emotionally Abused: No  . Physically Abused: No  . Sexually Abused: No   Family History  Adopted: Yes      VITAL SIGNS BP 117/74   Pulse 72   Temp 97.9 F (36.6 C)  (Oral)   Resp 20   Ht 5\' 6"  (1.676 m)   Wt 181 lb 12.8 oz (82.5 kg)   SpO2 98%   BMI 29.34 kg/m   Outpatient Encounter Medications as of 09/24/2019  Medication Sig  . acetaminophen (TYLENOL) 325 MG tablet Take 650 mg by mouth every 6 (six) hours as needed.   Marland Kitchen apixaban (ELIQUIS) 5 MG TABS tablet Take 5 mg by mouth 2 (two) times daily.   . carboxymethylcellulose (REFRESH PLUS) 0.5 % SOLN Place 1 drop into both eyes 4 (four) times daily.  . digoxin (LANOXIN) 0.125 MG tablet Take 1 tablet by mouth once a day (HOLD FOR AP UNDER 60)  . furosemide (LASIX) 20 MG tablet Take 20 mg by mouth daily.  . insulin aspart (NOVOLOG) 100 UNIT/ML injection Inject 17 Units into the skin 3 (three) times daily with meals. If BS above 150  . LANTUS SOLOSTAR 100 UNIT/ML Solostar Pen Inject 53 Units into the skin at bedtime.   . memantine (NAMENDA) 10 MG tablet Take 10 mg by mouth 2 (two) times daily.  . metolazone (ZAROXOLYN) 5 MG tablet Take 5 mg by mouth daily. On Monday and Friday   . metoprolol tartrate (LOPRESSOR) 50 MG tablet Take 50 mg by mouth 2 (two) times daily.  . NON FORMULARY Diet Type:  NAS, consistent CHO, Dysphagia 3 with thin liquids  . NON FORMULARY Give Cranberry Juice twice a day  . omeprazole (PRILOSEC) 40 MG capsule Take 40 mg by mouth daily.  . polyethylene glycol (MIRALAX / GLYCOLAX) packet Take 17 g by mouth daily as needed. daily prn for constipation - mix with 6 oz of liquid and drink  . potassium chloride SA (KLOR-CON) 20 MEQ tablet Take 40 mEq by mouth 4 (four) times daily.   . sitaGLIPtin-metformin (JANUMET) 50-500 MG tablet Take 1 tablet by mouth 2 (two) times daily with a meal.  . spironolactone (ALDACTONE) 25 MG tablet Take 12.5 mg by mouth daily. 1/2 Tablet = 12.5mg   . traMADol (ULTRAM) 50 MG tablet Take 1 tablet (50 mg total) by mouth every 6 (six) hours.   No facility-administered encounter medications on file as of 09/24/2019.     SIGNIFICANT DIAGNOSTIC EXAMS   PREVIOUS   02-21-19: DEXA: t score -3.504  NO NEW EXAMS.    LABS REVIEWED PREVIOUS:   10-09-18: wbc 8.3; hgb 11.9; hct 38.2; mcv 91.6; plt 232; gluocse 203; bun 29; creat 1.06; k+ 3.5;na++ 137; ca 8.2; tsh 2.608; dig 1.0   11-14-18: dig 0.9 12-18-18: hgb a1c 7.8   01-24-19: urine micro-albumin: 5.3 04-23-19: wbc 9.6; hgb 13.0;  hct 41.4; mcv 90.6 plt 280; glucose 187; bun 37 creat 1.36; k+ 3.3; na++ 139; ca 9.3 ;liver normal albumin 3.2 05-01-19: k+ 4.3 05-09-19: k+ 3.3; vit B 12: 480 05-13-19: k+ 3.8 05-31-19: glucose 287; bun 27; creat 1.39; k+ 4.4; na++ 138; ca 9.4 07-04-30: wbc 8.9; hgb 10.7; hct 33.9; mcv 92.1 plt 266; glucose 118; bun 33; creat 1.23; k+ 3.2; na++ 137; ca 9.2 chol 129; ldl 82 trig 125; hdl 22 07-11-19: k+ 3.3 07-16-19: glucose 112; bun 26; creat 1.15; k+ 4.2; na++ 139; ca 9.3    NO NEW LABS  Review of Systems  Unable to perform ROS: Dementia (unable to participate )    Physical Exam Constitutional:      General: She is not in acute distress.    Appearance: She is well-developed. She is not diaphoretic.  Neck:     Thyroid: No thyromegaly.  Cardiovascular:     Rate and Rhythm: Normal rate. Rhythm irregular.     Heart sounds: Normal heart sounds.  Pulmonary:     Effort: Pulmonary effort is normal. No respiratory distress.     Breath sounds: Normal breath sounds.  Chest:     Comments: Right breast mass;large and painful to touch  Abdominal:     General: Bowel sounds are normal. There is no distension.     Palpations: Abdomen is soft.     Tenderness: There is no abdominal tenderness.  Musculoskeletal:     Cervical back: Neck supple.     Right lower leg: No edema.     Left lower leg: No edema.     Comments: Is able to move all extremities     Lymphadenopathy:     Cervical: No cervical adenopathy.  Skin:    General: Skin is warm and dry.  Neurological:     Mental Status: She is alert. Mental status is at baseline.  Psychiatric:        Mood and Affect: Mood normal.       ASSESSMENT/ PLAN:  TODAY:   1. Right breast cancer: is without change in status; is awaiting a consult for possible palliative mastectomy    2. Age related osteoporosis without current pathological fracture: is stable t score -3.504 off prolia will monitor   3. Major depression current chronic: is stable is off medications will monitor    PREVIOUS   4. Type 2 diabetes uncontrolled with periperal circulatory disorder: hgb a1c 7.1 will continue junamet 50/500 twice daily basaglar 53 units nightly and novolog 17 units with meals.   5. Dyslipidemia associated with type 2 diabetes mellitus: is stable LDL 125 will continue to monitor   6. CKD stage 2 due to type 2 diabetes mellitus: is stable bun 26; creat 1.15  7. Hypertension; associated with stage 2 chronic disease due to type 2 diabetes mellitus: is stable b/p 108/63  will continue lopressor 50 mg twice daily   8. Chronic atrial fibrillation: is stable will continue digoxin 0.125 mcg daily lopressor 50 mg twice daily for rate control is on long term eliquis 5 mg twice daily   9. Bilateral lower extremity edema: is stable will continue lasix 40 mg daily and zaroxolyn 5 mg twice weekly   10. Hypokalemia: is stable k+ 4.2 will continue k+ 40 meq four times daily   11. GERD without esophagitis: is stable will continue prilosec 40 mg daily   12. Unspecified dementia with behavioral disturbance is without significant change in status is slowly losing weight; weight is  181 pounds will continue namenda 10 mg twice daily unfortunately weight loss is an expected outcome in the late stages of this disease process.       MD is aware of resident's narcotic use and is in agreement with current plan of care. We will attempt to wean resident as appropriate.  Ok Edwards NP [facility]  Contact [phone] Monday through Friday 8am- 5pm  After hours call [phone]

## 2019-09-24 NOTE — Progress Notes (Signed)
Patient's daughter called today stating that she has noticed that her mom's right breast has been tender to touch and manipulation.  She states that she has now developed some discoloration darkened and red in areas.  She is concerned about her mom being in a nursing home and developing this massive breast wound from the cancer. She wants to know if there is any way that she can have a palliative mastectomy to remove the cancerous tissue but provide her mom some relief from possible weeping/oozing wounds.  She states that her mom is not doing well overall but with dementia she is concerned about keeping a dressing on.  She states that she knows that there is nothing she can do to save her mom's life but she wants to give her as much quality as she can with the time she has left. She states that if her mom has no other options, she is willing to accept Hospice and enjoy the time she has left with her mom.    I talked with Dr. Delton Coombes and he wants patient to at least be seen by Dr. Arnoldo Morale for consult and we can help with goals of care decisions after that visit.    Patient's daughter verbalizes understanding of the above.

## 2019-09-25 DIAGNOSIS — F4323 Adjustment disorder with mixed anxiety and depressed mood: Secondary | ICD-10-CM | POA: Diagnosis not present

## 2019-09-25 DIAGNOSIS — F331 Major depressive disorder, recurrent, moderate: Secondary | ICD-10-CM | POA: Diagnosis not present

## 2019-10-02 DIAGNOSIS — F331 Major depressive disorder, recurrent, moderate: Secondary | ICD-10-CM | POA: Diagnosis not present

## 2019-10-02 DIAGNOSIS — F4323 Adjustment disorder with mixed anxiety and depressed mood: Secondary | ICD-10-CM | POA: Diagnosis not present

## 2019-10-08 ENCOUNTER — Ambulatory Visit: Payer: Medicare Other | Admitting: General Surgery

## 2019-10-09 DIAGNOSIS — F4323 Adjustment disorder with mixed anxiety and depressed mood: Secondary | ICD-10-CM | POA: Diagnosis not present

## 2019-10-09 DIAGNOSIS — F331 Major depressive disorder, recurrent, moderate: Secondary | ICD-10-CM | POA: Diagnosis not present

## 2019-10-10 ENCOUNTER — Encounter: Payer: Self-pay | Admitting: Adult Health

## 2019-10-10 ENCOUNTER — Other Ambulatory Visit: Payer: Self-pay | Admitting: Adult Health

## 2019-10-10 ENCOUNTER — Non-Acute Institutional Stay (SKILLED_NURSING_FACILITY): Payer: Medicare Other | Admitting: Adult Health

## 2019-10-10 DIAGNOSIS — I340 Nonrheumatic mitral (valve) insufficiency: Secondary | ICD-10-CM | POA: Diagnosis not present

## 2019-10-10 DIAGNOSIS — F0391 Unspecified dementia with behavioral disturbance: Secondary | ICD-10-CM | POA: Diagnosis not present

## 2019-10-10 DIAGNOSIS — Z794 Long term (current) use of insulin: Secondary | ICD-10-CM | POA: Diagnosis not present

## 2019-10-10 DIAGNOSIS — F339 Major depressive disorder, recurrent, unspecified: Secondary | ICD-10-CM | POA: Diagnosis not present

## 2019-10-10 DIAGNOSIS — I361 Nonrheumatic tricuspid (valve) insufficiency: Secondary | ICD-10-CM | POA: Diagnosis not present

## 2019-10-10 DIAGNOSIS — E118 Type 2 diabetes mellitus with unspecified complications: Secondary | ICD-10-CM

## 2019-10-10 MED ORDER — TRAMADOL HCL 50 MG PO TABS: 50.0000 mg | ORAL_TABLET | Freq: Four times a day (QID) | ORAL | 0 refills | Status: DC

## 2019-10-10 NOTE — Progress Notes (Signed)
Location:    [facility] Room Number: 113/W Place of Service:  SNF (31)   CODE STATUS: DNR  Allergies  Allergen Reactions   Codeine Sulfate [Codeine] Anaphylaxis and Hives   Morphine And Related Anaphylaxis and Hives   Penicillins Anaphylaxis   Ace Inhibitors Cough    Chief Complaint  Patient presents with   Acute Visit    Care Plan Meeting    HPI:  We have come together for her care plan meeting. Unable to do BIMS; mood 2/30. There are no reports of falls present. She is awaiting a palliative mastectomy with Dr. Arnoldo Morale. She is awaiting her 2-d ehco. Her weight is stable. She remains total care with her adls and is incontinent of bladder and bowel. There are no reports of agitation; no anxiety no uncontrolled pain. 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   Major depression chronic recurrent  Controlled type 2 diabetes mellitus with complications with long term current use of insulin   Past Medical History:  Diagnosis Date   Allergy    Atrial fibrillation ([place]) 08/2011   First diagnosed in 08/2011; duration of arrhythmia is uncertain   Bilateral lower extremity edema    Chronic diarrhea    diverticulosis   COPD (chronic obstructive pulmonary disease) ([facility])    Dementia ([facility])    Diabetes mellitus, type 2 ([place])    Diabetic neuropathy   Dyspnea on exertion    pedal edema   Gout    Headache(784.0)    twice weekly   Hyperlipidemia    Hypertension    Osteopenia    DEXA scan 01/2010   Palpitations    Seasonal allergies    Stress incontinence    Vertigo     Past Surgical History:  Procedure Laterality Date   APPENDECTOMY     BREAST BIOPSY  2002   Right   CESAREAN SECTION     x 2   CHOLECYSTECTOMY     COLONOSCOPY  2007   Negative screening study   COLONOSCOPY WITH PROPOFOL N/A 09/18/2017   Procedure: COLONOSCOPY WITH PROPOFOL;   Surgeon: Daneil Dolin, MD;  [facility];  Service: Endoscopy;  Laterality: N/A;  11:00am   HAMMER TOE SURGERY     Bilateral hammer toe amputation   INCISIONAL HERNIA REPAIR     KNEE ARTHROSCOPY W/ MENISCAL REPAIR  2007   Bilateral   POLYPECTOMY  09/18/2017   Procedure: POLYPECTOMY;  Surgeon: Daneil Dolin, MD;  [facility];  Service: Endoscopy;;  colon   UMBILICAL HERNIA REPAIR      Social History   Socioeconomic History   Marital status: Widowed    Spouse name: Not on file   Number of children: 2   Years of education: Not on file   Highest education level: Not on file  Occupational History   Occupation: Engineer, materials  Tobacco Use   Smoking status: Never Smoker   Smokeless tobacco: Never Used  Scientific laboratory technician Use: Never used  Substance and Sexual Activity   Alcohol use: No   Drug use: No   Sexual activity: Not Currently  Other Topics Concern   Not on file  Social History Narrative   Not on file   Social Determinants of Health   Financial Resource Strain: Low Risk    Difficulty of Paying Living Expenses: Not hard at all  Food Insecurity: No Food Insecurity  Worried About Charity fundraiser in the Last Year: Never true   [place] in the Last Year: Never true  Transportation Needs: No Transportation Needs   Lack of Transportation (Medical): No   Lack of Transportation (Non-Medical): No  Physical Activity: Inactive   Days of Exercise per Week: 0 days   Minutes of Exercise per Session: 0 min  Stress:    Feeling of Stress :   Social Connections: Socially Isolated   Frequency of Communication with Friends and Family: Never   Frequency of Social Gatherings with Friends and Family: Never   Attends Religious Services: Never   Marine scientist or Organizations: Yes   Attends Archivist Meetings: Never   Marital Status: Widowed  Human resources officer Violence: Not At Risk   Fear of  Current or Ex-Partner: No   Emotionally Abused: No   Physically Abused: No   Sexually Abused: No   Family History  Adopted: Yes      VITAL SIGNS BP 109/75    Pulse 72    Temp 98.1 F (36.7 C) (Oral)    Resp 18    Ht 5\' 6"  (1.676 m)    Wt 178 lb 12.8 oz (81.1 kg)    SpO2 98%    BMI 28.86 kg/m   Outpatient Encounter Medications as of 10/10/2019  Medication Sig   acetaminophen (TYLENOL) 325 MG tablet Take 650 mg by mouth every 6 (six) hours as needed.    apixaban (ELIQUIS) 5 MG TABS tablet Take 5 mg by mouth 2 (two) times daily.    carboxymethylcellulose (REFRESH PLUS) 0.5 % SOLN Place 1 drop into both eyes 4 (four) times daily.   digoxin (LANOXIN) 0.125 MG tablet Take 1 tablet by mouth once a day (HOLD FOR AP UNDER 60)   furosemide (LASIX) 20 MG tablet Take 20 mg by mouth daily.   insulin aspart (NOVOLOG) 100 UNIT/ML injection Inject 17 Units into the skin 3 (three) times daily with meals. If BS above 150   LANTUS SOLOSTAR 100 UNIT/ML Solostar Pen Inject 53 Units into the skin at bedtime.    memantine (NAMENDA) 10 MG tablet Take 10 mg by mouth 2 (two) times daily.   metolazone (ZAROXOLYN) 5 MG tablet Take 5 mg by mouth daily. On Monday and Friday    metoprolol tartrate (LOPRESSOR) 50 MG tablet Take 50 mg by mouth 2 (two) times daily.   NON FORMULARY Diet Type:  NAS, consistent CHO, Dysphagia 3 with thin liquids   NON FORMULARY Give Cranberry Juice twice a day   omeprazole (PRILOSEC) 40 MG capsule Take 40 mg by mouth daily.   polyethylene glycol (MIRALAX / GLYCOLAX) packet Take 17 g by mouth daily as needed. daily prn for constipation - mix with 6 oz of liquid and drink   potassium chloride SA (KLOR-CON) 20 MEQ tablet Take 40 mEq by mouth 4 (four) times daily.    sitaGLIPtin-metformin (JANUMET) 50-500 MG tablet Take 1 tablet by mouth 2 (two) times daily with a meal.   spironolactone (ALDACTONE) 25 MG tablet Take 12.5 mg by mouth daily. 1/2 Tablet = 12.5mg     traMADol (ULTRAM) 50 MG tablet Take 1 tablet (50 mg total) by mouth every 6 (six) hours.   No facility-administered encounter medications on file as of 10/10/2019.     SIGNIFICANT DIAGNOSTIC EXAMS  PREVIOUS  02-21-19: DEXA: t score -3.504  NO NEW EXAMS.    LABS REVIEWED PREVIOUS:   10-09-18: wbc 8.3; hgb  11.9; hct 38.2; mcv 91.6; plt 232; gluocse 203; bun 29; creat 1.06; k+ 3.5;na++ 137; ca 8.2; tsh 2.608; dig 1.0   11-14-18: dig 0.9 12-18-18: hgb a1c 7.8   01-24-19: urine micro-albumin: 5.3 04-23-19: wbc 9.6; hgb 13.0; hct 41.4; mcv 90.6 plt 280; glucose 187; bun 37 creat 1.36; k+ 3.3; na++ 139; ca 9.3 ;liver normal albumin 3.2 05-01-19: k+ 4.3 05-09-19: k+ 3.3; vit B 12: 480 05-13-19: k+ 3.8 05-31-19: glucose 287; bun 27; creat 1.39; k+ 4.4; na++ 138; ca 9.4 07-04-30: wbc 8.9; hgb 10.7; hct 33.9; mcv 92.1 plt 266; glucose 118; bun 33; creat 1.23; k+ 3.2; na++ 137; ca 9.2 chol 129; ldl 82 trig 125; hdl 22 07-11-19: k+ 3.3 07-16-19: glucose 112; bun 26; creat 1.15; k+ 4.2; na++ 139; ca 9.3    NO NEW LABS  Review of Systems  Unable to perform ROS: Dementia (unable to participate )    Physical Exam Constitutional:      General: She is not in acute distress.    Appearance: She is well-developed. She is not diaphoretic.  Neck:     Thyroid: No thyromegaly.  Cardiovascular:     Rate and Rhythm: Normal rate. Rhythm irregular.     Pulses: Normal pulses.     Heart sounds: Normal heart sounds.  Pulmonary:     Effort: Pulmonary effort is normal. No respiratory distress.     Breath sounds: Normal breath sounds.  Chest:     Comments: Right breast mass;large and painful to touch Abdominal:     General: Bowel sounds are normal. There is no distension.     Palpations: Abdomen is soft.     Tenderness: There is no abdominal tenderness.  Musculoskeletal:        General: Normal range of motion.     Cervical back: Neck supple.     Right lower leg: No edema.     Left lower leg: No edema.      Comments: Able to  move all extremities   Lymphadenopathy:     Cervical: No cervical adenopathy.  Skin:    General: Skin is warm and dry.  Neurological:     Mental Status: She is alert. Mental status is at baseline.  Psychiatric:        Mood and Affect: Mood normal.       ASSESSMENT/ PLAN:  TODAY  1. 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 Major depression chronic recurrent 3. Controlled type 2 diabetes mellitus with complications with long term current use of insulin  Will continue current medications Will continue current plan of care Will await her palliative mastectomy Will continue to monitor her status.     MD is aware of resident's narcotic use and is in agreement with current plan of care. We will attempt to wean resident as appropriate.  Ok Edwards NP [facility]  Contact [phone] Monday through Friday 8am- 5pm  After hours call [phone]

## 2019-10-16 ENCOUNTER — Non-Acute Institutional Stay: Payer: Self-pay | Admitting: Adult Health

## 2019-10-16 ENCOUNTER — Encounter: Payer: Self-pay | Admitting: Adult Health

## 2019-10-16 DIAGNOSIS — C50911 Malignant neoplasm of unspecified site of right female breast: Secondary | ICD-10-CM

## 2019-10-16 DIAGNOSIS — F331 Major depressive disorder, recurrent, moderate: Secondary | ICD-10-CM | POA: Diagnosis not present

## 2019-10-16 DIAGNOSIS — F4323 Adjustment disorder with mixed anxiety and depressed mood: Secondary | ICD-10-CM | POA: Diagnosis not present

## 2019-10-16 NOTE — Progress Notes (Signed)
Location:    [facility] Room Number: 113/w Place of Service:  SNF (31)   CODE STATUS: DNR  Allergies  Allergen Reactions   Codeine Sulfate [Codeine] Anaphylaxis and Hives   Morphine And Related Anaphylaxis and Hives   Penicillins Anaphylaxis   Ace Inhibitors Cough    Chief Complaint  Patient presents with   Acute Visit     Medication Review    HPI:  She is presently taking ultram 50 mg every 6 hours for her breast cancer. Her pain is fairly well controlled with her current regimen. Her pain is worse with touch. She is due for a palliative breast mastectomy. At this time lowering her medication could cause her both emotional and physical harm.    Past Medical History:  Diagnosis Date   Allergy    Atrial fibrillation ([place]) 08/2011   First diagnosed in 08/2011; duration of arrhythmia is uncertain   Bilateral lower extremity edema    Chronic diarrhea    diverticulosis   COPD (chronic obstructive pulmonary disease) (HCC)    Dementia (HCC)    Diabetes mellitus, type 2 ([place])    Diabetic neuropathy   Dyspnea on exertion    pedal edema   Gout    Headache(784.0)    twice weekly   Hyperlipidemia    Hypertension    Osteopenia    DEXA scan 01/2010   Palpitations    Seasonal allergies    Stress incontinence    Vertigo     Past Surgical History:  Procedure Laterality Date   APPENDECTOMY     BREAST BIOPSY  2002   Right   CESAREAN SECTION     x 2   CHOLECYSTECTOMY     COLONOSCOPY  2007   Negative screening study   COLONOSCOPY WITH PROPOFOL N/A 09/18/2017   Procedure: COLONOSCOPY WITH PROPOFOL;  Surgeon: Daneil Dolin, MD;  [facility];  Service: Endoscopy;  Laterality: N/A;  11:00am   HAMMER TOE SURGERY     Bilateral hammer toe amputation   INCISIONAL HERNIA REPAIR     KNEE ARTHROSCOPY W/ MENISCAL REPAIR  2007   Bilateral   POLYPECTOMY  09/18/2017   Procedure: POLYPECTOMY;  Surgeon: Daneil Dolin, MD;  [facility];  Service: Endoscopy;;  colon   UMBILICAL HERNIA REPAIR      Social History   Socioeconomic History   Marital status: Widowed    Spouse name: Not on file   Number of children: 2   Years of education: Not on file   Highest education level: Not on file  Occupational History   Occupation: Engineer, materials  Tobacco Use   Smoking status: Never Smoker   Smokeless tobacco: Never Used  Scientific laboratory technician Use: Never used  Substance and Sexual Activity   Alcohol use: No   Drug use: No   Sexual activity: Not Currently  Other Topics Concern   Not on file  Social History Narrative   Not on file   Social Determinants of Health   Financial Resource Strain: Low Risk    Difficulty of Paying Living Expenses: Not hard at all  Food Insecurity: No Food Insecurity   Worried About Charity fundraiser in the Last Year: Never true   [place] in the Last Year: Never true  Transportation Needs: No Transportation Needs   Lack of Transportation (Medical): No   Lack of Transportation (Non-Medical): No  Physical Activity: Inactive  Days of Exercise per Week: 0 days   Minutes of Exercise per Session: 0 min  Stress:    Feeling of Stress :   Social Connections: Socially Isolated   Frequency of Communication with Friends and Family: Never   Frequency of Social Gatherings with Friends and Family: Never   Attends Religious Services: Never   Marine scientist or Organizations: Yes   Attends Archivist Meetings: Never   Marital Status: Widowed  Human resources officer Violence: Not At Risk   Fear of Current or Ex-Partner: No   Emotionally Abused: No   Physically Abused: No   Sexually Abused: No   Family History  Adopted: Yes      VITAL SIGNS BP 113/79    Pulse 72    Temp 98.4 F (36.9 C) (Oral)    Resp 17    Ht 5\' 6"  (1.676 m)    Wt 178 lb 12.8 oz (81.1 kg)    BMI 28.86 kg/m   Outpatient Encounter  Medications as of 10/16/2019  Medication Sig   acetaminophen (TYLENOL) 325 MG tablet Take 650 mg by mouth every 6 (six) hours as needed.    apixaban (ELIQUIS) 5 MG TABS tablet Take 5 mg by mouth 2 (two) times daily.    carboxymethylcellulose (REFRESH PLUS) 0.5 % SOLN Place 1 drop into both eyes 4 (four) times daily.   digoxin (LANOXIN) 0.125 MG tablet Take 1 tablet by mouth once a day (HOLD FOR AP UNDER 60)   furosemide (LASIX) 20 MG tablet Take 20 mg by mouth daily.   insulin aspart (NOVOLOG) 100 UNIT/ML injection Inject 17 Units into the skin 3 (three) times daily with meals. If BS above 150   LANTUS SOLOSTAR 100 UNIT/ML Solostar Pen Inject 53 Units into the skin at bedtime.    memantine (NAMENDA) 10 MG tablet Take 10 mg by mouth 2 (two) times daily.   metolazone (ZAROXOLYN) 5 MG tablet Take 5 mg by mouth daily. On Monday and Friday    metoprolol tartrate (LOPRESSOR) 50 MG tablet Take 50 mg by mouth 2 (two) times daily.   NON FORMULARY Diet Type:  NAS, consistent CHO, Dysphagia 3 with thin liquids   NON FORMULARY Give Cranberry Juice twice a day   omeprazole (PRILOSEC) 40 MG capsule Take 40 mg by mouth daily.   polyethylene glycol (MIRALAX / GLYCOLAX) packet Take 17 g by mouth daily as needed. daily prn for constipation - mix with 6 oz of liquid and drink   potassium chloride SA (KLOR-CON) 20 MEQ tablet Take 40 mEq by mouth 4 (four) times daily.    sitaGLIPtin-metformin (JANUMET) 50-500 MG tablet Take 1 tablet by mouth 2 (two) times daily with a meal.   spironolactone (ALDACTONE) 25 MG tablet Take 12.5 mg by mouth daily. 1/2 Tablet = 12.5mg    traMADol (ULTRAM) 50 MG tablet Take 1 tablet (50 mg total) by mouth every 6 (six) hours.   No facility-administered encounter medications on file as of 10/16/2019.     SIGNIFICANT DIAGNOSTIC EXAMS   PREVIOUS  02-21-19: DEXA: t score -3.504  NO NEW EXAMS.    LABS REVIEWED PREVIOUS:   11-14-18: dig 0.9 12-18-18: hgb a1c 7.8     01-24-19: urine micro-albumin: 5.3 04-23-19: wbc 9.6; hgb 13.0; hct 41.4; mcv 90.6 plt 280; glucose 187; bun 37 creat 1.36; k+ 3.3; na++ 139; ca 9.3 ;liver normal albumin 3.2 05-01-19: k+ 4.3 05-09-19: k+ 3.3; vit B 12: 480 05-13-19: k+ 3.8 05-31-19: glucose 287; bun  27; creat 1.39; k+ 4.4; na++ 138; ca 9.4 07-04-30: wbc 8.9; hgb 10.7; hct 33.9; mcv 92.1 plt 266; glucose 118; bun 33; creat 1.23; k+ 3.2; na++ 137; ca 9.2 chol 129; ldl 82 trig 125; hdl 22 07-11-19: k+ 3.3 07-16-19: glucose 112; bun 26; creat 1.15; k+ 4.2; na++ 139; ca 9.3    NO NEW LABS  Review of Systems  Unable to perform ROS: Dementia (unable to participate )    Physical Exam Constitutional:      General: She is not in acute distress.    Appearance: She is well-developed. She is not diaphoretic.  Neck:     Thyroid: No thyromegaly.  Cardiovascular:     Rate and Rhythm: Normal rate and regular rhythm.     Pulses: Normal pulses.     Heart sounds: Normal heart sounds.  Pulmonary:     Effort: Pulmonary effort is normal. No respiratory distress.     Breath sounds: Normal breath sounds.  Chest:     Comments: Right breast mass;large and painful to touch Abdominal:     General: Bowel sounds are normal. There is no distension.     Palpations: Abdomen is soft.     Tenderness: There is no abdominal tenderness.  Musculoskeletal:     Cervical back: Neck supple.     Right lower leg: No edema.     Left lower leg: No edema.     Comments: Is able to move all extremities   Lymphadenopathy:     Cervical: No cervical adenopathy.  Skin:    General: Skin is warm and dry.  Neurological:     Mental Status: She is alert. Mental status is at baseline.  Psychiatric:        Mood and Affect: Mood normal.      ASSESSMENT/ PLAN:  TODAY  1. 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 We will attempt to wean resident as appropriate.  Ok Edwards NP [facility]  Contact [phone] Monday through Friday 8am- 5pm  After hours call [phone]

## 2019-10-18 ENCOUNTER — Encounter: Payer: Self-pay | Admitting: General Surgery

## 2019-10-18 ENCOUNTER — Telehealth (INDEPENDENT_AMBULATORY_CARE_PROVIDER_SITE_OTHER): Payer: Medicare Other | Admitting: General Surgery

## 2019-10-18 DIAGNOSIS — N6310 Unspecified lump in the right breast, unspecified quadrant: Secondary | ICD-10-CM | POA: Diagnosis not present

## 2019-10-18 NOTE — Progress Notes (Signed)
Christine [name]; [id number]; [birth year]   HPI Patient is a 79 year old white female who was referred to my care by Dr. Delton Coombes of oncology for evaluation for possible palliative right mastectomy.  Patient was found to have a large centrally located mass in the right breast.  Patient was seen by oncology but it was felt that given her overall comorbidities including dementia, further work-up was deferred.  I was asked to see the patient for possible palliative right simple mastectomy.  She has multiple medical problems including atrial fibrillation, chronic anticoagulation, dementia, COPD, diabetes mellitus.  In talking with the daughter, she is strongly considering having hospice consult in the patient as she has been told that the patient only has approximately 6 months to live.  I could not obtain a history from the patient due to her dementia.  I did review her chart. Past Medical History:  Diagnosis Date  . Allergy   . Atrial fibrillation ([place]) 08/2011   First diagnosed in 08/2011; duration of arrhythmia is uncertain  . Bilateral lower extremity edema   . Chronic diarrhea    diverticulosis  . COPD (chronic obstructive pulmonary disease) ([place])   . Dementia ([place])   . Diabetes mellitus, type 2 (HCC)    Diabetic neuropathy  . Dyspnea on exertion    pedal edema  . Gout   . Headache(784.0)    twice weekly  . Hyperlipidemia   . Hypertension   . Osteopenia    DEXA scan 01/2010  . Palpitations   . Seasonal allergies   . Stress incontinence   . Vertigo     Past Surgical History:  Procedure Laterality Date  . APPENDECTOMY    . BREAST BIOPSY  2002   Right  . CESAREAN SECTION     x 2  . CHOLECYSTECTOMY    . COLONOSCOPY  2007   Negative screening study  . COLONOSCOPY WITH PROPOFOL N/A 09/18/2017   Procedure: COLONOSCOPY WITH PROPOFOL;  Surgeon: Daneil Dolin, MD;  [facility];  Service: Endoscopy;  Laterality: N/A;  11:00am  . HAMMER TOE SURGERY     Bilateral hammer  toe amputation  . INCISIONAL HERNIA REPAIR    . KNEE ARTHROSCOPY W/ MENISCAL REPAIR  2007   Bilateral  . POLYPECTOMY  09/18/2017   Procedure: POLYPECTOMY;  Surgeon: Daneil Dolin, MD;  [facility];  Service: Endoscopy;;  colon  . UMBILICAL HERNIA REPAIR      Family History  Adopted: Yes    Current Outpatient Medications on File Prior to Visit  Medication Sig Dispense Refill  . acetaminophen (TYLENOL) 325 MG tablet Take 650 mg by mouth every 6 (six) hours as needed.     Marland Kitchen apixaban (ELIQUIS) 5 MG TABS tablet Take 5 mg by mouth 2 (two) times daily.     . carboxymethylcellulose (REFRESH PLUS) 0.5 % SOLN Place 1 drop into both eyes 4 (four) times daily.    . digoxin (LANOXIN) 0.125 MG tablet Take 1 tablet by mouth once a day (HOLD FOR AP UNDER 60)    . furosemide (LASIX) 20 MG tablet Take 20 mg by mouth daily.    . insulin aspart (NOVOLOG) 100 UNIT/ML injection Inject 17 Units into the skin 3 (three) times daily with meals. If BS above 150    . LANTUS SOLOSTAR 100 UNIT/ML Solostar Pen Inject 53 Units into the skin at bedtime.     . memantine (NAMENDA) 10 MG tablet Take 10 mg by mouth 2 (two)  times daily.    . metolazone (ZAROXOLYN) 5 MG tablet Take 5 mg by mouth daily. On Monday and Friday     . metoprolol tartrate (LOPRESSOR) 50 MG tablet Take 50 mg by mouth 2 (two) times daily.    . NON FORMULARY Diet Type:  NAS, consistent CHO, Dysphagia 3 with thin liquids    . NON FORMULARY Give Cranberry Juice twice a day    . omeprazole (PRILOSEC) 40 MG capsule Take 40 mg by mouth daily.    . polyethylene glycol (MIRALAX / GLYCOLAX) packet Take 17 g by mouth daily as needed. daily prn for constipation - mix with 6 oz of liquid and drink    . potassium chloride SA (KLOR-CON) 20 MEQ tablet Take 40 mEq by mouth 4 (four) times daily.     . sitaGLIPtin-metformin (JANUMET) 50-500 MG tablet Take 1 tablet by mouth 2 (two) times daily with a meal.    . spironolactone (ALDACTONE) 25 MG tablet Take  12.5 mg by mouth daily. 1/2 Tablet = 12.5mg     . traMADol (ULTRAM) 50 MG tablet Take 1 tablet (50 mg total) by mouth every 6 (six) hours. 120 tablet 0   No current facility-administered medications on file prior to visit.    Allergies  Allergen Reactions  . Codeine Sulfate [Codeine] Anaphylaxis and Hives  . Morphine And Related Anaphylaxis and Hives  . Penicillins Anaphylaxis  . Ace Inhibitors Cough    Social History   Substance and Sexual Activity  Alcohol Use No    Social History   Tobacco Use  Smoking Status Never Smoker  Smokeless Tobacco Never Used    Review of Systems  Unable to perform ROS: Dementia    Objective   There were no vitals filed for this visit.  Physical Exam Vitals and nursing note reviewed. Exam conducted with a chaperone present.  Constitutional:      Appearance: She is not ill-appearing.  HENT:     Head: Normocephalic and atraumatic.  Cardiovascular:     Rate and Rhythm: Rhythm irregular.     Heart sounds: No murmur heard.  No friction rub. No gallop.   Pulmonary:     Effort: Pulmonary effort is normal. No respiratory distress.     Breath sounds: Normal breath sounds. No stridor. No wheezing, rhonchi or rales.  Lymphadenopathy:     Cervical: No cervical adenopathy.  Neurological:     Mental Status: She is alert. She is disoriented.   Breast: Large centrally located dominant mass.  Pendulous breasts noted.  No nipple discharge or dimpling noted.  The axilla is negative for palpable nodes.  2D echo reveals a 55% ejection fraction with minimal mitral regurgitation.  Assessment  Large right breast mass, most probably carcinoma.  Multiple medical problems including dementia, atrial fibrillation, chronic anticoagulation, diabetes mellitus, COPD, deconditioning. Plan   I told the daughter that even though she is at higher risk for what general anesthesia, I would be willing to perform a right simple mastectomy.  She fully realizes this is  just for control of the tumor so that it does not erode through the skin.  She is very interested in actually pursuing hospice care.  I told her that she can think about this decision.  She told me that she will be reaching out to oncology to discuss the matter further.

## 2019-10-22 ENCOUNTER — Encounter: Payer: Self-pay | Admitting: Adult Health

## 2019-10-22 ENCOUNTER — Non-Acute Institutional Stay (SKILLED_NURSING_FACILITY): Payer: Medicare Other | Admitting: Adult Health

## 2019-10-22 DIAGNOSIS — Z794 Long term (current) use of insulin: Secondary | ICD-10-CM

## 2019-10-22 DIAGNOSIS — N182 Chronic kidney disease, stage 2 (mild): Secondary | ICD-10-CM

## 2019-10-22 DIAGNOSIS — E118 Type 2 diabetes mellitus with unspecified complications: Secondary | ICD-10-CM | POA: Diagnosis not present

## 2019-10-22 DIAGNOSIS — E785 Hyperlipidemia, unspecified: Secondary | ICD-10-CM

## 2019-10-22 DIAGNOSIS — E1122 Type 2 diabetes mellitus with diabetic chronic kidney disease: Secondary | ICD-10-CM | POA: Diagnosis not present

## 2019-10-22 DIAGNOSIS — E1169 Type 2 diabetes mellitus with other specified complication: Secondary | ICD-10-CM | POA: Diagnosis not present

## 2019-10-22 NOTE — Progress Notes (Signed)
Location:    [facility] Room Number: 113/W Place of Service:  SNF (31)   CODE STATUS: DNR  Allergies  Allergen Reactions  . Codeine Sulfate [Codeine] Anaphylaxis and Hives  . Morphine And Related Anaphylaxis and Hives  . Penicillins Anaphylaxis  . Ace Inhibitors Cough    Chief Complaint  Patient presents with  . Medical Management of Chronic Issues         Type 2 diabetes mellitus uncontrolled with peripheral circulatory disorder:   Dyslipidemia associated with type 2 diabetes mellitus:  CKD stage 2 due to type 2 diabetes mellitus    HPI:  She is a 79 year old long term resident of this facility being seen for the management of her chronic illnesses: diabetes; dyslipidemia; ckd. There are no reports of uncontrolled pain; no reports of constipation; no reports of agitation present.   Past Medical History:  Diagnosis Date  . Allergy   . Atrial fibrillation ([place]) 08/2011   First diagnosed in 08/2011; duration of arrhythmia is uncertain  . Bilateral lower extremity edema   . Chronic diarrhea    diverticulosis  . COPD (chronic obstructive pulmonary disease) ([place])   . Dementia ([place])   . Diabetes mellitus, type 2 ([facility])    Diabetic neuropathy  . Dyspnea on exertion    pedal edema  . Gout   . Headache(784.0)    twice weekly  . Hyperlipidemia   . Hypertension   . Osteopenia    DEXA scan 01/2010  . Palpitations   . Seasonal allergies   . Stress incontinence   . Vertigo     Past Surgical History:  Procedure Laterality Date  . APPENDECTOMY    . BREAST BIOPSY  2002   Right  . CESAREAN SECTION     x 2  . CHOLECYSTECTOMY    . COLONOSCOPY  2007   Negative screening study  . COLONOSCOPY WITH PROPOFOL N/A 09/18/2017   Procedure: COLONOSCOPY WITH PROPOFOL;  Surgeon: Daneil Dolin, MD;  [facility];  Service: Endoscopy;  Laterality: N/A;  11:00am  . HAMMER TOE SURGERY     Bilateral hammer toe amputation  . INCISIONAL HERNIA REPAIR    .  KNEE ARTHROSCOPY W/ MENISCAL REPAIR  2007   Bilateral  . POLYPECTOMY  09/18/2017   Procedure: POLYPECTOMY;  Surgeon: Daneil Dolin, MD;  [facility];  Service: Endoscopy;;  colon  . UMBILICAL HERNIA REPAIR      Social History   Socioeconomic History  . Marital status: Widowed    Spouse name: Not on file  . Number of children: 2  . Years of education: Not on file  . Highest education level: Not on file  Occupational History  . Occupation: Engineer, materials  Tobacco Use  . Smoking status: Never Smoker  . Smokeless tobacco: Never Used  Vaping Use  . Vaping Use: Never used  Substance and Sexual Activity  . Alcohol use: No  . Drug use: No  . Sexual activity: Not Currently  Other Topics Concern  . Not on file  Social History Narrative  . Not on file   Social Determinants of Health   Financial Resource Strain: Low Risk   . Difficulty of Paying Living Expenses: Not hard at all  Food Insecurity: No Food Insecurity  . Worried About Charity fundraiser in the Last Year: Never true  . Ran Out of Food in the Last Year: Never true  Transportation Needs: No Transportation  Needs  . Lack of Transportation (Medical): No  . Lack of Transportation (Non-Medical): No  Physical Activity: Inactive  . Days of Exercise per Week: 0 days  . Minutes of Exercise per Session: 0 min  Stress:   . Feeling of Stress :   Social Connections: Socially Isolated  . Frequency of Communication with Friends and Family: Never  . Frequency of Social Gatherings with Friends and Family: Never  . Attends Religious Services: Never  . Active Member of Clubs or Organizations: Yes  . Attends Archivist Meetings: Never  . Marital Status: Widowed  Intimate Partner Violence: Not At Risk  . Fear of Current or Ex-Partner: No  . Emotionally Abused: No  . Physically Abused: No  . Sexually Abused: No   Family History  Adopted: Yes      VITAL SIGNS BP 120/78   Pulse 79   Temp 97.7 F  (36.5 C) (Oral)   Resp 18   Ht 5\' 6"  (1.676 m)   Wt 178 lb 12.8 oz (81.1 kg)   BMI 28.86 kg/m   Outpatient Encounter Medications as of 10/22/2019  Medication Sig  . acetaminophen (TYLENOL) 325 MG tablet Take 650 mg by mouth every 6 (six) hours as needed.   Marland Kitchen apixaban (ELIQUIS) 5 MG TABS tablet Take 5 mg by mouth 2 (two) times daily.   . carboxymethylcellulose (REFRESH PLUS) 0.5 % SOLN Place 1 drop into both eyes 4 (four) times daily.  . digoxin (LANOXIN) 0.125 MG tablet Take 1 tablet by mouth once a day (HOLD FOR AP UNDER 60)  . furosemide (LASIX) 20 MG tablet Take 20 mg by mouth daily.  . insulin aspart (NOVOLOG) 100 UNIT/ML injection Inject 17 Units into the skin 3 (three) times daily with meals. If BS above 150  . LANTUS SOLOSTAR 100 UNIT/ML Solostar Pen Inject 53 Units into the skin at bedtime.   . memantine (NAMENDA) 10 MG tablet Take 10 mg by mouth 2 (two) times daily.  . metolazone (ZAROXOLYN) 5 MG tablet Take 5 mg by mouth daily. On Monday and Friday   . metoprolol tartrate (LOPRESSOR) 50 MG tablet Take 50 mg by mouth 2 (two) times daily.  . NON FORMULARY Diet Type:  NAS, consistent CHO, Dysphagia 3 with thin liquids  . NON FORMULARY Give Cranberry Juice twice a day  . omeprazole (PRILOSEC) 40 MG capsule Take 40 mg by mouth daily.  . polyethylene glycol (MIRALAX / GLYCOLAX) packet Take 17 g by mouth daily as needed. daily prn for constipation - mix with 6 oz of liquid and drink  . potassium chloride SA (KLOR-CON) 20 MEQ tablet Take 40 mEq by mouth 4 (four) times daily.   . sitaGLIPtin-metformin (JANUMET) 50-500 MG tablet Take 1 tablet by mouth 2 (two) times daily with a meal.  . spironolactone (ALDACTONE) 25 MG tablet Take 12.5 mg by mouth daily. 1/2 Tablet = 12.5mg   . traMADol (ULTRAM) 50 MG tablet Take 1 tablet (50 mg total) by mouth every 6 (six) hours.   No facility-administered encounter medications on file as of 10/22/2019.     SIGNIFICANT DIAGNOSTIC  EXAMS   PREVIOUS  02-21-19: DEXA: t score -3.504  NO NEW EXAMS.    LABS REVIEWED PREVIOUS:   10-09-18: wbc 8.3; hgb 11.9; hct 38.2; mcv 91.6; plt 232; gluocse 203; bun 29; creat 1.06; k+ 3.5;na++ 137; ca 8.2; tsh 2.608; dig 1.0   11-14-18: dig 0.9 12-18-18: hgb a1c 7.8   01-24-19: urine micro-albumin: 5.3 04-23-19: wbc  9.6; hgb 13.0; hct 41.4; mcv 90.6 plt 280; glucose 187; bun 37 creat 1.36; k+ 3.3; na++ 139; ca 9.3 ;liver normal albumin 3.2 05-01-19: k+ 4.3 05-09-19: k+ 3.3; vit B 12: 480 05-13-19: k+ 3.8 05-31-19: glucose 287; bun 27; creat 1.39; k+ 4.4; na++ 138; ca 9.4 07-04-30: wbc 8.9; hgb 10.7; hct 33.9; mcv 92.1 plt 266; glucose 118; bun 33; creat 1.23; k+ 3.2; na++ 137; ca 9.2 chol 129; ldl 82 trig 125; hdl 22 07-11-19: k+ 3.3 07-16-19: glucose 112; bun 26; creat 1.15; k+ 4.2; na++ 139; ca 9.3    NO NEW LABS   Review of Systems  Unable to perform ROS: Dementia (unable to participate )    Physical Exam Constitutional:      General: She is not in acute distress.    Appearance: She is well-developed. She is not diaphoretic.  Neck:     Thyroid: No thyromegaly.  Cardiovascular:     Rate and Rhythm: Normal rate and regular rhythm.     Pulses: Normal pulses.     Heart sounds: Normal heart sounds.  Pulmonary:     Effort: Pulmonary effort is normal. No respiratory distress.     Breath sounds: Normal breath sounds.  Chest:     Comments: Right breast mass;large and painful to touch Abdominal:     General: Bowel sounds are normal. There is no distension.     Palpations: Abdomen is soft.     Tenderness: There is no abdominal tenderness.  Musculoskeletal:     Cervical back: Neck supple.     Right lower leg: No edema.     Left lower leg: No edema.     Comments: Is able to move all extremities   Lymphadenopathy:     Cervical: No cervical adenopathy.  Skin:    General: Skin is warm and dry.  Neurological:     Mental Status: She is alert. Mental status is at baseline.   Psychiatric:        Mood and Affect: Mood normal.       ASSESSMENT/ PLAN:  TODAY:   1. Type 2 diabetes mellitus uncontrolled with peripheral circulatory disorder: hgb a1c 7.1 will continue junamet 50/500 mg twice daily basaglar 53 units and novolog 17 units meals.   2. Dyslipidemia associated with type 2 diabetes mellitus: is stable LDL 125 will monitor   3. CKD stage 2 due to type 2 diabetes mellitus is stable bun 26; creat 1.15    PREVIOUS   4. Hypertension; associated with stage 2 chronic disease due to type 2 diabetes mellitus: is stable b/p 108/63  will continue lopressor 50 mg twice daily   5. Chronic atrial fibrillation: is stable will continue digoxin 0.125 mcg daily lopressor 50 mg twice daily for rate control is on long term eliquis 5 mg twice daily   6. Bilateral lower extremity edema: is stable will continue lasix 40 mg daily and zaroxolyn 5 mg twice weekly   7. Hypokalemia: is stable k+ 4.2 will continue k+ 40 meq four times daily   8. GERD without esophagitis: is stable will continue prilosec 40 mg daily   9. Unspecified dementia with behavioral disturbance is without significant change in status is slowly losing weight; weight is 181 pounds will continue namenda 10 mg twice daily unfortunately weight loss is an expected outcome in the late stages of this disease process.   10. Right breast cancer: is without change in status; is awaiting palliative mastectomy    11. Age  related osteoporosis without current pathological fracture: is stable t score -3.504 off prolia will monitor   12. Major depression current chronic: is stable is off medications will monitor          MD is aware of resident's narcotic use and is in agreement with current plan of care. We will attempt to wean resident as appropriate.  Ok Edwards NP [facility]  Contact [phone] Monday through Friday 8am- 5pm  After hours call [phone]

## 2019-11-05 ENCOUNTER — Other Ambulatory Visit: Payer: Self-pay | Admitting: Adult Health

## 2019-11-05 MED ORDER — TRAMADOL HCL 50 MG PO TABS: 50.0000 mg | ORAL_TABLET | Freq: Four times a day (QID) | ORAL | 0 refills | Status: DC

## 2019-11-06 ENCOUNTER — Other Ambulatory Visit: Payer: Self-pay | Admitting: Adult Health

## 2019-11-06 DIAGNOSIS — F4323 Adjustment disorder with mixed anxiety and depressed mood: Secondary | ICD-10-CM | POA: Diagnosis not present

## 2019-11-06 MED ORDER — TRAMADOL HCL 50 MG PO TABS: 50.0000 mg | ORAL_TABLET | Freq: Four times a day (QID) | ORAL | 0 refills | Status: DC

## 2019-11-12 DIAGNOSIS — G301 Alzheimer's disease with late onset: Secondary | ICD-10-CM | POA: Diagnosis not present

## 2019-11-12 DIAGNOSIS — F331 Major depressive disorder, recurrent, moderate: Secondary | ICD-10-CM | POA: Diagnosis not present

## 2019-11-13 DIAGNOSIS — F4323 Adjustment disorder with mixed anxiety and depressed mood: Secondary | ICD-10-CM | POA: Diagnosis not present

## 2019-11-14 ENCOUNTER — Non-Acute Institutional Stay (SKILLED_NURSING_FACILITY): Payer: Medicare Other | Admitting: Adult Health

## 2019-11-14 ENCOUNTER — Encounter: Payer: Self-pay | Admitting: Adult Health

## 2019-11-14 DIAGNOSIS — I129 Hypertensive chronic kidney disease with stage 1 through stage 4 chronic kidney disease, or unspecified chronic kidney disease: Secondary | ICD-10-CM | POA: Diagnosis not present

## 2019-11-14 DIAGNOSIS — R600 Localized edema: Secondary | ICD-10-CM | POA: Diagnosis not present

## 2019-11-14 DIAGNOSIS — E1122 Type 2 diabetes mellitus with diabetic chronic kidney disease: Secondary | ICD-10-CM | POA: Diagnosis not present

## 2019-11-14 DIAGNOSIS — I482 Chronic atrial fibrillation, unspecified: Secondary | ICD-10-CM | POA: Diagnosis not present

## 2019-11-14 DIAGNOSIS — N182 Chronic kidney disease, stage 2 (mild): Secondary | ICD-10-CM | POA: Diagnosis not present

## 2019-11-14 NOTE — Progress Notes (Signed)
Location:    [facility] Room Number: 113/W Place of Service:  SNF (31)   CODE STATUS: DNR  Allergies  Allergen Reactions  . Codeine Sulfate [Codeine] Anaphylaxis and Hives  . Morphine And Related Anaphylaxis and Hives  . Penicillins Anaphylaxis  . Ace Inhibitors Cough    Chief Complaint  Patient presents with  . 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 Bilateral lower extremity edema. There are no reports of uncontrolled pain; no reports of agitation or anxiety. Her weight is stable at 180 pounds. Her right mastectomy is pending at this time.   Past Medical History:  Diagnosis Date  . Allergy   . Atrial fibrillation ([place]) 08/2011   First diagnosed in 08/2011; duration of arrhythmia is uncertain  . Bilateral lower extremity edema   . Chronic diarrhea    diverticulosis  . COPD (chronic obstructive pulmonary disease) ([place])   . Dementia ([facility])   . Diabetes mellitus, type 2 (HCC)    Diabetic neuropathy  . Dyspnea on exertion    pedal edema  . Gout   . Headache(784.0)    twice weekly  . Hyperlipidemia   . Hypertension   . Osteopenia    DEXA scan 01/2010  . Palpitations   . Seasonal allergies   . Stress incontinence   . Vertigo     Past Surgical History:  Procedure Laterality Date  . APPENDECTOMY    . BREAST BIOPSY  2002   Right  . CESAREAN SECTION     x 2  . CHOLECYSTECTOMY    . COLONOSCOPY  2007   Negative screening study  . COLONOSCOPY WITH PROPOFOL N/A 09/18/2017   Procedure: COLONOSCOPY WITH PROPOFOL;  Surgeon: Daneil Dolin, MD;  [facility];  Service: Endoscopy;  Laterality: N/A;  11:00am  . HAMMER TOE SURGERY     Bilateral hammer toe amputation  . INCISIONAL HERNIA REPAIR    . KNEE ARTHROSCOPY W/ MENISCAL REPAIR  2007   Bilateral  . POLYPECTOMY  09/18/2017   Procedure: POLYPECTOMY;  Surgeon: Daneil Dolin, MD;  [facility];  Service: Endoscopy;;  colon  . UMBILICAL HERNIA REPAIR      Social History   Socioeconomic History  . Marital status: Widowed    Spouse name: Not on file  . Number of children: 2  . Years of education: Not on file  . Highest education level: Not on file  Occupational History  . Occupation: Engineer, materials  Tobacco Use  . Smoking status: Never Smoker  . Smokeless tobacco: Never Used  Vaping Use  . Vaping Use: Never used  Substance and Sexual Activity  . Alcohol use: No  . Drug use: No  . Sexual activity: Not Currently  Other Topics Concern  . Not on file  Social History Narrative  . Not on file   Social Determinants of Health   Financial Resource Strain: Low Risk   . Difficulty of Paying Living Expenses: Not hard at all  Food Insecurity: No Food Insecurity  . Worried About Charity fundraiser in the Last Year:  Never true  . Ran Out of Food in the Last Year: Never true  Transportation Needs: No Transportation Needs  . Lack of Transportation (Medical): No  . Lack of Transportation (Non-Medical): No  Physical Activity: Inactive  . Days of Exercise per Week: 0 days  . Minutes of Exercise per Session: 0 min  Stress:   . Feeling of Stress :   Social Connections: Socially Isolated  . Frequency of Communication with Friends and Family: Never  . Frequency of Social Gatherings with Friends and Family: Never  . Attends Religious Services: Never  . Active Member of Clubs or Organizations: Yes  . Attends Archivist Meetings: Never  . Marital Status: Widowed  Intimate Partner Violence: Not At Risk  . Fear of Current or Ex-Partner: No  . Emotionally  Abused: No  . Physically Abused: No  . Sexually Abused: No   Family History  Adopted: Yes      VITAL SIGNS BP 106/73   Pulse 65   Temp 98.2 F (36.8 C) (Oral)   Resp 18   Ht 5\' 6"  (1.676 m)   Wt 180 lb (81.6 kg)   BMI 29.05 kg/m   Outpatient Encounter Medications as of 11/14/2019  Medication Sig  . acetaminophen (TYLENOL) 325 MG tablet Take 650 mg by mouth every 6 (six) hours as needed.   Marland Kitchen apixaban (ELIQUIS) 5 MG TABS tablet Take 5 mg by mouth 2 (two) times daily.   . carboxymethylcellulose (REFRESH PLUS) 0.5 % SOLN Place 1 drop into both eyes 4 (four) times daily.  . digoxin (LANOXIN) 0.125 MG tablet Take 1 tablet by mouth once a day (HOLD FOR AP UNDER 60)  . furosemide (LASIX) 20 MG tablet Take 20 mg by mouth daily.  . insulin aspart (NOVOLOG) 100 UNIT/ML injection Inject 17 Units into the skin 3 (three) times daily with meals. If BS above 150  . LANTUS SOLOSTAR 100 UNIT/ML Solostar Pen Inject 53 Units into the skin at bedtime.   . memantine (NAMENDA) 10 MG tablet Take 10 mg by mouth 2 (two) times daily.  . metolazone (ZAROXOLYN) 5 MG tablet Take 5 mg by mouth daily. On Monday and Friday   . metoprolol tartrate (LOPRESSOR) 50 MG tablet Take 50 mg by mouth 2 (two) times daily.  . NON FORMULARY Diet Type:  NAS, consistent CHO, Dysphagia 3 with thin liquids  . NON FORMULARY Give Cranberry Juice twice a day  . omeprazole (PRILOSEC) 40 MG capsule Take 40 mg by mouth daily.  . polyethylene glycol (MIRALAX / GLYCOLAX) packet Take 17 g by mouth daily as needed. daily prn for constipation - mix with 6 oz of liquid and drink  . potassium chloride SA (KLOR-CON) 20 MEQ tablet Take 40 mEq by mouth 4 (four) times daily.   . sitaGLIPtin-metformin (JANUMET) 50-500 MG tablet Take 1 tablet by mouth 2 (two) times daily with a meal.  . spironolactone (ALDACTONE) 25 MG tablet Take 12.5 mg by mouth daily. 1/2 Tablet = 12.5mg   . traMADol (ULTRAM) 50 MG tablet Take 1 tablet (50 mg total) by mouth  every 6 (six) hours.   No facility-administered encounter medications on file as of 11/14/2019.     SIGNIFICANT DIAGNOSTIC EXAMS  PREVIOUS  02-21-19: DEXA: t score -3.504  NO NEW EXAMS.    LABS REVIEWED PREVIOUS:   11-14-18: dig 0.9 12-18-18: hgb a1c 7.8   01-24-19: urine micro-albumin: 5.3 04-23-19: wbc 9.6; hgb 13.0; hct 41.4; mcv 90.6 plt 280; glucose 187; bun  37 creat 1.36; k+ 3.3; na++ 139; ca 9.3 ;liver normal albumin 3.2 05-01-19: k+ 4.3 05-09-19: k+ 3.3; vit B 12: 480 05-13-19: k+ 3.8 05-31-19: glucose 287; bun 27; creat 1.39; k+ 4.4; na++ 138; ca 9.4 07-04-30: wbc 8.9; hgb 10.7; hct 33.9; mcv 92.1 plt 266; glucose 118; bun 33; creat 1.23; k+ 3.2; na++ 137; ca 9.2 chol 129; ldl 82 trig 125; hdl 22 07-11-19: k+ 3.3 07-16-19: glucose 112; bun 26; crat 1.15; k+ 4.2; na++ 139; ca 9.3    NO NEW LABS  Review of Systems  Unable to perform ROS: Dementia (is unable to participate )    Physical Exam Constitutional:      General: She is not in acute distress.    Appearance: She is well-developed. She is not diaphoretic.  Neck:     Thyroid: No thyromegaly.  Cardiovascular:     Rate and Rhythm: Normal rate and regular rhythm.     Heart sounds: Normal heart sounds.  Pulmonary:     Effort: Pulmonary effort is normal. No respiratory distress.     Breath sounds: Normal breath sounds.  Chest:     Comments: Right breast mass;large and painful to touch Abdominal:     General: Bowel sounds are normal. There is no distension.     Palpations: Abdomen is soft.     Tenderness: There is no abdominal tenderness.  Musculoskeletal:     Right lower leg: No edema.     Left lower leg: No edema.     Comments: Able to move all extremities   Lymphadenopathy:     Cervical: No cervical adenopathy.  Skin:    General: Skin is warm and dry.  Neurological:     Mental Status: She is alert. Mental status is at baseline.  Psychiatric:        Mood and Affect: Mood normal.      ASSESSMENT/  PLAN:  TODAY:   1. 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 Chronic atrial fibrillation: is stable will continue digoxin 0.125 mg daily lopressor 50 mg twice daily for rate control is on long term eliquis 5 mg twice daily   3. Bilateral lower extremity edema: is stable will continue lasix 20 mg daily and aldactone 12.5 mg daily   PREVIOUS   4. Hypokalemia: is stable k+ 4.2 will continue k+ 40 meq four times daily   5. GERD without esophagitis: is stable will continue prilosec 40 mg daily   6. Unspecified dementia with behavioral disturbance is without significant change in status is slowly losing weight; weight is 180 pounds will continue namenda 10 mg twice daily unfortunately weight loss is an expected outcome in the late stages of this disease process.   7. Right breast cancer: is without change in status; is awaiting palliative mastectomy    8. Age related osteoporosis without current pathological fracture: is stable t score -3.504 off prolia will monitor   9. Major depression current chronic: is stable is off medications will monitor   10. Type 2 diabetes mellitus uncontrolled with peripheral circulatory disorder: hgb a1c 7.1 will continue junamet 50/500 mg twice daily basaglar 53 units and novolog 17 units meals.   11. Dyslipidemia associated with type 2 diabetes mellitus: is stable LDL 125 will monitor   12. CKD stage 2 due to type 2 diabetes mellitus is stable bun 26; creat 1.15  MD is aware of resident's narcotic use and is in agreement with current plan of care. We will attempt to wean resident as appropriate.  Kalisi Bevill NP [facility]  Contact [phone] Monday through Friday 8am- 5pm  After hours call [phone]   

## 2019-11-19 ENCOUNTER — Non-Acute Institutional Stay (SKILLED_NURSING_FACILITY): Payer: Medicare Other | Admitting: Adult Health

## 2019-11-19 ENCOUNTER — Encounter: Payer: Self-pay | Admitting: Adult Health

## 2019-11-19 DIAGNOSIS — E1121 Type 2 diabetes mellitus with diabetic nephropathy: Secondary | ICD-10-CM | POA: Diagnosis not present

## 2019-11-19 DIAGNOSIS — Z794 Long term (current) use of insulin: Secondary | ICD-10-CM

## 2019-11-19 DIAGNOSIS — I5032 Chronic diastolic (congestive) heart failure: Secondary | ICD-10-CM

## 2019-11-19 DIAGNOSIS — F0391 Unspecified dementia with behavioral disturbance: Secondary | ICD-10-CM

## 2019-11-19 DIAGNOSIS — C50911 Malignant neoplasm of unspecified site of right female breast: Secondary | ICD-10-CM

## 2019-11-19 DIAGNOSIS — R52 Pain, unspecified: Secondary | ICD-10-CM

## 2019-11-19 DIAGNOSIS — N1831 Chronic kidney disease, stage 3a: Secondary | ICD-10-CM

## 2019-11-19 DIAGNOSIS — E1122 Type 2 diabetes mellitus with diabetic chronic kidney disease: Secondary | ICD-10-CM

## 2019-11-19 DIAGNOSIS — E876 Hypokalemia: Secondary | ICD-10-CM | POA: Diagnosis not present

## 2019-11-19 MED ORDER — TRAMADOL HCL 50 MG PO TABS: ORAL_TABLET | ORAL | 0 refills | Status: DC

## 2019-11-19 NOTE — Progress Notes (Signed)
Location:  [facility] Room Number: 113/W Place of Service:  SNF (31) Provider:  Durenda Age, DNP, FNP-BC  Patient Care Team: Gerlene Fee, NP as PCP - General (Geriatric Medicine) Rothbart, Cristopher Estimable, MD (Cardiology) [facility] [place] ([place]) Donetta Potts, RN as Oncology Nurse Navigator (Oncology) Derek Jack, MD as Consulting Physician (Hematology)  Extended Emergency Contact Information Primary Emergency Contact: [facility] Address: [address]          [place], [place] [address] [name] of [place] Phone: [phone] Mobile Phone: [phone] Relation: Daughter  Code Status:  DNR  Goals of care: Advanced Directive information Advanced Directives 11/19/2019  Does Patient Have a Medical Advance Directive? Yes  Type of Advance Directive Out of facility DNR (pink MOST or yellow form)  Does patient want to make changes to medical advance directive? No - Patient declined  Copy of [place] in Chart? -  Pre-existing out of facility DNR order (yellow form or pink MOST form) Yellow form placed in chart (order not valid for inpatient use)     Chief Complaint  Patient presents with  . Acute Visit    Pain Management    HPI:  Pt is a 79 y.o. female seen today for pain management. She has a PMH of atrial fibrillation, bilateral lower extremity, COPD, dementia, gout, CHF and diabetes mellitus. She was seen in the room today. Staff reported that she has been complaining of increased generalized pain. She has a diagnosis of right breast cancer. She complained of pain when her feet were touched. She has 2+edema on bilateral feet. Currently she takes Tramadol 50 mg every 6 hours routinely and Acetaminophen 650 mg every 6 hours for pain. Review of labs showed that last hgb was 10.7, 07/04/19. There was no reported SOB nor fever. She currently takes Lasix, Lopressor and Spironolactone for  CHF.    Past Medical History:  Diagnosis Date  . Allergy   . Atrial fibrillation ([place]) 08/2011   First diagnosed in 08/2011; duration of arrhythmia is uncertain  . Bilateral lower extremity edema   . Chronic diarrhea    diverticulosis  . COPD (chronic obstructive pulmonary disease) ([place])   . Dementia ([place])   . Diabetes mellitus, type 2 (HCC)    Diabetic neuropathy  . Dyspnea on exertion    pedal edema  . Gout   . Headache(784.0)    twice weekly  . Hyperlipidemia   . Hypertension   . Osteopenia    DEXA scan 01/2010  . Palpitations   . Seasonal allergies   . Stress incontinence   . Vertigo    Past Surgical History:  Procedure Laterality Date  . APPENDECTOMY    . BREAST BIOPSY  2002   Right  . CESAREAN SECTION     x 2  . CHOLECYSTECTOMY    . COLONOSCOPY  2007   Negative screening study  . COLONOSCOPY WITH PROPOFOL N/A 09/18/2017   Procedure: COLONOSCOPY WITH PROPOFOL;  Surgeon: Daneil Dolin, MD;  [facility];  Service: Endoscopy;  Laterality: N/A;  11:00am  . HAMMER TOE SURGERY     Bilateral hammer toe amputation  . INCISIONAL HERNIA REPAIR    . KNEE ARTHROSCOPY W/ MENISCAL REPAIR  2007   Bilateral  . POLYPECTOMY  09/18/2017   Procedure: POLYPECTOMY;  Surgeon: Daneil Dolin, MD;  [facility];  Service: Endoscopy;;  colon  . UMBILICAL HERNIA REPAIR  Allergies  Allergen Reactions  . Codeine Sulfate [Codeine] Anaphylaxis and Hives  . Morphine And Related Anaphylaxis and Hives  . Penicillins Anaphylaxis  . Ace Inhibitors Cough    Outpatient Encounter Medications as of 11/19/2019  Medication Sig  . acetaminophen (TYLENOL) 325 MG tablet Take 650 mg by mouth every 6 (six) hours as needed.   Marland Kitchen apixaban (ELIQUIS) 5 MG TABS tablet Take 5 mg by mouth 2 (two) times daily.   . carboxymethylcellulose (REFRESH PLUS) 0.5 % SOLN Place 1 drop into both eyes 4 (four) times daily.  . digoxin (LANOXIN) 0.125 MG tablet Take 1 tablet by mouth once a  day (HOLD FOR AP UNDER 60)  . furosemide (LASIX) 20 MG tablet Take 20 mg by mouth daily.  Marland Kitchen guaiFENesin-dextromethorphan (ROBITUSSIN DM) 100-10 MG/5ML syrup Take 15 mLs by mouth every 6 (six) hours as needed for cough. For cough/congestion. May give up to 48 hrs. Inform physician immediately if cough or congestion is associated with fever or SOB  . insulin aspart (NOVOLOG) 100 UNIT/ML injection Inject 17 Units into the skin 3 (three) times daily with meals. If BS above 150  . LANTUS SOLOSTAR 100 UNIT/ML Solostar Pen Inject 53 Units into the skin at bedtime.   . memantine (NAMENDA) 10 MG tablet Take 10 mg by mouth 2 (two) times daily.  . metolazone (ZAROXOLYN) 5 MG tablet Take 5 mg by mouth daily. On Monday and Friday   . metoprolol tartrate (LOPRESSOR) 50 MG tablet Take 50 mg by mouth 2 (two) times daily.  . NON FORMULARY Diet Type:  NAS, consistent CHO, Dysphagia 3 with thin liquids  . NON FORMULARY Give Cranberry Juice twice a day  . omeprazole (PRILOSEC) 40 MG capsule Take 40 mg by mouth daily.  . polyethylene glycol (MIRALAX / GLYCOLAX) packet Take 17 g by mouth daily as needed. daily prn for constipation - mix with 6 oz of liquid and drink  . potassium chloride SA (KLOR-CON) 20 MEQ tablet Take 40 mEq by mouth 4 (four) times daily.   . sitaGLIPtin-metformin (JANUMET) 50-500 MG tablet Take 1 tablet by mouth 2 (two) times daily with a meal.  . spironolactone (ALDACTONE) 25 MG tablet Take 12.5 mg by mouth daily. 1/2 Tablet = 12.5mg   . traMADol (ULTRAM) 50 MG tablet Take 1 tablet (50 mg total) by mouth every 6 (six) hours.   No facility-administered encounter medications on file as of 11/19/2019.    Review of Systems  Unable to obtain due to dementia    Immunization History  Administered Date(s) Administered  . Influenza-Unspecified 02/03/2017, 02/01/2018, 02/04/2019  . Moderna SARS-COVID-2 Vaccination 05/08/2019, 06/05/2019  . Pneumococcal Conjugate-13 02/03/2017  .  Pneumococcal-Unspecified 02/08/2016  . Tdap 01/19/2017  . Zoster 08/02/2018   Pertinent  Health Maintenance Due  Topic Date Due  . INFLUENZA VACCINE  12/01/2019  . HEMOGLOBIN A1C  01/04/2020  . URINE MICROALBUMIN  01/24/2020  . FOOT EXAM  02/22/2020  . OPHTHALMOLOGY EXAM  03/10/2020  . DEXA SCAN  Completed  . PNA vac Low Risk Adult  Completed   Fall Risk  07/30/2019 12/17/2018 12/17/2018 01/30/2018 01/19/2017  Falls in the past year? 1 1 1  No No  Number falls in past yr: 0 1 1 - -  Injury with Fall? 0 0 0 - -  Risk for fall due to : History of fall(s);Impaired balance/gait;Impaired mobility History of fall(s);Impaired balance/gait;Impaired mobility History of fall(s);Impaired balance/gait;Impaired mobility - -  Follow up - Falls evaluation completed Falls evaluation completed - -  Vitals:   11/19/19 1126  BP: 112/62  Pulse: 92  Resp: 20  Temp: 97.7 F (36.5 C)  TempSrc: Oral  Weight: 180 lb (81.6 kg)  Height: 5\' 6"  (1.676 m)   Body mass index is 29.05 kg/m.  Physical Exam  GENERAL APPEARANCE: Well nourished. In no acute distress. Obese SKIN:  Skin is warm and dry.  MOUTH and THROAT: Lips are without lesions. Oral mucosa is moist and without lesions. Tongue is normal in shape, size, and color and without lesions RESPIRATORY: Breathing is even & unlabored, BS CTAB CARDIAC: irregular rhythm, no murmur,no extra heart sounds, BLE 2+ edema, tender to touch GI: Abdomen soft, normal BS, no masses, no tenderness NEUROLOGICAL: There is no tremor. Speech is clear. Alert to self, disoriented to time and place. PSYCHIATRIC:  Affect and behavior are appropriate  Labs reviewed: Recent Labs    05/31/19 0405 05/31/19 0405 07/04/19 0420 07/11/19 0818 07/16/19 0340  NA 138  --  137  --  139  K 4.4   < > 3.2* 3.3* 4.2  CL 100  --  99  --  104  CO2 28  --  29  --  28  GLUCOSE 287*  --  118*  --  112*  BUN 27*  --  33*  --  26*  CREATININE 1.39*  --  1.23*  --  1.15*  CALCIUM  9.4  --  9.2  --  9.3   < > = values in this interval not displayed.   Recent Labs    04/23/19 1227  AST 21  ALT 19  ALKPHOS 60  BILITOT 0.5  PROT 7.4  ALBUMIN 3.2*   Recent Labs    04/23/19 1227 07/04/19 0420  WBC 9.6 8.9  NEUTROABS 6.4  --   HGB 13.0 10.7*  HCT 41.4 33.9*  MCV 90.6 92.1  PLT 280 266   Lab Results  Component Value Date   TSH 2.028 07/04/2019   Lab Results  Component Value Date   HGBA1C 7.1 (H) 07/04/2019   Lab Results  Component Value Date   CHOL 129 07/04/2019   HDL 22 (L) 07/04/2019   LDLCALC 82 07/04/2019   TRIG 125 07/04/2019   CHOLHDL 5.9 07/04/2019     Assessment/Plan  1. Generalized pain -  Staff reported that she has been complaining of more pain, will increase tramadol from 50 mg every 6 hours to 75 mg every 6 hours and to hold when sleeping/sedated and continue acetaminophen 650 mg every 6 hours - traMADol (ULTRAM) 50 MG tablet; Give 1.5 tab = 75 mg orally 8 hours  Dispense: 60 tablet; Refill: 0  2. Malignant neoplasm of right female breast, unspecified estrogen receptor status, unspecified site of breast ([place]) -   Consulted with surgeon on 10/18/2019, and daughter has spoken with surgeon -  Daughter to discuss treatment plan with oncology  3. Chronic diastolic congestive heart failure (HCC) -   No SOB, continue metolazone, furosemide, Lopressor and spironolactone  4. Type 2 diabetes mellitus with stage 3a chronic kidney disease, with long-term current use of insulin (HCC) Lab Results  Component Value Date   HGBA1C 7.1 (H) 07/04/2019   -  CBGs stable -   Continue Lantus and Janumet  5. Hypokalemia Lab Results  Component Value Date   K 4.2 07/16/2019   -   Continue KCl supplementation  6. Dementia with behavioral disturbance, unspecified dementia type ([place]) -Continue memantine and supportive care  Family/ staff Communication:   Discussed plan of care with resident and charge nurse.  Labs/tests ordered:   CBC  and CMP  Goals of care:   Long-term care   Durenda Age, DNP, FNP-BC [facility] [phone] (Monday-Friday 8:00 a.m. - 5:00 p.m.) [phone] (after hours)

## 2019-11-20 ENCOUNTER — Other Ambulatory Visit (HOSPITAL_COMMUNITY)
Admission: RE | Admit: 2019-11-20 | Discharge: 2019-11-20 | Disposition: A | Discharge status: Home / Self Care | Payer: Medicare Other | Source: Skilled Nursing Facility | Attending: Adult Health | Admitting: Adult Health

## 2019-11-20 DIAGNOSIS — E1140 Type 2 diabetes mellitus with diabetic neuropathy, unspecified: Secondary | ICD-10-CM | POA: Insufficient documentation

## 2019-11-20 DIAGNOSIS — F4323 Adjustment disorder with mixed anxiety and depressed mood: Secondary | ICD-10-CM | POA: Diagnosis not present

## 2019-11-20 LAB — COMPREHENSIVE METABOLIC PANEL
ALT: 14 U/L (ref 0–44)
AST: 17 U/L (ref 15–41)
Albumin: 3.2 g/dL — ABNORMAL LOW (ref 3.5–5.0)
Alkaline Phosphatase: 49 U/L (ref 38–126)
Anion gap: 9 (ref 5–15)
BUN: 27 mg/dL — ABNORMAL HIGH (ref 8–23)
CO2: 24 mmol/L (ref 22–32)
Calcium: 9.3 mg/dL (ref 8.9–10.3)
Chloride: 106 mmol/L (ref 98–111)
Creatinine, Ser: 1.07 mg/dL — ABNORMAL HIGH (ref 0.44–1.00)
GFR calc Af Amer: 57 mL/min — ABNORMAL LOW (ref >60)
GFR calc non Af Amer: 49 mL/min — ABNORMAL LOW (ref >60)
Glucose, Bld: 92 mg/dL (ref 70–99)
Potassium: 4.5 mmol/L (ref 3.5–5.1)
Sodium: 139 mmol/L (ref 135–145)
Total Bilirubin: 0.6 mg/dL (ref 0.3–1.2)
Total Protein: 7.2 g/dL (ref 6.5–8.1)

## 2019-11-20 LAB — CBC
HCT: 39 % (ref 36.0–46.0)
Hemoglobin: 12 g/dL (ref 12.0–15.0)
MCH: 28.6 pg (ref 26.0–34.0)
MCHC: 30.8 g/dL (ref 30.0–36.0)
MCV: 93.1 fL (ref 80.0–100.0)
Platelets: 227 10*3/uL (ref 150–400)
RBC: 4.19 MIL/uL (ref 3.87–5.11)
RDW: 14.4 % (ref 11.5–15.5)
WBC: 8.1 10*3/uL (ref 4.0–10.5)
nRBC: 0 % (ref 0.0–0.2)

## 2019-11-27 DIAGNOSIS — F4323 Adjustment disorder with mixed anxiety and depressed mood: Secondary | ICD-10-CM | POA: Diagnosis not present

## 2019-12-02 ENCOUNTER — Other Ambulatory Visit: Payer: Self-pay | Admitting: Adult Health

## 2019-12-02 DIAGNOSIS — R52 Pain, unspecified: Secondary | ICD-10-CM

## 2019-12-02 MED ORDER — TRAMADOL HCL 50 MG PO TABS: ORAL_TABLET | ORAL | 0 refills | Status: DC

## 2019-12-03 ENCOUNTER — Encounter: Payer: Self-pay | Admitting: Adult Health

## 2019-12-03 ENCOUNTER — Non-Acute Institutional Stay (SKILLED_NURSING_FACILITY): Payer: Medicare Other | Admitting: Adult Health

## 2019-12-03 DIAGNOSIS — Z794 Long term (current) use of insulin: Secondary | ICD-10-CM

## 2019-12-03 DIAGNOSIS — E118 Type 2 diabetes mellitus with unspecified complications: Secondary | ICD-10-CM

## 2019-12-03 NOTE — Progress Notes (Signed)
Location:    [place] Room Number: 113/W Place of Service:  SNF (31)   CODE STATUS: DNR  Allergies  Allergen Reactions  . Codeine Sulfate [Codeine] Anaphylaxis and Hives  . Morphine And Related Anaphylaxis and Hives  . Penicillins Anaphylaxis  . Ace Inhibitors Cough    Chief Complaint  Patient presents with  . Acute Visit    Diabetes    HPI:  She has been having some low cbg readings in the AM. She is presently taking lantus 53 units nightly. Her intake is variable. There are no reports of anxiety or agitation.   Past Medical History:  Diagnosis Date  . Allergy   . Atrial fibrillation ([place]) 08/2011   First diagnosed in 08/2011; duration of arrhythmia is uncertain  . Bilateral lower extremity edema   . Chronic diarrhea    diverticulosis  . COPD (chronic obstructive pulmonary disease) ([place])   . Dementia ([place])   . Diabetes mellitus, type 2 (H[facility])    Diabetic neuropathy  . Dyspnea on exertion    pedal edema  . Gout   . Headache(784.0)    twice weekly  . Hyperlipidemia   . Hypertension   . Osteopenia    DEXA scan 01/2010  . Palpitations   . Seasonal allergies   . Stress incontinence   . Vertigo     Past Surgical History:  Procedure Laterality Date  . APPENDECTOMY    . BREAST BIOPSY  2002   Right  . CESAREAN SECTION     x 2  . CHOLECYSTECTOMY    . COLONOSCOPY  2007   Negative screening study  . COLONOSCOPY WITH PROPOFOL N/A 09/18/2017   Procedure: COLONOSCOPY WITH PROPOFOL;  Surgeon: Daneil Dolin, MD;  [facility];  Service: Endoscopy;  Laterality: N/A;  11:00am  . HAMMER TOE SURGERY     Bilateral hammer toe amputation  . INCISIONAL HERNIA REPAIR    . KNEE ARTHROSCOPY W/ MENISCAL REPAIR  2007   Bilateral  . POLYPECTOMY  09/18/2017   Procedure: POLYPECTOMY;  Surgeon: Daneil Dolin, MD;  [facility];  Service: Endoscopy;;  colon  . UMBILICAL HERNIA REPAIR      Social History   Socioeconomic History    . Marital status: Widowed    Spouse name: Not on file  . Number of children: 2  . Years of education: Not on file  . Highest education level: Not on file  Occupational History  . Occupation: Engineer, materials  Tobacco Use  . Smoking status: Never Smoker  . Smokeless tobacco: Never Used  Vaping Use  . Vaping Use: Never used  Substance and Sexual Activity  . Alcohol use: No  . Drug use: No  . Sexual activity: Not Currently  Other Topics Concern  . Not on file  Social History Narrative  . Not on file   Social Determinants of Health   Financial Resource Strain: Low Risk   . Difficulty of Paying Living Expenses: Not hard at all  Food Insecurity: No Food Insecurity  . Worried About Charity fundraiser in the Last Year: Never true  . Ran Out of Food in the Last Year: Never true  Transportation Needs: No Transportation Needs  . Lack of Transportation (Medical): No  . Lack of Transportation (Non-Medical): No  Physical Activity: Inactive  . Days of Exercise per Week: 0 days  . Minutes of Exercise per Session: 0 min  Stress:   . Feeling  of Stress :   Social Connections: Socially Isolated  . Frequency of Communication with Friends and Family: Never  . Frequency of Social Gatherings with Friends and Family: Never  . Attends Religious Services: Never  . Active Member of Clubs or Organizations: Yes  . Attends Archivist Meetings: Never  . Marital Status: Widowed  Intimate Partner Violence: Not At Risk  . Fear of Current or Ex-Partner: No  . Emotionally Abused: No  . Physically Abused: No  . Sexually Abused: No   Family History  Adopted: Yes      VITAL SIGNS BP 119/79   Pulse 75   Temp 97.7 F (36.5 C) (Oral)   Ht 5\' 6"  (1.676 m)   Wt 180 lb (81.6 kg)   BMI 29.05 kg/m   Outpatient Encounter Medications as of 12/03/2019  Medication Sig  . acetaminophen (TYLENOL) 325 MG tablet Take 650 mg by mouth every 6 (six) hours as needed.   Marland Kitchen apixaban (ELIQUIS) 5 MG  TABS tablet Take 5 mg by mouth 2 (two) times daily.   . carboxymethylcellulose (REFRESH PLUS) 0.5 % SOLN Place 1 drop into both eyes 4 (four) times daily.  . digoxin (LANOXIN) 0.125 MG tablet Take 1 tablet by mouth once a day (HOLD FOR AP UNDER 60)  . furosemide (LASIX) 20 MG tablet Take 20 mg by mouth daily.  Marland Kitchen guaiFENesin-dextromethorphan (ROBITUSSIN DM) 100-10 MG/5ML syrup Take 15 mLs by mouth every 6 (six) hours as needed for cough. For cough/congestion. May give up to 48 hrs. Inform physician immediately if cough or congestion is associated with fever or SOB  . insulin aspart (NOVOLOG) 100 UNIT/ML injection Inject 17 Units into the skin 3 (three) times daily with meals. If BS above 150  . LANTUS SOLOSTAR 100 UNIT/ML Solostar Pen Inject 40 Units into the skin at bedtime.   . memantine (NAMENDA) 10 MG tablet Take 10 mg by mouth 2 (two) times daily.  . metolazone (ZAROXOLYN) 5 MG tablet Take 5 mg by mouth daily. On Monday and Friday   . metoprolol tartrate (LOPRESSOR) 50 MG tablet Take 50 mg by mouth 2 (two) times daily.  . NON FORMULARY Diet Type:  NAS, consistent CHO, Dysphagia 3 with thin liquids  . NON FORMULARY Give Cranberry Juice twice a day  . omeprazole (PRILOSEC) 40 MG capsule Take 40 mg by mouth daily.  . polyethylene glycol (MIRALAX / GLYCOLAX) packet Take 17 g by mouth daily as needed. daily prn for constipation - mix with 6 oz of liquid and drink  . potassium chloride SA (KLOR-CON) 20 MEQ tablet Take 40 mEq by mouth 4 (four) times daily.   . sitaGLIPtin-metformin (JANUMET) 50-500 MG tablet Take 1 tablet by mouth 2 (two) times daily with a meal.  . spironolactone (ALDACTONE) 25 MG tablet Take 12.5 mg by mouth daily. 1/2 Tablet = 12.5mg   . traMADol (ULTRAM) 50 MG tablet Take 75 mg by mouth 3 (three) times daily. Special Instructions: hold if asleep/don't wake up pt  . [DISCONTINUED] traMADol (ULTRAM) 50 MG tablet Give 1.5 tab = 75 mg orally 8 hours   No facility-administered  encounter medications on file as of 12/03/2019.     SIGNIFICANT DIAGNOSTIC EXAMS   PREVIOUS  02-21-19: DEXA: t score -3.504  NO NEW EXAMS.    LABS REVIEWED PREVIOUS:   12-18-18: hgb a1c 7.8   01-24-19: urine micro-albumin: 5.3 04-23-19: wbc 9.6; hgb 13.0; hct 41.4; mcv 90.6 plt 280; glucose 187; bun 37 creat 1.36; k+  3.3; na++ 139; ca 9.3 ;liver normal albumin 3.2 05-01-19: k+ 4.3 05-09-19: k+ 3.3; vit B 12: 480 05-13-19: k+ 3.8 05-31-19: glucose 287; bun 27; creat 1.39; k+ 4.4; na++ 138; ca 9.4 07-04-30: wbc 8.9; hgb 10.7; hct 33.9; mcv 92.1 plt 266; glucose 118; bun 33; creat 1.23; k+ 3.2; na++ 137; ca 9.2 chol 129; ldl 82 trig 125; hdl 22 07-11-19: k+ 3.3 07-16-19: glucose 112; bun 26; crat 1.15; k+ 4.2; na++ 139; ca 9.3    TODAY  11-20-19: wbc 8.1; hgb 12.0; hct 39.0; mcv 93.1 plt 227; glucose 92; bun 27; creat 1.07; k+ 4.5; an++ 139; ca 9.3 liver normal albumin 3.2    Review of Systems  Unable to perform ROS: Dementia (unable to participate )    Physical Exam Constitutional:      General: She is not in acute distress.    Appearance: She is well-developed. She is not diaphoretic.  Neck:     Thyroid: No thyromegaly.  Cardiovascular:     Rate and Rhythm: Normal rate and regular rhythm.     Heart sounds: Normal heart sounds.  Pulmonary:     Effort: Pulmonary effort is normal. No respiratory distress.     Breath sounds: Normal breath sounds.  Chest:     Comments: Right breast mass;large and painful to touch Abdominal:     General: Bowel sounds are normal. There is no distension.     Palpations: Abdomen is soft.     Tenderness: There is no abdominal tenderness.  Musculoskeletal:     Cervical back: Neck supple.     Right lower leg: No edema.     Left lower leg: No edema.     Comments: Able to move all extremities   Lymphadenopathy:     Cervical: No cervical adenopathy.  Skin:    General: Skin is warm and dry.  Neurological:     Mental Status: She is alert. Mental  status is at baseline.  Psychiatric:        Mood and Affect: Mood normal.      ASSESSMENT/ PLAN:  TODAY  1. Type 2 diabetes mellitus uncontrolled with peripheral circulatory disorder: hgb a1c 7.1 will continue junamet 50/500 mg twice daily novolog 17 units with meals Will lower basaglar to 40 units will monitor her status Will check hgb a1c    Type 2 diabetes mellitus uncontrolled with peripheral circulatory disorder: hgb a1c 7.1 will continue junamet 50/500 mg twice daily basaglar 53 units and novolog 17 units meals.   MD is aware of resident's narcotic use and is in agreement with current plan of care. We will attempt to wean resident as appropriate.  Ok Edwards NP [facility]  Contact [phone] Monday through Friday 8am- 5pm  After hours call [phone]

## 2019-12-04 DIAGNOSIS — Z961 Presence of intraocular lens: Secondary | ICD-10-CM | POA: Diagnosis not present

## 2019-12-04 DIAGNOSIS — H04123 Dry eye syndrome of bilateral lacrimal glands: Secondary | ICD-10-CM | POA: Diagnosis not present

## 2019-12-04 DIAGNOSIS — F4323 Adjustment disorder with mixed anxiety and depressed mood: Secondary | ICD-10-CM | POA: Diagnosis not present

## 2019-12-04 DIAGNOSIS — E113293 Type 2 diabetes mellitus with mild nonproliferative diabetic retinopathy without macular edema, bilateral: Secondary | ICD-10-CM | POA: Diagnosis not present

## 2019-12-04 LAB — HM DIABETES EYE EXAM

## 2019-12-09 ENCOUNTER — Encounter (HOSPITAL_COMMUNITY)
Admission: RE | Admit: 2019-12-09 | Discharge: 2019-12-09 | Disposition: A | Discharge status: Home / Self Care | Payer: Medicare Other | Source: Skilled Nursing Facility | Attending: Adult Health | Admitting: Adult Health

## 2019-12-09 DIAGNOSIS — E1140 Type 2 diabetes mellitus with diabetic neuropathy, unspecified: Secondary | ICD-10-CM | POA: Diagnosis not present

## 2019-12-09 LAB — HEMOGLOBIN A1C
Hgb A1c MFr Bld: 6.4 % — ABNORMAL HIGH (ref 4.8–5.6)
Mean Plasma Glucose: 136.98 mg/dL

## 2019-12-17 ENCOUNTER — Encounter: Payer: Self-pay | Admitting: Adult Health

## 2019-12-17 ENCOUNTER — Non-Acute Institutional Stay (SKILLED_NURSING_FACILITY): Payer: Medicare Other | Admitting: Adult Health

## 2019-12-17 DIAGNOSIS — E876 Hypokalemia: Secondary | ICD-10-CM

## 2019-12-17 DIAGNOSIS — K219 Gastro-esophageal reflux disease without esophagitis: Secondary | ICD-10-CM

## 2019-12-17 DIAGNOSIS — F0391 Unspecified dementia with behavioral disturbance: Secondary | ICD-10-CM

## 2019-12-17 NOTE — Progress Notes (Signed)
Location:    [place] Room Number: 113/W Place of Service:  SNF (31)   CODE STATUS: DNR  Allergies  Allergen Reactions   Codeine Sulfate [Codeine] Anaphylaxis and Hives   Morphine And Related Anaphylaxis and Hives   Penicillins Anaphylaxis   Ace Inhibitors Cough    Chief Complaint  Patient presents with   Medical Management of Chronic Issues            Hypokalemia:   GERD without esophagitis:  Dementia with behavioral disturbance unspecified dementia type:    HPI:  She is an 79 year old long term resident of this facility being seen for the management of her chronic illnesses: Hypokalemia: GERD without esophagitis:  Dementia with behavioral disturbance unspecified dementia type:  Her pain is being managed with routine ultram at this time. No reports of agitation or anxiety.   Past Medical History:  Diagnosis Date   Allergy    Atrial fibrillation ([place]) 08/2011   First diagnosed in 08/2011; duration of arrhythmia is uncertain   Bilateral lower extremity edema    Chronic diarrhea    diverticulosis   COPD (chronic obstructive pulmonary disease) (HCC)    Dementia (HCC)    Diabetes mellitus, type 2 ([place])    Diabetic neuropathy   Dyspnea on exertion    pedal edema   Gout    Headache(784.0)    twice weekly   Hyperlipidemia    Hypertension    Osteopenia    DEXA scan 01/2010   Palpitations    Seasonal allergies    Stress incontinence    Vertigo     Past Surgical History:  Procedure Laterality Date   APPENDECTOMY     BREAST BIOPSY  2002   Right   CESAREAN SECTION     x 2   CHOLECYSTECTOMY     COLONOSCOPY  2007   Negative screening study   COLONOSCOPY WITH PROPOFOL N/A 09/18/2017   Procedure: COLONOSCOPY WITH PROPOFOL;  Surgeon: Daneil Dolin, MD;  [facility];  Service: Endoscopy;  Laterality: N/A;  11:00am   HAMMER TOE SURGERY     Bilateral hammer toe amputation   INCISIONAL HERNIA REPAIR      KNEE ARTHROSCOPY W/ MENISCAL REPAIR  2007   Bilateral   POLYPECTOMY  09/18/2017   Procedure: POLYPECTOMY;  Surgeon: Daneil Dolin, MD;  [facility];  Service: Endoscopy;;  colon   UMBILICAL HERNIA REPAIR      Social History   Socioeconomic History   Marital status: Widowed    Spouse name: Not on file   Number of children: 2   Years of education: Not on file   Highest education level: Not on file  Occupational History   Occupation: Engineer, materials  Tobacco Use   Smoking status: Never Smoker   Smokeless tobacco: Never Used  Scientific laboratory technician Use: Never used  Substance and Sexual Activity   Alcohol use: No   Drug use: No   Sexual activity: Not Currently  Other Topics Concern   Not on file  Social History Narrative   Not on file   Social Determinants of Health   Financial Resource Strain: Low Risk    Difficulty of Paying Living Expenses: Not hard at all  Food Insecurity: No Food Insecurity   Worried About Charity fundraiser in the Last Year: Never true   [place] in the Last Year: Never true  Transportation Needs: No Transportation  Needs   Lack of Transportation (Medical): No   Lack of Transportation (Non-Medical): No  Physical Activity: Inactive   Days of Exercise per Week: 0 days   Minutes of Exercise per Session: 0 min  Stress:    Feeling of Stress :   Social Connections: Socially Isolated   Frequency of Communication with Friends and Family: Never   Frequency of Social Gatherings with Friends and Family: Never   Attends Religious Services: Never   Marine scientist or Organizations: Yes   Attends Archivist Meetings: Never   Marital Status: Widowed  Human resources officer Violence: Not At Risk   Fear of Current or Ex-Partner: No   Emotionally Abused: No   Physically Abused: No   Sexually Abused: No   Family History  Adopted: Yes      VITAL SIGNS BP 104/64    Pulse 85    Temp 98.1 F  (36.7 C) (Oral)    Resp 18    Ht 5\' 6"  (1.676 m)    Wt 174 lb (78.9 kg)    BMI 28.08 kg/m   Outpatient Encounter Medications as of 12/17/2019  Medication Sig   acetaminophen (TYLENOL) 325 MG tablet Take 650 mg by mouth every 6 (six) hours as needed.    apixaban (ELIQUIS) 5 MG TABS tablet Take 5 mg by mouth 2 (two) times daily.    carboxymethylcellulose (REFRESH PLUS) 0.5 % SOLN Place 1 drop into both eyes 4 (four) times daily.   digoxin (LANOXIN) 0.125 MG tablet Take 1 tablet by mouth once a day (HOLD FOR AP UNDER 60)   furosemide (LASIX) 20 MG tablet Take 20 mg by mouth daily.   guaiFENesin-dextromethorphan (ROBITUSSIN DM) 100-10 MG/5ML syrup Take 15 mLs by mouth every 6 (six) hours as needed for cough. For cough/congestion. May give up to 48 hrs. Inform physician immediately if cough or congestion is associated with fever or SOB   insulin aspart (NOVOLOG) 100 UNIT/ML injection Inject 17 Units into the skin 3 (three) times daily with meals. If BS above 150   LANTUS SOLOSTAR 100 UNIT/ML Solostar Pen Inject 40 Units into the skin at bedtime.    memantine (NAMENDA) 10 MG tablet Take 10 mg by mouth 2 (two) times daily.   metolazone (ZAROXOLYN) 5 MG tablet Take 5 mg by mouth daily. On Monday and Friday    metoprolol tartrate (LOPRESSOR) 50 MG tablet Take 50 mg by mouth 2 (two) times daily.   NON FORMULARY Diet Type:  NAS, consistent CHO, Dysphagia 3 with thin liquids   NON FORMULARY Give Cranberry Juice twice a day   omeprazole (PRILOSEC) 40 MG capsule Take 40 mg by mouth daily.   polyethylene glycol (MIRALAX / GLYCOLAX) packet Take 17 g by mouth daily as needed. daily prn for constipation - mix with 6 oz of liquid and drink   potassium chloride SA (KLOR-CON) 20 MEQ tablet Take 40 mEq by mouth 4 (four) times daily.    sitaGLIPtin-metformin (JANUMET) 50-500 MG tablet Take 1 tablet by mouth 2 (two) times daily with a meal.   spironolactone (ALDACTONE) 25 MG tablet Take 12.5 mg by  mouth daily. 1/2 Tablet = 12.5mg    traMADol (ULTRAM) 50 MG tablet Take 75 mg by mouth 3 (three) times daily. Special Instructions: hold if asleep/don't wake up pt   No facility-administered encounter medications on file as of 12/17/2019.     SIGNIFICANT DIAGNOSTIC EXAMS   PREVIOUS  02-21-19: DEXA: t score -3.504  NO NEW EXAMS.  LABS REVIEWED PREVIOUS:   12-18-18: hgb a1c 7.8   01-24-19: urine micro-albumin: 5.3 04-23-19: wbc 9.6; hgb 13.0; hct 41.4; mcv 90.6 plt 280; glucose 187; bun 37 creat 1.36; k+ 3.3; na++ 139; ca 9.3 ;liver normal albumin 3.2 05-01-19: k+ 4.3 05-09-19: k+ 3.3; vit B 12: 480 05-13-19: k+ 3.8 05-31-19: glucose 287; bun 27; creat 1.39; k+ 4.4; na++ 138; ca 9.4 07-04-30: wbc 8.9; hgb 10.7; hct 33.9; mcv 92.1 plt 266; glucose 118; bun 33; creat 1.23; k+ 3.2; na++ 137; ca 9.2 chol 129; ldl 82 trig 125; hdl 22 07-11-19: k+ 3.3 07-16-19: glucose 112; bun 26; crat 1.15; k+ 4.2; na++ 139; ca 9.3   11-20-19: wbc 8.1; hgb 12.0; hct 39.0; mcv 93.1 plt 227; glucose 92; bun 27; creat 1.07; k+ 4.5; na++ 139; ca 9.3 liver normal albumin 3.2   NO NEW LABS.   Review of Systems  Unable to perform ROS: Dementia (unable to participate )    Physical Exam Constitutional:      General: She is not in acute distress.    Appearance: She is well-developed. She is not diaphoretic.  Neck:     Thyroid: No thyromegaly.  Cardiovascular:     Rate and Rhythm: Normal rate and regular rhythm.     Pulses: Normal pulses.     Heart sounds: Normal heart sounds.  Pulmonary:     Effort: Pulmonary effort is normal. No respiratory distress.     Breath sounds: Normal breath sounds.  Chest:     Comments: Right breast mass;large and painful to touch Abdominal:     General: Bowel sounds are normal. There is no distension.     Palpations: Abdomen is soft.     Tenderness: There is no abdominal tenderness.  Musculoskeletal:     Cervical back: Neck supple.     Right lower leg: No edema.     Left  lower leg: No edema.     Comments: Able to move all extremities   Lymphadenopathy:     Cervical: No cervical adenopathy.  Skin:    General: Skin is warm and dry.  Neurological:     Mental Status: She is alert. Mental status is at baseline.  Psychiatric:        Mood and Affect: Mood normal.        ASSESSMENT/ PLAN:  TODAY:   1. Hypokalemia: is stable k+ 4.5 will continue k+ 20 meq twice daily   2. GERD without esophagitis: is stable will continue prilosec 40 mg daily   3. Dementia with behavioral disturbance unspecified dementia type: is without change in status; is slowly losing weight: weight is 174 pounds; will continue namenda 10 mg twice daily her weight loss is expected at the lat stages of this disease process   PREVIOUS   4. Right breast cancer: is without change in status; family has decided against mastectomy; is slowly losing weight pain is managed  Will continue ulram 75 mg three times daily   5. 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 Major depression current chronic: is stable is off medications will monitor   7. Type 2 diabetes mellitus uncontrolled with peripheral circulatory disorder: hgb a1c 7.1 will continue junamet 50/500 mg twice daily basaglar 40 units and novolog 17 units meals.   8. Dyslipidemia associated with type 2 diabetes mellitus: is stable LDL 125 will monitor   9. CKD stage 2 due to type 2 diabetes  mellitus is stable bun 26; creat 1.15   10. Hypertension associated with stage 2 chronic kidney disease due to type 2 diabetes mellitus: is stable b/p 104/64 will continue lopressor 50 mg twice daily   11. Chronic atrial fibrillation: is stable will continue digoxin 0.125 mg daily lopressor 50 mg twice daily for rate control is on long term eliquis 5 mg twice daily   12. Bilateral lower extremity edema: is stable will continue lasix 20 mg daily and aldactone 12.5 mg daily      MD is aware of resident's narcotic use and is in agreement with current plan of care. We will attempt to wean resident as appropriate.  Ok Edwards NP [facility]  Contact [phone] Monday through Friday 8am- 5pm  After hours call [phone]

## 2019-12-18 DIAGNOSIS — F4323 Adjustment disorder with mixed anxiety and depressed mood: Secondary | ICD-10-CM | POA: Diagnosis not present

## 2019-12-19 DIAGNOSIS — E1140 Type 2 diabetes mellitus with diabetic neuropathy, unspecified: Secondary | ICD-10-CM | POA: Diagnosis not present

## 2019-12-19 DIAGNOSIS — I5042 Chronic combined systolic (congestive) and diastolic (congestive) heart failure: Secondary | ICD-10-CM | POA: Diagnosis not present

## 2019-12-19 DIAGNOSIS — Z1159 Encounter for screening for other viral diseases: Secondary | ICD-10-CM | POA: Diagnosis not present

## 2019-12-23 DIAGNOSIS — Z1159 Encounter for screening for other viral diseases: Secondary | ICD-10-CM | POA: Diagnosis not present

## 2019-12-23 DIAGNOSIS — E1140 Type 2 diabetes mellitus with diabetic neuropathy, unspecified: Secondary | ICD-10-CM | POA: Diagnosis not present

## 2019-12-23 DIAGNOSIS — I5042 Chronic combined systolic (congestive) and diastolic (congestive) heart failure: Secondary | ICD-10-CM | POA: Diagnosis not present

## 2019-12-25 DIAGNOSIS — F4323 Adjustment disorder with mixed anxiety and depressed mood: Secondary | ICD-10-CM | POA: Diagnosis not present

## 2019-12-27 ENCOUNTER — Other Ambulatory Visit: Payer: Self-pay | Admitting: Adult Health

## 2019-12-27 ENCOUNTER — Non-Acute Institutional Stay (SKILLED_NURSING_FACILITY): Payer: Medicare Other | Admitting: Adult Health

## 2019-12-27 ENCOUNTER — Encounter: Payer: Self-pay | Admitting: Adult Health

## 2019-12-27 DIAGNOSIS — E1151 Type 2 diabetes mellitus with diabetic peripheral angiopathy without gangrene: Secondary | ICD-10-CM | POA: Diagnosis not present

## 2019-12-27 DIAGNOSIS — C50911 Malignant neoplasm of unspecified site of right female breast: Secondary | ICD-10-CM

## 2019-12-27 DIAGNOSIS — M2142 Flat foot [pes planus] (acquired), left foot: Secondary | ICD-10-CM | POA: Diagnosis not present

## 2019-12-27 DIAGNOSIS — B351 Tinea unguium: Secondary | ICD-10-CM | POA: Diagnosis not present

## 2019-12-27 DIAGNOSIS — Z794 Long term (current) use of insulin: Secondary | ICD-10-CM | POA: Diagnosis not present

## 2019-12-27 DIAGNOSIS — L603 Nail dystrophy: Secondary | ICD-10-CM | POA: Diagnosis not present

## 2019-12-27 DIAGNOSIS — M2141 Flat foot [pes planus] (acquired), right foot: Secondary | ICD-10-CM | POA: Diagnosis not present

## 2019-12-27 MED ORDER — TRAMADOL HCL 50 MG PO TABS: 50.0000 mg | ORAL_TABLET | Freq: Three times a day (TID) | ORAL | 0 refills | Status: DC

## 2019-12-30 DIAGNOSIS — Z1159 Encounter for screening for other viral diseases: Secondary | ICD-10-CM | POA: Diagnosis not present

## 2019-12-30 DIAGNOSIS — I5042 Chronic combined systolic (congestive) and diastolic (congestive) heart failure: Secondary | ICD-10-CM | POA: Diagnosis not present

## 2019-12-30 DIAGNOSIS — E1140 Type 2 diabetes mellitus with diabetic neuropathy, unspecified: Secondary | ICD-10-CM | POA: Diagnosis not present

## 2020-01-01 DIAGNOSIS — F4323 Adjustment disorder with mixed anxiety and depressed mood: Secondary | ICD-10-CM | POA: Diagnosis not present

## 2020-01-01 NOTE — Progress Notes (Signed)
Location:   [facility] Room Number: 113/W Place of Service:  SNF (31)   CODE STATUS: dnr  Allergies  Allergen Reactions  . Codeine Sulfate [Codeine] Anaphylaxis and Hives  . Morphine And Related Anaphylaxis and Hives  . Penicillins Anaphylaxis  . Ace Inhibitors Cough    Chief Complaint  Patient presents with  . Acute Visit    medication management     HPI:  She is presently taking ultram 75 mg every 8 hours for pain management. There are no reports of uncontrolled pain present. There are no nonverbal indicators of pain present. She does engage with others. There is tenderness/pain with palpitation to the breast. Her family has decided against surgery at this time.   Past Medical History:  Diagnosis Date  . Allergy   . Atrial fibrillation ([place]) 08/2011   First diagnosed in 08/2011; duration of arrhythmia is uncertain  . Bilateral lower extremity edema   . Chronic diarrhea    diverticulosis  . COPD (chronic obstructive pulmonary disease) ([place])   . Dementia ([place])   . Diabetes mellitus, type 2 (HCC)    Diabetic neuropathy  . Dyspnea on exertion    pedal edema  . Gout   . Headache(784.0)    twice weekly  . Hyperlipidemia   . Hypertension   . Osteopenia    DEXA scan 01/2010  . Palpitations   . Seasonal allergies   . Stress incontinence   . Vertigo     Past Surgical History:  Procedure Laterality Date  . APPENDECTOMY    . BREAST BIOPSY  2002   Right  . CESAREAN SECTION     x 2  . CHOLECYSTECTOMY    . COLONOSCOPY  2007   Negative screening study  . COLONOSCOPY WITH PROPOFOL N/A 09/18/2017   Procedure: COLONOSCOPY WITH PROPOFOL;  Surgeon: Daneil Dolin, MD;  [facility];  Service: Endoscopy;  Laterality: N/A;  11:00am  . HAMMER TOE SURGERY     Bilateral hammer toe amputation  . INCISIONAL HERNIA REPAIR    . KNEE ARTHROSCOPY W/ MENISCAL REPAIR  2007   Bilateral  . POLYPECTOMY  09/18/2017   Procedure: POLYPECTOMY;  Surgeon: Daneil Dolin, MD;  [facility];  Service: Endoscopy;;  colon  . UMBILICAL HERNIA REPAIR      Social History   Socioeconomic History  . Marital status: Widowed    Spouse name: Not on file  . Number of children: 2  . Years of education: Not on file  . Highest education level: Not on file  Occupational History  . Occupation: Engineer, materials  Tobacco Use  . Smoking status: Never Smoker  . Smokeless tobacco: Never Used  Vaping Use  . Vaping Use: Never used  Substance and Sexual Activity  . Alcohol use: No  . Drug use: No  . Sexual activity: Not Currently  Other Topics Concern  . Not on file  Social History Narrative  . Not on file   Social Determinants of Health   Financial Resource Strain: Low Risk   . Difficulty of Paying Living Expenses: Not hard at all  Food Insecurity: No Food Insecurity  . Worried About Charity fundraiser in the Last Year: Never true  . Ran Out of Food in the Last Year: Never true  Transportation Needs: No Transportation Needs  . Lack of Transportation (Medical): No  . Lack of Transportation (Non-Medical): No  Physical Activity: Inactive  . Days of  Exercise per Week: 0 days  . Minutes of Exercise per Session: 0 min  Stress:   . Feeling of Stress : Not on file  Social Connections: Socially Isolated  . Frequency of Communication with Friends and Family: Never  . Frequency of Social Gatherings with Friends and Family: Never  . Attends Religious Services: Never  . Active Member of Clubs or Organizations: Yes  . Attends Archivist Meetings: Never  . Marital Status: Widowed  Intimate Partner Violence: Not At Risk  . Fear of Current or Ex-Partner: No  . Emotionally Abused: No  . Physically Abused: No  . Sexually Abused: No   Family History  Adopted: Yes      VITAL SIGNS BP 109/81   Pulse 74   Temp 98.2 F (36.8 C) (Oral)   Resp 20   Ht 5\' 6"  (1.676 m)   Wt 174 lb (78.9 kg)   BMI 28.08 kg/m   Outpatient Encounter  Medications as of 12/27/2019  Medication Sig  . acetaminophen (TYLENOL) 325 MG tablet Take 650 mg by mouth every 6 (six) hours as needed.   Marland Kitchen apixaban (ELIQUIS) 5 MG TABS tablet Take 5 mg by mouth 2 (two) times daily.   . carboxymethylcellulose (REFRESH PLUS) 0.5 % SOLN Place 1 drop into both eyes 4 (four) times daily.  . digoxin (LANOXIN) 0.125 MG tablet Take 1 tablet by mouth once a day (HOLD FOR AP UNDER 60)  . furosemide (LASIX) 20 MG tablet Take 20 mg by mouth daily.  Marland Kitchen guaiFENesin-dextromethorphan (ROBITUSSIN DM) 100-10 MG/5ML syrup Take 15 mLs by mouth every 6 (six) hours as needed for cough. For cough/congestion. May give up to 48 hrs. Inform physician immediately if cough or congestion is associated with fever or SOB  . insulin aspart (NOVOLOG) 100 UNIT/ML injection Inject 17 Units into the skin 3 (three) times daily with meals. If BS above 150  . LANTUS SOLOSTAR 100 UNIT/ML Solostar Pen Inject 40 Units into the skin at bedtime.   . memantine (NAMENDA) 10 MG tablet Take 10 mg by mouth 2 (two) times daily.  . metolazone (ZAROXOLYN) 5 MG tablet Take 5 mg by mouth daily. On Monday and Friday   . metoprolol tartrate (LOPRESSOR) 50 MG tablet Take 50 mg by mouth 2 (two) times daily.  . NON FORMULARY Diet Type:  NAS, consistent CHO, Dysphagia 3 with thin liquids  . NON FORMULARY Give Cranberry Juice twice a day  . omeprazole (PRILOSEC) 40 MG capsule Take 40 mg by mouth daily.  . polyethylene glycol (MIRALAX / GLYCOLAX) packet Take 17 g by mouth daily as needed. daily prn for constipation - mix with 6 oz of liquid and drink  . potassium chloride SA (KLOR-CON) 20 MEQ tablet Take 40 mEq by mouth 4 (four) times daily.   . sitaGLIPtin-metformin (JANUMET) 50-500 MG tablet Take 1 tablet by mouth 2 (two) times daily with a meal.  . spironolactone (ALDACTONE) 25 MG tablet Take 12.5 mg by mouth daily. 1/2 Tablet = 12.5mg   . traMADol (ULTRAM) 50 MG tablet Take 1 tablet (50 mg total) by mouth 3 (three)  times daily. Special Instructions: hold if asleep/don't wake up pt   No facility-administered encounter medications on file as of 12/27/2019.     SIGNIFICANT DIAGNOSTIC EXAMS  PREVIOUS  02-21-19: DEXA: t score -3.504  NO NEW EXAMS.    LABS REVIEWED PREVIOUS:   12-18-18: hgb a1c 7.8   01-24-19: urine micro-albumin: 5.3 04-23-19: wbc 9.6; hgb 13.0; hct 41.4;  mcv 90.6 plt 280; glucose 187; bun 37 creat 1.36; k+ 3.3; na++ 139; ca 9.3 ;liver normal albumin 3.2 05-01-19: k+ 4.3 05-09-19: k+ 3.3; vit B 12: 480 05-13-19: k+ 3.8 05-31-19: glucose 287; bun 27; creat 1.39; k+ 4.4; na++ 138; ca 9.4 07-04-30: wbc 8.9; hgb 10.7; hct 33.9; mcv 92.1 plt 266; glucose 118; bun 33; creat 1.23; k+ 3.2; na++ 137; ca 9.2 chol 129; ldl 82 trig 125; hdl 22 07-11-19: k+ 3.3 07-16-19: glucose 112; bun 26; crat 1.15; k+ 4.2; na++ 139; ca 9.3   11-20-19: wbc 8.1; hgb 12.0; hct 39.0; mcv 93.1 plt 227; glucose 92; bun 27; creat 1.07; k+ 4.5; na++ 139; ca 9.3 liver normal albumin 3.2   NO NEW LABS.   Review of Systems  Unable to perform ROS: Dementia (unable to participate )    Physical Exam Constitutional:      General: She is not in acute distress.    Appearance: She is well-developed. She is not diaphoretic.  Neck:     Thyroid: No thyromegaly.  Cardiovascular:     Rate and Rhythm: Normal rate and regular rhythm.     Pulses: Normal pulses.     Heart sounds: Normal heart sounds.  Pulmonary:     Effort: Pulmonary effort is normal. No respiratory distress.     Breath sounds: Normal breath sounds.  Chest:     Comments: Right breast mass;large and painful to touch Abdominal:     General: Bowel sounds are normal. There is no distension.     Palpations: Abdomen is soft.     Tenderness: There is no abdominal tenderness.  Musculoskeletal:     Cervical back: Neck supple.     Right lower leg: No edema.     Left lower leg: No edema.     Comments: Is able to move all extremities   Lymphadenopathy:      Cervical: No cervical adenopathy.  Skin:    General: Skin is warm and dry.  Neurological:     Mental Status: She is alert. Mental status is at baseline.  Psychiatric:        Mood and Affect: Mood normal.        ASSESSMENT/ PLAN:  TODAY  1. 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     MD is aware of resident's narcotic use and is in agreement with current plan of care. We will attempt to wean resident as appropriate.  Ok Edwards NP [facility]  Contact [phone] Monday through Friday 8am- 5pm  After hours call [phone]

## 2020-01-02 DIAGNOSIS — E1140 Type 2 diabetes mellitus with diabetic neuropathy, unspecified: Secondary | ICD-10-CM | POA: Diagnosis not present

## 2020-01-02 DIAGNOSIS — I5042 Chronic combined systolic (congestive) and diastolic (congestive) heart failure: Secondary | ICD-10-CM | POA: Diagnosis not present

## 2020-01-02 DIAGNOSIS — Z1159 Encounter for screening for other viral diseases: Secondary | ICD-10-CM | POA: Diagnosis not present

## 2020-01-06 DIAGNOSIS — I5042 Chronic combined systolic (congestive) and diastolic (congestive) heart failure: Secondary | ICD-10-CM | POA: Diagnosis not present

## 2020-01-06 DIAGNOSIS — Z1159 Encounter for screening for other viral diseases: Secondary | ICD-10-CM | POA: Diagnosis not present

## 2020-01-06 DIAGNOSIS — E1140 Type 2 diabetes mellitus with diabetic neuropathy, unspecified: Secondary | ICD-10-CM | POA: Diagnosis not present

## 2020-01-08 DIAGNOSIS — F331 Major depressive disorder, recurrent, moderate: Secondary | ICD-10-CM | POA: Diagnosis not present

## 2020-01-09 ENCOUNTER — Encounter: Payer: Self-pay | Admitting: Adult Health

## 2020-01-09 ENCOUNTER — Non-Acute Institutional Stay (SKILLED_NURSING_FACILITY): Payer: Medicare Other | Admitting: Adult Health

## 2020-01-09 DIAGNOSIS — I482 Chronic atrial fibrillation, unspecified: Secondary | ICD-10-CM | POA: Diagnosis not present

## 2020-01-09 DIAGNOSIS — R627 Adult failure to thrive: Secondary | ICD-10-CM | POA: Diagnosis not present

## 2020-01-09 DIAGNOSIS — Z1159 Encounter for screening for other viral diseases: Secondary | ICD-10-CM | POA: Diagnosis not present

## 2020-01-09 DIAGNOSIS — F0391 Unspecified dementia with behavioral disturbance: Secondary | ICD-10-CM

## 2020-01-09 DIAGNOSIS — E1140 Type 2 diabetes mellitus with diabetic neuropathy, unspecified: Secondary | ICD-10-CM | POA: Diagnosis not present

## 2020-01-09 DIAGNOSIS — I5042 Chronic combined systolic (congestive) and diastolic (congestive) heart failure: Secondary | ICD-10-CM | POA: Diagnosis not present

## 2020-01-09 NOTE — Progress Notes (Signed)
Location:    [facility] Room Number: 113/W Place of Service:  SNF (31)   CODE STATUS: DNR  Allergies  Allergen Reactions   Codeine Sulfate [Codeine] Anaphylaxis and Hives   Morphine And Related Anaphylaxis and Hives   Penicillins Anaphylaxis   Ace Inhibitors Cough    Chief Complaint  Patient presents with   Acute Visit    Care Plan Meeting    HPI:  We have come together for her care plan meeting. Unable to do BIMS; mood 3/30. She has lost 8 pounds over the past 6 months. No reports of falls; she is total care with adls is incontinent of bladder and bowel. There are no reports of pain present. There are no reports of anxiety or agitation. 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  Chronic atrial fibrillation  Failure to thrive in adult  Past Medical History:  Diagnosis Date   Allergy    Atrial fibrillation ([place]) 08/2011   First diagnosed in 08/2011; duration of arrhythmia is uncertain   Bilateral lower extremity edema    Chronic diarrhea    diverticulosis   COPD (chronic obstructive pulmonary disease) (H[facility])    Dementia ([facility])    Diabetes mellitus, type 2 ([place])    Diabetic neuropathy   Dyspnea on exertion    pedal edema   Gout    Headache(784.0)    twice weekly   Hyperlipidemia    Hypertension    Osteopenia    DEXA scan 01/2010   Palpitations    Seasonal allergies    Stress incontinence    Vertigo     Past Surgical History:  Procedure Laterality Date   APPENDECTOMY     BREAST BIOPSY  2002   Right   CESAREAN SECTION     x 2   CHOLECYSTECTOMY     COLONOSCOPY  2007   Negative screening study   COLONOSCOPY WITH PROPOFOL N/A 09/18/2017   Procedure: COLONOSCOPY WITH PROPOFOL;  Surgeon: Daneil Dolin, MD;  [facility];  Service: Endoscopy;  Laterality: N/A;  11:00am   HAMMER TOE SURGERY     Bilateral hammer toe  amputation   INCISIONAL HERNIA REPAIR     KNEE ARTHROSCOPY W/ MENISCAL REPAIR  2007   Bilateral   POLYPECTOMY  09/18/2017   Procedure: POLYPECTOMY;  Surgeon: Daneil Dolin, MD;  [facility];  Service: Endoscopy;;  colon   UMBILICAL HERNIA REPAIR      Social History   Socioeconomic History   Marital status: Widowed    Spouse name: Not on file   Number of children: 2   Years of education: Not on file   Highest education level: Not on file  Occupational History   Occupation: Engineer, materials  Tobacco Use   Smoking status: Never Smoker   Smokeless tobacco: Never Used  Scientific laboratory technician Use: Never used  Substance and Sexual Activity   Alcohol use: No   Drug use: No   Sexual activity: Not Currently  Other Topics Concern   Not on file  Social History Narrative   Not on file   Social Determinants of Health   Financial Resource Strain: Low Risk    Difficulty of Paying Living Expenses: Not hard at all  Food Insecurity: No Food Insecurity   Worried About Charity fundraiser in the Last Year: Never true   [place] in the  Last Year: Never true  Transportation Needs: No Transportation Needs   Lack of Transportation (Medical): No   Lack of Transportation (Non-Medical): No  Physical Activity: Inactive   Days of Exercise per Week: 0 days   Minutes of Exercise per Session: 0 min  Stress:    Feeling of Stress : Not on file  Social Connections: Socially Isolated   Frequency of Communication with Friends and Family: Never   Frequency of Social Gatherings with Friends and Family: Never   Attends Religious Services: Never   Marine scientist or Organizations: Yes   Attends Archivist Meetings: Never   Marital Status: Widowed  Human resources officer Violence: Not At Risk   Fear of Current or Ex-Partner: No   Emotionally Abused: No   Physically Abused: No   Sexually Abused: No   Family History  Adopted: Yes       VITAL SIGNS BP (!) 136/53    Pulse 70    Temp 98.1 F (36.7 C) (Oral)    Resp 20    Ht 5\' 6"  (1.676 m)    Wt 172 lb (78 kg)    BMI 27.76 kg/m   Outpatient Encounter Medications as of 01/09/2020  Medication Sig   acetaminophen (TYLENOL) 325 MG tablet Take 650 mg by mouth every 6 (six) hours.    apixaban (ELIQUIS) 5 MG TABS tablet Take 5 mg by mouth 2 (two) times daily.    carboxymethylcellulose (REFRESH PLUS) 0.5 % SOLN Place 1 drop into both eyes 4 (four) times daily.   digoxin (LANOXIN) 0.125 MG tablet Take 1 tablet by mouth once a day (HOLD FOR AP UNDER 60)   furosemide (LASIX) 20 MG tablet Take 20 mg by mouth daily.   guaiFENesin-dextromethorphan (ROBITUSSIN DM) 100-10 MG/5ML syrup Take 15 mLs by mouth every 6 (six) hours as needed for cough. For cough/congestion. May give up to 48 hrs. Inform physician immediately if cough or congestion is associated with fever or SOB   insulin aspart (NOVOLOG) 100 UNIT/ML injection Inject 17 Units into the skin 3 (three) times daily with meals. If BS above 150   LANTUS SOLOSTAR 100 UNIT/ML Solostar Pen Inject 40 Units into the skin at bedtime.    memantine (NAMENDA) 10 MG tablet Take 10 mg by mouth 2 (two) times daily.   metolazone (ZAROXOLYN) 5 MG tablet Take 5 mg by mouth daily. On Monday and Friday    metoprolol tartrate (LOPRESSOR) 50 MG tablet Take 50 mg by mouth 2 (two) times daily.   NON FORMULARY Diet Type:  NAS, consistent CHO, Dysphagia 3 with thin liquids   NON FORMULARY Give Cranberry Juice twice a day   omeprazole (PRILOSEC) 40 MG capsule Take 40 mg by mouth daily.   polyethylene glycol (MIRALAX / GLYCOLAX) packet Take 17 g by mouth daily as needed. daily prn for constipation - mix with 6 oz of liquid and drink   potassium chloride SA (KLOR-CON) 20 MEQ tablet Take 40 mEq by mouth 4 (four) times daily.    sitaGLIPtin-metformin (JANUMET) 50-500 MG tablet Take 1 tablet by mouth 2 (two) times daily with a meal.    spironolactone (ALDACTONE) 25 MG tablet Take 12.5 mg by mouth daily. 1/2 Tablet = 12.5mg    traMADol (ULTRAM) 50 MG tablet Take 1 tablet (50 mg total) by mouth 3 (three) times daily. Special Instructions: hold if asleep/don't wake up pt   No facility-administered encounter medications on file as of 01/09/2020.     SIGNIFICANT DIAGNOSTIC EXAMS  PREVIOUS  02-21-19: DEXA: t score -3.504  NO NEW EXAMS.    LABS REVIEWED PREVIOUS:   12-18-18: hgb a1c 7.8   01-24-19: urine micro-albumin: 5.3 04-23-19: wbc 9.6; hgb 13.0; hct 41.4; mcv 90.6 plt 280; glucose 187; bun 37 creat 1.36; k+ 3.3; na++ 139; ca 9.3 ;liver normal albumin 3.2 05-01-19: k+ 4.3 05-09-19: k+ 3.3; vit B 12: 480 05-13-19: k+ 3.8 05-31-19: glucose 287; bun 27; creat 1.39; k+ 4.4; na++ 138; ca 9.4 07-04-30: wbc 8.9; hgb 10.7; hct 33.9; mcv 92.1 plt 266; glucose 118; bun 33; creat 1.23; k+ 3.2; na++ 137; ca 9.2 chol 129; ldl 82 trig 125; hdl 22 07-11-19: k+ 3.3 07-16-19: glucose 112; bun 26; crat 1.15; k+ 4.2; na++ 139; ca 9.3   11-20-19: wbc 8.1; hgb 12.0; hct 39.0; mcv 93.1 plt 227; glucose 92; bun 27; creat 1.07; k+ 4.5; na++ 139; ca 9.3 liver normal albumin 3.2   NO NEW LABS.   Review of Systems  Unable to perform ROS: Dementia (unable to participate )    Physical Exam Constitutional:      General: She is not in acute distress.    Appearance: She is well-developed. She is not diaphoretic.  Neck:     Thyroid: No thyromegaly.  Cardiovascular:     Rate and Rhythm: Normal rate and regular rhythm.     Heart sounds: Normal heart sounds.  Pulmonary:     Effort: Pulmonary effort is normal. No respiratory distress.     Breath sounds: Normal breath sounds.  Chest:     Comments: Right breast mass;large and painful to touch Abdominal:     General: Bowel sounds are normal. There is no distension.     Palpations: Abdomen is soft.     Tenderness: There is no abdominal tenderness.  Musculoskeletal:     Right lower leg: No edema.       Left lower leg: No edema.     Comments: Is able to move all extremities    Lymphadenopathy:     Cervical: No cervical adenopathy.  Skin:    General: Skin is warm and dry.  Neurological:     Mental Status: She is alert. Mental status is at baseline.  Psychiatric:        Mood and Affect: Mood normal.        ASSESSMENT/ PLAN:  TODAY  1.  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 Chronic atrial fibrillation 3. Failure to thrive in adult  Will continue current medications Will continue current plan of care Will continue to monitor her status.    MD is aware of resident's narcotic use and is in agreement with current plan of care. We will attempt to wean resident as appropriate.  Ok Edwards NP [facility]  Contact [phone] Monday through Friday 8am- 5pm  After hours call [phone]

## 2020-01-13 DIAGNOSIS — Z1159 Encounter for screening for other viral diseases: Secondary | ICD-10-CM | POA: Diagnosis not present

## 2020-01-13 DIAGNOSIS — I5042 Chronic combined systolic (congestive) and diastolic (congestive) heart failure: Secondary | ICD-10-CM | POA: Diagnosis not present

## 2020-01-13 DIAGNOSIS — E1140 Type 2 diabetes mellitus with diabetic neuropathy, unspecified: Secondary | ICD-10-CM | POA: Diagnosis not present

## 2020-01-15 DIAGNOSIS — F331 Major depressive disorder, recurrent, moderate: Secondary | ICD-10-CM | POA: Diagnosis not present

## 2020-01-16 DIAGNOSIS — E1140 Type 2 diabetes mellitus with diabetic neuropathy, unspecified: Secondary | ICD-10-CM | POA: Diagnosis not present

## 2020-01-16 DIAGNOSIS — I5042 Chronic combined systolic (congestive) and diastolic (congestive) heart failure: Secondary | ICD-10-CM | POA: Diagnosis not present

## 2020-01-16 DIAGNOSIS — Z1159 Encounter for screening for other viral diseases: Secondary | ICD-10-CM | POA: Diagnosis not present

## 2020-01-17 DIAGNOSIS — G301 Alzheimer's disease with late onset: Secondary | ICD-10-CM | POA: Diagnosis not present

## 2020-01-17 DIAGNOSIS — F331 Major depressive disorder, recurrent, moderate: Secondary | ICD-10-CM | POA: Diagnosis not present

## 2020-01-20 DIAGNOSIS — E1140 Type 2 diabetes mellitus with diabetic neuropathy, unspecified: Secondary | ICD-10-CM | POA: Diagnosis not present

## 2020-01-20 DIAGNOSIS — Z1159 Encounter for screening for other viral diseases: Secondary | ICD-10-CM | POA: Diagnosis not present

## 2020-01-20 DIAGNOSIS — I5042 Chronic combined systolic (congestive) and diastolic (congestive) heart failure: Secondary | ICD-10-CM | POA: Diagnosis not present

## 2020-01-22 ENCOUNTER — Encounter: Payer: Self-pay | Admitting: Adult Health

## 2020-01-22 ENCOUNTER — Non-Acute Institutional Stay (SKILLED_NURSING_FACILITY): Payer: Medicare Other | Admitting: Adult Health

## 2020-01-22 DIAGNOSIS — K219 Gastro-esophageal reflux disease without esophagitis: Secondary | ICD-10-CM | POA: Diagnosis not present

## 2020-01-22 DIAGNOSIS — I129 Hypertensive chronic kidney disease with stage 1 through stage 4 chronic kidney disease, or unspecified chronic kidney disease: Secondary | ICD-10-CM | POA: Diagnosis not present

## 2020-01-22 DIAGNOSIS — E1169 Type 2 diabetes mellitus with other specified complication: Secondary | ICD-10-CM

## 2020-01-22 DIAGNOSIS — E1151 Type 2 diabetes mellitus with diabetic peripheral angiopathy without gangrene: Secondary | ICD-10-CM

## 2020-01-22 DIAGNOSIS — F0391 Unspecified dementia with behavioral disturbance: Secondary | ICD-10-CM

## 2020-01-22 DIAGNOSIS — C50911 Malignant neoplasm of unspecified site of right female breast: Secondary | ICD-10-CM | POA: Diagnosis not present

## 2020-01-22 DIAGNOSIS — IMO0002 Reserved for concepts with insufficient information to code with codable children: Secondary | ICD-10-CM

## 2020-01-22 DIAGNOSIS — E1122 Type 2 diabetes mellitus with diabetic chronic kidney disease: Secondary | ICD-10-CM | POA: Diagnosis not present

## 2020-01-22 DIAGNOSIS — E785 Hyperlipidemia, unspecified: Secondary | ICD-10-CM | POA: Diagnosis not present

## 2020-01-22 DIAGNOSIS — I482 Chronic atrial fibrillation, unspecified: Secondary | ICD-10-CM | POA: Diagnosis not present

## 2020-01-22 DIAGNOSIS — N182 Chronic kidney disease, stage 2 (mild): Secondary | ICD-10-CM | POA: Diagnosis not present

## 2020-01-22 DIAGNOSIS — E1165 Type 2 diabetes mellitus with hyperglycemia: Secondary | ICD-10-CM

## 2020-01-22 DIAGNOSIS — F331 Major depressive disorder, recurrent, moderate: Secondary | ICD-10-CM | POA: Diagnosis not present

## 2020-01-22 NOTE — Progress Notes (Signed)
Provider: Gerlene Fee, NP Location   [place]  PCP: Gerlene Fee, NP   Extended Emergency Contact Information Primary Emergency Contact: [facility] Address: [address]          [place], [place] [address] [name] of [place] Phone: [phone] Mobile Phone: [phone] Relation: Daughter  Codes status: DNR Goals of care: advanced directive information Advanced Directives 01/22/2020  Does Patient Have a Medical Advance Directive? Yes  Type of Advance Directive Out of facility DNR (pink MOST or yellow form)  Does patient want to make changes to medical advance directive? No - Patient declined  Copy of [place] in Chart? -  Pre-existing out of facility DNR order (yellow form or pink MOST form) Yellow form placed in chart (order not valid for inpatient use)     Allergies  Allergen Reactions  . Codeine Sulfate [Codeine] Anaphylaxis and Hives  . Morphine And Related Anaphylaxis and Hives  . Penicillins Anaphylaxis  . Ace Inhibitors Cough    Chief Complaint  Patient presents with  . Annual Exam    Annual Exam    HPI  She is a 79 year old long term resident of this facility being seen for her annual exam. She has not been hospitalized over the past year. There are no reports of recent falls. She has right breast cancer which is progressing. There are no indications of pain present. Her family has decided not to proceed with mastectomy. In the past year she has lost about 10 pounds. There are no reports of agitation; no reports of constipation no reports of insomnia. 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 Allergy   . Atrial fibrillation ([place]) 08/2011   First diagnosed in 08/2011; duration of arrhythmia is uncertain  . Bilateral lower  extremity edema   . Chronic diarrhea    diverticulosis  . COPD (chronic obstructive pulmonary disease) ([place])   . Dementia ([place])   . Diabetes mellitus, type 2 (HCC)    Diabetic neuropathy  . Dyspnea on exertion    pedal edema  . Gout   . Headache(784.0)    twice weekly  . Hyperlipidemia   . Hypertension   . Osteopenia    DEXA scan 01/2010  . Palpitations   . Seasonal allergies   . Stress incontinence   . Vertigo    Past Surgical History:  Procedure Laterality Date  . APPENDECTOMY    . BREAST BIOPSY  2002   Right  . CESAREAN SECTION     x 2  . CHOLECYSTECTOMY    . COLONOSCOPY  2007   Negative screening study  . COLONOSCOPY WITH PROPOFOL N/A 09/18/2017   Procedure: COLONOSCOPY WITH PROPOFOL;  Surgeon: Daneil Dolin, MD;  [facility];  Service: Endoscopy;  Laterality: N/A;  11:00am  . HAMMER TOE SURGERY     Bilateral hammer toe amputation  . INCISIONAL HERNIA REPAIR    . KNEE ARTHROSCOPY W/ MENISCAL REPAIR  2007   Bilateral  . POLYPECTOMY  09/18/2017   Procedure: POLYPECTOMY;  Surgeon: Daneil Dolin, MD;  [facility];  Service: Endoscopy;;  colon  . UMBILICAL HERNIA REPAIR      reports that she has never smoked. She has never used smokeless tobacco. She reports that she does not  drink alcohol and does not use drugs. Social History   Tobacco Use  . Smoking status: Never Smoker  . Smokeless tobacco: Never Used  Vaping Use  . Vaping Use: Never used  Substance Use Topics  . Alcohol use: No  . Drug use: No   Family History  Adopted: Yes    Pertinent  Health Maintenance Due  Topic Date Due  . INFLUENZA VACCINE  12/01/2019  . URINE MICROALBUMIN  01/24/2020  . FOOT EXAM  02/22/2020  . OPHTHALMOLOGY EXAM  03/10/2020  . HEMOGLOBIN A1C  06/10/2020  . DEXA SCAN  Completed  . PNA vac Low Risk Adult  Completed   Fall Risk  07/30/2019 12/17/2018 12/17/2018 01/30/2018 01/19/2017  Falls in the past year? 1 1 1  No No  Number falls in past yr: 0 1 1  - -  Injury with Fall? 0 0 0 - -  Risk for fall due to : History of fall(s);Impaired balance/gait;Impaired mobility History of fall(s);Impaired balance/gait;Impaired mobility History of fall(s);Impaired balance/gait;Impaired mobility - -  Follow up - Falls evaluation completed Falls evaluation completed - -   Depression screen [facility] 2/9 12/17/2018 01/30/2018 01/19/2017 11/06/2015 09/29/2015  Decreased Interest 0 0 0 1 0  Down, Depressed, Hopeless 0 0 0 0 0  PHQ - 2 Score 0 0 0 1 0  Some recent data might be hidden        Outpatient Encounter Medications as of 01/22/2020  Medication Sig  . acetaminophen (TYLENOL) 325 MG tablet Take 650 mg by mouth every 6 (six) hours.   Marland Kitchen apixaban (ELIQUIS) 5 MG TABS tablet Take 5 mg by mouth 2 (two) times daily.   . carboxymethylcellulose (REFRESH PLUS) 0.5 % SOLN Place 1 drop into both eyes 4 (four) times daily.  . digoxin (LANOXIN) 0.125 MG tablet Take 1 tablet by mouth once a day (HOLD FOR AP UNDER 60)  . furosemide (LASIX) 20 MG tablet Take 20 mg by mouth daily.  Marland Kitchen guaiFENesin-dextromethorphan (ROBITUSSIN DM) 100-10 MG/5ML syrup Take 15 mLs by mouth every 6 (six) hours as needed for cough. For cough/congestion. May give up to 48 hrs. Inform physician immediately if cough or congestion is associated with fever or SOB  . insulin aspart (NOVOLOG) 100 UNIT/ML injection Inject 17 Units into the skin 3 (three) times daily with meals. If BS above 150  . LANTUS SOLOSTAR 100 UNIT/ML Solostar Pen Inject 40 Units into the skin at bedtime.   . memantine (NAMENDA) 10 MG tablet Take 10 mg by mouth 2 (two) times daily.  . metolazone (ZAROXOLYN) 5 MG tablet Take 5 mg by mouth daily. On Monday and Friday   . metoprolol tartrate (LOPRESSOR) 50 MG tablet Take 50 mg by mouth 2 (two) times daily.  . NON FORMULARY Diet Type:  NAS, consistent CHO, Dysphagia 3 with thin liquids  . NON FORMULARY Give Cranberry Juice twice a day  . omeprazole (PRILOSEC) 40 MG capsule Take 40 mg by  mouth daily.  . polyethylene glycol (MIRALAX / GLYCOLAX) packet Take 17 g by mouth daily as needed. daily prn for constipation - mix with 6 oz of liquid and drink  . potassium chloride SA (KLOR-CON) 20 MEQ tablet Take 40 mEq by mouth 4 (four) times daily.   . sitaGLIPtin-metformin (JANUMET) 50-500 MG tablet Take 1 tablet by mouth 2 (two) times daily with a meal.  . spironolactone (ALDACTONE) 25 MG tablet Take 12.5 mg by mouth daily. 1/2 Tablet = 12.5mg   . traMADol (ULTRAM) 50 MG  tablet Take 1 tablet (50 mg total) by mouth 3 (three) times daily. Special Instructions: hold if asleep/don't wake up pt   No facility-administered encounter medications on file as of 01/22/2020.     Vitals:   01/22/20 0912  BP: 139/66  Pulse: 62  Resp: 18  Temp: 98.2 F (36.8 C)  Weight: 172 lb (78 kg)  Height: 5\' 6"  (1.676 m)   Body mass index is 27.76 kg/m.  DIAGNOSTIC EXAMS   PREVIOUS  02-21-19: DEXA: t score -3.504  NO NEW EXAMS.    LABS REVIEWED PREVIOUS:   01-24-19: urine micro-albumin: 5.3 04-23-19: wbc 9.6; hgb 13.0; hct 41.4; mcv 90.6 plt 280; glucose 187; bun 37 creat 1.36; k+ 3.3; na++ 139; ca 9.3 ;liver normal albumin 3.2 05-01-19: k+ 4.3 05-09-19: k+ 3.3; vit B 12: 480 05-13-19: k+ 3.8 05-31-19: glucose 287; bun 27; creat 1.39; k+ 4.4; na++ 138; ca 9.4 07-04-30: wbc 8.9; hgb 10.7; hct 33.9; mcv 92.1 plt 266; glucose 118; bun 33; creat 1.23; k+ 3.2; na++ 137; ca 9.2 chol 129; ldl 82 trig 125; hdl 22 07-11-19: k+ 3.3 07-16-19: glucose 112; bun 26; crat 1.15; k+ 4.2; na++ 139; ca 9.3   11-20-19: wbc 8.1; hgb 12.0; hct 39.0; mcv 93.1 plt 227; glucose 92; bun 27; creat 1.07; k+ 4.5; na++ 139; ca 9.3 liver normal albumin 3.2   TODAY  12-09-19: hgb a1c 6.4     Review of Systems  Unable to perform ROS: Dementia (unable to participate )    Physical Exam Constitutional:      General: She is not in acute distress.    Appearance: She is well-developed. She is not diaphoretic.  HENT:     Nose:  Nose normal.     Mouth/Throat:     Mouth: Mucous membranes are moist.     Pharynx: Oropharynx is clear.  Eyes:     Conjunctiva/sclera: Conjunctivae normal.  Neck:     Thyroid: No thyromegaly.     Comments: Right breast mass;large and painful to touch; breast is discolored  Cardiovascular:     Rate and Rhythm: Normal rate and regular rhythm.     Pulses: Normal pulses.     Heart sounds: Normal heart sounds.  Pulmonary:     Effort: Pulmonary effort is normal. No respiratory distress.     Breath sounds: Normal breath sounds.  Abdominal:     General: Bowel sounds are normal. There is no distension.     Palpations: Abdomen is soft.     Tenderness: There is no abdominal tenderness.  Musculoskeletal:     Cervical back: Neck supple.     Right lower leg: No edema.     Left lower leg: No edema.     Comments: Is able to move all extremities   Lymphadenopathy:     Cervical: No cervical adenopathy.  Skin:    General: Skin is warm and dry.  Neurological:     Mental Status: She is alert. Mental status is at baseline.  Psychiatric:        Mood and Affect: Mood normal.      ASSESSMENT/ PLAN:  TODAY:   1. Right breast cancer she is slowly deteriorating her family has decided against mastectomy will continue tylenol 650 mg every 6 hours and ultram 50 mg three times daily   2. 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 Major depression recurrent chronic: is stable off medications  will monitor   4. Type 2 diabetes mellitus uncontrolled with peripheral circulatory disorder: hgb a1c 6.4 will continue janumet 50500 mg twice daily basaglar 40 units nightly and novolog 17 units with meals.   5. Dyslipidemia associated with type 2 diabetes mellitus is stable LDL 125 will monitor  6. CKD stage 2 due to type 2 diabetes mellitus is stable bun 26; creat 1.15  7. Hypertension associated with stage 2 chronic kidney  disease due to type 2 diabetes mellitus is stable b/p 139/66 will continue lopressor 50 mg twice daily   8. Chronic atrial fibrillation is stable will continue digoxin 0.125 mg daily lopressor 50 mg twice daily for rate control is on long term eliquis 5 mg twice daily   9. Bilateral lower extremity edema: is stable will continue lasix 20 mg daily and aldactone 12.5 mg daily  10. Hypokalemia is stable k+ 4.5 will lower k+ to 20 meq four times daily   11. GERD without esophagitis: is stable will continue prilosec 40 mg daily   12. Dementia with behavioral disturbance unspecified dementia type: is without change in status: is slowly losing weight; her weight is 172 pounds; will continue namenda 10 mg twice daily her weight is an expected but unfortunate outcome at the late stages of this disease process.     Ok Edwards NP [facility]  Contact [phone] Monday through Friday 8am- 5pm  After hours call [phone]

## 2020-01-24 ENCOUNTER — Other Ambulatory Visit: Payer: Self-pay | Admitting: Adult Health

## 2020-01-24 MED ORDER — TRAMADOL HCL 50 MG PO TABS: 50.0000 mg | ORAL_TABLET | Freq: Three times a day (TID) | ORAL | 0 refills | Status: DC

## 2020-01-27 ENCOUNTER — Encounter (HOSPITAL_COMMUNITY)
Admission: RE | Admit: 2020-01-27 | Discharge: 2020-01-27 | Disposition: A | Discharge status: Home / Self Care | Payer: Medicare Other | Source: Skilled Nursing Facility | Attending: Adult Health | Admitting: Adult Health

## 2020-01-27 DIAGNOSIS — E1140 Type 2 diabetes mellitus with diabetic neuropathy, unspecified: Secondary | ICD-10-CM | POA: Diagnosis not present

## 2020-01-28 LAB — MICROALBUMIN, URINE: Microalb, Ur: 19.6 ug/mL — ABNORMAL HIGH

## 2020-01-29 DIAGNOSIS — F331 Major depressive disorder, recurrent, moderate: Secondary | ICD-10-CM | POA: Diagnosis not present

## 2020-02-05 ENCOUNTER — Encounter: Payer: Self-pay | Admitting: Adult Health

## 2020-02-05 ENCOUNTER — Non-Acute Institutional Stay (SKILLED_NURSING_FACILITY): Payer: Medicare Other | Admitting: Adult Health

## 2020-02-05 DIAGNOSIS — L03115 Cellulitis of right lower limb: Secondary | ICD-10-CM

## 2020-02-05 NOTE — Progress Notes (Signed)
Location:    [facility] Room Number: 113/W Place of Service:  SNF (31)   CODE STATUS: DNR  Allergies  Allergen Reactions  . Codeine Sulfate [Codeine] Anaphylaxis and Hives  . Morphine And Related Anaphylaxis and Hives  . Penicillins Anaphylaxis  . Ace Inhibitors Cough    Chief Complaint  Patient presents with  . Acute Visit    Right Foot Redness    HPI:  Staff report that her right foot and lower leg is red hot and swollen. She did not complain of pain with palpation. There are no reports of fevers. She does have a small area on her right lower leg where she has been scratching.   Past Medical History:  Diagnosis Date  . Allergy   . Atrial fibrillation ([place]) 08/2011   First diagnosed in 08/2011; duration of arrhythmia is uncertain  . Bilateral lower extremity edema   . Chronic diarrhea    diverticulosis  . COPD (chronic obstructive pulmonary disease) ([place])   . Dementia ([place])   . Diabetes mellitus, type 2 (HCC)    Diabetic neuropathy  . Dyspnea on exertion    pedal edema  . Gout   . Headache(784.0)    twice weekly  . Hyperlipidemia   . Hypertension   . Osteopenia    DEXA scan 01/2010  . Palpitations   . Seasonal allergies   . Stress incontinence   . Vertigo     Past Surgical History:  Procedure Laterality Date  . APPENDECTOMY    . BREAST BIOPSY  2002   Right  . CESAREAN SECTION     x 2  . CHOLECYSTECTOMY    . COLONOSCOPY  2007   Negative screening study  . COLONOSCOPY WITH PROPOFOL N/A 09/18/2017   Procedure: COLONOSCOPY WITH PROPOFOL;  Surgeon: Daneil Dolin, MD;  [facility];  Service: Endoscopy;  Laterality: N/A;  11:00am  . HAMMER TOE SURGERY     Bilateral hammer toe amputation  . INCISIONAL HERNIA REPAIR    . KNEE ARTHROSCOPY W/ MENISCAL REPAIR  2007   Bilateral  . POLYPECTOMY  09/18/2017   Procedure: POLYPECTOMY;  Surgeon: Daneil Dolin, MD;  [facility];  Service: Endoscopy;;  colon  .  UMBILICAL HERNIA REPAIR      Social History   Socioeconomic History  . Marital status: Widowed    Spouse name: Not on file  . Number of children: 2  . Years of education: Not on file  . Highest education level: Not on file  Occupational History  . Occupation: Engineer, materials  Tobacco Use  . Smoking status: Never Smoker  . Smokeless tobacco: Never Used  Vaping Use  . Vaping Use: Never used  Substance and Sexual Activity  . Alcohol use: No  . Drug use: No  . Sexual activity: Not Currently  Other Topics Concern  . Not on file  Social History Narrative  . Not on file   Social Determinants of Health   Financial Resource Strain: Low Risk   . Difficulty of Paying Living Expenses: Not hard at all  Food Insecurity: No Food Insecurity  . Worried About Charity fundraiser in the Last Year: Never true  . Ran Out of Food in the Last Year: Never true  Transportation Needs: No Transportation Needs  . Lack of Transportation (Medical): No  . Lack of Transportation (Non-Medical): No  Physical Activity: Inactive  . Days of Exercise per Week: 0 days  .  Minutes of Exercise per Session: 0 min  Stress:   . Feeling of Stress : Not on file  Social Connections: Socially Isolated  . Frequency of Communication with Friends and Family: Never  . Frequency of Social Gatherings with Friends and Family: Never  . Attends Religious Services: Never  . Active Member of Clubs or Organizations: Yes  . Attends Archivist Meetings: Never  . Marital Status: Widowed  Intimate Partner Violence: Not At Risk  . Fear of Current or Ex-Partner: No  . Emotionally Abused: No  . Physically Abused: No  . Sexually Abused: No   Family History  Adopted: Yes      VITAL SIGNS BP 107/63   Pulse 75   Temp (!) 97.4 F (36.3 C)   Resp 18   Ht 5\' 6"  (1.676 m)   Wt 176 lb 6.4 oz (80 kg)   SpO2 98%   BMI 28.47 kg/m   Outpatient Encounter Medications as of 02/05/2020  Medication Sig  .  acetaminophen (TYLENOL) 325 MG tablet Take 650 mg by mouth every 6 (six) hours.   Marland Kitchen apixaban (ELIQUIS) 5 MG TABS tablet Take 5 mg by mouth 2 (two) times daily.   . carboxymethylcellulose (REFRESH PLUS) 0.5 % SOLN Place 1 drop into both eyes 4 (four) times daily.  . digoxin (LANOXIN) 0.125 MG tablet Take 1 tablet by mouth once a day (HOLD FOR AP UNDER 60)  . furosemide (LASIX) 20 MG tablet Take 20 mg by mouth daily.  Marland Kitchen guaiFENesin-dextromethorphan (ROBITUSSIN DM) 100-10 MG/5ML syrup Take 15 mLs by mouth every 6 (six) hours as needed for cough. For cough/congestion. May give up to 48 hrs. Inform physician immediately if cough or congestion is associated with fever or SOB  . insulin aspart (NOVOLOG) 100 UNIT/ML injection Inject 17 Units into the skin 3 (three) times daily with meals. If BS above 150  . LANTUS SOLOSTAR 100 UNIT/ML Solostar Pen Inject 40 Units into the skin at bedtime.   . memantine (NAMENDA) 10 MG tablet Take 10 mg by mouth 2 (two) times daily.  . metolazone (ZAROXOLYN) 5 MG tablet Take 5 mg by mouth daily. On Monday and Friday   . metoprolol tartrate (LOPRESSOR) 50 MG tablet Take 50 mg by mouth 2 (two) times daily.  . NON FORMULARY Diet Type:  NAS, consistent CHO, Dysphagia 3 with thin liquids  . NON FORMULARY Give Cranberry Juice twice a day  . omeprazole (PRILOSEC) 40 MG capsule Take 40 mg by mouth daily.  . polyethylene glycol (MIRALAX / GLYCOLAX) packet Take 17 g by mouth daily as needed. daily prn for constipation - mix with 6 oz of liquid and drink  . potassium chloride SA (KLOR-CON) 20 MEQ tablet Take 20 mEq by mouth 4 (four) times daily.   . sitaGLIPtin-metformin (JANUMET) 50-500 MG tablet Take 1 tablet by mouth 2 (two) times daily with a meal.  . spironolactone (ALDACTONE) 25 MG tablet Take 12.5 mg by mouth daily. 1/2 Tablet = 12.5mg   . traMADol (ULTRAM) 50 MG tablet Take 1 tablet (50 mg total) by mouth 3 (three) times daily. Special Instructions: hold if asleep/don't wake up  pt   No facility-administered encounter medications on file as of 02/05/2020.     SIGNIFICANT DIAGNOSTIC EXAMS  PREVIOUS  02-21-19: DEXA: t score -3.504  NO NEW EXAMS.    LABS REVIEWED PREVIOUS:   04-23-19: wbc 9.6; hgb 13.0; hct 41.4; mcv 90.6 plt 280; glucose 187; bun 37 creat 1.36; k+ 3.3; na++  139; ca 9.3 ;liver normal albumin 3.2 05-01-19: k+ 4.3 05-09-19: k+ 3.3; vit B 12: 480 05-13-19: k+ 3.8 05-31-19: glucose 287; bun 27; creat 1.39; k+ 4.4; na++ 138; ca 9.4 07-04-30: wbc 8.9; hgb 10.7; hct 33.9; mcv 92.1 plt 266; glucose 118; bun 33; creat 1.23; k+ 3.2; na++ 137; ca 9.2 chol 129; ldl 82 trig 125; hdl 22 07-11-19: k+ 3.3 07-16-19: glucose 112; bun 26; crat 1.15; k+ 4.2; na++ 139; ca 9.3   11-20-19: wbc 8.1; hgb 12.0; hct 39.0; mcv 93.1 plt 227; glucose 92; bun 27; creat 1.07; k+ 4.5; na++ 139; ca 9.3 liver normal albumin 3.2  12-09-19: hgb a1c 6.4  NO NEW LABS.     Review of Systems  Unable to perform ROS: Dementia (unable to participate )    Physical Exam Constitutional:      General: She is not in acute distress.    Appearance: She is well-developed. She is not diaphoretic.  Neck:     Thyroid: No thyromegaly.  Cardiovascular:     Rate and Rhythm: Normal rate and regular rhythm.     Heart sounds: Normal heart sounds.  Pulmonary:     Effort: Pulmonary effort is normal. No respiratory distress.     Breath sounds: Normal breath sounds.  Chest:     Comments: Right breast mass;large and painful to touch; breast is discolored  Abdominal:     General: Bowel sounds are normal. There is no distension.     Palpations: Abdomen is soft.     Tenderness: There is no abdominal tenderness.  Musculoskeletal:     Right lower leg: No edema.     Left lower leg: No edema.     Comments: Able to move all extremities   Lymphadenopathy:     Cervical: No cervical adenopathy.  Skin:    General: Skin is warm and dry.     Comments: Right lower extremity with small abrasion Right foot  and lower leg with redness warmth edema present   Neurological:     Mental Status: She is alert. Mental status is at baseline.  Psychiatric:        Mood and Affect: Mood normal.        ASSESSMENT/ PLAN:  TODAY  1. Cellulitis right lower leg 2. Cellulitis right foot Will begin doxycycline 100 mg twice daily through 02-19-20. Will monitor her status.    MD is aware of resident's narcotic use and is in agreement with current plan of care. We will attempt to wean resident as appropriate.  Ok Edwards NP [facility]  Contact [phone] Monday through Friday 8am- 5pm  After hours call [phone]

## 2020-02-06 DIAGNOSIS — L03115 Cellulitis of right lower limb: Secondary | ICD-10-CM | POA: Insufficient documentation

## 2020-02-06 DIAGNOSIS — F331 Major depressive disorder, recurrent, moderate: Secondary | ICD-10-CM | POA: Diagnosis not present

## 2020-02-14 DIAGNOSIS — F331 Major depressive disorder, recurrent, moderate: Secondary | ICD-10-CM | POA: Diagnosis not present

## 2020-02-14 DIAGNOSIS — G301 Alzheimer's disease with late onset: Secondary | ICD-10-CM | POA: Diagnosis not present

## 2020-02-19 ENCOUNTER — Other Ambulatory Visit: Payer: Self-pay | Admitting: Adult Health

## 2020-02-19 MED ORDER — TRAMADOL HCL 50 MG PO TABS: 50.0000 mg | ORAL_TABLET | Freq: Three times a day (TID) | ORAL | 0 refills | Status: DC

## 2020-02-20 DIAGNOSIS — F331 Major depressive disorder, recurrent, moderate: Secondary | ICD-10-CM | POA: Diagnosis not present

## 2020-02-24 ENCOUNTER — Non-Acute Institutional Stay (SKILLED_NURSING_FACILITY): Payer: Medicare Other | Admitting: Adult Health

## 2020-02-24 ENCOUNTER — Encounter: Payer: Self-pay | Admitting: Adult Health

## 2020-02-24 DIAGNOSIS — E118 Type 2 diabetes mellitus with unspecified complications: Secondary | ICD-10-CM | POA: Diagnosis not present

## 2020-02-24 DIAGNOSIS — E1169 Type 2 diabetes mellitus with other specified complication: Secondary | ICD-10-CM | POA: Diagnosis not present

## 2020-02-24 DIAGNOSIS — Z794 Long term (current) use of insulin: Secondary | ICD-10-CM

## 2020-02-24 DIAGNOSIS — E1122 Type 2 diabetes mellitus with diabetic chronic kidney disease: Secondary | ICD-10-CM

## 2020-02-24 DIAGNOSIS — E785 Hyperlipidemia, unspecified: Secondary | ICD-10-CM

## 2020-02-24 DIAGNOSIS — N182 Chronic kidney disease, stage 2 (mild): Secondary | ICD-10-CM

## 2020-02-24 NOTE — Progress Notes (Signed)
Location:    [facility] Room Number: 113/W Place of Service:  SNF (31)   CODE STATUS: DNR  Allergies  Allergen Reactions  . Codeine Sulfate [Codeine] Anaphylaxis and Hives  . Morphine And Related Anaphylaxis and Hives  . Penicillins Anaphylaxis  . Ace Inhibitors Cough    Chief Complaint  Patient presents with  . 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 There are no reports of agitation or anxiety. Her appetite is less. Her cellulitis has resolved.   Past Medical History:  Diagnosis Date  . Allergy   . Atrial fibrillation ([place]) 08/2011   First diagnosed in 08/2011; duration of arrhythmia is uncertain  . Bilateral lower extremity edema   . Chronic diarrhea    diverticulosis  . COPD (chronic obstructive pulmonary disease) ([place])   . Dementia ([place])   . Diabetes mellitus, type 2 (HCC)    Diabetic neuropathy  . Dyspnea on exertion    pedal edema  . Gout   . Headache(784.0)    twice weekly  . Hyperlipidemia   . Hypertension   . Osteopenia    DEXA scan 01/2010  . Palpitations   . Seasonal allergies   . Stress incontinence   . Vertigo     Past Surgical History:  Procedure Laterality Date  . APPENDECTOMY    . BREAST BIOPSY  2002   Right  . CESAREAN SECTION     x 2  . CHOLECYSTECTOMY    . COLONOSCOPY  2007   Negative screening study  . COLONOSCOPY WITH PROPOFOL N/A 09/18/2017   Procedure: COLONOSCOPY WITH PROPOFOL;  Surgeon: Daneil Dolin, MD;  [facility];  Service: Endoscopy;  Laterality: N/A;  11:00am  . HAMMER TOE SURGERY     Bilateral hammer toe amputation  . INCISIONAL HERNIA REPAIR    . KNEE ARTHROSCOPY W/ MENISCAL REPAIR  2007   Bilateral  . POLYPECTOMY  09/18/2017   Procedure: POLYPECTOMY;  Surgeon: Daneil Dolin, MD;  [facility];  Service: Endoscopy;;  colon  . UMBILICAL HERNIA REPAIR      Social History   Socioeconomic History  . Marital status: Widowed    Spouse name: Not on file  . Number of children: 2  . Years of education: Not on file  . Highest education level: Not on file  Occupational History  . Occupation: Engineer, materials  Tobacco Use  . Smoking status: Never Smoker  . Smokeless tobacco: Never Used  Vaping Use  . Vaping Use: Never used  Substance and Sexual Activity  . Alcohol use: No  . Drug use: No  . Sexual activity: Not Currently  Other Topics Concern  . Not on file  Social History Narrative  . Not on file   Social Determinants of Health   Financial Resource Strain: Low Risk   . Difficulty of Paying Living Expenses: Not hard at all  Food Insecurity: No Food Insecurity  . Worried About  Running Out of Food in the Last Year: Never true  . Ran Out of Food in the Last Year: Never true  Transportation Needs: No Transportation Needs  . Lack of Transportation (Medical): No  . Lack of Transportation (Non-Medical): No  Physical Activity: Inactive  . Days of Exercise per Week: 0 days  . Minutes of Exercise per Session: 0 min  Stress:   . Feeling of Stress : Not on file  Social Connections: Socially Isolated  . Frequency of Communication with Friends and Family: Never  . Frequency of Social Gatherings with Friends and Family: Never  . Attends Religious Services: Never  . Active Member of Clubs or Organizations: Yes  . Attends Archivist Meetings: Never  . Marital Status: Widowed  Intimate Partner Violence: Not At Risk  . Fear of Current or Ex-Partner: No  .  Emotionally Abused: No  . Physically Abused: No  . Sexually Abused: No   Family History  Adopted: Yes      VITAL SIGNS BP (!) 137/106   Pulse 80   Temp 98 F (36.7 C)   Resp 18   Ht 5\' 6"  (1.676 m)   Wt 176 lb 6.4 oz (80 kg)   BMI 28.47 kg/m   Outpatient Encounter Medications as of 02/24/2020  Medication Sig  . acetaminophen (TYLENOL) 325 MG tablet Take 650 mg by mouth every 6 (six) hours.   Marland Kitchen apixaban (ELIQUIS) 5 MG TABS tablet Take 5 mg by mouth 2 (two) times daily.   . carboxymethylcellulose (REFRESH PLUS) 0.5 % SOLN Place 1 drop into both eyes 4 (four) times daily.  . digoxin (LANOXIN) 0.125 MG tablet Take 1 tablet by mouth once a day (HOLD FOR AP UNDER 60)  . docusate (COLACE) 50 MG/5ML liquid Take 100 mg by mouth every other day.  . furosemide (LASIX) 20 MG tablet Take 20 mg by mouth daily.  Marland Kitchen guaiFENesin-dextromethorphan (ROBITUSSIN DM) 100-10 MG/5ML syrup Take 15 mLs by mouth every 6 (six) hours as needed for cough. For cough/congestion. May give up to 48 hrs. Inform physician immediately if cough or congestion is associated with fever or SOB  . insulin aspart (NOVOLOG) 100 UNIT/ML injection Inject 17 Units into the skin 3 (three) times daily with meals. If BS above 150  . LANTUS SOLOSTAR 100 UNIT/ML Solostar Pen Inject 40 Units into the skin at bedtime.   . memantine (NAMENDA) 10 MG tablet Take 10 mg by mouth 2 (two) times daily.  . metolazone (ZAROXOLYN) 5 MG tablet Take 5 mg by mouth daily. On Monday and Friday   . metoprolol tartrate (LOPRESSOR) 50 MG tablet Take 50 mg by mouth 2 (two) times daily.  . NON FORMULARY Diet Type:  NAS, consistent CHO, Dysphagia 3 with thin liquids  . NON FORMULARY Give Cranberry Juice twice a day  . omeprazole (PRILOSEC) 40 MG capsule Take 40 mg by mouth daily.  . polyethylene glycol (MIRALAX / GLYCOLAX) packet Take 17 g by mouth daily as needed. daily prn for constipation - mix with 6 oz of liquid and drink  . potassium chloride SA  (KLOR-CON) 20 MEQ tablet Take 20 mEq by mouth 4 (four) times daily.   . sitaGLIPtin-metformin (JANUMET) 50-500 MG tablet Take 1 tablet by mouth 2 (two) times daily with a meal.  . spironolactone (ALDACTONE) 25 MG tablet Take 12.5 mg by mouth daily. 1/2 Tablet = 12.5mg   . traMADol (ULTRAM) 50 MG tablet Take 1 tablet (50 mg total) by mouth 3 (three)  times daily. Special Instructions: hold if asleep/don't wake up pt   No facility-administered encounter medications on file as of 02/24/2020.     SIGNIFICANT DIAGNOSTIC EXAMS  PREVIOUS  02-21-19: DEXA: t score -3.504  NO NEW EXAMS.    LABS REVIEWED PREVIOUS:   04-23-19: wbc 9.6; hgb 13.0; hct 41.4; mcv 90.6 plt 280; glucose 187; bun 37 creat 1.36; k+ 3.3; na++ 139; ca 9.3 ;liver normal albumin 3.2 05-01-19: k+ 4.3 05-09-19: k+ 3.3; vit B 12: 480 05-13-19: k+ 3.8 05-31-19: glucose 287; bun 27; creat 1.39; k+ 4.4; na++ 138; ca 9.4 07-04-30: wbc 8.9; hgb 10.7; hct 33.9; mcv 92.1 plt 266; glucose 118; bun 33; creat 1.23; k+ 3.2; na++ 137; ca 9.2 chol 129; ldl 82 trig 125; hdl 22 07-11-19: k+ 3.3 07-16-19: glucose 112; bun 26; crat 1.15; k+ 4.2; na++ 139; ca 9.3   11-20-19: wbc 8.1; hgb 12.0; hct 39.0; mcv 93.1 plt 227; glucose 92; bun 27; creat 1.07; k+ 4.5; na++ 139; ca 9.3 liver normal albumin 3.2  12-09-19: hgb a1c 6.4    TODAY  01-27-20: urine micro-albumin: 19.6    Review of Systems  Unable to perform ROS: Dementia (unable to participate )    Physical Exam Constitutional:      General: She is not in acute distress.    Appearance: She is well-developed. She is not diaphoretic.  Neck:     Thyroid: No thyromegaly.  Cardiovascular:     Rate and Rhythm: Normal rate and regular rhythm.     Pulses: Normal pulses.     Heart sounds: Normal heart sounds.  Pulmonary:     Effort: Pulmonary effort is normal. No respiratory distress.     Breath sounds: Normal breath sounds.  Chest:     Comments: Right breast mass;large and painful to touch;  breast is discolored  Abdominal:     General: Bowel sounds are normal. There is no distension.     Palpations: Abdomen is soft.     Tenderness: There is no abdominal tenderness.  Musculoskeletal:     Cervical back: Neck supple.     Right lower leg: No edema.     Left lower leg: No edema.     Comments: Able to move all extremities    Lymphadenopathy:     Cervical: No cervical adenopathy.  Skin:    General: Skin is warm and dry.  Neurological:     Mental Status: She is alert. Mental status is at baseline.  Psychiatric:        Mood and Affect: Mood normal.        ASSESSMENT/ PLAN:  TODAY:   1. 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   2. Dyslipidemia associated with type 2 diabetes mellitus: is stable LDL 125 will monitor   3. CKD stage 2 due to type 2 diabetes mellitus: is stable bun 27 creat 1.07   PREVIOUS   4. Hypertension associated with stage 2 chronic kidney disease due to type 2 diabetes mellitus is stable b/p 137/106 will continue lopressor 50 mg twice daily   5. Chronic atrial fibrillation is stable will continue digoxin 0.125 mg daily lopressor 50 mg twice daily for rate control is on long term eliquis 5 mg twice daily   6. Bilateral lower extremity edema: is stable will continue lasix 20  mg daily and aldactone 12.5 mg daily  7. Hypokalemia is stable k+ 4.5 will lower k+ to 20 meq four times daily   8. GERD without esophagitis: is stable will continue prilosec 40 mg daily   9. Dementia with behavioral disturbance unspecified dementia type: is without change in status: is slowly losing weight; her weight is 176 pounds; will continue namenda 10 mg twice daily her weight is an expected but unfortunate outcome at the late stages of this disease process.   10. Right breast  cancer she is slowly deteriorating her family has decided against mastectomy will continue tylenol 650 mg every 6 hours and ultram 50 mg three times daily   11. Age related osteoporosis without current pathological fracture is stable t-score -3.504 is off prolia will monitor    12. Major depression recurrent chronic: is stable off medications will monitor   Will check digoxin level.   MD is aware of resident's narcotic use and is in agreement with current plan of care. We will attempt to wean resident as appropriate.  Ok Edwards NP [facility]  Contact [phone] Monday through Friday 8am- 5pm  After hours call [phone]

## 2020-02-26 ENCOUNTER — Encounter: Payer: Self-pay | Admitting: Adult Health

## 2020-02-26 ENCOUNTER — Non-Acute Institutional Stay (SKILLED_NURSING_FACILITY): Payer: Medicare Other | Admitting: Adult Health

## 2020-02-26 ENCOUNTER — Other Ambulatory Visit: Payer: Self-pay | Admitting: Adult Health

## 2020-02-26 DIAGNOSIS — E118 Type 2 diabetes mellitus with unspecified complications: Secondary | ICD-10-CM

## 2020-02-26 DIAGNOSIS — Z794 Long term (current) use of insulin: Secondary | ICD-10-CM | POA: Diagnosis not present

## 2020-02-26 DIAGNOSIS — C50911 Malignant neoplasm of unspecified site of right female breast: Secondary | ICD-10-CM | POA: Diagnosis not present

## 2020-02-26 MED ORDER — HYDROCODONE-ACETAMINOPHEN 7.5-325 MG/15ML PO SOLN: 7.5000 mL | Freq: Four times a day (QID) | ORAL | 0 refills | Status: DC | PRN

## 2020-02-26 NOTE — Progress Notes (Signed)
Location:    [facility] Room Number: 113/W Place of Service:  SNF (31)   CODE STATUS: DNR  Allergies  Allergen Reactions  . Codeine Sulfate [Codeine] Anaphylaxis and Hives  . Morphine And Related Anaphylaxis and Hives  . Penicillins Anaphylaxis  . Ace Inhibitors Cough    Chief Complaint  Patient presents with  . Acute Visit    Diabetes    HPI:  She is having increased pain staff reports that she is more resistant to adl care and is short tempered; which could represent pain. She has had some cbg readings which are low; her appetite is worse. I have spoken with her daughter regarding pain management. She does not want her mom overly sedated if possible. She is willing to try low dose hydrocodone to help with her pain management.   Past Medical History:  Diagnosis Date  . Allergy   . Atrial fibrillation ([place]) 08/2011   First diagnosed in 08/2011; duration of arrhythmia is uncertain  . Bilateral lower extremity edema   . Chronic diarrhea    diverticulosis  . COPD (chronic obstructive pulmonary disease) ([place])   . Dementia ([place])   . Diabetes mellitus, type 2 (HCC)    Diabetic neuropathy  . Dyspnea on exertion    pedal edema  . Gout   . Headache(784.0)    twice weekly  . Hyperlipidemia   . Hypertension   . Osteopenia    DEXA scan 01/2010  . Palpitations   . Seasonal allergies   . Stress incontinence   . Vertigo     Past Surgical History:  Procedure Laterality Date  . APPENDECTOMY    . BREAST BIOPSY  2002   Right  . CESAREAN SECTION     x 2  . CHOLECYSTECTOMY    . COLONOSCOPY  2007   Negative screening study  . COLONOSCOPY WITH PROPOFOL N/A 09/18/2017   Procedure: COLONOSCOPY WITH PROPOFOL;  Surgeon: Daneil Dolin, MD;  [facility];  Service: Endoscopy;  Laterality: N/A;  11:00am  . HAMMER TOE SURGERY     Bilateral hammer toe amputation  . INCISIONAL HERNIA REPAIR    . KNEE ARTHROSCOPY W/ MENISCAL REPAIR  2007   Bilateral   . POLYPECTOMY  09/18/2017   Procedure: POLYPECTOMY;  Surgeon: Daneil Dolin, MD;  [facility];  Service: Endoscopy;;  colon  . UMBILICAL HERNIA REPAIR      Social History   Socioeconomic History  . Marital status: Widowed    Spouse name: Not on file  . Number of children: 2  . Years of education: Not on file  . Highest education level: Not on file  Occupational History  . Occupation: Engineer, materials  Tobacco Use  . Smoking status: Never Smoker  . Smokeless tobacco: Never Used  Vaping Use  . Vaping Use: Never used  Substance and Sexual Activity  . Alcohol use: No  . Drug use: No  . Sexual activity: Not Currently  Other Topics Concern  . Not on file  Social History Narrative  . Not on file   Social Determinants of Health   Financial Resource Strain: Low Risk   . Difficulty of Paying Living Expenses: Not hard at all  Food Insecurity: No Food Insecurity  . Worried About Charity fundraiser in the Last Year: Never true  . Ran Out of Food in the Last Year: Never true  Transportation Needs: No Transportation Needs  . Lack of Transportation (  Medical): No  . Lack of Transportation (Non-Medical): No  Physical Activity: Inactive  . Days of Exercise per Week: 0 days  . Minutes of Exercise per Session: 0 min  Stress:   . Feeling of Stress : Not on file  Social Connections: Socially Isolated  . Frequency of Communication with Friends and Family: Never  . Frequency of Social Gatherings with Friends and Family: Never  . Attends Religious Services: Never  . Active Member of Clubs or Organizations: Yes  . Attends Archivist Meetings: Never  . Marital Status: Widowed  Intimate Partner Violence: Not At Risk  . Fear of Current or Ex-Partner: No  . Emotionally Abused: No  . Physically Abused: No  . Sexually Abused: No   Family History  Adopted: Yes      VITAL SIGNS BP (!) 137/106   Pulse 77   Temp 97.8 F (36.6 C)   Resp 18   Ht 5\' 6"  (1.676  m)   Wt 176 lb 6.4 oz (80 kg)   BMI 28.47 kg/m   Outpatient Encounter Medications as of 02/26/2020  Medication Sig  . acetaminophen (TYLENOL) 325 MG tablet Take 650 mg by mouth every 6 (six) hours.   Marland Kitchen apixaban (ELIQUIS) 5 MG TABS tablet Take 5 mg by mouth 2 (two) times daily.   . carboxymethylcellulose (REFRESH PLUS) 0.5 % SOLN Place 1 drop into both eyes 4 (four) times daily.  . digoxin (LANOXIN) 0.125 MG tablet Take 1 tablet by mouth once a day (HOLD FOR AP UNDER 60)  . docusate (COLACE) 50 MG/5ML liquid Take 100 mg by mouth every other day.  . furosemide (LASIX) 20 MG tablet Take 20 mg by mouth daily.  Marland Kitchen guaiFENesin-dextromethorphan (ROBITUSSIN DM) 100-10 MG/5ML syrup Take 15 mLs by mouth every 6 (six) hours as needed for cough. For cough/congestion. May give up to 48 hrs. Inform physician immediately if cough or congestion is associated with fever or SOB  . HYDROcodone-acetaminophen (NORCO) 7.5-325 MG tablet Take 1 tablet by mouth 2 (two) times daily. Special Instructions: monitor patient closely for allergic response may hold for sedation for pain due to breast cancer  . insulin aspart (NOVOLOG) 100 UNIT/ML injection Inject 17 Units into the skin daily. With breakfast  . insulin aspart (NOVOLOG) 100 UNIT/ML injection Inject 20 Units into the skin 2 (two) times daily. With lunch and dinner  . LANTUS SOLOSTAR 100 UNIT/ML Solostar Pen Inject 40 Units into the skin at bedtime.   . memantine (NAMENDA) 10 MG tablet Take 10 mg by mouth 2 (two) times daily.  . metolazone (ZAROXOLYN) 5 MG tablet Take 5 mg by mouth daily. On Monday and Friday   . metoprolol tartrate (LOPRESSOR) 50 MG tablet Take 50 mg by mouth 2 (two) times daily.  . NON FORMULARY Diet Type:  NAS, consistent CHO, Dysphagia 3 with thin liquids  . NON FORMULARY Give Cranberry Juice twice a day  . omeprazole (PRILOSEC) 40 MG capsule Take 40 mg by mouth daily.  . polyethylene glycol (MIRALAX / GLYCOLAX) packet Take 17 g by mouth  daily as needed. daily prn for constipation - mix with 6 oz of liquid and drink  . potassium chloride SA (KLOR-CON) 20 MEQ tablet Take 20 mEq by mouth 4 (four) times daily.   . sitaGLIPtin-metformin (JANUMET) 50-500 MG tablet Take 1 tablet by mouth 2 (two) times daily with a meal.  . spironolactone (ALDACTONE) 25 MG tablet Take 12.5 mg by mouth daily. 1/2 Tablet = 12.5mg   .  traMADol (ULTRAM) 50 MG tablet Take 1 tablet (50 mg total) by mouth 3 (three) times daily. Special Instructions: hold if asleep/don't wake up pt  . [DISCONTINUED] HYDROcodone-acetaminophen (HYCET) 7.5-325 mg/15 ml solution Take 7.5 mLs by mouth 4 (four) times daily as needed for moderate pain.   No facility-administered encounter medications on file as of 02/26/2020.     SIGNIFICANT DIAGNOSTIC EXAMS   PREVIOUS  02-21-19: DEXA: t score -3.504  NO NEW EXAMS.    LABS REVIEWED PREVIOUS:   04-23-19: wbc 9.6; hgb 13.0; hct 41.4; mcv 90.6 plt 280; glucose 187; bun 37 creat 1.36; k+ 3.3; na++ 139; ca 9.3 ;liver normal albumin 3.2 05-01-19: k+ 4.3 05-09-19: k+ 3.3; vit B 12: 480 05-13-19: k+ 3.8 05-31-19: glucose 287; bun 27; creat 1.39; k+ 4.4; na++ 138; ca 9.4 07-04-30: wbc 8.9; hgb 10.7; hct 33.9; mcv 92.1 plt 266; glucose 118; bun 33; creat 1.23; k+ 3.2; na++ 137; ca 9.2 chol 129; ldl 82 trig 125; hdl 22 07-11-19: k+ 3.3 07-16-19: glucose 112; bun 26; crat 1.15; k+ 4.2; na++ 139; ca 9.3   11-20-19: wbc 8.1; hgb 12.0; hct 39.0; mcv 93.1 plt 227; glucose 92; bun 27; creat 1.07; k+ 4.5; na++ 139; ca 9.3 liver normal albumin 3.2  12-09-19: hgb a1c 6.4   01-27-20: urine micro-albumin: 19.6  NO NEW LABS.    Review of Systems  Unable to perform ROS: Dementia (unable to participate )   Physical Exam Constitutional:      General: She is not in acute distress.    Appearance: She is well-developed. She is not diaphoretic.  Neck:     Thyroid: No thyromegaly.  Cardiovascular:     Rate and Rhythm: Normal rate and regular rhythm.       Heart sounds: Normal heart sounds.  Pulmonary:     Effort: Pulmonary effort is normal. No respiratory distress.     Breath sounds: Normal breath sounds.  Chest:     Comments: Right breast very large mass present breast is discolored is painful  Abdominal:     General: Bowel sounds are normal. There is no distension.     Palpations: Abdomen is soft.     Tenderness: There is no abdominal tenderness.  Musculoskeletal:     Right lower leg: No edema.     Left lower leg: No edema.     Comments: Is able to move all extremities   Lymphadenopathy:     Cervical: No cervical adenopathy.  Skin:    General: Skin is warm and dry.  Neurological:     Mental Status: She is alert. Mental status is at baseline.  Psychiatric:        Mood and Affect: Mood normal.       ASSESSMENT/ PLAN:  TODAY  1. Malignant neoplasm of female right breast unspecified estrogen receptor status unspecified site of breast 2. Controlled type 2 diabetes mellitus with complication with long term current use of insulin  Her status is worse Will stop novolog and will lower lantus to 20 units nightly  Will begin hycet 7.5 mL twice daily to help with AM and PM care.  Will monitor her status.   MD is aware of resident's narcotic use and is in agreement with current plan of care. We will attempt to wean resident as appropriate.  Ok Edwards NP [facility]  Contact [phone] Monday through Friday 8am- 5pm  After hours call [phone]

## 2020-02-27 ENCOUNTER — Other Ambulatory Visit (HOSPITAL_COMMUNITY)
Admission: RE | Admit: 2020-02-27 | Discharge: 2020-02-27 | Disposition: A | Discharge status: Home / Self Care | Payer: Medicare Other | Source: Skilled Nursing Facility | Attending: Adult Health | Admitting: Adult Health

## 2020-02-27 DIAGNOSIS — F331 Major depressive disorder, recurrent, moderate: Secondary | ICD-10-CM | POA: Diagnosis not present

## 2020-02-27 DIAGNOSIS — I5042 Chronic combined systolic (congestive) and diastolic (congestive) heart failure: Secondary | ICD-10-CM | POA: Insufficient documentation

## 2020-02-27 LAB — DIGOXIN LEVEL: Digoxin Level: 1.1 ng/mL (ref 1.0–2.0)

## 2020-02-28 ENCOUNTER — Non-Acute Institutional Stay (SKILLED_NURSING_FACILITY): Payer: Medicare Other | Admitting: Adult Health

## 2020-02-28 ENCOUNTER — Encounter: Payer: Self-pay | Admitting: Adult Health

## 2020-02-28 DIAGNOSIS — Z66 Do not resuscitate: Secondary | ICD-10-CM | POA: Diagnosis not present

## 2020-02-28 DIAGNOSIS — C50911 Malignant neoplasm of unspecified site of right female breast: Secondary | ICD-10-CM

## 2020-02-28 NOTE — Progress Notes (Signed)
Location:    [facility] Room Number: 113-W Place of Service:  SNF (31)   CODE STATUS: DNR  Allergies  Allergen Reactions  . Codeine Sulfate [Codeine] Anaphylaxis and Hives  . Morphine And Related Anaphylaxis and Hives  . Penicillins Anaphylaxis  . Ace Inhibitors Cough    Chief Complaint  Patient presents with  . Acute Visit    Visit to address concerns related to cancer pain    HPI:  She has right breast cancer. The tumor has come out of her skin. She is having some bleeding present from the wound. She is also having increased pain. She told me today that she hurts real bad. I have spoken with her daughter at great length regarding her status. She is willing to stop the eliquis due to her bleeding; but wants low dose asa in place. She willing to increase the hycet to every 6 hours. She is not willing at this time to stop any other medications. She does not want her mother to have elevated cbg readings if possible. We have talked about her prognosis; which is very poor. She is having great difficulty dealing with the prognosis.    Past Medical History:  Diagnosis Date  . Allergy   . Atrial fibrillation ([place]) 08/2011   First diagnosed in 08/2011; duration of arrhythmia is uncertain  . Bilateral lower extremity edema   . Chronic diarrhea    diverticulosis  . COPD (chronic obstructive pulmonary disease) ([place])   . Dementia ([place])   . Diabetes mellitus, type 2 (HCC)    Diabetic neuropathy  . Dyspnea on exertion    pedal edema  . Gout   . Headache(784.0)    twice weekly  . Hyperlipidemia   . Hypertension   . Osteopenia    DEXA scan 01/2010  . Palpitations   . Seasonal allergies   . Stress incontinence   . Vertigo     Past Surgical History:  Procedure Laterality Date  . APPENDECTOMY    . BREAST BIOPSY  2002   Right  . CESAREAN SECTION     x 2  . CHOLECYSTECTOMY    . COLONOSCOPY  2007   Negative screening study  . COLONOSCOPY WITH PROPOFOL  N/A 09/18/2017   Procedure: COLONOSCOPY WITH PROPOFOL;  Surgeon: Daneil Dolin, MD;  [facility];  Service: Endoscopy;  Laterality: N/A;  11:00am  . HAMMER TOE SURGERY     Bilateral hammer toe amputation  . INCISIONAL HERNIA REPAIR    . KNEE ARTHROSCOPY W/ MENISCAL REPAIR  2007   Bilateral  . POLYPECTOMY  09/18/2017   Procedure: POLYPECTOMY;  Surgeon: Daneil Dolin, MD;  Location: [facility];  Service: Endoscopy;;  colon  . UMBILICAL HERNIA REPAIR      Social History   Socioeconomic History  . Marital status: Widowed    Spouse name: Not on file  . Number of children: 2  . Years of education: Not on file  . Highest education level: Not on file  Occupational History  . Occupation: Engineer, materials  Tobacco Use  . Smoking status: Never Smoker  . Smokeless tobacco: Never Used  Vaping Use  . Vaping Use: Never used  Substance and Sexual Activity  . Alcohol use: No  . Drug use: No  . Sexual activity: Not Currently  Other Topics Concern  . Not on file  Social History Narrative  . Not on file   Social Determinants of Health  Financial Resource Strain: Low Risk   . Difficulty of Paying Living Expenses: Not hard at all  Food Insecurity: No Food Insecurity  . Worried About Charity fundraiser in the Last Year: Never true  . Ran Out of Food in the Last Year: Never true  Transportation Needs: No Transportation Needs  . Lack of Transportation (Medical): No  . Lack of Transportation (Non-Medical): No  Physical Activity: Inactive  . Days of Exercise per Week: 0 days  . Minutes of Exercise per Session: 0 min  Stress:   . Feeling of Stress : Not on file  Social Connections: Socially Isolated  . Frequency of Communication with Friends and Family: Never  . Frequency of Social Gatherings with Friends and Family: Never  . Attends Religious Services: Never  . Active Member of Clubs or Organizations: Yes  . Attends Archivist Meetings: Never  . Marital  Status: Widowed  Intimate Partner Violence: Not At Risk  . Fear of Current or Ex-Partner: No  . Emotionally Abused: No  . Physically Abused: No  . Sexually Abused: No   Family History  Adopted: Yes      VITAL SIGNS BP (!) 103/58   Pulse 70   Temp 98 F (36.7 C)   Resp 18   Ht 5\' 6"  (1.676 m)   Wt 176 lb 6.4 oz (80 kg)   SpO2 98%   BMI 28.47 kg/m   Outpatient Encounter Medications as of 02/28/2020  Medication Sig  . acetaminophen (TYLENOL) 325 MG tablet Take 650 mg by mouth every 6 (six) hours.   Marland Kitchen apixaban (ELIQUIS) 5 MG TABS tablet Take 5 mg by mouth 2 (two) times daily.   . carboxymethylcellulose (REFRESH PLUS) 0.5 % SOLN Place 1 drop into both eyes 4 (four) times daily.  . digoxin (LANOXIN) 0.125 MG tablet Take 1 tablet by mouth once a day (HOLD FOR AP UNDER 60)  . docusate (COLACE) 50 MG/5ML liquid Take 100 mg by mouth every other day.  . furosemide (LASIX) 20 MG tablet Take 20 mg by mouth daily.  Marland Kitchen guaiFENesin-dextromethorphan (ROBITUSSIN DM) 100-10 MG/5ML syrup Take 15 mLs by mouth every 6 (six) hours as needed for cough. For cough/congestion. May give up to 48 hrs. Inform physician immediately if cough or congestion is associated with fever or SOB  . LANTUS SOLOSTAR 100 UNIT/ML Solostar Pen Inject 20 Units into the skin at bedtime.   . memantine (NAMENDA) 10 MG tablet Take 10 mg by mouth 2 (two) times daily.  . metolazone (ZAROXOLYN) 5 MG tablet Take 5 mg by mouth daily. On Monday and Friday   . metoprolol tartrate (LOPRESSOR) 50 MG tablet Take 50 mg by mouth 2 (two) times daily.  . NON FORMULARY Diet Type:  NAS, consistent CHO, Dysphagia 3 with thin liquids  . NON FORMULARY Give Cranberry Juice twice a day  . omeprazole (PRILOSEC) 40 MG capsule Take 40 mg by mouth daily.  . polyethylene glycol (MIRALAX / GLYCOLAX) packet Take 17 g by mouth daily as needed. daily prn for constipation - mix with 6 oz of liquid and drink  . potassium chloride SA (KLOR-CON) 20 MEQ tablet  Take 20 mEq by mouth 4 (four) times daily.   . sitaGLIPtin-metformin (JANUMET) 50-500 MG tablet Take 1 tablet by mouth 2 (two) times daily with a meal.  . spironolactone (ALDACTONE) 25 MG tablet Take 12.5 mg by mouth daily. 1/2 Tablet = 12.5mg   . traMADol (ULTRAM) 50 MG tablet Take 1 tablet (  50 mg total) by mouth 3 (three) times daily. Special Instructions: hold if asleep/don't wake up pt  . [DISCONTINUED] HYDROcodone-acetaminophen (NORCO) 7.5-325 MG tablet Take 1 tablet by mouth 2 (two) times daily. Special Instructions: monitor patient closely for allergic response may hold for sedation for pain due to breast cancer  . [DISCONTINUED] insulin aspart (NOVOLOG) 100 UNIT/ML injection Inject 17 Units into the skin daily. With breakfast  . [DISCONTINUED] insulin aspart (NOVOLOG) 100 UNIT/ML injection Inject 20 Units into the skin 2 (two) times daily. With lunch and dinner   No facility-administered encounter medications on file as of 02/28/2020.     SIGNIFICANT DIAGNOSTIC EXAMS   PREVIOUS  02-21-19: DEXA: t score -3.504  NO NEW EXAMS.    LABS REVIEWED PREVIOUS:   04-23-19: wbc 9.6; hgb 13.0; hct 41.4; mcv 90.6 plt 280; glucose 187; bun 37 creat 1.36; k+ 3.3; na++ 139; ca 9.3 ;liver normal albumin 3.2 05-01-19: k+ 4.3 05-09-19: k+ 3.3; vit B 12: 480 05-13-19: k+ 3.8 05-31-19: glucose 287; bun 27; creat 1.39; k+ 4.4; na++ 138; ca 9.4 07-04-30: wbc 8.9; hgb 10.7; hct 33.9; mcv 92.1 plt 266; glucose 118; bun 33; creat 1.23; k+ 3.2; na++ 137; ca 9.2 chol 129; ldl 82 trig 125; hdl 22 07-11-19: k+ 3.3 07-16-19: glucose 112; bun 26; crat 1.15; k+ 4.2; na++ 139; ca 9.3   11-20-19: wbc 8.1; hgb 12.0; hct 39.0; mcv 93.1 plt 227; glucose 92; bun 27; creat 1.07; k+ 4.5; na++ 139; ca 9.3 liver normal albumin 3.2  12-09-19: hgb a1c 6.4   01-27-20: urine micro-albumin: 19.6  NO NEW LABS.   Review of Systems  Unable to perform ROS: Dementia (unable to participate )    Physical Exam Constitutional:       General: She is not in acute distress.    Appearance: She is well-developed. She is not diaphoretic.  Neck:     Thyroid: No thyromegaly.  Cardiovascular:     Rate and Rhythm: Normal rate and regular rhythm.     Pulses: Normal pulses.     Heart sounds: Normal heart sounds.  Pulmonary:     Effort: Pulmonary effort is normal. No respiratory distress.     Breath sounds: Normal breath sounds.  Chest:     Comments:  Right breast very large mass present breast is discolored is painful is open with bleeding present  Abdominal:     General: Bowel sounds are normal. There is no distension.     Palpations: Abdomen is soft.     Tenderness: There is no abdominal tenderness.  Musculoskeletal:     Cervical back: Neck supple.     Right lower leg: No edema.     Left lower leg: No edema.     Comments: Is able to move all extremities   Lymphadenopathy:     Cervical: No cervical adenopathy.  Skin:    General: Skin is warm and dry.  Neurological:     Mental Status: She is alert. Mental status is at baseline.  Psychiatric:        Mood and Affect: Mood normal.      ASSESSMENT/ PLAN:  TODAY  1. Malignant neoplasm of right female breast unspecified estrogen receptor status unspecified site of breast.   Her status is declining.  Will stop eliquis due to her bleeding Will begin asa 81 mg daily  Will change hycet to 7.5 mL every 6 hours  Will monitor her status.   Time spent with patient and family 27  minutes: discussed prognosis; goals of care; pain management; medication management. Family verbalized understanding.   MD is aware of resident's narcotic use and is in agreement with current plan of care. We will attempt to wean resident as appropriate.  Ok Edwards NP [facility]  Contact [phone] Monday through Friday 8am- 5pm  After hours call [phone]

## 2020-03-03 MED ORDER — HYDROCODONE-ACETAMINOPHEN 7.5-325 MG/15ML PO SOLN: 7.5000 mL | Freq: Four times a day (QID) | ORAL | 0 refills | Status: DC

## 2020-03-04 DIAGNOSIS — F0390 Unspecified dementia without behavioral disturbance: Secondary | ICD-10-CM | POA: Diagnosis not present

## 2020-03-04 DIAGNOSIS — R1310 Dysphagia, unspecified: Secondary | ICD-10-CM | POA: Diagnosis not present

## 2020-03-04 DIAGNOSIS — Z741 Need for assistance with personal care: Secondary | ICD-10-CM | POA: Diagnosis not present

## 2020-03-04 DIAGNOSIS — E1140 Type 2 diabetes mellitus with diabetic neuropathy, unspecified: Secondary | ICD-10-CM | POA: Diagnosis not present

## 2020-03-05 DIAGNOSIS — R1310 Dysphagia, unspecified: Secondary | ICD-10-CM | POA: Diagnosis not present

## 2020-03-05 DIAGNOSIS — F0390 Unspecified dementia without behavioral disturbance: Secondary | ICD-10-CM | POA: Diagnosis not present

## 2020-03-05 DIAGNOSIS — F331 Major depressive disorder, recurrent, moderate: Secondary | ICD-10-CM | POA: Diagnosis not present

## 2020-03-05 DIAGNOSIS — E1140 Type 2 diabetes mellitus with diabetic neuropathy, unspecified: Secondary | ICD-10-CM | POA: Diagnosis not present

## 2020-03-05 DIAGNOSIS — Z741 Need for assistance with personal care: Secondary | ICD-10-CM | POA: Diagnosis not present

## 2020-03-06 DIAGNOSIS — R1310 Dysphagia, unspecified: Secondary | ICD-10-CM | POA: Diagnosis not present

## 2020-03-06 DIAGNOSIS — E1140 Type 2 diabetes mellitus with diabetic neuropathy, unspecified: Secondary | ICD-10-CM | POA: Diagnosis not present

## 2020-03-06 DIAGNOSIS — F0390 Unspecified dementia without behavioral disturbance: Secondary | ICD-10-CM | POA: Diagnosis not present

## 2020-03-06 DIAGNOSIS — Z741 Need for assistance with personal care: Secondary | ICD-10-CM | POA: Diagnosis not present

## 2020-03-09 ENCOUNTER — Non-Acute Institutional Stay (SKILLED_NURSING_FACILITY): Payer: Medicare Other | Admitting: Adult Health

## 2020-03-09 ENCOUNTER — Encounter: Payer: Self-pay | Admitting: Adult Health

## 2020-03-09 DIAGNOSIS — R627 Adult failure to thrive: Secondary | ICD-10-CM | POA: Diagnosis not present

## 2020-03-09 DIAGNOSIS — F0390 Unspecified dementia without behavioral disturbance: Secondary | ICD-10-CM | POA: Diagnosis not present

## 2020-03-09 DIAGNOSIS — F0391 Unspecified dementia with behavioral disturbance: Secondary | ICD-10-CM | POA: Diagnosis not present

## 2020-03-09 DIAGNOSIS — Z741 Need for assistance with personal care: Secondary | ICD-10-CM | POA: Diagnosis not present

## 2020-03-09 DIAGNOSIS — E1140 Type 2 diabetes mellitus with diabetic neuropathy, unspecified: Secondary | ICD-10-CM | POA: Diagnosis not present

## 2020-03-09 DIAGNOSIS — C50511 Malignant neoplasm of lower-outer quadrant of right female breast: Secondary | ICD-10-CM

## 2020-03-09 DIAGNOSIS — R1310 Dysphagia, unspecified: Secondary | ICD-10-CM | POA: Diagnosis not present

## 2020-03-09 NOTE — Progress Notes (Signed)
Location:    [place] Room Number: 113/W Place of Service:  SNF (31)   CODE STATUS: DNR  Allergies  Allergen Reactions  . Codeine Sulfate [Codeine] Anaphylaxis and Hives  . Morphine And Related Anaphylaxis and Hives  . Penicillins Anaphylaxis  . Ace Inhibitors Cough    Chief Complaint  Patient presents with  . Acute Visit    Weight Loss    HPI:  She is losing weight. Her weight on 02-28-20: 176 pounds on 03-09-20: 162 pounds. Her breast cancer is advancing. Her po intake is poor. She continues on routine hycet every 6 hours for pain management. She is having increased difficulty with swallowing her medications. At this time there are no indications of excessive pain present. She is able to rest.   Past Medical History:  Diagnosis Date  . Allergy   . Atrial fibrillation ([place]) 08/2011   First diagnosed in 08/2011; duration of arrhythmia is uncertain  . Bilateral lower extremity edema   . Chronic diarrhea    diverticulosis  . COPD (chronic obstructive pulmonary disease) ([place])   . Dementia ([place])   . Diabetes mellitus, type 2 (HCC)    Diabetic neuropathy  . Dyspnea on exertion    pedal edema  . Gout   . Headache(784.0)    twice weekly  . Hyperlipidemia   . Hypertension   . Osteopenia    DEXA scan 01/2010  . Palpitations   . Seasonal allergies   . Stress incontinence   . Vertigo     Past Surgical History:  Procedure Laterality Date  . APPENDECTOMY    . BREAST BIOPSY  2002   Right  . CESAREAN SECTION     x 2  . CHOLECYSTECTOMY    . COLONOSCOPY  2007   Negative screening study  . COLONOSCOPY WITH PROPOFOL N/A 09/18/2017   Procedure: COLONOSCOPY WITH PROPOFOL;  Surgeon: Daneil Dolin, MD;  [facility];  Service: Endoscopy;  Laterality: N/A;  11:00am  . HAMMER TOE SURGERY     Bilateral hammer toe amputation  . INCISIONAL HERNIA REPAIR    . KNEE ARTHROSCOPY W/ MENISCAL REPAIR  2007   Bilateral  . POLYPECTOMY  09/18/2017    Procedure: POLYPECTOMY;  Surgeon: Daneil Dolin, MD;  [facility];  Service: Endoscopy;;  colon  . UMBILICAL HERNIA REPAIR      Social History   Socioeconomic History  . Marital status: Widowed    Spouse name: Not on file  . Number of children: 2  . Years of education: Not on file  . Highest education level: Not on file  Occupational History  . Occupation: Engineer, materials  Tobacco Use  . Smoking status: Never Smoker  . Smokeless tobacco: Never Used  Vaping Use  . Vaping Use: Never used  Substance and Sexual Activity  . Alcohol use: No  . Drug use: No  . Sexual activity: Not Currently  Other Topics Concern  . Not on file  Social History Narrative  . Not on file   Social Determinants of Health   Financial Resource Strain: Low Risk   . Difficulty of Paying Living Expenses: Not hard at all  Food Insecurity: No Food Insecurity  . Worried About Charity fundraiser in the Last Year: Never true  . Ran Out of Food in the Last Year: Never true  Transportation Needs: No Transportation Needs  . Lack of Transportation (Medical): No  . Lack of Transportation (Non-Medical):  No  Physical Activity: Inactive  . Days of Exercise per Week: 0 days  . Minutes of Exercise per Session: 0 min  Stress:   . Feeling of Stress : Not on file  Social Connections: Socially Isolated  . Frequency of Communication with Friends and Family: Never  . Frequency of Social Gatherings with Friends and Family: Never  . Attends Religious Services: Never  . Active Member of Clubs or Organizations: Yes  . Attends Archivist Meetings: Never  . Marital Status: Widowed  Intimate Partner Violence: Not At Risk  . Fear of Current or Ex-Partner: No  . Emotionally Abused: No  . Physically Abused: No  . Sexually Abused: No   Family History  Adopted: Yes      VITAL SIGNS BP (!) 106/58   Pulse 94   Temp 97.7 F (36.5 C)   Resp 20   Ht 5\' 6"  (1.676 m)   Wt 162 lb 6.4 oz (73.7 kg)    BMI 26.21 kg/m   Outpatient Encounter Medications as of 03/09/2020  Medication Sig  . acetaminophen (TYLENOL) 325 MG tablet Take 650 mg by mouth every 6 (six) hours.   Marland Kitchen aspirin EC 81 MG tablet Take 81 mg by mouth daily. Swallow whole.  . carboxymethylcellulose (REFRESH PLUS) 0.5 % SOLN Place 1 drop into both eyes 4 (four) times daily.  . digoxin (LANOXIN) 0.125 MG tablet Take 1 tablet by mouth once a day (HOLD FOR AP UNDER 60)  . docusate (COLACE) 50 MG/5ML liquid Take 100 mg by mouth every other day.  . furosemide (LASIX) 20 MG tablet Take 20 mg by mouth daily.  Marland Kitchen guaiFENesin-dextromethorphan (ROBITUSSIN DM) 100-10 MG/5ML syrup Take 15 mLs by mouth every 6 (six) hours as needed for cough. For cough/congestion. May give up to 48 hrs. Inform physician immediately if cough or congestion is associated with fever or SOB  . HYDROcodone-acetaminophen (HYCET) 7.5-325 mg/15 ml solution Take 7.5 mLs by mouth every 6 (six) hours. Monitor patient closely for allergic response may hold for sedation for pain due to breast cancer  . LANTUS SOLOSTAR 100 UNIT/ML Solostar Pen Inject 20 Units into the skin at bedtime.   . memantine (NAMENDA) 10 MG tablet Take 10 mg by mouth 2 (two) times daily.  . metolazone (ZAROXOLYN) 5 MG tablet Take 5 mg by mouth daily. On Monday and Friday   . metoprolol tartrate (LOPRESSOR) 50 MG tablet Take 50 mg by mouth 2 (two) times daily.  . NON FORMULARY Diet Type:  NAS, consistent CHO, Dysphagia 3 with thin liquids  . NON FORMULARY Give Cranberry Juice twice a day  . omeprazole (PRILOSEC) 40 MG capsule Take 40 mg by mouth daily.  . polyethylene glycol (MIRALAX / GLYCOLAX) packet Take 17 g by mouth daily as needed. daily prn for constipation - mix with 6 oz of liquid and drink  . potassium chloride SA (KLOR-CON) 20 MEQ tablet Take 20 mEq by mouth 4 (four) times daily.   . sitaGLIPtin-metformin (JANUMET) 50-500 MG tablet Take 1 tablet by mouth 2 (two) times daily with a meal.  .  spironolactone (ALDACTONE) 25 MG tablet Take 12.5 mg by mouth daily. 1/2 Tablet = 12.5mg   . traMADol (ULTRAM) 50 MG tablet Take 1 tablet (50 mg total) by mouth 3 (three) times daily. Special Instructions: hold if asleep/don't wake up pt  . [DISCONTINUED] apixaban (ELIQUIS) 5 MG TABS tablet Take 5 mg by mouth 2 (two) times daily.    No facility-administered encounter medications  on file as of 03/09/2020.     SIGNIFICANT DIAGNOSTIC EXAMS   PREVIOUS  02-21-19: DEXA: t score -3.504  NO NEW EXAMS.    LABS REVIEWED PREVIOUS:   04-23-19: wbc 9.6; hgb 13.0; hct 41.4; mcv 90.6 plt 280; glucose 187; bun 37 creat 1.36; k+ 3.3; na++ 139; ca 9.3 ;liver normal albumin 3.2 05-01-19: k+ 4.3 05-09-19: k+ 3.3; vit B 12: 480 05-13-19: k+ 3.8 05-31-19: glucose 287; bun 27; creat 1.39; k+ 4.4; na++ 138; ca 9.4 07-04-30: wbc 8.9; hgb 10.7; hct 33.9; mcv 92.1 plt 266; glucose 118; bun 33; creat 1.23; k+ 3.2; na++ 137; ca 9.2 chol 129; ldl 82 trig 125; hdl 22 07-11-19: k+ 3.3 07-16-19: glucose 112; bun 26; crat 1.15; k+ 4.2; na++ 139; ca 9.3   11-20-19: wbc 8.1; hgb 12.0; hct 39.0; mcv 93.1 plt 227; glucose 92; bun 27; creat 1.07; k+ 4.5; na++ 139; ca 9.3 liver normal albumin 3.2  12-09-19: hgb a1c 6.4   01-27-20: urine micro-albumin: 19.6  NO NEW LABS.   Review of Systems  Unable to perform ROS: Dementia (unable to participate )    Physical Exam Constitutional:      General: She is not in acute distress.    Appearance: She is well-developed. She is not diaphoretic.  Neck:     Thyroid: No thyromegaly.  Cardiovascular:     Rate and Rhythm: Normal rate and regular rhythm.     Pulses: Normal pulses.     Heart sounds: Normal heart sounds.  Pulmonary:     Effort: Pulmonary effort is normal. No respiratory distress.     Breath sounds: Normal breath sounds.  Chest:     Comments: Right breast very large mass present breast is discolored is painful is open with scant bleeding present.  Abdominal:      General: Bowel sounds are normal. There is no distension.     Palpations: Abdomen is soft.     Tenderness: There is no abdominal tenderness.  Musculoskeletal:     Cervical back: Neck supple.     Right lower leg: No edema.     Left lower leg: No edema.     Comments: Is able to move all extremities    Lymphadenopathy:     Cervical: No cervical adenopathy.  Skin:    General: Skin is warm and dry.  Neurological:     Mental Status: She is alert. Mental status is at baseline.  Psychiatric:        Mood and Affect: Mood normal.       ASSESSMENT/ PLAN:  TODAY  1. Failure to thrive in adult 2. Malignant neoplasm of outer quadrant right female breast unspecified estrogen receptor status 3. Dementia with behavioral disturbance unspecified dementia type  She is declining; weight loss is an unfortunate but expected outcome will need to have a family care plan meeting this week to further update family and to make future care decisions. Will continue to monitor her status.   MD is aware of resident's narcotic use and is in agreement with current plan of care. We will attempt to wean resident as appropriate.  Ok Edwards NP [facility]  Contact [phone] Monday through Friday 8am- 5pm  After hours call [phone]

## 2020-03-10 DIAGNOSIS — E1140 Type 2 diabetes mellitus with diabetic neuropathy, unspecified: Secondary | ICD-10-CM | POA: Diagnosis not present

## 2020-03-10 DIAGNOSIS — Z741 Need for assistance with personal care: Secondary | ICD-10-CM | POA: Diagnosis not present

## 2020-03-10 DIAGNOSIS — R1310 Dysphagia, unspecified: Secondary | ICD-10-CM | POA: Diagnosis not present

## 2020-03-10 DIAGNOSIS — F0390 Unspecified dementia without behavioral disturbance: Secondary | ICD-10-CM | POA: Diagnosis not present

## 2020-03-11 DIAGNOSIS — R1310 Dysphagia, unspecified: Secondary | ICD-10-CM | POA: Diagnosis not present

## 2020-03-11 DIAGNOSIS — F0390 Unspecified dementia without behavioral disturbance: Secondary | ICD-10-CM | POA: Diagnosis not present

## 2020-03-11 DIAGNOSIS — E1140 Type 2 diabetes mellitus with diabetic neuropathy, unspecified: Secondary | ICD-10-CM | POA: Diagnosis not present

## 2020-03-11 DIAGNOSIS — Z741 Need for assistance with personal care: Secondary | ICD-10-CM | POA: Diagnosis not present

## 2020-03-12 ENCOUNTER — Non-Acute Institutional Stay (SKILLED_NURSING_FACILITY): Payer: Medicare Other | Admitting: Adult Health

## 2020-03-12 ENCOUNTER — Other Ambulatory Visit: Payer: Self-pay | Admitting: Adult Health

## 2020-03-12 ENCOUNTER — Encounter: Payer: Self-pay | Admitting: Adult Health

## 2020-03-12 DIAGNOSIS — F0391 Unspecified dementia with behavioral disturbance: Secondary | ICD-10-CM

## 2020-03-12 DIAGNOSIS — C50511 Malignant neoplasm of lower-outer quadrant of right female breast: Secondary | ICD-10-CM | POA: Diagnosis not present

## 2020-03-12 DIAGNOSIS — R627 Adult failure to thrive: Secondary | ICD-10-CM | POA: Diagnosis not present

## 2020-03-12 DIAGNOSIS — F0390 Unspecified dementia without behavioral disturbance: Secondary | ICD-10-CM | POA: Diagnosis not present

## 2020-03-12 DIAGNOSIS — R1310 Dysphagia, unspecified: Secondary | ICD-10-CM | POA: Diagnosis not present

## 2020-03-12 DIAGNOSIS — E1140 Type 2 diabetes mellitus with diabetic neuropathy, unspecified: Secondary | ICD-10-CM | POA: Diagnosis not present

## 2020-03-12 DIAGNOSIS — Z741 Need for assistance with personal care: Secondary | ICD-10-CM | POA: Diagnosis not present

## 2020-03-12 MED ORDER — HYDROCODONE-ACETAMINOPHEN 7.5-325 MG/15ML PO SOLN: 15.0000 mL | Freq: Four times a day (QID) | ORAL | 0 refills | Status: DC

## 2020-03-12 NOTE — Progress Notes (Signed)
Location:    [facility] Room Number: 113/W Place of Service:  SNF (31)   CODE STATUS: DNR  Allergies  Allergen Reactions   Codeine Sulfate [Codeine] Anaphylaxis and Hives   Morphine And Related Anaphylaxis and Hives   Penicillins Anaphylaxis   Ace Inhibitors Cough    Chief Complaint  Patient presents with   Acute Visit    Care Plan Meeting    HPI:  We have come together for her care plan meeting. Family present. She has had a decline in her status. She has been seen by Dr. Arnoldo Morale this past summer for possible therapeutic mastectomy. He did not recommend the surgery. She has had one fall with no injury. She is having increased difficulty taking her medications. She will decline them at times. She remains total care with her adls; and is incontinent of bladder and bowel. We have discussed her overall prognosis. Her daughter is aware of her poor prognosis; however; at this time she does not want any further medications stopped. Her daughter is willing to bring hospice on board to help with her decision making efforts. She is having increased pain in her right breast. Her daughter would like for her pain medication to be increased. 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   Past Medical History:  Diagnosis Date   Allergy    Atrial fibrillation ([place]) 08/2011   First diagnosed in 08/2011; duration of arrhythmia is uncertain   Bilateral lower extremity edema    Chronic diarrhea    diverticulosis   COPD (chronic obstructive pulmonary disease) (HCC)    Dementia (HCC)    Diabetes mellitus, type 2 ([place])    Diabetic neuropathy   Dyspnea on exertion    pedal edema   Gout    Headache(784.0)    twice weekly   Hyperlipidemia     Hypertension    Osteopenia    DEXA scan 01/2010   Palpitations    Seasonal allergies    Stress incontinence    Vertigo     Past Surgical History:  Procedure Laterality Date   APPENDECTOMY     BREAST BIOPSY  2002   Right   CESAREAN SECTION     x 2   CHOLECYSTECTOMY     COLONOSCOPY  2007   Negative screening study   COLONOSCOPY WITH PROPOFOL N/A 09/18/2017   Procedure: COLONOSCOPY WITH PROPOFOL;  Surgeon: Daneil Dolin, MD;  [facility];  Service: Endoscopy;  Laterality: N/A;  11:00am   HAMMER TOE SURGERY     Bilateral hammer toe amputation   INCISIONAL HERNIA REPAIR     KNEE ARTHROSCOPY W/ MENISCAL REPAIR  2007   Bilateral   POLYPECTOMY  09/18/2017   Procedure: POLYPECTOMY;  Surgeon: Daneil Dolin, MD;  [facility];  Service: Endoscopy;;  colon   UMBILICAL HERNIA REPAIR      Social History   Socioeconomic History   Marital status: Widowed    Spouse name: Not on file   Number of children: 2   Years of education: Not on file   Highest education level: Not on file  Occupational History   Occupation: Engineer, materials  Tobacco Use   Smoking status: Never Smoker   Smokeless tobacco: Never Used  Scientific laboratory technician Use: Never used  Substance and Sexual Activity   Alcohol use: No   Drug use: No   Sexual activity: Not Currently  Other Topics Concern   Not on file  Social History Narrative   Not on file   Social Determinants of Health   Financial Resource Strain: Low Risk    Difficulty of Paying Living Expenses: Not hard at all  Food Insecurity: No Food Insecurity   Worried About Charity fundraiser in the Last Year: Never true   Ran Out of Food in the Last Year: Never true  Transportation Needs: No Transportation Needs   Lack of Transportation (Medical): No   Lack of Transportation (Non-Medical): No  Physical Activity: Inactive   Days of Exercise per Week: 0 days   Minutes of Exercise per Session:  0 min  Stress:    Feeling of Stress : Not on file  Social Connections: Socially Isolated   Frequency of Communication with Friends and Family: Never   Frequency of Social Gatherings with Friends and Family: Never   Attends Religious Services: Never   Marine scientist or Organizations: Yes   Attends Archivist Meetings: Never   Marital Status: Widowed  Human resources officer Violence: Not At Risk   Fear of Current or Ex-Partner: No   Emotionally Abused: No   Physically Abused: No   Sexually Abused: No   Family History  Adopted: Yes      VITAL SIGNS BP (!) 116/58    Pulse 73    Temp (!) 96.7 F (35.9 C)    Resp 20    Ht 5\' 6"  (1.676 m)    Wt 162 lb 6.4 oz (73.7 kg)    SpO2 93%    BMI 26.21 kg/m   Outpatient Encounter Medications as of 03/12/2020  Medication Sig   aspirin EC 81 MG tablet Take 81 mg by mouth daily. Swallow whole.   carboxymethylcellulose (REFRESH PLUS) 0.5 % SOLN Place 1 drop into both eyes 4 (four) times daily.   digoxin (LANOXIN) 0.125 MG tablet Take 1 tablet by mouth once a day (HOLD FOR AP UNDER 60)   docusate (COLACE) 50 MG/5ML liquid Take 100 mg by mouth every other day.   furosemide (LASIX) 20 MG tablet Take 20 mg by mouth daily.   Glucerna (GLUCERNA) LIQD Take 237 mLs by mouth at bedtime.   guaiFENesin-dextromethorphan (ROBITUSSIN DM) 100-10 MG/5ML syrup Take 15 mLs by mouth every 6 (six) hours as needed for cough. For cough/congestion. May give up to 48 hrs. Inform physician immediately if cough or congestion is associated with fever or SOB   HYDROcodone-acetaminophen (HYCET) 7.5-325 mg/15 ml solution Take 7.5 mLs by mouth every 6 (six) hours. Monitor patient closely for allergic response may hold for sedation for pain due to breast cancer   LANTUS SOLOSTAR 100 UNIT/ML Solostar Pen Inject 20 Units into the skin at bedtime.    memantine (NAMENDA) 10 MG tablet Take 10 mg by mouth 2 (two) times daily.   metolazone (ZAROXOLYN) 5  MG tablet Take 5 mg by mouth daily. On Monday and Friday    metoprolol tartrate (LOPRESSOR) 50 MG tablet Take 50 mg by mouth 2 (two) times daily.   NON FORMULARY Diet Change: Regular, thin liquids (NAS, Cons CHO)   NON FORMULARY Give Cranberry Juice twice a day   omeprazole (PRILOSEC) 40 MG capsule Take 40 mg by mouth daily.   polyethylene glycol (MIRALAX / GLYCOLAX) packet Take 17 g by mouth daily as needed. daily prn  for constipation - mix with 6 oz of liquid and drink   potassium chloride SA (KLOR-CON) 20 MEQ tablet Take 20 mEq by mouth 4 (four) times daily.    sitaGLIPtin-metformin (JANUMET) 50-500 MG tablet Take 1 tablet by mouth 2 (two) times daily with a meal.   spironolactone (ALDACTONE) 25 MG tablet Take 12.5 mg by mouth daily. 1/2 Tablet = 12.5mg    traMADol (ULTRAM) 50 MG tablet Take 1 tablet (50 mg total) by mouth 3 (three) times daily. Special Instructions: hold if asleep/don't wake up pt   [DISCONTINUED] acetaminophen (TYLENOL) 325 MG tablet Take 650 mg by mouth every 6 (six) hours.    No facility-administered encounter medications on file as of 03/12/2020.     SIGNIFICANT DIAGNOSTIC EXAMS   PREVIOUS  02-21-19: DEXA: t score -3.504  NO NEW EXAMS.    LABS REVIEWED PREVIOUS:   04-23-19: wbc 9.6; hgb 13.0; hct 41.4; mcv 90.6 plt 280; glucose 187; bun 37 creat 1.36; k+ 3.3; na++ 139; ca 9.3 ;liver normal albumin 3.2 05-01-19: k+ 4.3 05-09-19: k+ 3.3; vit B 12: 480 05-13-19: k+ 3.8 05-31-19: glucose 287; bun 27; creat 1.39; k+ 4.4; na++ 138; ca 9.4 07-04-30: wbc 8.9; hgb 10.7; hct 33.9; mcv 92.1 plt 266; glucose 118; bun 33; creat 1.23; k+ 3.2; na++ 137; ca 9.2 chol 129; ldl 82 trig 125; hdl 22 07-11-19: k+ 3.3 07-16-19: glucose 112; bun 26; crat 1.15; k+ 4.2; na++ 139; ca 9.3   11-20-19: wbc 8.1; hgb 12.0; hct 39.0; mcv 93.1 plt 227; glucose 92; bun 27; creat 1.07; k+ 4.5; na++ 139; ca 9.3 liver normal albumin 3.2  12-09-19: hgb a1c 6.4   01-27-20: urine micro-albumin:  19.6  NO NEW LABS.   Review of Systems  Unable to perform ROS: Dementia (unable to participate )   Physical Exam Constitutional:      General: She is not in acute distress.    Appearance: She is well-developed. She is not diaphoretic.  Neck:     Thyroid: No thyromegaly.  Cardiovascular:     Rate and Rhythm: Normal rate and regular rhythm.     Heart sounds: Normal heart sounds.  Pulmonary:     Effort: Pulmonary effort is normal. No respiratory distress.     Breath sounds: Normal breath sounds.  Chest:     Comments:  Right breast very large mass present breast is discolored is painful is open with scant bleeding present Abdominal:     General: Bowel sounds are normal. There is no distension.     Palpations: Abdomen is soft.     Tenderness: There is no abdominal tenderness.  Musculoskeletal:     Cervical back: Neck supple.     Right lower leg: No edema.     Left lower leg: No edema.     Comments: Is able to move all extremities   Lymphadenopathy:     Cervical: No cervical adenopathy.  Skin:    General: Skin is warm and dry.  Neurological:     Mental Status: She is alert. Mental status is at baseline.  Psychiatric:        Mood and Affect: Mood normal.       ASSESSMENT/ PLAN:  TODAY  1. Failure to thrive in adult 2. 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  3. 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   Will increase hycet to 15 cc every 6 hours Will set up hospice consult Will continue to  monitor her status Will continue her current medications.    MD is aware of resident's narcotic use and is in agreement with current plan of care. We will attempt to wean resident as appropriate.  Ok Edwards NP [facility] Adult Medicine  Contact [phone] Monday through Friday 8am- 5pm  After hours call [phone]

## 2020-03-13 DIAGNOSIS — E1151 Type 2 diabetes mellitus with diabetic peripheral angiopathy without gangrene: Secondary | ICD-10-CM | POA: Diagnosis not present

## 2020-03-13 DIAGNOSIS — B351 Tinea unguium: Secondary | ICD-10-CM | POA: Diagnosis not present

## 2020-03-13 DIAGNOSIS — Z89422 Acquired absence of other left toe(s): Secondary | ICD-10-CM | POA: Diagnosis not present

## 2020-03-13 DIAGNOSIS — Z794 Long term (current) use of insulin: Secondary | ICD-10-CM | POA: Diagnosis not present

## 2020-03-16 DIAGNOSIS — Z741 Need for assistance with personal care: Secondary | ICD-10-CM | POA: Diagnosis not present

## 2020-03-16 DIAGNOSIS — E1140 Type 2 diabetes mellitus with diabetic neuropathy, unspecified: Secondary | ICD-10-CM | POA: Diagnosis not present

## 2020-03-16 DIAGNOSIS — F0390 Unspecified dementia without behavioral disturbance: Secondary | ICD-10-CM | POA: Diagnosis not present

## 2020-03-16 DIAGNOSIS — R1310 Dysphagia, unspecified: Secondary | ICD-10-CM | POA: Diagnosis not present

## 2020-03-17 ENCOUNTER — Other Ambulatory Visit: Payer: Self-pay | Admitting: Adult Health

## 2020-03-17 DIAGNOSIS — F0390 Unspecified dementia without behavioral disturbance: Secondary | ICD-10-CM | POA: Diagnosis not present

## 2020-03-17 DIAGNOSIS — E1140 Type 2 diabetes mellitus with diabetic neuropathy, unspecified: Secondary | ICD-10-CM | POA: Diagnosis not present

## 2020-03-17 DIAGNOSIS — Z741 Need for assistance with personal care: Secondary | ICD-10-CM | POA: Diagnosis not present

## 2020-03-17 DIAGNOSIS — R1310 Dysphagia, unspecified: Secondary | ICD-10-CM | POA: Diagnosis not present

## 2020-03-18 ENCOUNTER — Other Ambulatory Visit: Payer: Self-pay | Admitting: Adult Health

## 2020-03-18 DIAGNOSIS — J449 Chronic obstructive pulmonary disease, unspecified: Secondary | ICD-10-CM | POA: Diagnosis not present

## 2020-03-18 DIAGNOSIS — N182 Chronic kidney disease, stage 2 (mild): Secondary | ICD-10-CM | POA: Diagnosis not present

## 2020-03-18 DIAGNOSIS — F0390 Unspecified dementia without behavioral disturbance: Secondary | ICD-10-CM | POA: Diagnosis not present

## 2020-03-18 DIAGNOSIS — I5042 Chronic combined systolic (congestive) and diastolic (congestive) heart failure: Secondary | ICD-10-CM | POA: Diagnosis not present

## 2020-03-18 DIAGNOSIS — R52 Pain, unspecified: Secondary | ICD-10-CM | POA: Diagnosis not present

## 2020-03-18 DIAGNOSIS — R1310 Dysphagia, unspecified: Secondary | ICD-10-CM | POA: Diagnosis not present

## 2020-03-18 DIAGNOSIS — E1140 Type 2 diabetes mellitus with diabetic neuropathy, unspecified: Secondary | ICD-10-CM | POA: Diagnosis not present

## 2020-03-18 DIAGNOSIS — I480 Paroxysmal atrial fibrillation: Secondary | ICD-10-CM | POA: Diagnosis not present

## 2020-03-18 DIAGNOSIS — C50919 Malignant neoplasm of unspecified site of unspecified female breast: Secondary | ICD-10-CM | POA: Diagnosis not present

## 2020-03-18 MED ORDER — TRAMADOL HCL 50 MG PO TABS: 50.0000 mg | ORAL_TABLET | Freq: Three times a day (TID) | ORAL | 0 refills | Status: DC

## 2020-03-19 DIAGNOSIS — I5042 Chronic combined systolic (congestive) and diastolic (congestive) heart failure: Secondary | ICD-10-CM | POA: Diagnosis not present

## 2020-03-19 DIAGNOSIS — N182 Chronic kidney disease, stage 2 (mild): Secondary | ICD-10-CM | POA: Diagnosis not present

## 2020-03-19 DIAGNOSIS — I480 Paroxysmal atrial fibrillation: Secondary | ICD-10-CM | POA: Diagnosis not present

## 2020-03-19 DIAGNOSIS — C50919 Malignant neoplasm of unspecified site of unspecified female breast: Secondary | ICD-10-CM | POA: Diagnosis not present

## 2020-03-19 DIAGNOSIS — R1310 Dysphagia, unspecified: Secondary | ICD-10-CM | POA: Diagnosis not present

## 2020-03-19 DIAGNOSIS — E1140 Type 2 diabetes mellitus with diabetic neuropathy, unspecified: Secondary | ICD-10-CM | POA: Diagnosis not present

## 2020-03-21 ENCOUNTER — Other Ambulatory Visit: Payer: Self-pay | Admitting: Internal Medicine

## 2020-03-21 DIAGNOSIS — N182 Chronic kidney disease, stage 2 (mild): Secondary | ICD-10-CM | POA: Diagnosis not present

## 2020-03-21 DIAGNOSIS — C50919 Malignant neoplasm of unspecified site of unspecified female breast: Secondary | ICD-10-CM | POA: Diagnosis not present

## 2020-03-21 DIAGNOSIS — R1310 Dysphagia, unspecified: Secondary | ICD-10-CM | POA: Diagnosis not present

## 2020-03-21 DIAGNOSIS — E1140 Type 2 diabetes mellitus with diabetic neuropathy, unspecified: Secondary | ICD-10-CM | POA: Diagnosis not present

## 2020-03-21 DIAGNOSIS — I5042 Chronic combined systolic (congestive) and diastolic (congestive) heart failure: Secondary | ICD-10-CM | POA: Diagnosis not present

## 2020-03-21 DIAGNOSIS — I480 Paroxysmal atrial fibrillation: Secondary | ICD-10-CM | POA: Diagnosis not present

## 2020-03-21 MED ORDER — OXYCODONE HCL 10 MG PO TABS: 10.0000 mg | ORAL_TABLET | ORAL | 0 refills | Status: DC | PRN

## 2020-03-21 MED ORDER — LORAZEPAM 0.5 MG PO TABS: 0.5000 mg | ORAL_TABLET | ORAL | 0 refills | Status: DC | PRN

## 2020-03-21 NOTE — Progress Notes (Signed)
Hospice Nurse called. Norco and tramadol not effective for pain control. Cannot do Roxanol due to her Allergies. Will try Oxycodone tabs which Nurses can crush Also Ativan for restless ness

## 2020-03-23 ENCOUNTER — Other Ambulatory Visit: Payer: Self-pay | Admitting: Adult Health

## 2020-03-23 ENCOUNTER — Encounter: Payer: Self-pay | Admitting: Adult Health

## 2020-03-23 ENCOUNTER — Non-Acute Institutional Stay (SKILLED_NURSING_FACILITY): Payer: Medicare Other | Admitting: Adult Health

## 2020-03-23 DIAGNOSIS — C50511 Malignant neoplasm of lower-outer quadrant of right female breast: Secondary | ICD-10-CM | POA: Diagnosis not present

## 2020-03-23 DIAGNOSIS — F339 Major depressive disorder, recurrent, unspecified: Secondary | ICD-10-CM

## 2020-03-23 DIAGNOSIS — R627 Adult failure to thrive: Secondary | ICD-10-CM

## 2020-03-23 DIAGNOSIS — F0391 Unspecified dementia with behavioral disturbance: Secondary | ICD-10-CM

## 2020-03-23 MED ORDER — OXYCODONE HCL 10 MG/0.5ML PO CONC: 10.0000 mg/kg/h | ORAL | 0 refills | Status: DC | PRN

## 2020-03-23 MED ORDER — LORAZEPAM 2 MG/ML PO CONC: 0.5000 mg | Freq: Four times a day (QID) | ORAL | 0 refills | Status: DC | PRN

## 2020-03-23 NOTE — Progress Notes (Signed)
Location:    [facility] Room Number: 113/W Place of Service:  SNF (31)   CODE STATUS: DNR  Allergies  Allergen Reactions  . Codeine Sulfate [Codeine] Anaphylaxis and Hives  . Morphine And Related Anaphylaxis and Hives  . Penicillins Anaphylaxis  . Ace Inhibitors Cough  . Morphine     Chief Complaint  Patient presents with  . Acute Visit    Agitation    HPI:  Over this past weekend she has increased anxiety with yelling out; cussing; pulling on her hair; and combative with staff. She continues to be followed by hospice care. This change in her behavioral is felt to be related to her pain. She had been on hycet every 6 hours. This was changed over the weekend to oxycodone 10 mg tabs and ativan 0.5 mg tabs. She is having increased difficulty with swallowing her medications. The staff feels that she would do better with liquid forms of her oxycodone and ativan.    Past Medical History:  Diagnosis Date  . Allergy   . Atrial fibrillation ([place]) 08/2011   First diagnosed in 08/2011; duration of arrhythmia is uncertain  . Bilateral lower extremity edema   . Chronic diarrhea    diverticulosis  . COPD (chronic obstructive pulmonary disease) ([place])   . Dementia ([place])   . Diabetes mellitus, type 2 ([facility])    Diabetic neuropathy  . Dyspnea on exertion    pedal edema  . Gout   . Headache(784.0)    twice weekly  . Hyperlipidemia   . Hypertension   . Osteopenia    DEXA scan 01/2010  . Palpitations   . Seasonal allergies   . Stress incontinence   . Vertigo     Past Surgical History:  Procedure Laterality Date  . APPENDECTOMY    . BREAST BIOPSY  2002   Right  . CESAREAN SECTION     x 2  . CHOLECYSTECTOMY    . COLONOSCOPY  2007   Negative screening study  . COLONOSCOPY WITH PROPOFOL N/A 09/18/2017   Procedure: COLONOSCOPY WITH PROPOFOL;  Surgeon: Daneil Dolin, MD;  [facility];  Service: Endoscopy;  Laterality: N/A;  11:00am  . HAMMER TOE  SURGERY     Bilateral hammer toe amputation  . INCISIONAL HERNIA REPAIR    . KNEE ARTHROSCOPY W/ MENISCAL REPAIR  2007   Bilateral  . POLYPECTOMY  09/18/2017   Procedure: POLYPECTOMY;  Surgeon: Daneil Dolin, MD;  [facility];  Service: Endoscopy;;  colon  . UMBILICAL HERNIA REPAIR      Social History   Socioeconomic History  . Marital status: Widowed    Spouse name: Not on file  . Number of children: 2  . Years of education: Not on file  . Highest education level: Not on file  Occupational History  . Occupation: Engineer, materials  Tobacco Use  . Smoking status: Never Smoker  . Smokeless tobacco: Never Used  Vaping Use  . Vaping Use: Never used  Substance and Sexual Activity  . Alcohol use: No  . Drug use: No  . Sexual activity: Not Currently  Other Topics Concern  . Not on file  Social History Narrative  . Not on file   Social Determinants of Health   Financial Resource Strain: Low Risk   . Difficulty of Paying Living Expenses: Not hard at all  Food Insecurity: No Food Insecurity  . Worried About Charity fundraiser in the Last  Year: Never true  . Ran Out of Food in the Last Year: Never true  Transportation Needs: No Transportation Needs  . Lack of Transportation (Medical): No  . Lack of Transportation (Non-Medical): No  Physical Activity: Inactive  . Days of Exercise per Week: 0 days  . Minutes of Exercise per Session: 0 min  Stress:   . Feeling of Stress : Not on file  Social Connections: Socially Isolated  . Frequency of Communication with Friends and Family: Never  . Frequency of Social Gatherings with Friends and Family: Never  . Attends Religious Services: Never  . Active Member of Clubs or Organizations: Yes  . Attends Archivist Meetings: Never  . Marital Status: Widowed  Intimate Partner Violence: Not At Risk  . Fear of Current or Ex-Partner: No  . Emotionally Abused: No  . Physically Abused: No  . Sexually Abused: No    Family History  Adopted: Yes      VITAL SIGNS BP (!) 109/58   Pulse 72   Temp 98 F (36.7 C)   Resp 20   Ht 5\' 6"  (1.676 m)   Wt 169 lb (76.7 kg)   SpO2 98%   BMI 27.28 kg/m   Outpatient Encounter Medications as of 03/23/2020  Medication Sig  . aspirin EC 81 MG tablet Take 81 mg by mouth daily. Swallow whole.  . carboxymethylcellulose (REFRESH PLUS) 0.5 % SOLN Place 1 drop into both eyes 4 (four) times daily.  . digoxin (LANOXIN) 0.125 MG tablet Take 1 tablet by mouth once a day (HOLD FOR AP UNDER 60)  . docusate (COLACE) 50 MG/5ML liquid Take 100 mg by mouth every other day.  . furosemide (LASIX) 20 MG tablet Take 20 mg by mouth daily.  . Glucerna (GLUCERNA) LIQD Take 237 mLs by mouth at bedtime.  Marland Kitchen guaiFENesin-dextromethorphan (ROBITUSSIN DM) 100-10 MG/5ML syrup Take 15 mLs by mouth every 6 (six) hours as needed for cough. For cough/congestion. May give up to 48 hrs. Inform physician immediately if cough or congestion is associated with fever or SOB  . LANTUS SOLOSTAR 100 UNIT/ML Solostar Pen Inject 20 Units into the skin at bedtime.   Marland Kitchen LORazepam (ATIVAN) 0.5 MG tablet Take 1 tablet (0.5 mg total) by mouth every 4 (four) hours as needed for anxiety.  . memantine (NAMENDA) 10 MG tablet Take 10 mg by mouth 2 (two) times daily.  . metolazone (ZAROXOLYN) 5 MG tablet Take 5 mg by mouth daily. On Monday and Friday   . metoprolol tartrate (LOPRESSOR) 50 MG tablet Take 50 mg by mouth 2 (two) times daily.  . NON FORMULARY Diet Change: Regular, thin liquids (NAS, Cons CHO)  . NON FORMULARY Give Cranberry Juice twice a day  . omeprazole (PRILOSEC) 40 MG capsule Take 40 mg by mouth daily.  . Oxycodone HCl 10 MG TABS Take 1 tablet (10 mg total) by mouth every 4 (four) hours as needed.  . polyethylene glycol (MIRALAX / GLYCOLAX) packet Take 17 g by mouth daily as needed. daily prn for constipation - mix with 6 oz of liquid and drink  . potassium chloride SA (KLOR-CON) 20 MEQ tablet Take  20 mEq by mouth 4 (four) times daily.   . sitaGLIPtin-metformin (JANUMET) 50-500 MG tablet Take 1 tablet by mouth 2 (two) times daily with a meal.  . spironolactone (ALDACTONE) 25 MG tablet Take 12.5 mg by mouth daily. 1/2 Tablet = 12.5mg   . [DISCONTINUED] HYDROcodone-acetaminophen (HYCET) 7.5-325 mg/15 ml solution Take 15 mLs by  mouth every 6 (six) hours. Monitor patient closely for allergic response may hold for sedation for pain due to breast cancer  . [DISCONTINUED] traMADol (ULTRAM) 50 MG tablet Take 1 tablet (50 mg total) by mouth 3 (three) times daily. Special Instructions: hold if asleep/don't wake up pt   No facility-administered encounter medications on file as of 03/23/2020.     SIGNIFICANT DIAGNOSTIC EXAMS  PREVIOUS  02-21-19: DEXA: t score -3.504  NO NEW EXAMS.    LABS REVIEWED PREVIOUS:   04-23-19: wbc 9.6; hgb 13.0; hct 41.4; mcv 90.6 plt 280; glucose 187; bun 37 creat 1.36; k+ 3.3; na++ 139; ca 9.3 ;liver normal albumin 3.2 05-01-19: k+ 4.3 05-09-19: k+ 3.3; vit B 12: 480 05-13-19: k+ 3.8 05-31-19: glucose 287; bun 27; creat 1.39; k+ 4.4; na++ 138; ca 9.4 07-04-30: wbc 8.9; hgb 10.7; hct 33.9; mcv 92.1 plt 266; glucose 118; bun 33; creat 1.23; k+ 3.2; na++ 137; ca 9.2 chol 129; ldl 82 trig 125; hdl 22 07-11-19: k+ 3.3 07-16-19: glucose 112; bun 26; crat 1.15; k+ 4.2; na++ 139; ca 9.3   11-20-19: wbc 8.1; hgb 12.0; hct 39.0; mcv 93.1 plt 227; glucose 92; bun 27; creat 1.07; k+ 4.5; na++ 139; ca 9.3 liver normal albumin 3.2  12-09-19: hgb a1c 6.4   01-27-20: urine micro-albumin: 19.6  TODAY  02-27-20: digoxin 1.1    Review of Systems  Unable to perform ROS: Dementia (unable to participate )    Physical Exam Constitutional:      General: She is not in acute distress.    Appearance: She is well-developed. She is not diaphoretic.  Neck:     Thyroid: No thyromegaly.  Cardiovascular:     Rate and Rhythm: Normal rate and regular rhythm.     Pulses: Normal pulses.      Heart sounds: Normal heart sounds.  Pulmonary:     Effort: Pulmonary effort is normal. No respiratory distress.     Breath sounds: Normal breath sounds.  Chest:     Comments:  Right breast very large mass present breast is discolored is painful is open with scant bleeding present Abdominal:     General: Bowel sounds are normal. There is no distension.     Palpations: Abdomen is soft.     Tenderness: There is no abdominal tenderness.  Musculoskeletal:     Cervical back: Neck supple.     Right lower leg: No edema.     Left lower leg: No edema.     Comments: Is able to move all extremities   Lymphadenopathy:     Cervical: No cervical adenopathy.  Skin:    General: Skin is warm and dry.  Neurological:     Mental Status: She is alert. Mental status is at baseline.  Psychiatric:     Comments: Is restless        ASSESSMENT/ PLAN:  TODAY  1. Failure to thrive in adult 2. 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 Malignant neoplasm of lower outer quadrant of right female breast unspecified estrogen receptor status 4. Major depression recurrent.   Will stop oxycodone 10 mg and ativan 0.5 mg tabs Will begin  oxyfast 10 mg every 4 hours as needed Ativan concentrate 0.5 mg every 6 hours as needed Will continue to monitor her status.     MD is aware of resident's narcotic use and is in agreement with current plan of care. We will attempt to wean resident as appropriate.  Ok Edwards NP [facility]  Adult Medicine  Contact [phone] Monday through Friday 8am- 5pm  After hours call [phone]

## 2020-03-24 ENCOUNTER — Encounter: Payer: Self-pay | Admitting: Adult Health

## 2020-03-24 ENCOUNTER — Non-Acute Institutional Stay (SKILLED_NURSING_FACILITY): Payer: Medicare Other | Admitting: Adult Health

## 2020-03-24 DIAGNOSIS — I480 Paroxysmal atrial fibrillation: Secondary | ICD-10-CM | POA: Diagnosis not present

## 2020-03-24 DIAGNOSIS — R627 Adult failure to thrive: Secondary | ICD-10-CM

## 2020-03-24 DIAGNOSIS — F331 Major depressive disorder, recurrent, moderate: Secondary | ICD-10-CM | POA: Diagnosis not present

## 2020-03-24 DIAGNOSIS — F0391 Unspecified dementia with behavioral disturbance: Secondary | ICD-10-CM

## 2020-03-24 DIAGNOSIS — C50511 Malignant neoplasm of lower-outer quadrant of right female breast: Secondary | ICD-10-CM

## 2020-03-24 DIAGNOSIS — I129 Hypertensive chronic kidney disease with stage 1 through stage 4 chronic kidney disease, or unspecified chronic kidney disease: Secondary | ICD-10-CM | POA: Diagnosis not present

## 2020-03-24 DIAGNOSIS — E1122 Type 2 diabetes mellitus with diabetic chronic kidney disease: Secondary | ICD-10-CM | POA: Diagnosis not present

## 2020-03-24 DIAGNOSIS — R1310 Dysphagia, unspecified: Secondary | ICD-10-CM | POA: Diagnosis not present

## 2020-03-24 DIAGNOSIS — N182 Chronic kidney disease, stage 2 (mild): Secondary | ICD-10-CM

## 2020-03-24 DIAGNOSIS — E1140 Type 2 diabetes mellitus with diabetic neuropathy, unspecified: Secondary | ICD-10-CM | POA: Diagnosis not present

## 2020-03-24 DIAGNOSIS — C50919 Malignant neoplasm of unspecified site of unspecified female breast: Secondary | ICD-10-CM | POA: Diagnosis not present

## 2020-03-24 DIAGNOSIS — I5042 Chronic combined systolic (congestive) and diastolic (congestive) heart failure: Secondary | ICD-10-CM | POA: Diagnosis not present

## 2020-03-24 NOTE — Progress Notes (Signed)
Location:    [facility] Room Number: 113-W Place of Service:  SNF (31)   CODE STATUS: DNR  Allergies  Allergen Reactions  . Codeine Sulfate [Codeine] Anaphylaxis and Hives  . Morphine And Related Anaphylaxis and Hives  . Penicillins Anaphylaxis  . Ace Inhibitors Cough  . Morphine     Chief Complaint  Patient presents with  . 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 She has been started on prn oxyfast and ativan for her pain and anxiety management. She is able to rest quietly. Her blood pressure readings are soft and will need her lopressor lowered. She continues to lose weight and is on supplements.    Past Medical History:  Diagnosis Date  . Allergy   . Atrial fibrillation ([place]) 08/2011   First diagnosed in 08/2011; duration of arrhythmia is uncertain  . Bilateral lower extremity edema   . Chronic diarrhea    diverticulosis  . COPD (chronic obstructive pulmonary disease) ([place])   . Dementia ([place])   . Diabetes mellitus, type 2 (HCC)    Diabetic neuropathy  . Dyspnea on exertion    pedal edema  . Gout   . Headache(784.0)    twice weekly  . Hyperlipidemia   . Hypertension   . Osteopenia    DEXA scan 01/2010  . Palpitations   . Seasonal  allergies   . Stress incontinence   . Vertigo     Past Surgical History:  Procedure Laterality Date  . APPENDECTOMY    . BREAST BIOPSY  2002   Right  . CESAREAN SECTION     x 2  . CHOLECYSTECTOMY    . COLONOSCOPY  2007   Negative screening study  . COLONOSCOPY WITH PROPOFOL N/A 09/18/2017   Procedure: COLONOSCOPY WITH PROPOFOL;  Surgeon: Daneil Dolin, MD;  [facility];  Service: Endoscopy;  Laterality: N/A;  11:00am  . HAMMER TOE SURGERY     Bilateral hammer toe amputation  . INCISIONAL HERNIA REPAIR    . KNEE ARTHROSCOPY W/ MENISCAL REPAIR  2007   Bilateral  . POLYPECTOMY  09/18/2017   Procedure: POLYPECTOMY;  Surgeon: Daneil Dolin, MD;  [facility];  Service: Endoscopy;;  colon  . UMBILICAL HERNIA REPAIR      Social History   Socioeconomic History  . Marital status: Widowed    Spouse name: Not on file  . Number of children: 2  . Years of education: Not on file  . Highest education level: Not on file  Occupational History  . Occupation: Engineer, materials  Tobacco Use  . Smoking status: Never Smoker  . Smokeless tobacco: Never Used  Vaping Use  . Vaping Use: Never used  Substance and Sexual Activity  . Alcohol use: No  . Drug use: No  . Sexual activity: Not Currently  Other Topics Concern  . Not on file  Social History Narrative  . Not on file   Social Determinants of Health   Financial Resource Strain: Low Risk   . Difficulty of Paying Living Expenses: Not hard at all  Food Insecurity: No Food Insecurity  . Worried About Charity fundraiser in the Last Year: Never true  . Ran Out of Food in the Last Year: Never true  Transportation Needs: No Transportation Needs  . Lack of Transportation (Medical): No  . Lack of Transportation (Non-Medical): No  Physical Activity: Inactive  . Days of Exercise per Week: 0 days  . Minutes of Exercise per Session: 0 min  Stress:   . Feeling of Stress : Not on file  Social Connections:  Socially Isolated  . Frequency of Communication with Friends and Family: Never  . Frequency of Social Gatherings with Friends and Family: Never  . Attends Religious Services: Never  . Active Member of Clubs or Organizations: Yes  . Attends Archivist Meetings: Never  . Marital Status: Widowed  Intimate Partner Violence: Not At Risk  . Fear of Current or Ex-Partner: No  . Emotionally Abused: No  . Physically Abused: No  . Sexually Abused: No   Family History  Adopted: Yes      VITAL SIGNS BP (!) 109/58   Pulse 72   Temp (!) 97.3 F (36.3 C)   Resp 18   Ht 5\' 6"  (1.676 m)   Wt 169 lb (76.7 kg)   SpO2 93%   BMI 27.28 kg/m   Outpatient Encounter Medications as of 03/24/2020  Medication Sig  . carboxymethylcellulose (REFRESH PLUS) 0.5 % SOLN Place 1 drop into both eyes 4 (four) times daily.  . digoxin (LANOXIN) 0.125 MG tablet Take 1 tablet by mouth once a day (HOLD FOR AP UNDER 60)  . docusate (COLACE) 50 MG/5ML liquid Take 100 mg by mouth every other day.  . furosemide (LASIX) 20 MG tablet Take 20 mg by mouth daily.  . Glucerna (GLUCERNA) LIQD Take 237 mLs by mouth at bedtime.  Marland Kitchen guaiFENesin-dextromethorphan (ROBITUSSIN DM) 100-10 MG/5ML syrup Take 15 mLs by mouth every 6 (six) hours as needed for cough. For cough/congestion. May give up to 48 hrs. Inform physician immediately if cough or congestion is associated with fever or SOB  . LANTUS SOLOSTAR 100 UNIT/ML Solostar Pen Inject 20 Units into the skin at bedtime.   Marland Kitchen LORazepam (ATIVAN) 2 MG/ML concentrated solution Take 0.5 mg by mouth every 6 (six) hours as needed for anxiety (and agitation).  . memantine (NAMENDA) 10 MG tablet Take 10 mg by mouth 2 (two) times daily.  . metolazone (ZAROXOLYN) 5 MG tablet Take 5 mg by mouth daily. On Monday and Friday   . metoprolol tartrate (LOPRESSOR) 50 MG tablet Take 50 mg by mouth 2 (two) times daily.  . NON FORMULARY Diet Change: Regular, thin liquids (NAS, Cons CHO)  .  NON FORMULARY Give Cranberry Juice twice a day  . omeprazole (PRILOSEC) 40 MG capsule Take 40 mg by mouth daily.  Marland Kitchen oxyCODONE HCl 10 MG/0.5ML CONC Take 10 mg elemental calcium/kg/hr by mouth every 4 (four) hours as needed.  . polyethylene glycol (MIRALAX / GLYCOLAX) packet Take 17 g by mouth daily as needed. daily prn for constipation - mix with 6 oz of liquid and drink  . potassium  chloride (KLOR-CON) 10 MEQ tablet Take 20 mEq by mouth in the morning, at noon, in the evening, and at bedtime. 8 am, 1 pm, 6 pm, and 9 pm  . sitaGLIPtin-metformin (JANUMET) 50-500 MG tablet Take 1 tablet by mouth 2 (two) times daily with a meal.  . spironolactone (ALDACTONE) 25 MG tablet Take 12.5 mg by mouth daily. 1/2 Tablet = 12.5mg   . aspirin EC 81 MG tablet Take 81 mg by mouth daily. Swallow whole.   No facility-administered encounter medications on file as of 03/24/2020.     SIGNIFICANT DIAGNOSTIC EXAMS   PREVIOUS  02-21-19: DEXA: t score -3.504  NO NEW EXAMS.    LABS REVIEWED PREVIOUS:   04-23-19: wbc 9.6; hgb 13.0; hct 41.4; mcv 90.6 plt 280; glucose 187; bun 37 creat 1.36; k+ 3.3; na++ 139; ca 9.3 ;liver normal albumin 3.2 05-01-19: k+ 4.3 05-09-19: k+ 3.3; vit B 12: 480 05-13-19: k+ 3.8 05-31-19: glucose 287; bun 27; creat 1.39; k+ 4.4; na++ 138; ca 9.4 07-04-30: wbc 8.9; hgb 10.7; hct 33.9; mcv 92.1 plt 266; glucose 118; bun 33; creat 1.23; k+ 3.2; na++ 137; ca 9.2 chol 129; ldl 82 trig 125; hdl 22 07-11-19: k+ 3.3 07-16-19: glucose 112; bun 26; crat 1.15; k+ 4.2; na++ 139; ca 9.3   11-20-19: wbc 8.1; hgb 12.0; hct 39.0; mcv 93.1 plt 227; glucose 92; bun 27; creat 1.07; k+ 4.5; na++ 139; ca 9.3 liver normal albumin 3.2  12-09-19: hgb a1c 6.4   01-27-20: urine micro-albumin: 19.6 02-27-20: digoxin 1.1   NO NEW LABS.    Review of Systems  Unable to perform ROS: Dementia (unable to participate )    Physical Exam Constitutional:      General: She is not in acute distress.    Appearance: She is  not diaphoretic.     Comments: thin  Neck:     Thyroid: No thyromegaly.  Cardiovascular:     Rate and Rhythm: Normal rate. Rhythm irregular.     Heart sounds: Normal heart sounds.  Pulmonary:     Effort: Pulmonary effort is normal. No respiratory distress.     Breath sounds: Normal breath sounds.  Chest:     Comments:  Right breast very large mass present breast is discolored is painful  Abdominal:     General: Abdomen is flat. Bowel sounds are normal. There is no distension.     Palpations: Abdomen is soft.     Tenderness: There is no abdominal tenderness.  Musculoskeletal:     Right lower leg: No edema.     Left lower leg: No edema.     Comments: Is able to move all extremities   Lymphadenopathy:     Cervical: No cervical adenopathy.  Skin:    General: Skin is warm and dry.  Neurological:     Comments: Is aware   Psychiatric:     Comments: Is resting quietly       ASSESSMENT/ PLAN:  TODAY:   1. 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 Will continue oxycodone 10 mg every 4 hours as needed and has ativan 0.5 mg every 6 hours as needed for anxiety or agitation.   2. Failure to thrive in adult: she is losing weight: 169 pounds; will continue supplements as directed  3. Dementia with behavioral disturbance unspecified dementia type: is continuing to decline. Is losing weight currently  at 169 pounds; will continue namenda 10 mg twice daily  Weight loss is an unfortunate but expected outcome at the late stages of this disease process   4. 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 Chronic atrial fibrillation is stable will continue digoxin 0.125 mg daily lopressor 50 mg twice daily  for rate control is on long term eliquis 5 mg twice daily   6. Bilateral lower extremity edema: is stable will continue lasix 20 mg daily and aldactone 12.5 mg daily  7. Hypokalemia is stable k+ 4.5 will lower k+ to 20 meq four times daily   8. GERD without esophagitis: is stable will continue prilosec 40 mg daily   9. 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 Major depression recurrent chronic: is stable off medications will monitor   11. Type 2 diabetes mellitus uncontrolled with peripheral circulatory disorder: hgb a1c 6.4 will continue janumet 50/500 mg twice daily basaglar 20 units nightly   12. Dyslipidemia associated with type 2 diabetes mellitus: is stable LDL 125 will monitor   13. CKD stage 2 due to type 2 diabetes mellitus: is stable bun 27 creat 1.07       MD is aware of resident's narcotic use and is in agreement with current plan of care. We will attempt to wean resident as appropriate.  Ok Edwards NP [facility]  Contact [phone] Monday through Friday 8am- 5pm  After hours call [phone]

## 2020-03-25 DIAGNOSIS — I480 Paroxysmal atrial fibrillation: Secondary | ICD-10-CM | POA: Diagnosis not present

## 2020-03-25 DIAGNOSIS — C50919 Malignant neoplasm of unspecified site of unspecified female breast: Secondary | ICD-10-CM | POA: Diagnosis not present

## 2020-03-25 DIAGNOSIS — N182 Chronic kidney disease, stage 2 (mild): Secondary | ICD-10-CM | POA: Diagnosis not present

## 2020-03-25 DIAGNOSIS — R1310 Dysphagia, unspecified: Secondary | ICD-10-CM | POA: Diagnosis not present

## 2020-03-25 DIAGNOSIS — E1140 Type 2 diabetes mellitus with diabetic neuropathy, unspecified: Secondary | ICD-10-CM | POA: Diagnosis not present

## 2020-03-25 DIAGNOSIS — I5042 Chronic combined systolic (congestive) and diastolic (congestive) heart failure: Secondary | ICD-10-CM | POA: Diagnosis not present

## 2020-03-28 NOTE — Progress Notes (Signed)
Received call around 4pm for preprandial supper cbg 452.  Pt now only on Lantus 20 units qhs and novolog with meals had been stopped.  Pt ate very well at breakfast and lunch.  8 units novolog ordered for evening meal.  Junious Dresser, nurse, also requested we resume meal coverage so order for 5 units novolog for cbgs over 200 provided.  Asked them to request Bard Herbert, NP, to reassess CBGs on Monday.

## 2020-03-30 ENCOUNTER — Non-Acute Institutional Stay (SKILLED_NURSING_FACILITY): Payer: Medicare Other | Admitting: Adult Health

## 2020-03-30 ENCOUNTER — Encounter: Payer: Self-pay | Admitting: Adult Health

## 2020-03-30 DIAGNOSIS — R627 Adult failure to thrive: Secondary | ICD-10-CM | POA: Diagnosis not present

## 2020-03-30 DIAGNOSIS — I480 Paroxysmal atrial fibrillation: Secondary | ICD-10-CM | POA: Diagnosis not present

## 2020-03-30 DIAGNOSIS — R0902 Hypoxemia: Secondary | ICD-10-CM | POA: Diagnosis not present

## 2020-03-30 DIAGNOSIS — R279 Unspecified lack of coordination: Secondary | ICD-10-CM | POA: Diagnosis not present

## 2020-03-30 DIAGNOSIS — N182 Chronic kidney disease, stage 2 (mild): Secondary | ICD-10-CM | POA: Diagnosis not present

## 2020-03-30 DIAGNOSIS — C50511 Malignant neoplasm of lower-outer quadrant of right female breast: Secondary | ICD-10-CM

## 2020-03-30 DIAGNOSIS — F0391 Unspecified dementia with behavioral disturbance: Secondary | ICD-10-CM

## 2020-03-30 DIAGNOSIS — R456 Violent behavior: Secondary | ICD-10-CM | POA: Diagnosis not present

## 2020-03-30 DIAGNOSIS — R1310 Dysphagia, unspecified: Secondary | ICD-10-CM | POA: Diagnosis not present

## 2020-03-30 DIAGNOSIS — R404 Transient alteration of awareness: Secondary | ICD-10-CM | POA: Diagnosis not present

## 2020-03-30 DIAGNOSIS — E1140 Type 2 diabetes mellitus with diabetic neuropathy, unspecified: Secondary | ICD-10-CM | POA: Diagnosis not present

## 2020-03-30 DIAGNOSIS — R000 Tachycardia, unspecified: Secondary | ICD-10-CM | POA: Diagnosis not present

## 2020-03-30 DIAGNOSIS — I5042 Chronic combined systolic (congestive) and diastolic (congestive) heart failure: Secondary | ICD-10-CM | POA: Diagnosis not present

## 2020-03-30 DIAGNOSIS — C50919 Malignant neoplasm of unspecified site of unspecified female breast: Secondary | ICD-10-CM | POA: Diagnosis not present

## 2020-03-30 NOTE — Progress Notes (Signed)
Location:    [facility] Room Number: 113/W Place of Service:  SNF (31)    CODE STATUS: DNR  Allergies  Allergen Reactions  . Codeine Sulfate [Codeine] Anaphylaxis and Hives  . Morphine And Related Anaphylaxis and Hives  . Penicillins Anaphylaxis  . Ace Inhibitors Cough  . Morphine     Chief Complaint  Patient presents with  . Discharge Note    Discharge to [facility]    HPI:  She is being discharged to the hospice house. The hospice house will provide all needed medications and dme. Her family is wanting her to be in a different environment for her last days. There have been no known nursing concerns.    Past Medical History:  Diagnosis Date  . Allergy   . Atrial fibrillation ([place]) 08/2011   First diagnosed in 08/2011; duration of arrhythmia is uncertain  . Bilateral lower extremity edema   . Chronic diarrhea    diverticulosis  . COPD (chronic obstructive pulmonary disease) ([place])   . Dementia ([place])   . Diabetes mellitus, type 2 (HCC)    Diabetic neuropathy  . Dyspnea on exertion    pedal edema  . Gout   . Headache(784.0)    twice weekly  . Hyperlipidemia   . Hypertension   . Osteopenia    DEXA scan 01/2010  . Palpitations   . Seasonal allergies   . Stress incontinence   . Vertigo     Past Surgical History:  Procedure Laterality Date  . APPENDECTOMY    . BREAST BIOPSY  2002   Right  . CESAREAN SECTION     x 2  . CHOLECYSTECTOMY    . COLONOSCOPY  2007   Negative screening study  . COLONOSCOPY WITH PROPOFOL N/A 09/18/2017   Procedure: COLONOSCOPY WITH PROPOFOL;  Surgeon: Daneil Dolin, MD;  [facility];  Service: Endoscopy;  Laterality: N/A;  11:00am  . HAMMER TOE SURGERY     Bilateral hammer toe amputation  . INCISIONAL HERNIA REPAIR    . KNEE ARTHROSCOPY W/ MENISCAL REPAIR  2007   Bilateral  . POLYPECTOMY  09/18/2017   Procedure: POLYPECTOMY;  Surgeon: Daneil Dolin, MD;  [facility];  Service:  Endoscopy;;  colon  . UMBILICAL HERNIA REPAIR      Social History   Socioeconomic History  . Marital status: Widowed    Spouse name: Not on file  . Number of children: 2  . Years of education: Not on file  . Highest education level: Not on file  Occupational History  . Occupation: Engineer, materials  Tobacco Use  . Smoking status: Never Smoker  . Smokeless tobacco: Never Used  Vaping Use  . Vaping Use: Never used  Substance and Sexual Activity  . Alcohol use: No  . Drug use: No  . Sexual activity: Not Currently  Other Topics Concern  . Not on file  Social History Narrative  . Not on file   Social Determinants of Health   Financial Resource Strain: Low Risk   . Difficulty of Paying Living Expenses: Not hard at all  Food Insecurity: No Food Insecurity  . Worried About Charity fundraiser in the Last Year: Never true  . Ran Out of Food in the Last Year: Never true  Transportation Needs: No Transportation Needs  . Lack of Transportation (Medical): No  . Lack of Transportation (Non-Medical): No  Physical Activity: Inactive  . Days of Exercise per Week:  0 days  . Minutes of Exercise per Session: 0 min  Stress:   . Feeling of Stress : Not on file  Social Connections: Socially Isolated  . Frequency of Communication with Friends and Family: Never  . Frequency of Social Gatherings with Friends and Family: Never  . Attends Religious Services: Never  . Active Member of Clubs or Organizations: Yes  . Attends Archivist Meetings: Never  . Marital Status: Widowed  Intimate Partner Violence: Not At Risk  . Fear of Current or Ex-Partner: No  . Emotionally Abused: No  . Physically Abused: No  . Sexually Abused: No   Family History  Adopted: Yes    VITAL SIGNS BP (!) 109/58   Pulse (!) 45   Temp 98.2 F (36.8 C)   Resp 18   Ht 5\' 6"  (1.676 m)   Wt 169 lb (76.7 kg)   SpO2 93%   BMI 27.28 kg/m   Patient's Medications  New Prescriptions   No medications on  file  Previous Medications   ASPIRIN EC 81 MG TABLET    Take 81 mg by mouth daily. Swallow whole.   CARBOXYMETHYLCELLULOSE (REFRESH PLUS) 0.5 % SOLN    Place 1 drop into both eyes 4 (four) times daily.   DIGOXIN (LANOXIN) 0.125 MG TABLET    Take 1 tablet by mouth once a day (HOLD FOR AP UNDER 60)   DOCUSATE (COLACE) 50 MG/5ML LIQUID    Take 100 mg by mouth every other day.   FUROSEMIDE (LASIX) 20 MG TABLET    Take 20 mg by mouth daily.   GLUCERNA (GLUCERNA) LIQD    Take 237 mLs by mouth at bedtime.   GUAIFENESIN-DEXTROMETHORPHAN (ROBITUSSIN DM) 100-10 MG/5ML SYRUP    Take 15 mLs by mouth every 6 (six) hours as needed for cough. For cough/congestion. May give up to 48 hrs. Inform physician immediately if cough or congestion is associated with fever or SOB   INSULIN ASPART (NOVOLOG FLEXPEN) 100 UNIT/ML FLEXPEN    Inject 5 Units into the skin 3 (three) times daily with meals.   LANTUS SOLOSTAR 100 UNIT/ML SOLOSTAR PEN    Inject 20 Units into the skin at bedtime.    LORAZEPAM (ATIVAN) 2 MG/ML CONCENTRATED SOLUTION    Take 0.5 mg by mouth every 6 (six) hours as needed for anxiety (and agitation).   MEMANTINE (NAMENDA) 10 MG TABLET    Take 10 mg by mouth 2 (two) times daily.   METOLAZONE (ZAROXOLYN) 5 MG TABLET    Take 5 mg by mouth daily. On Monday and Friday    METOPROLOL TARTRATE (LOPRESSOR) 50 MG TABLET    Take 25 mg by mouth 2 (two) times daily.    NON FORMULARY    Diet Change: Regular, thin liquids (NAS, Cons CHO)   NON FORMULARY    Give Cranberry Juice twice a day   OMEPRAZOLE (PRILOSEC) 40 MG CAPSULE    Take 40 mg by mouth daily.   OXYCODONE (ROXICODONE INTENSOL) 20 MG/ML CONCENTRATED SOLUTION    Take 10 mg by mouth every 4 (four) hours.   POLYETHYLENE GLYCOL (MIRALAX / GLYCOLAX) PACKET    Take 17 g by mouth daily as needed. daily prn for constipation - mix with 6 oz of liquid and drink   POTASSIUM CHLORIDE (KLOR-CON) 10 MEQ TABLET    Take 20 mEq by mouth in the morning, at noon, in the  evening, and at bedtime. 8 am, 1 pm, 6 pm, and 9 pm  SITAGLIPTIN-METFORMIN (JANUMET) 50-500 MG TABLET    Take 1 tablet by mouth 2 (two) times daily with a meal.   SPIRONOLACTONE (ALDACTONE) 25 MG TABLET    Take 12.5 mg by mouth daily. 1/2 Tablet = 12.5mg   Modified Medications   No medications on file  Discontinued Medications   OXYCODONE HCL 10 MG/0.5ML CONC    Take 10 mg elemental calcium/kg/hr by mouth every 4 (four) hours as needed.     SIGNIFICANT DIAGNOSTIC EXAMS   PREVIOUS  02-21-19: DEXA: t score -3.504  NO NEW EXAMS.    LABS REVIEWED PREVIOUS:   04-23-19: wbc 9.6; hgb 13.0; hct 41.4; mcv 90.6 plt 280; glucose 187; bun 37 creat 1.36; k+ 3.3; na++ 139; ca 9.3 ;liver normal albumin 3.2 05-01-19: k+ 4.3 05-09-19: k+ 3.3; vit B 12: 480 05-13-19: k+ 3.8 05-31-19: glucose 287; bun 27; creat 1.39; k+ 4.4; na++ 138; ca 9.4 07-04-30: wbc 8.9; hgb 10.7; hct 33.9; mcv 92.1 plt 266; glucose 118; bun 33; creat 1.23; k+ 3.2; na++ 137; ca 9.2 chol 129; ldl 82 trig 125; hdl 22 07-11-19: k+ 3.3 07-16-19: glucose 112; bun 26; crat 1.15; k+ 4.2; na++ 139; ca 9.3   11-20-19: wbc 8.1; hgb 12.0; hct 39.0; mcv 93.1 plt 227; glucose 92; bun 27; creat 1.07; k+ 4.5; na++ 139; ca 9.3 liver normal albumin 3.2  12-09-19: hgb a1c 6.4   01-27-20: urine micro-albumin: 19.6 02-27-20: digoxin 1.1   NO NEW LABS.   Review of Systems  Unable to perform ROS: Dementia (unable to participate )   Physical Exam Constitutional:      General: She is not in acute distress.    Appearance: She is well-developed. She is not diaphoretic.  Neck:     Thyroid: No thyromegaly.  Cardiovascular:     Rate and Rhythm: Normal rate. Rhythm irregular.     Pulses: Normal pulses.     Heart sounds: Normal heart sounds.  Pulmonary:     Effort: Pulmonary effort is normal. No respiratory distress.     Breath sounds: Normal breath sounds.  Chest:     Comments: Right breast very large mass present breast is discolored is painful    Abdominal:     General: Bowel sounds are normal. There is no distension.     Palpations: Abdomen is soft.     Tenderness: There is no abdominal tenderness.  Musculoskeletal:     Cervical back: Neck supple.     Right lower leg: No edema.     Left lower leg: No edema.  Lymphadenopathy:     Cervical: No cervical adenopathy.  Skin:    General: Skin is warm and dry.  Neurological:     Comments: Is resting quietly        ASSESSMENT/ PLAN:   Patient is being discharged with the following home health services: none   Patient is being discharged with the following durable medical equipment:  None   Patient has been advised to f/u with their PCP in 1-2 weeks to bring them up to date on their rehab stay.  Social services at facility was responsible for arranging this appointment.  Pt was provided with a 30 day supply of prescriptions for medications and refills must be obtained from their PCP.  For controlled substances, a more limited supply may be provided adequate until PCP appointment only.   Medications and dme per hospice    Ok Edwards NP [facility]  Contact [phone] Monday through Friday 8am- 5pm  After hours call [phone]

## 2020-03-31 DIAGNOSIS — N182 Chronic kidney disease, stage 2 (mild): Secondary | ICD-10-CM | POA: Diagnosis not present

## 2020-03-31 DIAGNOSIS — I5042 Chronic combined systolic (congestive) and diastolic (congestive) heart failure: Secondary | ICD-10-CM | POA: Diagnosis not present

## 2020-03-31 DIAGNOSIS — C50919 Malignant neoplasm of unspecified site of unspecified female breast: Secondary | ICD-10-CM | POA: Diagnosis not present

## 2020-03-31 DIAGNOSIS — I480 Paroxysmal atrial fibrillation: Secondary | ICD-10-CM | POA: Diagnosis not present

## 2020-03-31 DIAGNOSIS — R1310 Dysphagia, unspecified: Secondary | ICD-10-CM | POA: Diagnosis not present

## 2020-03-31 DIAGNOSIS — E1140 Type 2 diabetes mellitus with diabetic neuropathy, unspecified: Secondary | ICD-10-CM | POA: Diagnosis not present

## 2020-04-01 DEATH — deceased
